# Patient Record
Sex: Female | Born: 1935 | Race: White | Hispanic: No | Smoking: Former smoker
Health system: Southern US, Community
[De-identification: ages and names within clinical notes are randomized; demographics above are authoritative.]

## PROBLEM LIST (undated history)

## (undated) DIAGNOSIS — R5381 Other malaise: Secondary | ICD-10-CM

## (undated) DIAGNOSIS — I5032 Chronic diastolic (congestive) heart failure: Secondary | ICD-10-CM

## (undated) DIAGNOSIS — M8000XA Age-related osteoporosis with current pathological fracture, unspecified site, initial encounter for fracture: Secondary | ICD-10-CM

## (undated) DIAGNOSIS — M545 Low back pain, unspecified: Secondary | ICD-10-CM

## (undated) DIAGNOSIS — I4892 Unspecified atrial flutter: Secondary | ICD-10-CM

## (undated) DIAGNOSIS — M4714 Other spondylosis with myelopathy, thoracic region: Secondary | ICD-10-CM

## (undated) DIAGNOSIS — S32810A Multiple fractures of pelvis with stable disruption of pelvic ring, initial encounter for closed fracture: Secondary | ICD-10-CM

## (undated) DIAGNOSIS — G959 Disease of spinal cord, unspecified: Secondary | ICD-10-CM

## (undated) DIAGNOSIS — I4891 Unspecified atrial fibrillation: Secondary | ICD-10-CM

## (undated) DIAGNOSIS — S2249XA Multiple fractures of ribs, unspecified side, initial encounter for closed fracture: Secondary | ICD-10-CM

## (undated) DIAGNOSIS — R29898 Other symptoms and signs involving the musculoskeletal system: Secondary | ICD-10-CM

## (undated) DIAGNOSIS — R109 Unspecified abdominal pain: Secondary | ICD-10-CM

## (undated) DIAGNOSIS — S329XXD Fracture of unspecified parts of lumbosacral spine and pelvis, subsequent encounter for fracture with routine healing: Secondary | ICD-10-CM

## (undated) DIAGNOSIS — R5383 Other fatigue: Secondary | ICD-10-CM

## (undated) DIAGNOSIS — M791 Myalgia, unspecified site: Secondary | ICD-10-CM

## (undated) DIAGNOSIS — S81812A Laceration without foreign body, left lower leg, initial encounter: Secondary | ICD-10-CM

## (undated) DIAGNOSIS — M21372 Foot drop, left foot: Secondary | ICD-10-CM

## (undated) DIAGNOSIS — I493 Ventricular premature depolarization: Secondary | ICD-10-CM

## (undated) DIAGNOSIS — S32029A Unspecified fracture of second lumbar vertebra, initial encounter for closed fracture: Secondary | ICD-10-CM

## (undated) DIAGNOSIS — N319 Neuromuscular dysfunction of bladder, unspecified: Secondary | ICD-10-CM

## (undated) DIAGNOSIS — I4819 Other persistent atrial fibrillation: Secondary | ICD-10-CM

## (undated) DIAGNOSIS — R7989 Other specified abnormal findings of blood chemistry: Secondary | ICD-10-CM

## (undated) DIAGNOSIS — S81811A Laceration without foreign body, right lower leg, initial encounter: Secondary | ICD-10-CM

## (undated) DIAGNOSIS — IMO0002 Reserved for concepts with insufficient information to code with codable children: Secondary | ICD-10-CM

## (undated) DIAGNOSIS — I10 Essential (primary) hypertension: Secondary | ICD-10-CM

## (undated) DIAGNOSIS — D72829 Elevated white blood cell count, unspecified: Secondary | ICD-10-CM

## (undated) DIAGNOSIS — E46 Unspecified protein-calorie malnutrition: Secondary | ICD-10-CM

## (undated) DIAGNOSIS — D696 Thrombocytopenia, unspecified: Secondary | ICD-10-CM

## (undated) DIAGNOSIS — I2699 Other pulmonary embolism without acute cor pulmonale: Secondary | ICD-10-CM

## (undated) DIAGNOSIS — I8290 Acute embolism and thrombosis of unspecified vein: Secondary | ICD-10-CM

## (undated) DIAGNOSIS — R55 Syncope and collapse: Secondary | ICD-10-CM

## (undated) DIAGNOSIS — S32000A Wedge compression fracture of unspecified lumbar vertebra, initial encounter for closed fracture: Secondary | ICD-10-CM

## (undated) DIAGNOSIS — I272 Pulmonary hypertension, unspecified: Secondary | ICD-10-CM

## (undated) DIAGNOSIS — S2239XA Fracture of one rib, unspecified side, initial encounter for closed fracture: Secondary | ICD-10-CM

## (undated) DIAGNOSIS — M546 Pain in thoracic spine: Secondary | ICD-10-CM

## (undated) DIAGNOSIS — J90 Pleural effusion, not elsewhere classified: Secondary | ICD-10-CM

## (undated) DIAGNOSIS — M8080XA Other osteoporosis with current pathological fracture, unspecified site, initial encounter for fracture: Secondary | ICD-10-CM

## (undated) DIAGNOSIS — K5903 Drug induced constipation: Secondary | ICD-10-CM

## (undated) DIAGNOSIS — G8929 Other chronic pain: Secondary | ICD-10-CM

## (undated) DIAGNOSIS — E785 Hyperlipidemia, unspecified: Secondary | ICD-10-CM

## (undated) DIAGNOSIS — M21371 Foot drop, right foot: Secondary | ICD-10-CM

## (undated) DIAGNOSIS — Z5181 Encounter for therapeutic drug level monitoring: Secondary | ICD-10-CM

## (undated) DIAGNOSIS — S32001A Stable burst fracture of unspecified lumbar vertebra, initial encounter for closed fracture: Secondary | ICD-10-CM

## (undated) DIAGNOSIS — N3090 Cystitis, unspecified without hematuria: Secondary | ICD-10-CM

## (undated) DIAGNOSIS — K592 Neurogenic bowel, not elsewhere classified: Secondary | ICD-10-CM

## (undated) DIAGNOSIS — K5901 Slow transit constipation: Secondary | ICD-10-CM

## (undated) DIAGNOSIS — I951 Orthostatic hypotension: Secondary | ICD-10-CM

## (undated) DIAGNOSIS — F411 Generalized anxiety disorder: Secondary | ICD-10-CM

## (undated) DIAGNOSIS — E538 Deficiency of other specified B group vitamins: Secondary | ICD-10-CM

## (undated) DIAGNOSIS — E8809 Other disorders of plasma-protein metabolism, not elsewhere classified: Secondary | ICD-10-CM

## (undated) DIAGNOSIS — I491 Atrial premature depolarization: Secondary | ICD-10-CM

## (undated) DIAGNOSIS — F329 Major depressive disorder, single episode, unspecified: Secondary | ICD-10-CM

## (undated) DIAGNOSIS — I119 Hypertensive heart disease without heart failure: Secondary | ICD-10-CM

## (undated) DIAGNOSIS — S32020A Wedge compression fracture of second lumbar vertebra, initial encounter for closed fracture: Secondary | ICD-10-CM

## (undated) DIAGNOSIS — R002 Palpitations: Secondary | ICD-10-CM

## (undated) DIAGNOSIS — R52 Pain, unspecified: Secondary | ICD-10-CM

## (undated) DIAGNOSIS — E440 Moderate protein-calorie malnutrition: Secondary | ICD-10-CM

## (undated) DIAGNOSIS — K629 Disease of anus and rectum, unspecified: Secondary | ICD-10-CM

## (undated) DIAGNOSIS — K5909 Other constipation: Secondary | ICD-10-CM

## (undated) DIAGNOSIS — F32A Depression, unspecified: Secondary | ICD-10-CM

## (undated) DIAGNOSIS — A499 Bacterial infection, unspecified: Secondary | ICD-10-CM

## (undated) DIAGNOSIS — S329XXA Fracture of unspecified parts of lumbosacral spine and pelvis, initial encounter for closed fracture: Secondary | ICD-10-CM

## (undated) DIAGNOSIS — G4762 Sleep related leg cramps: Secondary | ICD-10-CM

## (undated) DIAGNOSIS — R413 Other amnesia: Secondary | ICD-10-CM

## (undated) HISTORY — DX: Other symptoms and signs involving the musculoskeletal system: R29.898

## (undated) HISTORY — DX: Essential (primary) hypertension: I10

## (undated) HISTORY — DX: Other osteoporosis with current pathological fracture, unspecified site, initial encounter for fracture: M80.80XA

## (undated) HISTORY — DX: Other malaise: R53.81

## (undated) HISTORY — DX: Acute embolism and thrombosis of unspecified vein: I82.90

## (undated) HISTORY — DX: Depression, unspecified: F32.A

## (undated) HISTORY — DX: Pain in thoracic spine: M54.6

## (undated) HISTORY — DX: Other pulmonary embolism without acute cor pulmonale: I26.99

## (undated) HISTORY — DX: Other constipation: K59.09

## (undated) HISTORY — DX: Drug induced constipation: K59.03

## (undated) HISTORY — DX: Low back pain, unspecified: M54.50

## (undated) HISTORY — DX: Other fatigue: R53.83

## (undated) HISTORY — DX: Atrial premature depolarization: I49.1

## (undated) HISTORY — DX: Foot drop, right foot: M21.372

## (undated) HISTORY — DX: Palpitations: R00.2

## (undated) HISTORY — DX: Other chronic pain: G89.29

## (undated) HISTORY — DX: Fracture of unspecified parts of lumbosacral spine and pelvis, subsequent encounter for fracture with routine healing: S32.9XXD

## (undated) HISTORY — DX: Pain, unspecified: R52

## (undated) HISTORY — DX: Foot drop, right foot: M21.371

## (undated) HISTORY — DX: Other amnesia: R41.3

## (undated) HISTORY — DX: Reserved for concepts with insufficient information to code with codable children: IMO0002

## (undated) HISTORY — DX: Generalized anxiety disorder: F41.1

## (undated) HISTORY — DX: Thrombocytopenia, unspecified: D69.6

## (undated) HISTORY — DX: Multiple fractures of pelvis with stable disruption of pelvic ring, initial encounter for closed fracture: S32.810A

## (undated) HISTORY — DX: Ventricular premature depolarization: I49.3

## (undated) HISTORY — DX: Unspecified atrial fibrillation: I48.91

## (undated) HISTORY — DX: Other disorders of plasma-protein metabolism, not elsewhere classified: E88.09

## (undated) HISTORY — DX: Unspecified protein-calorie malnutrition: E46

## (undated) HISTORY — DX: Myalgia, unspecified site: M79.10

## (undated) HISTORY — DX: Unspecified atrial flutter: I48.92

## (undated) HISTORY — DX: Laceration without foreign body, right lower leg, initial encounter: S81.811A

## (undated) HISTORY — DX: Age-related osteoporosis with current pathological fracture, unspecified site, initial encounter for fracture: M80.00XA

## (undated) HISTORY — DX: Pulmonary hypertension, unspecified: I27.20

## (undated) HISTORY — DX: Unspecified abdominal pain: R10.9

## (undated) HISTORY — DX: Laceration without foreign body, left lower leg, initial encounter: S81.812A

## (undated) HISTORY — DX: Slow transit constipation: K59.01

---
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
## 1999-09-19 ENCOUNTER — Other Ambulatory Visit: Admission: RE | Admit: 1999-09-19 | Discharge: 1999-09-19 | Payer: Self-pay | Admitting: Obstetrics and Gynecology

## 2000-09-08 ENCOUNTER — Encounter: Payer: Self-pay | Admitting: Obstetrics and Gynecology

## 2000-09-08 ENCOUNTER — Encounter: Admission: RE | Admit: 2000-09-08 | Discharge: 2000-09-08 | Payer: Self-pay | Admitting: Obstetrics and Gynecology

## 2000-09-16 ENCOUNTER — Encounter: Payer: Self-pay | Admitting: Obstetrics and Gynecology

## 2000-09-16 ENCOUNTER — Encounter: Admission: RE | Admit: 2000-09-16 | Discharge: 2000-09-16 | Payer: Self-pay | Admitting: Obstetrics and Gynecology

## 2000-10-28 ENCOUNTER — Encounter: Admission: RE | Admit: 2000-10-28 | Discharge: 2000-10-28 | Payer: Self-pay | Admitting: Obstetrics and Gynecology

## 2000-10-28 ENCOUNTER — Encounter: Payer: Self-pay | Admitting: Obstetrics and Gynecology

## 2001-07-20 ENCOUNTER — Ambulatory Visit (HOSPITAL_COMMUNITY): Admission: RE | Admit: 2001-07-20 | Discharge: 2001-07-20 | Payer: Self-pay | Admitting: Gastroenterology

## 2001-09-22 ENCOUNTER — Encounter: Admission: RE | Admit: 2001-09-22 | Discharge: 2001-09-22 | Payer: Self-pay | Admitting: Obstetrics and Gynecology

## 2001-09-22 ENCOUNTER — Encounter: Payer: Self-pay | Admitting: Obstetrics and Gynecology

## 2002-10-30 ENCOUNTER — Encounter: Admission: RE | Admit: 2002-10-30 | Discharge: 2002-10-30 | Payer: Self-pay | Admitting: Obstetrics and Gynecology

## 2002-10-30 ENCOUNTER — Encounter: Payer: Self-pay | Admitting: Obstetrics and Gynecology

## 2003-11-07 ENCOUNTER — Encounter: Admission: RE | Admit: 2003-11-07 | Discharge: 2003-11-07 | Payer: Self-pay | Admitting: Obstetrics and Gynecology

## 2004-10-21 ENCOUNTER — Emergency Department (HOSPITAL_COMMUNITY): Admission: EM | Admit: 2004-10-21 | Discharge: 2004-10-21 | Payer: Self-pay | Admitting: Family Medicine

## 2004-11-26 ENCOUNTER — Encounter: Admission: RE | Admit: 2004-11-26 | Discharge: 2004-11-26 | Payer: Self-pay | Admitting: Obstetrics and Gynecology

## 2005-11-24 ENCOUNTER — Emergency Department (HOSPITAL_COMMUNITY): Admission: EM | Admit: 2005-11-24 | Discharge: 2005-11-24 | Payer: Self-pay | Admitting: Family Medicine

## 2005-12-14 ENCOUNTER — Emergency Department (HOSPITAL_COMMUNITY): Admission: EM | Admit: 2005-12-14 | Discharge: 2005-12-14 | Payer: Self-pay | Admitting: Family Medicine

## 2006-01-15 ENCOUNTER — Encounter: Admission: RE | Admit: 2006-01-15 | Discharge: 2006-01-15 | Payer: Self-pay | Admitting: Obstetrics and Gynecology

## 2006-06-15 ENCOUNTER — Ambulatory Visit (HOSPITAL_COMMUNITY): Admission: RE | Admit: 2006-06-15 | Discharge: 2006-06-15 | Payer: Self-pay | Admitting: Ophthalmology

## 2007-01-18 ENCOUNTER — Encounter: Admission: RE | Admit: 2007-01-18 | Discharge: 2007-01-18 | Payer: Self-pay | Admitting: Obstetrics and Gynecology

## 2007-06-20 ENCOUNTER — Emergency Department (HOSPITAL_COMMUNITY): Admission: EM | Admit: 2007-06-20 | Discharge: 2007-06-20 | Payer: Self-pay | Admitting: Family Medicine

## 2008-01-20 ENCOUNTER — Encounter: Admission: RE | Admit: 2008-01-20 | Discharge: 2008-01-20 | Payer: Self-pay | Admitting: Obstetrics and Gynecology

## 2009-01-01 ENCOUNTER — Ambulatory Visit (HOSPITAL_COMMUNITY): Admission: RE | Admit: 2009-01-01 | Discharge: 2009-01-01 | Payer: Self-pay | Admitting: Obstetrics and Gynecology

## 2009-01-01 ENCOUNTER — Encounter (INDEPENDENT_AMBULATORY_CARE_PROVIDER_SITE_OTHER): Payer: Self-pay | Admitting: Obstetrics and Gynecology

## 2009-01-01 HISTORY — PX: VULVAR LESION REMOVAL: SHX5391

## 2009-01-21 ENCOUNTER — Encounter: Admission: RE | Admit: 2009-01-21 | Discharge: 2009-01-21 | Payer: Self-pay | Admitting: Obstetrics and Gynecology

## 2009-10-18 ENCOUNTER — Encounter: Admission: RE | Admit: 2009-10-18 | Discharge: 2009-10-18 | Payer: Self-pay | Admitting: Internal Medicine

## 2010-01-23 ENCOUNTER — Encounter: Admission: RE | Admit: 2010-01-23 | Discharge: 2010-01-23 | Payer: Self-pay | Admitting: Obstetrics and Gynecology

## 2010-05-23 ENCOUNTER — Encounter: Admission: RE | Admit: 2010-05-23 | Discharge: 2010-05-23 | Payer: Self-pay | Admitting: Family Medicine

## 2010-05-27 ENCOUNTER — Encounter: Admission: RE | Admit: 2010-05-27 | Discharge: 2010-05-27 | Payer: Self-pay | Admitting: Family Medicine

## 2010-07-09 ENCOUNTER — Ambulatory Visit: Payer: Self-pay | Admitting: Cardiology

## 2010-08-08 ENCOUNTER — Ambulatory Visit: Payer: Self-pay | Admitting: Cardiology

## 2010-09-02 ENCOUNTER — Ambulatory Visit: Payer: Self-pay | Admitting: Cardiology

## 2010-10-03 ENCOUNTER — Ambulatory Visit: Payer: Self-pay | Admitting: Cardiology

## 2010-11-04 ENCOUNTER — Ambulatory Visit: Payer: Self-pay | Admitting: Cardiovascular Disease

## 2010-12-03 ENCOUNTER — Ambulatory Visit: Payer: Self-pay | Admitting: Cardiology

## 2011-01-05 ENCOUNTER — Encounter (INDEPENDENT_AMBULATORY_CARE_PROVIDER_SITE_OTHER): Payer: Medicare Other

## 2011-01-05 DIAGNOSIS — I4891 Unspecified atrial fibrillation: Secondary | ICD-10-CM

## 2011-01-05 DIAGNOSIS — Z7901 Long term (current) use of anticoagulants: Secondary | ICD-10-CM

## 2011-01-28 ENCOUNTER — Other Ambulatory Visit: Payer: Self-pay | Admitting: Dermatology

## 2011-02-02 ENCOUNTER — Other Ambulatory Visit (INDEPENDENT_AMBULATORY_CARE_PROVIDER_SITE_OTHER): Payer: Medicare Other

## 2011-02-02 DIAGNOSIS — Z7901 Long term (current) use of anticoagulants: Secondary | ICD-10-CM

## 2011-02-02 DIAGNOSIS — I4891 Unspecified atrial fibrillation: Secondary | ICD-10-CM

## 2011-03-09 ENCOUNTER — Encounter: Payer: Self-pay | Admitting: Cardiology

## 2011-03-09 DIAGNOSIS — I4892 Unspecified atrial flutter: Secondary | ICD-10-CM | POA: Insufficient documentation

## 2011-03-09 DIAGNOSIS — I493 Ventricular premature depolarization: Secondary | ICD-10-CM | POA: Insufficient documentation

## 2011-03-09 DIAGNOSIS — R5383 Other fatigue: Secondary | ICD-10-CM | POA: Insufficient documentation

## 2011-03-09 DIAGNOSIS — I491 Atrial premature depolarization: Secondary | ICD-10-CM | POA: Insufficient documentation

## 2011-03-09 DIAGNOSIS — R002 Palpitations: Secondary | ICD-10-CM | POA: Insufficient documentation

## 2011-03-09 DIAGNOSIS — M791 Myalgia, unspecified site: Secondary | ICD-10-CM | POA: Insufficient documentation

## 2011-03-09 DIAGNOSIS — R5381 Other malaise: Secondary | ICD-10-CM | POA: Insufficient documentation

## 2011-03-10 ENCOUNTER — Encounter: Payer: Self-pay | Admitting: Cardiology

## 2011-03-10 ENCOUNTER — Ambulatory Visit (INDEPENDENT_AMBULATORY_CARE_PROVIDER_SITE_OTHER): Payer: Medicare Other | Admitting: Cardiology

## 2011-03-10 DIAGNOSIS — I119 Hypertensive heart disease without heart failure: Secondary | ICD-10-CM

## 2011-03-10 DIAGNOSIS — E7800 Pure hypercholesterolemia, unspecified: Secondary | ICD-10-CM

## 2011-03-10 DIAGNOSIS — Z7901 Long term (current) use of anticoagulants: Secondary | ICD-10-CM

## 2011-03-10 DIAGNOSIS — R002 Palpitations: Secondary | ICD-10-CM

## 2011-03-10 DIAGNOSIS — I4892 Unspecified atrial flutter: Secondary | ICD-10-CM

## 2011-03-10 DIAGNOSIS — E785 Hyperlipidemia, unspecified: Secondary | ICD-10-CM | POA: Insufficient documentation

## 2011-03-10 HISTORY — DX: Hypertensive heart disease without heart failure: I11.9

## 2011-03-10 HISTORY — DX: Unspecified atrial flutter: I48.92

## 2011-03-10 HISTORY — DX: Hyperlipidemia, unspecified: E78.5

## 2011-03-10 LAB — POCT INR: INR: 2.6

## 2011-03-10 NOTE — Progress Notes (Signed)
History of Present Illness: This pleasant 77 year old woman is seen for a scheduled 3 month followup office visit.  She has a history of paroxysmal atrial flutter.  She is no longer on amiodarone but she has not reverted back into atrial flutter either.  She walks daily for exercise and has good exercise tolerance and no chest pain or shortness of breath.  She is controlling her diet keep her cholesterol low  Current Outpatient Prescriptions  Medication Sig Dispense Refill  . calcitonin, salmon, (MIACALCIN/FORTICAL) 200 UNIT/ACT nasal spray 1 spray by Nasal route daily.        . Calcium Carbonate-Vitamin D (CALCIUM + D PO) Take by mouth 2 (two) times daily.        . Omega-3 Fatty Acids (FISH OIL PO) Take 2,000 mg by mouth 2 (two) times daily.        . ramipril (ALTACE) 5 MG tablet Take 5 mg by mouth daily.        Marland Kitchen warfarin (COUMADIN) 5 MG tablet Take 5 mg by mouth daily. AS DIRECTED       . DISCONTD: aspirin 81 MG tablet Take 81 mg by mouth daily.         Allergies  Allergen Reactions  . Crestor (Rosuvastatin Calcium)     Patient Active Problem List  Diagnoses  . Paroxysmal atrial flutter  . Palpitations  . PAC (premature atrial contraction)  . PVC's (premature ventricular contractions)  . Malaise  . Fatigue  . Myalgia  . Atrial flutter  . Encounter for long-term (current) use of anticoagulants  . Benign hypertensive heart disease without heart failure  . Hypercholesterolemia    History  Smoking status  . Former Smoker  . Quit date: 03/09/1991  Smokeless tobacco  . Not on file    History  Alcohol Use No    History reviewed. No pertinent family history.  Review of Systems: Constitutional: no fever chills diaphoresis or fatigue or change in weight.  Head and neck: no hearing loss, no epistaxis, no photophobia or visual disturbance. Respiratory: No cough, shortness of breath or wheezing. Cardiovascular: No chest pain peripheral edema, palpitations. Gastrointestinal:  No abdominal distention, no abdominal pain, no change in bowel habits hematochezia or melena. Genitourinary: No dysuria, no frequency, no urgency, no nocturia. Musculoskeletal:No arthralgias, no back pain, no gait disturbance or myalgias. Neurological: No dizziness, no headaches, no numbness, no seizures, no syncope, no weakness, no tremors. Hematologic: No lymphadenopathy, no easy bruising. Psychiatric: No confusion, no hallucinations, no sleep disturbance.    Physical Exam: Filed Vitals:   03/10/11 1022  BP: 110/70  Pulse: 64  Weight is 108 pounds, up 2 pounds.Pupils equal and reactive.   Extraocular Movements are full.  There is no scleral icterus.  The mouth and pharynx are normal.  The neck is supple.  The carotids reveal no bruits.  The jugular venous pressure is normal.  The thyroid is not enlarged.  There is no lymphadenopathy.The chest is clear to percussion and auscultation. There are no rales or rhonchi. Expansion of the chest is symmetrical.The precordium is quiet.  The first heart sound is normal.  The second heart sound is physiologically split.  There is no murmur gallop rub or click.  There is no abnormal lift or heave.The abdomen is soft and nontender. Bowel sounds are normal. The liver and spleen are not enlarged. There Are no abdominal masses. There are no bruits.The pedal pulses are good.  There is no phlebitis or edema.  There is no  cyanosis or clubbing.Strength is normal and symmetrical in all extremities.  There is no lateralizing weakness.  There are no sensory deficits.   Assessment / Plan:  INR is 2.6 and she'll continue same dose.  Recheck for monthly protimes and see for office visit and 3 months

## 2011-03-10 NOTE — Assessment & Plan Note (Signed)
Since last visit the patient has been doing well.  She's not been aware of any rapid heart action or palpitations.  She continues to walk daily for exercise.  She denies chest pain or shortness of breath dizziness or syncope.

## 2011-03-10 NOTE — Assessment & Plan Note (Signed)
Her blood pressure is remaining stable on low dose ramipril 5 mg daily.

## 2011-03-10 NOTE — Assessment & Plan Note (Signed)
The patient has a past history of elevated cholesterol.  She was unable to tolerate Crestor because of leg cramps.  She still has occasional leg cramps off Crestor.  She is trying to watch her diet in regard to her cholesterol intake

## 2011-04-07 ENCOUNTER — Ambulatory Visit (INDEPENDENT_AMBULATORY_CARE_PROVIDER_SITE_OTHER): Payer: Medicare Other | Admitting: *Deleted

## 2011-04-07 DIAGNOSIS — Z7901 Long term (current) use of anticoagulants: Secondary | ICD-10-CM

## 2011-04-07 DIAGNOSIS — I4892 Unspecified atrial flutter: Secondary | ICD-10-CM

## 2011-04-07 LAB — POCT INR: INR: 2.6

## 2011-04-14 NOTE — Op Note (Signed)
NAMEJAMYAH, [name] Weber             ACCOUNT NO.:  [id number]   MEDICAL RECORD NO.:  [id number]          PATIENT TYPE:  AMB   LOCATION:  [facility]                           FACILITY:  [facility]   PHYSICIAN:  Zenaida Niece, M.D.DATE OF BIRTH:  1931/01/20   DATE OF PROCEDURE:  DATE OF DISCHARGE:                               OPERATIVE REPORT   PREOPERATIVE DIAGNOSIS:  Vulvar lesion.   POSTOPERATIVE DIAGNOSIS:  Vulvar lesion.   PROCEDURE:  Removal of vulvar lesion.   SURGEON:  Zenaida Niece, MD   ANESTHESIA:  Local block.   FINDINGS:  She had a 1-cm area of erythema to the right of the urethral  meatus.   SPECIMENS:  Vulvar lesion sent for routine pathology.   ESTIMATED BLOOD LOSS:  Minimal.   COMPLICATIONS:  None.   PROCEDURE IN DETAIL:  The patient was taken to the operating room and  placed in the dorsal supine position.  Legs were then placed in mobile  stirrups.  Perineum and external vagina were then prepped and draped in  usual sterile fashion.  The area was inspected.  Marcaine 0.5% with  epinephrine was then instilled beneath the lesion that was irregular and  just to the right of the urethral meatus.  This achieved good  anesthesia.  The lesion was then removed sharply with a scalpel with  none of the lesion left.  It did appear to be shallow.  The defect was  then closed with interrupted figure-of-eight sutures of 3-0 Vicryl  careful to avoid closing the urethra.  Prior to closure, bleeding was  controlled with electrocautery.  At the end of procedure, the lesion was  hemostatic and well approximated.  The patient tolerated the procedure  well.  She was taken down from the stirrups and taken to the recovery  room in stable condition.  Counts were correct.      Zenaida Niece, M.D.  Electronically Signed     TDM/MEDQ  D:  01/01/2009  T:  01/02/2009  Job:  [id number]

## 2011-04-17 NOTE — Procedures (Signed)
[facility]  Patient:    [name] Weber, [name] Weber Visit Number: [id number] MRN: [id number]          Service Type: END Location: ENDO Attending Physician:  Charna Elizabeth Proc. Date: 07/20/01 Adm. Date:  07/20/2001   CC:         Lilly Cove, M.D.   Procedure Report  DATE OF BIRTH:  October 11, 1931.  PROCEDURE:  Colonoscopy.  ENDOSCOPIST:  Anselmo Rod, M.D.  INSTRUMENT USED:  Pediatric Olympus colonoscope.  INDICATION FOR PROCEDURE:  A 77 year old white female undergoing a screening colonoscopy to rule out colonic polyps, masses, hemorrhoids, etc.  PREPROCEDURE PREPARATION:  Informed consent was procured from the patient. The patient was fasted for eight hours prior to the procedure and prepped with a bottle of magnesium citrate and a gallon of NuLytely the night prior to the procedure.  PREPROCEDURE PHYSICAL:  VITAL SIGNS:  The patient had stable vital signs.  NECK:  Supple.  CHEST:  Clear to auscultation.  S1, S2 regular.  ABDOMEN:  Soft with normal bowel sounds.  DESCRIPTION OF PROCEDURE:  The patient was placed in the left lateral decubitus position and sedated with 60 mg of Demerol and 6 mg of Versed intravenously.  Once the patient was adequately sedate and maintained on low-flow oxygen and continuous cardiac monitoring, the Olympus video colonoscope was advanced from the rectum to the cecum with difficulty, as the patient had a very tortuous and atonic colon.  There were left-sided diverticula seen with small internal hemorrhoids.  No other abnormalities were appreciated.  The procedure was complete up to the cecum.  The ileocecal valve and the appendiceal orifice were clearly visualized and photographed.  IMPRESSION: 1. Left-sided diverticulosis. 2. Small internal hemorrhoids. 3. Very tortuous and atonic colon.  RECOMMENDATIONS: 1. A high-fiber diet has been recommended for the patient. 2. Repeat colorectal cancer  screening is recommended in the next 10 years    unless she were to develop any abnormal symptoms in the interim. 3. Outpatient follow-up is advised on a p.r.n. basis. Attending Physician:  Charna Elizabeth DD:  07/20/01 TD:  07/21/01 Job: [id number]

## 2011-05-05 ENCOUNTER — Ambulatory Visit (INDEPENDENT_AMBULATORY_CARE_PROVIDER_SITE_OTHER): Payer: Medicare Other | Admitting: *Deleted

## 2011-05-05 DIAGNOSIS — I4892 Unspecified atrial flutter: Secondary | ICD-10-CM

## 2011-05-05 DIAGNOSIS — Z7901 Long term (current) use of anticoagulants: Secondary | ICD-10-CM

## 2011-05-05 LAB — POCT INR: INR: 2.8

## 2011-06-01 ENCOUNTER — Ambulatory Visit (INDEPENDENT_AMBULATORY_CARE_PROVIDER_SITE_OTHER): Payer: Medicare Other | Admitting: Cardiology

## 2011-06-01 ENCOUNTER — Encounter: Payer: Self-pay | Admitting: Cardiology

## 2011-06-01 ENCOUNTER — Ambulatory Visit (INDEPENDENT_AMBULATORY_CARE_PROVIDER_SITE_OTHER): Payer: Medicare Other | Admitting: *Deleted

## 2011-06-01 ENCOUNTER — Ambulatory Visit
Admission: RE | Admit: 2011-06-01 | Discharge: 2011-06-01 | Disposition: A | Discharge status: Home / Self Care | Payer: Medicare Other | Source: Ambulatory Visit | Attending: Cardiology | Admitting: Cardiology

## 2011-06-01 DIAGNOSIS — R5383 Other fatigue: Secondary | ICD-10-CM | POA: Insufficient documentation

## 2011-06-01 DIAGNOSIS — R071 Chest pain on breathing: Secondary | ICD-10-CM

## 2011-06-01 DIAGNOSIS — R110 Nausea: Secondary | ICD-10-CM

## 2011-06-01 DIAGNOSIS — R0789 Other chest pain: Secondary | ICD-10-CM

## 2011-06-01 DIAGNOSIS — R350 Frequency of micturition: Secondary | ICD-10-CM

## 2011-06-01 DIAGNOSIS — Z7901 Long term (current) use of anticoagulants: Secondary | ICD-10-CM

## 2011-06-01 DIAGNOSIS — R5381 Other malaise: Secondary | ICD-10-CM

## 2011-06-01 DIAGNOSIS — I4892 Unspecified atrial flutter: Secondary | ICD-10-CM

## 2011-06-01 DIAGNOSIS — R531 Weakness: Secondary | ICD-10-CM

## 2011-06-01 LAB — T4, FREE: Free T4: 0.97 ng/dL (ref 0.60–1.60)

## 2011-06-01 LAB — HEPATIC FUNCTION PANEL
ALT: 20 U/L (ref 0–35)
AST: 25 U/L (ref 0–37)
Albumin: 4 g/dL (ref 3.5–5.2)
Alkaline Phosphatase: 61 U/L (ref 39–117)
Bilirubin, Direct: 0 mg/dL (ref 0.0–0.3)
Total Bilirubin: 0.5 mg/dL (ref 0.3–1.2)
Total Protein: 7.3 g/dL (ref 6.0–8.3)

## 2011-06-01 LAB — BASIC METABOLIC PANEL
BUN: 16 mg/dL (ref 6–23)
CO2: 33 mEq/L — ABNORMAL HIGH (ref 19–32)
Calcium: 9.5 mg/dL (ref 8.4–10.5)
Chloride: 100 mEq/L (ref 96–112)
Creatinine, Ser: 0.8 mg/dL (ref 0.4–1.2)
GFR: 74.89 mL/min (ref >60.00)
Glucose, Bld: 99 mg/dL (ref 70–99)
Potassium: 5.3 mEq/L — ABNORMAL HIGH (ref 3.5–5.1)
Sodium: 138 mEq/L (ref 135–145)

## 2011-06-01 LAB — CBC WITH DIFFERENTIAL/PLATELET
Basophils Absolute: 0 10*3/uL (ref 0.0–0.1)
Basophils Relative: 0.3 % (ref 0.0–3.0)
Eosinophils Absolute: 0 10*3/uL (ref 0.0–0.7)
Eosinophils Relative: 0.6 % (ref 0.0–5.0)
HCT: 43.9 % (ref 36.0–46.0)
Hemoglobin: 14.9 g/dL (ref 12.0–15.0)
Lymphocytes Relative: 23.3 % (ref 12.0–46.0)
Lymphs Abs: 1.7 10*3/uL (ref 0.7–4.0)
MCHC: 34.1 g/dL (ref 30.0–36.0)
MCV: 97.5 fl (ref 78.0–100.0)
Monocytes Absolute: 0.7 10*3/uL (ref 0.1–1.0)
Monocytes Relative: 9.4 % (ref 3.0–12.0)
Neutro Abs: 4.8 10*3/uL (ref 1.4–7.7)
Neutrophils Relative %: 66.4 % (ref 43.0–77.0)
Platelets: 183 10*3/uL (ref 150.0–400.0)
RBC: 4.5 Mil/uL (ref 3.87–5.11)
RDW: 12.5 % (ref 11.5–14.6)
WBC: 7.3 10*3/uL (ref 4.5–10.5)

## 2011-06-01 LAB — TSH: TSH: 1.69 u[IU]/mL (ref 0.35–5.50)

## 2011-06-01 LAB — POCT INR: INR: 3.5

## 2011-06-01 NOTE — Progress Notes (Signed)
[name] Weber Date of Birth:  [birth year] [facility] / [facility] [address]   [address] [place], [place]  [address] [phone]           Fax   [phone]  History of Present Illness: This pleasant 77 year old woman is seen for a work in office visit.  She has a past history of paroxysmal atrial flutter.  She is on Coumadin to prevent stroke.  She's had a history of hypercholesterolemia but was unable to tolerate statins because of myalgias.  She has not had any TIA symptoms.  She does not have any history of ischemic heart disease and she had a normal nuclear stress test on 12/15/02.  Her last echocardiogram was 11/25/06 and showed normal left ventricular systolic and diastolic function with mild aortic sclerosis and mitral valve prolapse with mild mitral regurgitation.  She had normal pulmonary artery pressure of 30.  She was doing well until 4 days ago when she began to have nonspecific symptoms of malaise fatigue arthralgias and poor appetite.  Current Outpatient Prescriptions  Medication Sig Dispense Refill  . aspirin 81 MG tablet Take 81 mg by mouth daily.        . calcitonin, salmon, (MIACALCIN/FORTICAL) 200 UNIT/ACT nasal spray 1 spray by Nasal route daily.        . Calcium Carbonate-Vitamin D (CALCIUM + D PO) Take by mouth 2 (two) times daily.        . Omega-3 Fatty Acids (FISH OIL PO) Take 2,000 mg by mouth 2 (two) times daily.        . ramipril (ALTACE) 5 MG tablet Take 5 mg by mouth daily.        Marland Kitchen warfarin (COUMADIN) 5 MG tablet Take 5 mg by mouth daily. AS DIRECTED         Allergies  Allergen Reactions  . Crestor (Rosuvastatin Calcium)     Patient Active Problem List  Diagnoses  . Paroxysmal atrial flutter  . Palpitations  . PAC (premature atrial contraction)  . PVC's (premature ventricular contractions)  . Malaise  . Fatigue  . Myalgia  . Atrial flutter  . Encounter for long-term (current) use of anticoagulants  . Benign hypertensive heart  disease without heart failure  . Hypercholesterolemia  . Malaise and fatigue    History  Smoking status  . Former Smoker  . Quit date: 03/09/1991  Smokeless tobacco  . Not on file    History  Alcohol Use No    No family history on file.  Review of Systems: Constitutional: Patient has had further weight loss since last visit.  No fever but has felt chilly Head and neck: no hearing loss, no epistaxis, no photophobia or visual disturbance. Respiratory: No cough, shortness of breath or wheezing. Cardiovascular: No chest pain peripheral edema, palpitations. Gastrointestinal: No abdominal distention, no abdominal pain, no change in bowel habits hematochezia or melena.Mild nausea but no vomiting Genitourinary: The patient has had urinary frequency during the day but no increase in nocturia at night Musculoskeletal:No arthralgias, no back pain, no gait disturbance or myalgias. Neurological: No dizziness, no headaches, no numbness, no seizures, no syncope, no weakness, no tremors. Hematologic: No lymphadenopathy, no easy bruising. Psychiatric: No confusion, no hallucinations, no sleep disturbance.    Physical Exam: Filed Vitals:   06/01/11 1030  BP: 110/80  Pulse: 80  The general appearance reveals a well-developed well-nourished thin woman in no distress.The head and neck exam reveals pupils equal and reactive.  Extraocular movements are  full.  There is no scleral icterus.  The mouth and pharynx are normal.  The neck is supple.  The carotids reveal no bruits.  The jugular venous pressure is normal.  The  thyroid is not enlarged.  There is no lymphadenopathy.  The chest is clear to percussion and auscultation.  There are no rales or rhonchi.  Expansion of the chest is symmetrical.  The precordium is quiet.  The first heart sound is normal.  The second heart sound is physiologically split.  There is no murmur gallop rub or click.  There is no abnormal lift or heave.  The abdomen is soft  and nontender.  The bowel sounds are normal.  The liver and spleen are not enlarged.  There are no abdominal masses.  There are no abdominal bruits.  Extremities reveal good pedal pulses.  There is no phlebitis or edema.  There is no cyanosis or clubbing.  Strength is normal and symmetrical in all extremities.  There is no lateralizing weakness.  There are no sensory deficits.  The skin is warm and dry.  There is no rash.   Assessment / Plan: We're checking a chest x-ray today as well as lab work including CBC urinalysis hepatic function panel basal metabolic panel and thyroid function studies since she does complain of feeling nervous and has lost more weight.  We're also checking her protime today.

## 2011-06-01 NOTE — Assessment & Plan Note (Signed)
The patient comes in ahead of schedule because she has not been feeling well for the past 4 days.  She has felt cold.  She has had no definite fever.  She's been having joint aches.  She's had a slight headache.  There is no history of tick bite.  She's been having cramps in her feet.  She's had a poor appetite and has some loose stools but no vomiting.  She has had some slight nausea.  She's had some right-sided chest pain but no left-sided chest pain.  She has felt extremely nervous and anxious.  His not coughing up any sputum.  Her illness started as she was returning home from a family get together in [place].  She is not aware of any other family members who became ill.

## 2011-06-02 ENCOUNTER — Telehealth: Payer: Self-pay | Admitting: Cardiology

## 2011-06-02 LAB — URINALYSIS W MICROSCOPIC + REFLEX CULTURE
Bilirubin Urine: NEGATIVE
Casts: NONE SEEN
Crystals: NONE SEEN
Glucose, UA: NEGATIVE mg/dL
Ketones, ur: NEGATIVE mg/dL
Nitrite: POSITIVE — AB
Protein, ur: NEGATIVE mg/dL
Specific Gravity, Urine: 1.009 (ref 1.005–1.030)
Squamous Epithelial / HPF: NONE SEEN
Urobilinogen, UA: 0.2 mg/dL (ref 0.0–1.0)
pH: 8 (ref 5.0–8.0)

## 2011-06-02 NOTE — Telephone Encounter (Signed)
TEST RESULTS that were done yesterday (06/01/11)

## 2011-06-02 NOTE — Telephone Encounter (Signed)
Message copied by Burnell Blanks on Tue Jun 02, 2011 10:03 AM ------      Message from: Cassell Clement      Created: Mon Jun 01, 2011 10:04 PM       Please report.  The thyroid function studies are normal.The liver function studies are normal.The kidney function studies are normal.White count and hemoglobin are normal and there is no anemia.

## 2011-06-02 NOTE — Telephone Encounter (Signed)
Culture back on urine, positive for nitrates but will wait for sensitivity back before rx anything.  Per patient request

## 2011-06-02 NOTE — Telephone Encounter (Signed)
Message copied by Burnell Blanks on Tue Jun 02, 2011 10:02 AM ------      Message from: Cassell Clement      Created: Mon Jun 01, 2011 10:05 PM       Please report.  The chest x-ray shows mild cardiomegaly and clear lungs and no cause for her symptoms

## 2011-06-02 NOTE — Telephone Encounter (Signed)
Advised of chest xray and labs

## 2011-06-03 LAB — URINE CULTURE: Colony Count: >=100000

## 2011-06-09 ENCOUNTER — Telehealth: Payer: Self-pay | Admitting: *Deleted

## 2011-06-09 DIAGNOSIS — N390 Urinary tract infection, site not specified: Secondary | ICD-10-CM

## 2011-06-09 MED ORDER — AMOXICILLIN 500 MG PO CAPS: 500.0000 mg | ORAL_CAPSULE | Freq: Three times a day (TID) | ORAL | Status: AC

## 2011-06-09 NOTE — Telephone Encounter (Signed)
Message copied by Burnell Blanks on Tue Jun 09, 2011  9:27 AM ------      Message from: Cassell Clement      Created: Mon Jun 08, 2011  5:20 PM       Please report.  The urinalysis and culture show an infection.  We will Rx amoxicillin 500 mg 3 times a day for 5 days #15.  She should force fluids

## 2011-06-09 NOTE — Progress Notes (Signed)
Advised of labs 

## 2011-06-09 NOTE — Telephone Encounter (Signed)
Advised of u/a results, call back if no better

## 2011-06-16 ENCOUNTER — Ambulatory Visit (INDEPENDENT_AMBULATORY_CARE_PROVIDER_SITE_OTHER): Payer: Medicare Other | Admitting: *Deleted

## 2011-06-16 DIAGNOSIS — I4892 Unspecified atrial flutter: Secondary | ICD-10-CM

## 2011-06-16 DIAGNOSIS — Z7901 Long term (current) use of anticoagulants: Secondary | ICD-10-CM

## 2011-06-16 LAB — POCT INR: INR: 2.5

## 2011-07-14 ENCOUNTER — Encounter: Payer: Medicare Other | Admitting: *Deleted

## 2011-07-14 ENCOUNTER — Ambulatory Visit (INDEPENDENT_AMBULATORY_CARE_PROVIDER_SITE_OTHER): Payer: Medicare Other | Admitting: *Deleted

## 2011-07-14 DIAGNOSIS — Z7901 Long term (current) use of anticoagulants: Secondary | ICD-10-CM

## 2011-07-14 DIAGNOSIS — I4892 Unspecified atrial flutter: Secondary | ICD-10-CM

## 2011-07-14 LAB — POCT INR: INR: 2

## 2011-07-15 ENCOUNTER — Telehealth: Payer: Self-pay | Admitting: Cardiology

## 2011-07-15 NOTE — Telephone Encounter (Signed)
Needs latest labs faxed

## 2011-07-15 NOTE — Telephone Encounter (Signed)
Faxed latest labs today

## 2011-07-24 ENCOUNTER — Encounter: Payer: Self-pay | Admitting: Cardiology

## 2011-08-11 ENCOUNTER — Ambulatory Visit (INDEPENDENT_AMBULATORY_CARE_PROVIDER_SITE_OTHER): Payer: Medicare Other | Admitting: *Deleted

## 2011-08-11 DIAGNOSIS — Z7901 Long term (current) use of anticoagulants: Secondary | ICD-10-CM

## 2011-08-11 DIAGNOSIS — I4892 Unspecified atrial flutter: Secondary | ICD-10-CM

## 2011-08-11 LAB — POCT INR: INR: 1.1

## 2011-08-21 ENCOUNTER — Ambulatory Visit (INDEPENDENT_AMBULATORY_CARE_PROVIDER_SITE_OTHER): Payer: Medicare Other | Admitting: *Deleted

## 2011-08-21 DIAGNOSIS — I4892 Unspecified atrial flutter: Secondary | ICD-10-CM

## 2011-08-21 DIAGNOSIS — Z7901 Long term (current) use of anticoagulants: Secondary | ICD-10-CM

## 2011-08-21 LAB — POCT INR: INR: 1.1

## 2011-08-28 ENCOUNTER — Ambulatory Visit (INDEPENDENT_AMBULATORY_CARE_PROVIDER_SITE_OTHER): Payer: Medicare Other | Admitting: *Deleted

## 2011-08-28 DIAGNOSIS — I4892 Unspecified atrial flutter: Secondary | ICD-10-CM

## 2011-08-28 DIAGNOSIS — Z7901 Long term (current) use of anticoagulants: Secondary | ICD-10-CM

## 2011-08-28 LAB — POCT INR: INR: 2

## 2011-09-21 ENCOUNTER — Ambulatory Visit (INDEPENDENT_AMBULATORY_CARE_PROVIDER_SITE_OTHER): Payer: Medicare Other | Admitting: *Deleted

## 2011-09-21 DIAGNOSIS — Z7901 Long term (current) use of anticoagulants: Secondary | ICD-10-CM

## 2011-09-21 DIAGNOSIS — I4892 Unspecified atrial flutter: Secondary | ICD-10-CM

## 2011-09-21 LAB — POCT INR: INR: 2.3

## 2011-10-14 ENCOUNTER — Ambulatory Visit (INDEPENDENT_AMBULATORY_CARE_PROVIDER_SITE_OTHER): Payer: Medicare Other | Admitting: *Deleted

## 2011-10-14 ENCOUNTER — Encounter: Payer: Self-pay | Admitting: Cardiology

## 2011-10-14 ENCOUNTER — Ambulatory Visit (INDEPENDENT_AMBULATORY_CARE_PROVIDER_SITE_OTHER): Payer: Medicare Other | Admitting: Cardiology

## 2011-10-14 VITALS — BP 128/80 | HR 80 | Ht 68.0 in | Wt 109.0 lb

## 2011-10-14 DIAGNOSIS — R002 Palpitations: Secondary | ICD-10-CM

## 2011-10-14 DIAGNOSIS — I4891 Unspecified atrial fibrillation: Secondary | ICD-10-CM

## 2011-10-14 DIAGNOSIS — M810 Age-related osteoporosis without current pathological fracture: Secondary | ICD-10-CM

## 2011-10-14 DIAGNOSIS — I119 Hypertensive heart disease without heart failure: Secondary | ICD-10-CM

## 2011-10-14 DIAGNOSIS — Z7901 Long term (current) use of anticoagulants: Secondary | ICD-10-CM

## 2011-10-14 DIAGNOSIS — I341 Nonrheumatic mitral (valve) prolapse: Secondary | ICD-10-CM

## 2011-10-14 DIAGNOSIS — E7800 Pure hypercholesterolemia, unspecified: Secondary | ICD-10-CM

## 2011-10-14 DIAGNOSIS — I059 Rheumatic mitral valve disease, unspecified: Secondary | ICD-10-CM

## 2011-10-14 DIAGNOSIS — I4892 Unspecified atrial flutter: Secondary | ICD-10-CM

## 2011-10-14 LAB — POCT INR: INR: 2

## 2011-10-14 MED ORDER — CALCITONIN (SALMON) 200 UNIT/ACT NA SOLN: 1.0000 | Freq: Every day | NASAL | Status: DC

## 2011-10-14 NOTE — Assessment & Plan Note (Signed)
She has tried to stay on a prudent low cholesterol diet.  Her weight is up 1 pound since last visit.  He had been concerned about her weight loss.  Since we last saw her she had a colonoscopy by Dr. Loreta Ave which was normal.

## 2011-10-14 NOTE — Patient Instructions (Signed)
Your physician recommends that you continue on your current medications as directed. Please refer to the Current Medication list given to you today. Your physician recommends that you schedule a follow-up appointment in: 4 months with fasting labs (LP/BMET/HFP/EKG)

## 2011-10-14 NOTE — Assessment & Plan Note (Signed)
No recent palpitations or tachycardia.

## 2011-10-14 NOTE — Progress Notes (Signed)
[name] Weber Date of Birth:  1930/12/11 [facility] / [facility] [address]   [address] [place], [place]  [address] [phone]           Fax   [phone]  HPI: This pleasant 77 year old woman is seen for a scheduled followup office visit.  She has a past history of paroxysmal atrial flutter.  She is on long-term Coumadin to prevent stroke.  She's had a history of hypercholesterolemia but cannot take statins because of myalgias.  She has not had any history of TIA symptoms.  She does not have any ischemic heart disease and had a normal nuclear stress test in 2004.  Her last echocardiogram in December 2007 showed normal left ventricular systolic and diastolic function with mild aortic sclerosis and mitral valve prolapse with mild mitral regurgitation.  Current Outpatient Prescriptions  Medication Sig Dispense Refill  . aspirin 81 MG tablet Take 81 mg by mouth daily.        . calcitonin, salmon, (MIACALCIN/FORTICAL) 200 UNIT/ACT nasal spray Place 1 spray into the nose daily.  3.7 mL  3  . Calcium Carbonate-Vitamin D (CALCIUM + D PO) Take by mouth 2 (two) times daily.        . Omega-3 Fatty Acids (FISH OIL PO) Take 2,000 mg by mouth 2 (two) times daily.        . ramipril (ALTACE) 5 MG tablet Take 5 mg by mouth daily.        Marland Kitchen warfarin (COUMADIN) 5 MG tablet Take 5 mg by mouth daily. AS DIRECTED         Allergies  Allergen Reactions  . Crestor (Rosuvastatin Calcium)     Patient Active Problem List  Diagnoses  . Paroxysmal atrial flutter  . Palpitations  . PAC (premature atrial contraction)  . PVC's (premature ventricular contractions)  . Malaise  . Fatigue  . Myalgia  . Atrial flutter  . Encounter for long-term (current) use of anticoagulants  . Benign hypertensive heart disease without heart failure  . Hypercholesterolemia  . Malaise and fatigue    History  Smoking status  . Former Smoker  . Quit date: 03/09/1991  Smokeless tobacco  . Not on file      History  Alcohol Use No    No family history on file.  Review of Systems: The patient denies any heat or cold intolerance.  No weight gain or weight loss.  The patient denies headaches or blurry vision.  There is no cough or sputum production.  The patient denies dizziness.  There is no hematuria or hematochezia.  The patient denies any muscle aches or arthritis.  The patient denies any rash.  The patient denies frequent falling or instability.  There is no history of depression or anxiety.  All other systems were reviewed and are negative.   Physical Exam: Filed Vitals:   10/14/11 1508  BP: 128/80  Pulse: 80   The patient appears to be in no distress.  Head and neck exam reveals that the pupils are equal and reactive.  The extraocular movements are full.  There is no scleral icterus.  Mouth and pharynx are benign.  No lymphadenopathy.  No carotid bruits.  The jugular venous pressure is normal.  Thyroid is not enlarged or tender.  Chest is clear to percussion and auscultation.  No rales or rhonchi.  Expansion of the chest is symmetrical.  Heart reveals no abnormal lift or heave.  First and second heart sounds are normal.  There is no murmur gallop rub and there is a sharp midsystolic apical click.  The abdomen is soft and nontender.  Bowel sounds are normoactive.  There is no hepatosplenomegaly or mass.  There are no abdominal bruits.  Extremities reveal no phlebitis or edema.  Pedal pulses are good.  There is no cyanosis or clubbing.  Neurologic exam is normal strength and no lateralizing weakness.  No sensory deficits.  Integument reveals no rash    Assessment / Plan: Continue same medication.  Recheck in 4 months for office visit EKG and fasting lab work

## 2011-10-14 NOTE — Assessment & Plan Note (Signed)
Her previous malaise and fatigue have improved since last visit.  She was walking outside regularly for exercise now

## 2011-11-25 ENCOUNTER — Ambulatory Visit (INDEPENDENT_AMBULATORY_CARE_PROVIDER_SITE_OTHER): Payer: Medicare Other | Admitting: *Deleted

## 2011-11-25 DIAGNOSIS — Z7901 Long term (current) use of anticoagulants: Secondary | ICD-10-CM

## 2011-11-25 DIAGNOSIS — I4892 Unspecified atrial flutter: Secondary | ICD-10-CM

## 2011-11-25 LAB — POCT INR: INR: 1.7

## 2011-12-10 ENCOUNTER — Ambulatory Visit (INDEPENDENT_AMBULATORY_CARE_PROVIDER_SITE_OTHER): Payer: Medicare Other | Admitting: *Deleted

## 2011-12-10 DIAGNOSIS — Z7901 Long term (current) use of anticoagulants: Secondary | ICD-10-CM

## 2011-12-10 DIAGNOSIS — I4892 Unspecified atrial flutter: Secondary | ICD-10-CM

## 2011-12-10 LAB — POCT INR: INR: 2.3

## 2011-12-31 ENCOUNTER — Ambulatory Visit (INDEPENDENT_AMBULATORY_CARE_PROVIDER_SITE_OTHER): Payer: Medicare Other

## 2011-12-31 DIAGNOSIS — Z7901 Long term (current) use of anticoagulants: Secondary | ICD-10-CM

## 2011-12-31 DIAGNOSIS — I4892 Unspecified atrial flutter: Secondary | ICD-10-CM

## 2011-12-31 LAB — POCT INR: INR: 2.5

## 2012-01-12 ENCOUNTER — Other Ambulatory Visit: Payer: Self-pay | Admitting: Cardiology

## 2012-01-28 ENCOUNTER — Ambulatory Visit (INDEPENDENT_AMBULATORY_CARE_PROVIDER_SITE_OTHER): Payer: Medicare Other | Admitting: Pharmacist

## 2012-01-28 DIAGNOSIS — Z7901 Long term (current) use of anticoagulants: Secondary | ICD-10-CM

## 2012-01-28 DIAGNOSIS — I4892 Unspecified atrial flutter: Secondary | ICD-10-CM

## 2012-01-28 LAB — POCT INR: INR: 2.1

## 2012-02-18 ENCOUNTER — Other Ambulatory Visit (INDEPENDENT_AMBULATORY_CARE_PROVIDER_SITE_OTHER): Payer: Medicare Other

## 2012-02-18 ENCOUNTER — Encounter: Payer: Self-pay | Admitting: Cardiology

## 2012-02-18 ENCOUNTER — Ambulatory Visit (INDEPENDENT_AMBULATORY_CARE_PROVIDER_SITE_OTHER): Payer: Medicare Other | Admitting: Cardiology

## 2012-02-18 VITALS — BP 100/80 | HR 66 | Ht 68.0 in | Wt 106.0 lb

## 2012-02-18 DIAGNOSIS — I493 Ventricular premature depolarization: Secondary | ICD-10-CM

## 2012-02-18 DIAGNOSIS — E7800 Pure hypercholesterolemia, unspecified: Secondary | ICD-10-CM

## 2012-02-18 DIAGNOSIS — I4892 Unspecified atrial flutter: Secondary | ICD-10-CM

## 2012-02-18 DIAGNOSIS — I4949 Other premature depolarization: Secondary | ICD-10-CM

## 2012-02-18 DIAGNOSIS — I119 Hypertensive heart disease without heart failure: Secondary | ICD-10-CM

## 2012-02-18 DIAGNOSIS — I4891 Unspecified atrial fibrillation: Secondary | ICD-10-CM

## 2012-02-18 DIAGNOSIS — I480 Paroxysmal atrial fibrillation: Secondary | ICD-10-CM

## 2012-02-18 LAB — BASIC METABOLIC PANEL
BUN: 20 mg/dL (ref 6–23)
CO2: 31 mEq/L (ref 19–32)
Calcium: 9.2 mg/dL (ref 8.4–10.5)
Chloride: 97 mEq/L (ref 96–112)
Creatinine, Ser: 0.7 mg/dL (ref 0.4–1.2)
GFR: 91.99 mL/min (ref >60.00)
Glucose, Bld: 92 mg/dL (ref 70–99)
Potassium: 4.1 mEq/L (ref 3.5–5.1)
Sodium: 136 mEq/L (ref 135–145)

## 2012-02-18 LAB — HEPATIC FUNCTION PANEL
ALT: 15 U/L (ref 0–35)
AST: 23 U/L (ref 0–37)
Albumin: 3.9 g/dL (ref 3.5–5.2)
Alkaline Phosphatase: 53 U/L (ref 39–117)
Bilirubin, Direct: 0 mg/dL (ref 0.0–0.3)
Total Bilirubin: 0.8 mg/dL (ref 0.3–1.2)
Total Protein: 7.2 g/dL (ref 6.0–8.3)

## 2012-02-18 LAB — LIPID PANEL
Cholesterol: 230 mg/dL — ABNORMAL HIGH (ref 0–200)
HDL: 87.9 mg/dL (ref >39.00)
Total CHOL/HDL Ratio: 3
Triglycerides: 50 mg/dL (ref 0.0–149.0)
VLDL: 10 mg/dL (ref 0.0–40.0)

## 2012-02-18 LAB — LDL CHOLESTEROL, DIRECT: Direct LDL: 126 mg/dL

## 2012-02-18 NOTE — Progress Notes (Signed)
[name] Weber Date of Birth:  04-02-1931 [facility] [address] [place], [place]  [address] [phone]  Fax   [phone]  HPI: This pleasant 78 year old woman is seen for a scheduled four-month followup office visit.  She is a past history of paroxysmal atrial flutter fibrillation.  She is on long-term Coumadin.  Since last visit she's not been aware of any palpitations.  She continues to exercise 4-5 times a week walking between 4 and 5 miles each time.  She is watching her diet and her weight is down 3 pounds.  Current Outpatient Prescriptions  Medication Sig Dispense Refill  . aspirin 81 MG tablet Take 81 mg by mouth daily.        . calcitonin, salmon, (MIACALCIN/FORTICAL) 200 UNIT/ACT nasal spray Place 1 spray into the nose daily.  3.7 mL  3  . Calcium Carbonate-Vitamin D (CALCIUM + D PO) Take by mouth 2 (two) times daily.        . Omega-3 Fatty Acids (FISH OIL PO) Take 2,000 mg by mouth 2 (two) times daily.        . ramipril (ALTACE) 5 MG tablet Take 5 mg by mouth daily.        Marland Kitchen warfarin (COUMADIN) 5 MG tablet Take 5 mg by mouth daily. AS DIRECTED         Allergies  Allergen Reactions  . Crestor (Rosuvastatin Calcium)     Patient Active Problem List  Diagnoses  . Paroxysmal atrial flutter  . Palpitations  . PAC (premature atrial contraction)  . PVC's (premature ventricular contractions)  . Malaise  . Fatigue  . Myalgia  . Atrial flutter  . Encounter for long-term (current) use of anticoagulants  . Benign hypertensive heart disease without heart failure  . Hypercholesterolemia  . Malaise and fatigue    History  Smoking status  . Former Smoker  . Quit date: 03/09/1991  Smokeless tobacco  . Not on file    History  Alcohol Use No    No family history on file.  Review of Systems: The patient denies any heat or cold intolerance.  No weight gain or weight loss.  The patient denies headaches or blurry vision.  There is no cough  or sputum production.  The patient denies dizziness.  There is no hematuria or hematochezia.  The patient denies any muscle aches or arthritis.  The patient denies any rash.  The patient denies frequent falling or instability.  There is no history of depression or anxiety.  All other systems were reviewed and are negative.   Physical Exam: Filed Vitals:   02/18/12 0835  BP: 100/80  Pulse: 66   General appearance reveals a thin elderly woman in no acute distress.Pupils equal and reactive.   Extraocular Movements are full.  There is no scleral icterus.  The mouth and pharynx are normal.  The neck is supple.  The carotids reveal no bruits.  The jugular venous pressure is normal.  The thyroid is not enlarged.  There is no lymphadenopathy.  The chest is clear to percussion and auscultation. There are no rales or rhonchi. Expansion of the chest is symmetrical.  The precordium is quiet.  The first heart sound is normal.  The second heart sound is physiologically split.  There is no murmur gallop rub or click.  There is no abnormal lift or heave.  The abdomen is soft and nontender. Bowel sounds are normal. The liver and spleen are not enlarged. There Are no  abdominal masses. There are no bruits.  Extremities no phlebitis or edema.  EKG today shows sinus bradycardia with occasional PVCs   Assessment / Plan: The patient will continue same medication and recheck in 4 months for followup office visit.  Blood work today pending.

## 2012-02-18 NOTE — Assessment & Plan Note (Signed)
The patient has not been experiencing any awareness of tachypalpitations

## 2012-02-18 NOTE — Assessment & Plan Note (Signed)
The patient has a past history of hypercholesterolemia.  We are checking fasting lab work today.  The patient is not on any statin therapy.  She is attempting to keep her cholesterol down with diet and exercise.

## 2012-02-18 NOTE — Patient Instructions (Signed)
Will obtain labs today and call you with the results (lp/bmet/hfp) Your physician recommends that you continue on your current medications as directed. Please refer to the Current Medication list given to you today. Your physician wants you to follow-up in: 4 months You will receive a reminder letter in the mail two months in advance. If you don't receive a letter, please call our office to schedule the follow-up appointment.   

## 2012-02-19 NOTE — Progress Notes (Signed)
Quick Note:  Please report to patient. The recent labs are stable. Continue same medication and careful diet. LDL 126. HDL remains excellent.Continue careful diet. (Statin intolerant) ______

## 2012-02-22 ENCOUNTER — Telehealth: Payer: Self-pay | Admitting: *Deleted

## 2012-02-22 DIAGNOSIS — I4891 Unspecified atrial fibrillation: Secondary | ICD-10-CM

## 2012-02-22 MED ORDER — WARFARIN SODIUM 5 MG PO TABS: 5.0000 mg | ORAL_TABLET | Freq: Every day | ORAL | Status: DC

## 2012-02-22 NOTE — Telephone Encounter (Signed)
Message copied by Burnell Blanks on Mon Feb 22, 2012 12:12 PM ------      Message from: Cassell Clement      Created: Fri Feb 19, 2012  5:40 AM       Please report to patient.  The recent labs are stable. Continue same medication and careful diet. LDL 126.  HDL remains excellent.Continue careful diet. (Statin intolerant)

## 2012-02-22 NOTE — Telephone Encounter (Signed)
Advised of labs 

## 2012-03-10 ENCOUNTER — Ambulatory Visit (INDEPENDENT_AMBULATORY_CARE_PROVIDER_SITE_OTHER): Payer: Medicare Other | Admitting: *Deleted

## 2012-03-10 DIAGNOSIS — I4892 Unspecified atrial flutter: Secondary | ICD-10-CM

## 2012-03-10 DIAGNOSIS — Z7901 Long term (current) use of anticoagulants: Secondary | ICD-10-CM

## 2012-03-10 LAB — POCT INR: INR: 2

## 2012-04-20 ENCOUNTER — Other Ambulatory Visit: Payer: Self-pay | Admitting: Cardiology

## 2012-04-21 ENCOUNTER — Ambulatory Visit (INDEPENDENT_AMBULATORY_CARE_PROVIDER_SITE_OTHER): Payer: Medicare Other | Admitting: *Deleted

## 2012-04-21 DIAGNOSIS — Z7901 Long term (current) use of anticoagulants: Secondary | ICD-10-CM

## 2012-04-21 DIAGNOSIS — I4892 Unspecified atrial flutter: Secondary | ICD-10-CM

## 2012-04-21 LAB — POCT INR: INR: 2.2

## 2012-06-07 ENCOUNTER — Ambulatory Visit (INDEPENDENT_AMBULATORY_CARE_PROVIDER_SITE_OTHER): Payer: Medicare Other | Admitting: *Deleted

## 2012-06-07 DIAGNOSIS — Z7901 Long term (current) use of anticoagulants: Secondary | ICD-10-CM

## 2012-06-07 DIAGNOSIS — I4892 Unspecified atrial flutter: Secondary | ICD-10-CM

## 2012-06-07 LAB — POCT INR: INR: 2.2

## 2012-07-11 ENCOUNTER — Encounter: Payer: Self-pay | Admitting: *Deleted

## 2012-07-11 ENCOUNTER — Other Ambulatory Visit: Payer: Self-pay | Admitting: Family Medicine

## 2012-07-11 DIAGNOSIS — Z780 Asymptomatic menopausal state: Secondary | ICD-10-CM

## 2012-07-11 DIAGNOSIS — Z1231 Encounter for screening mammogram for malignant neoplasm of breast: Secondary | ICD-10-CM

## 2012-07-12 ENCOUNTER — Encounter: Payer: Self-pay | Admitting: Cardiology

## 2012-07-12 ENCOUNTER — Ambulatory Visit (INDEPENDENT_AMBULATORY_CARE_PROVIDER_SITE_OTHER): Payer: Medicare Other | Admitting: Cardiology

## 2012-07-12 VITALS — BP 108/68 | HR 68 | Ht 68.0 in | Wt 108.8 lb

## 2012-07-12 DIAGNOSIS — R002 Palpitations: Secondary | ICD-10-CM

## 2012-07-12 DIAGNOSIS — E7800 Pure hypercholesterolemia, unspecified: Secondary | ICD-10-CM

## 2012-07-12 NOTE — Progress Notes (Signed)
[name] Weber Date of Birth:  February 06, 1931 [facility] [address] [place], [place]  [address] [phone]  Fax   [phone]  HPI: This pleasant 78 year old woman is seen for a four-month followup office visit.  She has a past history of paroxysmal atrial fibrillation.  She is on long-term Coumadin.  She also has a history of hypercholesterolemia.  Dr. Cyndia Bent is her primary care provider and will be checking her cholesterol next week. Current Outpatient Prescriptions  Medication Sig Dispense Refill  . aspirin 81 MG tablet Take 81 mg by mouth daily.        . calcitonin, salmon, (MIACALCIN/FORTICAL) 200 UNIT/ACT nasal spray Place 1 spray into the nose daily.  3.7 mL  3  . Calcium Carbonate-Vitamin D (CALCIUM + D PO) Take by mouth 2 (two) times daily.        . Omega-3 Fatty Acids (FISH OIL PO) Take 2,000 mg by mouth 2 (two) times daily.        . ramipril (ALTACE) 5 MG capsule TAKE 1 CAPSULE DAILY  90 capsule  3  . warfarin (COUMADIN) 5 MG tablet Take 1 tablet (5 mg total) by mouth daily. AS DIRECTED  90 tablet  3  . DISCONTD: ramipril (ALTACE) 5 MG tablet Take 5 mg by mouth daily.          Allergies  Allergen Reactions  . Crestor (Rosuvastatin Calcium)     Patient Active Problem List  Diagnosis  . Paroxysmal atrial flutter  . Palpitations  . PAC (premature atrial contraction)  . PVC's (premature ventricular contractions)  . Malaise  . Fatigue  . Myalgia  . Atrial flutter  . Encounter for long-term (current) use of anticoagulants  . Benign hypertensive heart disease without heart failure  . Hypercholesterolemia  . Malaise and fatigue    History  Smoking status  . Former Smoker  . Quit date: 03/09/1991  Smokeless tobacco  . Not on file    History  Alcohol Use No    History reviewed. No pertinent family history.  Review of Systems: The patient denies any heat or cold intolerance.  No weight gain or weight loss.  The patient denies  headaches or blurry vision.  There is no cough or sputum production.  The patient denies dizziness.  There is no hematuria or hematochezia.  The patient denies any muscle aches or arthritis.  The patient denies any rash.  The patient denies frequent falling or instability.  There is no history of depression or anxiety.  All other systems were reviewed and are negative.   Physical Exam: Filed Vitals:   07/12/12 1007  BP: 108/68  Pulse: 68   the general appearance reveals a well-developed well-nourished woman in no distress.  She has gained 2 pounds since last visit.The head and neck exam reveals pupils equal and reactive.  Extraocular movements are full.  There is no scleral icterus.  The mouth and pharynx are normal.  The neck is supple.  The carotids reveal no bruits.  The jugular venous pressure is normal.  The  thyroid is not enlarged.  There is no lymphadenopathy.  The chest is clear to percussion and auscultation.  There are no rales or rhonchi.  Expansion of the chest is symmetrical.  The precordium is quiet.  The first heart sound is normal.  The second heart sound is physiologically split.  There is no murmur gallop rub or click.  There is no abnormal lift or heave.  The abdomen  is soft and nontender.  The bowel sounds are normal.  The liver and spleen are not enlarged.  There are no abdominal masses.  There are no abdominal bruits.  Extremities reveal good pedal pulses.  There is no phlebitis or edema.  There is no cyanosis or clubbing.  Strength is normal and symmetrical in all extremities.  There is no lateralizing weakness.  There are no sensory deficits.  The skin is warm and dry.  There is no rash.      Assessment / Plan: Continue same medication.  Recheck in 4 months for followup office visit.  Continue long-term Coumadin.  We talked about no work alternatives to Coumadin such as Xarelto she would prefer to stay with Coumadin and have periodic blood tests to know where she  stands.

## 2012-07-12 NOTE — Assessment & Plan Note (Signed)
She is controlling her cholesterol with diet and exercise at this point

## 2012-07-12 NOTE — Patient Instructions (Addendum)
Your physician recommends that you continue on your current medications as directed. Please refer to the Current Medication list given to you today.  Your physician recommends that you schedule a follow-up appointment in: 4 months  

## 2012-07-12 NOTE — Assessment & Plan Note (Signed)
The patient has been aware of occasional isolated premature beats when she checks her radial pulse.  She has not had any sustained tachycardia or palpitations.  She still walks several miles a day for exercise.  She is not having any chest pain or shortness of breath

## 2012-07-19 ENCOUNTER — Ambulatory Visit (INDEPENDENT_AMBULATORY_CARE_PROVIDER_SITE_OTHER): Payer: Medicare Other | Admitting: *Deleted

## 2012-07-19 DIAGNOSIS — Z7901 Long term (current) use of anticoagulants: Secondary | ICD-10-CM

## 2012-07-19 DIAGNOSIS — I4892 Unspecified atrial flutter: Secondary | ICD-10-CM

## 2012-07-19 LAB — POCT INR: INR: 2

## 2012-07-20 ENCOUNTER — Ambulatory Visit
Admission: RE | Admit: 2012-07-20 | Discharge: 2012-07-20 | Disposition: A | Discharge status: Home / Self Care | Payer: Medicare Other | Source: Ambulatory Visit | Attending: Family Medicine | Admitting: Family Medicine

## 2012-07-20 DIAGNOSIS — Z1231 Encounter for screening mammogram for malignant neoplasm of breast: Secondary | ICD-10-CM

## 2012-07-20 DIAGNOSIS — Z780 Asymptomatic menopausal state: Secondary | ICD-10-CM

## 2012-08-30 ENCOUNTER — Ambulatory Visit (INDEPENDENT_AMBULATORY_CARE_PROVIDER_SITE_OTHER): Payer: Medicare Other | Admitting: *Deleted

## 2012-08-30 DIAGNOSIS — Z7901 Long term (current) use of anticoagulants: Secondary | ICD-10-CM

## 2012-08-30 DIAGNOSIS — I4892 Unspecified atrial flutter: Secondary | ICD-10-CM

## 2012-08-30 LAB — POCT INR: INR: 1.8

## 2012-09-27 ENCOUNTER — Ambulatory Visit (INDEPENDENT_AMBULATORY_CARE_PROVIDER_SITE_OTHER): Payer: Medicare Other

## 2012-09-27 DIAGNOSIS — I4892 Unspecified atrial flutter: Secondary | ICD-10-CM

## 2012-09-27 DIAGNOSIS — Z7901 Long term (current) use of anticoagulants: Secondary | ICD-10-CM

## 2012-09-27 LAB — POCT INR: INR: 3.1

## 2012-10-25 ENCOUNTER — Ambulatory Visit (INDEPENDENT_AMBULATORY_CARE_PROVIDER_SITE_OTHER): Payer: Medicare Other | Admitting: *Deleted

## 2012-10-25 DIAGNOSIS — I4892 Unspecified atrial flutter: Secondary | ICD-10-CM

## 2012-10-25 DIAGNOSIS — Z7901 Long term (current) use of anticoagulants: Secondary | ICD-10-CM

## 2012-10-25 LAB — POCT INR: INR: 2.7

## 2012-11-16 ENCOUNTER — Ambulatory Visit (INDEPENDENT_AMBULATORY_CARE_PROVIDER_SITE_OTHER): Payer: Medicare Other | Admitting: Cardiology

## 2012-11-16 ENCOUNTER — Encounter: Payer: Self-pay | Admitting: Cardiology

## 2012-11-16 ENCOUNTER — Ambulatory Visit (INDEPENDENT_AMBULATORY_CARE_PROVIDER_SITE_OTHER): Payer: Medicare Other | Admitting: *Deleted

## 2012-11-16 VITALS — BP 100/70 | HR 59 | Resp 18 | Ht 68.0 in | Wt 104.6 lb

## 2012-11-16 DIAGNOSIS — Z7901 Long term (current) use of anticoagulants: Secondary | ICD-10-CM

## 2012-11-16 DIAGNOSIS — E7800 Pure hypercholesterolemia, unspecified: Secondary | ICD-10-CM

## 2012-11-16 DIAGNOSIS — I4892 Unspecified atrial flutter: Secondary | ICD-10-CM

## 2012-11-16 DIAGNOSIS — I4891 Unspecified atrial fibrillation: Secondary | ICD-10-CM

## 2012-11-16 DIAGNOSIS — I480 Paroxysmal atrial fibrillation: Secondary | ICD-10-CM

## 2012-11-16 LAB — POCT INR: INR: 3.6

## 2012-11-16 NOTE — Progress Notes (Signed)
[name] Weber Date of Birth:  12/29/1930 [facility] [address] [place], [place]  [address] [phone]  Fax   [phone]  HPI: This pleasant 78 year old woman is seen for a four-month followup office visit. She has a past history of paroxysmal atrial fibrillation. She is on long-term Coumadin. She also has a history of hypercholesterolemia. Dr. Cyndia Bent is her primary care provider and follows her cholesterols.  Since last visit the patient has been experiencing increasing back pain.  She is suspicious that it may be secondary to her Lipitor therapy.   Current Outpatient Prescriptions  Medication Sig Dispense Refill  . aspirin 81 MG tablet Take 81 mg by mouth daily.        . calcitonin, salmon, (MIACALCIN/FORTICAL) 200 UNIT/ACT nasal spray Place 1 spray into the nose daily.  3.7 mL  3  . Calcium Carbonate-Vitamin D (CALCIUM + D PO) Take by mouth 2 (two) times daily.        . Omega-3 Fatty Acids (FISH OIL PO) Take 2,000 mg by mouth 2 (two) times daily.        . ramipril (ALTACE) 5 MG capsule TAKE 1 CAPSULE DAILY  90 capsule  3  . warfarin (COUMADIN) 5 MG tablet Take 1 tablet (5 mg total) by mouth daily. AS DIRECTED  90 tablet  3    Allergies  Allergen Reactions  . Crestor (Rosuvastatin Calcium)     Patient Active Problem List  Diagnosis  . Paroxysmal atrial flutter  . Palpitations  . PAC (premature atrial contraction)  . PVC's (premature ventricular contractions)  . Malaise  . Fatigue  . Myalgia  . Atrial flutter  . Encounter for long-term (current) use of anticoagulants  . Benign hypertensive heart disease without heart failure  . Hypercholesterolemia  . Malaise and fatigue    History  Smoking status  . Former Smoker  . Quit date: 03/09/1991  Smokeless tobacco  . Not on file    History  Alcohol Use No    No family history on file.  Review of Systems: The patient denies any heat or cold intolerance.  No weight gain or weight  loss.  The patient denies headaches or blurry vision.  There is no cough or sputum production.  The patient denies dizziness.  There is no hematuria or hematochezia.  The patient denies any muscle aches or arthritis.  The patient denies any rash.  The patient denies frequent falling or instability.  There is no history of depression or anxiety.  All other systems were reviewed and are negative.   Physical Exam: Filed Vitals:   11/16/12 0841  BP: 100/70  Pulse: 59  Resp: 18   the general appearance reveals a well-developed thin woman in no distress.The head and neck exam reveals pupils equal and reactive.  Extraocular movements are full.  There is no scleral icterus.  The mouth and pharynx are normal.  The neck is supple.  The carotids reveal no bruits.  The jugular venous pressure is normal.  The  thyroid is not enlarged.  There is no lymphadenopathy.  The chest is clear to percussion and auscultation.  There are no rales or rhonchi.  Expansion of the chest is symmetrical.  The precordium is quiet.  The first heart sound is normal.  The second heart sound is physiologically split.  There is no murmur gallop rub or click.  There is no abnormal lift or heave.  The abdomen is soft and nontender.  The bowel sounds  are normal.  The liver and spleen are not enlarged.  There are no abdominal masses.  There are no abdominal bruits.  Extremities reveal good pedal pulses.  There is no phlebitis or edema.  There is no cyanosis or clubbing.  Strength is normal and symmetrical in all extremities.  There is no lateralizing weakness.  There are no sensory deficits.  The skin is warm and dry.  There is no rash.      Assessment / Plan: Continue present medication except stop Lipitor temporarily and see her back pain resolves. Recheck here in 4 months for followup office visit and EKG

## 2012-11-16 NOTE — Patient Instructions (Addendum)
STOP LIPITOR  Your physician wants you to follow-up in: 4 month ov/ekg You will receive a reminder letter in the mail two months in advance. If you don't receive a letter, please call our office to schedule the follow-up appointment.

## 2012-11-16 NOTE — Assessment & Plan Note (Signed)
Because of her low back pain we will have her stop her Lipitor and see if the back pain resolves.  She is due to have repeat labs in January by her PCP

## 2012-11-16 NOTE — Assessment & Plan Note (Signed)
Patient has had no recurrence of her atrial flutter fibrillation.  Exercise tolerance remains good she walks 4 or 5 miles each day for exercise

## 2012-11-29 ENCOUNTER — Other Ambulatory Visit: Payer: Self-pay

## 2012-11-29 MED ORDER — CALCITONIN (SALMON) 200 UNIT/ACT NA SOLN: 1.0000 | Freq: Every day | NASAL | Status: DC

## 2012-12-02 ENCOUNTER — Other Ambulatory Visit: Payer: Self-pay

## 2012-12-07 ENCOUNTER — Ambulatory Visit (INDEPENDENT_AMBULATORY_CARE_PROVIDER_SITE_OTHER): Payer: Medicare Other | Admitting: Pharmacist

## 2012-12-07 ENCOUNTER — Ambulatory Visit (INDEPENDENT_AMBULATORY_CARE_PROVIDER_SITE_OTHER): Payer: Medicare Other | Admitting: Cardiology

## 2012-12-07 ENCOUNTER — Encounter: Payer: Self-pay | Admitting: Cardiology

## 2012-12-07 VITALS — BP 136/77 | HR 77 | Resp 18 | Ht 67.0 in | Wt 105.2 lb

## 2012-12-07 DIAGNOSIS — R42 Dizziness and giddiness: Secondary | ICD-10-CM

## 2012-12-07 DIAGNOSIS — I491 Atrial premature depolarization: Secondary | ICD-10-CM

## 2012-12-07 DIAGNOSIS — Z7901 Long term (current) use of anticoagulants: Secondary | ICD-10-CM

## 2012-12-07 DIAGNOSIS — I4892 Unspecified atrial flutter: Secondary | ICD-10-CM

## 2012-12-07 DIAGNOSIS — R002 Palpitations: Secondary | ICD-10-CM

## 2012-12-07 LAB — POCT INR: INR: 2.8

## 2012-12-07 MED ORDER — RAMIPRIL 2.5 MG PO CAPS: 2.5000 mg | ORAL_CAPSULE | Freq: Every day | ORAL | Status: DC

## 2012-12-07 MED ORDER — METOPROLOL SUCCINATE ER 25 MG PO TB24: 12.5000 mg | ORAL_TABLET | Freq: Every day | ORAL | Status: DC

## 2012-12-07 NOTE — Progress Notes (Signed)
Stephani Police Date of Birth:  07-28-1931 [facility] [address] [place], [place]  [address] [phone]         Fax   [phone]  History of Present Illness: This pleasant 79 year old woman is seen for a work in office visit. She has a past history of paroxysmal atrial fibrillation. She is on long-term Coumadin. She also has a history of hypercholesterolemia. Dr. Cyndia Bent is her primary care provider and follows her cholesterols.  She saw Dr. Fortunato Curling assistant yesterday complaining of heart palpitations and dizziness.  Her dizziness tends to occur about once a week.  It does not appear to be orthostatic.  Is not associated with any hearing loss or nausea or tinnitus.  She notes it particularly if she is out walking and stops to meet a friend and they stand still for a while then she starts to get dizzy.  When she resumes walking and dizziness resolves.  The patient has also been noted to have more palpitations when she checks her pulse.  Frequently she will notice trigeminy or quadrigeminal pattern.  She does not actually feel the palpitations were within her chest but only if she checks her radial pulse.  She has not been expressing any chest pain and she continues to walk several miles daily without any difficulty.   Current Outpatient Prescriptions  Medication Sig Dispense Refill  . aspirin 81 MG tablet Take 81 mg by mouth daily.        . calcitonin, salmon, (MIACALCIN/FORTICAL) 200 UNIT/ACT nasal spray Place 1 spray into the nose daily.  3.7 mL  3  . Calcium Carbonate-Vitamin D (CALCIUM + D PO) Take by mouth 2 (two) times daily.        . Omega-3 Fatty Acids (FISH OIL PO) Take 2,000 mg by mouth 2 (two) times daily.        . ramipril (ALTACE) 2.5 MG capsule Take 1 capsule (2.5 mg total) by mouth daily.  30 capsule  6  . warfarin (COUMADIN) 5 MG tablet Take 1 tablet (5 mg total) by mouth daily. AS DIRECTED  90 tablet  3  . metoprolol succinate (TOPROL XL) 25 MG 24  hr tablet Take 0.5 tablets (12.5 mg total) by mouth daily.  30 tablet  6    Allergies  Allergen Reactions  . Crestor (Rosuvastatin Calcium)     Patient Active Problem List  Diagnosis  . Paroxysmal atrial flutter  . Palpitations  . PAC (premature atrial contraction)  . PVC's (premature ventricular contractions)  . Malaise  . Fatigue  . Myalgia  . Atrial flutter  . Encounter for long-term (current) use of anticoagulants  . Benign hypertensive heart disease without heart failure  . Hypercholesterolemia  . Malaise and fatigue    History  Smoking status  . Former Smoker  . Quit date: 03/09/1991  Smokeless tobacco  . Not on file    History  Alcohol Use No    No family history on file.  Review of Systems: Constitutional: no fever chills diaphoresis or fatigue or change in weight.  Head and neck: no hearing loss, no epistaxis, no photophobia or visual disturbance. Respiratory: No cough, shortness of breath or wheezing. Cardiovascular: No chest pain peripheral edema, palpitations. Gastrointestinal: No abdominal distention, no abdominal pain, no change in bowel habits hematochezia or melena. Genitourinary: No dysuria, no frequency, no urgency, no nocturia. Musculoskeletal:No arthralgias, no back pain, no gait disturbance or myalgias. Neurological: No dizziness, no headaches, no numbness, no seizures, no  syncope, no weakness, no tremors. Hematologic: No lymphadenopathy, no easy bruising. Psychiatric: No confusion, no hallucinations, no sleep disturbance.    Physical Exam: Filed Vitals:   12/07/12 1101  BP: 136/77  Pulse: 77  Resp: 18   the general appearance reveals a tall thin woman in no distress.The head and neck exam reveals pupils equal and reactive.  Extraocular movements are full.  There is no scleral icterus.  The mouth and pharynx are normal.  The neck is supple.  The carotids reveal no bruits.  The jugular venous pressure is normal.  The  thyroid is not  enlarged.  There is no lymphadenopathy.  The chest is clear to percussion and auscultation.  There are no rales or rhonchi.  Expansion of the chest is symmetrical.  The precordium is quiet.  The pulse is regular with frequent premature beats The first heart sound is normal.  The second heart sound is physiologically split.  There is no murmur gallop rub or click.  There is no abnormal lift or heave.  The abdomen is soft and nontender.  The bowel sounds are normal.  The liver and spleen are not enlarged.  There are no abdominal masses.  There are no abdominal bruits.  Extremities reveal good pedal pulses.  There is no phlebitis or edema.  There is no cyanosis or clubbing.  Strength is normal and symmetrical in all extremities.  There is no lateralizing weakness.  There are no sensory deficits.  The skin is warm and dry.  There is no rash.   EKG shows normal sinus rhythm with PACs Assessment / Plan:  Continue same medication except decreased ramipril and and generic Toprol 25 mg one half tablet daily.  Recheck at regular visit or sooner when necessary.

## 2012-12-07 NOTE — Patient Instructions (Addendum)
Your physician has recommended you make the following change in your medication: DECREASE your Ramipril to 2.5 mg daily.  START Toprol XL 25 mg one-half tab (12.5 mg) daily  Your physician wants you to follow-up in: April.   You will receive a reminder letter in the mail two months in advance. If you don't receive a letter, please call our office to schedule the follow-up appointment.

## 2012-12-07 NOTE — Assessment & Plan Note (Signed)
She has a past history of atrial flutter.  EKG today confirms that she is in sinus rhythm with first-degree block and premature atrial complexes.

## 2012-12-07 NOTE — Assessment & Plan Note (Signed)
Has a history of an irregular pulse.  She is not presently on any beta blocker.  We will and generic Toprol 25 mg tablet one half tablet daily to try to diminish her premature beats.  At the same time because her blood pressure tends to be on the low normal side we will cut back her ramipril to just 2.5 mg daily

## 2012-12-13 ENCOUNTER — Telehealth: Payer: Self-pay | Admitting: *Deleted

## 2012-12-13 NOTE — Telephone Encounter (Signed)
Pt wants a refill on Fortical, told pt that Dr. Patty Sermons will have to autorize this medication. [employer].

## 2012-12-13 NOTE — Telephone Encounter (Signed)
Advised patient further refills need to come from PCP Dr Cyndia Bent per  Dr. Patty Sermons

## 2013-01-04 ENCOUNTER — Ambulatory Visit (INDEPENDENT_AMBULATORY_CARE_PROVIDER_SITE_OTHER): Payer: Medicare Other | Admitting: *Deleted

## 2013-01-04 DIAGNOSIS — I4892 Unspecified atrial flutter: Secondary | ICD-10-CM

## 2013-01-04 DIAGNOSIS — Z7901 Long term (current) use of anticoagulants: Secondary | ICD-10-CM

## 2013-01-04 LAB — POCT INR: INR: 3

## 2013-02-01 ENCOUNTER — Ambulatory Visit (INDEPENDENT_AMBULATORY_CARE_PROVIDER_SITE_OTHER): Payer: Medicare Other | Admitting: *Deleted

## 2013-02-01 DIAGNOSIS — Z7901 Long term (current) use of anticoagulants: Secondary | ICD-10-CM

## 2013-02-01 DIAGNOSIS — I4892 Unspecified atrial flutter: Secondary | ICD-10-CM

## 2013-02-01 LAB — POCT INR: INR: 2.7

## 2013-03-20 ENCOUNTER — Encounter: Payer: Self-pay | Admitting: Cardiology

## 2013-03-20 ENCOUNTER — Ambulatory Visit (INDEPENDENT_AMBULATORY_CARE_PROVIDER_SITE_OTHER): Payer: Medicare Other | Admitting: Cardiology

## 2013-03-20 ENCOUNTER — Ambulatory Visit (INDEPENDENT_AMBULATORY_CARE_PROVIDER_SITE_OTHER): Payer: Medicare Other | Admitting: *Deleted

## 2013-03-20 VITALS — BP 130/64 | HR 63 | Ht 65.0 in | Wt 111.4 lb

## 2013-03-20 DIAGNOSIS — I4892 Unspecified atrial flutter: Secondary | ICD-10-CM

## 2013-03-20 DIAGNOSIS — I119 Hypertensive heart disease without heart failure: Secondary | ICD-10-CM

## 2013-03-20 DIAGNOSIS — Z7901 Long term (current) use of anticoagulants: Secondary | ICD-10-CM

## 2013-03-20 DIAGNOSIS — I4891 Unspecified atrial fibrillation: Secondary | ICD-10-CM

## 2013-03-20 DIAGNOSIS — I480 Paroxysmal atrial fibrillation: Secondary | ICD-10-CM

## 2013-03-20 DIAGNOSIS — E7800 Pure hypercholesterolemia, unspecified: Secondary | ICD-10-CM

## 2013-03-20 LAB — POCT INR: INR: 3.5

## 2013-03-20 MED ORDER — WARFARIN SODIUM 5 MG PO TABS: ORAL_TABLET | ORAL | Status: DC

## 2013-03-20 NOTE — Assessment & Plan Note (Signed)
The patient has had no recurrence of her atrial flutter or fibrillation.  No TIA symptoms.  She remains on long-term Coumadin and she has not been having any problems from the anticoagulation

## 2013-03-20 NOTE — Assessment & Plan Note (Signed)
Her blood pressure was remaining stable on lower dose of ACE inhibitor ramipril 2.5 mg daily

## 2013-03-20 NOTE — Patient Instructions (Signed)
Your physician recommends that you continue on your current medications as directed. Please refer to the Current Medication list given to you today.  Your physician recommends that you schedule a follow-up appointment in: 4 month ov  

## 2013-03-20 NOTE — Assessment & Plan Note (Signed)
The patient is on low-dose Lipitor.  Her lipids are monitored by her primary care physician.  She is not having any myalgias from Lipitor

## 2013-03-20 NOTE — Progress Notes (Signed)
[name] Weber Date of Birth:  01/08/1931 [facility] [address] [place], [place]  [address] [phone]  Fax   [phone]  HPI: This pleasant 79 year old woman is seen for a four-month followup office visit. She has a past history of paroxysmal atrial fibrillation. She is on long-term Coumadin. She also has a history of hypercholesterolemia. Dr. Cyndia Bent is her primary care provider and follows her cholesterols.  At her last visit she was complaining some of occasional dizziness.  This has resolved with reduction of the dose of her beta blocker and her ACE inhibitor.  Current Outpatient Prescriptions  Medication Sig Dispense Refill  . alendronate (FOSAMAX) 35 MG tablet Take 35 mg by mouth every 7 (seven) days. Take with a full glass of water on an empty stomach.      Marland Kitchen aspirin 81 MG tablet Take 81 mg by mouth daily.        Marland Kitchen atorvastatin (LIPITOR) 10 MG tablet Take 10 mg by mouth daily.      . Calcium Carbonate-Vitamin D (CALCIUM + D PO) Take by mouth 2 (two) times daily.        . metoprolol succinate (TOPROL XL) 25 MG 24 hr tablet Take 0.5 tablets (12.5 mg total) by mouth daily.  30 tablet  6  . Omega-3 Fatty Acids (FISH OIL PO) Take 2,000 mg by mouth 2 (two) times daily.        . ramipril (ALTACE) 2.5 MG capsule Take 1 capsule (2.5 mg total) by mouth daily.  30 capsule  6  . warfarin (COUMADIN) 5 MG tablet Take 1 tablet (5 mg total) by mouth daily. AS DIRECTED  90 tablet  3   No current facility-administered medications for this visit.    Allergies  Allergen Reactions  . Crestor (Rosuvastatin Calcium)     Patient Active Problem List  Diagnosis  . Paroxysmal atrial flutter  . Palpitations  . PAC (premature atrial contraction)  . PVC's (premature ventricular contractions)  . Malaise  . Fatigue  . Myalgia  . Atrial flutter  . Encounter for long-term (current) use of anticoagulants  . Benign hypertensive heart disease without heart failure  .  Hypercholesterolemia  . Malaise and fatigue    History  Smoking status  . Former Smoker  . Quit date: 03/09/1991  Smokeless tobacco  . Not on file    History  Alcohol Use No    No family history on file.  Review of Systems: The patient denies any heat or cold intolerance.  No weight gain or weight loss.  The patient denies headaches or blurry vision.  There is no cough or sputum production.  The patient denies dizziness.  There is no hematuria or hematochezia.  The patient denies any muscle aches or arthritis.  The patient denies any rash.  The patient denies frequent falling or instability.  There is no history of depression or anxiety.  All other systems were reviewed and are negative.   Physical Exam: Filed Vitals:   03/20/13 0856  BP: 130/64  Pulse: 63   general appearance reveals a well-developed well-nourished woman in no distress.The head and neck exam reveals pupils equal and reactive.  Extraocular movements are full.  There is no scleral icterus.  The mouth and pharynx are normal.  The neck is supple.  The carotids reveal no bruits.  The jugular venous pressure is normal.  The  thyroid is not enlarged.  There is no lymphadenopathy.  The chest is clear to  percussion and auscultation.  There are no rales or rhonchi.  Expansion of the chest is symmetrical.  The precordium is quiet.  The first heart sound is normal.  The second heart sound is physiologically split.  There is no murmur gallop rub or click.  There is no abnormal lift or heave.  The abdomen is soft and nontender.  The bowel sounds are normal.  The liver and spleen are not enlarged.  There are no abdominal masses.  There are no abdominal bruits.  Extremities reveal good pedal pulses.  There is no phlebitis or edema.  There is no cyanosis or clubbing.  Strength is normal and symmetrical in all extremities.  There is no lateralizing weakness.  There are no sensory deficits.  The skin is warm and dry.  There is no  rash.  EKG shows normal sinus rhythm with left anterior hemiblock.  Since 02/18/12, PVCs are absent.    Assessment / Plan: Continue same medication.  She walks about 25 miles a week.  Continue excellent exercise program.  Recheck in 4 months.

## 2013-04-10 ENCOUNTER — Ambulatory Visit (INDEPENDENT_AMBULATORY_CARE_PROVIDER_SITE_OTHER): Payer: Medicare Other

## 2013-04-10 DIAGNOSIS — I4892 Unspecified atrial flutter: Secondary | ICD-10-CM

## 2013-04-10 DIAGNOSIS — Z7901 Long term (current) use of anticoagulants: Secondary | ICD-10-CM

## 2013-04-10 LAB — POCT INR: INR: 3.5

## 2013-04-25 ENCOUNTER — Ambulatory Visit (INDEPENDENT_AMBULATORY_CARE_PROVIDER_SITE_OTHER): Payer: Medicare Other | Admitting: *Deleted

## 2013-04-25 DIAGNOSIS — Z7901 Long term (current) use of anticoagulants: Secondary | ICD-10-CM

## 2013-04-25 DIAGNOSIS — I4892 Unspecified atrial flutter: Secondary | ICD-10-CM

## 2013-04-25 LAB — POCT INR: INR: 2.3

## 2013-05-16 ENCOUNTER — Ambulatory Visit (INDEPENDENT_AMBULATORY_CARE_PROVIDER_SITE_OTHER): Payer: Medicare Other | Admitting: *Deleted

## 2013-05-16 DIAGNOSIS — Z7901 Long term (current) use of anticoagulants: Secondary | ICD-10-CM

## 2013-05-16 DIAGNOSIS — I4892 Unspecified atrial flutter: Secondary | ICD-10-CM

## 2013-05-16 LAB — POCT INR: INR: 1.9

## 2013-06-06 ENCOUNTER — Ambulatory Visit (INDEPENDENT_AMBULATORY_CARE_PROVIDER_SITE_OTHER): Payer: Medicare Other | Admitting: *Deleted

## 2013-06-06 DIAGNOSIS — Z7901 Long term (current) use of anticoagulants: Secondary | ICD-10-CM

## 2013-06-06 DIAGNOSIS — I4892 Unspecified atrial flutter: Secondary | ICD-10-CM

## 2013-06-06 LAB — POCT INR: INR: 2.5

## 2013-07-03 ENCOUNTER — Other Ambulatory Visit: Payer: Self-pay | Admitting: *Deleted

## 2013-07-03 MED ORDER — RAMIPRIL 2.5 MG PO CAPS: 2.5000 mg | ORAL_CAPSULE | Freq: Every day | ORAL | Status: DC

## 2013-07-04 ENCOUNTER — Ambulatory Visit (INDEPENDENT_AMBULATORY_CARE_PROVIDER_SITE_OTHER): Payer: Medicare Other | Admitting: *Deleted

## 2013-07-04 DIAGNOSIS — Z7901 Long term (current) use of anticoagulants: Secondary | ICD-10-CM

## 2013-07-04 DIAGNOSIS — I4892 Unspecified atrial flutter: Secondary | ICD-10-CM

## 2013-07-04 LAB — POCT INR: INR: 2.3

## 2013-07-21 ENCOUNTER — Ambulatory Visit (INDEPENDENT_AMBULATORY_CARE_PROVIDER_SITE_OTHER): Payer: Medicare Other | Admitting: Cardiology

## 2013-07-21 ENCOUNTER — Encounter: Payer: Self-pay | Admitting: Cardiology

## 2013-07-21 VITALS — BP 122/80 | HR 68 | Ht 65.0 in | Wt 108.4 lb

## 2013-07-21 DIAGNOSIS — I119 Hypertensive heart disease without heart failure: Secondary | ICD-10-CM

## 2013-07-21 DIAGNOSIS — I4892 Unspecified atrial flutter: Secondary | ICD-10-CM

## 2013-07-21 DIAGNOSIS — E7800 Pure hypercholesterolemia, unspecified: Secondary | ICD-10-CM

## 2013-07-21 NOTE — Assessment & Plan Note (Signed)
She is not having any side effects from low dose statin therapy

## 2013-07-21 NOTE — Patient Instructions (Addendum)
Your physician recommends that you continue on your current medications as directed. Please refer to the Current Medication list given to you today.  Your physician wants you to follow-up in: 4 months with Dr. Patty Sermons. You will receive a reminder letter in the mail two months in advance. If you don't receive a letter, please call our office to schedule the follow-up appointment.

## 2013-07-21 NOTE — Assessment & Plan Note (Signed)
No awareness of any palpitations or fluttering of her heart.  No dizziness.  No syncope.  Exercise tolerance is excellent.

## 2013-07-21 NOTE — Progress Notes (Signed)
Stephani Police Date of Birth:  08/30/1931 [facility] [address] [place], [place]  [address] [phone]  Fax   [phone]  HPI: This pleasant 79 year old woman is seen for a four-month followup office visit. She has a past history of paroxysmal atrial fibrillation. She is on long-term Coumadin. She also has a history of hypercholesterolemia. Dr. Cyndia Bent is her primary care provider and follows her cholesterols. At her last visit she was complaining some of occasional dizziness. This has resolved with reduction of the dose of her beta blocker and her ACE inhibitor.  She continues to walk every day.  Last week she walked a total of 27 miles.  She has had no recurrence of atrial fibrillation.   Current Outpatient Prescriptions  Medication Sig Dispense Refill  . alendronate (FOSAMAX) 35 MG tablet Take 35 mg by mouth every 7 (seven) days. Take with a full glass of water on an empty stomach.      Marland Kitchen amoxicillin (AMOXIL) 500 MG capsule as needed.       Marland Kitchen aspirin 81 MG tablet Take 81 mg by mouth daily.        Marland Kitchen atorvastatin (LIPITOR) 10 MG tablet Take 10 mg by mouth daily.      . Calcium Carbonate-Vitamin D (CALCIUM + D PO) Take by mouth 2 (two) times daily.        . metoprolol succinate (TOPROL XL) 25 MG 24 hr tablet Take 0.5 tablets (12.5 mg total) by mouth daily.  30 tablet  6  . Omega-3 Fatty Acids (FISH OIL PO) Take 2,000 mg by mouth 2 (two) times daily.        . ramipril (ALTACE) 2.5 MG capsule Take 1 capsule (2.5 mg total) by mouth daily.  30 capsule  6  . warfarin (COUMADIN) 5 MG tablet Take as directed by coumadin clinic  90 tablet  1   No current facility-administered medications for this visit.    Allergies  Allergen Reactions  . Crestor [Rosuvastatin Calcium]     Patient Active Problem List   Diagnosis Date Noted  . Atrial flutter 03/10/2011    Priority: Medium  . Hypercholesterolemia 03/10/2011    Priority: Medium  . Palpitations     Priority:  Medium  . Malaise and fatigue 06/01/2011  . Encounter for long-term (current) use of anticoagulants 03/10/2011  . Benign hypertensive heart disease without heart failure 03/10/2011  . Paroxysmal atrial flutter   . PAC (premature atrial contraction)   . PVC's (premature ventricular contractions)   . Malaise   . Fatigue   . Myalgia     History  Smoking status  . Former Smoker  . Quit date: 03/09/1991  Smokeless tobacco  . Not on file    History  Alcohol Use No    No family history on file.  Review of Systems: The patient denies any heat or cold intolerance.  No weight gain or weight loss.  The patient denies headaches or blurry vision.  There is no cough or sputum production.  The patient denies dizziness.  There is no hematuria or hematochezia.  The patient denies any muscle aches or arthritis.  The patient denies any rash.  The patient denies frequent falling or instability.  There is no history of depression or anxiety.  All other systems were reviewed and are negative.   Physical Exam: Filed Vitals:   07/21/13 0857  BP: 122/80  Pulse: 68   the general appearance reveals a thin woman who has  lost 3 pounds since last visit.The head and neck exam reveals pupils equal and reactive.  Extraocular movements are full.  There is no scleral icterus.  The mouth and pharynx are normal.  The neck is supple.  The carotids reveal no bruits.  The jugular venous pressure is normal.  The  thyroid is not enlarged.  There is no lymphadenopathy.  The chest is clear to percussion and auscultation.  There are no rales or rhonchi.  Expansion of the chest is symmetrical.  The precordium is quiet.  The first heart sound is normal.  The second heart sound is physiologically split.  There is no murmur gallop rub or click.  There is no abnormal lift or heave.  The abdomen is soft and nontender.  The bowel sounds are normal.  The liver and spleen are not enlarged.  There are no abdominal masses.  There are no  abdominal bruits.  Extremities reveal good pedal pulses.  There is no phlebitis or edema.  There is no cyanosis or clubbing.  Strength is normal and symmetrical in all extremities.  There is no lateralizing weakness.  There are no sensory deficits.  The skin is warm and dry.  There is no rash.      Assessment / Plan: Patient continues to do very well.  Continue same medication.  Recheck in 4 months for office visit and EKG

## 2013-08-01 ENCOUNTER — Ambulatory Visit (INDEPENDENT_AMBULATORY_CARE_PROVIDER_SITE_OTHER): Payer: Medicare Other

## 2013-08-01 DIAGNOSIS — I4892 Unspecified atrial flutter: Secondary | ICD-10-CM

## 2013-08-01 DIAGNOSIS — Z7901 Long term (current) use of anticoagulants: Secondary | ICD-10-CM

## 2013-08-01 LAB — POCT INR: INR: 2.4

## 2013-09-06 ENCOUNTER — Other Ambulatory Visit: Payer: Self-pay

## 2013-09-06 DIAGNOSIS — Z1231 Encounter for screening mammogram for malignant neoplasm of breast: Secondary | ICD-10-CM

## 2013-09-11 ENCOUNTER — Ambulatory Visit (INDEPENDENT_AMBULATORY_CARE_PROVIDER_SITE_OTHER): Payer: Medicare Other | Admitting: *Deleted

## 2013-09-11 DIAGNOSIS — Z7901 Long term (current) use of anticoagulants: Secondary | ICD-10-CM

## 2013-09-11 DIAGNOSIS — I4892 Unspecified atrial flutter: Secondary | ICD-10-CM

## 2013-09-11 LAB — POCT INR: INR: 2.3

## 2013-09-29 ENCOUNTER — Ambulatory Visit
Admission: RE | Admit: 2013-09-29 | Discharge: 2013-09-29 | Disposition: A | Discharge status: Home / Self Care | Payer: Medicare Other | Source: Ambulatory Visit

## 2013-09-29 DIAGNOSIS — Z1231 Encounter for screening mammogram for malignant neoplasm of breast: Secondary | ICD-10-CM

## 2013-10-23 ENCOUNTER — Ambulatory Visit (INDEPENDENT_AMBULATORY_CARE_PROVIDER_SITE_OTHER): Payer: Medicare Other | Admitting: *Deleted

## 2013-10-23 DIAGNOSIS — Z7901 Long term (current) use of anticoagulants: Secondary | ICD-10-CM

## 2013-10-23 DIAGNOSIS — I4892 Unspecified atrial flutter: Secondary | ICD-10-CM

## 2013-10-23 LAB — POCT INR: INR: 3.5

## 2013-11-13 ENCOUNTER — Encounter: Payer: Self-pay | Admitting: Cardiology

## 2013-11-13 ENCOUNTER — Ambulatory Visit (INDEPENDENT_AMBULATORY_CARE_PROVIDER_SITE_OTHER): Payer: Medicare Other | Admitting: Pharmacist

## 2013-11-13 ENCOUNTER — Ambulatory Visit (INDEPENDENT_AMBULATORY_CARE_PROVIDER_SITE_OTHER): Payer: Medicare Other | Admitting: Cardiology

## 2013-11-13 VITALS — BP 149/70 | HR 64 | Ht 67.0 in | Wt 107.0 lb

## 2013-11-13 DIAGNOSIS — Z7901 Long term (current) use of anticoagulants: Secondary | ICD-10-CM

## 2013-11-13 DIAGNOSIS — I480 Paroxysmal atrial fibrillation: Secondary | ICD-10-CM

## 2013-11-13 DIAGNOSIS — I4891 Unspecified atrial fibrillation: Secondary | ICD-10-CM

## 2013-11-13 DIAGNOSIS — E7800 Pure hypercholesterolemia, unspecified: Secondary | ICD-10-CM

## 2013-11-13 DIAGNOSIS — I4892 Unspecified atrial flutter: Secondary | ICD-10-CM

## 2013-11-13 DIAGNOSIS — I119 Hypertensive heart disease without heart failure: Secondary | ICD-10-CM

## 2013-11-13 LAB — POCT INR: INR: 3

## 2013-11-13 NOTE — Assessment & Plan Note (Signed)
Pressure was remaining stable on current therapy.

## 2013-11-13 NOTE — Progress Notes (Signed)
[name] Weber Date of Birth:  [birth year] [address] [place], [place]  [address] [phone]  Fax   [phone]  HPI: This pleasant 79 year old woman is seen for a four-month followup office visit. She has a past history of paroxysmal atrial fibrillation. She is on long-term Coumadin. She also has a history of hypercholesterolemia. Dr. Cyndia Bent is her primary care provider and follows her cholesterols.  Since last visit she has been feeling well.  She still walks about 4-6 miles most days of the week.  She walks at a brisk pace of a 15 minute mile.  She has not been aware of any recent palpitations.  Current Outpatient Prescriptions  Medication Sig Dispense Refill  . alendronate (FOSAMAX) 35 MG tablet Take 35 mg by mouth every 7 (seven) days. Take with a full glass of water on an empty stomach.      Marland Kitchen amoxicillin (AMOXIL) 500 MG capsule as needed.       Marland Kitchen aspirin 81 MG tablet Take 81 mg by mouth daily.        Marland Kitchen atorvastatin (LIPITOR) 10 MG tablet Take 10 mg by mouth daily.      . Calcium Carbonate-Vitamin D (CALCIUM + D PO) Take by mouth 2 (two) times daily.        . metoprolol succinate (TOPROL XL) 25 MG 24 hr tablet Take 0.5 tablets (12.5 mg total) by mouth daily.  30 tablet  6  . Omega-3 Fatty Acids (FISH OIL PO) Take 2,000 mg by mouth 2 (two) times daily.        . ramipril (ALTACE) 2.5 MG capsule Take 1 capsule (2.5 mg total) by mouth daily.  30 capsule  6  . warfarin (COUMADIN) 5 MG tablet Take as directed by coumadin clinic  90 tablet  1   No current facility-administered medications for this visit.    Allergies  Allergen Reactions  . Crestor [Rosuvastatin Calcium]     Patient Active Problem List   Diagnosis Date Noted  . Atrial flutter 03/10/2011    Priority: Medium  . Hypercholesterolemia 03/10/2011    Priority: Medium  . Palpitations     Priority: Medium  . Malaise and fatigue 06/01/2011  . Encounter for long-term (current) use of anticoagulants  03/10/2011  . Benign hypertensive heart disease without heart failure 03/10/2011  . Paroxysmal atrial flutter   . PAC (premature atrial contraction)   . PVC's (premature ventricular contractions)   . Malaise   . Fatigue   . Myalgia     History  Smoking status  . Former Smoker  . Quit date: 03/09/1991  Smokeless tobacco  . Not on file    History  Alcohol Use No    History reviewed. No pertinent family history.  Review of Systems: The patient denies any heat or cold intolerance.  No weight gain or weight loss.  The patient denies headaches or blurry vision.  There is no cough or sputum production.  The patient denies dizziness.  There is no hematuria or hematochezia.  The patient denies any muscle aches or arthritis.  The patient denies any rash.  The patient denies frequent falling or instability.  There is no history of depression or anxiety.  All other systems were reviewed and are negative.   Physical Exam: Filed Vitals:   11/13/13 0922  BP: 149/70  Pulse: 64   the general appearance reveals a thin woman who has lost 3 pounds since last visit.The head and neck exam  reveals pupils equal and reactive.  Extraocular movements are full.  There is no scleral icterus.  The mouth and pharynx are normal.  The neck is supple.  The carotids reveal no bruits.  The jugular venous pressure is normal.  The  thyroid is not enlarged.  There is no lymphadenopathy.  The chest is clear to percussion and auscultation.  There are no rales or rhonchi.  Expansion of the chest is symmetrical.  The precordium is quiet.  The first heart sound is normal.  The second heart sound is physiologically split.  There is no murmur gallop rub or click.  There is no abnormal lift or heave.  The abdomen is soft and nontender.  The bowel sounds are normal.  The liver and spleen are not enlarged.  There are no abdominal masses.  There are no abdominal bruits.  Extremities reveal good pedal pulses.  There is no phlebitis  or edema.  There is no cyanosis or clubbing.  Strength is normal and symmetrical in all extremities.  There is no lateralizing weakness.  There are no sensory deficits.  The skin is warm and dry.  There is no rash.  EKG today shows atrial flutter with controlled variable ventricular response.  Occasional PVCs are noted.  No ischemic changes.   Assessment / Plan: Patient continues to do well.  She is in paroxysmal atrial flutter today. Continue same medication.  Recheck in 4 months for office visit and EKG.

## 2013-11-13 NOTE — Assessment & Plan Note (Signed)
Patient has hypercholesterolemia.  She is on low-dose atorvastatin.  Her lipids are followed by her PCP

## 2013-11-13 NOTE — Assessment & Plan Note (Signed)
EKG today shows that she is back in atrial flutter with a controlled ventricular response.  She is not having any associated symptoms.  No chest pain dizziness or syncope.  No shortness of breath.

## 2013-11-13 NOTE — Patient Instructions (Signed)
Coumadin Clinic today    Continue same medication     Your physician recommends that you schedule a follow-up appointment in: 4 months with a ekg

## 2013-11-19 ENCOUNTER — Other Ambulatory Visit: Payer: Self-pay | Admitting: Cardiology

## 2013-12-04 ENCOUNTER — Other Ambulatory Visit: Payer: Self-pay

## 2013-12-04 MED ORDER — METOPROLOL SUCCINATE ER 25 MG PO TB24: 12.5000 mg | ORAL_TABLET | Freq: Every day | ORAL | Status: DC

## 2013-12-04 MED ORDER — RAMIPRIL 2.5 MG PO CAPS: 2.5000 mg | ORAL_CAPSULE | Freq: Every day | ORAL | Status: DC

## 2013-12-11 ENCOUNTER — Ambulatory Visit (INDEPENDENT_AMBULATORY_CARE_PROVIDER_SITE_OTHER): Payer: Medicare Other | Admitting: *Deleted

## 2013-12-11 DIAGNOSIS — Z7901 Long term (current) use of anticoagulants: Secondary | ICD-10-CM

## 2013-12-11 DIAGNOSIS — I4892 Unspecified atrial flutter: Secondary | ICD-10-CM

## 2013-12-11 LAB — POCT INR: INR: 3.7

## 2013-12-25 ENCOUNTER — Ambulatory Visit (INDEPENDENT_AMBULATORY_CARE_PROVIDER_SITE_OTHER): Payer: Medicare Other

## 2013-12-25 DIAGNOSIS — Z5181 Encounter for therapeutic drug level monitoring: Secondary | ICD-10-CM

## 2013-12-25 DIAGNOSIS — Z7901 Long term (current) use of anticoagulants: Secondary | ICD-10-CM

## 2013-12-25 DIAGNOSIS — I4892 Unspecified atrial flutter: Secondary | ICD-10-CM

## 2013-12-25 HISTORY — DX: Encounter for therapeutic drug level monitoring: Z51.81

## 2013-12-25 LAB — POCT INR: INR: 2.2

## 2014-01-15 ENCOUNTER — Ambulatory Visit (INDEPENDENT_AMBULATORY_CARE_PROVIDER_SITE_OTHER): Payer: Medicare Other | Admitting: *Deleted

## 2014-01-15 DIAGNOSIS — Z5181 Encounter for therapeutic drug level monitoring: Secondary | ICD-10-CM

## 2014-01-15 DIAGNOSIS — Z7901 Long term (current) use of anticoagulants: Secondary | ICD-10-CM

## 2014-01-15 DIAGNOSIS — I4892 Unspecified atrial flutter: Secondary | ICD-10-CM

## 2014-01-15 DIAGNOSIS — I4891 Unspecified atrial fibrillation: Secondary | ICD-10-CM

## 2014-01-15 LAB — POCT INR: INR: 2.6

## 2014-01-15 MED ORDER — WARFARIN SODIUM 5 MG PO TABS: ORAL_TABLET | ORAL | Status: DC

## 2014-02-12 ENCOUNTER — Ambulatory Visit (INDEPENDENT_AMBULATORY_CARE_PROVIDER_SITE_OTHER): Payer: Medicare Other | Admitting: *Deleted

## 2014-02-12 DIAGNOSIS — Z7901 Long term (current) use of anticoagulants: Secondary | ICD-10-CM

## 2014-02-12 DIAGNOSIS — Z5181 Encounter for therapeutic drug level monitoring: Secondary | ICD-10-CM

## 2014-02-12 DIAGNOSIS — I4892 Unspecified atrial flutter: Secondary | ICD-10-CM

## 2014-02-12 LAB — POCT INR: INR: 2.1

## 2014-02-19 ENCOUNTER — Encounter: Payer: Self-pay | Admitting: Cardiology

## 2014-03-13 ENCOUNTER — Ambulatory Visit (INDEPENDENT_AMBULATORY_CARE_PROVIDER_SITE_OTHER): Payer: Medicare Other | Admitting: Cardiology

## 2014-03-13 ENCOUNTER — Ambulatory Visit (INDEPENDENT_AMBULATORY_CARE_PROVIDER_SITE_OTHER): Payer: Medicare Other

## 2014-03-13 VITALS — BP 118/78 | HR 49 | Ht 67.0 in | Wt 109.0 lb

## 2014-03-13 DIAGNOSIS — Z7901 Long term (current) use of anticoagulants: Secondary | ICD-10-CM

## 2014-03-13 DIAGNOSIS — I119 Hypertensive heart disease without heart failure: Secondary | ICD-10-CM

## 2014-03-13 DIAGNOSIS — R5383 Other fatigue: Secondary | ICD-10-CM

## 2014-03-13 DIAGNOSIS — I4892 Unspecified atrial flutter: Secondary | ICD-10-CM

## 2014-03-13 DIAGNOSIS — R5381 Other malaise: Secondary | ICD-10-CM

## 2014-03-13 DIAGNOSIS — Z5181 Encounter for therapeutic drug level monitoring: Secondary | ICD-10-CM

## 2014-03-13 DIAGNOSIS — I4891 Unspecified atrial fibrillation: Secondary | ICD-10-CM

## 2014-03-13 LAB — POCT INR: INR: 2.8

## 2014-03-13 NOTE — Patient Instructions (Signed)
DECREASE YOUR ASPIRIN TO Monday, Wednesday, AND Friday ONLY  START COENZYME Q10 ONE TABLET DAILY  Your physician wants you to follow-up in: 4 MONTH OV/EKG You will receive a reminder letter in the mail two months in advance. If you don't receive a letter, please call our office to schedule the follow-up appointment.

## 2014-03-13 NOTE — Assessment & Plan Note (Signed)
The patient has not been having any dizziness or syncope.  No symptoms of CHF.

## 2014-03-13 NOTE — Assessment & Plan Note (Signed)
Energy level has been stable.  Her appetite is good and her weight is up 2 pounds since last visit.

## 2014-03-13 NOTE — Progress Notes (Signed)
[name] Weber Date of Birth:  01/13/1931 [address] [place], [place]  [id number] [phone]  Fax   [phone]  HPI: This pleasant 80 year old woman is seen for a four-month followup office visit. She has a past history of paroxysmal atrial fibrillation. She is on long-term Coumadin. She also has a history of hypercholesterolemia. Dr. Cyndia BentBadger is her primary care provider and follows her cholesterols.  Since last visit she has been feeling well.  She still walks about 4-6 miles most days of the week.  She walks at a brisk pace of a 15 minute mile.  She has not been aware of any recent palpitations.  At her last visit she was found to be in atrial flutter with controlled ventricular response which was asymptomatic.  Current Outpatient Prescriptions  Medication Sig Dispense Refill  . alendronate (FOSAMAX) 35 MG tablet Take 35 mg by mouth every 7 (seven) days. Take with a full glass of water on an empty stomach.      Marland Kitchen. amoxicillin (AMOXIL) 500 MG capsule as needed.       Marland Kitchen. aspirin 81 MG tablet Take 81 mg by mouth as directed. 1 TABLET Monday, Wednesday, AND Friday ONLY      . atorvastatin (LIPITOR) 10 MG tablet Take 10 mg by mouth daily.      . Calcium Carbonate-Vitamin D (CALCIUM + D PO) Take by mouth 2 (two) times daily.        . Coenzyme Q10 (COQ10 PO) Take by mouth daily.      . metoprolol succinate (TOPROL XL) 25 MG 24 hr tablet Take 0.5 tablets (12.5 mg total) by mouth daily.  90 tablet  3  . Omega-3 Fatty Acids (FISH OIL PO) Take 2,000 mg by mouth 2 (two) times daily.        . ramipril (ALTACE) 2.5 MG capsule Take 1 capsule (2.5 mg total) by mouth daily.  90 capsule  3  . warfarin (COUMADIN) 5 MG tablet Take as directed by coumadin clinic  90 tablet  2   No current facility-administered medications for this visit.    Allergies  Allergen Reactions  . Crestor [Rosuvastatin Calcium]     Patient Active Problem List   Diagnosis Date Noted  . Atrial flutter  03/10/2011    Priority: Medium  . Hypercholesterolemia 03/10/2011    Priority: Medium  . Palpitations     Priority: Medium  . Encounter for therapeutic drug monitoring 12/25/2013  . Malaise and fatigue 06/01/2011  . Encounter for long-term (current) use of anticoagulants 03/10/2011  . Benign hypertensive heart disease without heart failure 03/10/2011  . Paroxysmal atrial flutter   . PAC (premature atrial contraction)   . PVC's (premature ventricular contractions)   . Malaise   . Fatigue   . Myalgia     History  Smoking status  . Former Smoker  . Quit date: 03/09/1991  Smokeless tobacco  . Not on file    History  Alcohol Use No    No family history on file.  Review of Systems: The patient denies any heat or cold intolerance.  No weight gain or weight loss.  The patient denies headaches or blurry vision.  There is no cough or sputum production.  The patient denies dizziness.  There is no hematuria or hematochezia.  The patient denies any muscle aches or arthritis.  The patient denies any rash.  The patient denies frequent falling or instability.  There is no history of depression  or anxiety.  All other systems were reviewed and are negative.   Physical Exam: Filed Vitals:   03/13/14 0733  BP: 118/78  Pulse: 49   the general appearance reveals a thin woman who has lost 3 pounds since last visit.The head and neck exam reveals pupils equal and reactive.  Extraocular movements are full.  There is no scleral icterus.  The mouth and pharynx are normal.  The neck is supple.  The carotids reveal no bruits.  The jugular venous pressure is normal.  The  thyroid is not enlarged.  There is no lymphadenopathy.  The chest is clear to percussion and auscultation.  There are no rales or rhonchi.  Expansion of the chest is symmetrical.  The precordium is quiet.  The first heart sound is normal.  The second heart sound is physiologically split.  There is no murmur gallop rub or click.  There is  no abnormal lift or heave.  The abdomen is soft and nontender.  The bowel sounds are normal.  The liver and spleen are not enlarged.  There are no abdominal masses.  There are no abdominal bruits.  Extremities reveal good pedal pulses.  There is no phlebitis or edema.  There is no cyanosis or clubbing.  Strength is normal and symmetrical in all extremities.  There is no lateralizing weakness.  There are no sensory deficits.  The skin is warm and dry.  There is no rash.  EKG today shows atrial flutter with controlled variable ventricular response.  Since 11/13/13, no significant change   Assessment / Plan: Patient continues to do well.  She is in paroxysmal atrial flutter today. Continue same medication.  Recheck in 4 months for office visit and EKG.

## 2014-03-13 NOTE — Assessment & Plan Note (Signed)
The patient has a history of paroxysmal atrial flutter.  She is unaware of her heart rhythm herself.  She continues to walk up to 6 miles a day and a very brisk pace.  She walks 6 miles in 90 minutes.  No chest pain or shortness of breath.  No TIA or stroke symptoms

## 2014-04-24 ENCOUNTER — Ambulatory Visit (INDEPENDENT_AMBULATORY_CARE_PROVIDER_SITE_OTHER): Payer: Medicare Other | Admitting: *Deleted

## 2014-04-24 DIAGNOSIS — Z5181 Encounter for therapeutic drug level monitoring: Secondary | ICD-10-CM

## 2014-04-24 DIAGNOSIS — Z7901 Long term (current) use of anticoagulants: Secondary | ICD-10-CM

## 2014-04-24 DIAGNOSIS — I4892 Unspecified atrial flutter: Secondary | ICD-10-CM

## 2014-04-24 LAB — POCT INR: INR: 2.6

## 2014-06-05 ENCOUNTER — Ambulatory Visit (INDEPENDENT_AMBULATORY_CARE_PROVIDER_SITE_OTHER): Payer: Medicare Other | Admitting: *Deleted

## 2014-06-05 DIAGNOSIS — Z7901 Long term (current) use of anticoagulants: Secondary | ICD-10-CM

## 2014-06-05 DIAGNOSIS — Z5181 Encounter for therapeutic drug level monitoring: Secondary | ICD-10-CM

## 2014-06-05 DIAGNOSIS — I4892 Unspecified atrial flutter: Secondary | ICD-10-CM

## 2014-06-05 LAB — POCT INR: INR: 4.5

## 2014-06-19 ENCOUNTER — Ambulatory Visit (INDEPENDENT_AMBULATORY_CARE_PROVIDER_SITE_OTHER): Payer: Medicare Other

## 2014-06-19 DIAGNOSIS — I4892 Unspecified atrial flutter: Secondary | ICD-10-CM

## 2014-06-19 DIAGNOSIS — Z7901 Long term (current) use of anticoagulants: Secondary | ICD-10-CM

## 2014-06-19 DIAGNOSIS — Z5181 Encounter for therapeutic drug level monitoring: Secondary | ICD-10-CM

## 2014-06-19 LAB — POCT INR: INR: 1.9

## 2014-06-27 ENCOUNTER — Encounter: Payer: Self-pay | Admitting: Cardiology

## 2014-07-10 ENCOUNTER — Ambulatory Visit (INDEPENDENT_AMBULATORY_CARE_PROVIDER_SITE_OTHER): Payer: Medicare Other | Admitting: Pharmacist Clinician (PhC)/ Clinical Pharmacy Specialist

## 2014-07-10 DIAGNOSIS — Z7901 Long term (current) use of anticoagulants: Secondary | ICD-10-CM

## 2014-07-10 DIAGNOSIS — I4892 Unspecified atrial flutter: Secondary | ICD-10-CM

## 2014-07-10 DIAGNOSIS — Z5181 Encounter for therapeutic drug level monitoring: Secondary | ICD-10-CM

## 2014-07-10 LAB — POCT INR: INR: 3.4

## 2014-07-16 ENCOUNTER — Ambulatory Visit (INDEPENDENT_AMBULATORY_CARE_PROVIDER_SITE_OTHER): Payer: Medicare Other | Admitting: Cardiology

## 2014-07-16 ENCOUNTER — Encounter: Payer: Self-pay | Admitting: Cardiology

## 2014-07-16 VITALS — BP 124/64 | HR 60 | Ht 67.5 in | Wt 108.0 lb

## 2014-07-16 DIAGNOSIS — G4762 Sleep related leg cramps: Secondary | ICD-10-CM

## 2014-07-16 DIAGNOSIS — I119 Hypertensive heart disease without heart failure: Secondary | ICD-10-CM

## 2014-07-16 DIAGNOSIS — I4892 Unspecified atrial flutter: Secondary | ICD-10-CM

## 2014-07-16 DIAGNOSIS — R002 Palpitations: Secondary | ICD-10-CM

## 2014-07-16 HISTORY — DX: Sleep related leg cramps: G47.62

## 2014-07-16 NOTE — Assessment & Plan Note (Signed)
The patient has not been experiencing any racing of her heart .  She has not had any dizziness or syncope .  She has not had any TIA symptoms .

## 2014-07-16 NOTE — Assessment & Plan Note (Signed)
She has been having some nocturnal leg cramps.  She may not be drinking enough water.  She will also try eating yellow mustard at bedtime.

## 2014-07-16 NOTE — Assessment & Plan Note (Signed)
Her blood pressure was remaining stable on current therapy.  No headaches or dizziness

## 2014-07-16 NOTE — Patient Instructions (Signed)
**Note De-Identified  Obfuscation** Your physician has recommended you make the following change in your medication: stop taking Aspirin  Your physician recommends that you return for lab work in: at next follow up in 4 months  Your physician wants you to follow-up in: 4 months.You will receive a reminder letter in the mail two months in advance. If you don't receive a letter, please call our office to schedule the follow-up appointment.

## 2014-07-16 NOTE — Progress Notes (Signed)
[name] Weber Date of Birth:  July 16, 1931 [facility] [address] [place], [place]  [address] [phone]        Fax   [phone]   History of Present Illness: This pleasant 80 year old woman is seen for a four-month followup office visit. She has a past history of paroxysmal atrial fibrillation. She is on long-term Coumadin. She also has a history of hypercholesterolemia. Dr. Cyndia Bent is her primary care provider and follows her cholesterols. Since last visit she has been feeling well. She still walks about 4-6 miles most days of the week. She walks at a brisk pace of a 15 minute mile. She has not been aware of any recent palpitations. At her last visit she was found to be in atrial flutter with controlled ventricular response which was asymptomatic.  Since last visit she has been feeling well.  She has had some low back pain in the morning which works itself out and she is active during the day.  She has occasional nocturnal leg cramps.   Current Outpatient Prescriptions  Medication Sig Dispense Refill  . alendronate (FOSAMAX) 35 MG tablet Take 35 mg by mouth every 7 (seven) days. Take with a full glass of water on an empty stomach.      Marland Kitchen amoxicillin (AMOXIL) 500 MG capsule as needed.       Marland Kitchen atorvastatin (LIPITOR) 10 MG tablet Take 10 mg by mouth daily.      . Calcium Carbonate-Vitamin D (CALCIUM + D PO) Take by mouth 2 (two) times daily.        . Coenzyme Q10 (COQ10 PO) Take by mouth daily.      . metoprolol succinate (TOPROL XL) 25 MG 24 hr tablet Take 0.5 tablets (12.5 mg total) by mouth daily.  90 tablet  3  . Omega-3 Fatty Acids (FISH OIL PO) Take 2,000 mg by mouth 2 (two) times daily.        . ramipril (ALTACE) 2.5 MG capsule Take 1 capsule (2.5 mg total) by mouth daily.  90 capsule  3  . warfarin (COUMADIN) 5 MG tablet Take as directed by coumadin clinic  90 tablet  2   No current facility-administered medications for this visit.    Allergies    Allergen Reactions  . Crestor [Rosuvastatin Calcium]     Patient Active Problem List   Diagnosis Date Noted  . Atrial flutter 03/10/2011    Priority: Medium  . Hypercholesterolemia 03/10/2011    Priority: Medium  . Palpitations     Priority: Medium  . Nocturnal leg cramps 07/16/2014  . Encounter for therapeutic drug monitoring 12/25/2013  . Malaise and fatigue 06/01/2011  . Encounter for long-term (current) use of anticoagulants 03/10/2011  . Benign hypertensive heart disease without heart failure 03/10/2011  . Paroxysmal atrial flutter   . PAC (premature atrial contraction)   . PVC's (premature ventricular contractions)   . Malaise   . Fatigue   . Myalgia     History  Smoking status  . Former Smoker  . Quit date: 03/09/1991  Smokeless tobacco  . Not on file    History  Alcohol Use No    No family history on file.  Review of Systems: Constitutional: no fever chills diaphoresis or fatigue or change in weight.  Head and neck: no hearing loss, no epistaxis, no photophobia or visual disturbance. Respiratory: No cough, shortness of breath or wheezing. Cardiovascular: No chest pain peripheral edema, palpitations. Gastrointestinal: No abdominal distention,  no abdominal pain, no change in bowel habits hematochezia or melena. Genitourinary: No dysuria, no frequency, no urgency, no nocturia. Musculoskeletal:No arthralgias, no back pain, no gait disturbance or myalgias. Neurological: No dizziness, no headaches, no numbness, no seizures, no syncope, no weakness, no tremors. Hematologic: No lymphadenopathy, no easy bruising. Psychiatric: No confusion, no hallucinations, no sleep disturbance.    Physical Exam: Filed Vitals:   07/16/14 0758  BP: 124/64  Pulse: 60   the general appearance reveals a thin well-developed well-nourished woman in no distress.The head and neck exam reveals pupils equal and reactive.  Extraocular movements are full.  There is no scleral icterus.   The mouth and pharynx are normal.  The neck is supple.  The carotids reveal no bruits.  The jugular venous pressure is normal.  The  thyroid is not enlarged.  There is no lymphadenopathy.  The chest is clear to percussion and auscultation.  There are no rales or rhonchi.  Expansion of the chest is symmetrical.  The precordium is quiet.  The first heart sound is normal.  The second heart sound is physiologically split.  There is no murmur gallop rub or click.  There is no abnormal lift or heave.  The abdomen is soft and nontender.  The bowel sounds are normal.  The liver and spleen are not enlarged.  There are no abdominal masses.  There are no abdominal bruits.  Extremities reveal good pedal pulses.  There is no phlebitis or edema.  There is no cyanosis or clubbing.  Strength is normal and symmetrical in all extremities.  There is no lateralizing weakness.  There are no sensory deficits.  The skin is warm and dry.  There is no rash.     Assessment / Plan: 1. paroxysmal atrial flutter fibrillation 2. essential hypertension without heart failure 3. leg cramps, nocturnal 4. hypercholesterolemia followed by her PCP Dr. Cyndia BentBadger   Plan: Continue same medication.  Try to drink more water.  She should eat a banana daily for potassium. Recheck in 4 months for office visit, EKG, and basal metabolic panel.

## 2014-07-16 NOTE — Addendum Note (Signed)
**Note De-Identified Tavio Biegel Obfuscation** Addended by: Demetrios LollVIA, Andree Heeg M on: 07/16/2014 04:26 PM   Modules accepted: Orders

## 2014-07-24 ENCOUNTER — Ambulatory Visit (INDEPENDENT_AMBULATORY_CARE_PROVIDER_SITE_OTHER): Payer: Medicare Other | Admitting: *Deleted

## 2014-07-24 DIAGNOSIS — Z5181 Encounter for therapeutic drug level monitoring: Secondary | ICD-10-CM

## 2014-07-24 DIAGNOSIS — Z7901 Long term (current) use of anticoagulants: Secondary | ICD-10-CM

## 2014-07-24 DIAGNOSIS — I4892 Unspecified atrial flutter: Secondary | ICD-10-CM

## 2014-07-24 LAB — POCT INR: INR: 3.8

## 2014-08-13 ENCOUNTER — Ambulatory Visit (INDEPENDENT_AMBULATORY_CARE_PROVIDER_SITE_OTHER): Payer: Medicare Other

## 2014-08-13 DIAGNOSIS — I4892 Unspecified atrial flutter: Secondary | ICD-10-CM

## 2014-08-13 DIAGNOSIS — Z7901 Long term (current) use of anticoagulants: Secondary | ICD-10-CM

## 2014-08-13 DIAGNOSIS — Z5181 Encounter for therapeutic drug level monitoring: Secondary | ICD-10-CM

## 2014-08-13 LAB — POCT INR: INR: 2.9

## 2014-08-24 ENCOUNTER — Other Ambulatory Visit: Payer: Self-pay

## 2014-08-24 DIAGNOSIS — Z1231 Encounter for screening mammogram for malignant neoplasm of breast: Secondary | ICD-10-CM

## 2014-08-27 ENCOUNTER — Other Ambulatory Visit: Payer: Self-pay | Admitting: Physician Assistant

## 2014-08-27 DIAGNOSIS — M810 Age-related osteoporosis without current pathological fracture: Secondary | ICD-10-CM

## 2014-09-03 ENCOUNTER — Ambulatory Visit (INDEPENDENT_AMBULATORY_CARE_PROVIDER_SITE_OTHER): Payer: Medicare Other | Admitting: *Deleted

## 2014-09-03 DIAGNOSIS — Z7901 Long term (current) use of anticoagulants: Secondary | ICD-10-CM

## 2014-09-03 DIAGNOSIS — I4892 Unspecified atrial flutter: Secondary | ICD-10-CM

## 2014-09-03 DIAGNOSIS — Z5181 Encounter for therapeutic drug level monitoring: Secondary | ICD-10-CM

## 2014-09-03 LAB — POCT INR: INR: 2.3

## 2014-10-01 ENCOUNTER — Ambulatory Visit (INDEPENDENT_AMBULATORY_CARE_PROVIDER_SITE_OTHER): Payer: Medicare Other | Admitting: *Deleted

## 2014-10-01 ENCOUNTER — Ambulatory Visit
Admission: RE | Admit: 2014-10-01 | Discharge: 2014-10-01 | Disposition: A | Discharge status: Home / Self Care | Payer: Medicare Other | Source: Ambulatory Visit

## 2014-10-01 ENCOUNTER — Ambulatory Visit
Admission: RE | Admit: 2014-10-01 | Discharge: 2014-10-01 | Disposition: A | Discharge status: Home / Self Care | Payer: Medicare Other | Source: Ambulatory Visit | Attending: Physician Assistant | Admitting: Physician Assistant

## 2014-10-01 DIAGNOSIS — M810 Age-related osteoporosis without current pathological fracture: Secondary | ICD-10-CM

## 2014-10-01 DIAGNOSIS — I4892 Unspecified atrial flutter: Secondary | ICD-10-CM

## 2014-10-01 DIAGNOSIS — Z5181 Encounter for therapeutic drug level monitoring: Secondary | ICD-10-CM

## 2014-10-01 DIAGNOSIS — Z1231 Encounter for screening mammogram for malignant neoplasm of breast: Secondary | ICD-10-CM

## 2014-10-01 DIAGNOSIS — Z7901 Long term (current) use of anticoagulants: Secondary | ICD-10-CM

## 2014-10-01 LAB — POCT INR: INR: 2.1

## 2014-10-29 ENCOUNTER — Ambulatory Visit (INDEPENDENT_AMBULATORY_CARE_PROVIDER_SITE_OTHER): Payer: Medicare Other | Admitting: *Deleted

## 2014-10-29 DIAGNOSIS — I4892 Unspecified atrial flutter: Secondary | ICD-10-CM

## 2014-10-29 DIAGNOSIS — Z7901 Long term (current) use of anticoagulants: Secondary | ICD-10-CM

## 2014-10-29 DIAGNOSIS — Z5181 Encounter for therapeutic drug level monitoring: Secondary | ICD-10-CM

## 2014-10-29 LAB — POCT INR: INR: 2.6

## 2014-11-13 ENCOUNTER — Ambulatory Visit (INDEPENDENT_AMBULATORY_CARE_PROVIDER_SITE_OTHER): Payer: Medicare Other

## 2014-11-13 ENCOUNTER — Ambulatory Visit (INDEPENDENT_AMBULATORY_CARE_PROVIDER_SITE_OTHER): Payer: Medicare Other | Admitting: Family Medicine

## 2014-11-13 VITALS — BP 110/70 | HR 65 | Temp 97.5°F | Resp 16 | Ht 66.0 in | Wt 107.0 lb

## 2014-11-13 DIAGNOSIS — R0789 Other chest pain: Secondary | ICD-10-CM

## 2014-11-13 MED ORDER — PREDNISONE 20 MG PO TABS: 40.0000 mg | ORAL_TABLET | Freq: Every day | ORAL | Status: DC

## 2014-11-13 NOTE — Progress Notes (Signed)
This is an 80 year old woman who was working out at [employer] last Friday (4 days ago) and on one of the machines pushed her chest onto a unmovable object. She had a loud pop in her right costosternal junction, lower one half, with immediate pain.  This seemed to resolve for 24 hours but has gradually gotten worse now over the last several days such that she can't take a deep breath or cough without having excruciating pain in her right lower sternum. The pain is also present radiating around the left lower ribs.  Patient is taking Coumadin and a variety of other medicines that she has not taken anything for pain control.  Objective: Patient is alert and in no acute distress HEENT: Unremarkable Neck: Supple no adenopathy Chest: Clear to auscultation very tender in the right lower costosternal junction Heart: Regular no murmur Skin: No rash  [facility] reading (PRIMARY) by  Dr. Lurlean LeydenLauenstein-chest x-ray shows hyperinflation, mild calcification of the major bronchial tubes.  This chart was scribed in my presence and reviewed by me personally.    ICD-9-CM ICD-10-CM   1. Chest wall pain 786.52 R07.89 DG Chest 2 View     predniSONE (DELTASONE) 20 MG tablet   Chest wall pain - Plan: DG Chest 2 View, predniSONE (DELTASONE) 20 MG tablet    Signed, Elvina SidleKurt Nkenge Sonntag, MD

## 2014-11-13 NOTE — Patient Instructions (Signed)
Please call me if by Friday the pain has not subsided.

## 2014-11-16 ENCOUNTER — Emergency Department (HOSPITAL_COMMUNITY): Payer: Medicare Other | Admitting: Anesthesiology

## 2014-11-16 ENCOUNTER — Encounter (HOSPITAL_COMMUNITY): Payer: Self-pay | Admitting: Emergency Medicine

## 2014-11-16 ENCOUNTER — Emergency Department (HOSPITAL_COMMUNITY): Payer: Medicare Other

## 2014-11-16 ENCOUNTER — Inpatient Hospital Stay (HOSPITAL_COMMUNITY)
Admission: EM | Admit: 2014-11-16 | Discharge: 2014-11-29 | DRG: 025 | Disposition: A | Discharge status: Other Acute Inpatient Hospital | Payer: Medicare Other | Attending: Neurosurgery | Admitting: Neurosurgery

## 2014-11-16 ENCOUNTER — Encounter (HOSPITAL_COMMUNITY)
Admission: EM | Disposition: A | Discharge status: Other Acute Inpatient Hospital | Payer: Self-pay | Source: Home / Self Care | Attending: Neurosurgery

## 2014-11-16 DIAGNOSIS — S065XAA Traumatic subdural hemorrhage with loss of consciousness status unknown, initial encounter: Secondary | ICD-10-CM

## 2014-11-16 DIAGNOSIS — Z7983 Long term (current) use of bisphosphonates: Secondary | ICD-10-CM

## 2014-11-16 DIAGNOSIS — G839 Paralytic syndrome, unspecified: Secondary | ICD-10-CM

## 2014-11-16 DIAGNOSIS — J90 Pleural effusion, not elsewhere classified: Secondary | ICD-10-CM | POA: Diagnosis not present

## 2014-11-16 DIAGNOSIS — I4892 Unspecified atrial flutter: Secondary | ICD-10-CM | POA: Diagnosis present

## 2014-11-16 DIAGNOSIS — R112 Nausea with vomiting, unspecified: Secondary | ICD-10-CM

## 2014-11-16 DIAGNOSIS — I951 Orthostatic hypotension: Secondary | ICD-10-CM | POA: Diagnosis present

## 2014-11-16 DIAGNOSIS — I481 Persistent atrial fibrillation: Secondary | ICD-10-CM | POA: Diagnosis present

## 2014-11-16 DIAGNOSIS — Z888 Allergy status to other drugs, medicaments and biological substances status: Secondary | ICD-10-CM

## 2014-11-16 DIAGNOSIS — I4819 Other persistent atrial fibrillation: Secondary | ICD-10-CM

## 2014-11-16 DIAGNOSIS — I471 Supraventricular tachycardia: Secondary | ICD-10-CM | POA: Diagnosis present

## 2014-11-16 DIAGNOSIS — R52 Pain, unspecified: Secondary | ICD-10-CM

## 2014-11-16 DIAGNOSIS — R109 Unspecified abdominal pain: Secondary | ICD-10-CM

## 2014-11-16 DIAGNOSIS — S065X9A Traumatic subdural hemorrhage with loss of consciousness of unspecified duration, initial encounter: Secondary | ICD-10-CM

## 2014-11-16 DIAGNOSIS — R55 Syncope and collapse: Secondary | ICD-10-CM | POA: Diagnosis present

## 2014-11-16 DIAGNOSIS — M549 Dorsalgia, unspecified: Secondary | ICD-10-CM | POA: Diagnosis not present

## 2014-11-16 DIAGNOSIS — Z7901 Long term (current) use of anticoagulants: Secondary | ICD-10-CM

## 2014-11-16 DIAGNOSIS — Z79899 Other long term (current) drug therapy: Secondary | ICD-10-CM

## 2014-11-16 DIAGNOSIS — E780 Pure hypercholesterolemia: Secondary | ICD-10-CM | POA: Diagnosis present

## 2014-11-16 DIAGNOSIS — I2699 Other pulmonary embolism without acute cor pulmonale: Secondary | ICD-10-CM | POA: Diagnosis not present

## 2014-11-16 DIAGNOSIS — R32 Unspecified urinary incontinence: Secondary | ICD-10-CM | POA: Diagnosis present

## 2014-11-16 DIAGNOSIS — R29898 Other symptoms and signs involving the musculoskeletal system: Secondary | ICD-10-CM

## 2014-11-16 DIAGNOSIS — S065X0A Traumatic subdural hemorrhage without loss of consciousness, initial encounter: Principal | ICD-10-CM | POA: Diagnosis present

## 2014-11-16 DIAGNOSIS — E785 Hyperlipidemia, unspecified: Secondary | ICD-10-CM | POA: Diagnosis present

## 2014-11-16 DIAGNOSIS — R1031 Right lower quadrant pain: Secondary | ICD-10-CM

## 2014-11-16 DIAGNOSIS — Z7952 Long term (current) use of systemic steroids: Secondary | ICD-10-CM

## 2014-11-16 DIAGNOSIS — G8220 Paraplegia, unspecified: Secondary | ICD-10-CM | POA: Diagnosis present

## 2014-11-16 DIAGNOSIS — S065X0D Traumatic subdural hemorrhage without loss of consciousness, subsequent encounter: Secondary | ICD-10-CM

## 2014-11-16 DIAGNOSIS — IMO0002 Reserved for concepts with insufficient information to code with codable children: Secondary | ICD-10-CM | POA: Insufficient documentation

## 2014-11-16 DIAGNOSIS — I82419 Acute embolism and thrombosis of unspecified femoral vein: Secondary | ICD-10-CM | POA: Diagnosis not present

## 2014-11-16 DIAGNOSIS — Z87891 Personal history of nicotine dependence: Secondary | ICD-10-CM

## 2014-11-16 HISTORY — PX: THORACIC LAMINECTOMY FOR EPIDURAL ABSCESS: SHX6115

## 2014-11-16 LAB — URINALYSIS, ROUTINE W REFLEX MICROSCOPIC
Bilirubin Urine: NEGATIVE
Glucose, UA: 100 mg/dL — AB
Ketones, ur: NEGATIVE mg/dL
Leukocytes, UA: NEGATIVE
Nitrite: NEGATIVE
Protein, ur: NEGATIVE mg/dL
Specific Gravity, Urine: 1.02 (ref 1.005–1.030)
Urobilinogen, UA: 0.2 mg/dL (ref 0.0–1.0)
pH: 6.5 (ref 5.0–8.0)

## 2014-11-16 LAB — CBC
HCT: 42.7 % (ref 36.0–46.0)
Hemoglobin: 13.7 g/dL (ref 12.0–15.0)
MCH: 31.9 pg (ref 26.0–34.0)
MCHC: 32.1 g/dL (ref 30.0–36.0)
MCV: 99.5 fL (ref 78.0–100.0)
Platelets: 156 10*3/uL (ref 150–400)
RBC: 4.29 MIL/uL (ref 3.87–5.11)
RDW: 12.6 % (ref 11.5–15.5)
WBC: 10.6 10*3/uL — ABNORMAL HIGH (ref 4.0–10.5)

## 2014-11-16 LAB — PROTIME-INR
INR: 3.16 — ABNORMAL HIGH (ref 0.00–1.49)
Prothrombin Time: 32.6 seconds — ABNORMAL HIGH (ref 11.6–15.2)

## 2014-11-16 LAB — URINE MICROSCOPIC-ADD ON

## 2014-11-16 LAB — COMPREHENSIVE METABOLIC PANEL
ALT: 33 U/L (ref 0–35)
AST: 41 U/L — ABNORMAL HIGH (ref 0–37)
Albumin: 3.6 g/dL (ref 3.5–5.2)
Alkaline Phosphatase: 56 U/L (ref 39–117)
Anion gap: 13 (ref 5–15)
BUN: 17 mg/dL (ref 6–23)
CO2: 28 mEq/L (ref 19–32)
Calcium: 9.4 mg/dL (ref 8.4–10.5)
Chloride: 101 mEq/L (ref 96–112)
Creatinine, Ser: 0.6 mg/dL (ref 0.50–1.10)
GFR calc Af Amer: >90 mL/min (ref >90)
GFR calc non Af Amer: 83 mL/min — ABNORMAL LOW (ref >90)
Glucose, Bld: 162 mg/dL — ABNORMAL HIGH (ref 70–99)
Potassium: 4.4 mEq/L (ref 3.7–5.3)
Sodium: 142 mEq/L (ref 137–147)
Total Bilirubin: 0.6 mg/dL (ref 0.3–1.2)
Total Protein: 7 g/dL (ref 6.0–8.3)

## 2014-11-16 LAB — PREPARE RBC (CROSSMATCH)

## 2014-11-16 LAB — ABO/RH: ABO/RH(D): A POS

## 2014-11-16 SURGERY — THORACIC LAMINECTOMY FOR EPIDURAL ABSCESS
Anesthesia: General

## 2014-11-16 MED ORDER — BUPIVACAINE HCL 0.5 % IJ SOLN
INTRAMUSCULAR | Status: DC | PRN
  Administered 2014-11-16: 10 mL

## 2014-11-16 MED ORDER — SODIUM CHLORIDE 0.9 % IV SOLN
INTRAVENOUS | Status: DC | PRN
  Administered 2014-11-16 (×2): via INTRAVENOUS

## 2014-11-16 MED ORDER — SODIUM CHLORIDE 0.9 % IV SOLN
10.0000 mg | INTRAVENOUS | Status: DC | PRN
  Administered 2014-11-16: 15 ug/min via INTRAVENOUS

## 2014-11-16 MED ORDER — ONDANSETRON HCL 4 MG/2ML IJ SOLN
4.0000 mg | Freq: Once | INTRAMUSCULAR | Status: AC
  Administered 2014-11-16: 4 mg via INTRAVENOUS
  Filled 2014-11-16: qty 2

## 2014-11-16 MED ORDER — METOCLOPRAMIDE HCL 5 MG/ML IJ SOLN
10.0000 mg | Freq: Once | INTRAMUSCULAR | Status: AC
  Administered 2014-11-16: 10 mg via INTRAVENOUS
  Filled 2014-11-16: qty 2

## 2014-11-16 MED ORDER — MENTHOL 3 MG MT LOZG: 1.0000 | LOZENGE | OROMUCOSAL | Status: DC | PRN

## 2014-11-16 MED ORDER — ONDANSETRON HCL 4 MG/2ML IJ SOLN: 4.0000 mg | Freq: Once | INTRAMUSCULAR | Status: AC | PRN

## 2014-11-16 MED ORDER — NEOSTIGMINE METHYLSULFATE 10 MG/10ML IV SOLN
INTRAVENOUS | Status: DC | PRN
  Administered 2014-11-16: 2.5 mg via INTRAVENOUS

## 2014-11-16 MED ORDER — GLYCOPYRROLATE 0.2 MG/ML IJ SOLN
INTRAMUSCULAR | Status: DC | PRN
  Administered 2014-11-16: 0.3 mg via INTRAVENOUS

## 2014-11-16 MED ORDER — SODIUM CHLORIDE 0.9 % IV SOLN: 10.0000 mL/h | Freq: Once | INTRAVENOUS | Status: DC

## 2014-11-16 MED ORDER — VECURONIUM BROMIDE 10 MG IV SOLR
INTRAVENOUS | Status: DC | PRN
  Administered 2014-11-16: 4 mg via INTRAVENOUS

## 2014-11-16 MED ORDER — DEXAMETHASONE SODIUM PHOSPHATE 4 MG/ML IJ SOLN
INTRAMUSCULAR | Status: DC | PRN
  Administered 2014-11-16: 8 mg via INTRAVENOUS

## 2014-11-16 MED ORDER — ONDANSETRON HCL 4 MG/2ML IJ SOLN
INTRAMUSCULAR | Status: DC | PRN
Start: 2014-11-16 — End: 2014-11-16
  Administered 2014-11-16: 4 mg via INTRAVENOUS

## 2014-11-16 MED ORDER — MORPHINE SULFATE 4 MG/ML IJ SOLN
6.0000 mg | Freq: Once | INTRAMUSCULAR | Status: AC
  Administered 2014-11-16: 6 mg via INTRAVENOUS
  Filled 2014-11-16: qty 2

## 2014-11-16 MED ORDER — THROMBIN 20000 UNITS EX SOLR
CUTANEOUS | Status: DC | PRN
  Administered 2014-11-16: 21:00:00 via TOPICAL

## 2014-11-16 MED ORDER — ATORVASTATIN CALCIUM 10 MG PO TABS
10.0000 mg | ORAL_TABLET | Freq: Every day | ORAL | Status: DC
  Administered 2014-11-17 – 2014-11-29 (×11): 10 mg via ORAL
  Filled 2014-11-16 (×13): qty 1

## 2014-11-16 MED ORDER — LIDOCAINE HCL (CARDIAC) 20 MG/ML IV SOLN
INTRAVENOUS | Status: DC | PRN
  Administered 2014-11-16: 100 mg via INTRAVENOUS

## 2014-11-16 MED ORDER — GADOBENATE DIMEGLUMINE 529 MG/ML IV SOLN
10.0000 mL | Freq: Once | INTRAVENOUS | Status: AC | PRN
  Administered 2014-11-16: 10 mL via INTRAVENOUS

## 2014-11-16 MED ORDER — DEXAMETHASONE SODIUM PHOSPHATE 4 MG/ML IJ SOLN
INTRAMUSCULAR | Status: AC
  Filled 2014-11-16: qty 2

## 2014-11-16 MED ORDER — FENTANYL CITRATE 0.05 MG/ML IJ SOLN
INTRAMUSCULAR | Status: AC
  Filled 2014-11-16: qty 5

## 2014-11-16 MED ORDER — PROTHROMBIN COMPLEX CONC HUMAN 500 UNITS IV KIT
25.0000 [IU]/kg | PACK | Status: DC
  Filled 2014-11-16: qty 49

## 2014-11-16 MED ORDER — HYDROMORPHONE HCL 1 MG/ML IJ SOLN: 0.2500 mg | INTRAMUSCULAR | Status: DC | PRN

## 2014-11-16 MED ORDER — PROPOFOL 10 MG/ML IV BOLUS
INTRAVENOUS | Status: DC | PRN
  Administered 2014-11-16: 120 mg via INTRAVENOUS

## 2014-11-16 MED ORDER — CEFAZOLIN SODIUM-DEXTROSE 2-3 GM-% IV SOLR
INTRAVENOUS | Status: DC | PRN
  Administered 2014-11-16: 2 g via INTRAVENOUS

## 2014-11-16 MED ORDER — ONDANSETRON 8 MG PO TBDP: 8.0000 mg | ORAL_TABLET | Freq: Three times a day (TID) | ORAL | Status: DC | PRN

## 2014-11-16 MED ORDER — METOPROLOL SUCCINATE ER 25 MG PO TB24
12.5000 mg | ORAL_TABLET | Freq: Every day | ORAL | Status: DC
  Administered 2014-11-17 – 2014-11-19 (×3): 12.5 mg via ORAL
  Filled 2014-11-16 (×5): qty 1

## 2014-11-16 MED ORDER — LACTATED RINGERS IV SOLN
INTRAVENOUS | Status: DC | PRN
  Administered 2014-11-16: 20:00:00 via INTRAVENOUS

## 2014-11-16 MED ORDER — SODIUM BICARBONATE 8.4 % IV SOLN
INTRAVENOUS | Status: DC | PRN
  Administered 2014-11-16: 25 meq via INTRAVENOUS

## 2014-11-16 MED ORDER — ARTIFICIAL TEARS OP OINT
TOPICAL_OINTMENT | OPHTHALMIC | Status: DC | PRN
  Administered 2014-11-16: 1 via OPHTHALMIC

## 2014-11-16 MED ORDER — DIAZEPAM 5 MG PO TABS
5.0000 mg | ORAL_TABLET | Freq: Once | ORAL | Status: AC
  Administered 2014-11-16: 5 mg via ORAL
  Filled 2014-11-16: qty 1

## 2014-11-16 MED ORDER — PROTHROMBIN COMPLEX CONC HUMAN 500 UNITS IV KIT
1569.0000 [IU] | PACK | INTRAVENOUS | Status: AC
  Administered 2014-11-16: 1569 [IU] via INTRAVENOUS
  Filled 2014-11-16: qty 63

## 2014-11-16 MED ORDER — GELATIN ABSORBABLE MT POWD
OROMUCOSAL | Status: DC | PRN
  Administered 2014-11-16 (×3): via TOPICAL

## 2014-11-16 MED ORDER — PHENOL 1.4 % MT LIQD: 1.0000 | OROMUCOSAL | Status: DC | PRN

## 2014-11-16 MED ORDER — VITAMIN K1 10 MG/ML IJ SOLN
10.0000 mg | INTRAVENOUS | Status: DC
  Filled 2014-11-16: qty 1

## 2014-11-16 MED ORDER — ALBUMIN HUMAN 5 % IV SOLN
INTRAVENOUS | Status: DC | PRN
  Administered 2014-11-16 (×2): via INTRAVENOUS

## 2014-11-16 MED ORDER — VITAMIN K1 10 MG/ML IJ SOLN
10.0000 mg | INTRAVENOUS | Status: AC
  Administered 2014-11-16: 10 mg via INTRAVENOUS
  Filled 2014-11-16: qty 1

## 2014-11-16 MED ORDER — RAMIPRIL 2.5 MG PO CAPS
2.5000 mg | ORAL_CAPSULE | Freq: Every day | ORAL | Status: DC
  Administered 2014-11-17 – 2014-11-19 (×3): 2.5 mg via ORAL
  Filled 2014-11-16 (×7): qty 1

## 2014-11-16 MED ORDER — KETOROLAC TROMETHAMINE 30 MG/ML IJ SOLN
30.0000 mg | Freq: Once | INTRAMUSCULAR | Status: AC
  Administered 2014-11-16: 30 mg via INTRAVENOUS
  Filled 2014-11-16: qty 1

## 2014-11-16 MED ORDER — VITAMIN K1 10 MG/ML IJ SOLN
INTRAMUSCULAR | Status: DC | PRN
  Administered 2014-11-16: 10 mg via SUBCUTANEOUS

## 2014-11-16 MED ORDER — 0.9 % SODIUM CHLORIDE (POUR BTL) OPTIME
TOPICAL | Status: DC | PRN
  Administered 2014-11-16 (×2): 1000 mL

## 2014-11-16 MED ORDER — FENTANYL CITRATE 0.05 MG/ML IJ SOLN
INTRAMUSCULAR | Status: DC | PRN
  Administered 2014-11-16: 150 ug via INTRAVENOUS

## 2014-11-16 MED ORDER — HYDROCODONE-ACETAMINOPHEN 5-325 MG PO TABS: 1.0000 | ORAL_TABLET | ORAL | Status: DC | PRN

## 2014-11-16 SURGICAL SUPPLY — 40 items
BUR ACORN 6.0 ACORN (BURR) ×2 IMPLANT
BUR ACORN 6.0MM ACORN (BURR) ×1
BUR MATCHSTICK NEURO 3.0 LAGG (BURR) ×3 IMPLANT
CANISTER SUCT 3000ML (MISCELLANEOUS) ×3 IMPLANT
CONT SPEC 4OZ CLIKSEAL STRL BL (MISCELLANEOUS) ×3 IMPLANT
CONT SPEC STER OR (MISCELLANEOUS) ×3 IMPLANT
DRAPE LAPAROTOMY T 102X78X121 (DRAPES) ×3 IMPLANT
DRAPE MICROSCOPE LEICA (MISCELLANEOUS) ×3 IMPLANT
DRAPE PROXIMA HALF (DRAPES) ×3 IMPLANT
DRSG OPSITE POSTOP 4X6 (GAUZE/BANDAGES/DRESSINGS) ×6 IMPLANT
DURAMATRIX ONLAY 2X2 (Neuro Prosthesis/Implant) ×3 IMPLANT
GAUZE SPONGE 4X4 12PLY STRL (GAUZE/BANDAGES/DRESSINGS) ×3 IMPLANT
GAUZE SPONGE 4X4 16PLY XRAY LF (GAUZE/BANDAGES/DRESSINGS) ×3 IMPLANT
GLOVE BIO SURGEON STRL SZ 6.5 (GLOVE) ×4 IMPLANT
GLOVE BIO SURGEONS STRL SZ 6.5 (GLOVE) ×2
GLOVE BIOGEL M 8.0 STRL (GLOVE) ×6 IMPLANT
GLOVE BIOGEL PI IND STRL 7.5 (GLOVE) ×1 IMPLANT
GLOVE BIOGEL PI INDICATOR 7.5 (GLOVE) ×2
GLOVE ECLIPSE 8.5 STRL (GLOVE) ×6 IMPLANT
GLOVE INDICATOR 8.5 STRL (GLOVE) ×6 IMPLANT
GOWN L4 LG 24 PK N/S (GOWN DISPOSABLE) ×6 IMPLANT
GOWN STRL REUS W/TWL 2XL LVL3 (GOWN DISPOSABLE) ×3 IMPLANT
HEMOSTAT POWDER KIT SURGIFOAM (HEMOSTASIS) ×9 IMPLANT
KIT BASIN OR (CUSTOM PROCEDURE TRAY) ×3 IMPLANT
KIT ROOM TURNOVER OR (KITS) ×3 IMPLANT
NEEDLE HYPO 22GX1.5 SAFETY (NEEDLE) ×3 IMPLANT
PACK LAMINECTOMY NEURO (CUSTOM PROCEDURE TRAY) ×3 IMPLANT
PATTIES SURGICAL .5 X3 (DISPOSABLE) ×3 IMPLANT
PATTIES SURGICAL 1X1 (DISPOSABLE) ×3 IMPLANT
RUBBERBAND STERILE (MISCELLANEOUS) ×3 IMPLANT
SPONGE SURGIFOAM ABS GEL SZ50 (HEMOSTASIS) ×3 IMPLANT
SUT NURALON 4 0 TR CR/8 (SUTURE) ×6 IMPLANT
SUT PROLENE 6 0 BV (SUTURE) ×9 IMPLANT
SUT VIC AB 2-0 CP2 18 (SUTURE) ×6 IMPLANT
SUT VIC AB 3-0 SH 8-18 (SUTURE) ×3 IMPLANT
SYR 20ML ECCENTRIC (SYRINGE) ×3 IMPLANT
TOWEL OR 17X24 6PK STRL BLUE (TOWEL DISPOSABLE) ×3 IMPLANT
TOWEL OR 17X26 10 PK STRL BLUE (TOWEL DISPOSABLE) ×3 IMPLANT
TRAY FOLEY CATH 14FRSI W/METER (CATHETERS) ×3 IMPLANT
WATER STERILE IRR 1000ML POUR (IV SOLUTION) ×3 IMPLANT

## 2014-11-16 NOTE — Addendum Note (Signed)
Addendum  created 11/16/14 2335 by Laverle HobbyGregory Liticia Gasior, MD   Modules edited: Anesthesia Attestations, Anesthesia Events, Anesthesia Review and Sign Navigator Section, Clinical Notes, Narrator, Problem List   Clinical Notes:  File: [id number]; File: [id number]   Narrator:  Narrator: Event Log Edited   Problem List:  Marked As Reviewed

## 2014-11-16 NOTE — H&P (Signed)
[name] Weber is an 80 y.o. female.   Chief Complaint: unable to walk HPI: patient on coumadin who had some flank pain and seen at the urgent center. Brought to the er because being unable to walk. Mri done and we were called as emergency  Past Medical History  Diagnosis Date  . Paroxysmal atrial flutter   . Palpitations     OCCASSIONAL  . PAC (premature atrial contraction)     ISOLATED  . PVC's (premature ventricular contractions)     ISOLATED  . Malaise   . Fatigue   . Myalgia     Past Surgical History  Procedure Laterality Date  . Vulvar lesion removal  01/01/2009    She had a 1-cm area of erythema to the right of the urethral meatus    Family History  Problem Relation Age of Onset  . Hyperlipidemia Mother    Social History:  reports that she quit smoking about 23 years ago. She does not have any smokeless tobacco history on file. She reports that she does not drink alcohol or use illicit drugs.  Allergies:  Allergies  Allergen Reactions  . Crestor [Rosuvastatin Calcium]      (Not in a hospital admission)  Results for orders placed or performed during the hospital encounter of 11/16/14 (from the past 48 hour(s))  CBC     Status: Abnormal   Collection Time: 11/16/14 10:00 AM  Result Value Ref Range   WBC 10.6 (H) 4.0 - 10.5 K/uL   RBC 4.29 3.87 - 5.11 MIL/uL   Hemoglobin 13.7 12.0 - 15.0 g/dL   HCT 42.7 36.0 - 46.0 %   MCV 99.5 78.0 - 100.0 fL   MCH 31.9 26.0 - 34.0 pg   MCHC 32.1 30.0 - 36.0 g/dL   RDW 12.6 11.5 - 15.5 %   Platelets 156 150 - 400 K/uL  Comprehensive metabolic panel     Status: Abnormal   Collection Time: 11/16/14 10:00 AM  Result Value Ref Range   Sodium 142 137 - 147 mEq/L   Potassium 4.4 3.7 - 5.3 mEq/L   Chloride 101 96 - 112 mEq/L   CO2 28 19 - 32 mEq/L   Glucose, Bld 162 (H) 70 - 99 mg/dL   BUN 17 6 - 23 mg/dL   Creatinine, Ser 0.60 0.50 - 1.10 mg/dL   Calcium 9.4 8.4 - 10.5 mg/dL   Total Protein 7.0 6.0 - 8.3 g/dL   Albumin  3.6 3.5 - 5.2 g/dL   AST 41 (H) 0 - 37 U/L   ALT 33 0 - 35 U/L   Alkaline Phosphatase 56 39 - 117 U/L   Total Bilirubin 0.6 0.3 - 1.2 mg/dL   GFR calc non Af Amer 83 (L) >90 mL/min   GFR calc Af Amer >90 >90 mL/min    Comment: (NOTE) The eGFR has been calculated using the CKD EPI equation. This calculation has not been validated in all clinical situations. eGFR's persistently <90 mL/min signify possible Chronic Kidney Disease.    Anion gap 13 5 - 15  Protime-INR     Status: Abnormal   Collection Time: 11/16/14 10:00 AM  Result Value Ref Range   Prothrombin Time 32.6 (H) 11.6 - 15.2 seconds   INR 3.16 (H) 0.00 - 1.49  Urinalysis, Routine w reflex microscopic     Status: Abnormal   Collection Time: 11/16/14 11:41 AM  Result Value Ref Range   Color, Urine YELLOW YELLOW   APPearance CLEAR CLEAR  Specific Gravity, Urine 1.020 1.005 - 1.030   pH 6.5 5.0 - 8.0   Glucose, UA 100 (A) NEGATIVE mg/dL   Hgb urine dipstick SMALL (A) NEGATIVE   Bilirubin Urine NEGATIVE NEGATIVE   Ketones, ur NEGATIVE NEGATIVE mg/dL   Protein, ur NEGATIVE NEGATIVE mg/dL   Urobilinogen, UA 0.2 0.0 - 1.0 mg/dL   Nitrite NEGATIVE NEGATIVE   Leukocytes, UA NEGATIVE NEGATIVE  Urine microscopic-add on     Status: None   Collection Time: 11/16/14 11:41 AM  Result Value Ref Range   WBC, UA 0-2 <3 WBC/hpf   RBC / HPF 0-2 <3 RBC/hpf   Bacteria, UA RARE RARE   Ct Abdomen Pelvis Wo Contrast  11/16/2014   CLINICAL DATA:  Right flank pain for 1 day with nausea and vomiting. Initial encounter.  EXAM: CT ABDOMEN AND PELVIS WITHOUT CONTRAST  TECHNIQUE: Multidetector CT imaging of the abdomen and pelvis was performed following the standard protocol without IV contrast.  COMPARISON:  05/27/2010 abdominal pelvic CT.  FINDINGS: Lower chest: Clear lung bases. No significant pleural or pericardial effusion.  Hepatobiliary: As evaluated in the noncontrast state, the liver appears normal without focal abnormality. No evidence of  gallstones, gallbladder wall thickening or biliary dilatation.  Pancreas: Unremarkable. No pancreatic ductal dilatation or surrounding inflammatory changes.  Spleen: Normal in size without focal abnormality.  Adrenals/Urinary Tract: Both adrenal glands appear normal.There is no evidence of urinary tract calculus or hydronephrosis. This is small parenchymal calcification in the upper pole of the left kidney. A simple cyst posteriorly in the left kidney appears stable. The distal ureters are not well visualized, but no ureteral or bladder calculi are seen.  Stomach/Bowel: No evidence of bowel wall thickening, distention or surrounding inflammatory change.Mild distal colonic diverticular changes are present.  Vascular/Lymphatic: There are no enlarged abdominal or pelvic lymph nodes. There is diffuse aortoiliac atherosclerosis.  Reproductive: Status post hysterectomy. No evidence of adnexal mass.  Other: No evidence of abdominal wall mass or hernia.  Musculoskeletal: No acute or significant osseous findings. There are degenerative changes throughout the lumbar spine associated with a convex left scoliosis.  IMPRESSION: IMPRESSION 1. No evidence of urinary tract calculus or hydronephrosis. 2. No acute findings identified. 3. Diffuse atherosclerosis and mild distal colonic diverticulosis noted.   Electronically Signed   By: Camie Patience M.D.   On: 11/16/2014 10:43   Mr Lumbar Spine Wo Contrast  11/16/2014   CLINICAL DATA:  80 year old female with acute onset severe back pain after pole Oddi is class. Pain radiating anteriorly in the thorax, and progressing. Subsequent progressive weakness in her lower extremities, unable to walk. Current history of atrial fibrillation on Coumadin. Initial encounter.  EXAM: MRI THORACIC AND LUMBAR SPINE WITHOUT AND WITH CONTRAST  TECHNIQUE: Multiplanar and multiecho pulse sequences of the thoracic and lumbar spine were obtained without and with intravenous contrast.  CONTRAST:  51m  MULTIHANCE GADOBENATE DIMEGLUMINE 529 MG/ML IV SOLN  COMPARISON:  CT Abdomen and Pelvis without contrast 1024 hr today, and contrast-enhanced CT Abdomen and Pelvis 05/27/2010.  FINDINGS: MR THORACIC SPINE FINDINGS  Limited sagittal imaging the cervical spine remarkable for widespread chronic disc and endplate degeneration.  Thoracic vertebral height and alignment within normal limits, aside from mild anterolisthesis at T11-T12 which is stable since 2011. Benign upper thoracic vertebral body hemangiomas are suspected including at T3 where there is mild enhancement of the lesion (series 18, image 5). No marrow edema or evidence of acute osseous abnormality.  Negative visualized posterior paraspinal soft  tissues.  There is a small layering left pleural effusion. Dependent pulmonary atelectasis. Negative visualized upper abdominal viscera.  No upper thoracic spinal stenosis, however, there is abnormal upper thoracic spinal cord signal at the T4 level (series 5, image 7 and series 8, image 9). This signal appears to be located centrally and may also involve the dorsal columns. No cord expansion. No associated cord enhancement. The adjacent spinal cord segments appear within normal limits. No significant degenerative changes in this region.  Severe abnormality in the lower thoracic spine extending from the T10 to the T12 level, with a lobulated somewhat multiloculated appearing process occupying the ventral spinal canal at these levels, encompassing 9 x 14 x 55 mm (AP by transverse by CC). The abnormality is heterogeneously hyperintense on T2 imaging, hyperintense on STIR imaging, and nearly isointense to CSF on T1 precontrast imaging. However, the lesion significantly loses signal on axial gradient echo imaging. Following contrast are several curvilinear areas of enhancement, mostly along the periphery of the lesion (series 18, image 6). Precontrast images do not suggest favor this process is extradural, but intradural  location is difficult to exclude.  There is associated cord compression. The cord is posteriorly displaced and compressed, maximally at T11 (series 8, image 32). There does appear to be abnormal associated cord signal.  The most distal spinal cord and conus medullaris are unaffected and appear within normal limits. No intra-axial enhancement.  No abnormality in this region was evident on the 2011 contrast-enhanced CT Abdomen and Pelvis.  MR LUMBAR SPINE FINDINGS  Lumbar segmentation is normal. Chronic L5-S1 anterolisthesis has mildly progressed since 2011. Mild retrolisthesis at L3-L4 has progressed. There are associated degenerative changes at these levels, including advanced disc and endplate degeneration at L4-L5 were there is mild marrow edema. No acute osseous abnormality identified.  There is patchy increased STIR signal in some of the posterior paraspinal soft tissues, nonspecific.  The conus medullaris is normal at L1-L2. The space-occupying mass in the lower thoracic spine appears to terminate at the T12-L1 disc space level. Cauda equina nerve roots are within normal limits, aside from the degenerative spinal stenosis findings described below.  However, there is trace layering hemorrhage at the term in a shin of the thecal sac at the S2-S3 level (series 13, image 6 and series 16, image 33).  Visualized abdominal viscera are stable since 2011.  The following degenerative changes are noted in the lumbar spine.  L2-L3: Lobulated disc bulge and severe facet hypertrophy. Mild spinal and L2 biforaminal stenosis.  L3-L4: Severe facet and ligament flavum hypertrophy with facet joint fluid. Broad-based disc bulge. Moderate spinal stenosis and right L3 foraminal stenosis.  L4-L5: Severe multifactorial spinal stenosis with right eccentric disc bulge and severe facet hypertrophy. Moderate right L4 foraminal stenosis.  L5-S1: Severe facet hypertrophy and degeneration with facet joint fluid. Grade 1 anterolisthesis at  this level. Moderate ligament flavum hypertrophy. Broad-based disc/ pseudo disc. Mild spinal stenosis. Moderate left lateral recess stenosis. Moderate left L5 foraminal stenosis.  Visible sacrum intact.  Diverticulosis of the sigmoid colon.  IMPRESSION: 1. Lower thoracic spinal cord compression with abnormal cord signal related to a lobulated roughly 1 x 1.5 x 5.5 cm space-occupying mass in the ventral spinal canal from the T10 to the T12 level. This most resembles hemorrhage/hematoma. This could be occupying the ventral epidural space (slightly favored) or intradural, and there is trace layering hemorrhage in the caudal thecal sac at S2-S3. Burtis Junes this is posttraumatic in nature rather than related to  a spinal/dural AVM, however, small curvilinear areas of enhancement along the periphery of the hematoma are noted (perhaps due to active bleeding). 2.  No acute osseous abnormality identified. 3. Superimposed T4 level abnormal central and dorsal spinal cord signal, nonspecific. No degenerative changes in the region. Perhaps this is sequelae of prior transverse myelitis or less likely small cord infarct. 4. No acute findings in the lumbar spine. Moderate to severe degenerative spinal stenosis at L3-L4 and L4-L5. Critical Value/emergent results were called by telephone at the time of interpretation on 11/16/2014 at 1836 hrs. to Dr. Jerilynn Mages. Pheiffer in the ED who verbally acknowledged these results.   Electronically Signed   By: Lars Pinks M.D.   On: 11/16/2014 19:13   Mr Thoracic Spine W Wo Contrast  11/16/2014   CLINICAL DATA:  80 year old female with acute onset severe back pain after pole Oddi is class. Pain radiating anteriorly in the thorax, and progressing. Subsequent progressive weakness in her lower extremities, unable to walk. Current history of atrial fibrillation on Coumadin. Initial encounter.  EXAM: MRI THORACIC AND LUMBAR SPINE WITHOUT AND WITH CONTRAST  TECHNIQUE: Multiplanar and multiecho pulse sequences of  the thoracic and lumbar spine were obtained without and with intravenous contrast.  CONTRAST:  60m MULTIHANCE GADOBENATE DIMEGLUMINE 529 MG/ML IV SOLN  COMPARISON:  CT Abdomen and Pelvis without contrast 1024 hr today, and contrast-enhanced CT Abdomen and Pelvis 05/27/2010.  FINDINGS: MR THORACIC SPINE FINDINGS  Limited sagittal imaging the cervical spine remarkable for widespread chronic disc and endplate degeneration.  Thoracic vertebral height and alignment within normal limits, aside from mild anterolisthesis at T11-T12 which is stable since 2011. Benign upper thoracic vertebral body hemangiomas are suspected including at T3 where there is mild enhancement of the lesion (series 18, image 5). No marrow edema or evidence of acute osseous abnormality.  Negative visualized posterior paraspinal soft tissues.  There is a small layering left pleural effusion. Dependent pulmonary atelectasis. Negative visualized upper abdominal viscera.  No upper thoracic spinal stenosis, however, there is abnormal upper thoracic spinal cord signal at the T4 level (series 5, image 7 and series 8, image 9). This signal appears to be located centrally and may also involve the dorsal columns. No cord expansion. No associated cord enhancement. The adjacent spinal cord segments appear within normal limits. No significant degenerative changes in this region.  Severe abnormality in the lower thoracic spine extending from the T10 to the T12 level, with a lobulated somewhat multiloculated appearing process occupying the ventral spinal canal at these levels, encompassing 9 x 14 x 55 mm (AP by transverse by CC). The abnormality is heterogeneously hyperintense on T2 imaging, hyperintense on STIR imaging, and nearly isointense to CSF on T1 precontrast imaging. However, the lesion significantly loses signal on axial gradient echo imaging. Following contrast are several curvilinear areas of enhancement, mostly along the periphery of the lesion  (series 18, image 6). Precontrast images do not suggest favor this process is extradural, but intradural location is difficult to exclude.  There is associated cord compression. The cord is posteriorly displaced and compressed, maximally at T11 (series 8, image 32). There does appear to be abnormal associated cord signal.  The most distal spinal cord and conus medullaris are unaffected and appear within normal limits. No intra-axial enhancement.  No abnormality in this region was evident on the 2011 contrast-enhanced CT Abdomen and Pelvis.  MR LUMBAR SPINE FINDINGS  Lumbar segmentation is normal. Chronic L5-S1 anterolisthesis has mildly progressed since 2011. Mild retrolisthesis at  L3-L4 has progressed. There are associated degenerative changes at these levels, including advanced disc and endplate degeneration at L4-L5 were there is mild marrow edema. No acute osseous abnormality identified.  There is patchy increased STIR signal in some of the posterior paraspinal soft tissues, nonspecific.  The conus medullaris is normal at L1-L2. The space-occupying mass in the lower thoracic spine appears to terminate at the T12-L1 disc space level. Cauda equina nerve roots are within normal limits, aside from the degenerative spinal stenosis findings described below.  However, there is trace layering hemorrhage at the term in a shin of the thecal sac at the S2-S3 level (series 13, image 6 and series 16, image 33).  Visualized abdominal viscera are stable since 2011.  The following degenerative changes are noted in the lumbar spine.  L2-L3: Lobulated disc bulge and severe facet hypertrophy. Mild spinal and L2 biforaminal stenosis.  L3-L4: Severe facet and ligament flavum hypertrophy with facet joint fluid. Broad-based disc bulge. Moderate spinal stenosis and right L3 foraminal stenosis.  L4-L5: Severe multifactorial spinal stenosis with right eccentric disc bulge and severe facet hypertrophy. Moderate right L4 foraminal  stenosis.  L5-S1: Severe facet hypertrophy and degeneration with facet joint fluid. Grade 1 anterolisthesis at this level. Moderate ligament flavum hypertrophy. Broad-based disc/ pseudo disc. Mild spinal stenosis. Moderate left lateral recess stenosis. Moderate left L5 foraminal stenosis.  Visible sacrum intact.  Diverticulosis of the sigmoid colon.  IMPRESSION: 1. Lower thoracic spinal cord compression with abnormal cord signal related to a lobulated roughly 1 x 1.5 x 5.5 cm space-occupying mass in the ventral spinal canal from the T10 to the T12 level. This most resembles hemorrhage/hematoma. This could be occupying the ventral epidural space (slightly favored) or intradural, and there is trace layering hemorrhage in the caudal thecal sac at S2-S3. Burtis Junes this is posttraumatic in nature rather than related to a spinal/dural AVM, however, small curvilinear areas of enhancement along the periphery of the hematoma are noted (perhaps due to active bleeding). 2.  No acute osseous abnormality identified. 3. Superimposed T4 level abnormal central and dorsal spinal cord signal, nonspecific. No degenerative changes in the region. Perhaps this is sequelae of prior transverse myelitis or less likely small cord infarct. 4. No acute findings in the lumbar spine. Moderate to severe degenerative spinal stenosis at L3-L4 and L4-L5. Critical Value/emergent results were called by telephone at the time of interpretation on 11/16/2014 at 1836 hrs. to Dr. Jerilynn Mages. Pheiffer in the ED who verbally acknowledged these results.   Electronically Signed   By: Lars Pinks M.D.   On: 11/16/2014 19:13    Review of Systems  Constitutional: Negative.   HENT: Negative.   Eyes: Negative.   Respiratory: Negative.   Cardiovascular: Positive for palpitations.  Gastrointestinal: Negative.   Genitourinary: Positive for flank pain.  Musculoskeletal: Positive for back pain.  Skin: Negative.   Neurological: Positive for sensory change and focal  weakness.  Endo/Heme/Allergies: Negative.   Psychiatric/Behavioral: Negative.     Blood pressure 152/92, pulse 124, temperature 97.9 F (36.6 C), temperature source Oral, resp. rate 16, height '5\' 6"'  (1.676 m), weight 48.988 kg (108 lb), SpO2 100 %. Physical Exam hent, nl, neck, nl. Cv, nl. Lungs, clear. Abdomen soft. Extremities  Normal pu;ses. NEURO  PARAPLEGIC, SENSORY LEVEL AT T12-L1. NO DTR. NO RECTAL TONE/MRI shows a large epidural hematoma from t10 to l1.  Assessment/Plan Emergency thoracic decompression. Patient and relatives aware of risks , outcome, benefits   TO OR asap  Aronda Burford M  11/16/2014, 8:08 PM

## 2014-11-16 NOTE — ED Notes (Signed)
Per EMS, pt coming from home today for lower back pain that started this morning. Pt denies any injury this morning. Pt was given 150 of fentanyl in route and 4mg  of zofran. Pt reports minimal relief of pain.

## 2014-11-16 NOTE — ED Notes (Signed)
Pt attempted to ambulate, but felt weak when standing and had to sit back down. Pt reports pain is better. Dr. Patria Maneampos notified. He instructed this RN to give the pt gingerale.

## 2014-11-16 NOTE — ED Notes (Addendum)
Neuro surgeon at the bedside. Consent signed.

## 2014-11-16 NOTE — Transfer of Care (Signed)
Immediate Anesthesia Transfer of Care Note  Patient: [name] Weber  Procedure(s) Performed: Procedure(s): THORACIC LAMINECTOMY FOR EPIDURAL ABSCESS (N/A)  Patient Location: PACU  Anesthesia Type:General  Level of Consciousness: awake  Airway & Oxygen Therapy: Patient Spontanous Breathing and Patient connected to nasal cannula oxygen  Post-op Assessment: Report given to PACU RN and Post -op Vital signs reviewed and stable  Post vital signs: Reviewed and stable  Complications: No apparent anesthesia complications

## 2014-11-16 NOTE — ED Notes (Signed)
MD at bedside. 

## 2014-11-16 NOTE — Anesthesia Preprocedure Evaluation (Addendum)
Anesthesia Evaluation  Patient identified by MRN, date of birth, ID band Patient awake    Reviewed: Allergy & Precautions, H&P , NPO status , Patient's Chart, lab work & pertinent test results  Airway        Dental   Pulmonary former smoker,          Cardiovascular hypertension, + dysrhythmias Atrial Fibrillation     Neuro/Psych  Neuromuscular disease    GI/Hepatic   Endo/Other    Renal/GU      Musculoskeletal   Abdominal   Peds  Hematology   Anesthesia Other Findings   Reproductive/Obstetrics                           Anesthesia Physical Anesthesia Plan  ASA: III and emergent  Anesthesia Plan: General   Post-op Pain Management:    Induction: Intravenous  Airway Management Planned: Oral ETT  Additional Equipment:   Intra-op Plan:   Post-operative Plan: Extubation in OR  Informed Consent: I have reviewed the patients History and Physical, chart, labs and discussed the procedure including the risks, benefits and alternatives for the proposed anesthesia with the patient or authorized representative who has indicated his/her understanding and acceptance.   Dental advisory given  Plan Discussed with: CRNA, Anesthesiologist and Surgeon  Anesthesia Plan Comments:         Anesthesia Quick Evaluation

## 2014-11-16 NOTE — ED Provider Notes (Signed)
CSN: [id number]     Arrival date & time 11/16/14  [id number] History   First MD Initiated Contact with Patient 11/16/14 [phone]     Chief Complaint  Patient presents with  . Back Pain      HPI Patient reports she developed rather abrupt onset low back pain with new pain and weakness radiating down into her thighs and legs.  This began this morning.  She's never had significant back problems before.  She reports severe pain in her right flank.  There is some radiation of this pain towards her abdomen.  No prior history kidney stones.  She felt nauseated on arrival to emergency department.  His had no vomiting.  Denies fevers or chills.  No history of cancer.  No history of IV drug abuse.  Pain seems to be focused to her right lower back.   Past Medical History  Diagnosis Date  . Paroxysmal atrial flutter   . Palpitations     OCCASSIONAL  . PAC (premature atrial contraction)     ISOLATED  . PVC's (premature ventricular contractions)     ISOLATED  . Malaise   . Fatigue   . Myalgia    Past Surgical History  Procedure Laterality Date  . Vulvar lesion removal  01/01/2009    She had a 1-cm area of erythema to the right of the urethral meatus   Family History  Problem Relation Age of Onset  . Hyperlipidemia Mother    History  Substance Use Topics  . Smoking status: Former Smoker    Quit date: 03/09/1991  . Smokeless tobacco: Not on file  . Alcohol Use: No   OB History    No data available     Review of Systems  All other systems reviewed and are negative.     Allergies  Crestor  Home Medications   Prior to Admission medications   Medication Sig Start Date End Date Taking? Authorizing Provider  alendronate (FOSAMAX) 35 MG tablet Take 35 mg by mouth every 7 (seven) days. Take with a full glass of water on an empty stomach.    Historical Provider, MD  amoxicillin (AMOXIL) 500 MG capsule as needed.  07/03/13   Historical Provider, MD  atorvastatin (LIPITOR) 10 MG tablet Take 10  mg by mouth daily.    Historical Provider, MD  Calcium Carbonate-Vitamin D (CALCIUM + D PO) Take by mouth 2 (two) times daily.      Historical Provider, MD  Coenzyme Q10 (COQ10 PO) Take by mouth daily.    Historical Provider, MD  metoprolol succinate (TOPROL XL) 25 MG 24 hr tablet Take 0.5 tablets (12.5 mg total) by mouth daily. 12/04/13 12/04/14  Cassell Clementhomas Brackbill, MD  Omega-3 Fatty Acids (FISH OIL PO) Take 2,000 mg by mouth 2 (two) times daily.      Historical Provider, MD  predniSONE (DELTASONE) 20 MG tablet Take 2 tablets (40 mg total) by mouth daily. 11/13/14   Elvina SidleKurt Lauenstein, MD  ramipril (ALTACE) 2.5 MG capsule Take 1 capsule (2.5 mg total) by mouth daily. 12/04/13   Cassell Clementhomas Brackbill, MD  warfarin (COUMADIN) 5 MG tablet Take as directed by coumadin clinic 01/15/14   Cassell Clementhomas Brackbill, MD   BP 163/100 mmHg  Pulse 79  Resp 11  Ht 5\' 6"  (1.676 m)  Wt 108 lb (48.988 kg)  BMI 17.44 kg/m2  SpO2 100% Physical Exam  Constitutional: She is oriented to person, place, and time. She appears well-developed and well-nourished. No distress.  HENT:  Head:  Normocephalic and atraumatic.  Eyes: EOM are normal.  Neck: Normal range of motion.  Cardiovascular: Normal rate, regular rhythm and normal heart sounds.   Pulmonary/Chest: Effort normal and breath sounds normal.  Abdominal: Soft. She exhibits no distension. There is no tenderness.  Musculoskeletal: Normal range of motion.  No thoracic or lumbar tenderness but does have paralumbar tenderness with possible spasm on the right.  No rash noted.  No tenderness across her coccyx or sacrum.  Full range of motion bilateral ankles knees and hips.  Normal isolated strength of her major muscle groups of her lower extremities however when the patient attempts to stand she develops weakness in both of his legs and cannot bear weight.  Neurological: She is alert and oriented to person, place, and time.  Skin: Skin is warm and dry.  Psychiatric: She has a normal mood  and affect. Judgment normal.  Nursing note and vitals reviewed.   ED Course  Procedures (including critical care time) Labs Review Labs Reviewed  CBC - Abnormal; Notable for the following:    WBC 10.6 (*)    All other components within normal limits  COMPREHENSIVE METABOLIC PANEL - Abnormal; Notable for the following:    Glucose, Bld 162 (*)    AST 41 (*)    GFR calc non Af Amer 83 (*)    All other components within normal limits  URINALYSIS, ROUTINE W REFLEX MICROSCOPIC - Abnormal; Notable for the following:    Glucose, UA 100 (*)    Hgb urine dipstick SMALL (*)    All other components within normal limits  PROTIME-INR - Abnormal; Notable for the following:    Prothrombin Time 32.6 (*)    INR 3.16 (*)    All other components within normal limits  URINE MICROSCOPIC-ADD ON    Imaging Review Ct Abdomen Pelvis Wo Contrast  11/16/2014   CLINICAL DATA:  Right flank pain for 1 day with nausea and vomiting. Initial encounter.  EXAM: CT ABDOMEN AND PELVIS WITHOUT CONTRAST  TECHNIQUE: Multidetector CT imaging of the abdomen and pelvis was performed following the standard protocol without IV contrast.  COMPARISON:  05/27/2010 abdominal pelvic CT.  FINDINGS: Lower chest: Clear lung bases. No significant pleural or pericardial effusion.  Hepatobiliary: As evaluated in the noncontrast state, the liver appears normal without focal abnormality. No evidence of gallstones, gallbladder wall thickening or biliary dilatation.  Pancreas: Unremarkable. No pancreatic ductal dilatation or surrounding inflammatory changes.  Spleen: Normal in size without focal abnormality.  Adrenals/Urinary Tract: Both adrenal glands appear normal.There is no evidence of urinary tract calculus or hydronephrosis. This is small parenchymal calcification in the upper pole of the left kidney. A simple cyst posteriorly in the left kidney appears stable. The distal ureters are not well visualized, but no ureteral or bladder calculi  are seen.  Stomach/Bowel: No evidence of bowel wall thickening, distention or surrounding inflammatory change.Mild distal colonic diverticular changes are present.  Vascular/Lymphatic: There are no enlarged abdominal or pelvic lymph nodes. There is diffuse aortoiliac atherosclerosis.  Reproductive: Status post hysterectomy. No evidence of adnexal mass.  Other: No evidence of abdominal wall mass or hernia.  Musculoskeletal: No acute or significant osseous findings. There are degenerative changes throughout the lumbar spine associated with a convex left scoliosis.  IMPRESSION: IMPRESSION 1. No evidence of urinary tract calculus or hydronephrosis. 2. No acute findings identified. 3. Diffuse atherosclerosis and mild distal colonic diverticulosis noted.   Electronically Signed   By: Roxy HorsemanBill  Veazey M.D.   On:  11/16/2014 10:43  I personally reviewed the imaging tests through PACS system I reviewed available ER/hospitalization records through the EMR    EKG Interpretation None      MDM   Final diagnoses:  Right flank pain  Nausea & vomiting    Initially a CT scan was performed given the acuity in onset of her right flank pain.  No ureteral stones are noted.  No obvious osseous abnormalities.  No obvious abnormalities around her hips.  Her pain was treated and seemed to be improving in the emergency department.  However since that time I tried to ambulate her on several occasions and she cannot bear weight and cannot walk.  She still can move her legs in isolation at this time.  She has normal pulses in her feet.  Abdominal exam remains benign.  His a very functional 80 year old female who does Pilates.  She cannot go home safely if she cannot ambulate.  She will undergo thoracic and lumbar MRI imaging at this time.  If this is normal she is able to ambulate she can be discharged home safely with primary care follow-up.  If she is unable to ambulate she'll need to be admitted the hospital for pain control and  physical therapy.  She lives at home by herself.  She has friends with her in the room at this time. Care to Dr Donnald Garre to follow up on MRI    Lyanne Co, MD 11/16/14 [phone]

## 2014-11-16 NOTE — ED Notes (Signed)
Requested KCENTRA and Vitamin K from Pharmacy.

## 2014-11-16 NOTE — ED Notes (Signed)
Pt in MRI.

## 2014-11-16 NOTE — Consult Note (Signed)
CARDIOLOGY CONSULT NOTE   Patient ID: [name] MRN: [id number] DOB/AGE: 78/09/1931 78 y.o.  Admit date: 11/16/2014  Primary Physician   [name] Inch, MD Primary Cardiologist   Dr. Patty Weber  Reason for Consultation   Coumadin reversal for spinal hematoma compressing spinal chord   HPI: [name] Weber is a 80 y.o. female with a history of PAF on coumadin, HLD and no other real significant medical history who presented to [facility] today with abrupt onset of new low back pain and weakness and found to have a spinal hemorrhage/hematoma in the ventral spinal canal causing spinal chord compression. Her coumadin is being emergently reversed for surgical decompression and cardiology was consulted.   The patient presented with abrupt onset low back pain with new pain and weakness radiating down into her thighs and legs, which began this morning.She also had severe pain in her right flank and abdomen with some associated nausea.  Initially a CT scan was performed in the ED with no obvious abnormality. However, the patient could not walk and was not improving so a thoracic and lumbar MRI was ordered which revealed  a spinal hemorrhage/ hematoma in the ventral spinal canal causing spinal chord compression. The patient's case has been reviewed with neurosurgery, Dr. [name]. The patient needs emergent Warfarin reversal for surgical decompression. Rapid reversal protocol initiated. She is currently in the OR so I could not see the patient.   Past Medical History  Diagnosis Date  . Paroxysmal atrial flutter   . Palpitations     OCCASSIONAL  . PAC (premature atrial contraction)     ISOLATED  . PVC's (premature ventricular contractions)     ISOLATED  . Malaise   . Fatigue   . Myalgia      Past Surgical History  Procedure Laterality Date  . Vulvar lesion removal  01/01/2009    She had a 1-cm area of erythema to the right of the urethral meatus    Allergies  Allergen Reactions   . Crestor [Rosuvastatin Calcium]     I have reviewed the patient's current medications . prothrombin complex conc human (Kcentra) IVPB  1,569 Units Intravenous STAT   . phytonadione (VITAMIN K) IV 10 mg (11/16/14 1933)     Prior to Admission medications   Medication Sig Start Date End Date Taking? Authorizing Provider  alendronate (FOSAMAX) 35 MG tablet Take 35 mg by mouth every 7 (seven) days. Take with a full glass of water on an empty stomach. Sunday   Yes Historical Provider, MD  atorvastatin (LIPITOR) 10 MG tablet Take 10 mg by mouth daily.   Yes Historical Provider, MD  Calcium Carbonate-Vitamin D (CALCIUM + D PO) Take by mouth 2 (two) times daily.     Yes Historical Provider, MD  Coenzyme Q10 (COQ10 PO) Take by mouth daily.   Yes Historical Provider, MD  metoprolol succinate (TOPROL XL) 25 MG 24 hr tablet Take 0.5 tablets (12.5 mg total) by mouth daily. 12/04/13 12/04/14 Yes Cassell Clement, MD  Omega-3 Fatty Acids (FISH OIL PO) Take 2,000 mg by mouth 2 (two) times daily.     Yes Historical Provider, MD  predniSONE (DELTASONE) 20 MG tablet Take 2 tablets (40 mg total) by mouth daily. 11/13/14  Yes Elvina Sidle, MD  ramipril (ALTACE) 2.5 MG capsule Take 1 capsule (2.5 mg total) by mouth daily. 12/04/13  Yes Cassell Clement, MD  warfarin (COUMADIN) 5 MG tablet Take as directed by coumadin clinic 01/15/14  Yes Maisie Fus  Brackbill, MD  amoxicillin (AMOXIL) 500 MG capsule as needed. Prior to dental visit 07/03/13   Historical Provider, MD  HYDROcodone-acetaminophen (NORCO/VICODIN) 5-325 MG per tablet Take 1 tablet by mouth every 4 (four) hours as needed for moderate pain. 11/16/14   Lyanne Co, MD  ondansetron (ZOFRAN ODT) 8 MG disintegrating tablet Take 1 tablet (8 mg total) by mouth every 8 (eight) hours as needed for nausea or vomiting. 11/16/14   Lyanne Co, MD     History   Social History  . Marital Status: Divorced    Spouse Name: N/A    Number of Children: N/A  . Years of  Education: N/A   Occupational History  . Not on file.   Social History Main Topics  . Smoking status: Former Smoker    Quit date: 03/09/1991  . Smokeless tobacco: Not on file  . Alcohol Use: No  . Drug Use: No  . Sexual Activity: Not on file   Other Topics Concern  . Not on file   Social History Narrative    Family Status  Relation Status Death Age  . Mother Deceased     93-pancreatic cancer  . Father Deceased 30's    burned in house fire  . Brother Deceased     pancreatic cancer  . Brother Alive     healthy   Family History  Problem Relation Age of Onset  . Hyperlipidemia Mother      ROS:  Full 14 point review of systems complete and found to be negative unless listed above.  Physical Exam: Blood pressure 152/92, pulse 124, temperature 97.9 F (36.6 C), temperature source Oral, resp. rate 16, height 5\' 6"  (1.676 m), weight 108 lb (48.988 kg), SpO2 100 %.  General: Well developed, well nourished, female in no acute distress Head: Eyes PERRLA, No xanthomas.   Normocephalic and atraumatic, oropharynx without edema or exudate. Dentition:  Lungs:  Heart: HRRR S1 S2, no rub/gallop, Heart irregular rate and rhythm with S1, S2  murmur. pulses are 2+ extrem.   Neck: No carotid bruits. No lymphadenopathy.  JVD. Abdomen: Bowel sounds present, abdomen soft and non-tender without masses or hernias noted. Msk:  No spine or cva tenderness. No weakness, no joint deformities or effusions. Extremities: No clubbing or cyanosis.  edema.  Neuro: Alert and oriented X 3. No focal deficits noted. Psych:  Good affect, responds appropriately Skin: No rashes or lesions noted.  Labs:   Lab Results  Component Value Date   WBC 10.6* 11/16/2014   HGB 13.7 11/16/2014   HCT 42.7 11/16/2014   MCV 99.5 11/16/2014   PLT 156 11/16/2014    Recent Labs  11/16/14 1000  INR 3.16*     Recent Labs Lab 11/16/14 1000  NA 142  K 4.4  CL 101  CO2 28  BUN 17  CREATININE 0.60  CALCIUM  9.4  PROT 7.0  BILITOT 0.6  ALKPHOS 56  ALT 33  AST 41*  GLUCOSE 162*  ALBUMIN 3.6   No results found for: MG No results for input(s): CKTOTAL, CKMB, TROPONINI in the last 72 hours. No results for input(s): TROPIPOC in the last 72 hours. No results found for: PROBNP Lab Results  Component Value Date   CHOL 230* 02/18/2012   HDL 87.90 02/18/2012   TRIG 50.0 02/18/2012   No results found for: DDIMER No results found for: LIPASE, AMYLASE TSH  Date/Time Value Ref Range Status  06/01/2011 11:20 AM 1.69 0.35 - 5.50 uIU/mL Final  No results found for: VITAMINB12, FOLATE, FERRITIN, TIBC, IRON, RETICCTPCT  Echo:   ECG:  Aflutter with 2:1 block  Radiology:  Ct Abdomen Pelvis Wo Contrast  11/16/2014   CLINICAL DATA:  Right flank pain for 1 day with nausea and vomiting. Initial encounter.  EXAM: CT ABDOMEN AND PELVIS WITHOUT CONTRAST  TECHNIQUE: Multidetector CT imaging of the abdomen and pelvis was performed following the standard protocol without IV contrast.  COMPARISON:  05/27/2010 abdominal pelvic CT.  FINDINGS: Lower chest: Clear lung bases. No significant pleural or pericardial effusion.  Hepatobiliary: As evaluated in the noncontrast state, the liver appears normal without focal abnormality. No evidence of gallstones, gallbladder wall thickening or biliary dilatation.  Pancreas: Unremarkable. No pancreatic ductal dilatation or surrounding inflammatory changes.  Spleen: Normal in size without focal abnormality.  Adrenals/Urinary Tract: Both adrenal glands appear normal.There is no evidence of urinary tract calculus or hydronephrosis. This is small parenchymal calcification in the upper pole of the left kidney. A simple cyst posteriorly in the left kidney appears stable. The distal ureters are not well visualized, but no ureteral or bladder calculi are seen.  Stomach/Bowel: No evidence of bowel wall thickening, distention or surrounding inflammatory change.Mild distal colonic diverticular  changes are present.  Vascular/Lymphatic: There are no enlarged abdominal or pelvic lymph nodes. There is diffuse aortoiliac atherosclerosis.  Reproductive: Status post hysterectomy. No evidence of adnexal mass.  Other: No evidence of abdominal wall mass or hernia.  Musculoskeletal: No acute or significant osseous findings. There are degenerative changes throughout the lumbar spine associated with a convex left scoliosis.  IMPRESSION: IMPRESSION 1. No evidence of urinary tract calculus or hydronephrosis. 2. No acute findings identified. 3. Diffuse atherosclerosis and mild distal colonic diverticulosis noted.   Electronically Signed   By: Roxy Horseman M.D.   On: 11/16/2014 10:43   Mr Lumbar Spine Wo Contrast  11/16/2014   CLINICAL DATA:  80 year old female with acute onset severe back pain after pole Oddi is class. Pain radiating anteriorly in the thorax, and progressing. Subsequent progressive weakness in her lower extremities, unable to walk. Current history of atrial fibrillation on Coumadin. Initial encounter.  EXAM: MRI THORACIC AND LUMBAR SPINE WITHOUT AND WITH CONTRAST  TECHNIQUE: Multiplanar and multiecho pulse sequences of the thoracic and lumbar spine were obtained without and with intravenous contrast.  CONTRAST:  10mL MULTIHANCE GADOBENATE DIMEGLUMINE 529 MG/ML IV SOLN  COMPARISON:  CT Abdomen and Pelvis without contrast 1024 hr today, and contrast-enhanced CT Abdomen and Pelvis 05/27/2010.  FINDINGS: MR THORACIC SPINE FINDINGS  Limited sagittal imaging the cervical spine remarkable for widespread chronic disc and endplate degeneration.  Thoracic vertebral height and alignment within normal limits, aside from mild anterolisthesis at T11-T12 which is stable since 2011. Benign upper thoracic vertebral body hemangiomas are suspected including at T3 where there is mild enhancement of the lesion (series 18, image 5). No marrow edema or evidence of acute osseous abnormality.  Negative visualized posterior  paraspinal soft tissues.  There is a small layering left pleural effusion. Dependent pulmonary atelectasis. Negative visualized upper abdominal viscera.  No upper thoracic spinal stenosis, however, there is abnormal upper thoracic spinal cord signal at the T4 level (series 5, image 7 and series 8, image 9). This signal appears to be located centrally and may also involve the dorsal columns. No cord expansion. No associated cord enhancement. The adjacent spinal cord segments appear within normal limits. No significant degenerative changes in this region.  Severe abnormality in the lower thoracic spine extending  from the T10 to the T12 level, with a lobulated somewhat multiloculated appearing process occupying the ventral spinal canal at these levels, encompassing 9 x 14 x 55 mm (AP by transverse by CC). The abnormality is heterogeneously hyperintense on T2 imaging, hyperintense on STIR imaging, and nearly isointense to CSF on T1 precontrast imaging. However, the lesion significantly loses signal on axial gradient echo imaging. Following contrast are several curvilinear areas of enhancement, mostly along the periphery of the lesion (series 18, image 6). Precontrast images do not suggest favor this process is extradural, but intradural location is difficult to exclude.  There is associated cord compression. The cord is posteriorly displaced and compressed, maximally at T11 (series 8, image 32). There does appear to be abnormal associated cord signal.  The most distal spinal cord and conus medullaris are unaffected and appear within normal limits. No intra-axial enhancement.  No abnormality in this region was evident on the 2011 contrast-enhanced CT Abdomen and Pelvis.  MR LUMBAR SPINE FINDINGS  Lumbar segmentation is normal. Chronic L5-S1 anterolisthesis has mildly progressed since 2011. Mild retrolisthesis at L3-L4 has progressed. There are associated degenerative changes at these levels, including advanced disc and  endplate degeneration at L4-L5 were there is mild marrow edema. No acute osseous abnormality identified.  There is patchy increased STIR signal in some of the posterior paraspinal soft tissues, nonspecific.  The conus medullaris is normal at L1-L2. The space-occupying mass in the lower thoracic spine appears to terminate at the T12-L1 disc space level. Cauda equina nerve roots are within normal limits, aside from the degenerative spinal stenosis findings described below.  However, there is trace layering hemorrhage at the term in a shin of the thecal sac at the S2-S3 level (series 13, image 6 and series 16, image 33).  Visualized abdominal viscera are stable since 2011.  The following degenerative changes are noted in the lumbar spine.  L2-L3: Lobulated disc bulge and severe facet hypertrophy. Mild spinal and L2 biforaminal stenosis.  L3-L4: Severe facet and ligament flavum hypertrophy with facet joint fluid. Broad-based disc bulge. Moderate spinal stenosis and right L3 foraminal stenosis.  L4-L5: Severe multifactorial spinal stenosis with right eccentric disc bulge and severe facet hypertrophy. Moderate right L4 foraminal stenosis.  L5-S1: Severe facet hypertrophy and degeneration with facet joint fluid. Grade 1 anterolisthesis at this level. Moderate ligament flavum hypertrophy. Broad-based disc/ pseudo disc. Mild spinal stenosis. Moderate left lateral recess stenosis. Moderate left L5 foraminal stenosis.  Visible sacrum intact.  Diverticulosis of the sigmoid colon.  IMPRESSION: 1. Lower thoracic spinal cord compression with abnormal cord signal related to a lobulated roughly 1 x 1.5 x 5.5 cm space-occupying mass in the ventral spinal canal from the T10 to the T12 level. This most resembles hemorrhage/hematoma. This could be occupying the ventral epidural space (slightly favored) or intradural, and there is trace layering hemorrhage in the caudal thecal sac at S2-S3. Legrand RamsFavor this is posttraumatic in nature rather  than related to a spinal/dural AVM, however, small curvilinear areas of enhancement along the periphery of the hematoma are noted (perhaps due to active bleeding). 2.  No acute osseous abnormality identified. 3. Superimposed T4 level abnormal central and dorsal spinal cord signal, nonspecific. No degenerative changes in the region. Perhaps this is sequelae of prior transverse myelitis or less likely small cord infarct. 4. No acute findings in the lumbar spine. Moderate to severe degenerative spinal stenosis at L3-L4 and L4-L5. Critical Value/emergent results were called by telephone at the time of interpretation on  11/16/2014 at 1836 hrs. to Dr. Judie PetitM. Pheiffer in the ED who verbally acknowledged these results.   Electronically Signed   By: Augusto GambleLee  Hall M.D.   On: 11/16/2014 19:13   Mr Thoracic Spine W Wo Contrast  11/16/2014   CLINICAL DATA:  80 year old female with acute onset severe back pain after pole Oddi is class. Pain radiating anteriorly in the thorax, and progressing. Subsequent progressive weakness in her lower extremities, unable to walk. Current history of atrial fibrillation on Coumadin. Initial encounter.  EXAM: MRI THORACIC AND LUMBAR SPINE WITHOUT AND WITH CONTRAST  TECHNIQUE: Multiplanar and multiecho pulse sequences of the thoracic and lumbar spine were obtained without and with intravenous contrast.  CONTRAST:  10mL MULTIHANCE GADOBENATE DIMEGLUMINE 529 MG/ML IV SOLN  COMPARISON:  CT Abdomen and Pelvis without contrast 1024 hr today, and contrast-enhanced CT Abdomen and Pelvis 05/27/2010.  FINDINGS: MR THORACIC SPINE FINDINGS  Limited sagittal imaging the cervical spine remarkable for widespread chronic disc and endplate degeneration.  Thoracic vertebral height and alignment within normal limits, aside from mild anterolisthesis at T11-T12 which is stable since 2011. Benign upper thoracic vertebral body hemangiomas are suspected including at T3 where there is mild enhancement of the lesion (series  18, image 5). No marrow edema or evidence of acute osseous abnormality.  Negative visualized posterior paraspinal soft tissues.  There is a small layering left pleural effusion. Dependent pulmonary atelectasis. Negative visualized upper abdominal viscera.  No upper thoracic spinal stenosis, however, there is abnormal upper thoracic spinal cord signal at the T4 level (series 5, image 7 and series 8, image 9). This signal appears to be located centrally and may also involve the dorsal columns. No cord expansion. No associated cord enhancement. The adjacent spinal cord segments appear within normal limits. No significant degenerative changes in this region.  Severe abnormality in the lower thoracic spine extending from the T10 to the T12 level, with a lobulated somewhat multiloculated appearing process occupying the ventral spinal canal at these levels, encompassing 9 x 14 x 55 mm (AP by transverse by CC). The abnormality is heterogeneously hyperintense on T2 imaging, hyperintense on STIR imaging, and nearly isointense to CSF on T1 precontrast imaging. However, the lesion significantly loses signal on axial gradient echo imaging. Following contrast are several curvilinear areas of enhancement, mostly along the periphery of the lesion (series 18, image 6). Precontrast images do not suggest favor this process is extradural, but intradural location is difficult to exclude.  There is associated cord compression. The cord is posteriorly displaced and compressed, maximally at T11 (series 8, image 32). There does appear to be abnormal associated cord signal.  The most distal spinal cord and conus medullaris are unaffected and appear within normal limits. No intra-axial enhancement.  No abnormality in this region was evident on the 2011 contrast-enhanced CT Abdomen and Pelvis.  MR LUMBAR SPINE FINDINGS  Lumbar segmentation is normal. Chronic L5-S1 anterolisthesis has mildly progressed since 2011. Mild retrolisthesis at L3-L4  has progressed. There are associated degenerative changes at these levels, including advanced disc and endplate degeneration at L4-L5 were there is mild marrow edema. No acute osseous abnormality identified.  There is patchy increased STIR signal in some of the posterior paraspinal soft tissues, nonspecific.  The conus medullaris is normal at L1-L2. The space-occupying mass in the lower thoracic spine appears to terminate at the T12-L1 disc space level. Cauda equina nerve roots are within normal limits, aside from the degenerative spinal stenosis findings described below.  However, there is  trace layering hemorrhage at the term in a shin of the thecal sac at the S2-S3 level (series 13, image 6 and series 16, image 33).  Visualized abdominal viscera are stable since 2011.  The following degenerative changes are noted in the lumbar spine.  L2-L3: Lobulated disc bulge and severe facet hypertrophy. Mild spinal and L2 biforaminal stenosis.  L3-L4: Severe facet and ligament flavum hypertrophy with facet joint fluid. Broad-based disc bulge. Moderate spinal stenosis and right L3 foraminal stenosis.  L4-L5: Severe multifactorial spinal stenosis with right eccentric disc bulge and severe facet hypertrophy. Moderate right L4 foraminal stenosis.  L5-S1: Severe facet hypertrophy and degeneration with facet joint fluid. Grade 1 anterolisthesis at this level. Moderate ligament flavum hypertrophy. Broad-based disc/ pseudo disc. Mild spinal stenosis. Moderate left lateral recess stenosis. Moderate left L5 foraminal stenosis.  Visible sacrum intact.  Diverticulosis of the sigmoid colon.  IMPRESSION: 1. Lower thoracic spinal cord compression with abnormal cord signal related to a lobulated roughly 1 x 1.5 x 5.5 cm space-occupying mass in the ventral spinal canal from the T10 to the T12 level. This most resembles hemorrhage/hematoma. This could be occupying the ventral epidural space (slightly favored) or intradural, and there is trace  layering hemorrhage in the caudal thecal sac at S2-S3. Legrand Rams this is posttraumatic in nature rather than related to a spinal/dural AVM, however, small curvilinear areas of enhancement along the periphery of the hematoma are noted (perhaps due to active bleeding). 2.  No acute osseous abnormality identified. 3. Superimposed T4 level abnormal central and dorsal spinal cord signal, nonspecific. No degenerative changes in the region. Perhaps this is sequelae of prior transverse myelitis or less likely small cord infarct. 4. No acute findings in the lumbar spine. Moderate to severe degenerative spinal stenosis at L3-L4 and L4-L5. Critical Value/emergent results were called by telephone at the time of interpretation on 11/16/2014 at 1836 hrs. to Dr. Judie Petit. Pheiffer in the ED who verbally acknowledged these results.   Electronically Signed   By: Augusto Gamble M.D.   On: 11/16/2014 19:13    ASSESSMENT AND PLAN:    Active Problems:   Paroxysmal atrial flutter   Hypercholesterolemia   Signed: Allena Katz 11/16/2014 7:39 PM  Pager [phone]  I have examined the patient and reviewed assessment and plan and discussed with patient.  Agree with above as stated.  Patient with spinal cord hematoma in the setting of anticoagulation for stroke prevention for AFib.  Will discuss with neurosurgery.  Given this complication occuring with an INR of only 3.1, she may not be a candidate for long term anticoagulation in the future.  Borderline rate control-improved.  No pain.  Will follow.  VARANASI,JAYADEEP S.

## 2014-11-16 NOTE — Progress Notes (Signed)
@  2303 from Neuro OR

## 2014-11-16 NOTE — ED Provider Notes (Signed)
Patient was turned over by Dr. Patria Maneampos for follow-up on MRI results. Radiologist, Dr. Margo AyeHall, has reported that the patient has cord compression due to spinal hematoma. The patient's case has been reviewed with neurosurgery, Dr. Jeral FruitBotero. The patient needs emergent Warfarin reversal for surgical decompression. Rapid reversal protocol initiated. Consult has been places for cardiology regarding atrial fibrillation and Warfarin reversal. Patient and family are aware of stroke risk as well as diagnosis of cord compression.  [name] BarretteMarcy Layman Gully, MD 11/16/14 1901

## 2014-11-16 NOTE — ED Notes (Signed)
Consent signed and placed with paper chart. Pt is alert.

## 2014-11-16 NOTE — Anesthesia Postprocedure Evaluation (Signed)
  Anesthesia Post-op Note  Patient: [name] Weber  Procedure(s) Performed: Procedure(s): THORACIC LAMINECTOMY FOR EPIDURAL ABSCESS (N/A)  Patient Location: PACU  Anesthesia Type:General  Level of Consciousness: awake, alert , oriented and patient cooperative  Airway and Oxygen Therapy: Patient Spontanous Breathing  Post-op Pain: mild  Post-op Assessment: Post-op Vital signs reviewed, Patient's Cardiovascular Status Stable, Respiratory Function Stable, Patent Airway, No signs of Nausea or vomiting and Pain level controlled  Post-op Vital Signs: stable  Last Vitals:  Filed Vitals:   11/16/14 2303  BP: 137/86  Pulse: 111  Temp: 36.8 C  Resp: 14    Complications: No apparent anesthesia complications

## 2014-11-16 NOTE — ED Notes (Signed)
Dr Patria Maneampos at bedside, This RN and Dr Patria Maneampos attempted to ambulate pt. Pt unable to bear weight on her own. Pt helped back to bed. Dr. Patria Maneampos to order MRI of lumbar and thoracic spine. NAD at this time

## 2014-11-16 NOTE — ED Notes (Signed)
Pt placed on 2L [place] after SpO2 dropped to 84%, SpO2 moved up to 96%.

## 2014-11-16 NOTE — ED Notes (Signed)
Called pharmacy for vitamin K and kcentra to be delivered.

## 2014-11-16 NOTE — Discharge Instructions (Signed)

## 2014-11-16 NOTE — ED Notes (Signed)
Dr. Suella BroadBoterra is at the bedside.

## 2014-11-16 NOTE — Anesthesia Procedure Notes (Addendum)
Procedure Name: Intubation Date/Time: 11/16/2014 8:21 PM Performed by: Wray KearnsFOLEY, Lela Gell A Pre-anesthesia Checklist: Patient identified, Timeout performed, Emergency Drugs available, Suction available and Patient being monitored Patient Re-evaluated:Patient Re-evaluated prior to inductionOxygen Delivery Method: Circle system utilized Preoxygenation: Pre-oxygenation with 100% oxygen Intubation Type: IV induction and Cricoid Pressure applied Ventilation: Mask ventilation without difficulty Laryngoscope Size: Mac and 3 Grade View: Grade I Tube type: Oral Tube size: 7.0 mm Number of attempts: 1 Airway Equipment and Method: Stylet Placement Confirmation: ETT inserted through vocal cords under direct vision,  breath sounds checked- equal and bilateral and positive ETCO2 Secured at: 21 cm Tube secured with: Tape Dental Injury: Teeth and Oropharynx as per pre-operative assessment

## 2014-11-17 DIAGNOSIS — M4714 Other spondylosis with myelopathy, thoracic region: Secondary | ICD-10-CM | POA: Diagnosis not present

## 2014-11-17 DIAGNOSIS — Z7901 Long term (current) use of anticoagulants: Secondary | ICD-10-CM | POA: Diagnosis not present

## 2014-11-17 DIAGNOSIS — N319 Neuromuscular dysfunction of bladder, unspecified: Secondary | ICD-10-CM | POA: Diagnosis not present

## 2014-11-17 DIAGNOSIS — I481 Persistent atrial fibrillation: Secondary | ICD-10-CM | POA: Diagnosis present

## 2014-11-17 DIAGNOSIS — E785 Hyperlipidemia, unspecified: Secondary | ICD-10-CM | POA: Diagnosis present

## 2014-11-17 DIAGNOSIS — Z7952 Long term (current) use of systemic steroids: Secondary | ICD-10-CM | POA: Diagnosis not present

## 2014-11-17 DIAGNOSIS — I2699 Other pulmonary embolism without acute cor pulmonale: Secondary | ICD-10-CM | POA: Diagnosis not present

## 2014-11-17 DIAGNOSIS — Z79899 Other long term (current) drug therapy: Secondary | ICD-10-CM | POA: Diagnosis not present

## 2014-11-17 DIAGNOSIS — I951 Orthostatic hypotension: Secondary | ICD-10-CM | POA: Diagnosis present

## 2014-11-17 DIAGNOSIS — M549 Dorsalgia, unspecified: Secondary | ICD-10-CM | POA: Diagnosis present

## 2014-11-17 DIAGNOSIS — S065X0A Traumatic subdural hemorrhage without loss of consciousness, initial encounter: Secondary | ICD-10-CM | POA: Diagnosis present

## 2014-11-17 DIAGNOSIS — Z888 Allergy status to other drugs, medicaments and biological substances status: Secondary | ICD-10-CM | POA: Diagnosis not present

## 2014-11-17 DIAGNOSIS — K592 Neurogenic bowel, not elsewhere classified: Secondary | ICD-10-CM | POA: Diagnosis not present

## 2014-11-17 DIAGNOSIS — G8220 Paraplegia, unspecified: Secondary | ICD-10-CM | POA: Diagnosis present

## 2014-11-17 DIAGNOSIS — J90 Pleural effusion, not elsewhere classified: Secondary | ICD-10-CM | POA: Diagnosis not present

## 2014-11-17 DIAGNOSIS — R32 Unspecified urinary incontinence: Secondary | ICD-10-CM | POA: Diagnosis present

## 2014-11-17 DIAGNOSIS — Z87891 Personal history of nicotine dependence: Secondary | ICD-10-CM | POA: Diagnosis not present

## 2014-11-17 DIAGNOSIS — I4892 Unspecified atrial flutter: Secondary | ICD-10-CM | POA: Diagnosis present

## 2014-11-17 DIAGNOSIS — R29898 Other symptoms and signs involving the musculoskeletal system: Secondary | ICD-10-CM | POA: Diagnosis present

## 2014-11-17 DIAGNOSIS — E780 Pure hypercholesterolemia: Secondary | ICD-10-CM | POA: Diagnosis present

## 2014-11-17 DIAGNOSIS — I471 Supraventricular tachycardia: Secondary | ICD-10-CM | POA: Diagnosis present

## 2014-11-17 DIAGNOSIS — Z7983 Long term (current) use of bisphosphonates: Secondary | ICD-10-CM | POA: Diagnosis not present

## 2014-11-17 DIAGNOSIS — I82419 Acute embolism and thrombosis of unspecified femoral vein: Secondary | ICD-10-CM | POA: Diagnosis not present

## 2014-11-17 LAB — PROTIME-INR
INR: 1.01 (ref 0.00–1.49)
INR: 1.07 (ref 0.00–1.49)
INR: 1.17 (ref 0.00–1.49)
INR: 1.24 (ref 0.00–1.49)
Prothrombin Time: 13.4 seconds (ref 11.6–15.2)
Prothrombin Time: 14 seconds (ref 11.6–15.2)
Prothrombin Time: 15.1 seconds (ref 11.6–15.2)
Prothrombin Time: 15.7 seconds — ABNORMAL HIGH (ref 11.6–15.2)

## 2014-11-17 LAB — PREPARE FRESH FROZEN PLASMA
Unit division: 0
Unit division: 0
Unit division: 0
Unit division: 0

## 2014-11-17 LAB — CBC WITH DIFFERENTIAL/PLATELET
Basophils Absolute: 0 10*3/uL (ref 0.0–0.1)
Basophils Relative: 0 % (ref 0–1)
Eosinophils Absolute: 0 10*3/uL (ref 0.0–0.7)
Eosinophils Relative: 0 % (ref 0–5)
HCT: 33.5 % — ABNORMAL LOW (ref 36.0–46.0)
Hemoglobin: 10.9 g/dL — ABNORMAL LOW (ref 12.0–15.0)
Lymphocytes Relative: 6 % — ABNORMAL LOW (ref 12–46)
Lymphs Abs: 0.5 10*3/uL — ABNORMAL LOW (ref 0.7–4.0)
MCH: 33 pg (ref 26.0–34.0)
MCHC: 32.5 g/dL (ref 30.0–36.0)
MCV: 101.5 fL — ABNORMAL HIGH (ref 78.0–100.0)
Monocytes Absolute: 0.3 10*3/uL (ref 0.1–1.0)
Monocytes Relative: 4 % (ref 3–12)
Neutro Abs: 7.8 10*3/uL — ABNORMAL HIGH (ref 1.7–7.7)
Neutrophils Relative %: 90 % — ABNORMAL HIGH (ref 43–77)
Platelets: 124 10*3/uL — ABNORMAL LOW (ref 150–400)
RBC: 3.3 MIL/uL — ABNORMAL LOW (ref 3.87–5.11)
RDW: 12.5 % (ref 11.5–15.5)
WBC: 8.6 10*3/uL (ref 4.0–10.5)

## 2014-11-17 LAB — MRSA PCR SCREENING: MRSA by PCR: NEGATIVE

## 2014-11-17 MED ORDER — ALUM & MAG HYDROXIDE-SIMETH 200-200-20 MG/5ML PO SUSP: 30.0000 mL | Freq: Four times a day (QID) | ORAL | Status: DC | PRN

## 2014-11-17 MED ORDER — SODIUM CHLORIDE 0.9 % IV SOLN: 250.0000 mL | INTRAVENOUS | Status: DC

## 2014-11-17 MED ORDER — SODIUM CHLORIDE 0.9 % IV SOLN
INTRAVENOUS | Status: DC
  Administered 2014-11-17: 01:00:00 via INTRAVENOUS

## 2014-11-17 MED ORDER — DEXAMETHASONE SODIUM PHOSPHATE 4 MG/ML IJ SOLN
4.0000 mg | Freq: Four times a day (QID) | INTRAMUSCULAR | Status: DC
  Administered 2014-11-17 – 2014-11-18 (×5): 4 mg via INTRAVENOUS
  Filled 2014-11-17 (×7): qty 1

## 2014-11-17 MED ORDER — SODIUM CHLORIDE 0.9 % IJ SOLN
3.0000 mL | INTRAMUSCULAR | Status: DC | PRN
  Administered 2014-11-19: 3 mL via INTRAVENOUS
  Filled 2014-11-17: qty 3

## 2014-11-17 MED ORDER — ONDANSETRON HCL 4 MG/2ML IJ SOLN
4.0000 mg | INTRAMUSCULAR | Status: DC | PRN
  Administered 2014-11-17 – 2014-11-26 (×8): 4 mg via INTRAVENOUS
  Filled 2014-11-17 (×8): qty 2

## 2014-11-17 MED ORDER — BACITRACIN ZINC 500 UNIT/GM EX OINT
TOPICAL_OINTMENT | CUTANEOUS | Status: DC | PRN
  Administered 2014-11-16: 1 via TOPICAL

## 2014-11-17 MED ORDER — MORPHINE SULFATE 2 MG/ML IJ SOLN
1.0000 mg | INTRAMUSCULAR | Status: DC | PRN
  Administered 2014-11-17: 1 mg via INTRAVENOUS
  Administered 2014-11-18 – 2014-11-25 (×6): 2 mg via INTRAVENOUS
  Administered 2014-11-26 (×3): 4 mg via INTRAVENOUS
  Administered 2014-11-26 – 2014-11-28 (×3): 2 mg via INTRAVENOUS
  Administered 2014-11-28 – 2014-11-29 (×2): 1 mg via INTRAVENOUS
  Filled 2014-11-17 (×9): qty 1
  Filled 2014-11-17 (×2): qty 2
  Filled 2014-11-17 (×2): qty 1
  Filled 2014-11-17: qty 2
  Filled 2014-11-17: qty 1

## 2014-11-17 MED ORDER — WHITE PETROLATUM GEL
Status: AC
  Filled 2014-11-17: qty 5

## 2014-11-17 MED ORDER — ACETAMINOPHEN 650 MG RE SUPP: 650.0000 mg | RECTAL | Status: DC | PRN

## 2014-11-17 MED ORDER — DIAZEPAM 5 MG PO TABS
5.0000 mg | ORAL_TABLET | Freq: Four times a day (QID) | ORAL | Status: DC | PRN
  Administered 2014-11-18 – 2014-11-27 (×10): 5 mg via ORAL
  Filled 2014-11-17 (×10): qty 1

## 2014-11-17 MED ORDER — ACETAMINOPHEN 325 MG PO TABS
650.0000 mg | ORAL_TABLET | ORAL | Status: DC | PRN
  Administered 2014-11-20: 650 mg via ORAL
  Filled 2014-11-17: qty 2

## 2014-11-17 MED ORDER — SODIUM CHLORIDE 0.9 % IJ SOLN
3.0000 mL | Freq: Two times a day (BID) | INTRAMUSCULAR | Status: DC
  Administered 2014-11-17 – 2014-11-29 (×14): 3 mL via INTRAVENOUS

## 2014-11-17 MED ORDER — CEFAZOLIN SODIUM 1-5 GM-% IV SOLN
1.0000 g | Freq: Three times a day (TID) | INTRAVENOUS | Status: AC
  Administered 2014-11-17 (×2): 1 g via INTRAVENOUS
  Filled 2014-11-17 (×2): qty 50

## 2014-11-17 MED ORDER — DIPHENHYDRAMINE HCL 25 MG PO CAPS
25.0000 mg | ORAL_CAPSULE | ORAL | Status: DC | PRN
Start: 2014-11-17 — End: 2014-11-29
  Administered 2014-11-18: 25 mg via ORAL
  Filled 2014-11-17: qty 1

## 2014-11-17 MED ORDER — PROMETHAZINE HCL 25 MG/ML IJ SOLN: 12.5000 mg | INTRAMUSCULAR | Status: DC | PRN

## 2014-11-17 MED ORDER — DEXAMETHASONE 4 MG PO TABS
4.0000 mg | ORAL_TABLET | Freq: Four times a day (QID) | ORAL | Status: DC
  Administered 2014-11-17 – 2014-11-19 (×5): 4 mg via ORAL
  Filled 2014-11-17 (×8): qty 1

## 2014-11-17 MED ORDER — DIPHENHYDRAMINE HCL 50 MG/ML IJ SOLN
12.5000 mg | Freq: Four times a day (QID) | INTRAMUSCULAR | Status: DC | PRN
  Administered 2014-11-18: 12.5 mg via INTRAVENOUS
  Filled 2014-11-17 (×2): qty 1

## 2014-11-17 NOTE — Progress Notes (Signed)
Patient ID: [name] Weber, female   DOB: 05/31/1931, 80 y.o.   MRN: [id number] Stable. sensoty present to pinprick. Able to df, pf both feet. Some quadriceps movement

## 2014-11-17 NOTE — Op Note (Signed)
[name], [name]             ACCOUNT NO.:  [id number]  MEDICAL RECORD NO.:  [id number]  LOCATION:  [id number]                        FACILITY:  [facility]  PHYSICIAN:  Hilda LiasErnesto Deira Shimer, M.D.   DATE OF BIRTH:  [birth year]  DATE OF PROCEDURE:  11/16/2014 DATE OF DISCHARGE:                              OPERATIVE REPORT   PREOPERATIVE DIAGNOSIS:  Lower thoracic hematoma intra or extramedullary with paraplegia from T10-T12.  POSTOPERATIVE DIAGNOSIS:  Lower thoracic hematoma intra or extramedullary with paraplegia from T11-T12-l1  PROCEDURE:  Bilaterally thoracic , 11,  12 l1 laminectomy. Decompression of the epidural space.  Removal of the subdural hematoma. Decompression of the cord.  Dural graft.  Microscope.  SURGEON:  Hilda LiasErnesto Stephaie Dardis, MD  ASSISTANT:  Stefani DamaHenry J. Elsner, MD  CLINICAL HISTORY:  The patient this morning was complaining of heaviness in both legs, and suddenly she was unable to move her legs.  She was brought to the emergency room about 9:30 a.m.  She was having quite a bit of intense pain.  The patient finally had thoracolumbar MRI, and we were called 3 hours ago because of the findings in the MRI.  By the time I saw [name] Weber, she was unable to move her legs.  She had some only proprioception.  She had no rectal tone.  The MRI showed a large hematoma with displacement of the spinal cord between thoracic 10-12. It was difficult but it looked like the hematoma was mostly epidural.  I talked to her, I talked to her friends and we agreed with surgery as possible.  She had been taking Coumadin for atrial fibrillation.  We started immediately vitamin K and Kcentra.  I talked to everybody about the possibility of no improvement, whatsoever.  DESCRIPTION OF PROCEDURE:  The patient was taken to the OR, and after intubation, she was positioned in a prone manner.  The lumbar thoracic area was cleaned with DuraPrep and drapes were applied.  She was quite skinny, I was able to feel  the 12th rib.  Then, a midline incision starting in L1 and T12 went up for at least 3-4 level was made through the skin, subcutaneous tissue, with dissection of the muscles.  I started in the #11 spinous process removal of that process as well as the   12   And l1  Laminectomy was done immediately.  We found some epidural blood, but immediately the dura mater was quite tense and dark. We brought the microscope into the area.  We did our midline incision and indeed, the patient had subdural hematoma with displacement of the spinal cord from the left to the right but also with some compromise in the right side.  Microdissection was done.  The most difficult part was to stop the bleeding which was done using more fresh frozen plasma as well as vitamin K.  Using microdissection technique, we were able to decompress the spinal cord.  From then on, we used Dura-Guard to fashion a pouch to allow the spinal cord to swell.  The graft was pulled back in place using 6-0 Prolene with continuous stitches.  The graft was probably about 5 cm x 3 of width.  From then on, the  area was irrigated. A large Hemovac was left in the epidural area.  The wound was closed with different layers of Vicryl and staples.  The patient is going to go to PACU, and from then on, she is going to go to the intensive care unit. We are going to get consulted by Rehabilitation Service as soon as possible.          ______________________________ Hilda LiasErnesto Pepe Mineau, M.D.     EB/MEDQ  D:  11/16/2014  T:  11/17/2014  Job:  [id number]

## 2014-11-17 NOTE — Evaluation (Signed)
Physical Therapy Evaluation Patient Details Name: [name] Weber MRN: [id number] DOB: 08/26/1931 Today's Date: 11/17/2014   History of Present Illness  pt present post Evacuation of Thoracic Epidural Hematoma with cord compression.    Clinical Impression  Pt weak in bil LEs and has difficulty coordinating LEs during movement, R>L.  During transfer pt's HR elevated to 178 and returned to 110's to 130 once sitting in recliner.  RN made aware of HR.  Pt indicates she only has her neighbors and friends to A her at D/C and would benefit from SNF stay to give pt time to return to [place] prior to returning to home alone.  Will continue to follow.      Follow Up Recommendations SNF    Equipment Recommendations  None recommended by PT    Recommendations for Other Services       Precautions / Restrictions Precautions Precautions: Fall;Back Precaution Booklet Issued: No Restrictions Weight Bearing Restrictions: No      Mobility  Bed Mobility Overal bed mobility: Needs Assistance Bed Mobility: Rolling;Sidelying to Sit Rolling: Min assist Sidelying to sit: Mod assist       General bed mobility comments: cues for log roll technique.  A with movement of Bil LEs and with bringing trunk up to sitting.    Transfers Overall transfer level: Needs assistance Equipment used: 2 person hand held assist Transfers: Sit to/from UGI CorporationStand;Stand Pivot Transfers Sit to Stand: Mod assist;+2 physical assistance Stand pivot transfers: Max assist;+2 physical assistance       General transfer comment: cues for UE use and positioning of LEs when coming to stand.  pt seems to have increased difficulty coordinating movement of R LE.  pt tends to try to grab at staff and needs firm cueing about placement of UEs.    Ambulation/Gait                Stairs            Wheelchair Mobility    Modified Rankin (Stroke Patients Only)       Balance Overall balance assessment: Needs  assistance Sitting-balance support: Bilateral upper extremity supported;Feet supported Sitting balance-Leahy Scale: Poor Sitting balance - Comments: pt needs Bil UE support and has difficulty maintaining midline.  pt with mild sway and at times needed MinA to correct balance.     Standing balance support: During functional activity Standing balance-Leahy Scale: Zero                               Pertinent Vitals/Pain Pain Assessment: No/denies pain    Home Living Family/patient expects to be discharged to:: Skilled nursing facility Living Arrangements: Alone                    Prior Function Level of [place]: [place]               Hand Dominance        Extremity/Trunk Assessment   Upper Extremity Assessment: Defer to OT evaluation           Lower Extremity Assessment: RLE deficits/detail;LLE deficits/detail RLE Deficits / Details: Hip strength grossly 4-/5, knee and ankle strength grossly 3/5.  pt with decreased coordination.   LLE Deficits / Details: hip strength grossly 4-/5, knee/ankle strength grossly 3/5.  Decreased coordination.    Cervical / Trunk Assessment: Normal  Communication   Communication: HOH  Cognition Arousal/Alertness: Awake/alert Behavior During Therapy: WFL for tasks  assessed/performed Overall Cognitive Status: Within Functional Limits for tasks assessed                      General Comments      Exercises        Assessment/Plan    PT Assessment Patient needs continued PT services  PT Diagnosis Difficulty walking;Generalized weakness   PT Problem List Decreased strength;Decreased activity tolerance;Decreased balance;Decreased mobility;Decreased coordination;Decreased knowledge of use of DME;Cardiopulmonary status limiting activity  PT Treatment Interventions DME instruction;Gait training;Functional mobility training;Therapeutic activities;Therapeutic exercise;Balance training;Neuromuscular  re-education;Patient/family education   PT Goals (Current goals can be found in the Care Plan section) Acute Rehab PT Goals Patient Stated Goal: Back independent.   PT Goal Formulation: With patient Time For Goal Achievement: 12/01/14 Potential to Achieve Goals: Good    Frequency Min 3X/week   Barriers to discharge        Co-evaluation               End of Session Equipment Utilized During Treatment: Gait belt Activity Tolerance: Patient limited by fatigue Patient left: in chair;with call bell/phone within reach Nurse Communication: Mobility status (HR)         Time: [id number] PT Time Calculation (min) (ACUTE ONLY): 17 min   Charges:   PT Evaluation $Initial PT Evaluation Tier I: 1 Procedure PT Treatments $Therapeutic Activity: 8-22 mins   PT G CodesSunny Schlein:          Delmer Kowalski F, [place]PT [phone] 11/17/2014, 9:35 AM

## 2014-11-17 NOTE — Progress Notes (Signed)
Pt attempted to void first time post catheter removal- unable to void on bedside commode. Bladder scanner- 608. I&O cath done- 450.

## 2014-11-17 NOTE — Progress Notes (Signed)
Patient ID: [name] Weber, female   DOB: 02/05/1931, 80 y.o.   MRN: [id number] Awake, stronger in both legs. Sensory normal. No headache. Some drainage in the hemovac. Rehabilitation to see her

## 2014-11-17 NOTE — Progress Notes (Deleted)
Patient ID: [name] Weber, female   DOB: 08/24/1931, 80 y.o.   MRN: [id number] Awake, follows commands. No weakness. Pupils ERR. Ct head pending. Spoke with husband

## 2014-11-17 NOTE — Progress Notes (Signed)
Patient ID: [name] Weber, female   DOB: 08/25/1931, 80 y.o.   MRN: [id number] Vital signs are stable Demonstrates 3 out of 5 motor function in iliopsoas quad tibialis anterior and gastrocs bilaterally Notes sensation is better but still somewhat diminished Overall patient is having marked improvement Okay to transfer to floor but will remain on telemetry

## 2014-11-17 NOTE — Progress Notes (Signed)
Called Maebelle MunroeKay Sasser, niece, at 0630 per pt request. Updated niece on pt's location, diagnosis, progression/plan of care, passcode, and unit number. Niece verbalized understanding. Encourage Joyce GrossKay to contact son and pass along information.

## 2014-11-17 NOTE — Progress Notes (Signed)
eLink Physician-Brief Progress Note Patient Name: [name] Weber DOB: 05/07/1931 MRN: [id number]   Date of Service  11/17/2014  HPI/Events of Note  [age] F on coumadin for chronic AF admitted following a fall with inability to walk.  To OR for emergent evacuation of thoracic hematoma.  Patient is alert, HD stable in NAD.  eICU Interventions  Plan: Continue to monitor No Elink intervention required at present     Intervention Category Evaluation Type: New Patient Evaluation  Rayme Bui 11/17/2014, 1:39 AM

## 2014-11-18 ENCOUNTER — Inpatient Hospital Stay (HOSPITAL_COMMUNITY): Payer: Medicare Other

## 2014-11-18 DIAGNOSIS — R29898 Other symptoms and signs involving the musculoskeletal system: Secondary | ICD-10-CM

## 2014-11-18 LAB — PROTIME-INR
INR: 1.12 (ref 0.00–1.49)
Prothrombin Time: 14.5 seconds (ref 11.6–15.2)

## 2014-11-18 NOTE — Progress Notes (Signed)
Patient ID: [name] Weber, female   DOB: 07/20/1931, 80 y.o.   MRN: [id number] BP 140/81 mmHg  Pulse 84  Temp(Src) 98 F (36.7 C) (Oral)  Resp 22  Ht 5\' 6"  (1.676 m)  Wt 59.8 kg (131 lb 13.4 oz)  BMI 21.29 kg/m2  SpO2 95% All films reviewed. However this is now moot since the patient is moving both lower extremities vigorously. She has execellent plantarflexion, dorsiflexion, knee flexion and extension. Clear improvement since this am. No indication for surgery at this time. Have discussed with [name], her son and niece along with family friends. They understand and agree with no surgery.

## 2014-11-18 NOTE — Progress Notes (Signed)
SUBJECTIVE:  Borderline rate control.  Better while she is asleep.  Nursing note reviewed regarding worsening weakness in the legs.    OBJECTIVE:   Vitals:   Filed Vitals:   11/17/14 1816 11/17/14 2040 11/18/14 0128 11/18/14 0623  BP: 155/96 164/86 165/92 158/95  Pulse: 76 110 97   Temp: 98.6 F (37 C) 99.5 F (37.5 C) 99.1 F (37.3 C) 98.6 F (37 C)  TempSrc: Oral Oral Oral Oral  Resp: 20  24 24   Height:      Weight:      SpO2: 94% 92% 94% 93%   I&O's:   Intake/Output Summary (Last 24 hours) at 11/18/14 0856 Last data filed at 11/18/14 0010  Gross per 24 hour  Intake   1235 ml  Output   3010 ml  Net  -1775 ml   TELEMETRY: Reviewed telemetry pt in atrial flutter:     PHYSICAL EXAM General: Well developed, well nourished, in no acute distress Head:   Normal cephalic and atramatic  Lungs: No wheezing Heart: Irregular S1 S2  No JVD.   Abdomen: abdomen soft and non-tender     LABS: Basic Metabolic Panel:  Recent Labs  16/10/96 1000  NA 142  K 4.4  CL 101  CO2 28  GLUCOSE 162*  BUN 17  CREATININE 0.60  CALCIUM 9.4   Liver Function Tests:  Recent Labs  11/16/14 1000  AST 41*  ALT 33  ALKPHOS 56  BILITOT 0.6  PROT 7.0  ALBUMIN 3.6   No results for input(s): LIPASE, AMYLASE in the last 72 hours. CBC:  Recent Labs  11/16/14 1000 11/16/14 2330  WBC 10.6* 8.6  NEUTROABS  --  7.8*  HGB 13.7 10.9*  HCT 42.7 33.5*  MCV 99.5 101.5*  PLT 156 124*   Cardiac Enzymes: No results for input(s): CKTOTAL, CKMB, CKMBINDEX, TROPONINI in the last 72 hours. BNP: Invalid input(s): POCBNP D-Dimer: No results for input(s): DDIMER in the last 72 hours. Hemoglobin A1C: No results for input(s): HGBA1C in the last 72 hours. Fasting Lipid Panel: No results for input(s): CHOL, HDL, LDLCALC, TRIG, CHOLHDL, LDLDIRECT in the last 72 hours. Thyroid Function Tests: No results for input(s): TSH, T4TOTAL, T3FREE, THYROIDAB in the last 72 hours.  Invalid  input(s): FREET3 Anemia Panel: No results for input(s): VITAMINB12, FOLATE, FERRITIN, TIBC, IRON, RETICCTPCT in the last 72 hours. Coag Panel:   Lab Results  Component Value Date   INR 1.12 11/18/2014   INR 1.07 11/17/2014   INR 1.01 11/17/2014    RADIOLOGY: Ct Abdomen Pelvis Wo Contrast  11/16/2014   CLINICAL DATA:  Right flank pain for 1 day with nausea and vomiting. Initial encounter.  EXAM: CT ABDOMEN AND PELVIS WITHOUT CONTRAST  TECHNIQUE: Multidetector CT imaging of the abdomen and pelvis was performed following the standard protocol without IV contrast.  COMPARISON:  05/27/2010 abdominal pelvic CT.  FINDINGS: Lower chest: Clear lung bases. No significant pleural or pericardial effusion.  Hepatobiliary: As evaluated in the noncontrast state, the liver appears normal without focal abnormality. No evidence of gallstones, gallbladder wall thickening or biliary dilatation.  Pancreas: Unremarkable. No pancreatic ductal dilatation or surrounding inflammatory changes.  Spleen: Normal in size without focal abnormality.  Adrenals/Urinary Tract: Both adrenal glands appear normal.There is no evidence of urinary tract calculus or hydronephrosis. This is small parenchymal calcification in the upper pole of the left kidney. A simple cyst posteriorly in the left kidney appears stable. The distal ureters are not well visualized, but  no ureteral or bladder calculi are seen.  Stomach/Bowel: No evidence of bowel wall thickening, distention or surrounding inflammatory change.Mild distal colonic diverticular changes are present.  Vascular/Lymphatic: There are no enlarged abdominal or pelvic lymph nodes. There is diffuse aortoiliac atherosclerosis.  Reproductive: Status post hysterectomy. No evidence of adnexal mass.  Other: No evidence of abdominal wall mass or hernia.  Musculoskeletal: No acute or significant osseous findings. There are degenerative changes throughout the lumbar spine associated with a convex left  scoliosis.  IMPRESSION: IMPRESSION 1. No evidence of urinary tract calculus or hydronephrosis. 2. No acute findings identified. 3. Diffuse atherosclerosis and mild distal colonic diverticulosis noted.   Electronically Signed   By: Roxy Horseman M.D.   On: 11/16/2014 10:43   Dg Chest 2 View  11/13/2014   CLINICAL DATA:  Recent injury while working out with difficulty breathing and right-sided chest pain  EXAM: CHEST  2 VIEW  COMPARISON:  None.  FINDINGS: Cardiac shadow is within normal limits. The lungs are hyperinflated consistent with COPD. No pneumothorax or sizable effusion is seen. No focal infiltrate is noted.  IMPRESSION: COPD without acute abnormality.   Electronically Signed   By: Alcide Clever M.D.   On: 11/13/2014 12:09   Mr Lumbar Spine Wo Contrast  11/16/2014   CLINICAL DATA:  80 year old female with acute onset severe back pain after pole Oddi is class. Pain radiating anteriorly in the thorax, and progressing. Subsequent progressive weakness in her lower extremities, unable to walk. Current history of atrial fibrillation on Coumadin. Initial encounter.  EXAM: MRI THORACIC AND LUMBAR SPINE WITHOUT AND WITH CONTRAST  TECHNIQUE: Multiplanar and multiecho pulse sequences of the thoracic and lumbar spine were obtained without and with intravenous contrast.  CONTRAST:  10mL MULTIHANCE GADOBENATE DIMEGLUMINE 529 MG/ML IV SOLN  COMPARISON:  CT Abdomen and Pelvis without contrast 1024 hr today, and contrast-enhanced CT Abdomen and Pelvis 05/27/2010.  FINDINGS: MR THORACIC SPINE FINDINGS  Limited sagittal imaging the cervical spine remarkable for widespread chronic disc and endplate degeneration.  Thoracic vertebral height and alignment within normal limits, aside from mild anterolisthesis at T11-T12 which is stable since 2011. Benign upper thoracic vertebral body hemangiomas are suspected including at T3 where there is mild enhancement of the lesion (series 18, image 5). No marrow edema or evidence of  acute osseous abnormality.  Negative visualized posterior paraspinal soft tissues.  There is a small layering left pleural effusion. Dependent pulmonary atelectasis. Negative visualized upper abdominal viscera.  No upper thoracic spinal stenosis, however, there is abnormal upper thoracic spinal cord signal at the T4 level (series 5, image 7 and series 8, image 9). This signal appears to be located centrally and may also involve the dorsal columns. No cord expansion. No associated cord enhancement. The adjacent spinal cord segments appear within normal limits. No significant degenerative changes in this region.  Severe abnormality in the lower thoracic spine extending from the T10 to the T12 level, with a lobulated somewhat multiloculated appearing process occupying the ventral spinal canal at these levels, encompassing 9 x 14 x 55 mm (AP by transverse by CC). The abnormality is heterogeneously hyperintense on T2 imaging, hyperintense on STIR imaging, and nearly isointense to CSF on T1 precontrast imaging. However, the lesion significantly loses signal on axial gradient echo imaging. Following contrast are several curvilinear areas of enhancement, mostly along the periphery of the lesion (series 18, image 6). Precontrast images do not suggest favor this process is extradural, but intradural location is difficult to exclude.  There is associated cord compression. The cord is posteriorly displaced and compressed, maximally at T11 (series 8, image 32). There does appear to be abnormal associated cord signal.  The most distal spinal cord and conus medullaris are unaffected and appear within normal limits. No intra-axial enhancement.  No abnormality in this region was evident on the 2011 contrast-enhanced CT Abdomen and Pelvis.  MR LUMBAR SPINE FINDINGS  Lumbar segmentation is normal. Chronic L5-S1 anterolisthesis has mildly progressed since 2011. Mild retrolisthesis at L3-L4 has progressed. There are associated  degenerative changes at these levels, including advanced disc and endplate degeneration at L4-L5 were there is mild marrow edema. No acute osseous abnormality identified.  There is patchy increased STIR signal in some of the posterior paraspinal soft tissues, nonspecific.  The conus medullaris is normal at L1-L2. The space-occupying mass in the lower thoracic spine appears to terminate at the T12-L1 disc space level. Cauda equina nerve roots are within normal limits, aside from the degenerative spinal stenosis findings described below.  However, there is trace layering hemorrhage at the term in a shin of the thecal sac at the S2-S3 level (series 13, image 6 and series 16, image 33).  Visualized abdominal viscera are stable since 2011.  The following degenerative changes are noted in the lumbar spine.  L2-L3: Lobulated disc bulge and severe facet hypertrophy. Mild spinal and L2 biforaminal stenosis.  L3-L4: Severe facet and ligament flavum hypertrophy with facet joint fluid. Broad-based disc bulge. Moderate spinal stenosis and right L3 foraminal stenosis.  L4-L5: Severe multifactorial spinal stenosis with right eccentric disc bulge and severe facet hypertrophy. Moderate right L4 foraminal stenosis.  L5-S1: Severe facet hypertrophy and degeneration with facet joint fluid. Grade 1 anterolisthesis at this level. Moderate ligament flavum hypertrophy. Broad-based disc/ pseudo disc. Mild spinal stenosis. Moderate left lateral recess stenosis. Moderate left L5 foraminal stenosis.  Visible sacrum intact.  Diverticulosis of the sigmoid colon.  IMPRESSION: 1. Lower thoracic spinal cord compression with abnormal cord signal related to a lobulated roughly 1 x 1.5 x 5.5 cm space-occupying mass in the ventral spinal canal from the T10 to the T12 level. This most resembles hemorrhage/hematoma. This could be occupying the ventral epidural space (slightly favored) or intradural, and there is trace layering hemorrhage in the caudal  thecal sac at S2-S3. Legrand Rams this is posttraumatic in nature rather than related to a spinal/dural AVM, however, small curvilinear areas of enhancement along the periphery of the hematoma are noted (perhaps due to active bleeding). 2.  No acute osseous abnormality identified. 3. Superimposed T4 level abnormal central and dorsal spinal cord signal, nonspecific. No degenerative changes in the region. Perhaps this is sequelae of prior transverse myelitis or less likely small cord infarct. 4. No acute findings in the lumbar spine. Moderate to severe degenerative spinal stenosis at L3-L4 and L4-L5. Critical Value/emergent results were called by telephone at the time of interpretation on 11/16/2014 at 1836 hrs. to Dr. Judie Petit. Pheiffer in the ED who verbally acknowledged these results.   Electronically Signed   By: Augusto Gamble M.D.   On: 11/16/2014 19:13   Mr Thoracic Spine W Wo Contrast  11/16/2014   CLINICAL DATA:  80 year old female with acute onset severe back pain after pole Oddi is class. Pain radiating anteriorly in the thorax, and progressing. Subsequent progressive weakness in her lower extremities, unable to walk. Current history of atrial fibrillation on Coumadin. Initial encounter.  EXAM: MRI THORACIC AND LUMBAR SPINE WITHOUT AND WITH CONTRAST  TECHNIQUE: Multiplanar and multiecho  pulse sequences of the thoracic and lumbar spine were obtained without and with intravenous contrast.  CONTRAST:  10mL MULTIHANCE GADOBENATE DIMEGLUMINE 529 MG/ML IV SOLN  COMPARISON:  CT Abdomen and Pelvis without contrast 1024 hr today, and contrast-enhanced CT Abdomen and Pelvis 05/27/2010.  FINDINGS: MR THORACIC SPINE FINDINGS  Limited sagittal imaging the cervical spine remarkable for widespread chronic disc and endplate degeneration.  Thoracic vertebral height and alignment within normal limits, aside from mild anterolisthesis at T11-T12 which is stable since 2011. Benign upper thoracic vertebral body hemangiomas are suspected  including at T3 where there is mild enhancement of the lesion (series 18, image 5). No marrow edema or evidence of acute osseous abnormality.  Negative visualized posterior paraspinal soft tissues.  There is a small layering left pleural effusion. Dependent pulmonary atelectasis. Negative visualized upper abdominal viscera.  No upper thoracic spinal stenosis, however, there is abnormal upper thoracic spinal cord signal at the T4 level (series 5, image 7 and series 8, image 9). This signal appears to be located centrally and may also involve the dorsal columns. No cord expansion. No associated cord enhancement. The adjacent spinal cord segments appear within normal limits. No significant degenerative changes in this region.  Severe abnormality in the lower thoracic spine extending from the T10 to the T12 level, with a lobulated somewhat multiloculated appearing process occupying the ventral spinal canal at these levels, encompassing 9 x 14 x 55 mm (AP by transverse by CC). The abnormality is heterogeneously hyperintense on T2 imaging, hyperintense on STIR imaging, and nearly isointense to CSF on T1 precontrast imaging. However, the lesion significantly loses signal on axial gradient echo imaging. Following contrast are several curvilinear areas of enhancement, mostly along the periphery of the lesion (series 18, image 6). Precontrast images do not suggest favor this process is extradural, but intradural location is difficult to exclude.  There is associated cord compression. The cord is posteriorly displaced and compressed, maximally at T11 (series 8, image 32). There does appear to be abnormal associated cord signal.  The most distal spinal cord and conus medullaris are unaffected and appear within normal limits. No intra-axial enhancement.  No abnormality in this region was evident on the 2011 contrast-enhanced CT Abdomen and Pelvis.  MR LUMBAR SPINE FINDINGS  Lumbar segmentation is normal. Chronic L5-S1  anterolisthesis has mildly progressed since 2011. Mild retrolisthesis at L3-L4 has progressed. There are associated degenerative changes at these levels, including advanced disc and endplate degeneration at L4-L5 were there is mild marrow edema. No acute osseous abnormality identified.  There is patchy increased STIR signal in some of the posterior paraspinal soft tissues, nonspecific.  The conus medullaris is normal at L1-L2. The space-occupying mass in the lower thoracic spine appears to terminate at the T12-L1 disc space level. Cauda equina nerve roots are within normal limits, aside from the degenerative spinal stenosis findings described below.  However, there is trace layering hemorrhage at the term in a shin of the thecal sac at the S2-S3 level (series 13, image 6 and series 16, image 33).  Visualized abdominal viscera are stable since 2011.  The following degenerative changes are noted in the lumbar spine.  L2-L3: Lobulated disc bulge and severe facet hypertrophy. Mild spinal and L2 biforaminal stenosis.  L3-L4: Severe facet and ligament flavum hypertrophy with facet joint fluid. Broad-based disc bulge. Moderate spinal stenosis and right L3 foraminal stenosis.  L4-L5: Severe multifactorial spinal stenosis with right eccentric disc bulge and severe facet hypertrophy. Moderate right L4 foraminal stenosis.  L5-S1: Severe facet hypertrophy and degeneration with facet joint fluid. Grade 1 anterolisthesis at this level. Moderate ligament flavum hypertrophy. Broad-based disc/ pseudo disc. Mild spinal stenosis. Moderate left lateral recess stenosis. Moderate left L5 foraminal stenosis.  Visible sacrum intact.  Diverticulosis of the sigmoid colon.  IMPRESSION: 1. Lower thoracic spinal cord compression with abnormal cord signal related to a lobulated roughly 1 x 1.5 x 5.5 cm space-occupying mass in the ventral spinal canal from the T10 to the T12 level. This most resembles hemorrhage/hematoma. This could be occupying  the ventral epidural space (slightly favored) or intradural, and there is trace layering hemorrhage in the caudal thecal sac at S2-S3. Legrand RamsFavor this is posttraumatic in nature rather than related to a spinal/dural AVM, however, small curvilinear areas of enhancement along the periphery of the hematoma are noted (perhaps due to active bleeding). 2.  No acute osseous abnormality identified. 3. Superimposed T4 level abnormal central and dorsal spinal cord signal, nonspecific. No degenerative changes in the region. Perhaps this is sequelae of prior transverse myelitis or less likely small cord infarct. 4. No acute findings in the lumbar spine. Moderate to severe degenerative spinal stenosis at L3-L4 and L4-L5. Critical Value/emergent results were called by telephone at the time of interpretation on 11/16/2014 at 1836 hrs. to Dr. Judie PetitM. Pheiffer in the ED who verbally acknowledged these results.   Electronically Signed   By: Augusto GambleLee  Hall M.D.   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  Given this complication occuring with an INR of only 3.1, she may not be a candidate for long term anticoagulation in the future.Reasonable rate control at this time.  Corky CraftsJayadeep S Kamyra Schroeck, MD  11/18/2014  8:56 AM

## 2014-11-18 NOTE — Social Work (Signed)
Clinical Social Work Department BRIEF PSYCHOSOCIAL ASSESSMENT 11/18/2014  Patient:  [name] Weber,[name] Weber     Account Number:  [id number]     Admit date:  11/16/2014  Clinical Social Worker:  Eddie CandleLINDSEY,Sawyer Kahan, LCSW  Date/Time:  11/18/2014 04:56 PM  Referred by:  Physician  Date Referred:  11/18/2014 Referred for  SNF Placement   Other Referral:   Interview type:  Patient Other interview type:   Patient's neice [name] Weber, [phone](Weber), was also present and patient stated that she wanted to contact her if there is a need to contact family.    PSYCHOSOCIAL DATA Living Status:  ALONE Admitted from facility:   Level of care:   Primary support name:  [name] Weber Primary support relationship to patient:  FAMILY Degree of support available:   Good support, but very independent, has very few needs from others.    CURRENT CONCERNS Current Concerns  Post-Acute Placement   Other Concerns:    SOCIAL WORK ASSESSMENT / PLAN Patient states that she had a very busy and active life before this hospitlization.  Patient states that she hopes to return back to that level of funcitoning.  Patient states that she is willing to go to a skilled nursing facility for care but has visitied several places and does not want to go to a few key facilities including [facility], and [facility] Patient is interested in [facility], [facility], [facility], [facility] if they have silled nursing facilities.   Assessment/plan status:  [facility] to [facility] Other assessment/ plan:   Information/referral to community resources:    PATIENT'S/FAMILY'S RESPONSE TO PLAN OF CARE: CSW will fax patient out to [place] except for the 3 facilities that are listed above that patient is refusing. CSW will continue to follow and patient is in agreement with this plan.

## 2014-11-18 NOTE — Progress Notes (Signed)
Patient ID: [name] Weber, female   DOB: 12/11/1930, 80 y.o.   MRN: [id number] MRI not clear with regards to recurrent blood clot v. Gas in the subdural space. Regardless there is less compression now than preop. Still cannot fully explain weakness today v improvement yesterday. Ordering a CT of the thoracic, and lumbar spine .

## 2014-11-18 NOTE — Progress Notes (Signed)
Patient ID: [name] Weber, female   DOB: 07/23/1931, 80 y.o.   MRN: [id number] BP 154/107 mmHg  Pulse 74  Temp(Src) 97.7 F (36.5 C) (Oral)  Resp 20  Ht 5\' 6"  (1.676 m)  Wt 59.8 kg (131 lb 13.4 oz)  BMI 21.29 kg/m2  SpO2 95% Alert, no longer with movement proximal musculature lower extremities Weak in dorsiflexors bilaterally, plantar flexion intact though quite weak Proprioception is intact Wound dressing is blood stained Will order MRI lower extremities now.

## 2014-11-18 NOTE — Progress Notes (Signed)
Patient's legs has gotten weaker, she was able to move her legs side to side with slight numbness in her thighs. Now she can only move her feet and dorsal flexion is weaker. Notified Dr. Danielle DessElsner. No new orders at this time. Will continue to monitor.

## 2014-11-18 NOTE — Progress Notes (Signed)
MD paged, MRI & CT results back. Family asks about results frequently.

## 2014-11-19 ENCOUNTER — Encounter (HOSPITAL_COMMUNITY): Payer: Self-pay | Admitting: Neurosurgery

## 2014-11-19 ENCOUNTER — Ambulatory Visit: Payer: Medicare Other | Admitting: Cardiology

## 2014-11-19 ENCOUNTER — Other Ambulatory Visit: Payer: Medicare Other

## 2014-11-19 LAB — CBC
HCT: 39.8 % (ref 36.0–46.0)
Hemoglobin: 13.1 g/dL (ref 12.0–15.0)
MCH: 32 pg (ref 26.0–34.0)
MCHC: 32.9 g/dL (ref 30.0–36.0)
MCV: 97.1 fL (ref 78.0–100.0)
Platelets: 146 10*3/uL — ABNORMAL LOW (ref 150–400)
RBC: 4.1 MIL/uL (ref 3.87–5.11)
RDW: 12.1 % (ref 11.5–15.5)
WBC: 11.8 10*3/uL — ABNORMAL HIGH (ref 4.0–10.5)

## 2014-11-19 LAB — TYPE AND SCREEN
ABO/RH(D): A POS
Antibody Screen: NEGATIVE
Unit division: 0
Unit division: 0

## 2014-11-19 LAB — BASIC METABOLIC PANEL
Anion gap: 11 (ref 5–15)
BUN: 18 mg/dL (ref 6–23)
CO2: 28 mEq/L (ref 19–32)
Calcium: 9 mg/dL (ref 8.4–10.5)
Chloride: 100 mEq/L (ref 96–112)
Creatinine, Ser: 0.52 mg/dL (ref 0.50–1.10)
GFR calc Af Amer: >90 mL/min (ref >90)
GFR calc non Af Amer: 87 mL/min — ABNORMAL LOW (ref >90)
Glucose, Bld: 131 mg/dL — ABNORMAL HIGH (ref 70–99)
Potassium: 4.4 mEq/L (ref 3.7–5.3)
Sodium: 139 mEq/L (ref 137–147)

## 2014-11-19 LAB — POCT I-STAT 4, (NA,K, GLUC, HGB,HCT)
Glucose, Bld: 196 mg/dL — ABNORMAL HIGH (ref 70–99)
HCT: 40 % (ref 36.0–46.0)
Hemoglobin: 13.6 g/dL (ref 12.0–15.0)
Potassium: 5.7 mEq/L — ABNORMAL HIGH (ref 3.7–5.3)
Sodium: 134 mEq/L — ABNORMAL LOW (ref 137–147)

## 2014-11-19 LAB — PROTIME-INR
INR: 1.1 (ref 0.00–1.49)
Prothrombin Time: 14.4 seconds (ref 11.6–15.2)

## 2014-11-19 MED ORDER — DILTIAZEM HCL 30 MG PO TABS
30.0000 mg | ORAL_TABLET | Freq: Four times a day (QID) | ORAL | Status: DC
  Administered 2014-11-19 – 2014-11-20 (×3): 30 mg via ORAL
  Filled 2014-11-19 (×4): qty 1

## 2014-11-19 MED ORDER — BISACODYL 10 MG RE SUPP
10.0000 mg | Freq: Once | RECTAL | Status: AC
  Administered 2014-11-19: 10 mg via RECTAL
  Filled 2014-11-19: qty 1

## 2014-11-19 MED ORDER — TAMSULOSIN HCL 0.4 MG PO CAPS
0.8000 mg | ORAL_CAPSULE | Freq: Every day | ORAL | Status: DC
  Administered 2014-11-19 – 2014-11-29 (×11): 0.8 mg via ORAL
  Filled 2014-11-19 (×12): qty 2

## 2014-11-19 NOTE — Progress Notes (Signed)
Patient ID: [name] Weber, female   DOB: 03/21/1931, [age] y.o.   MRN: [id number] Spoke with her and friend. Ok to go to SNF. BOWEL AND BLADDER REGIMEN STARTED

## 2014-11-19 NOTE — Progress Notes (Signed)
Thank you for consult on [name] Weber. Therapy SNF recommended for follow up therapy due to weakness as well as poor social supports. Will defer CIR consult for now.

## 2014-11-19 NOTE — Progress Notes (Signed)
Patient ID: [name] Weber, female   DOB: [birth year], 80 y.o.   MRN: [id number]    SUBJECTIVE:  The patient has a long-standing history of atrial flutter. When she is been seen in the office by Dr. Patty Sermons, her heart rate measurement is usually under control. Here in the hospital she has had some increased heart rate. She does not feel the increased heart rate. Her blood pressure is quite stable and even on the high side.   Filed Vitals:   11/18/14 2130 11/19/14 0131 11/19/14 0556 11/19/14 1002  BP: 137/97 143/95 166/100 143/90  Pulse: 99 88 77 99  Temp: 98.4 F (36.9 C) 98.6 F (37 C) 98.1 F (36.7 C) 98 F (36.7 C)  TempSrc: Oral Oral Oral Oral  Resp: 18 18 18 20   Height:      Weight:      SpO2: 94% 95% 96% 95%     Intake/Output Summary (Last 24 hours) at 11/19/14 1104 Last data filed at 11/19/14 0700  Gross per 24 hour  Intake      0 ml  Output   3320 ml  Net  -3320 ml    LABS: Basic Metabolic Panel:  Recent Labs  14/78/29 2233 11/19/14 0540  NA 134* 139  K 5.7* 4.4  CL  --  100  CO2  --  28  GLUCOSE 196* 131*  BUN  --  18  CREATININE  --  0.52  CALCIUM  --  9.0   Liver Function Tests: No results for input(s): AST, ALT, ALKPHOS, BILITOT, PROT, ALBUMIN in the last 72 hours. No results for input(s): LIPASE, AMYLASE in the last 72 hours. CBC:  Recent Labs  11/16/14 2330 11/19/14 0540  WBC 8.6 11.8*  NEUTROABS 7.8*  --   HGB 10.9* 13.1  HCT 33.5* 39.8  MCV 101.5* 97.1  PLT 124* 146*   Cardiac Enzymes: No results for input(s): CKTOTAL, CKMB, CKMBINDEX, TROPONINI in the last 72 hours. BNP: Invalid input(s): POCBNP D-Dimer: No results for input(s): DDIMER in the last 72 hours. Hemoglobin A1C: No results for input(s): HGBA1C in the last 72 hours. Fasting Lipid Panel: No results for input(s): CHOL, HDL, LDLCALC, TRIG, CHOLHDL, LDLDIRECT in the last 72 hours. Thyroid Function Tests: No results for input(s): TSH, T4TOTAL, T3FREE, THYROIDAB in the  last 72 hours.  Invalid input(s): FREET3  RADIOLOGY: Ct Abdomen Pelvis Wo Contrast  11/16/2014   CLINICAL DATA:  Right flank pain for 1 day with nausea and vomiting. Initial encounter.  EXAM: CT ABDOMEN AND PELVIS WITHOUT CONTRAST  TECHNIQUE: Multidetector CT imaging of the abdomen and pelvis was performed following the standard protocol without IV contrast.  COMPARISON:  05/27/2010 abdominal pelvic CT.  FINDINGS: Lower chest: Clear lung bases. No significant pleural or pericardial effusion.  Hepatobiliary: As evaluated in the noncontrast state, the liver appears normal without focal abnormality. No evidence of gallstones, gallbladder wall thickening or biliary dilatation.  Pancreas: Unremarkable. No pancreatic ductal dilatation or surrounding inflammatory changes.  Spleen: Normal in size without focal abnormality.  Adrenals/Urinary Tract: Both adrenal glands appear normal.There is no evidence of urinary tract calculus or hydronephrosis. This is small parenchymal calcification in the upper pole of the left kidney. A simple cyst posteriorly in the left kidney appears stable. The distal ureters are not well visualized, but no ureteral or bladder calculi are seen.  Stomach/Bowel: No evidence of bowel wall thickening, distention or surrounding inflammatory change.Mild distal colonic diverticular changes are present.  Vascular/Lymphatic: There are no enlarged  abdominal or pelvic lymph nodes. There is diffuse aortoiliac atherosclerosis.  Reproductive: Status post hysterectomy. No evidence of adnexal mass.  Other: No evidence of abdominal wall mass or hernia.  Musculoskeletal: No acute or significant osseous findings. There are degenerative changes throughout the lumbar spine associated with a convex left scoliosis.  IMPRESSION: IMPRESSION 1. No evidence of urinary tract calculus or hydronephrosis. 2. No acute findings identified. 3. Diffuse atherosclerosis and mild distal colonic diverticulosis noted.    Electronically Signed   By: Roxy Horseman M.D.   On: 11/16/2014 10:43   Dg Chest 2 View  11/13/2014   CLINICAL DATA:  Recent injury while working out with difficulty breathing and right-sided chest pain  EXAM: CHEST  2 VIEW  COMPARISON:  None.  FINDINGS: Cardiac shadow is within normal limits. The lungs are hyperinflated consistent with COPD. No pneumothorax or sizable effusion is seen. No focal infiltrate is noted.  IMPRESSION: COPD without acute abnormality.   Electronically Signed   By: Alcide Clever M.D.   On: 11/13/2014 12:09   Ct Thoracic Spine Wo Contrast  11/18/2014   CLINICAL DATA:  80 year old female who presented with back pain and progressive lower extremity weakness, found to have T10 level spinal hematoma subsequently determined to be mostly subdural on neurosurgical decompression and evacuation. Initially significantclinical improvement in symptoms, but now with lower extremity recurrent weakness. Postoperative lumbar MRI with increased ventral spinal, but possibly superimposed on a significant amount of intraspinal gas. Further evaluation of that finding with CT. Initial encounter.  EXAM: CT THORACIC AND LUMBAR SPINE WITHOUT CONTRAST  TECHNIQUE: Multidetector CT imaging of the thoracic and lumbar spine was performed without contrast. Multiplanar CT image reconstructions were also generated.  COMPARISON:  Postoperative lumbar MRI today at 1138 hrs. Preoperative thoracic and lumbar MRI 11/16/2014.  FINDINGS: CT THORACIC SPINE FINDINGS  Posterior element postoperative changes from the T11 spinous process continuing into the upper lumbar spine. There is a percutaneous postoperative drain in place, extending deep into the laminectomy space and terminating along the left L2 spinous process. There is a small volume of superficial and laminectomy space postoperative gas.  With regard to the dark T2/stir signal seen along the ventral spinal canal in the lower thoracic and lumbar spine, there is only a  small volume of gas present in the ventral canal posterior to the L1 vertebral body, and at the T12-L1 disc space. There is vacuum disc phenomena at T12-L1. No other gas within the thoracic spinal canal. With narrow CT windows, the ventral spinal canal density is seen to be hyperdense to CSF compatible with blood products (see series 207, image 19 and axial images 122 and 153 of series 202). As noted on the recent MRI this tapers cephalad to the T7 level.  Moderate size layering bilateral pleural effusions. Compressive atelectasis in both lungs, but more confluent left upper lobe opacity (series 201, image 43).  Calcified atherosclerosis of the thoracic aorta.  CT LUMBAR SPINE FINDINGS  Stable lumbar vertebral height and alignment.  Postoperative changes to the posterior elements from the lower thoracic spine continuing to the L1 lamina. Postoperative drain in place as detailed above.  As stated above there is only a small volume of gas in the ventral spinal canal at the L1 level, and also along the right lateral aspect of the right L1-L2 facet. Therefore, the bulk of the space-occupying process seen on the postoperative MRI is blood products. With narrow CT windows, hyperdense blood products are visible in the ventral canal,  and more circumferentially in the lower lumbar spine (e.g. L3-L4 series 202, image 65).  Small volume superficial postoperative gas in the subcutaneous fat. Subcutaneous edema. Skin staples in place.  Aortoiliac calcified atherosclerosis noted. Visible sacrum and SI joints intact.  IMPRESSION: 1. Only trace gas within the spinal canal, limited to the L1 level. Therefore, the (mostly ventral) space-occupying process seen by lumbar MRI earlier today tracking from the lumbar spine cephalad and tapering at the T7 level is blood products. 2. Postoperative changes to the posterior elements T11 through L1. Laminectomy space drain in place. 3. Moderate bilateral layering pleural effusions. Compressive  atelectasis in both lungs, but more confluent left upper lobe opacity suspicious for developing pneumonia.   Electronically Signed   By: Augusto Gamble M.D.   On: 11/18/2014 14:10   Ct Lumbar Spine Wo Contrast  11/18/2014   CLINICAL DATA:  80 year old female who presented with back pain and progressive lower extremity weakness, found to have T10 level spinal hematoma subsequently determined to be mostly subdural on neurosurgical decompression and evacuation. Initially significantclinical improvement in symptoms, but now with lower extremity recurrent weakness. Postoperative lumbar MRI with increased ventral spinal, but possibly superimposed on a significant amount of intraspinal gas. Further evaluation of that finding with CT. Initial encounter.  EXAM: CT THORACIC AND LUMBAR SPINE WITHOUT CONTRAST  TECHNIQUE: Multidetector CT imaging of the thoracic and lumbar spine was performed without contrast. Multiplanar CT image reconstructions were also generated.  COMPARISON:  Postoperative lumbar MRI today at 1138 hrs. Preoperative thoracic and lumbar MRI 11/16/2014.  FINDINGS: CT THORACIC SPINE FINDINGS  Posterior element postoperative changes from the T11 spinous process continuing into the upper lumbar spine. There is a percutaneous postoperative drain in place, extending deep into the laminectomy space and terminating along the left L2 spinous process. There is a small volume of superficial and laminectomy space postoperative gas.  With regard to the dark T2/stir signal seen along the ventral spinal canal in the lower thoracic and lumbar spine, there is only a small volume of gas present in the ventral canal posterior to the L1 vertebral body, and at the T12-L1 disc space. There is vacuum disc phenomena at T12-L1. No other gas within the thoracic spinal canal. With narrow CT windows, the ventral spinal canal density is seen to be hyperdense to CSF compatible with blood products (see series 207, image 19 and axial images  122 and 153 of series 202). As noted on the recent MRI this tapers cephalad to the T7 level.  Moderate size layering bilateral pleural effusions. Compressive atelectasis in both lungs, but more confluent left upper lobe opacity (series 201, image 43).  Calcified atherosclerosis of the thoracic aorta.  CT LUMBAR SPINE FINDINGS  Stable lumbar vertebral height and alignment.  Postoperative changes to the posterior elements from the lower thoracic spine continuing to the L1 lamina. Postoperative drain in place as detailed above.  As stated above there is only a small volume of gas in the ventral spinal canal at the L1 level, and also along the right lateral aspect of the right L1-L2 facet. Therefore, the bulk of the space-occupying process seen on the postoperative MRI is blood products. With narrow CT windows, hyperdense blood products are visible in the ventral canal, and more circumferentially in the lower lumbar spine (e.g. L3-L4 series 202, image 65).  Small volume superficial postoperative gas in the subcutaneous fat. Subcutaneous edema. Skin staples in place.  Aortoiliac calcified atherosclerosis noted. Visible sacrum and SI joints intact.  IMPRESSION: 1. Only trace gas within the spinal canal, limited to the L1 level. Therefore, the (mostly ventral) space-occupying process seen by lumbar MRI earlier today tracking from the lumbar spine cephalad and tapering at the T7 level is blood products. 2. Postoperative changes to the posterior elements T11 through L1. Laminectomy space drain in place. 3. Moderate bilateral layering pleural effusions. Compressive atelectasis in both lungs, but more confluent left upper lobe opacity suspicious for developing pneumonia.   Electronically Signed   By: Augusto Gamble M.D.   On: 11/18/2014 14:10   Mr Lumbar Spine Wo Contrast  11/18/2014   ADDENDUM REPORT: 11/18/2014 12:58  ADDENDUM: Correction com of the first sentence of the Clinical Data section should read: "80 year old female  with acute onset severe back pain after PILATES CLASS. "   Electronically Signed   By: Augusto Gamble M.D.   On: 11/18/2014 12:58   11/18/2014   CLINICAL DATA:  80 year old female with acute onset severe back pain after pole Oddi is class. Pain radiating anteriorly in the thorax, and progressing. Subsequent progressive weakness in her lower extremities, unable to walk. Current history of atrial fibrillation on Coumadin. Initial encounter.  EXAM: MRI THORACIC AND LUMBAR SPINE WITHOUT AND WITH CONTRAST  TECHNIQUE: Multiplanar and multiecho pulse sequences of the thoracic and lumbar spine were obtained without and with intravenous contrast.  CONTRAST:  10mL MULTIHANCE GADOBENATE DIMEGLUMINE 529 MG/ML IV SOLN  COMPARISON:  CT Abdomen and Pelvis without contrast 1024 hr today, and contrast-enhanced CT Abdomen and Pelvis 05/27/2010.  FINDINGS: MR THORACIC SPINE FINDINGS  Limited sagittal imaging the cervical spine remarkable for widespread chronic disc and endplate degeneration.  Thoracic vertebral height and alignment within normal limits, aside from mild anterolisthesis at T11-T12 which is stable since 2011. Benign upper thoracic vertebral body hemangiomas are suspected including at T3 where there is mild enhancement of the lesion (series 18, image 5). No marrow edema or evidence of acute osseous abnormality.  Negative visualized posterior paraspinal soft tissues.  There is a small layering left pleural effusion. Dependent pulmonary atelectasis. Negative visualized upper abdominal viscera.  No upper thoracic spinal stenosis, however, there is abnormal upper thoracic spinal cord signal at the T4 level (series 5, image 7 and series 8, image 9). This signal appears to be located centrally and may also involve the dorsal columns. No cord expansion. No associated cord enhancement. The adjacent spinal cord segments appear within normal limits. No significant degenerative changes in this region.  Severe abnormality in the lower  thoracic spine extending from the T10 to the T12 level, with a lobulated somewhat multiloculated appearing process occupying the ventral spinal canal at these levels, encompassing 9 x 14 x 55 mm (AP by transverse by CC). The abnormality is heterogeneously hyperintense on T2 imaging, hyperintense on STIR imaging, and nearly isointense to CSF on T1 precontrast imaging. However, the lesion significantly loses signal on axial gradient echo imaging. Following contrast are several curvilinear areas of enhancement, mostly along the periphery of the lesion (series 18, image 6). Precontrast images do not suggest favor this process is extradural, but intradural location is difficult to exclude.  There is associated cord compression. The cord is posteriorly displaced and compressed, maximally at T11 (series 8, image 32). There does appear to be abnormal associated cord signal.  The most distal spinal cord and conus medullaris are unaffected and appear within normal limits. No intra-axial enhancement.  No abnormality in this region was evident on the 2011 contrast-enhanced CT Abdomen and Pelvis.  MR LUMBAR SPINE FINDINGS  Lumbar segmentation is normal. Chronic L5-S1 anterolisthesis has mildly progressed since 2011. Mild retrolisthesis at L3-L4 has progressed. There are associated degenerative changes at these levels, including advanced disc and endplate degeneration at L4-L5 were there is mild marrow edema. No acute osseous abnormality identified.  There is patchy increased STIR signal in some of the posterior paraspinal soft tissues, nonspecific.  The conus medullaris is normal at L1-L2. The space-occupying mass in the lower thoracic spine appears to terminate at the T12-L1 disc space level. Cauda equina nerve roots are within normal limits, aside from the degenerative spinal stenosis findings described below.  However, there is trace layering hemorrhage at the term in a shin of the thecal sac at the S2-S3 level (series 13,  image 6 and series 16, image 33).  Visualized abdominal viscera are stable since 2011.  The following degenerative changes are noted in the lumbar spine.  L2-L3: Lobulated disc bulge and severe facet hypertrophy. Mild spinal and L2 biforaminal stenosis.  L3-L4: Severe facet and ligament flavum hypertrophy with facet joint fluid. Broad-based disc bulge. Moderate spinal stenosis and right L3 foraminal stenosis.  L4-L5: Severe multifactorial spinal stenosis with right eccentric disc bulge and severe facet hypertrophy. Moderate right L4 foraminal stenosis.  L5-S1: Severe facet hypertrophy and degeneration with facet joint fluid. Grade 1 anterolisthesis at this level. Moderate ligament flavum hypertrophy. Broad-based disc/ pseudo disc. Mild spinal stenosis. Moderate left lateral recess stenosis. Moderate left L5 foraminal stenosis.  Visible sacrum intact.  Diverticulosis of the sigmoid colon.  IMPRESSION: 1. Lower thoracic spinal cord compression with abnormal cord signal related to a lobulated roughly 1 x 1.5 x 5.5 cm space-occupying mass in the ventral spinal canal from the T10 to the T12 level. This most resembles hemorrhage/hematoma. This could be occupying the ventral epidural space (slightly favored) or intradural, and there is trace layering hemorrhage in the caudal thecal sac at S2-S3. Legrand RamsFavor this is posttraumatic in nature rather than related to a spinal/dural AVM, however, small curvilinear areas of enhancement along the periphery of the hematoma are noted (perhaps due to active bleeding). 2.  No acute osseous abnormality identified. 3. Superimposed T4 level abnormal central and dorsal spinal cord signal, nonspecific. No degenerative changes in the region. Perhaps this is sequelae of prior transverse myelitis or less likely small cord infarct. 4. No acute findings in the lumbar spine. Moderate to severe degenerative spinal stenosis at L3-L4 and L4-L5. Critical Value/emergent results were called by telephone at  the time of interpretation on 11/16/2014 at 1836 hrs. to Dr. Judie PetitM. Pheiffer in the ED who verbally acknowledged these results.  Electronically Signed: By: Augusto GambleLee  Hall M.D. On: 11/16/2014 19:13   Mr Lumbar Spine Wo Contrast  11/18/2014   CLINICAL DATA:  80 year old female who presented with back pain and progressive lower extremity weakness, found to have T10 level spinal hematoma subsequently determine to be mostly subdural on neuro surgical decompression and evacuation. Initially significant clinical improvement in symptoms, but now with lower extremity recurrent weakness. Initial encounter.  EXAM: MRI LUMBAR SPINE WITHOUT CONTRAST  TECHNIQUE: Multiplanar, multisequence MR imaging of the lumbar spine was performed. No intravenous contrast was administered.  COMPARISON:  Thoracic and lumbar spine MRI 11/16/2014.  FINDINGS: Normal lumbar segmentation noted on the comparison. Stable lumbar vertebral height and alignment.  Posterior decompression from the T11 spinous process to the L2 spinous process. There is a small percutaneous postoperative drain in place at the laminectomy site. There is patchy T2 and STIR  hyperintensity in the decompression space, along with a small volume of fluid.  Within the spinal canal there is new ventral space-occupying material measuring up to 6 mm in thickness which is widespread from the T11 level tracking caudally in the ventral lumbar spinal canal as far as L5. Much of this is extremely dark on T2 and STIR imaging and isointense on T1. There is also a bright T2 and STIR component which is dark on T1 weighted imaging. Limited sagittal imaging of the more superior spine was performed in order to visualize the upper most extent of this finding, it tracks cephalad tapering to the T7 level. Subsequently there is posterior displacement of the spinal cord/thecal sac throughout the extent of the finding.  Despite the continued mass effect, compression on the spinal cord has regressed at  T10-T11, but there is new/increased lumbar spinal stenosis, maximal at L3-L4 and L4-L5 (severe).  In the sacral spinal canal there is increased but small volume blood products both layering in the thecal sac and adherent to the sacral nerve roots.  There may be mild residual increased T2 spinal cord signal at T10 and T11.  Stable visualized abdominal viscera.  IMPRESSION: 1. Postoperative changes from T11-L1 with postoperative drain in place and evacuation of the lobulated T10/T11 subdural hematoma. Regressed mass effect on the spinal cord at that level, perhaps mild residual T2 cord signal abnormality. 2. However, there is now increased mostly ventral spinal canal blood products tracking throughout the lumbar spine, and tapering cephalad to the T7 level. It is unclear whether some of this space-occupying material might be gas (there is a postoperative drain in place). Study discussed by telephone with Dr. Ronaldo Miyamoto CABBELL on 11/18/2014 at 12:49 . We will follow-up with CT of the thoracic and lumbar spine to evaluate postoperative gas versus blood products.   Electronically Signed   By: Augusto Gamble M.D.   On: 11/18/2014 12:56   Mr Thoracic Spine W Wo Contrast  11/18/2014   ADDENDUM REPORT: 11/18/2014 12:58  ADDENDUM: Correction com of the first sentence of the Clinical Data section should read: "80 year old female with acute onset severe back pain after PILATES CLASS. "   Electronically Signed   By: Augusto Gamble M.D.   On: 11/18/2014 12:58   11/18/2014   CLINICAL DATA:  80 year old female with acute onset severe back pain after pole Oddi is class. Pain radiating anteriorly in the thorax, and progressing. Subsequent progressive weakness in her lower extremities, unable to walk. Current history of atrial fibrillation on Coumadin. Initial encounter.  EXAM: MRI THORACIC AND LUMBAR SPINE WITHOUT AND WITH CONTRAST  TECHNIQUE: Multiplanar and multiecho pulse sequences of the thoracic and lumbar spine were obtained without and  with intravenous contrast.  CONTRAST:  10mL MULTIHANCE GADOBENATE DIMEGLUMINE 529 MG/ML IV SOLN  COMPARISON:  CT Abdomen and Pelvis without contrast 1024 hr today, and contrast-enhanced CT Abdomen and Pelvis 05/27/2010.  FINDINGS: MR THORACIC SPINE FINDINGS  Limited sagittal imaging the cervical spine remarkable for widespread chronic disc and endplate degeneration.  Thoracic vertebral height and alignment within normal limits, aside from mild anterolisthesis at T11-T12 which is stable since 2011. Benign upper thoracic vertebral body hemangiomas are suspected including at T3 where there is mild enhancement of the lesion (series 18, image 5). No marrow edema or evidence of acute osseous abnormality.  Negative visualized posterior paraspinal soft tissues.  There is a small layering left pleural effusion. Dependent pulmonary atelectasis. Negative visualized upper abdominal viscera.  No upper thoracic spinal stenosis,  however, there is abnormal upper thoracic spinal cord signal at the T4 level (series 5, image 7 and series 8, image 9). This signal appears to be located centrally and may also involve the dorsal columns. No cord expansion. No associated cord enhancement. The adjacent spinal cord segments appear within normal limits. No significant degenerative changes in this region.  Severe abnormality in the lower thoracic spine extending from the T10 to the T12 level, with a lobulated somewhat multiloculated appearing process occupying the ventral spinal canal at these levels, encompassing 9 x 14 x 55 mm (AP by transverse by CC). The abnormality is heterogeneously hyperintense on T2 imaging, hyperintense on STIR imaging, and nearly isointense to CSF on T1 precontrast imaging. However, the lesion significantly loses signal on axial gradient echo imaging. Following contrast are several curvilinear areas of enhancement, mostly along the periphery of the lesion (series 18, image 6). Precontrast images do not suggest favor  this process is extradural, but intradural location is difficult to exclude.  There is associated cord compression. The cord is posteriorly displaced and compressed, maximally at T11 (series 8, image 32). There does appear to be abnormal associated cord signal.  The most distal spinal cord and conus medullaris are unaffected and appear within normal limits. No intra-axial enhancement.  No abnormality in this region was evident on the 2011 contrast-enhanced CT Abdomen and Pelvis.  MR LUMBAR SPINE FINDINGS  Lumbar segmentation is normal. Chronic L5-S1 anterolisthesis has mildly progressed since 2011. Mild retrolisthesis at L3-L4 has progressed. There are associated degenerative changes at these levels, including advanced disc and endplate degeneration at L4-L5 were there is mild marrow edema. No acute osseous abnormality identified.  There is patchy increased STIR signal in some of the posterior paraspinal soft tissues, nonspecific.  The conus medullaris is normal at L1-L2. The space-occupying mass in the lower thoracic spine appears to terminate at the T12-L1 disc space level. Cauda equina nerve roots are within normal limits, aside from the degenerative spinal stenosis findings described below.  However, there is trace layering hemorrhage at the term in a shin of the thecal sac at the S2-S3 level (series 13, image 6 and series 16, image 33).  Visualized abdominal viscera are stable since 2011.  The following degenerative changes are noted in the lumbar spine.  L2-L3: Lobulated disc bulge and severe facet hypertrophy. Mild spinal and L2 biforaminal stenosis.  L3-L4: Severe facet and ligament flavum hypertrophy with facet joint fluid. Broad-based disc bulge. Moderate spinal stenosis and right L3 foraminal stenosis.  L4-L5: Severe multifactorial spinal stenosis with right eccentric disc bulge and severe facet hypertrophy. Moderate right L4 foraminal stenosis.  L5-S1: Severe facet hypertrophy and degeneration with facet  joint fluid. Grade 1 anterolisthesis at this level. Moderate ligament flavum hypertrophy. Broad-based disc/ pseudo disc. Mild spinal stenosis. Moderate left lateral recess stenosis. Moderate left L5 foraminal stenosis.  Visible sacrum intact.  Diverticulosis of the sigmoid colon.  IMPRESSION: 1. Lower thoracic spinal cord compression with abnormal cord signal related to a lobulated roughly 1 x 1.5 x 5.5 cm space-occupying mass in the ventral spinal canal from the T10 to the T12 level. This most resembles hemorrhage/hematoma. This could be occupying the ventral epidural space (slightly favored) or intradural, and there is trace layering hemorrhage in the caudal thecal sac at S2-S3. Legrand RamsFavor this is posttraumatic in nature rather than related to a spinal/dural AVM, however, small curvilinear areas of enhancement along the periphery of the hematoma are noted (perhaps due to active bleeding). 2.  No acute osseous abnormality identified. 3. Superimposed T4 level abnormal central and dorsal spinal cord signal, nonspecific. No degenerative changes in the region. Perhaps this is sequelae of prior transverse myelitis or less likely small cord infarct. 4. No acute findings in the lumbar spine. Moderate to severe degenerative spinal stenosis at L3-L4 and L4-L5. Critical Value/emergent results were called by telephone at the time of interpretation on 11/16/2014 at 1836 hrs. to Dr. Judie Petit. Pheiffer in the ED who verbally acknowledged these results.  Electronically Signed: By: Augusto Gamble M.D. On: 11/16/2014 19:13    PHYSICAL EXAM  patient is oriented to person time and place. Affect is normal. There are multiple family members in the room. Head is atraumatic. Sclera and conjunctiva are normal. Lungs reveal few scattered rhonchi. Cardiac exam reveals S1 and S2. She has multiple areas of ecchymoses. I have not done a neurologic exam.   TELEMETRY: I have reviewed telemetry today November 19, 2014. There is atrial flutter. The rate  ranges between 90 and 140. Currently her rate in bed at rest is 120.   ASSESSMENT AND PLAN:    Paroxysmal atrial flutter       I think she may have chronic atrial flutter as opposed to paroxysmal. EKGs in the office recently on show atrial flutter. Currently she has some increased heart rate. She does not feel this. Better rate control will be helpful. I'm starting by adding diltiazem 30 mg every 6 hours. If she tolerates this, I will decide on further dosing and change her to a once a day medication.    Hypercholesterolemia   Paraplegia at T9 level   Weakness of both legs   Willa Rough 11/19/2014 11:04 AM

## 2014-11-19 NOTE — Evaluation (Signed)
Occupational Therapy Evaluation Patient Details Name: [name] Weber MRN: [id number] DOB: 07/24/1931 Today's Date: 11/19/2014    History of Present Illness 80 yo female s/p epidural hematoma evacuation T11-12 cord compression by Dr Jeral FruitBotero. PMH: palpitations, PAC, PVS, Myalgia,    Clinical Impression   Patient is s/p epidural hematoma evacuation on 11/16/14 surgery resulting in functional limitations due to the deficits listed below (see OT problem list). PTA completely independent driving living alone with dog Hershey. Patient will benefit from skilled OT acutely to increase independence and safety with ADLS to allow discharge CIR.Family and patient requesting CIR consult and verbalized making arrangements to help her go to CIR. OT to follow acutely for dynamic standing balance with adls and education on back precautions during these tasks.     Follow Up Recommendations  CIR;Supervision/Assistance - 24 hour    Equipment Recommendations  Other (comment) (defer to venue)    Recommendations for Other Services Rehab consult     Precautions / Restrictions Precautions Precautions: Fall;Back Precaution Comments: hemovac      Mobility Bed Mobility Overal bed mobility: +2 for physical assistance;Needs Assistance Bed Mobility: Supine to Sit;Sidelying to Sit Rolling: Min assist Sidelying to sit: Min assist Supine to sit: +2 for physical assistance;Min assist     General bed mobility comments: cues for safety and sequence. Pt following commands and attempting to push with R UE  Transfers Overall transfer level: Needs assistance Equipment used: Rolling walker (2 wheeled) Transfers: Sit to/from Stand Sit to Stand: +2 physical assistance;Mod assist         General transfer comment: cues for hand placement and safety    Balance Overall balance assessment: Needs assistance Sitting-balance support: Bilateral upper extremity supported;Feet supported Sitting balance-Leahy Scale:  Fair       Standing balance-Leahy Scale: Poor Standing balance comment: pt with LOB in all directions initially                            ADL Overall ADL's : Needs assistance/impaired                         Toilet Transfer: +2 for physical assistance;Moderate assistance;Ambulation;RW Toilet Transfer Details (indicate cue type and reason): pt with scissor ataxic gait, pt with inability to control descend due to LE weakness. pt with very quick speed and unable to control         Functional mobility during ADLs: Moderate assistance;+2 for physical assistance;Rolling walker General ADL Comments: Pt very pleasant and agreeable to all therapy. Family present and with a lot of questions regarding progress, d/c recommendations, and current MD in charge of care. Rn called to room to reinforce dressing ( see below). Pt tolerated session well. Pt educated on back precautions and length of time for sitting in chair.      Vision                     Perception     Praxis      Pertinent Vitals/Pain Pain Assessment: No/denies pain     Hand Dominance Right   Extremity/Trunk Assessment Upper Extremity Assessment Upper Extremity Assessment: Overall WFL for tasks assessed   Lower Extremity Assessment Lower Extremity Assessment: Defer to PT evaluation;Generalized weakness   Cervical / Trunk Assessment Cervical / Trunk Assessment: Kyphotic   Communication Communication Communication: HOH   Cognition Arousal/Alertness: Awake/alert Behavior During Therapy: WFL for tasks assessed/performed Overall Cognitive  Status: Impaired/Different from baseline Area of Impairment: Memory;Awareness;Problem solving     Memory: Decreased short-term memory     Awareness: Emergent Problem Solving: Difficulty sequencing General Comments: Pt reports "honey I have been confused. i thought we already missed Christmas" Pt oriented to floor, date, situation and plans of care.  Pt able to follow commands and very eager to complete therapy session. Family reports "she will do exactly what you tell her so you have to tell her how much too"   General Comments       Exercises       Shoulder Instructions      Home Living Family/patient expects to be discharged to:: Skilled nursing facility Living Arrangements: Alone                                      Prior Functioning/Environment Level of Independence: Independent             OT Diagnosis: Generalized weakness;Acute pain   OT Problem List: Decreased strength;Decreased activity tolerance;Impaired balance (sitting and/or standing);Decreased safety awareness;Decreased knowledge of use of DME or AE;Decreased knowledge of precautions;Pain   OT Treatment/Interventions: Self-care/ADL training;Therapeutic exercise;DME and/or AE instruction;Therapeutic activities;Cognitive remediation/compensation;Visual/perceptual remediation/compensation;Patient/family education;Balance training    OT Goals(Current goals can be found in the care plan section) Acute Rehab OT Goals Patient Stated Goal: to get back to doing for myself - return home to dog "hersey" OT Goal Formulation: With patient/family Time For Goal Achievement: 12/03/14 Potential to Achieve Goals: Good  OT Frequency: Min 2X/week   Barriers to D/C:    family to arrange assistance to allow CIR admission if approved       Co-evaluation              End of Session Equipment Utilized During Treatment: Gait belt;Rolling walker Nurse Communication: Mobility status;Precautions  Activity Tolerance: Patient tolerated treatment well Patient left: in chair;with call bell/phone within reach;with chair alarm set;with family/visitor present   Time: [id number] OT Time Calculation (min): 35 min Charges:  OT General Charges $OT Visit: 1 Procedure OT Evaluation $Initial OT Evaluation Tier I: 1 Procedure OT Treatments $Self Care/Home  Management : 23-37 mins G-Codes:    Harolyn RutherfordJones, Kaisei Gilbo B 11/19/2014, 3:10 PM  Pager: [phone]

## 2014-11-19 NOTE — Clinical Social Work Placement (Signed)
Clinical Social Work Department CLINICAL SOCIAL WORK PLACEMENT NOTE 11/19/2014  Patient:  [name],[name]  Account Number:  [id number] Admit date:  11/16/2014  Clinical Social Worker:  Gwynne EdingerYSHEKA Eily Louvier, LCSWA  Date/time:  11/19/2014 09:27 AM  Clinical Social Work is seeking post-discharge placement for this patient at the following level of care:   SKILLED NURSING   (*CSW will update this form in Epic as items are completed)   11/18/2014  [facility]/family provided with [facility] Work's list of facilities offering this level of care within the geographic area requested by the patient (or if unable, by the patient's family).  11/18/2014  Patient/family informed of their freedom to choose among providers that offer the needed level of care, that participate in Medicare, Medicaid or managed care program needed by the patient, have an available bed and are willing to accept the patient.  11/18/2014  Patient/family informed of [facility]' ownership interest in [facility], as well as of the fact that they are under no obligation to receive care at this facility.  PASARR submitted to EDS on 11/19/2014 PASARR number received on 11/19/2014  FL2 transmitted to all facilities in geographic area requested by pt/family on  11/19/2014 FL2 transmitted to all facilities within larger geographic area on 11/19/2014  Patient informed that his/her managed care company has contracts with or will negotiate with  certain facilities, including the following:     Patient/family informed of bed offers received:   Patient chooses bed at  Physician recommends and patient chooses bed at    Patient to be transferred to  on   Patient to be transferred to facility by  Patient and family notified of transfer on  Name of family member notified:    The following physician request were entered in Epic:   Additional Comments:  Airlie Blumenberg, MSW, LCSWA [phone]

## 2014-11-19 NOTE — Progress Notes (Signed)
I met with pt, her niece, her grand daughter, and friends at bedside. I clarified caregiver support at home. She does not have 24/7 supervision at home. Niece will say she can help and then say she cannot live with pt and care for the three dogs they have between them.Pt now agreeable to [facility] SNF her first choice, and and then [facility] her second choice. Please call me with any questions. [phone]

## 2014-11-19 NOTE — Progress Notes (Signed)
Patient ID: [name] Weber, female   DOB: 12/26/1930, 80 y.o.   MRN: [id number] Much better. Moves both legs well. Spoke at length with family.

## 2014-11-20 ENCOUNTER — Telehealth: Payer: Self-pay | Admitting: Cardiology

## 2014-11-20 MED ORDER — DILTIAZEM HCL ER COATED BEADS 120 MG PO CP24
120.0000 mg | ORAL_CAPSULE | Freq: Every day | ORAL | Status: DC
  Filled 2014-11-20 (×2): qty 1

## 2014-11-20 MED ORDER — BISACODYL 10 MG RE SUPP
10.0000 mg | Freq: Every morning | RECTAL | Status: DC
  Administered 2014-11-22 – 2014-11-28 (×7): 10 mg via RECTAL
  Filled 2014-11-20 (×7): qty 1

## 2014-11-20 NOTE — Progress Notes (Signed)
Patient ID: [name] Weber, female   DOB: 10/30/1931, 80 y.o.   MRN: [id number] Fainted while walking with pt. Had a bm last night. Plan dc foley, drains. To SNF once she is stable

## 2014-11-20 NOTE — Progress Notes (Signed)
OT NOTE  OT treatment note to follow  Family very much wants CIR for d/c planning. Sister Aurea GraffJoan [facility] present and [facility]. Please continue to follow recommendation for CIR.  Orthostatic BPs     Sitting in chair on arrival 96/68  Sitting after 5 min ace wraps   Standing ace wraps s/p bowel movement 78/44  In chair with feet elevated 100/56    Mateo FlowJones, Brynn   OTR/L Pager: [phone] Office: [phone] .

## 2014-11-20 NOTE — Progress Notes (Signed)
Patient ID: [name] Weber, female   DOB: [birth year], 80 y.o.   MRN: [id number]    SUBJECTIVE:  Yesterday I started low dose diltiazem for her atrial flutter. I had stated that I thought she might have chronic atrial flutter. However it appears that she is converted to sinus rhythm with a rate of 70. She is tolerating the diltiazem.   Filed Vitals:   11/19/14 2147 11/20/14 0136 11/20/14 0625 11/20/14 1006  BP: 121/70 91/49 106/59 105/54  Pulse: 84 72 75 107  Temp: 97.8 F (36.6 C) 97.8 F (36.6 C) 97.5 F (36.4 C) 98 F (36.7 C)  TempSrc: Oral Oral Oral Oral  Resp: 18 16 16 16   Height:      Weight:      SpO2: 98% 98% 98% 97%     Intake/Output Summary (Last 24 hours) at 11/20/14 1017 Last data filed at 11/20/14 0423  Gross per 24 hour  Intake      0 ml  Output   2375 ml  Net  -2375 ml    LABS: Basic Metabolic Panel:  Recent Labs  40/98/11 0540  NA 139  K 4.4  CL 100  CO2 28  GLUCOSE 131*  BUN 18  CREATININE 0.52  CALCIUM 9.0   Liver Function Tests: No results for input(s): AST, ALT, ALKPHOS, BILITOT, PROT, ALBUMIN in the last 72 hours. No results for input(s): LIPASE, AMYLASE in the last 72 hours. CBC:  Recent Labs  11/19/14 0540  WBC 11.8*  HGB 13.1  HCT 39.8  MCV 97.1  PLT 146*   Cardiac Enzymes: No results for input(s): CKTOTAL, CKMB, CKMBINDEX, TROPONINI in the last 72 hours. BNP: Invalid input(s): POCBNP D-Dimer: No results for input(s): DDIMER in the last 72 hours. Hemoglobin A1C: No results for input(s): HGBA1C in the last 72 hours. Fasting Lipid Panel: No results for input(s): CHOL, HDL, LDLCALC, TRIG, CHOLHDL, LDLDIRECT in the last 72 hours. Thyroid Function Tests: No results for input(s): TSH, T4TOTAL, T3FREE, THYROIDAB in the last 72 hours.  Invalid input(s): FREET3  RADIOLOGY: Ct Abdomen Pelvis Wo Contrast  11/16/2014   CLINICAL DATA:  Right flank pain for 1 day with nausea and vomiting. Initial encounter.  EXAM: CT ABDOMEN AND  PELVIS WITHOUT CONTRAST  TECHNIQUE: Multidetector CT imaging of the abdomen and pelvis was performed following the standard protocol without IV contrast.  COMPARISON:  05/27/2010 abdominal pelvic CT.  FINDINGS: Lower chest: Clear lung bases. No significant pleural or pericardial effusion.  Hepatobiliary: As evaluated in the noncontrast state, the liver appears normal without focal abnormality. No evidence of gallstones, gallbladder wall thickening or biliary dilatation.  Pancreas: Unremarkable. No pancreatic ductal dilatation or surrounding inflammatory changes.  Spleen: Normal in size without focal abnormality.  Adrenals/Urinary Tract: Both adrenal glands appear normal.There is no evidence of urinary tract calculus or hydronephrosis. This is small parenchymal calcification in the upper pole of the left kidney. A simple cyst posteriorly in the left kidney appears stable. The distal ureters are not well visualized, but no ureteral or bladder calculi are seen.  Stomach/Bowel: No evidence of bowel wall thickening, distention or surrounding inflammatory change.Mild distal colonic diverticular changes are present.  Vascular/Lymphatic: There are no enlarged abdominal or pelvic lymph nodes. There is diffuse aortoiliac atherosclerosis.  Reproductive: Status post hysterectomy. No evidence of adnexal mass.  Other: No evidence of abdominal wall mass or hernia.  Musculoskeletal: No acute or significant osseous findings. There are degenerative changes throughout the lumbar spine associated with a convex  left scoliosis.  IMPRESSION: IMPRESSION 1. No evidence of urinary tract calculus or hydronephrosis. 2. No acute findings identified. 3. Diffuse atherosclerosis and mild distal colonic diverticulosis noted.   Electronically Signed   By: Roxy HorsemanBill  Veazey M.D.   On: 11/16/2014 10:43   Dg Chest 2 View  11/13/2014   CLINICAL DATA:  Recent injury while working out with difficulty breathing and right-sided chest pain  EXAM: CHEST  2  VIEW  COMPARISON:  None.  FINDINGS: Cardiac shadow is within normal limits. The lungs are hyperinflated consistent with COPD. No pneumothorax or sizable effusion is seen. No focal infiltrate is noted.  IMPRESSION: COPD without acute abnormality.   Electronically Signed   By: Alcide CleverMark  Lukens M.D.   On: 11/13/2014 12:09   Ct Thoracic Spine Wo Contrast  11/18/2014   CLINICAL DATA:  80 year old female who presented with back pain and progressive lower extremity weakness, found to have T10 level spinal hematoma subsequently determined to be mostly subdural on neurosurgical decompression and evacuation. Initially significantclinical improvement in symptoms, but now with lower extremity recurrent weakness. Postoperative lumbar MRI with increased ventral spinal, but possibly superimposed on a significant amount of intraspinal gas. Further evaluation of that finding with CT. Initial encounter.  EXAM: CT THORACIC AND LUMBAR SPINE WITHOUT CONTRAST  TECHNIQUE: Multidetector CT imaging of the thoracic and lumbar spine was performed without contrast. Multiplanar CT image reconstructions were also generated.  COMPARISON:  Postoperative lumbar MRI today at 1138 hrs. Preoperative thoracic and lumbar MRI 11/16/2014.  FINDINGS: CT THORACIC SPINE FINDINGS  Posterior element postoperative changes from the T11 spinous process continuing into the upper lumbar spine. There is a percutaneous postoperative drain in place, extending deep into the laminectomy space and terminating along the left L2 spinous process. There is a small volume of superficial and laminectomy space postoperative gas.  With regard to the dark T2/stir signal seen along the ventral spinal canal in the lower thoracic and lumbar spine, there is only a small volume of gas present in the ventral canal posterior to the L1 vertebral body, and at the T12-L1 disc space. There is vacuum disc phenomena at T12-L1. No other gas within the thoracic spinal canal. With narrow CT  windows, the ventral spinal canal density is seen to be hyperdense to CSF compatible with blood products (see series 207, image 19 and axial images 122 and 153 of series 202). As noted on the recent MRI this tapers cephalad to the T7 level.  Moderate size layering bilateral pleural effusions. Compressive atelectasis in both lungs, but more confluent left upper lobe opacity (series 201, image 43).  Calcified atherosclerosis of the thoracic aorta.  CT LUMBAR SPINE FINDINGS  Stable lumbar vertebral height and alignment.  Postoperative changes to the posterior elements from the lower thoracic spine continuing to the L1 lamina. Postoperative drain in place as detailed above.  As stated above there is only a small volume of gas in the ventral spinal canal at the L1 level, and also along the right lateral aspect of the right L1-L2 facet. Therefore, the bulk of the space-occupying process seen on the postoperative MRI is blood products. With narrow CT windows, hyperdense blood products are visible in the ventral canal, and more circumferentially in the lower lumbar spine (e.g. L3-L4 series 202, image 65).  Small volume superficial postoperative gas in the subcutaneous fat. Subcutaneous edema. Skin staples in place.  Aortoiliac calcified atherosclerosis noted. Visible sacrum and SI joints intact.  IMPRESSION: 1. Only trace gas within the spinal  canal, limited to the L1 level. Therefore, the (mostly ventral) space-occupying process seen by lumbar MRI earlier today tracking from the lumbar spine cephalad and tapering at the T7 level is blood products. 2. Postoperative changes to the posterior elements T11 through L1. Laminectomy space drain in place. 3. Moderate bilateral layering pleural effusions. Compressive atelectasis in both lungs, but more confluent left upper lobe opacity suspicious for developing pneumonia.   Electronically Signed   By: Augusto Gamble M.D.   On: 11/18/2014 14:10   Ct Lumbar Spine Wo  Contrast  11/18/2014   CLINICAL DATA:  80 year old female who presented with back pain and progressive lower extremity weakness, found to have T10 level spinal hematoma subsequently determined to be mostly subdural on neurosurgical decompression and evacuation. Initially significantclinical improvement in symptoms, but now with lower extremity recurrent weakness. Postoperative lumbar MRI with increased ventral spinal, but possibly superimposed on a significant amount of intraspinal gas. Further evaluation of that finding with CT. Initial encounter.  EXAM: CT THORACIC AND LUMBAR SPINE WITHOUT CONTRAST  TECHNIQUE: Multidetector CT imaging of the thoracic and lumbar spine was performed without contrast. Multiplanar CT image reconstructions were also generated.  COMPARISON:  Postoperative lumbar MRI today at 1138 hrs. Preoperative thoracic and lumbar MRI 11/16/2014.  FINDINGS: CT THORACIC SPINE FINDINGS  Posterior element postoperative changes from the T11 spinous process continuing into the upper lumbar spine. There is a percutaneous postoperative drain in place, extending deep into the laminectomy space and terminating along the left L2 spinous process. There is a small volume of superficial and laminectomy space postoperative gas.  With regard to the dark T2/stir signal seen along the ventral spinal canal in the lower thoracic and lumbar spine, there is only a small volume of gas present in the ventral canal posterior to the L1 vertebral body, and at the T12-L1 disc space. There is vacuum disc phenomena at T12-L1. No other gas within the thoracic spinal canal. With narrow CT windows, the ventral spinal canal density is seen to be hyperdense to CSF compatible with blood products (see series 207, image 19 and axial images 122 and 153 of series 202). As noted on the recent MRI this tapers cephalad to the T7 level.  Moderate size layering bilateral pleural effusions. Compressive atelectasis in both lungs, but more  confluent left upper lobe opacity (series 201, image 43).  Calcified atherosclerosis of the thoracic aorta.  CT LUMBAR SPINE FINDINGS  Stable lumbar vertebral height and alignment.  Postoperative changes to the posterior elements from the lower thoracic spine continuing to the L1 lamina. Postoperative drain in place as detailed above.  As stated above there is only a small volume of gas in the ventral spinal canal at the L1 level, and also along the right lateral aspect of the right L1-L2 facet. Therefore, the bulk of the space-occupying process seen on the postoperative MRI is blood products. With narrow CT windows, hyperdense blood products are visible in the ventral canal, and more circumferentially in the lower lumbar spine (e.g. L3-L4 series 202, image 65).  Small volume superficial postoperative gas in the subcutaneous fat. Subcutaneous edema. Skin staples in place.  Aortoiliac calcified atherosclerosis noted. Visible sacrum and SI joints intact.  IMPRESSION: 1. Only trace gas within the spinal canal, limited to the L1 level. Therefore, the (mostly ventral) space-occupying process seen by lumbar MRI earlier today tracking from the lumbar spine cephalad and tapering at the T7 level is blood products. 2. Postoperative changes to the posterior elements T11 through  L1. Laminectomy space drain in place. 3. Moderate bilateral layering pleural effusions. Compressive atelectasis in both lungs, but more confluent left upper lobe opacity suspicious for developing pneumonia.   Electronically Signed   By: Augusto Gamble M.D.   On: 11/18/2014 14:10   Mr Lumbar Spine Wo Contrast  11/18/2014   ADDENDUM REPORT: 11/18/2014 12:58  ADDENDUM: Correction com of the first sentence of the Clinical Data section should read: "80 year old female with acute onset severe back pain after PILATES CLASS. "   Electronically Signed   By: Augusto Gamble M.D.   On: 11/18/2014 12:58   11/18/2014   CLINICAL DATA:  80 year old female with acute onset  severe back pain after pole Oddi is class. Pain radiating anteriorly in the thorax, and progressing. Subsequent progressive weakness in her lower extremities, unable to walk. Current history of atrial fibrillation on Coumadin. Initial encounter.  EXAM: MRI THORACIC AND LUMBAR SPINE WITHOUT AND WITH CONTRAST  TECHNIQUE: Multiplanar and multiecho pulse sequences of the thoracic and lumbar spine were obtained without and with intravenous contrast.  CONTRAST:  10mL MULTIHANCE GADOBENATE DIMEGLUMINE 529 MG/ML IV SOLN  COMPARISON:  CT Abdomen and Pelvis without contrast 1024 hr today, and contrast-enhanced CT Abdomen and Pelvis 05/27/2010.  FINDINGS: MR THORACIC SPINE FINDINGS  Limited sagittal imaging the cervical spine remarkable for widespread chronic disc and endplate degeneration.  Thoracic vertebral height and alignment within normal limits, aside from mild anterolisthesis at T11-T12 which is stable since 2011. Benign upper thoracic vertebral body hemangiomas are suspected including at T3 where there is mild enhancement of the lesion (series 18, image 5). No marrow edema or evidence of acute osseous abnormality.  Negative visualized posterior paraspinal soft tissues.  There is a small layering left pleural effusion. Dependent pulmonary atelectasis. Negative visualized upper abdominal viscera.  No upper thoracic spinal stenosis, however, there is abnormal upper thoracic spinal cord signal at the T4 level (series 5, image 7 and series 8, image 9). This signal appears to be located centrally and may also involve the dorsal columns. No cord expansion. No associated cord enhancement. The adjacent spinal cord segments appear within normal limits. No significant degenerative changes in this region.  Severe abnormality in the lower thoracic spine extending from the T10 to the T12 level, with a lobulated somewhat multiloculated appearing process occupying the ventral spinal canal at these levels, encompassing 9 x 14 x 55 mm  (AP by transverse by CC). The abnormality is heterogeneously hyperintense on T2 imaging, hyperintense on STIR imaging, and nearly isointense to CSF on T1 precontrast imaging. However, the lesion significantly loses signal on axial gradient echo imaging. Following contrast are several curvilinear areas of enhancement, mostly along the periphery of the lesion (series 18, image 6). Precontrast images do not suggest favor this process is extradural, but intradural location is difficult to exclude.  There is associated cord compression. The cord is posteriorly displaced and compressed, maximally at T11 (series 8, image 32). There does appear to be abnormal associated cord signal.  The most distal spinal cord and conus medullaris are unaffected and appear within normal limits. No intra-axial enhancement.  No abnormality in this region was evident on the 2011 contrast-enhanced CT Abdomen and Pelvis.  MR LUMBAR SPINE FINDINGS  Lumbar segmentation is normal. Chronic L5-S1 anterolisthesis has mildly progressed since 2011. Mild retrolisthesis at L3-L4 has progressed. There are associated degenerative changes at these levels, including advanced disc and endplate degeneration at L4-L5 were there is mild marrow edema. No acute osseous abnormality  identified.  There is patchy increased STIR signal in some of the posterior paraspinal soft tissues, nonspecific.  The conus medullaris is normal at L1-L2. The space-occupying mass in the lower thoracic spine appears to terminate at the T12-L1 disc space level. Cauda equina nerve roots are within normal limits, aside from the degenerative spinal stenosis findings described below.  However, there is trace layering hemorrhage at the term in a shin of the thecal sac at the S2-S3 level (series 13, image 6 and series 16, image 33).  Visualized abdominal viscera are stable since 2011.  The following degenerative changes are noted in the lumbar spine.  L2-L3: Lobulated disc bulge and severe  facet hypertrophy. Mild spinal and L2 biforaminal stenosis.  L3-L4: Severe facet and ligament flavum hypertrophy with facet joint fluid. Broad-based disc bulge. Moderate spinal stenosis and right L3 foraminal stenosis.  L4-L5: Severe multifactorial spinal stenosis with right eccentric disc bulge and severe facet hypertrophy. Moderate right L4 foraminal stenosis.  L5-S1: Severe facet hypertrophy and degeneration with facet joint fluid. Grade 1 anterolisthesis at this level. Moderate ligament flavum hypertrophy. Broad-based disc/ pseudo disc. Mild spinal stenosis. Moderate left lateral recess stenosis. Moderate left L5 foraminal stenosis.  Visible sacrum intact.  Diverticulosis of the sigmoid colon.  IMPRESSION: 1. Lower thoracic spinal cord compression with abnormal cord signal related to a lobulated roughly 1 x 1.5 x 5.5 cm space-occupying mass in the ventral spinal canal from the T10 to the T12 level. This most resembles hemorrhage/hematoma. This could be occupying the ventral epidural space (slightly favored) or intradural, and there is trace layering hemorrhage in the caudal thecal sac at S2-S3. Legrand Rams this is posttraumatic in nature rather than related to a spinal/dural AVM, however, small curvilinear areas of enhancement along the periphery of the hematoma are noted (perhaps due to active bleeding). 2.  No acute osseous abnormality identified. 3. Superimposed T4 level abnormal central and dorsal spinal cord signal, nonspecific. No degenerative changes in the region. Perhaps this is sequelae of prior transverse myelitis or less likely small cord infarct. 4. No acute findings in the lumbar spine. Moderate to severe degenerative spinal stenosis at L3-L4 and L4-L5. Critical Value/emergent results were called by telephone at the time of interpretation on 11/16/2014 at 1836 hrs. to Dr. Judie Petit. Pheiffer in the ED who verbally acknowledged these results.  Electronically Signed: By: Augusto Gamble M.D. On: 11/16/2014 19:13   Mr  Lumbar Spine Wo Contrast  11/18/2014   CLINICAL DATA:  80 year old female who presented with back pain and progressive lower extremity weakness, found to have T10 level spinal hematoma subsequently determine to be mostly subdural on neuro surgical decompression and evacuation. Initially significant clinical improvement in symptoms, but now with lower extremity recurrent weakness. Initial encounter.  EXAM: MRI LUMBAR SPINE WITHOUT CONTRAST  TECHNIQUE: Multiplanar, multisequence MR imaging of the lumbar spine was performed. No intravenous contrast was administered.  COMPARISON:  Thoracic and lumbar spine MRI 11/16/2014.  FINDINGS: Normal lumbar segmentation noted on the comparison. Stable lumbar vertebral height and alignment.  Posterior decompression from the T11 spinous process to the L2 spinous process. There is a small percutaneous postoperative drain in place at the laminectomy site. There is patchy T2 and STIR hyperintensity in the decompression space, along with a small volume of fluid.  Within the spinal canal there is new ventral space-occupying material measuring up to 6 mm in thickness which is widespread from the T11 level tracking caudally in the ventral lumbar spinal canal as far as L5.  Much of this is extremely dark on T2 and STIR imaging and isointense on T1. There is also a bright T2 and STIR component which is dark on T1 weighted imaging. Limited sagittal imaging of the more superior spine was performed in order to visualize the upper most extent of this finding, it tracks cephalad tapering to the T7 level. Subsequently there is posterior displacement of the spinal cord/thecal sac throughout the extent of the finding.  Despite the continued mass effect, compression on the spinal cord has regressed at T10-T11, but there is new/increased lumbar spinal stenosis, maximal at L3-L4 and L4-L5 (severe).  In the sacral spinal canal there is increased but small volume blood products both layering in the  thecal sac and adherent to the sacral nerve roots.  There may be mild residual increased T2 spinal cord signal at T10 and T11.  Stable visualized abdominal viscera.  IMPRESSION: 1. Postoperative changes from T11-L1 with postoperative drain in place and evacuation of the lobulated T10/T11 subdural hematoma. Regressed mass effect on the spinal cord at that level, perhaps mild residual T2 cord signal abnormality. 2. However, there is now increased mostly ventral spinal canal blood products tracking throughout the lumbar spine, and tapering cephalad to the T7 level. It is unclear whether some of this space-occupying material might be gas (there is a postoperative drain in place). Study discussed by telephone with Dr. Ronaldo Miyamoto CABBELL on 11/18/2014 at 12:49 . We will follow-up with CT of the thoracic and lumbar spine to evaluate postoperative gas versus blood products.   Electronically Signed   By: Augusto Gamble M.D.   On: 11/18/2014 12:56   Mr Thoracic Spine W Wo Contrast  11/18/2014   ADDENDUM REPORT: 11/18/2014 12:58  ADDENDUM: Correction com of the first sentence of the Clinical Data section should read: "80 year old female with acute onset severe back pain after PILATES CLASS. "   Electronically Signed   By: Augusto Gamble M.D.   On: 11/18/2014 12:58   11/18/2014   CLINICAL DATA:  80 year old female with acute onset severe back pain after pole Oddi is class. Pain radiating anteriorly in the thorax, and progressing. Subsequent progressive weakness in her lower extremities, unable to walk. Current history of atrial fibrillation on Coumadin. Initial encounter.  EXAM: MRI THORACIC AND LUMBAR SPINE WITHOUT AND WITH CONTRAST  TECHNIQUE: Multiplanar and multiecho pulse sequences of the thoracic and lumbar spine were obtained without and with intravenous contrast.  CONTRAST:  10mL MULTIHANCE GADOBENATE DIMEGLUMINE 529 MG/ML IV SOLN  COMPARISON:  CT Abdomen and Pelvis without contrast 1024 hr today, and contrast-enhanced CT Abdomen  and Pelvis 05/27/2010.  FINDINGS: MR THORACIC SPINE FINDINGS  Limited sagittal imaging the cervical spine remarkable for widespread chronic disc and endplate degeneration.  Thoracic vertebral height and alignment within normal limits, aside from mild anterolisthesis at T11-T12 which is stable since 2011. Benign upper thoracic vertebral body hemangiomas are suspected including at T3 where there is mild enhancement of the lesion (series 18, image 5). No marrow edema or evidence of acute osseous abnormality.  Negative visualized posterior paraspinal soft tissues.  There is a small layering left pleural effusion. Dependent pulmonary atelectasis. Negative visualized upper abdominal viscera.  No upper thoracic spinal stenosis, however, there is abnormal upper thoracic spinal cord signal at the T4 level (series 5, image 7 and series 8, image 9). This signal appears to be located centrally and may also involve the dorsal columns. No cord expansion. No associated cord enhancement. The adjacent spinal cord segments appear  within normal limits. No significant degenerative changes in this region.  Severe abnormality in the lower thoracic spine extending from the T10 to the T12 level, with a lobulated somewhat multiloculated appearing process occupying the ventral spinal canal at these levels, encompassing 9 x 14 x 55 mm (AP by transverse by CC). The abnormality is heterogeneously hyperintense on T2 imaging, hyperintense on STIR imaging, and nearly isointense to CSF on T1 precontrast imaging. However, the lesion significantly loses signal on axial gradient echo imaging. Following contrast are several curvilinear areas of enhancement, mostly along the periphery of the lesion (series 18, image 6). Precontrast images do not suggest favor this process is extradural, but intradural location is difficult to exclude.  There is associated cord compression. The cord is posteriorly displaced and compressed, maximally at T11 (series 8,  image 32). There does appear to be abnormal associated cord signal.  The most distal spinal cord and conus medullaris are unaffected and appear within normal limits. No intra-axial enhancement.  No abnormality in this region was evident on the 2011 contrast-enhanced CT Abdomen and Pelvis.  MR LUMBAR SPINE FINDINGS  Lumbar segmentation is normal. Chronic L5-S1 anterolisthesis has mildly progressed since 2011. Mild retrolisthesis at L3-L4 has progressed. There are associated degenerative changes at these levels, including advanced disc and endplate degeneration at L4-L5 were there is mild marrow edema. No acute osseous abnormality identified.  There is patchy increased STIR signal in some of the posterior paraspinal soft tissues, nonspecific.  The conus medullaris is normal at L1-L2. The space-occupying mass in the lower thoracic spine appears to terminate at the T12-L1 disc space level. Cauda equina nerve roots are within normal limits, aside from the degenerative spinal stenosis findings described below.  However, there is trace layering hemorrhage at the term in a shin of the thecal sac at the S2-S3 level (series 13, image 6 and series 16, image 33).  Visualized abdominal viscera are stable since 2011.  The following degenerative changes are noted in the lumbar spine.  L2-L3: Lobulated disc bulge and severe facet hypertrophy. Mild spinal and L2 biforaminal stenosis.  L3-L4: Severe facet and ligament flavum hypertrophy with facet joint fluid. Broad-based disc bulge. Moderate spinal stenosis and right L3 foraminal stenosis.  L4-L5: Severe multifactorial spinal stenosis with right eccentric disc bulge and severe facet hypertrophy. Moderate right L4 foraminal stenosis.  L5-S1: Severe facet hypertrophy and degeneration with facet joint fluid. Grade 1 anterolisthesis at this level. Moderate ligament flavum hypertrophy. Broad-based disc/ pseudo disc. Mild spinal stenosis. Moderate left lateral recess stenosis. Moderate  left L5 foraminal stenosis.  Visible sacrum intact.  Diverticulosis of the sigmoid colon.  IMPRESSION: 1. Lower thoracic spinal cord compression with abnormal cord signal related to a lobulated roughly 1 x 1.5 x 5.5 cm space-occupying mass in the ventral spinal canal from the T10 to the T12 level. This most resembles hemorrhage/hematoma. This could be occupying the ventral epidural space (slightly favored) or intradural, and there is trace layering hemorrhage in the caudal thecal sac at S2-S3. Legrand Rams this is posttraumatic in nature rather than related to a spinal/dural AVM, however, small curvilinear areas of enhancement along the periphery of the hematoma are noted (perhaps due to active bleeding). 2.  No acute osseous abnormality identified. 3. Superimposed T4 level abnormal central and dorsal spinal cord signal, nonspecific. No degenerative changes in the region. Perhaps this is sequelae of prior transverse myelitis or less likely small cord infarct. 4. No acute findings in the lumbar spine. Moderate to severe degenerative  spinal stenosis at L3-L4 and L4-L5. Critical Value/emergent results were called by telephone at the time of interpretation on 11/16/2014 at 1836 hrs. to Dr. Judie PetitM. Pheiffer in the ED who verbally acknowledged these results.  Electronically Signed: By: Augusto GambleLee  Hall M.D. On: 11/16/2014 19:13    PHYSICAL EXAM  patient is stable this morning. She is oriented to person time and place. Affect is normal. She is thin. Head is atraumatic. Sclera and conjunctiva are normal. Lungs are clear. Respiratory effort is nonlabored. Cardiac exam reveals an S1 and S2. Abdomen is soft. There is no peripheral edema.   TELEMETRY: I have reviewed telemetry today November 20, 2014. Her rhythm now appears to be sinus.   ASSESSMENT AND PLAN:     Paroxysmal atrial flutter     She has converted to sinus rhythm. I will change her diltiazem to a once a day dosing.    Hypercholesterolemia   Paraplegia at T9 level    Weakness of both legs   [name] Weber 11/20/2014 10:17 AM

## 2014-11-20 NOTE — Progress Notes (Addendum)
Occupational Therapy Treatment  Patient Details Name: Stephani Policevelyn C Brunelle MRN: [id number] DOB: 11/14/1931 Today's Date: 11/20/2014  Delayed entry of treatment ( however progress note placed with family request and orthostatic BP immediately following session)    History of present illness 80 yo female s/p epidural hematoma evacuation T11-12 cord compression by Dr Jeral FruitBotero. PMH: palpitations, PAC, PVS, Myalgia,    OT comments  Pt currently with orthostatic BP ( see progress note) during session limiting progress. Pts family very concerned and wanting to speak with MD. OT recommending CIR and family directly asking for this recommendation. Family does not want SNF if CIR available and approved. Family can arrange 24/7 (A). OT recommending ted hose and mesh underwear during session. Pt with incontinence of bowel and lack of awareness to incontinence.  INCONTINENCE IS NEW   Follow Up Recommendations  CIR    Equipment Recommendations  Other (comment) (defer)    Recommendations for Other Services Rehab consult    Precautions / Restrictions Precautions Precautions: Fall;Back Precaution Comments: hemovac, orthostatic BP       Mobility Bed Mobility               General bed mobility comments: in chair on arrival and end of session for BIL LE elevation  Transfers Overall transfer level: Needs assistance Equipment used: 2 person hand held assist Transfers: Sit to/from Stand;Stand Pivot Transfers Sit to Stand: +2 physical assistance;Mod assist Stand pivot transfers: +2 physical assistance;Mod assist       General transfer comment: ataxic bil LE movement and incontinence of bowel    Balance Overall balance assessment: Needs assistance         Standing balance support: Bilateral upper extremity supported;During functional activity Standing balance-Leahy Scale: Poor                     ADL Overall ADL's : Needs assistance/impaired                          Toilet Transfer: +2 for physical assistance;Moderate assistance Toilet Transfer Details (indicate cue type and reason): required ace wraps on bil LE and SCD applied to due orthostatic BP Toileting- Clothing Manipulation and Hygiene: Total assistance (total (A) for peri care and mod (A) for static standing ) Toileting - Clothing Manipulation Details (indicate cue type and reason): Pt static standing with OT and tech completing peri care. pt orthostatic and required return to sitting with ankle pumps, SCD and ace wraps prior to return to chair        General ADL Comments: Pt sitting in chair on arrival and requesting void of bowel. pt with 6 family members present at the time. Famly asked to exit the room so treatment could be provided. Pt incontinent of bowel. Pt sitting on 3n1 to void bowel. Pt reports inability to know when she is voiding bowel. Son reports "this is the second time today"  Pt with hemovac recharged during session. Pt return to chair after orthostatic event. RN called to room and notified need for ted hose. Tech educated on stand pivot transfers only and to keep SCD on patient at this time. Family with lots of questions regarding recommendations. Family informed that CIR has been consulted and if beds are full then referral to other locations may need to be required. SNf would also be pursued as a back up plan. OT made it clear that CIR has not accepted patient and pt will be discharged only  when Dr Jeral FruitBotero determines. POA  Aurea GraffJoan (sister) states "good so she will be here for several more days" OT redirected and clarified "Discharge would happen when Dr Jeral FruitBotero decides she is medically ready to leave. Currently she is unable to control her BP and she still has a hemovac. So discharge will not occur today but OT is not a will to determine this and that it could happen sooner than several days. That is a discussion to have with the doctor. OT can only take recommendations are for CIR and our  session was limited by BP orthostatic" Family expressed understanding and asking to speak to doctor at this time. OT obtained POA Aurea GraffJoan number and provided to RN Joni present for discussion. Family informed MD was not currently available and phone call would take time. RN Joni to address this concern.      Vision                     Perception     Praxis      Cognition   Behavior During Therapy: [facility] for tasks assessed/performed Overall Cognitive Status: Within Functional Limits for tasks assessed                  General Comments: no cognitive deficits noted during this session. pt reporting accurately    Extremity/Trunk Assessment               Exercises     Shoulder Instructions       General Comments      Pertinent Vitals/ Pain       Pain Assessment: 0-10  Home Living                                          Prior Functioning/Environment              Frequency Min 2X/week     Progress Toward Goals  OT Goals(current goals can now be found in the care plan section)  Progress towards OT goals: Not progressing toward goals - comment (orthostatic BP)  Acute Rehab OT Goals Patient Stated Goal: to get back to doing for myself - return home to dog "hersey" OT Goal Formulation: With patient/family Time For Goal Achievement: 12/03/14 Potential to Achieve Goals: Good ADL Goals Pt Will Perform Grooming: sitting;with supervision Pt Will Perform Upper Body Bathing: with min guard assist;sitting;with adaptive equipment Pt Will Perform Lower Body Bathing: with min assist;sit to/from stand;with adaptive equipment Pt Will Transfer to Toilet: with min assist;bedside commode Additional ADL Goal #1: Pt will complete bed mobility min (A) level with HOB flat no rails  Plan Discharge plan remains appropriate    Co-evaluation                 End of Session Equipment Utilized During Treatment: Gait belt   Activity Tolerance  Treatment limited secondary to medical complications (Comment) (BP orthostatic)   Patient Left in chair;with call bell/phone within reach;with chair alarm set;with nursing/sitter in room   Nurse Communication Mobility status;Precautions        Time: 1325-1407 OT Time Calculation (min): 42 min  Charges: OT General Charges $OT Visit: 1 Procedure OT Treatments $Self Care/Home Management : 38-52 mins  Harolyn RutherfordJones, Wyett Narine B 11/20/2014, 5:22 PM Pager: [phone]

## 2014-11-20 NOTE — Clinical Social Work Note (Signed)
CSW met with the pt, pt's son Judithann Graves wife Wilburt Finlay and the pt's sister Remo Lipps. All parties agreed that the pt will have 24/7 care at home. Barnabas Lister reported that he will take FMLA to aid his mother once she come from for CIR, if CIR take the pt. CSW left Pamala Hurry a voice message regarding the updated information.   The pt does has a bed offer at [facility].   [place], MSW, [place]

## 2014-11-20 NOTE — Telephone Encounter (Signed)
error 

## 2014-11-20 NOTE — Progress Notes (Signed)
Notified by PT. That patient was in need of nurse, as during ambulation in hallway, patient's knees gave way and momentarily was not responding to therapy assistant. Patient was placed in recliner and immediately return to room where she came to. Patients BP taken and was noted to be 76/54. Dr. Jeral FruitBotero notified (in OR ) and stated that he would be up to see patient soon.

## 2014-11-20 NOTE — Progress Notes (Signed)
Contacted by SW to reassess pt for a possible inpt rehab admission now that 24/7 supports available at home. We can do consult if pt not medically ready to d/c, But inpt rehab beds limited. I would recommend clarification with Dr. Jeral FruitBotero and referral to other [facility] [phone]

## 2014-11-20 NOTE — Progress Notes (Signed)
Dr. Jeral FruitBotero, patient's sister/POA Anselmo PicklerJoan Gough would like to speak with you. [phone] / [phone]

## 2014-11-20 NOTE — Progress Notes (Signed)
Physical Therapy Treatment Patient Details Name: [name] Weber MRN: [id number] DOB: 12/14/1930 Today's Date: 11/20/2014    History of Present Illness 80 yo female s/p epidural hematoma evacuation T11-12 cord compression by Dr Jeral FruitBotero. PMH: palpitations, PAC, PVS, Myalgia,     PT Comments    Patient highly motivated and eager to ambulate with therapy this morning. BP prior to ambulation was 105/58 and patient stated that she had not been dizzy or lightheaded. Patient on her second bout of ambulation this session when her knees gave way and she was not responding to therapy assistant. Patient was placed in recliner and immediately return to room where she came to. Patients BP taken and was noted to be 76/54. RN was in room at end of session along with multiple family members. Family is now adamant that they can provide 24/7 assistance for patient at discharge and wish to continue therapy at CIR. CSW worker also happened to come in the room at the end of the session and was able to speak with the family as well. Will update accordingly and recommend CIR at this time. I do believe patient is a great CIR patient as she is highly motivated. Explained the process with the family and will await further word.   Follow Up Recommendations  CIR     Equipment Recommendations  None recommended by PT    Recommendations for Other Services       Precautions / Restrictions Precautions Precautions: Fall;Back Precaution Comments: hemovac Restrictions Weight Bearing Restrictions: No    Mobility  Bed Mobility         Supine to sit: Min assist     General bed mobility comments: cues for safety and sequence. A for trunk support into sitting  Transfers Overall transfer level: Needs assistance Equipment used: Rolling walker (2 wheeled)   Sit to Stand: Mod assist         General transfer comment: cues for hand placement and safety. Mod A to power up into standing and ensure balance. Patient  with instability of B LE/ knee buckling  Ambulation/Gait Ambulation/Gait assistance: Mod assist;+2 safety/equipment Ambulation Distance (Feet): 50 Feet (10) Assistive device: Rolling walker (2 wheeled) Gait Pattern/deviations: Step-to pattern;Ataxic;Decreased weight shift to right;Decreased weight shift to left Gait velocity: decreased   General Gait Details: Patient very ataxic and varying from narrow to wide BOS. Cues for postioning and use of RW. On second trial of ambulation, patient began to buckle and at that point passed out on therapist but was able to position back into chair. BP 76/54. see above comments   Stairs            Wheelchair Mobility    Modified Rankin (Stroke Patients Only)       Balance                                    Cognition Arousal/Alertness: Awake/alert Behavior During Therapy: WFL for tasks assessed/performed Overall Cognitive Status: Impaired/Different from baseline Area of Impairment: Memory;Awareness;Problem solving     Memory: Decreased short-term memory     Awareness: Emergent        Exercises      General Comments        Pertinent Vitals/Pain Pain Assessment: No/denies pain    Home Living                      Prior Function  PT Goals (current goals can now be found in the care plan section) Progress towards PT goals: Progressing toward goals    Frequency  Min 3X/week    PT Plan Discharge plan needs to be updated    Co-evaluation             End of Session Equipment Utilized During Treatment: Gait belt Activity Tolerance: Treatment limited secondary to medical complications (Comment) (decreased BP) Patient left: in chair;with call bell/phone within reach;with family/visitor present     Time: 4098-11910955-1029 PT Time Calculation (min) (ACUTE ONLY): 34 min  Charges:  $Gait Training: 8-22 mins $Therapeutic Activity: 8-22 mins                    G Codes:      Fredrich BirksRobinette,  Kennley Schwandt Elizabeth 11/20/2014, 12:01 PM 11/20/2014 Fredrich Birksobinette, Sania Noy Elizabeth PTA [phone] pager [phone] office

## 2014-11-21 DIAGNOSIS — R55 Syncope and collapse: Secondary | ICD-10-CM

## 2014-11-21 HISTORY — DX: Syncope and collapse: R55

## 2014-11-21 MED ORDER — DILTIAZEM HCL 30 MG PO TABS
30.0000 mg | ORAL_TABLET | Freq: Four times a day (QID) | ORAL | Status: DC
  Administered 2014-11-21 – 2014-11-22 (×2): 30 mg via ORAL
  Filled 2014-11-21 (×3): qty 1

## 2014-11-21 NOTE — Progress Notes (Signed)
Inpt rehab bed not available to this pt this week nor has rehab consult been completed at this time. I have discussed with SW [facility]. When I discussed with pt other inpt rehab facility, [place] [phone]

## 2014-11-21 NOTE — Clinical Social Work Note (Signed)
CSW spoke with attending MD, per attending MD the Cardiologist has to clear the pt first. Once the pt is medically stable [facility], if CIR can not take the pt. CSW will contine to follow the pt.   Callee Rohrig, MSW, LCSWA [phone]

## 2014-11-21 NOTE — Progress Notes (Signed)
Physical Therapy Treatment Patient Details Name: [name] Weber MRN: [id number] DOB: 08/11/1931 Today's Date: 11/21/2014    History of Present Illness 80 yo female s/p epidural hematoma evacuation T11-12 cord compression by Dr Jeral FruitBotero. PMH: palpitations, PAC, PVS, Myalgia,     PT Comments    Patient continues to be limited by orthostatics this session and was unable to walk. Patient was taught LE therex in bed and was educated on chair positioning in bed.   Orthostatic BPs  Supine 107/76  Sitting 93/55  Sitting after 2 min 88/48  Standing unable  Standing after min unable    Follow Up Recommendations  CIR     Equipment Recommendations  None recommended by PT    Recommendations for Other Services       Precautions / Restrictions Precautions Precautions: Fall;Back Precaution Comments: hemovac, orthostatic BP    Mobility  Bed Mobility     Rolling: Min assist Sidelying to sit: Mod assist Supine to sit: Min assist     General bed mobility comments: A for trunk support into sitting and for LEs back into bed. Patient positioned in chair position in bed at end of session  Transfers                 General transfer comment: unable due to BP  Ambulation/Gait             General Gait Details: unable due to BP   Stairs            Wheelchair Mobility    Modified Rankin (Stroke Patients Only)       Balance     Sitting balance-Leahy Scale: Fair                              Cognition Arousal/Alertness: Awake/alert Behavior During Therapy: WFL for tasks assessed/performed Overall Cognitive Status: Within Functional Limits for tasks assessed                      Exercises General Exercises - Lower Extremity Quad Sets: AROM;Both;10 reps Short Arc Quad: AROM;Both;15 reps Heel Slides: AROM;Both;10 reps    General Comments        Pertinent Vitals/Pain Pain Assessment: No/denies pain    Home Living                       Prior Function            PT Goals (current goals can now be found in the care plan section) Progress towards PT goals: Progressing toward goals    Frequency  Min 3X/week    PT Plan Current plan remains appropriate    Co-evaluation             End of Session Equipment Utilized During Treatment: Gait belt Activity Tolerance: Treatment limited secondary to medical complications (Comment) (orthostatic BP)       Time: 4098-11911326-1352 PT Time Calculation (min) (ACUTE ONLY): 26 min  Charges:  $Therapeutic Activity: 23-37 mins                    G Codes:      Fredrich BirksRobinette, Julia Elizabeth 11/21/2014, 2:21 PM  11/21/2014 Fredrich Birksobinette, Julia Elizabeth PTA [phone] pager [phone] office

## 2014-11-21 NOTE — Progress Notes (Signed)
Patient ID: [name] Weber, female   DOB: [birth year], 80 y.o.   MRN: [id number].    SUBJECTIVE: Yesterday while ambulating with help, the patient felt poorly with presyncope. She was hypotensive at that time. She is feeling better now. Her meds have been held. She had been on a low-dose of beta blocker and a low dose of ramipril. I had changed her from short acting diltiazem to once a day diltiazem yesterday at a low dose. The patient has significant paroxysmal atrial flutter.    Filed Vitals:   11/20/14 2221 11/20/14 2249 11/21/14 0221 11/21/14 0613  BP: 80/46 109/59 114/67 128/73  Pulse: 92  83 74  Temp: 97.5 F (36.4 C)  98 F (36.7 C) 98.3 F (36.8 C)  TempSrc: Oral  Oral Oral  Resp: 20  20 20   Height:      Weight:      SpO2: 97%  99% 98%     Intake/Output Summary (Last 24 hours) at 11/21/14 0948 Last data filed at 11/20/14 1629  Gross per 24 hour  Intake      0 ml  Output   1200 ml  Net  -1200 ml    LABS: Basic Metabolic Panel:  Recent Labs  78/29/56 0540  NA 139  K 4.4  CL 100  CO2 28  GLUCOSE 131*  BUN 18  CREATININE 0.52  CALCIUM 9.0   Liver Function Tests: No results for input(s): AST, ALT, ALKPHOS, BILITOT, PROT, ALBUMIN in the last 72 hours. No results for input(s): LIPASE, AMYLASE in the last 72 hours. CBC:  Recent Labs  11/19/14 0540  WBC 11.8*  HGB 13.1  HCT 39.8  MCV 97.1  PLT 146*   Cardiac Enzymes: No results for input(s): CKTOTAL, CKMB, CKMBINDEX, TROPONINI in the last 72 hours. BNP: Invalid input(s): POCBNP D-Dimer: No results for input(s): DDIMER in the last 72 hours. Hemoglobin A1C: No results for input(s): HGBA1C in the last 72 hours. Fasting Lipid Panel: No results for input(s): CHOL, HDL, LDLCALC, TRIG, CHOLHDL, LDLDIRECT in the last 72 hours. Thyroid Function Tests: No results for input(s): TSH, T4TOTAL, T3FREE, THYROIDAB in the last 72 hours.  Invalid input(s): FREET3  RADIOLOGY: Ct Abdomen Pelvis Wo  Contrast  11/16/2014   CLINICAL DATA:  Right flank pain for 1 day with nausea and vomiting. Initial encounter.  EXAM: CT ABDOMEN AND PELVIS WITHOUT CONTRAST  TECHNIQUE: Multidetector CT imaging of the abdomen and pelvis was performed following the standard protocol without IV contrast.  COMPARISON:  05/27/2010 abdominal pelvic CT.  FINDINGS: Lower chest: Clear lung bases. No significant pleural or pericardial effusion.  Hepatobiliary: As evaluated in the noncontrast state, the liver appears normal without focal abnormality. No evidence of gallstones, gallbladder wall thickening or biliary dilatation.  Pancreas: Unremarkable. No pancreatic ductal dilatation or surrounding inflammatory changes.  Spleen: Normal in size without focal abnormality.  Adrenals/Urinary Tract: Both adrenal glands appear normal.There is no evidence of urinary tract calculus or hydronephrosis. This is small parenchymal calcification in the upper pole of the left kidney. A simple cyst posteriorly in the left kidney appears stable. The distal ureters are not well visualized, but no ureteral or bladder calculi are seen.  Stomach/Bowel: No evidence of bowel wall thickening, distention or surrounding inflammatory change.Mild distal colonic diverticular changes are present.  Vascular/Lymphatic: There are no enlarged abdominal or pelvic lymph nodes. There is diffuse aortoiliac atherosclerosis.  Reproductive: Status post hysterectomy. No evidence of adnexal mass.  Other: No evidence of abdominal wall mass  or hernia.  Musculoskeletal: No acute or significant osseous findings. There are degenerative changes throughout the lumbar spine associated with a convex left scoliosis.  IMPRESSION: IMPRESSION 1. No evidence of urinary tract calculus or hydronephrosis. 2. No acute findings identified. 3. Diffuse atherosclerosis and mild distal colonic diverticulosis noted.   Electronically Signed   By: Roxy Horseman M.D.   On: 11/16/2014 10:43   Dg Chest 2  View  11/13/2014   CLINICAL DATA:  Recent injury while working out with difficulty breathing and right-sided chest pain  EXAM: CHEST  2 VIEW  COMPARISON:  None.  FINDINGS: Cardiac shadow is within normal limits. The lungs are hyperinflated consistent with COPD. No pneumothorax or sizable effusion is seen. No focal infiltrate is noted.  IMPRESSION: COPD without acute abnormality.   Electronically Signed   By: Alcide Clever M.D.   On: 11/13/2014 12:09   Ct Thoracic Spine Wo Contrast  11/18/2014   CLINICAL DATA:  80 year old female who presented with back pain and progressive lower extremity weakness, found to have T10 level spinal hematoma subsequently determined to be mostly subdural on neurosurgical decompression and evacuation. Initially significantclinical improvement in symptoms, but now with lower extremity recurrent weakness. Postoperative lumbar MRI with increased ventral spinal, but possibly superimposed on a significant amount of intraspinal gas. Further evaluation of that finding with CT. Initial encounter.  EXAM: CT THORACIC AND LUMBAR SPINE WITHOUT CONTRAST  TECHNIQUE: Multidetector CT imaging of the thoracic and lumbar spine was performed without contrast. Multiplanar CT image reconstructions were also generated.  COMPARISON:  Postoperative lumbar MRI today at 1138 hrs. Preoperative thoracic and lumbar MRI 11/16/2014.  FINDINGS: CT THORACIC SPINE FINDINGS  Posterior element postoperative changes from the T11 spinous process continuing into the upper lumbar spine. There is a percutaneous postoperative drain in place, extending deep into the laminectomy space and terminating along the left L2 spinous process. There is a small volume of superficial and laminectomy space postoperative gas.  With regard to the dark T2/stir signal seen along the ventral spinal canal in the lower thoracic and lumbar spine, there is only a small volume of gas present in the ventral canal posterior to the L1 vertebral body,  and at the T12-L1 disc space. There is vacuum disc phenomena at T12-L1. No other gas within the thoracic spinal canal. With narrow CT windows, the ventral spinal canal density is seen to be hyperdense to CSF compatible with blood products (see series 207, image 19 and axial images 122 and 153 of series 202). As noted on the recent MRI this tapers cephalad to the T7 level.  Moderate size layering bilateral pleural effusions. Compressive atelectasis in both lungs, but more confluent left upper lobe opacity (series 201, image 43).  Calcified atherosclerosis of the thoracic aorta.  CT LUMBAR SPINE FINDINGS  Stable lumbar vertebral height and alignment.  Postoperative changes to the posterior elements from the lower thoracic spine continuing to the L1 lamina. Postoperative drain in place as detailed above.  As stated above there is only a small volume of gas in the ventral spinal canal at the L1 level, and also along the right lateral aspect of the right L1-L2 facet. Therefore, the bulk of the space-occupying process seen on the postoperative MRI is blood products. With narrow CT windows, hyperdense blood products are visible in the ventral canal, and more circumferentially in the lower lumbar spine (e.g. L3-L4 series 202, image 65).  Small volume superficial postoperative gas in the subcutaneous fat. Subcutaneous edema. Skin staples  in place.  Aortoiliac calcified atherosclerosis noted. Visible sacrum and SI joints intact.  IMPRESSION: 1. Only trace gas within the spinal canal, limited to the L1 level. Therefore, the (mostly ventral) space-occupying process seen by lumbar MRI earlier today tracking from the lumbar spine cephalad and tapering at the T7 level is blood products. 2. Postoperative changes to the posterior elements T11 through L1. Laminectomy space drain in place. 3. Moderate bilateral layering pleural effusions. Compressive atelectasis in both lungs, but more confluent left upper lobe opacity suspicious for  developing pneumonia.   Electronically Signed   By: Augusto Gamble M.D.   On: 11/18/2014 14:10   Ct Lumbar Spine Wo Contrast  11/18/2014   CLINICAL DATA:  80 year old female who presented with back pain and progressive lower extremity weakness, found to have T10 level spinal hematoma subsequently determined to be mostly subdural on neurosurgical decompression and evacuation. Initially significantclinical improvement in symptoms, but now with lower extremity recurrent weakness. Postoperative lumbar MRI with increased ventral spinal, but possibly superimposed on a significant amount of intraspinal gas. Further evaluation of that finding with CT. Initial encounter.  EXAM: CT THORACIC AND LUMBAR SPINE WITHOUT CONTRAST  TECHNIQUE: Multidetector CT imaging of the thoracic and lumbar spine was performed without contrast. Multiplanar CT image reconstructions were also generated.  COMPARISON:  Postoperative lumbar MRI today at 1138 hrs. Preoperative thoracic and lumbar MRI 11/16/2014.  FINDINGS: CT THORACIC SPINE FINDINGS  Posterior element postoperative changes from the T11 spinous process continuing into the upper lumbar spine. There is a percutaneous postoperative drain in place, extending deep into the laminectomy space and terminating along the left L2 spinous process. There is a small volume of superficial and laminectomy space postoperative gas.  With regard to the dark T2/stir signal seen along the ventral spinal canal in the lower thoracic and lumbar spine, there is only a small volume of gas present in the ventral canal posterior to the L1 vertebral body, and at the T12-L1 disc space. There is vacuum disc phenomena at T12-L1. No other gas within the thoracic spinal canal. With narrow CT windows, the ventral spinal canal density is seen to be hyperdense to CSF compatible with blood products (see series 207, image 19 and axial images 122 and 153 of series 202). As noted on the recent MRI this tapers cephalad to the T7  level.  Moderate size layering bilateral pleural effusions. Compressive atelectasis in both lungs, but more confluent left upper lobe opacity (series 201, image 43).  Calcified atherosclerosis of the thoracic aorta.  CT LUMBAR SPINE FINDINGS  Stable lumbar vertebral height and alignment.  Postoperative changes to the posterior elements from the lower thoracic spine continuing to the L1 lamina. Postoperative drain in place as detailed above.  As stated above there is only a small volume of gas in the ventral spinal canal at the L1 level, and also along the right lateral aspect of the right L1-L2 facet. Therefore, the bulk of the space-occupying process seen on the postoperative MRI is blood products. With narrow CT windows, hyperdense blood products are visible in the ventral canal, and more circumferentially in the lower lumbar spine (e.g. L3-L4 series 202, image 65).  Small volume superficial postoperative gas in the subcutaneous fat. Subcutaneous edema. Skin staples in place.  Aortoiliac calcified atherosclerosis noted. Visible sacrum and SI joints intact.  IMPRESSION: 1. Only trace gas within the spinal canal, limited to the L1 level. Therefore, the (mostly ventral) space-occupying process seen by lumbar MRI earlier today tracking from  the lumbar spine cephalad and tapering at the T7 level is blood products. 2. Postoperative changes to the posterior elements T11 through L1. Laminectomy space drain in place. 3. Moderate bilateral layering pleural effusions. Compressive atelectasis in both lungs, but more confluent left upper lobe opacity suspicious for developing pneumonia.   Electronically Signed   By: Augusto GambleLee  Hall M.D.   On: 11/18/2014 14:10   Mr Lumbar Spine Wo Contrast  11/18/2014   ADDENDUM REPORT: 11/18/2014 12:58  ADDENDUM: Correction com of the first sentence of the Clinical Data section should read: "80 year old female with acute onset severe back pain after PILATES CLASS. "   Electronically Signed   By:  Augusto GambleLee  Hall M.D.   On: 11/18/2014 12:58   11/18/2014   CLINICAL DATA:  80 year old female with acute onset severe back pain after pole Oddi is class. Pain radiating anteriorly in the thorax, and progressing. Subsequent progressive weakness in her lower extremities, unable to walk. Current history of atrial fibrillation on Coumadin. Initial encounter.  EXAM: MRI THORACIC AND LUMBAR SPINE WITHOUT AND WITH CONTRAST  TECHNIQUE: Multiplanar and multiecho pulse sequences of the thoracic and lumbar spine were obtained without and with intravenous contrast.  CONTRAST:  10mL MULTIHANCE GADOBENATE DIMEGLUMINE 529 MG/ML IV SOLN  COMPARISON:  CT Abdomen and Pelvis without contrast 1024 hr today, and contrast-enhanced CT Abdomen and Pelvis 05/27/2010.  FINDINGS: MR THORACIC SPINE FINDINGS  Limited sagittal imaging the cervical spine remarkable for widespread chronic disc and endplate degeneration.  Thoracic vertebral height and alignment within normal limits, aside from mild anterolisthesis at T11-T12 which is stable since 2011. Benign upper thoracic vertebral body hemangiomas are suspected including at T3 where there is mild enhancement of the lesion (series 18, image 5). No marrow edema or evidence of acute osseous abnormality.  Negative visualized posterior paraspinal soft tissues.  There is a small layering left pleural effusion. Dependent pulmonary atelectasis. Negative visualized upper abdominal viscera.  No upper thoracic spinal stenosis, however, there is abnormal upper thoracic spinal cord signal at the T4 level (series 5, image 7 and series 8, image 9). This signal appears to be located centrally and may also involve the dorsal columns. No cord expansion. No associated cord enhancement. The adjacent spinal cord segments appear within normal limits. No significant degenerative changes in this region.  Severe abnormality in the lower thoracic spine extending from the T10 to the T12 level, with a lobulated somewhat  multiloculated appearing process occupying the ventral spinal canal at these levels, encompassing 9 x 14 x 55 mm (AP by transverse by CC). The abnormality is heterogeneously hyperintense on T2 imaging, hyperintense on STIR imaging, and nearly isointense to CSF on T1 precontrast imaging. However, the lesion significantly loses signal on axial gradient echo imaging. Following contrast are several curvilinear areas of enhancement, mostly along the periphery of the lesion (series 18, image 6). Precontrast images do not suggest favor this process is extradural, but intradural location is difficult to exclude.  There is associated cord compression. The cord is posteriorly displaced and compressed, maximally at T11 (series 8, image 32). There does appear to be abnormal associated cord signal.  The most distal spinal cord and conus medullaris are unaffected and appear within normal limits. No intra-axial enhancement.  No abnormality in this region was evident on the 2011 contrast-enhanced CT Abdomen and Pelvis.  MR LUMBAR SPINE FINDINGS  Lumbar segmentation is normal. Chronic L5-S1 anterolisthesis has mildly progressed since 2011. Mild retrolisthesis at L3-L4 has progressed. There are associated degenerative  changes at these levels, including advanced disc and endplate degeneration at L4-L5 were there is mild marrow edema. No acute osseous abnormality identified.  There is patchy increased STIR signal in some of the posterior paraspinal soft tissues, nonspecific.  The conus medullaris is normal at L1-L2. The space-occupying mass in the lower thoracic spine appears to terminate at the T12-L1 disc space level. Cauda equina nerve roots are within normal limits, aside from the degenerative spinal stenosis findings described below.  However, there is trace layering hemorrhage at the term in a shin of the thecal sac at the S2-S3 level (series 13, image 6 and series 16, image 33).  Visualized abdominal viscera are stable since  2011.  The following degenerative changes are noted in the lumbar spine.  L2-L3: Lobulated disc bulge and severe facet hypertrophy. Mild spinal and L2 biforaminal stenosis.  L3-L4: Severe facet and ligament flavum hypertrophy with facet joint fluid. Broad-based disc bulge. Moderate spinal stenosis and right L3 foraminal stenosis.  L4-L5: Severe multifactorial spinal stenosis with right eccentric disc bulge and severe facet hypertrophy. Moderate right L4 foraminal stenosis.  L5-S1: Severe facet hypertrophy and degeneration with facet joint fluid. Grade 1 anterolisthesis at this level. Moderate ligament flavum hypertrophy. Broad-based disc/ pseudo disc. Mild spinal stenosis. Moderate left lateral recess stenosis. Moderate left L5 foraminal stenosis.  Visible sacrum intact.  Diverticulosis of the sigmoid colon.  IMPRESSION: 1. Lower thoracic spinal cord compression with abnormal cord signal related to a lobulated roughly 1 x 1.5 x 5.5 cm space-occupying mass in the ventral spinal canal from the T10 to the T12 level. This most resembles hemorrhage/hematoma. This could be occupying the ventral epidural space (slightly favored) or intradural, and there is trace layering hemorrhage in the caudal thecal sac at S2-S3. Legrand RamsFavor this is posttraumatic in nature rather than related to a spinal/dural AVM, however, small curvilinear areas of enhancement along the periphery of the hematoma are noted (perhaps due to active bleeding). 2.  No acute osseous abnormality identified. 3. Superimposed T4 level abnormal central and dorsal spinal cord signal, nonspecific. No degenerative changes in the region. Perhaps this is sequelae of prior transverse myelitis or less likely small cord infarct. 4. No acute findings in the lumbar spine. Moderate to severe degenerative spinal stenosis at L3-L4 and L4-L5. Critical Value/emergent results were called by telephone at the time of interpretation on 11/16/2014 at 1836 hrs. to Dr. Judie PetitM. Pheiffer in the ED  who verbally acknowledged these results.  Electronically Signed: By: Augusto GambleLee  Hall M.D. On: 11/16/2014 19:13   Mr Lumbar Spine Wo Contrast  11/18/2014   CLINICAL DATA:  80 year old female who presented with back pain and progressive lower extremity weakness, found to have T10 level spinal hematoma subsequently determine to be mostly subdural on neuro surgical decompression and evacuation. Initially significant clinical improvement in symptoms, but now with lower extremity recurrent weakness. Initial encounter.  EXAM: MRI LUMBAR SPINE WITHOUT CONTRAST  TECHNIQUE: Multiplanar, multisequence MR imaging of the lumbar spine was performed. No intravenous contrast was administered.  COMPARISON:  Thoracic and lumbar spine MRI 11/16/2014.  FINDINGS: Normal lumbar segmentation noted on the comparison. Stable lumbar vertebral height and alignment.  Posterior decompression from the T11 spinous process to the L2 spinous process. There is a small percutaneous postoperative drain in place at the laminectomy site. There is patchy T2 and STIR hyperintensity in the decompression space, along with a small volume of fluid.  Within the spinal canal there is new ventral space-occupying material measuring up to 6  mm in thickness which is widespread from the T11 level tracking caudally in the ventral lumbar spinal canal as far as L5. Much of this is extremely dark on T2 and STIR imaging and isointense on T1. There is also a bright T2 and STIR component which is dark on T1 weighted imaging. Limited sagittal imaging of the more superior spine was performed in order to visualize the upper most extent of this finding, it tracks cephalad tapering to the T7 level. Subsequently there is posterior displacement of the spinal cord/thecal sac throughout the extent of the finding.  Despite the continued mass effect, compression on the spinal cord has regressed at T10-T11, but there is new/increased lumbar spinal stenosis, maximal at L3-L4 and L4-L5  (severe).  In the sacral spinal canal there is increased but small volume blood products both layering in the thecal sac and adherent to the sacral nerve roots.  There may be mild residual increased T2 spinal cord signal at T10 and T11.  Stable visualized abdominal viscera.  IMPRESSION: 1. Postoperative changes from T11-L1 with postoperative drain in place and evacuation of the lobulated T10/T11 subdural hematoma. Regressed mass effect on the spinal cord at that level, perhaps mild residual T2 cord signal abnormality. 2. However, there is now increased mostly ventral spinal canal blood products tracking throughout the lumbar spine, and tapering cephalad to the T7 level. It is unclear whether some of this space-occupying material might be gas (there is a postoperative drain in place). Study discussed by telephone with Dr. Ronaldo Miyamoto CABBELL on 11/18/2014 at 12:49 . We will follow-up with CT of the thoracic and lumbar spine to evaluate postoperative gas versus blood products.   Electronically Signed   By: Augusto Gamble M.D.   On: 11/18/2014 12:56   Mr Thoracic Spine W Wo Contrast  11/18/2014   ADDENDUM REPORT: 11/18/2014 12:58  ADDENDUM: Correction com of the first sentence of the Clinical Data section should read: "80 year old female with acute onset severe back pain after PILATES CLASS. "   Electronically Signed   By: Augusto Gamble M.D.   On: 11/18/2014 12:58   11/18/2014   CLINICAL DATA:  80 year old female with acute onset severe back pain after pole Oddi is class. Pain radiating anteriorly in the thorax, and progressing. Subsequent progressive weakness in her lower extremities, unable to walk. Current history of atrial fibrillation on Coumadin. Initial encounter.  EXAM: MRI THORACIC AND LUMBAR SPINE WITHOUT AND WITH CONTRAST  TECHNIQUE: Multiplanar and multiecho pulse sequences of the thoracic and lumbar spine were obtained without and with intravenous contrast.  CONTRAST:  10mL MULTIHANCE GADOBENATE DIMEGLUMINE 529  MG/ML IV SOLN  COMPARISON:  CT Abdomen and Pelvis without contrast 1024 hr today, and contrast-enhanced CT Abdomen and Pelvis 05/27/2010.  FINDINGS: MR THORACIC SPINE FINDINGS  Limited sagittal imaging the cervical spine remarkable for widespread chronic disc and endplate degeneration.  Thoracic vertebral height and alignment within normal limits, aside from mild anterolisthesis at T11-T12 which is stable since 2011. Benign upper thoracic vertebral body hemangiomas are suspected including at T3 where there is mild enhancement of the lesion (series 18, image 5). No marrow edema or evidence of acute osseous abnormality.  Negative visualized posterior paraspinal soft tissues.  There is a small layering left pleural effusion. Dependent pulmonary atelectasis. Negative visualized upper abdominal viscera.  No upper thoracic spinal stenosis, however, there is abnormal upper thoracic spinal cord signal at the T4 level (series 5, image 7 and series 8, image 9). This signal appears to be  located centrally and may also involve the dorsal columns. No cord expansion. No associated cord enhancement. The adjacent spinal cord segments appear within normal limits. No significant degenerative changes in this region.  Severe abnormality in the lower thoracic spine extending from the T10 to the T12 level, with a lobulated somewhat multiloculated appearing process occupying the ventral spinal canal at these levels, encompassing 9 x 14 x 55 mm (AP by transverse by CC). The abnormality is heterogeneously hyperintense on T2 imaging, hyperintense on STIR imaging, and nearly isointense to CSF on T1 precontrast imaging. However, the lesion significantly loses signal on axial gradient echo imaging. Following contrast are several curvilinear areas of enhancement, mostly along the periphery of the lesion (series 18, image 6). Precontrast images do not suggest favor this process is extradural, but intradural location is difficult to exclude.   There is associated cord compression. The cord is posteriorly displaced and compressed, maximally at T11 (series 8, image 32). There does appear to be abnormal associated cord signal.  The most distal spinal cord and conus medullaris are unaffected and appear within normal limits. No intra-axial enhancement.  No abnormality in this region was evident on the 2011 contrast-enhanced CT Abdomen and Pelvis.  MR LUMBAR SPINE FINDINGS  Lumbar segmentation is normal. Chronic L5-S1 anterolisthesis has mildly progressed since 2011. Mild retrolisthesis at L3-L4 has progressed. There are associated degenerative changes at these levels, including advanced disc and endplate degeneration at L4-L5 were there is mild marrow edema. No acute osseous abnormality identified.  There is patchy increased STIR signal in some of the posterior paraspinal soft tissues, nonspecific.  The conus medullaris is normal at L1-L2. The space-occupying mass in the lower thoracic spine appears to terminate at the T12-L1 disc space level. Cauda equina nerve roots are within normal limits, aside from the degenerative spinal stenosis findings described below.  However, there is trace layering hemorrhage at the term in a shin of the thecal sac at the S2-S3 level (series 13, image 6 and series 16, image 33).  Visualized abdominal viscera are stable since 2011.  The following degenerative changes are noted in the lumbar spine.  L2-L3: Lobulated disc bulge and severe facet hypertrophy. Mild spinal and L2 biforaminal stenosis.  L3-L4: Severe facet and ligament flavum hypertrophy with facet joint fluid. Broad-based disc bulge. Moderate spinal stenosis and right L3 foraminal stenosis.  L4-L5: Severe multifactorial spinal stenosis with right eccentric disc bulge and severe facet hypertrophy. Moderate right L4 foraminal stenosis.  L5-S1: Severe facet hypertrophy and degeneration with facet joint fluid. Grade 1 anterolisthesis at this level. Moderate ligament flavum  hypertrophy. Broad-based disc/ pseudo disc. Mild spinal stenosis. Moderate left lateral recess stenosis. Moderate left L5 foraminal stenosis.  Visible sacrum intact.  Diverticulosis of the sigmoid colon.  IMPRESSION: 1. Lower thoracic spinal cord compression with abnormal cord signal related to a lobulated roughly 1 x 1.5 x 5.5 cm space-occupying mass in the ventral spinal canal from the T10 to the T12 level. This most resembles hemorrhage/hematoma. This could be occupying the ventral epidural space (slightly favored) or intradural, and there is trace layering hemorrhage in the caudal thecal sac at S2-S3. Legrand Rams this is posttraumatic in nature rather than related to a spinal/dural AVM, however, small curvilinear areas of enhancement along the periphery of the hematoma are noted (perhaps due to active bleeding). 2.  No acute osseous abnormality identified. 3. Superimposed T4 level abnormal central and dorsal spinal cord signal, nonspecific. No degenerative changes in the region. Perhaps this is sequelae  of prior transverse myelitis or less likely small cord infarct. 4. No acute findings in the lumbar spine. Moderate to severe degenerative spinal stenosis at L3-L4 and L4-L5. Critical Value/emergent results were called by telephone at the time of interpretation on 11/16/2014 at 1836 hrs. to Dr. Judie Petit. Pheiffer in the ED who verbally acknowledged these results.  Electronically Signed: By: Augusto Gamble M.D. On: 11/16/2014 19:13    PHYSICAL EXAM  patient is oriented to person time and place. Affect is normal. Head is atraumatic. Sclera and conjunctiva are normal. There is no jugular venous distention. Lungs are clear. Respiratory effort is nonlabored. Cardiac exam reveals an S1 and S2. The abdomen is soft. There is no peripheral edema.   TELEMETRY:  I have reviewed telemetry today November 21, 2014. She is still in sinus rhythm. However she has PACs.   ASSESSMENT AND PLAN:    Paroxysmal atrial flutter     When the  patient has atrial flutter she has an increased heart rate. However she does not have symptoms. It is important to try to have her on a medication that will help prevent rapid heart rate when she has her atrial flutter. However her blood pressure became low yesterday while ambulating. I stopped her ramipril and I have stopped the low-dose beta blocker. I changes the long-acting diltiazem of 120 mg to 30 mg every 6. We will watch her blood pressure response with this today.    Hypercholesterolemia   Paraplegia at T9 level   Weakness of both legs    Near syncope     The patient's blood pressure was low during this event. I'm cutting back her medications. It will be important for her to be adequately hydrated.  Willa Rough 11/21/2014 9:48 AM

## 2014-11-22 ENCOUNTER — Telehealth: Payer: Self-pay | Admitting: Nurse Practitioner

## 2014-11-22 DIAGNOSIS — I951 Orthostatic hypotension: Secondary | ICD-10-CM | POA: Clinically undetermined

## 2014-11-22 DIAGNOSIS — I369 Nonrheumatic tricuspid valve disorder, unspecified: Secondary | ICD-10-CM

## 2014-11-22 MED ORDER — FLUDROCORTISONE ACETATE 0.1 MG PO TABS
0.1000 mg | ORAL_TABLET | Freq: Every day | ORAL | Status: DC
  Administered 2014-11-22 – 2014-11-29 (×8): 0.1 mg via ORAL
  Filled 2014-11-22 (×8): qty 1

## 2014-11-22 MED ORDER — DIGOXIN 0.25 MG/ML IJ SOLN
0.2500 mg | Freq: Four times a day (QID) | INTRAMUSCULAR | Status: AC
  Administered 2014-11-22 – 2014-11-23 (×4): 0.25 mg via INTRAVENOUS
  Filled 2014-11-22 (×4): qty 1

## 2014-11-22 MED ORDER — DIGOXIN 125 MCG PO TABS
0.1250 mg | ORAL_TABLET | Freq: Every day | ORAL | Status: DC
  Administered 2014-11-23 – 2014-11-29 (×7): 0.125 mg via ORAL
  Filled 2014-11-22 (×7): qty 1

## 2014-11-22 NOTE — Telephone Encounter (Signed)
Encounter entered in error.

## 2014-11-22 NOTE — Progress Notes (Signed)
Patient complained of light headedness, dizziness, and nausea while sitting up in chair. BP 110/60, HR 74. Head was put down in chair, patient refused to move back to bed and refused any PRN medications. Once head was back, patient stated she no longer had symptoms. Will continue to monitor closely.

## 2014-11-22 NOTE — Progress Notes (Signed)
Patient ID: [name] Weber, female   DOB: Apr 18, 1931, 80 y.o.   MRN: [id number]    SUBJECTIVE: I had stopped all of the patient's cardiac meds that could lower blood pressure yesterday other than a small dose of Cardizem. Despite this she had significant orthostatic hypotension. It appears she will not be able to tolerate the Cardizem. We will need to use digoxin for rate control of her supraventricular tachycardia. I have put in the orders for digoxin. Also, I started Florinef this morning. I ordered a 2-D echo. Historically she has good left ventricular function. We need to be sure that this has not changed.   Filed Vitals:   11/21/14 1810 11/21/14 2000 11/21/14 2156 11/22/14 0537  BP: 134/62  114/64 121/66  Pulse: 93  74 84  Temp: 98.7 F (37.1 C)  98.7 F (37.1 C) 98.8 F (37.1 C)  TempSrc: Oral  Oral Oral  Resp: 20 18 18    Height:      Weight:      SpO2: 99%  98% 96%    No intake or output data in the 24 hours ending 11/22/14 0808  LABS: Basic Metabolic Panel: No results for input(s): NA, K, CL, CO2, GLUCOSE, BUN, CREATININE, CALCIUM, MG, PHOS in the last 72 hours. Liver Function Tests: No results for input(s): AST, ALT, ALKPHOS, BILITOT, PROT, ALBUMIN in the last 72 hours. No results for input(s): LIPASE, AMYLASE in the last 72 hours. CBC: No results for input(s): WBC, NEUTROABS, HGB, HCT, MCV, PLT in the last 72 hours. Cardiac Enzymes: No results for input(s): CKTOTAL, CKMB, CKMBINDEX, TROPONINI in the last 72 hours. BNP: Invalid input(s): POCBNP D-Dimer: No results for input(s): DDIMER in the last 72 hours. Hemoglobin A1C: No results for input(s): HGBA1C in the last 72 hours. Fasting Lipid Panel: No results for input(s): CHOL, HDL, LDLCALC, TRIG, CHOLHDL, LDLDIRECT in the last 72 hours. Thyroid Function Tests: No results for input(s): TSH, T4TOTAL, T3FREE, THYROIDAB in the last 72 hours.  Invalid input(s): FREET3  RADIOLOGY: Ct Abdomen Pelvis Wo  Contrast  11/16/2014   CLINICAL DATA:  Right flank pain for 1 day with nausea and vomiting. Initial encounter.  EXAM: CT ABDOMEN AND PELVIS WITHOUT CONTRAST  TECHNIQUE: Multidetector CT imaging of the abdomen and pelvis was performed following the standard protocol without IV contrast.  COMPARISON:  05/27/2010 abdominal pelvic CT.  FINDINGS: Lower chest: Clear lung bases. No significant pleural or pericardial effusion.  Hepatobiliary: As evaluated in the noncontrast state, the liver appears normal without focal abnormality. No evidence of gallstones, gallbladder wall thickening or biliary dilatation.  Pancreas: Unremarkable. No pancreatic ductal dilatation or surrounding inflammatory changes.  Spleen: Normal in size without focal abnormality.  Adrenals/Urinary Tract: Both adrenal glands appear normal.There is no evidence of urinary tract calculus or hydronephrosis. This is small parenchymal calcification in the upper pole of the left kidney. A simple cyst posteriorly in the left kidney appears stable. The distal ureters are not well visualized, but no ureteral or bladder calculi are seen.  Stomach/Bowel: No evidence of bowel wall thickening, distention or surrounding inflammatory change.Mild distal colonic diverticular changes are present.  Vascular/Lymphatic: There are no enlarged abdominal or pelvic lymph nodes. There is diffuse aortoiliac atherosclerosis.  Reproductive: Status post hysterectomy. No evidence of adnexal mass.  Other: No evidence of abdominal wall mass or hernia.  Musculoskeletal: No acute or significant osseous findings. There are degenerative changes throughout the lumbar spine associated with a convex left scoliosis.  IMPRESSION: IMPRESSION 1. No evidence  of urinary tract calculus or hydronephrosis. 2. No acute findings identified. 3. Diffuse atherosclerosis and mild distal colonic diverticulosis noted.   Electronically Signed   By: Roxy Horseman M.D.   On: 11/16/2014 10:43   Dg Chest 2  View  11/13/2014   CLINICAL DATA:  Recent injury while working out with difficulty breathing and right-sided chest pain  EXAM: CHEST  2 VIEW  COMPARISON:  None.  FINDINGS: Cardiac shadow is within normal limits. The lungs are hyperinflated consistent with COPD. No pneumothorax or sizable effusion is seen. No focal infiltrate is noted.  IMPRESSION: COPD without acute abnormality.   Electronically Signed   By: Alcide Clever M.D.   On: 11/13/2014 12:09   Ct Thoracic Spine Wo Contrast  11/18/2014   CLINICAL DATA:  80 year old female who presented with back pain and progressive lower extremity weakness, found to have T10 level spinal hematoma subsequently determined to be mostly subdural on neurosurgical decompression and evacuation. Initially significantclinical improvement in symptoms, but now with lower extremity recurrent weakness. Postoperative lumbar MRI with increased ventral spinal, but possibly superimposed on a significant amount of intraspinal gas. Further evaluation of that finding with CT. Initial encounter.  EXAM: CT THORACIC AND LUMBAR SPINE WITHOUT CONTRAST  TECHNIQUE: Multidetector CT imaging of the thoracic and lumbar spine was performed without contrast. Multiplanar CT image reconstructions were also generated.  COMPARISON:  Postoperative lumbar MRI today at 1138 hrs. Preoperative thoracic and lumbar MRI 11/16/2014.  FINDINGS: CT THORACIC SPINE FINDINGS  Posterior element postoperative changes from the T11 spinous process continuing into the upper lumbar spine. There is a percutaneous postoperative drain in place, extending deep into the laminectomy space and terminating along the left L2 spinous process. There is a small volume of superficial and laminectomy space postoperative gas.  With regard to the dark T2/stir signal seen along the ventral spinal canal in the lower thoracic and lumbar spine, there is only a small volume of gas present in the ventral canal posterior to the L1 vertebral body,  and at the T12-L1 disc space. There is vacuum disc phenomena at T12-L1. No other gas within the thoracic spinal canal. With narrow CT windows, the ventral spinal canal density is seen to be hyperdense to CSF compatible with blood products (see series 207, image 19 and axial images 122 and 153 of series 202). As noted on the recent MRI this tapers cephalad to the T7 level.  Moderate size layering bilateral pleural effusions. Compressive atelectasis in both lungs, but more confluent left upper lobe opacity (series 201, image 43).  Calcified atherosclerosis of the thoracic aorta.  CT LUMBAR SPINE FINDINGS  Stable lumbar vertebral height and alignment.  Postoperative changes to the posterior elements from the lower thoracic spine continuing to the L1 lamina. Postoperative drain in place as detailed above.  As stated above there is only a small volume of gas in the ventral spinal canal at the L1 level, and also along the right lateral aspect of the right L1-L2 facet. Therefore, the bulk of the space-occupying process seen on the postoperative MRI is blood products. With narrow CT windows, hyperdense blood products are visible in the ventral canal, and more circumferentially in the lower lumbar spine (e.g. L3-L4 series 202, image 65).  Small volume superficial postoperative gas in the subcutaneous fat. Subcutaneous edema. Skin staples in place.  Aortoiliac calcified atherosclerosis noted. Visible sacrum and SI joints intact.  IMPRESSION: 1. Only trace gas within the spinal canal, limited to the L1 level. Therefore, the (  mostly ventral) space-occupying process seen by lumbar MRI earlier today tracking from the lumbar spine cephalad and tapering at the T7 level is blood products. 2. Postoperative changes to the posterior elements T11 through L1. Laminectomy space drain in place. 3. Moderate bilateral layering pleural effusions. Compressive atelectasis in both lungs, but more confluent left upper lobe opacity suspicious for  developing pneumonia.   Electronically Signed   By: Augusto Gamble M.D.   On: 11/18/2014 14:10   Ct Lumbar Spine Wo Contrast  11/18/2014   CLINICAL DATA:  80 year old female who presented with back pain and progressive lower extremity weakness, found to have T10 level spinal hematoma subsequently determined to be mostly subdural on neurosurgical decompression and evacuation. Initially significantclinical improvement in symptoms, but now with lower extremity recurrent weakness. Postoperative lumbar MRI with increased ventral spinal, but possibly superimposed on a significant amount of intraspinal gas. Further evaluation of that finding with CT. Initial encounter.  EXAM: CT THORACIC AND LUMBAR SPINE WITHOUT CONTRAST  TECHNIQUE: Multidetector CT imaging of the thoracic and lumbar spine was performed without contrast. Multiplanar CT image reconstructions were also generated.  COMPARISON:  Postoperative lumbar MRI today at 1138 hrs. Preoperative thoracic and lumbar MRI 11/16/2014.  FINDINGS: CT THORACIC SPINE FINDINGS  Posterior element postoperative changes from the T11 spinous process continuing into the upper lumbar spine. There is a percutaneous postoperative drain in place, extending deep into the laminectomy space and terminating along the left L2 spinous process. There is a small volume of superficial and laminectomy space postoperative gas.  With regard to the dark T2/stir signal seen along the ventral spinal canal in the lower thoracic and lumbar spine, there is only a small volume of gas present in the ventral canal posterior to the L1 vertebral body, and at the T12-L1 disc space. There is vacuum disc phenomena at T12-L1. No other gas within the thoracic spinal canal. With narrow CT windows, the ventral spinal canal density is seen to be hyperdense to CSF compatible with blood products (see series 207, image 19 and axial images 122 and 153 of series 202). As noted on the recent MRI this tapers cephalad to the T7  level.  Moderate size layering bilateral pleural effusions. Compressive atelectasis in both lungs, but more confluent left upper lobe opacity (series 201, image 43).  Calcified atherosclerosis of the thoracic aorta.  CT LUMBAR SPINE FINDINGS  Stable lumbar vertebral height and alignment.  Postoperative changes to the posterior elements from the lower thoracic spine continuing to the L1 lamina. Postoperative drain in place as detailed above.  As stated above there is only a small volume of gas in the ventral spinal canal at the L1 level, and also along the right lateral aspect of the right L1-L2 facet. Therefore, the bulk of the space-occupying process seen on the postoperative MRI is blood products. With narrow CT windows, hyperdense blood products are visible in the ventral canal, and more circumferentially in the lower lumbar spine (e.g. L3-L4 series 202, image 65).  Small volume superficial postoperative gas in the subcutaneous fat. Subcutaneous edema. Skin staples in place.  Aortoiliac calcified atherosclerosis noted. Visible sacrum and SI joints intact.  IMPRESSION: 1. Only trace gas within the spinal canal, limited to the L1 level. Therefore, the (mostly ventral) space-occupying process seen by lumbar MRI earlier today tracking from the lumbar spine cephalad and tapering at the T7 level is blood products. 2. Postoperative changes to the posterior elements T11 through L1. Laminectomy space drain in place. 3. Moderate  bilateral layering pleural effusions. Compressive atelectasis in both lungs, but more confluent left upper lobe opacity suspicious for developing pneumonia.   Electronically Signed   By: Augusto GambleLee  Hall M.D.   On: 11/18/2014 14:10   Mr Lumbar Spine Wo Contrast  11/18/2014   ADDENDUM REPORT: 11/18/2014 12:58  ADDENDUM: Correction com of the first sentence of the Clinical Data section should read: "80 year old female with acute onset severe back pain after PILATES CLASS. "   Electronically Signed   By:  Augusto GambleLee  Hall M.D.   On: 11/18/2014 12:58   11/18/2014   CLINICAL DATA:  80 year old female with acute onset severe back pain after pole Oddi is class. Pain radiating anteriorly in the thorax, and progressing. Subsequent progressive weakness in her lower extremities, unable to walk. Current history of atrial fibrillation on Coumadin. Initial encounter.  EXAM: MRI THORACIC AND LUMBAR SPINE WITHOUT AND WITH CONTRAST  TECHNIQUE: Multiplanar and multiecho pulse sequences of the thoracic and lumbar spine were obtained without and with intravenous contrast.  CONTRAST:  10mL MULTIHANCE GADOBENATE DIMEGLUMINE 529 MG/ML IV SOLN  COMPARISON:  CT Abdomen and Pelvis without contrast 1024 hr today, and contrast-enhanced CT Abdomen and Pelvis 05/27/2010.  FINDINGS: MR THORACIC SPINE FINDINGS  Limited sagittal imaging the cervical spine remarkable for widespread chronic disc and endplate degeneration.  Thoracic vertebral height and alignment within normal limits, aside from mild anterolisthesis at T11-T12 which is stable since 2011. Benign upper thoracic vertebral body hemangiomas are suspected including at T3 where there is mild enhancement of the lesion (series 18, image 5). No marrow edema or evidence of acute osseous abnormality.  Negative visualized posterior paraspinal soft tissues.  There is a small layering left pleural effusion. Dependent pulmonary atelectasis. Negative visualized upper abdominal viscera.  No upper thoracic spinal stenosis, however, there is abnormal upper thoracic spinal cord signal at the T4 level (series 5, image 7 and series 8, image 9). This signal appears to be located centrally and may also involve the dorsal columns. No cord expansion. No associated cord enhancement. The adjacent spinal cord segments appear within normal limits. No significant degenerative changes in this region.  Severe abnormality in the lower thoracic spine extending from the T10 to the T12 level, with a lobulated somewhat  multiloculated appearing process occupying the ventral spinal canal at these levels, encompassing 9 x 14 x 55 mm (AP by transverse by CC). The abnormality is heterogeneously hyperintense on T2 imaging, hyperintense on STIR imaging, and nearly isointense to CSF on T1 precontrast imaging. However, the lesion significantly loses signal on axial gradient echo imaging. Following contrast are several curvilinear areas of enhancement, mostly along the periphery of the lesion (series 18, image 6). Precontrast images do not suggest favor this process is extradural, but intradural location is difficult to exclude.  There is associated cord compression. The cord is posteriorly displaced and compressed, maximally at T11 (series 8, image 32). There does appear to be abnormal associated cord signal.  The most distal spinal cord and conus medullaris are unaffected and appear within normal limits. No intra-axial enhancement.  No abnormality in this region was evident on the 2011 contrast-enhanced CT Abdomen and Pelvis.  MR LUMBAR SPINE FINDINGS  Lumbar segmentation is normal. Chronic L5-S1 anterolisthesis has mildly progressed since 2011. Mild retrolisthesis at L3-L4 has progressed. There are associated degenerative changes at these levels, including advanced disc and endplate degeneration at L4-L5 were there is mild marrow edema. No acute osseous abnormality identified.  There is patchy increased STIR signal  in some of the posterior paraspinal soft tissues, nonspecific.  The conus medullaris is normal at L1-L2. The space-occupying mass in the lower thoracic spine appears to terminate at the T12-L1 disc space level. Cauda equina nerve roots are within normal limits, aside from the degenerative spinal stenosis findings described below.  However, there is trace layering hemorrhage at the term in a shin of the thecal sac at the S2-S3 level (series 13, image 6 and series 16, image 33).  Visualized abdominal viscera are stable since  2011.  The following degenerative changes are noted in the lumbar spine.  L2-L3: Lobulated disc bulge and severe facet hypertrophy. Mild spinal and L2 biforaminal stenosis.  L3-L4: Severe facet and ligament flavum hypertrophy with facet joint fluid. Broad-based disc bulge. Moderate spinal stenosis and right L3 foraminal stenosis.  L4-L5: Severe multifactorial spinal stenosis with right eccentric disc bulge and severe facet hypertrophy. Moderate right L4 foraminal stenosis.  L5-S1: Severe facet hypertrophy and degeneration with facet joint fluid. Grade 1 anterolisthesis at this level. Moderate ligament flavum hypertrophy. Broad-based disc/ pseudo disc. Mild spinal stenosis. Moderate left lateral recess stenosis. Moderate left L5 foraminal stenosis.  Visible sacrum intact.  Diverticulosis of the sigmoid colon.  IMPRESSION: 1. Lower thoracic spinal cord compression with abnormal cord signal related to a lobulated roughly 1 x 1.5 x 5.5 cm space-occupying mass in the ventral spinal canal from the T10 to the T12 level. This most resembles hemorrhage/hematoma. This could be occupying the ventral epidural space (slightly favored) or intradural, and there is trace layering hemorrhage in the caudal thecal sac at S2-S3. Legrand RamsFavor this is posttraumatic in nature rather than related to a spinal/dural AVM, however, small curvilinear areas of enhancement along the periphery of the hematoma are noted (perhaps due to active bleeding). 2.  No acute osseous abnormality identified. 3. Superimposed T4 level abnormal central and dorsal spinal cord signal, nonspecific. No degenerative changes in the region. Perhaps this is sequelae of prior transverse myelitis or less likely small cord infarct. 4. No acute findings in the lumbar spine. Moderate to severe degenerative spinal stenosis at L3-L4 and L4-L5. Critical Value/emergent results were called by telephone at the time of interpretation on 11/16/2014 at 1836 hrs. to Dr. Judie PetitM. Pheiffer in the ED  who verbally acknowledged these results.  Electronically Signed: By: Augusto GambleLee  Hall M.D. On: 11/16/2014 19:13   Mr Lumbar Spine Wo Contrast  11/18/2014   CLINICAL DATA:  80 year old female who presented with back pain and progressive lower extremity weakness, found to have T10 level spinal hematoma subsequently determine to be mostly subdural on neuro surgical decompression and evacuation. Initially significant clinical improvement in symptoms, but now with lower extremity recurrent weakness. Initial encounter.  EXAM: MRI LUMBAR SPINE WITHOUT CONTRAST  TECHNIQUE: Multiplanar, multisequence MR imaging of the lumbar spine was performed. No intravenous contrast was administered.  COMPARISON:  Thoracic and lumbar spine MRI 11/16/2014.  FINDINGS: Normal lumbar segmentation noted on the comparison. Stable lumbar vertebral height and alignment.  Posterior decompression from the T11 spinous process to the L2 spinous process. There is a small percutaneous postoperative drain in place at the laminectomy site. There is patchy T2 and STIR hyperintensity in the decompression space, along with a small volume of fluid.  Within the spinal canal there is new ventral space-occupying material measuring up to 6 mm in thickness which is widespread from the T11 level tracking caudally in the ventral lumbar spinal canal as far as L5. Much of this is extremely dark on T2  and STIR imaging and isointense on T1. There is also a bright T2 and STIR component which is dark on T1 weighted imaging. Limited sagittal imaging of the more superior spine was performed in order to visualize the upper most extent of this finding, it tracks cephalad tapering to the T7 level. Subsequently there is posterior displacement of the spinal cord/thecal sac throughout the extent of the finding.  Despite the continued mass effect, compression on the spinal cord has regressed at T10-T11, but there is new/increased lumbar spinal stenosis, maximal at L3-L4 and L4-L5  (severe).  In the sacral spinal canal there is increased but small volume blood products both layering in the thecal sac and adherent to the sacral nerve roots.  There may be mild residual increased T2 spinal cord signal at T10 and T11.  Stable visualized abdominal viscera.  IMPRESSION: 1. Postoperative changes from T11-L1 with postoperative drain in place and evacuation of the lobulated T10/T11 subdural hematoma. Regressed mass effect on the spinal cord at that level, perhaps mild residual T2 cord signal abnormality. 2. However, there is now increased mostly ventral spinal canal blood products tracking throughout the lumbar spine, and tapering cephalad to the T7 level. It is unclear whether some of this space-occupying material might be gas (there is a postoperative drain in place). Study discussed by telephone with Dr. Ronaldo Miyamoto CABBELL on 11/18/2014 at 12:49 . We will follow-up with CT of the thoracic and lumbar spine to evaluate postoperative gas versus blood products.   Electronically Signed   By: Augusto Gamble M.D.   On: 11/18/2014 12:56   Mr Thoracic Spine W Wo Contrast  11/18/2014   ADDENDUM REPORT: 11/18/2014 12:58  ADDENDUM: Correction com of the first sentence of the Clinical Data section should read: "80 year old female with acute onset severe back pain after PILATES CLASS. "   Electronically Signed   By: Augusto Gamble M.D.   On: 11/18/2014 12:58   11/18/2014   CLINICAL DATA:  80 year old female with acute onset severe back pain after pole Oddi is class. Pain radiating anteriorly in the thorax, and progressing. Subsequent progressive weakness in her lower extremities, unable to walk. Current history of atrial fibrillation on Coumadin. Initial encounter.  EXAM: MRI THORACIC AND LUMBAR SPINE WITHOUT AND WITH CONTRAST  TECHNIQUE: Multiplanar and multiecho pulse sequences of the thoracic and lumbar spine were obtained without and with intravenous contrast.  CONTRAST:  10mL MULTIHANCE GADOBENATE DIMEGLUMINE 529  MG/ML IV SOLN  COMPARISON:  CT Abdomen and Pelvis without contrast 1024 hr today, and contrast-enhanced CT Abdomen and Pelvis 05/27/2010.  FINDINGS: MR THORACIC SPINE FINDINGS  Limited sagittal imaging the cervical spine remarkable for widespread chronic disc and endplate degeneration.  Thoracic vertebral height and alignment within normal limits, aside from mild anterolisthesis at T11-T12 which is stable since 2011. Benign upper thoracic vertebral body hemangiomas are suspected including at T3 where there is mild enhancement of the lesion (series 18, image 5). No marrow edema or evidence of acute osseous abnormality.  Negative visualized posterior paraspinal soft tissues.  There is a small layering left pleural effusion. Dependent pulmonary atelectasis. Negative visualized upper abdominal viscera.  No upper thoracic spinal stenosis, however, there is abnormal upper thoracic spinal cord signal at the T4 level (series 5, image 7 and series 8, image 9). This signal appears to be located centrally and may also involve the dorsal columns. No cord expansion. No associated cord enhancement. The adjacent spinal cord segments appear within normal limits. No significant degenerative changes in  this region.  Severe abnormality in the lower thoracic spine extending from the T10 to the T12 level, with a lobulated somewhat multiloculated appearing process occupying the ventral spinal canal at these levels, encompassing 9 x 14 x 55 mm (AP by transverse by CC). The abnormality is heterogeneously hyperintense on T2 imaging, hyperintense on STIR imaging, and nearly isointense to CSF on T1 precontrast imaging. However, the lesion significantly loses signal on axial gradient echo imaging. Following contrast are several curvilinear areas of enhancement, mostly along the periphery of the lesion (series 18, image 6). Precontrast images do not suggest favor this process is extradural, but intradural location is difficult to exclude.   There is associated cord compression. The cord is posteriorly displaced and compressed, maximally at T11 (series 8, image 32). There does appear to be abnormal associated cord signal.  The most distal spinal cord and conus medullaris are unaffected and appear within normal limits. No intra-axial enhancement.  No abnormality in this region was evident on the 2011 contrast-enhanced CT Abdomen and Pelvis.  MR LUMBAR SPINE FINDINGS  Lumbar segmentation is normal. Chronic L5-S1 anterolisthesis has mildly progressed since 2011. Mild retrolisthesis at L3-L4 has progressed. There are associated degenerative changes at these levels, including advanced disc and endplate degeneration at L4-L5 were there is mild marrow edema. No acute osseous abnormality identified.  There is patchy increased STIR signal in some of the posterior paraspinal soft tissues, nonspecific.  The conus medullaris is normal at L1-L2. The space-occupying mass in the lower thoracic spine appears to terminate at the T12-L1 disc space level. Cauda equina nerve roots are within normal limits, aside from the degenerative spinal stenosis findings described below.  However, there is trace layering hemorrhage at the term in a shin of the thecal sac at the S2-S3 level (series 13, image 6 and series 16, image 33).  Visualized abdominal viscera are stable since 2011.  The following degenerative changes are noted in the lumbar spine.  L2-L3: Lobulated disc bulge and severe facet hypertrophy. Mild spinal and L2 biforaminal stenosis.  L3-L4: Severe facet and ligament flavum hypertrophy with facet joint fluid. Broad-based disc bulge. Moderate spinal stenosis and right L3 foraminal stenosis.  L4-L5: Severe multifactorial spinal stenosis with right eccentric disc bulge and severe facet hypertrophy. Moderate right L4 foraminal stenosis.  L5-S1: Severe facet hypertrophy and degeneration with facet joint fluid. Grade 1 anterolisthesis at this level. Moderate ligament flavum  hypertrophy. Broad-based disc/ pseudo disc. Mild spinal stenosis. Moderate left lateral recess stenosis. Moderate left L5 foraminal stenosis.  Visible sacrum intact.  Diverticulosis of the sigmoid colon.  IMPRESSION: 1. Lower thoracic spinal cord compression with abnormal cord signal related to a lobulated roughly 1 x 1.5 x 5.5 cm space-occupying mass in the ventral spinal canal from the T10 to the T12 level. This most resembles hemorrhage/hematoma. This could be occupying the ventral epidural space (slightly favored) or intradural, and there is trace layering hemorrhage in the caudal thecal sac at S2-S3. Legrand Rams this is posttraumatic in nature rather than related to a spinal/dural AVM, however, small curvilinear areas of enhancement along the periphery of the hematoma are noted (perhaps due to active bleeding). 2.  No acute osseous abnormality identified. 3. Superimposed T4 level abnormal central and dorsal spinal cord signal, nonspecific. No degenerative changes in the region. Perhaps this is sequelae of prior transverse myelitis or less likely small cord infarct. 4. No acute findings in the lumbar spine. Moderate to severe degenerative spinal stenosis at L3-L4 and L4-L5. Critical Value/emergent  results were called by telephone at the time of interpretation on 11/16/2014 at 1836 hrs. to Dr. Judie Petit. Pheiffer in the ED who verbally acknowledged these results.  Electronically Signed: By: Augusto Gamble M.D. On: 11/16/2014 19:13    PHYSICAL EXAM  patient is stable this morning. She is oriented to person time and place. Affect is normal. Lungs are clear. Respiratory effort is not labored. Cardiac exam reveals an S1 and S2. There is no edema. She has significant orthostasis when moving from the bed to chair TELEMETRY:  I have reviewed telemetry today November 22, 2014. Her rhythm now appears to be atrial fibrillation. Rates ranged up to 110.   ASSESSMENT AND PLAN:    Paroxysmal atrial flutter (and paroxysmal atrial  fibrillation. )     Unfortunately her blood pressure will not tolerate any of the usual medications for her paroxysmal atrial flutter. I'm not inclined to start amiodarone at this point. I have chosen to add digoxin to see if this will help with her rate when she has increased rate from her flutter. Currently she cannot be anticoagulated because of the recent spontaneous hematoma. I have written orders to load her with digoxin.     Hypercholesterolemia   Paraplegia at T9 level   Weakness of both legs    Near syncope     this occurred while the patient was in the hospital related to orthostatic hypotension    Orthostatic hypotension     The patient had significant orthostatic hypotension yesterday. I have stopped all cardiac meds that could lower her blood pressure. I have started a small dose of Florinef this morning. I also spoke with the patient to make sure she understands that for her eating salt is encouraged and drinking fluids is encouraged.    Willa Rough 11/22/2014 8:08 AM

## 2014-11-22 NOTE — Clinical Social Work Note (Signed)
CSW reviewed chart. Once pt is medically stable she can transition to [facility]([place] [phone]). CSW will continue to follow and assist.   Heaven Wandell, MSW, LCSWA [phone]

## 2014-11-22 NOTE — Progress Notes (Signed)
Patient ID: [name] Weber, female   DOB: 01/16/1931, 80 y.o.   MRN: [id number] Long talk with her sister, son, daughter in law. Spoke about prognosis, etc. Continue care as per cardiology

## 2014-11-22 NOTE — Progress Notes (Signed)
Physical Therapy Treatment Patient Details Name: [name] Weber MRN: [id number] DOB: 10/27/1931 Today's Date: 11/22/2014    History of Present Illness 80 yo female s/p epidural hematoma evacuation T11-12 cord compression by Dr Jeral FruitBotero. PMH: palpitations, PAC, PVS, Myalgia,     PT Comments    Patient continues to be highly motivated but limited by orthostatics. She was incontinent of urine x3 this session and required A for standing pericare. [facility]  Follow Up Recommendations  SNF     Equipment Recommendations  None recommended by PT    Recommendations for Other Services       Precautions / Restrictions Precautions Precautions: Fall;Back Precaution Comments: hemovac, orthostatic BP Restrictions Weight Bearing Restrictions: No    Mobility  Bed Mobility Overal bed mobility: +2 for physical assistance;Needs Assistance   Rolling: Min guard         General bed mobility comments: increased effort but able to complete with rails  Transfers Overall transfer level: Needs assistance Equipment used: 2 person hand held assist   Sit to Stand: +2 physical assistance;Min assist Stand pivot transfers: Min assist;+2 physical assistance       General transfer comment: Min A to power up and steady. Patient very weak and it is evident with her standing posture and unsteadiness  Ambulation/Gait             General Gait Details: unable due to BP   Stairs            Wheelchair Mobility    Modified Rankin (Stroke Patients Only)       Balance     Sitting balance-Leahy Scale: Fair                              Cognition Arousal/Alertness: Awake/alert Behavior During Therapy: WFL for tasks assessed/performed Overall Cognitive Status: Within Functional Limits for tasks assessed                      Exercises      General Comments        Pertinent Vitals/Pain Pain  Assessment: No/denies pain    Home Living                      Prior Function            PT Goals (current goals can now be found in the care plan section) Progress towards PT goals: Progressing toward goals    Frequency  Min 3X/week    PT Plan Discharge plan needs to be updated    Co-evaluation             End of Session Equipment Utilized During Treatment: Gait belt Activity Tolerance: Other (comment);Treatment limited secondary to medical complications (Comment) (decreased BP) Patient left: in chair;with call bell/phone within reach;with family/visitor present     Time: [id number] PT Time Calculation (min) (ACUTE ONLY): 30 min  Charges:  $Therapeutic Activity: 23-37 mins                    G Codes:      Fredrich BirksRobinette, Shadia Larose Elizabeth 11/22/2014, 12:07 PM  11/22/2014 Fredrich Birksobinette, Almer Bushey Elizabeth PTA [phone] pager [phone] office

## 2014-11-22 NOTE — Progress Notes (Signed)
Patient complained of a sharp stabbing pain in hip with movement and when squeezing hands. Refused medication. MD paged. No new orders.

## 2014-11-22 NOTE — Progress Notes (Signed)
  Echocardiogram 2D Echocardiogram has been performed.  [name] Weber, [name] Weber 11/22/2014, 2:21 PM

## 2014-11-23 DIAGNOSIS — I951 Orthostatic hypotension: Secondary | ICD-10-CM

## 2014-11-23 NOTE — Progress Notes (Signed)
Patient ID: [name] Weber, female   DOB: 10/24/1931, 80 y.o.   MRN: [id number] Afeb, vss No new neuro issues Slowly regaining strength. To SNF vs CIR this coming week.

## 2014-11-23 NOTE — Progress Notes (Signed)
Patient ID: [name] Weber, female   DOB: May 05, 1931, 80 y.o.   MRN: [id number]    SUBJECTIVE:  2-D echo yesterday revealed normal left ventricular function. Ejection fraction 60%. No focal wall motion abnormalities. Yesterday I stopped diltiazem. Digoxin was started along with Florinef. 2-D echo reveals normal systolic function. Nurses report to me that blood pressure has been more stable through the night last night. Orthostatics have not yet been done today.   Filed Vitals:   11/22/14 1336 11/22/14 1812 11/22/14 2154 11/23/14 0225  BP: 106/54 122/61 123/45 115/60  Pulse: 84 94 87 91  Temp: 98 F (36.7 C) 98.1 F (36.7 C) 98.6 F (37 C) 98.6 F (37 C)  TempSrc: Oral Oral Oral Oral  Resp: 20 20 20 18   Height:      Weight:      SpO2: 99% 98% 97% 99%     Intake/Output Summary (Last 24 hours) at 11/23/14 7829 Last data filed at 11/22/14 1813  Gross per 24 hour  Intake    720 ml  Output      0 ml  Net    720 ml    LABS: Basic Metabolic Panel: No results for input(s): NA, K, CL, CO2, GLUCOSE, BUN, CREATININE, CALCIUM, MG, PHOS in the last 72 hours. Liver Function Tests: No results for input(s): AST, ALT, ALKPHOS, BILITOT, PROT, ALBUMIN in the last 72 hours. No results for input(s): LIPASE, AMYLASE in the last 72 hours. CBC: No results for input(s): WBC, NEUTROABS, HGB, HCT, MCV, PLT in the last 72 hours. Cardiac Enzymes: No results for input(s): CKTOTAL, CKMB, CKMBINDEX, TROPONINI in the last 72 hours. BNP: Invalid input(s): POCBNP D-Dimer: No results for input(s): DDIMER in the last 72 hours. Hemoglobin A1C: No results for input(s): HGBA1C in the last 72 hours. Fasting Lipid Panel: No results for input(s): CHOL, HDL, LDLCALC, TRIG, CHOLHDL, LDLDIRECT in the last 72 hours. Thyroid Function Tests: No results for input(s): TSH, T4TOTAL, T3FREE, THYROIDAB in the last 72 hours.  Invalid input(s): FREET3  RADIOLOGY: Ct Abdomen Pelvis Wo Contrast  11/16/2014   CLINICAL  DATA:  Right flank pain for 1 day with nausea and vomiting. Initial encounter.  EXAM: CT ABDOMEN AND PELVIS WITHOUT CONTRAST  TECHNIQUE: Multidetector CT imaging of the abdomen and pelvis was performed following the standard protocol without IV contrast.  COMPARISON:  05/27/2010 abdominal pelvic CT.  FINDINGS: Lower chest: Clear lung bases. No significant pleural or pericardial effusion.  Hepatobiliary: As evaluated in the noncontrast state, the liver appears normal without focal abnormality. No evidence of gallstones, gallbladder wall thickening or biliary dilatation.  Pancreas: Unremarkable. No pancreatic ductal dilatation or surrounding inflammatory changes.  Spleen: Normal in size without focal abnormality.  Adrenals/Urinary Tract: Both adrenal glands appear normal.There is no evidence of urinary tract calculus or hydronephrosis. This is small parenchymal calcification in the upper pole of the left kidney. A simple cyst posteriorly in the left kidney appears stable. The distal ureters are not well visualized, but no ureteral or bladder calculi are seen.  Stomach/Bowel: No evidence of bowel wall thickening, distention or surrounding inflammatory change.Mild distal colonic diverticular changes are present.  Vascular/Lymphatic: There are no enlarged abdominal or pelvic lymph nodes. There is diffuse aortoiliac atherosclerosis.  Reproductive: Status post hysterectomy. No evidence of adnexal mass.  Other: No evidence of abdominal wall mass or hernia.  Musculoskeletal: No acute or significant osseous findings. There are degenerative changes throughout the lumbar spine associated with a convex left scoliosis.  IMPRESSION: IMPRESSION  1. No evidence of urinary tract calculus or hydronephrosis. 2. No acute findings identified. 3. Diffuse atherosclerosis and mild distal colonic diverticulosis noted.   Electronically Signed   By: Roxy HorsemanBill  Veazey M.D.   On: 11/16/2014 10:43   Dg Chest 2 View  11/13/2014   CLINICAL DATA:   Recent injury while working out with difficulty breathing and right-sided chest pain  EXAM: CHEST  2 VIEW  COMPARISON:  None.  FINDINGS: Cardiac shadow is within normal limits. The lungs are hyperinflated consistent with COPD. No pneumothorax or sizable effusion is seen. No focal infiltrate is noted.  IMPRESSION: COPD without acute abnormality.   Electronically Signed   By: Alcide CleverMark  Lukens M.D.   On: 11/13/2014 12:09   Ct Thoracic Spine Wo Contrast  11/18/2014   CLINICAL DATA:  80 year old female who presented with back pain and progressive lower extremity weakness, found to have T10 level spinal hematoma subsequently determined to be mostly subdural on neurosurgical decompression and evacuation. Initially significantclinical improvement in symptoms, but now with lower extremity recurrent weakness. Postoperative lumbar MRI with increased ventral spinal, but possibly superimposed on a significant amount of intraspinal gas. Further evaluation of that finding with CT. Initial encounter.  EXAM: CT THORACIC AND LUMBAR SPINE WITHOUT CONTRAST  TECHNIQUE: Multidetector CT imaging of the thoracic and lumbar spine was performed without contrast. Multiplanar CT image reconstructions were also generated.  COMPARISON:  Postoperative lumbar MRI today at 1138 hrs. Preoperative thoracic and lumbar MRI 11/16/2014.  FINDINGS: CT THORACIC SPINE FINDINGS  Posterior element postoperative changes from the T11 spinous process continuing into the upper lumbar spine. There is a percutaneous postoperative drain in place, extending deep into the laminectomy space and terminating along the left L2 spinous process. There is a small volume of superficial and laminectomy space postoperative gas.  With regard to the dark T2/stir signal seen along the ventral spinal canal in the lower thoracic and lumbar spine, there is only a small volume of gas present in the ventral canal posterior to the L1 vertebral body, and at the T12-L1 disc space. There  is vacuum disc phenomena at T12-L1. No other gas within the thoracic spinal canal. With narrow CT windows, the ventral spinal canal density is seen to be hyperdense to CSF compatible with blood products (see series 207, image 19 and axial images 122 and 153 of series 202). As noted on the recent MRI this tapers cephalad to the T7 level.  Moderate size layering bilateral pleural effusions. Compressive atelectasis in both lungs, but more confluent left upper lobe opacity (series 201, image 43).  Calcified atherosclerosis of the thoracic aorta.  CT LUMBAR SPINE FINDINGS  Stable lumbar vertebral height and alignment.  Postoperative changes to the posterior elements from the lower thoracic spine continuing to the L1 lamina. Postoperative drain in place as detailed above.  As stated above there is only a small volume of gas in the ventral spinal canal at the L1 level, and also along the right lateral aspect of the right L1-L2 facet. Therefore, the bulk of the space-occupying process seen on the postoperative MRI is blood products. With narrow CT windows, hyperdense blood products are visible in the ventral canal, and more circumferentially in the lower lumbar spine (e.g. L3-L4 series 202, image 65).  Small volume superficial postoperative gas in the subcutaneous fat. Subcutaneous edema. Skin staples in place.  Aortoiliac calcified atherosclerosis noted. Visible sacrum and SI joints intact.  IMPRESSION: 1. Only trace gas within the spinal canal, limited to the L1  level. Therefore, the (mostly ventral) space-occupying process seen by lumbar MRI earlier today tracking from the lumbar spine cephalad and tapering at the T7 level is blood products. 2. Postoperative changes to the posterior elements T11 through L1. Laminectomy space drain in place. 3. Moderate bilateral layering pleural effusions. Compressive atelectasis in both lungs, but more confluent left upper lobe opacity suspicious for developing pneumonia.    Electronically Signed   By: Augusto Gamble M.D.   On: 11/18/2014 14:10   Ct Lumbar Spine Wo Contrast  11/18/2014   CLINICAL DATA:  80 year old female who presented with back pain and progressive lower extremity weakness, found to have T10 level spinal hematoma subsequently determined to be mostly subdural on neurosurgical decompression and evacuation. Initially significantclinical improvement in symptoms, but now with lower extremity recurrent weakness. Postoperative lumbar MRI with increased ventral spinal, but possibly superimposed on a significant amount of intraspinal gas. Further evaluation of that finding with CT. Initial encounter.  EXAM: CT THORACIC AND LUMBAR SPINE WITHOUT CONTRAST  TECHNIQUE: Multidetector CT imaging of the thoracic and lumbar spine was performed without contrast. Multiplanar CT image reconstructions were also generated.  COMPARISON:  Postoperative lumbar MRI today at 1138 hrs. Preoperative thoracic and lumbar MRI 11/16/2014.  FINDINGS: CT THORACIC SPINE FINDINGS  Posterior element postoperative changes from the T11 spinous process continuing into the upper lumbar spine. There is a percutaneous postoperative drain in place, extending deep into the laminectomy space and terminating along the left L2 spinous process. There is a small volume of superficial and laminectomy space postoperative gas.  With regard to the dark T2/stir signal seen along the ventral spinal canal in the lower thoracic and lumbar spine, there is only a small volume of gas present in the ventral canal posterior to the L1 vertebral body, and at the T12-L1 disc space. There is vacuum disc phenomena at T12-L1. No other gas within the thoracic spinal canal. With narrow CT windows, the ventral spinal canal density is seen to be hyperdense to CSF compatible with blood products (see series 207, image 19 and axial images 122 and 153 of series 202). As noted on the recent MRI this tapers cephalad to the T7 level.  Moderate size  layering bilateral pleural effusions. Compressive atelectasis in both lungs, but more confluent left upper lobe opacity (series 201, image 43).  Calcified atherosclerosis of the thoracic aorta.  CT LUMBAR SPINE FINDINGS  Stable lumbar vertebral height and alignment.  Postoperative changes to the posterior elements from the lower thoracic spine continuing to the L1 lamina. Postoperative drain in place as detailed above.  As stated above there is only a small volume of gas in the ventral spinal canal at the L1 level, and also along the right lateral aspect of the right L1-L2 facet. Therefore, the bulk of the space-occupying process seen on the postoperative MRI is blood products. With narrow CT windows, hyperdense blood products are visible in the ventral canal, and more circumferentially in the lower lumbar spine (e.g. L3-L4 series 202, image 65).  Small volume superficial postoperative gas in the subcutaneous fat. Subcutaneous edema. Skin staples in place.  Aortoiliac calcified atherosclerosis noted. Visible sacrum and SI joints intact.  IMPRESSION: 1. Only trace gas within the spinal canal, limited to the L1 level. Therefore, the (mostly ventral) space-occupying process seen by lumbar MRI earlier today tracking from the lumbar spine cephalad and tapering at the T7 level is blood products. 2. Postoperative changes to the posterior elements T11 through L1. Laminectomy space drain in  place. 3. Moderate bilateral layering pleural effusions. Compressive atelectasis in both lungs, but more confluent left upper lobe opacity suspicious for developing pneumonia.   Electronically Signed   By: Augusto GambleLee  Hall M.D.   On: 11/18/2014 14:10   Mr Lumbar Spine Wo Contrast  11/18/2014   ADDENDUM REPORT: 11/18/2014 12:58  ADDENDUM: Correction com of the first sentence of the Clinical Data section should read: "80 year old female with acute onset severe back pain after PILATES CLASS. "   Electronically Signed   By: Augusto GambleLee  Hall M.D.   On:  11/18/2014 12:58   11/18/2014   CLINICAL DATA:  80 year old female with acute onset severe back pain after pole Oddi is class. Pain radiating anteriorly in the thorax, and progressing. Subsequent progressive weakness in her lower extremities, unable to walk. Current history of atrial fibrillation on Coumadin. Initial encounter.  EXAM: MRI THORACIC AND LUMBAR SPINE WITHOUT AND WITH CONTRAST  TECHNIQUE: Multiplanar and multiecho pulse sequences of the thoracic and lumbar spine were obtained without and with intravenous contrast.  CONTRAST:  10mL MULTIHANCE GADOBENATE DIMEGLUMINE 529 MG/ML IV SOLN  COMPARISON:  CT Abdomen and Pelvis without contrast 1024 hr today, and contrast-enhanced CT Abdomen and Pelvis 05/27/2010.  FINDINGS: MR THORACIC SPINE FINDINGS  Limited sagittal imaging the cervical spine remarkable for widespread chronic disc and endplate degeneration.  Thoracic vertebral height and alignment within normal limits, aside from mild anterolisthesis at T11-T12 which is stable since 2011. Benign upper thoracic vertebral body hemangiomas are suspected including at T3 where there is mild enhancement of the lesion (series 18, image 5). No marrow edema or evidence of acute osseous abnormality.  Negative visualized posterior paraspinal soft tissues.  There is a small layering left pleural effusion. Dependent pulmonary atelectasis. Negative visualized upper abdominal viscera.  No upper thoracic spinal stenosis, however, there is abnormal upper thoracic spinal cord signal at the T4 level (series 5, image 7 and series 8, image 9). This signal appears to be located centrally and may also involve the dorsal columns. No cord expansion. No associated cord enhancement. The adjacent spinal cord segments appear within normal limits. No significant degenerative changes in this region.  Severe abnormality in the lower thoracic spine extending from the T10 to the T12 level, with a lobulated somewhat multiloculated appearing  process occupying the ventral spinal canal at these levels, encompassing 9 x 14 x 55 mm (AP by transverse by CC). The abnormality is heterogeneously hyperintense on T2 imaging, hyperintense on STIR imaging, and nearly isointense to CSF on T1 precontrast imaging. However, the lesion significantly loses signal on axial gradient echo imaging. Following contrast are several curvilinear areas of enhancement, mostly along the periphery of the lesion (series 18, image 6). Precontrast images do not suggest favor this process is extradural, but intradural location is difficult to exclude.  There is associated cord compression. The cord is posteriorly displaced and compressed, maximally at T11 (series 8, image 32). There does appear to be abnormal associated cord signal.  The most distal spinal cord and conus medullaris are unaffected and appear within normal limits. No intra-axial enhancement.  No abnormality in this region was evident on the 2011 contrast-enhanced CT Abdomen and Pelvis.  MR LUMBAR SPINE FINDINGS  Lumbar segmentation is normal. Chronic L5-S1 anterolisthesis has mildly progressed since 2011. Mild retrolisthesis at L3-L4 has progressed. There are associated degenerative changes at these levels, including advanced disc and endplate degeneration at L4-L5 were there is mild marrow edema. No acute osseous abnormality identified.  There is patchy  increased STIR signal in some of the posterior paraspinal soft tissues, nonspecific.  The conus medullaris is normal at L1-L2. The space-occupying mass in the lower thoracic spine appears to terminate at the T12-L1 disc space level. Cauda equina nerve roots are within normal limits, aside from the degenerative spinal stenosis findings described below.  However, there is trace layering hemorrhage at the term in a shin of the thecal sac at the S2-S3 level (series 13, image 6 and series 16, image 33).  Visualized abdominal viscera are stable since 2011.  The following  degenerative changes are noted in the lumbar spine.  L2-L3: Lobulated disc bulge and severe facet hypertrophy. Mild spinal and L2 biforaminal stenosis.  L3-L4: Severe facet and ligament flavum hypertrophy with facet joint fluid. Broad-based disc bulge. Moderate spinal stenosis and right L3 foraminal stenosis.  L4-L5: Severe multifactorial spinal stenosis with right eccentric disc bulge and severe facet hypertrophy. Moderate right L4 foraminal stenosis.  L5-S1: Severe facet hypertrophy and degeneration with facet joint fluid. Grade 1 anterolisthesis at this level. Moderate ligament flavum hypertrophy. Broad-based disc/ pseudo disc. Mild spinal stenosis. Moderate left lateral recess stenosis. Moderate left L5 foraminal stenosis.  Visible sacrum intact.  Diverticulosis of the sigmoid colon.  IMPRESSION: 1. Lower thoracic spinal cord compression with abnormal cord signal related to a lobulated roughly 1 x 1.5 x 5.5 cm space-occupying mass in the ventral spinal canal from the T10 to the T12 level. This most resembles hemorrhage/hematoma. This could be occupying the ventral epidural space (slightly favored) or intradural, and there is trace layering hemorrhage in the caudal thecal sac at S2-S3. Legrand Rams this is posttraumatic in nature rather than related to a spinal/dural AVM, however, small curvilinear areas of enhancement along the periphery of the hematoma are noted (perhaps due to active bleeding). 2.  No acute osseous abnormality identified. 3. Superimposed T4 level abnormal central and dorsal spinal cord signal, nonspecific. No degenerative changes in the region. Perhaps this is sequelae of prior transverse myelitis or less likely small cord infarct. 4. No acute findings in the lumbar spine. Moderate to severe degenerative spinal stenosis at L3-L4 and L4-L5. Critical Value/emergent results were called by telephone at the time of interpretation on 11/16/2014 at 1836 hrs. to Dr. Judie Petit. Pheiffer in the ED who verbally  acknowledged these results.  Electronically Signed: By: Augusto Gamble M.D. On: 11/16/2014 19:13   Mr Lumbar Spine Wo Contrast  11/18/2014   CLINICAL DATA:  80 year old female who presented with back pain and progressive lower extremity weakness, found to have T10 level spinal hematoma subsequently determine to be mostly subdural on neuro surgical decompression and evacuation. Initially significant clinical improvement in symptoms, but now with lower extremity recurrent weakness. Initial encounter.  EXAM: MRI LUMBAR SPINE WITHOUT CONTRAST  TECHNIQUE: Multiplanar, multisequence MR imaging of the lumbar spine was performed. No intravenous contrast was administered.  COMPARISON:  Thoracic and lumbar spine MRI 11/16/2014.  FINDINGS: Normal lumbar segmentation noted on the comparison. Stable lumbar vertebral height and alignment.  Posterior decompression from the T11 spinous process to the L2 spinous process. There is a small percutaneous postoperative drain in place at the laminectomy site. There is patchy T2 and STIR hyperintensity in the decompression space, along with a small volume of fluid.  Within the spinal canal there is new ventral space-occupying material measuring up to 6 mm in thickness which is widespread from the T11 level tracking caudally in the ventral lumbar spinal canal as far as L5. Much of this is extremely  dark on T2 and STIR imaging and isointense on T1. There is also a bright T2 and STIR component which is dark on T1 weighted imaging. Limited sagittal imaging of the more superior spine was performed in order to visualize the upper most extent of this finding, it tracks cephalad tapering to the T7 level. Subsequently there is posterior displacement of the spinal cord/thecal sac throughout the extent of the finding.  Despite the continued mass effect, compression on the spinal cord has regressed at T10-T11, but there is new/increased lumbar spinal stenosis, maximal at L3-L4 and L4-L5 (severe).  In  the sacral spinal canal there is increased but small volume blood products both layering in the thecal sac and adherent to the sacral nerve roots.  There may be mild residual increased T2 spinal cord signal at T10 and T11.  Stable visualized abdominal viscera.  IMPRESSION: 1. Postoperative changes from T11-L1 with postoperative drain in place and evacuation of the lobulated T10/T11 subdural hematoma. Regressed mass effect on the spinal cord at that level, perhaps mild residual T2 cord signal abnormality. 2. However, there is now increased mostly ventral spinal canal blood products tracking throughout the lumbar spine, and tapering cephalad to the T7 level. It is unclear whether some of this space-occupying material might be gas (there is a postoperative drain in place). Study discussed by telephone with Dr. Ronaldo Miyamoto CABBELL on 11/18/2014 at 12:49 . We will follow-up with CT of the thoracic and lumbar spine to evaluate postoperative gas versus blood products.   Electronically Signed   By: Augusto Gamble M.D.   On: 11/18/2014 12:56   Mr Thoracic Spine W Wo Contrast  11/18/2014   ADDENDUM REPORT: 11/18/2014 12:58  ADDENDUM: Correction com of the first sentence of the Clinical Data section should read: "80 year old female with acute onset severe back pain after PILATES CLASS. "   Electronically Signed   By: Augusto Gamble M.D.   On: 11/18/2014 12:58   11/18/2014   CLINICAL DATA:  80 year old female with acute onset severe back pain after pole Oddi is class. Pain radiating anteriorly in the thorax, and progressing. Subsequent progressive weakness in her lower extremities, unable to walk. Current history of atrial fibrillation on Coumadin. Initial encounter.  EXAM: MRI THORACIC AND LUMBAR SPINE WITHOUT AND WITH CONTRAST  TECHNIQUE: Multiplanar and multiecho pulse sequences of the thoracic and lumbar spine were obtained without and with intravenous contrast.  CONTRAST:  10mL MULTIHANCE GADOBENATE DIMEGLUMINE 529 MG/ML IV SOLN   COMPARISON:  CT Abdomen and Pelvis without contrast 1024 hr today, and contrast-enhanced CT Abdomen and Pelvis 05/27/2010.  FINDINGS: MR THORACIC SPINE FINDINGS  Limited sagittal imaging the cervical spine remarkable for widespread chronic disc and endplate degeneration.  Thoracic vertebral height and alignment within normal limits, aside from mild anterolisthesis at T11-T12 which is stable since 2011. Benign upper thoracic vertebral body hemangiomas are suspected including at T3 where there is mild enhancement of the lesion (series 18, image 5). No marrow edema or evidence of acute osseous abnormality.  Negative visualized posterior paraspinal soft tissues.  There is a small layering left pleural effusion. Dependent pulmonary atelectasis. Negative visualized upper abdominal viscera.  No upper thoracic spinal stenosis, however, there is abnormal upper thoracic spinal cord signal at the T4 level (series 5, image 7 and series 8, image 9). This signal appears to be located centrally and may also involve the dorsal columns. No cord expansion. No associated cord enhancement. The adjacent spinal cord segments appear within normal limits. No significant  degenerative changes in this region.  Severe abnormality in the lower thoracic spine extending from the T10 to the T12 level, with a lobulated somewhat multiloculated appearing process occupying the ventral spinal canal at these levels, encompassing 9 x 14 x 55 mm (AP by transverse by CC). The abnormality is heterogeneously hyperintense on T2 imaging, hyperintense on STIR imaging, and nearly isointense to CSF on T1 precontrast imaging. However, the lesion significantly loses signal on axial gradient echo imaging. Following contrast are several curvilinear areas of enhancement, mostly along the periphery of the lesion (series 18, image 6). Precontrast images do not suggest favor this process is extradural, but intradural location is difficult to exclude.  There is associated  cord compression. The cord is posteriorly displaced and compressed, maximally at T11 (series 8, image 32). There does appear to be abnormal associated cord signal.  The most distal spinal cord and conus medullaris are unaffected and appear within normal limits. No intra-axial enhancement.  No abnormality in this region was evident on the 2011 contrast-enhanced CT Abdomen and Pelvis.  MR LUMBAR SPINE FINDINGS  Lumbar segmentation is normal. Chronic L5-S1 anterolisthesis has mildly progressed since 2011. Mild retrolisthesis at L3-L4 has progressed. There are associated degenerative changes at these levels, including advanced disc and endplate degeneration at L4-L5 were there is mild marrow edema. No acute osseous abnormality identified.  There is patchy increased STIR signal in some of the posterior paraspinal soft tissues, nonspecific.  The conus medullaris is normal at L1-L2. The space-occupying mass in the lower thoracic spine appears to terminate at the T12-L1 disc space level. Cauda equina nerve roots are within normal limits, aside from the degenerative spinal stenosis findings described below.  However, there is trace layering hemorrhage at the term in a shin of the thecal sac at the S2-S3 level (series 13, image 6 and series 16, image 33).  Visualized abdominal viscera are stable since 2011.  The following degenerative changes are noted in the lumbar spine.  L2-L3: Lobulated disc bulge and severe facet hypertrophy. Mild spinal and L2 biforaminal stenosis.  L3-L4: Severe facet and ligament flavum hypertrophy with facet joint fluid. Broad-based disc bulge. Moderate spinal stenosis and right L3 foraminal stenosis.  L4-L5: Severe multifactorial spinal stenosis with right eccentric disc bulge and severe facet hypertrophy. Moderate right L4 foraminal stenosis.  L5-S1: Severe facet hypertrophy and degeneration with facet joint fluid. Grade 1 anterolisthesis at this level. Moderate ligament flavum hypertrophy.  Broad-based disc/ pseudo disc. Mild spinal stenosis. Moderate left lateral recess stenosis. Moderate left L5 foraminal stenosis.  Visible sacrum intact.  Diverticulosis of the sigmoid colon.  IMPRESSION: 1. Lower thoracic spinal cord compression with abnormal cord signal related to a lobulated roughly 1 x 1.5 x 5.5 cm space-occupying mass in the ventral spinal canal from the T10 to the T12 level. This most resembles hemorrhage/hematoma. This could be occupying the ventral epidural space (slightly favored) or intradural, and there is trace layering hemorrhage in the caudal thecal sac at S2-S3. Legrand Rams this is posttraumatic in nature rather than related to a spinal/dural AVM, however, small curvilinear areas of enhancement along the periphery of the hematoma are noted (perhaps due to active bleeding). 2.  No acute osseous abnormality identified. 3. Superimposed T4 level abnormal central and dorsal spinal cord signal, nonspecific. No degenerative changes in the region. Perhaps this is sequelae of prior transverse myelitis or less likely small cord infarct. 4. No acute findings in the lumbar spine. Moderate to severe degenerative spinal stenosis at L3-L4 and  L4-L5. Critical Value/emergent results were called by telephone at the time of interpretation on 11/16/2014 at 1836 hrs. to Dr. Judie Petit. Pheiffer in the ED who verbally acknowledged these results.  Electronically Signed: By: Augusto Gamble M.D. On: 11/16/2014 19:13      TELEMETRY:  I reviewed telemetry today November 23, 2014. The rhythm now is atrial fibrillation. The rate is controlled.   ASSESSMENT AND PLAN:    Paroxysmal atrial flutter / atrial fibrillation     The only medicine that the patient is on now for rate control as digoxin. Her blood pressure will not tolerate any other medicines. I've chosen not to start amiodarone at this time. So far we are getting good control with the digoxin load.    Hypercholesterolemia   Paraplegia at T9 level   Weakness of  both legs    Near syncope   Orthostatic hypotension     For significant orthostatic hypotension here in the hospital, I have stopped all cardiac meds that could lower the blood pressure. I am loading the patient with digoxin for rate control. I started Florinef. The patient has good left ventricular systolic function. Hopefully she will not develop problems with volume overload. I have spoken to her about encouraging salt and fluid intake. Support hose on the lower extremities is usually recommended with this type of problem. I do not know if that will help her at this time.   Willa Rough 11/23/2014 6:08 AM

## 2014-11-24 NOTE — Progress Notes (Signed)
Subjective: Patient resting in bed, has some intermittent right lower thoracic radicular pain. Reports limited mobility, due to blood pressure decreases when up.  Objective: Vital signs in last 24 hours: Filed Vitals:   11/23/14 2145 11/24/14 0130 11/24/14 0608 11/24/14 1022  BP: 106/53 114/66 139/64 121/68  Pulse: 92 45 93 84  Temp: 98.4 F (36.9 C) 98.2 F (36.8 C) 98.1 F (36.7 C) 97.7 F (36.5 C)  TempSrc: Oral Oral Oral Oral  Resp: 18 18 18 16   Height:      Weight:      SpO2: 99% 97% 99% 98%    Intake/Output from previous day: 12/25 0701 - 12/26 0700 In: 3 [I.V.:3] Out: -  Intake/Output this shift:    Physical Exam:  Awake alert, oriented. Lower extremities paretic.   Assessment/Plan: Continued gradual recovery. Orthostasis has limited progress. Continues to be followed by cardiology.   Hewitt ShortsNUDELMAN,ROBERT W, MD 11/24/2014, 10:24 AM

## 2014-11-24 NOTE — Progress Notes (Signed)
Patient ID: Stephani Policevelyn C Burdo, female   DOB: 05/20/1931, 80 y.o.   MRN: [id number]    SUBJECTIVE:  Denies CP or dyspnea.   Filed Vitals:   11/23/14 1749 11/23/14 2145 11/24/14 0130 11/24/14 0608  BP: 109/70 106/53 114/66 139/64  Pulse: 90 92 45 93  Temp: 98.6 F (37 C) 98.4 F (36.9 C) 98.2 F (36.8 C) 98.1 F (36.7 C)  TempSrc: Oral Oral Oral Oral  Resp: 18 18 18 18   Height:      Weight:      SpO2: 96% 99% 97% 99%     Intake/Output Summary (Last 24 hours) at 11/24/14 [id number] Last data filed at 11/23/14 2200  Gross per 24 hour  Intake      3 ml  Output      0 ml  Net      3 ml      TELEMETRY: Patient in atrial fibrillation; rate controlled  Stephani Policevelyn C Croghan is a 80 y.o. female with a history of atrial fibrillation on coumadin, HLD and no other real significant medical history who [facility] with abrupt onset of new low back pain and weakness and found to have a spinal hemorrhage/hematoma in the ventral spinal canal causing spinal chord compression. Patient had laminectomy and decompression of the epidural space with removal of subdural hematoma and decompression of the cord on December 19. Dr. Myrtis SerKatz has been following her. She has had difficulties with orthostatic hypotension and all of her AV nodal blocking agents have been discontinued, digoxin added and Florinef added as well.  ASSESSMENT AND PLAN:    Paroxysmal atrial flutter / atrial fibrillation     The patient remains in atrial fibrillation on telemetry. Her rate appears to be controlled on digoxin alone. We will continue. Her hematoma occurred with an INR of 3.1. We will need to avoid anticoagulation at this point.    Orthostatic hypotension    Significant orthostatic hypotension here in the hospital; Dr Myrtis SerKatz stopped all cardiac meds that could lower the blood pressure and  loaded with digoxin for rate control; started Florinef. BP improved; follow.    S/P back surgery as outlined above-management per  neurosurgery.   Olga MillersBrian Redell Nazir 11/24/2014 9:53 AM

## 2014-11-24 NOTE — Progress Notes (Addendum)
Family of the patient wanting update regarding last few days of care. MD paged and returned call to writer stating writer to update. " Patient progress is slow because of orthostasis, and cardiology is seeing her to assist with that".

## 2014-11-25 LAB — BASIC METABOLIC PANEL
Anion gap: 5 (ref 5–15)
BUN: 17 mg/dL (ref 6–23)
CO2: 30 mmol/L (ref 19–32)
Calcium: 8.1 mg/dL — ABNORMAL LOW (ref 8.4–10.5)
Chloride: 91 mEq/L — ABNORMAL LOW (ref 96–112)
Creatinine, Ser: 0.63 mg/dL (ref 0.50–1.10)
GFR calc Af Amer: >90 mL/min (ref >90)
GFR calc non Af Amer: 82 mL/min — ABNORMAL LOW (ref >90)
Glucose, Bld: 123 mg/dL — ABNORMAL HIGH (ref 70–99)
Potassium: 4 mmol/L (ref 3.5–5.1)
Sodium: 126 mmol/L — ABNORMAL LOW (ref 135–145)

## 2014-11-25 LAB — CBC
HCT: 35.8 % — ABNORMAL LOW (ref 36.0–46.0)
Hemoglobin: 12.2 g/dL (ref 12.0–15.0)
MCH: 32.4 pg (ref 26.0–34.0)
MCHC: 34.1 g/dL (ref 30.0–36.0)
MCV: 95 fL (ref 78.0–100.0)
Platelets: 187 10*3/uL (ref 150–400)
RBC: 3.77 MIL/uL — ABNORMAL LOW (ref 3.87–5.11)
RDW: 11.5 % (ref 11.5–15.5)
WBC: 8 10*3/uL (ref 4.0–10.5)

## 2014-11-25 MED ORDER — SODIUM CHLORIDE 1 G PO TABS
1.0000 g | ORAL_TABLET | Freq: Three times a day (TID) | ORAL | Status: DC
  Administered 2014-11-25 – 2014-11-29 (×11): 1 g via ORAL
  Filled 2014-11-25 (×16): qty 1

## 2014-11-25 MED ORDER — SODIUM CHLORIDE 0.9 % IV SOLN
INTRAVENOUS | Status: DC
  Administered 2014-11-25 – 2014-11-28 (×5): via INTRAVENOUS

## 2014-11-25 NOTE — Progress Notes (Signed)
No issues overnight. Pt reports right-sided thoracic pain and nausea to RN, but did not mention this to me.  EXAM:  BP 116/57 mmHg  Pulse 70  Temp(Src) 97.6 F (36.4 C) (Oral)  Resp 20  Ht 5\' 6"  (1.676 m)  Wt 59.8 kg (131 lb 13.4 oz)  BMI 21.29 kg/m2  SpO2 100%  Awake, alert, oriented  Speech fluent, appropriate  CN grossly intact  5/5 BUE 4-/5 bilateral hip flexor 2/5 bilateral Quad 4/5 bilateral PF/DF  IMPRESSION:  80 y.o. female s/p evacuation of thoracic hematoma likely related to coumadin, making remarkable neurologic recovery - Orthostatic hypotension  PLAN: - Cont to mobilize, plan on d/c to SNF when orthostatics are better - Pt being followed by cardiology for orthostatic hypotension. Off  Anti-hypertensives, on fluorinef, getting IVF bolus. Can check CBC/lytes

## 2014-11-25 NOTE — Progress Notes (Addendum)
Attempted to transfer patient in the form of stand/pivot to the chair.  Pt was unable to stand beside the bed due to severe lower back/right hip pain and weakness of the lower extremeties.

## 2014-11-25 NOTE — Progress Notes (Signed)
Pts sister Vi called expressing concern for pts physical status as well as what she called a lack of communication between physicians and pts family members.  I reassured family member that I would work with patient tonight on progressive ambulation and would address communication concerns with the morning nurse as well as make a note for the physician(s).

## 2014-11-25 NOTE — Progress Notes (Addendum)
Patient ID: [name] Weber, female   DOB: 10/10/1931, 80 y.o.   MRN: [id number]    SUBJECTIVE:  Denies CP or dyspnea. Complains of back pain and nausea.   Filed Vitals:   11/24/14 2223 11/25/14 0200 11/25/14 0628 11/25/14 0922  BP: 136/80 147/80 129/72 121/79  Pulse: 85 89 90 91  Temp: 97.5 F (36.4 C) 98.2 F (36.8 C) 97.8 F (36.6 C) 97.6 F (36.4 C)  TempSrc: Oral Oral Oral Oral  Resp: 24 20 20 20   Height:      Weight:      SpO2: 98% 99% 100% 100%     Intake/Output Summary (Last 24 hours) at 11/25/14 1002 Last data filed at 11/24/14 2230  Gross per 24 hour  Intake      3 ml  Output      0 ml  Net      3 ml    PE 121/79, 97.6, 91 WD/frail, NAD HEENT normal Neck supple  Chest CTA CV irregular Abd soft Ext no edema  TELEMETRY: Patient in atrial fibrillation; rate controlled  [name] Weber is a 80 y.o. female with a history of atrial fibrillation on coumadin, HLD and no other real significant medical history who [facility] abrupt onset of new low back pain and weakness and found to have a spinal hemorrhage/hematoma in the ventral spinal canal causing spinal chord compression. Patient had laminectomy and decompression of the epidural space with removal of subdural hematoma and decompression of the cord on December 19. Dr. Myrtis SerKatz has been following her. She has had difficulties with orthostatic hypotension and all of her AV nodal blocking agents have been discontinued, digoxin added and Florinef added as well.  ASSESSMENT AND PLAN:    Paroxysmal atrial flutter / atrial fibrillation     The patient remains in atrial fibrillation on telemetry. Her rate appears to be controlled on digoxin alone. We will continue. Her hematoma occurred with an INR of 3.1. We will need to avoid anticoagulation at this point. Would like to begin ASA when ok with neurosurgery.    Orthostatic hypotension    Significant orthostatic hypotension here in the hospital by report; Dr  Myrtis SerKatz stopped all cardiac meds that could lower the blood pressure and  loaded with digoxin for rate control; started Florinef. BP improved; there is still concern for orthostasis limiting recovery and physical therapy. Need to document orthostatic vitals. Continue increased PO fluid intake.    S/P back surgery as outlined above-Patient apparently with significant pain with standing. Management per neurosurgery.   Long discussion with patient's family; they have multiple concerns concerning physical therapy, slow recovery, nausea, ? Infection. I have asked them to discuss with neurosurgery. 30 min spent today.  Olga MillersBrian Crenshaw 11/25/2014 10:02 AM  Patient continues to be orthostatic this AM 135/60 lying to 94/62 standing. Will continue florinef; begin IVFs; recheck Hgb and renal function in AM. Olga MillersBrian Crenshaw

## 2014-11-26 ENCOUNTER — Inpatient Hospital Stay (HOSPITAL_COMMUNITY): Payer: Medicare Other

## 2014-11-26 DIAGNOSIS — I481 Persistent atrial fibrillation: Secondary | ICD-10-CM

## 2014-11-26 LAB — BASIC METABOLIC PANEL
Anion gap: 5 (ref 5–15)
BUN: 12 mg/dL (ref 6–23)
CO2: 27 mmol/L (ref 19–32)
Calcium: 8 mg/dL — ABNORMAL LOW (ref 8.4–10.5)
Chloride: 99 mEq/L (ref 96–112)
Creatinine, Ser: 0.52 mg/dL (ref 0.50–1.10)
GFR calc Af Amer: >90 mL/min (ref >90)
GFR calc non Af Amer: 87 mL/min — ABNORMAL LOW (ref >90)
Glucose, Bld: 133 mg/dL — ABNORMAL HIGH (ref 70–99)
Potassium: 3.8 mmol/L (ref 3.5–5.1)
Sodium: 131 mmol/L — ABNORMAL LOW (ref 135–145)

## 2014-11-26 LAB — CBC
HCT: 34.4 % — ABNORMAL LOW (ref 36.0–46.0)
Hemoglobin: 11.7 g/dL — ABNORMAL LOW (ref 12.0–15.0)
MCH: 32.9 pg (ref 26.0–34.0)
MCHC: 34 g/dL (ref 30.0–36.0)
MCV: 96.6 fL (ref 78.0–100.0)
Platelets: 193 10*3/uL (ref 150–400)
RBC: 3.56 MIL/uL — ABNORMAL LOW (ref 3.87–5.11)
RDW: 11.7 % (ref 11.5–15.5)
WBC: 5.9 10*3/uL (ref 4.0–10.5)

## 2014-11-26 LAB — DIFFERENTIAL
Basophils Absolute: 0 10*3/uL (ref 0.0–0.1)
Basophils Relative: 0 % (ref 0–1)
Eosinophils Absolute: 0.1 10*3/uL (ref 0.0–0.7)
Eosinophils Relative: 1 % (ref 0–5)
Lymphocytes Relative: 11 % — ABNORMAL LOW (ref 12–46)
Lymphs Abs: 0.6 10*3/uL — ABNORMAL LOW (ref 0.7–4.0)
Monocytes Absolute: 0.9 10*3/uL (ref 0.1–1.0)
Monocytes Relative: 16 % — ABNORMAL HIGH (ref 3–12)
Neutro Abs: 4.3 10*3/uL (ref 1.7–7.7)
Neutrophils Relative %: 72 % (ref 43–77)

## 2014-11-26 MED ORDER — IOHEXOL 300 MG/ML  SOLN
25.0000 mL | INTRAMUSCULAR | Status: AC
  Administered 2014-11-26 (×2): 25 mL via ORAL

## 2014-11-26 MED ORDER — ENSURE COMPLETE PO LIQD
237.0000 mL | ORAL | Status: DC
Start: 2014-11-26 — End: 2014-11-29
  Administered 2014-11-26 – 2014-11-28 (×3): 237 mL via ORAL

## 2014-11-26 MED ORDER — IOHEXOL 300 MG/ML  SOLN
100.0000 mL | Freq: Once | INTRAMUSCULAR | Status: AC | PRN
  Administered 2014-11-26: 100 mL via INTRAVENOUS

## 2014-11-26 MED ORDER — ADULT MULTIVITAMIN W/MINERALS CH
1.0000 | ORAL_TABLET | Freq: Every day | ORAL | Status: DC
  Administered 2014-11-27 – 2014-11-29 (×3): 1 via ORAL
  Filled 2014-11-26 (×3): qty 1

## 2014-11-26 NOTE — Consult Note (Signed)
Physical Medicine and Rehabilitation Consult  Reason for Consult: Thoracic hematoma with paraparesis.  Referring Physician: Dr. Jeral FruitBotero.    HPI: [name] Weber is a 80 y.o. female with a history of PAF on coumadin, HLD and no other real significant medical history who presented to 11/16/14 with  new low back pain with weakness and inability to walk. MRI of spine with abnormal cord signal due to hemorrhage/hematoma in the ventral spinal canal T 10- T 12 causing spinal chord compression. Her coumadin was emergently reversed and patient underwent thoracic laminectomy T 11 -T 12 decompression of epidural space and evacuation of SDH by Dr. Jeral FruitBotero the same day.  Post op with A flutter and cardiology assisting with rate control and BP management. he had transient improvement in BLE weakness and follow up CT lumbar/thoracis spine reveals post op changes with with hyperdense blood products in lower lumbar spine.  She developed hypotension requiring florinef and cardizem changed to digoxin to help with symptoms. She continues to have orthostatic changes affecting mobility and IVF added for support. Follow up X rays of abdomen and spine ordered due to ongoing pain issues. CCS consulted for input on abdominal pain and CT abdomen/pelvis ordered for workup and revealed RLL PE as well as prominent pancreatic duct as well as possible ileus. Patient was transferred to ICU last pm for closer monitoring and currently on bedrest. BLE dopplers pending.  Patient with resultant paraparesis as well as bowel/bladder incontinence. MD, family and rehab team requesting CIR for progressive therapy. Family now indicating ability to provide 24/7 care past discharge.    Review of Systems  HENT: Positive for hearing loss.   Eyes: Negative for blurred vision and double vision.  Respiratory: Positive for shortness of breath. Negative for cough.   Cardiovascular: Negative for chest pain and palpitations.  Gastrointestinal:  Positive for nausea and abdominal pain (RLQ pain). Negative for heartburn and vomiting.       Right lower abdominal pain  Musculoskeletal: Positive for back pain (with activity).  Neurological: Positive for dizziness (with activity). Negative for headaches.      Past Medical History  Diagnosis Date  . Paroxysmal atrial flutter   . Palpitations     OCCASSIONAL  . PAC (premature atrial contraction)     ISOLATED  . PVC's (premature ventricular contractions)     ISOLATED  . Malaise   . Fatigue   . Myalgia     Past Surgical History  Procedure Laterality Date  . Vulvar lesion removal  01/01/2009    She had a 1-cm area of erythema to the right of the urethral meatus  . Thoracic laminectomy for epidural abscess N/A 11/16/2014    Procedure: THORACIC LAMINECTOMY FOR EPIDURAL ABSCESS;  Surgeon: Karn CassisErnesto M Botero, MD;  Location: [facility];  Service: Neurosurgery;  Laterality: N/A;    Family History  Problem Relation Age of Onset  . Hyperlipidemia Mother     Social History:   Lives alone.  Independent and active PTA. Reports she walks 5-6 miles 3-4 times/daily. she reports that she quit smoking about 23 years ago. She does not have any smokeless tobacco history on file. She reports that she does not drink alcohol or use illicit drugs.    Allergies  Allergen Reactions  . Crestor [Rosuvastatin Calcium]    Medications Prior to Admission  Medication Sig Dispense Refill  . alendronate (FOSAMAX) 35 MG tablet Take 35 mg by mouth every 7 (seven) days. Take with a full  glass of water on an empty stomach. Sunday    . atorvastatin (LIPITOR) 10 MG tablet Take 10 mg by mouth daily.    . Calcium Carbonate-Vitamin D (CALCIUM + D PO) Take by mouth 2 (two) times daily.      . Coenzyme Q10 (COQ10 PO) Take by mouth daily.    . metoprolol succinate (TOPROL XL) 25 MG 24 hr tablet Take 0.5 tablets (12.5 mg total) by mouth daily. 90 tablet 3  . Omega-3 Fatty Acids (FISH OIL PO) Take 2,000 mg by mouth 2  (two) times daily.      . predniSONE (DELTASONE) 20 MG tablet Take 2 tablets (40 mg total) by mouth daily. 10 tablet 0  . ramipril (ALTACE) 2.5 MG capsule Take 1 capsule (2.5 mg total) by mouth daily. 90 capsule 3  . warfarin (COUMADIN) 5 MG tablet Take as directed by coumadin clinic 90 tablet 2  . amoxicillin (AMOXIL) 500 MG capsule as needed. Prior to dental visit      Home: Home Living Family/patient expects to be discharged to:: Skilled nursing facility Living Arrangements: Alone  Functional History: Prior Function Level of Independence: Independent Functional Status:  Mobility: Bed Mobility Overal bed mobility: Needs Assistance, +2 for physical assistance Bed Mobility: Supine to Sit, Sit to Supine Rolling: Mod assist Sidelying to sit: Max assist Supine to sit: Max assist Sit to supine: +2 for physical assistance, Max assist General bed mobility comments: Pt asked to [facility] your knee. Pt attempting single leg raise. pt provided tactile cues and unable to follow command initially. Pt with cognitive deficits with task. Pt with no recall of sequence to exit bed safely with back precautions Transfers Overall transfer level: Needs assistance Equipment used: 2 person hand held assist Transfers: Sit to/from Stand, Stand Pivot Transfers Sit to Stand: +2 physical assistance, Min assist Stand pivot transfers: Min assist, +2 physical assistance General transfer comment: unable to complete more than a sit<>Stand squat due to incontinence and weakness. pt crying out with pain Ambulation/Gait Ambulation/Gait assistance: Mod assist, +2 safety/equipment Ambulation Distance (Feet): 50 Feet (10) Assistive device: Rolling walker (2 wheeled) Gait Pattern/deviations: Step-to pattern, Ataxic, Decreased weight shift to right, Decreased weight shift to left Gait velocity: decreased General Gait Details: unable due to BP    ADL: ADL Overall ADL's : Needs assistance/impaired Toilet Transfer: +2  for physical assistance, Moderate assistance Toilet Transfer Details (indicate cue type and reason): required ace wraps on bil LE and SCD applied to due orthostatic BP Toileting- Clothing Manipulation and Hygiene: Total assistance (total (A) for peri care and mod (A) for static standing ) Toileting - Clothing Manipulation Details (indicate cue type and reason): Pt static standing with OT and tech completing peri care. pt orthostatic and required return to sitting with ankle pumps, SCD and ace wraps prior to return to chair  Functional mobility during ADLs: Moderate assistance, +2 for physical assistance, Rolling walker General ADL Comments: Pt total (A) for peri care due to incontinence of bowel / bladder. Pt with new onset of bowel/ bladder starting previous week and has incr. OT unable to obtain orthostatic BP due to incontinence. Pt reports pain limiting all mobility. pt unabel to tolerate Eob sitting due to pain without support. Sister present and very concerned with pending d/c options due to phone call from [facility] stating pending arrival to their facilty today. Sister does not want patient to d/c to [facility] and prefers inpatient rehab. CM Steward Drone arriving and discussing with sister POA Aurea Graff  during end of session.  Cognition: Cognition Overall Cognitive Status: Impaired/Different from baseline Orientation Level: Oriented X4 Cognition Arousal/Alertness: Awake/alert Behavior During Therapy: WFL for tasks assessed/performed Overall Cognitive Status: Impaired/Different from baseline Area of Impairment: Attention, Memory, Following commands, Awareness Current Attention Level: Focused Memory: Decreased short-term memory Following Commands: Follows one step commands inconsistently Awareness: Emergent Problem Solving: Slow processing General Comments: Pt reports OT helped patient to chair on sunday 11/25/14. Ot reports to patient no we havent worked together since last week. Pt states "oh  it was this other girl then " and points to rehab tech present. Pt unable to report accurately to sister food that was eaten or events that occurred. Pt with incr confusion compared to previous session with OT. Pt also with lack of awareness to bowel and bladder uncontrolled  Blood pressure 121/67, pulse 79, temperature 98.4 F (36.9 C), temperature source Oral, resp. rate 15, height 5\' 6"  (1.676 m), weight 59.8 kg (131 lb 13.4 oz), SpO2 98 %. Physical Exam  Nursing note and vitals reviewed. Constitutional: She is oriented to person, place, and time. She appears well-developed. She appears cachectic.  HENT:  Head: Normocephalic and atraumatic.  Eyes: Conjunctivae are normal. Pupils are equal, round, and reactive to light.  Neck: Normal range of motion. Neck supple.  Cardiovascular: Normal rate.   Respiratory: Effort normal and breath sounds normal. No respiratory distress. She has no wheezes.  GI: Soft. Bowel sounds are normal. She exhibits no distension.  Mild fullness noted RLQ with some tenderness.   Musculoskeletal: She exhibits no edema or tenderness.  Neurological: She is alert and oriented to person, place, and time.  Speech soft but clear. Follows commands without difficulty. BLE with proximal weakness--R > LLE as well as sensory deficits, particularly in anterior thighs. ?mild loss of proprioception/LT in both legs. Strength: 3/5HF,KE and 4- ADF/APF. UE's 5/5.   Skin: Skin is warm and dry.    Results for orders placed or performed during the hospital encounter of 11/16/14 (from the past 24 hour(s))  Basic metabolic panel     Status: Abnormal   Collection Time: 11/26/14 10:15 AM  Result Value Ref Range   Sodium 131 (L) 135 - 145 mmol/L   Potassium 3.8 3.5 - 5.1 mmol/L   Chloride 99 96 - 112 mEq/L   CO2 27 19 - 32 mmol/L   Glucose, Bld 133 (H) 70 - 99 mg/dL   BUN 12 6 - 23 mg/dL   Creatinine, Ser 1.610.52 0.50 - 1.10 mg/dL   Calcium 8.0 (L) 8.4 - 10.5 mg/dL   GFR calc non Af Amer  87 (L) >90 mL/min   GFR calc Af Amer >90 >90 mL/min   Anion gap 5 5 - 15  CBC     Status: Abnormal   Collection Time: 11/26/14 10:15 AM  Result Value Ref Range   WBC 5.9 4.0 - 10.5 K/uL   RBC 3.56 (L) 3.87 - 5.11 MIL/uL   Hemoglobin 11.7 (L) 12.0 - 15.0 g/dL   HCT 09.634.4 (L) 04.536.0 - 40.946.0 %   MCV 96.6 78.0 - 100.0 fL   MCH 32.9 26.0 - 34.0 pg   MCHC 34.0 30.0 - 36.0 g/dL   RDW 81.111.7 91.411.5 - 78.215.5 %   Platelets 193 150 - 400 K/uL  Differential     Status: Abnormal   Collection Time: 11/26/14 10:15 AM  Result Value Ref Range   Neutrophils Relative % 72 43 - 77 %   Neutro Abs 4.3 1.7 -  7.7 K/uL   Lymphocytes Relative 11 (L) 12 - 46 %   Lymphs Abs 0.6 (L) 0.7 - 4.0 K/uL   Monocytes Relative 16 (H) 3 - 12 %   Monocytes Absolute 0.9 0.1 - 1.0 K/uL   Eosinophils Relative 1 0 - 5 %   Eosinophils Absolute 0.1 0.0 - 0.7 K/uL   Basophils Relative 0 0 - 1 %   Basophils Absolute 0.0 0.0 - 0.1 K/uL  MRSA PCR Screening     Status: None   Collection Time: 11/26/14 10:43 PM  Result Value Ref Range   MRSA by PCR NEGATIVE NEGATIVE   Dg Thoracic Spine 2 View  11/26/2014   CLINICAL DATA:  Mid back pain aggravated by movement.  EXAM: THORACIC SPINE - 2 VIEW  COMPARISON:  November 13, 2014.  FINDINGS: There is no evidence of thoracic spine fracture. Alignment is normal. Mild degenerative changes are noted in the lower thoracic spine.  IMPRESSION: No acute abnormality seen in the thoracic spine.   Electronically Signed   By: Roque Lias M.D.   On: 11/26/2014 14:22   Dg Lumbar Spine 2-3 Views  11/26/2014   CLINICAL DATA:  Mid and lower back pain.  EXAM: LUMBAR SPINE - 2-3 VIEW  COMPARISON:  11/18/2014  FINDINGS: Midline dorsal surgical staple line. Multilevel degenerative change of the visualized lumbar spine. There is grade 1 anterolisthesis of L5 on S1. Multilevel facet degenerative change most pronounced at L3-4, L4-5 and L5-S1. Aortic vascular calcifications. SI joints are unremarkable.  IMPRESSION:  Multilevel degenerative change of the lumbar spine.  Grade 1 anterolisthesis L5-S1.   Electronically Signed   By: Annia Belt M.D.   On: 11/26/2014 14:24   Ct Abdomen Pelvis W Contrast  11/26/2014   CLINICAL DATA:  Patient with history of recent back surgery. Right lower quadrant pain.  EXAM: CT ABDOMEN AND PELVIS WITH CONTRAST  TECHNIQUE: Multidetector CT imaging of the abdomen and pelvis was performed using the standard protocol following bolus administration of intravenous contrast.  CONTRAST:  OMNIPAQUE IOHEXOL 300 MG/ML  SOLN  COMPARISON:  11/16/2014  FINDINGS: Lower chest: Small bilateral pleural effusions. Normal heart size. Consolidative opacities within the bilateral lower lobes, suggestive of atelectasis. Additionally there is a central filling defect within a right lower lobe pulmonary artery (image 4; series 201), compatible with pulmonary embolism.  Hepatobiliary: The liver is normal in size and contour without focal hepatic lesion identified. Mild central intrahepatic biliary ductal dilatation. The common bile duct is appropriate for patient's age measuring up to 8 mm. Gallbladder is unremarkable.  Pancreas: The pancreatic duct measures up to 3 mm. The pancreatic parenchyma is grossly unremarkable.  Spleen: Unremarkable  Adrenals/Urinary Tract: Normal adrenal glands. Kidneys enhance symmetrically with contrast. Exophytic simple cyst off the inferior pole of the left kidney. No hydronephrosis. Urinary bladder is markedly distended.  Stomach/Bowel: Stool present throughout the colon as can be seen with constipation. Sigmoid colonic diverticulosis without evidence for acute diverticulitis. Normal appendix. No evidence for small bowel obstruction. Mild gaseous distended loops of small bowel, potentially representing postoperative ileus.  Vascular/Lymphatic: Scattered calcified atherosclerotic plaque involving the abdominal aorta. No retroperitoneal lymphadenopathy.  Other: Perihepatic ascites.   No free intraperitoneal air.  Anasarca.  Musculoskeletal: Postoperative changes involving the posterior elements of the T11, T12 and L1 vertebral bodies. No significant residual gas demonstrated within the spinal canal at these levels. There is fluid demonstrated within the overlying soft tissues. There is an overlying surgical staple line. Suggestion of  interval decrease in previously described high attenuation acute blood products within the spinal canal.  IMPRESSION: 1. Findings compatible with acute pulmonary embolism within the right lower lobe pulmonary artery. 2. Postoperative changes from the T11 through L1 vertebral bodies. There is associated fluid within the overlying soft tissues, likely postoperative in etiology. Superimposed infection not excluded. 3. Anasarca and small amount of ascites. 4. Marked urinary bladder dilatation. Consider decompression with Foley catheter. 5. Stool throughout the colon as can be seen with constipation. Possible postoperative ileus. 6. Prominent pancreatic duct measuring up to 3 mm. Mild central intrahepatic biliary ductal dilatation. Recommend correlation with laboratory analysis. Critical Value/emergent results were called by telephone at the time of interpretation on 11/26/2014 at 7:59 pm to Dr. Dutch Quint, who verbally acknowledged these results.   Electronically Signed   By: Annia Belt M.D.   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 Does the need for close, 24 hr/day medical supervision in concert with the patient's rehab needs make it unreasonable for this patient to be served in a less intensive setting? Yes 2. Co-Morbidities requiring supervision/potential complications: PE, afib/flutter 3. Due to bladder management, bowel management, safety, skin/wound care, disease management, medication administration, pain management and patient education, does the patient require 24 hr/day rehab nursing?  Yes 4. Does the patient require coordinated care of a physician, rehab nurse, PT (1-2 hrs/day, 5 days/week) and OT (1-2 hrs/day, 5 days/week) to address physical and functional deficits in the context of the above medical diagnosis(es)? Yes Addressing deficits in the following areas: balance, endurance, locomotion, strength, transferring, bowel/bladder control, bathing, dressing, feeding, grooming, toileting and psychosocial support 5. Can the patient actively participate in an intensive therapy program of at least 3 hrs of therapy per day at least 5 days per week? Yes 6. The potential for patient to make measurable gains while on inpatient rehab is excellent 7. Anticipated functional outcomes upon discharge from inpatient rehab are modified independent  with PT, modified independent and supervision with OT, n/a with SLP. 8. Estimated rehab length of stay to reach the above functional goals is: 15-20 days 9. Does the patient have adequate social supports and living environment to accommodate these discharge functional goals? Yes 10. Anticipated D/C setting: Home 11. Anticipated post D/C treatments: HH therapy and Outpatient therapy 12. Overall Rehab/Functional Prognosis: excellent  RECOMMENDATIONS: This patient's condition is appropriate for continued rehabilitative care in the following setting: CIR Patient has agreed to participate in recommended program. Yes Note that insurance prior authorization may be required for reimbursement for recommended care.  Comment: Pt is EXTREMELY motivated and was EXTREMELY active prior to admission. Will excel in our inpatient program. Appears to have supportive network who can assist once at home as well. Rehab Admissions Coordinator to follow up.  Thanks,  Ranelle Oyster, MD, [place] [facility]     11/27/2014

## 2014-11-26 NOTE — Progress Notes (Signed)
Pt leaving room for an exam. Will follow up in the am for consult.  Ranelle OysterZachary T. Swartz, MD, [facility] 11/26/2014

## 2014-11-26 NOTE — Progress Notes (Signed)
Pt transferred to 3M04. Pt stable and in no acute distress.

## 2014-11-26 NOTE — Progress Notes (Signed)
INITIAL NUTRITION ASSESSMENT  DOCUMENTATION CODES Per approved criteria  -Underweight   INTERVENTION: Encourage PO intake Provide Ensure Complete once daily, provides 350 kcal and 13 grams of protein Provide Multivitamin with minerals daily   NUTRITION DIAGNOSIS: Increased nutrient needs related to underweight status and recent surgery as evidenced by estimated energy and protein needs.   Goal: Pt to meet >/= 90% of their estimated nutrition needs  Monitor:  PO intake, weight trend, labs, I/O's  Reason for Assessment: Consult, Poor PO intake  80 y.o. female  Admitting Dx: <principal problem not specified>  ASSESSMENT: 80 y.o. Female who was in her usual state of health when she developed severe back/flank pain after a pilates class. She was seen at urgent care then transferred to the ED when she became unable to walk. She underwent a T11-L1 laminectomy for a spontaneous EDH. Ever since surgery she describes a RLQ/flank back that is quite severe.  Patient complaining of pain at time of RD visit- RN made aware and provided appropriate intervention. Pt reports that her appetite is good and she is eating normally. She reports that she usually weighs 108 lbs- she doesn't eat a lot but she maintains her weight. She reports eating a lot for lunch (50%) and requested a small dinner- soup and lemonade. Pt drinking Omnipaque at time of visit in preparation for CT scan.  RD encouraged PO intake and weight maintenance.   Labs: low sodium, low calcium  Height: Ht Readings from Last 1 Encounters:  11/17/14 5\' 6"  (1.676 m)    Weight: Wt Readings from Last 1 Encounters:  11/17/14 131 lb 13.4 oz (59.8 kg)    Ideal Body Weight: 130 lbs  % Ideal Body Weight: 83%  Wt Readings from Last 10 Encounters:  11/17/14 131 lb 13.4 oz (59.8 kg)  11/13/14 107 lb (48.535 kg)  07/16/14 108 lb (48.988 kg)  03/13/14 109 lb (49.442 kg)  11/13/13 107 lb (48.535 kg)  07/21/13 108 lb 6.4 oz (49.17 kg)   03/20/13 111 lb 6.4 oz (50.531 kg)  12/07/12 105 lb 3.2 oz (47.718 kg)  11/16/12 104 lb 9.6 oz (47.446 kg)  07/12/12 108 lb 12.8 oz (49.351 kg)    Usual Body Weight: 108 lbs  % Usual Body Weight: 121%  BMI:  Body mass index is 17.43 kg/(m^2) (Based on weight of 107 lbs)  Estimated Nutritional Needs: Kcal: 1500-1700 Protein: 60-75 grams Fluid: 1.5 L/day  Skin: closed incision on back  Diet Order: Diet Heart  EDUCATION NEEDS: -No education needs identified at this time  No intake or output data in the 24 hours ending 11/26/14 1500  Last BM: 12/28  Labs:   Recent Labs Lab 11/25/14 1335 11/26/14 1015  NA 126* 131*  K 4.0 3.8  CL 91* 99  CO2 30 27  BUN 17 12  CREATININE 0.63 0.52  CALCIUM 8.1* 8.0*  GLUCOSE 123* 133*    CBG (last 3)  No results for input(s): GLUCAP in the last 72 hours.  Scheduled Meds: . atorvastatin  10 mg Oral Daily  . bisacodyl  10 mg Rectal q morning - 10a  . digoxin  0.125 mg Oral Daily  . fludrocortisone  0.1 mg Oral Daily  . iohexol  25 mL Oral Q1 Hr x 2  . sodium chloride  3 mL Intravenous Q12H  . sodium chloride  1 g Oral TID WC  . tamsulosin  0.8 mg Oral Daily    Continuous Infusions: . sodium chloride 100 mL/hr at  11/26/14 1124    Past Medical History  Diagnosis Date  . Paroxysmal atrial flutter   . Palpitations     OCCASSIONAL  . PAC (premature atrial contraction)     ISOLATED  . PVC's (premature ventricular contractions)     ISOLATED  . Malaise   . Fatigue   . Myalgia     Past Surgical History  Procedure Laterality Date  . Vulvar lesion removal  01/01/2009    She had a 1-cm area of erythema to the right of the urethral meatus  . Thoracic laminectomy for epidural abscess N/A 11/16/2014    Procedure: THORACIC LAMINECTOMY FOR EPIDURAL ABSCESS;  Surgeon: Karn CassisErnesto M Botero, MD;  Location: [facility];  Service: Neurosurgery;  Laterality: N/A;    Ian Malkineanne Barnett RD, LDN Inpatient Clinical Dietitian Pager:  [phone] After Hours Pager: [phone]

## 2014-11-26 NOTE — Progress Notes (Signed)
Occupational Therapy Treatment Patient Details Name: [name] Weber MRN: [id number] DOB: 10/25/1931 Today's Date: 11/26/2014    History of present illness 80 yo female s/p epidural hematoma evacuation T11-12 cord compression by Dr Jeral FruitBotero. PMH: palpitations, PAC, PVS, Myalgia,    OT comments  OT session limited by pain, orthostatic BP unable to be obtain properly and incontinence of bowel/ bladder. Pt without any sensation that event is about to occur or has occurred. Pt asking "is that smell because I went to the bathroom?" Pt sitting on wet bed surface. Pt demonstrates incr LB weakness compared to previous OT session  MD - please order TED HOSE and ABDOMINAL BINDER. Please address incontinence issues with family  RN [name] Weber made aware of therapy request. OT requesting ted hose again due to orthostatic BP. Ace wraps left in room for therapy session pending Ted hose orders.   Follow Up Recommendations  CIR    Equipment Recommendations  Other (comment) (defer)    Recommendations for Other Services Rehab consult    Precautions / Restrictions Precautions Precautions: Fall;Back Precaution Comments: watch orthostatic BP       Mobility Bed Mobility Overal bed mobility: Needs Assistance;+2 for physical assistance Bed Mobility: Supine to Sit;Sit to Supine Rolling: Mod assist Sidelying to sit: Max assist Supine to sit: Max assist Sit to supine: +2 for physical assistance;Max assist   General bed mobility comments: [facility] to [facility]. Pt attempting single leg raise. pt provided tactile cues and unable to follow command initially. Pt with cognitive deficits with task. Pt with no recall of sequence to exit bed safely with back precautions  Transfers                 General transfer comment: unable to complete more than a sit<>Stand squat due to incontinence and weakness. pt crying out with pain    Balance Overall balance assessment: Needs assistance Sitting-balance  support: Bilateral upper extremity supported;Feet supported Sitting balance-Leahy Scale: Poor Sitting balance - Comments: pt requires support to stay EOB                           ADL Overall ADL's : Needs assistance/impaired                                       General ADL Comments: Pt total (A) for peri care due to incontinence of bowel / bladder. Pt with new onset of bowel/ bladder starting previous week and has incr. OT unable to obtain orthostatic BP due to incontinence. Pt reports pain limiting all mobility. pt unabel to tolerate Eob sitting due to pain without support. Sister present and very concerned with pending d/c options due to [facility] call [facility] stating pending arrival to their facilty today. Sister does not [facility] and prefers inpatient rehab. CM Steward DroneBrenda arriving and discussing with sister POA [name] Weber during end of session.      Vision                     Perception     Praxis      Cognition   Behavior During Therapy: [facility] for tasks assessed/performed Overall Cognitive Status: Impaired/Different from baseline Area of Impairment: Attention;Memory;Following commands;Awareness   Current Attention Level: Focused Memory: Decreased short-term memory  Following Commands: Follows one step commands inconsistently  Awareness: Emergent Problem Solving: Slow processing General Comments: Pt reports OT helped patient to chair on sunday 11/25/14. Ot reports to patient no we havent worked together since last week. Pt states "oh it was this other girl then " and points to rehab tech present. Pt unable to report accurately to sister food that was eaten or events that occurred. Pt with incr confusion compared to previous session with OT. Pt also with lack of awareness to bowel and bladder uncontrolled    Extremity/Trunk Assessment               Exercises     Shoulder Instructions       General Comments       Pertinent Vitals/ Pain       Pain Assessment: 0-10 Pain Score: 10-Worst pain ever Pain Location: below surgical location and R lower quadrant of abdomen Pain Descriptors / Indicators: Discomfort Pain Intervention(s): Repositioned;RN gave pain meds during session  Home Living                                          Prior Functioning/Environment              Frequency Min 2X/week     Progress Toward Goals  OT Goals(current goals can now be found in the care plan section)  Progress towards OT goals: Not progressing toward goals - comment (limited by BP and incr pain/ incontinence)  Acute Rehab OT Goals Patient Stated Goal: to get back to doing for myself - return home to dog "hersey" OT Goal Formulation: With patient/family Time For Goal Achievement: 12/03/14 Potential to Achieve Goals: Good ADL Goals Pt Will Perform Grooming: sitting;with supervision Pt Will Perform Upper Body Bathing: with min guard assist;sitting;with adaptive equipment Pt Will Perform Lower Body Bathing: with min assist;sit to/from stand;with adaptive equipment Pt Will Transfer to Toilet: with min assist;bedside commode Additional ADL Goal #1: Pt will complete bed mobility min (A) level with HOB flat no rails  Plan Discharge plan remains appropriate    Co-evaluation                 End of Session Equipment Utilized During Treatment: Gait belt   Activity Tolerance Patient limited by pain   Patient Left in bed;with call bell/phone within reach;with nursing/sitter in room   Nurse Communication Mobility status;Precautions        Time: 1000-1054 OT Time Calculation (min): 54 min  Charges: OT General Charges $OT Visit: 1 Procedure OT Treatments $Self Care/Home Management : 53-67 mins  Boone MasterJones, Derren Suydam B 11/26/2014, 11:09 AM Pager: [phone]

## 2014-11-26 NOTE — Progress Notes (Signed)
Attempted with 2 assist to complete order for orthostatic vital signs but was unable to assess BP and HR from the standing position due to severe weakness in both legs.

## 2014-11-26 NOTE — Progress Notes (Addendum)
OT NOTE  MD: please order ted hose and abdominal binder for blood pressure control to help with progression with therapy.   Orthostatic BPs  Supine 108/63  Sitting 124/70   OT unable to obtain BP in static standing due to uncontrolled incontinence of bowel / bladder x3 attempts. Pt return to supine. Pt with barrier cream and sacral foam dressing placed to decr risk of skin break down.   Pt with cognitive deficits noted this session. Pt unable to recall events from previous day events and reporting therapist present completed transferred with patient on Sunday and this information is inaccurate.   POA JOAN ( sister) requesting CIR [facility]. [facility] not [facility].   OT to treatment note to follow   [name] Weber, [name] Weber   OTR/L Pager: [phone] Office: [phone] .

## 2014-11-26 NOTE — Consult Note (Signed)
Reason for Consult:Abdominal pain Referring Physician: [name] is an 80 y.o. female.  HPI: [name] Weber was in her usual state of health when she developed severe back/flank pain after a pilates class. She was seen at urgent care then transferred to the ED when she became unable to walk. She underwent a T11-L1 laminectomy for a spontaneous EDH. She was on coumadin for afib. Ever since surgery she describes a RLQ/flank back that is quite severe. This only bothers her during movement. She describes some nausea without emesis but says it is not related to the pain. She denies any prior history of similar symptoms. When it's very bad it will radiate to the middle of her back. She has become incontinent since surgery but is having BM's and passing flatus.  Past Medical History  Diagnosis Date  . Paroxysmal atrial flutter   . Palpitations     OCCASSIONAL  . PAC (premature atrial contraction)     ISOLATED  . PVC's (premature ventricular contractions)     ISOLATED  . Malaise   . Fatigue   . Myalgia     Past Surgical History  Procedure Laterality Date  . Vulvar lesion removal  01/01/2009    She had a 1-cm area of erythema to the right of the urethral meatus  . Thoracic laminectomy for epidural abscess N/A 11/16/2014    Procedure: THORACIC LAMINECTOMY FOR EPIDURAL ABSCESS;  Surgeon: Floyce Stakes, MD;  Location: [facility];  Service: Neurosurgery;  Laterality: N/A;    Family History  Problem Relation Age of Onset  . Hyperlipidemia Mother     Social History:  reports that she quit smoking about 23 years ago. She does not have any smokeless tobacco history on file. She reports that she does not drink alcohol or use illicit drugs.  Allergies:  Allergies  Allergen Reactions  . Crestor [Rosuvastatin Calcium]     Medications: I have reviewed the patient's current medications.  Results for orders placed or performed during the hospital encounter of 11/16/14 (from  the past 48 hour(s))  CBC     Status: Abnormal   Collection Time: 11/25/14  1:35 PM  Result Value Ref Range   WBC 8.0 4.0 - 10.5 K/uL   RBC 3.77 (L) 3.87 - 5.11 MIL/uL   Hemoglobin 12.2 12.0 - 15.0 g/dL   HCT 35.8 (L) 36.0 - 46.0 %   MCV 95.0 78.0 - 100.0 fL   MCH 32.4 26.0 - 34.0 pg   MCHC 34.1 30.0 - 36.0 g/dL   RDW 11.5 11.5 - 15.5 %   Platelets 187 150 - 400 K/uL  Basic metabolic panel     Status: Abnormal   Collection Time: 11/25/14  1:35 PM  Result Value Ref Range   Sodium 126 (L) 135 - 145 mmol/L    Comment: Please note change in reference range. DELTA CHECK NOTED    Potassium 4.0 3.5 - 5.1 mmol/L    Comment: Please note change in reference range.   Chloride 91 (L) 96 - 112 mEq/L   CO2 30 19 - 32 mmol/L   Glucose, Bld 123 (H) 70 - 99 mg/dL   BUN 17 6 - 23 mg/dL   Creatinine, Ser 0.63 0.50 - 1.10 mg/dL   Calcium 8.1 (L) 8.4 - 10.5 mg/dL   GFR calc non Af Amer 82 (L) >90 mL/min   GFR calc Af Amer >90 >90 mL/min    Comment: (NOTE) The eGFR has been calculated using the  CKD EPI equation. This calculation has not been validated in all clinical situations. eGFR's persistently <90 mL/min signify possible Chronic Kidney Disease.    Anion gap 5 5 - 15  Basic metabolic panel     Status: Abnormal   Collection Time: 11/26/14 10:15 AM  Result Value Ref Range   Sodium 131 (L) 135 - 145 mmol/L    Comment: Please note change in reference range.   Potassium 3.8 3.5 - 5.1 mmol/L    Comment: Please note change in reference range.   Chloride 99 96 - 112 mEq/L   CO2 27 19 - 32 mmol/L   Glucose, Bld 133 (H) 70 - 99 mg/dL   BUN 12 6 - 23 mg/dL   Creatinine, Ser 0.52 0.50 - 1.10 mg/dL   Calcium 8.0 (L) 8.4 - 10.5 mg/dL   GFR calc non Af Amer 87 (L) >90 mL/min   GFR calc Af Amer >90 >90 mL/min    Comment: (NOTE) The eGFR has been calculated using the CKD EPI equation. This calculation has not been validated in all clinical situations. eGFR's persistently <90 mL/min signify  possible Chronic Kidney Disease.    Anion gap 5 5 - 15  CBC     Status: Abnormal   Collection Time: 11/26/14 10:15 AM  Result Value Ref Range   WBC 5.9 4.0 - 10.5 K/uL   RBC 3.56 (L) 3.87 - 5.11 MIL/uL   Hemoglobin 11.7 (L) 12.0 - 15.0 g/dL   HCT 34.4 (L) 36.0 - 46.0 %   MCV 96.6 78.0 - 100.0 fL   MCH 32.9 26.0 - 34.0 pg   MCHC 34.0 30.0 - 36.0 g/dL   RDW 11.7 11.5 - 15.5 %   Platelets 193 150 - 400 K/uL  Differential     Status: Abnormal   Collection Time: 11/26/14 10:15 AM  Result Value Ref Range   Neutrophils Relative % 72 43 - 77 %   Neutro Abs 4.3 1.7 - 7.7 K/uL   Lymphocytes Relative 11 (L) 12 - 46 %   Lymphs Abs 0.6 (L) 0.7 - 4.0 K/uL   Monocytes Relative 16 (H) 3 - 12 %   Monocytes Absolute 0.9 0.1 - 1.0 K/uL   Eosinophils Relative 1 0 - 5 %   Eosinophils Absolute 0.1 0.0 - 0.7 K/uL   Basophils Relative 0 0 - 1 %   Basophils Absolute 0.0 0.0 - 0.1 K/uL    No results found.  Review of Systems  Constitutional: Negative for fever.  Gastrointestinal: Positive for nausea and abdominal pain (RLQ). Negative for vomiting, diarrhea and constipation.  All other systems reviewed and are negative.  Blood pressure 113/53, pulse 79, temperature 97.7 F (36.5 C), temperature source Oral, resp. rate 19, height _0  (1.676 m), weight 131 lb 13.4 oz (59.8 kg), SpO2 100 %. Physical Exam  Constitutional: She appears well-developed. No distress.  HENT:  Head: Normocephalic and atraumatic.  Eyes: Conjunctivae are normal.  Neck: Normal range of motion.  Cardiovascular: Normal rate and normal heart sounds.  Exam reveals no gallop and no friction rub.   No murmur heard. Respiratory: Effort normal and breath sounds normal. No respiratory distress. She has no wheezes. She has no rales. She exhibits no tenderness.  GI: Soft. Normal appearance and bowel sounds are normal. There is tenderness (Very mild) in the right upper quadrant and right lower quadrant. There is no rigidity, no  rebound, no guarding, no CVA tenderness, no tenderness at McBurney's point and negative Murphy's  sign.  Genitourinary: Vagina normal.  Musculoskeletal: Normal range of motion.  Neurological: She is alert.  Skin: Skin is warm and dry.  Psychiatric: She has a normal mood and affect. Her behavior is normal.    Assessment/Plan: Abdominal pain -- I suspect this pain is neuropathic stemming from her spinal cord injury. Less likely possibilities include subclinical Zoster, intraabdominal pathology such as ischemic bowel or appendicitis, or rectus/oblique hematoma. Will obtain a CT scan to rule out the last two possibilities. If negative, would recommend muscle relaxer and/or Lyrica or similar.    Lisette Abu, PA-C Pager: [phone] 11/26/2014, 12:00 PM

## 2014-11-26 NOTE — Progress Notes (Signed)
eLink Physician-Brief Progress Note Patient Name: Stephani Policevelyn C Antu DOB: 06/15/1931 MRN: [id number]   Date of Service  11/26/2014  HPI/Events of Note  [age] yo woman, on NSGY service s/p emergent relief of epidural bleed 12/18. Evaluated for RLQ / back / flank pain and spuriously found RLL PA PE on CT scan abd. Moved to ICU hemodynamically stable on RA.   eICU Interventions  None required.      Intervention Category Evaluation Type: New Patient Evaluation  Armenta Erskin S. 11/26/2014, 11:53 PM

## 2014-11-26 NOTE — Clinical Social Work Note (Addendum)
CSW reviewed chart. Once pt is medically stable [facility] can [facility] to [facility]. CSW will continue to follow and assist.   Addendum: Pt and family requested CIR at a different hospital. CSW updated case manager with pt and family requested.   [name] Weber, MSW, LCSWA [phone]

## 2014-11-26 NOTE — Progress Notes (Signed)
Patient ID: [name] Weber, female   DOB: 05/30/1931, 80 y.o.   MRN: [id number] Pain in right lower quadrant of the abdomen. No rebound pain. flatus  Positive. No weakness

## 2014-11-26 NOTE — Progress Notes (Signed)
Patient Name: [name] Weber Date of Encounter: 11/26/2014     Active Problems:   Paroxysmal atrial flutter   Hypercholesterolemia   Paraplegia at T9 level   Weakness of both legs   Near syncope   Orthostatic hypotension    SUBJECTIVE  No CP or SOB. Continue to have significant R flank pain radiating to the back. No CP or SOB. Has dizziness with walking.   CURRENT MEDS . atorvastatin  10 mg Oral Daily  . bisacodyl  10 mg Rectal q morning - 10a  . digoxin  0.125 mg Oral Daily  . fludrocortisone  0.1 mg Oral Daily  . sodium chloride  3 mL Intravenous Q12H  . sodium chloride  1 g Oral TID WC  . tamsulosin  0.8 mg Oral Daily    OBJECTIVE  Filed Vitals:   11/25/14 2116 11/26/14 0129 11/26/14 0700 11/26/14 0922  BP: 117/62 120/60  113/53  Pulse: 81 80  79  Temp: 98 F (36.7 C) 98.1 F (36.7 C) 98.3 F (36.8 C) 97.7 F (36.5 C)  TempSrc: Oral Oral Oral Oral  Resp: 16 18  19   Height:      Weight:      SpO2: 98% 99% 99% 100%   No intake or output data in the 24 hours ending 11/26/14 1209 Filed Weights   11/16/14 0928 11/17/14 0107  Weight: 108 lb (48.988 kg) 131 lb 13.4 oz (59.8 kg)    PHYSICAL EXAM  General: Pleasant, NAD. Neuro: Alert and oriented X 3. Moves all extremities spontaneously. Psych: Normal affect. HEENT:  Normal  Neck: Supple without bruits or JVD. Lungs:  Resp regular and unlabored, anterior exam CTA. Heart: irregular no s3, s4, or murmurs. Abdomen: Soft, non-distended, BS + x 4. R flank tenderness, not distended.  Extremities: No clubbing, cyanosis or edema. DP/PT/Radials 2+ and equal bilaterally.  Accessory Clinical Findings  CBC  Recent Labs  11/25/14 1335 11/26/14 1015  WBC 8.0 5.9  NEUTROABS  --  4.3  HGB 12.2 11.7*  HCT 35.8* 34.4*  MCV 95.0 96.6  PLT 187 193   Basic Metabolic Panel  Recent Labs  11/25/14 1335 11/26/14 1015  NA 126* 131*  K 4.0 3.8  CL 91* 99  CO2 30 27  GLUCOSE 123* 133*  BUN 17 12    CREATININE 0.63 0.52  CALCIUM 8.1* 8.0*    TELE A-fib with HR 60-80s    ECG  No new EKG  Echocardiogram 11/22/2014  LV EF: 60% -  65%  ------------------------------------------------------------------- Indications:   Hypotension - chronic 458.1.  ------------------------------------------------------------------- History:  PMH:  Atrial fibrillation. Atrial flutter. Risk factors: Dyslipidemia.  ------------------------------------------------------------------- Study Conclusions  - Left ventricle: The cavity size was normal. Systolic function was normal. The estimated ejection fraction was in the range of 60% to 65%. Wall motion was normal; there were no regional wall motion abnormalities. - Right atrium: The atrium was mildly dilated. - Atrial septum: No defect or patent foramen ovale was identified. - Tricuspid valve: There was mild-moderate regurgitation. - Pulmonary arteries: PA peak pressure: 35 mm Hg (S). - Pericardium, extracardiac: A trivial pericardial effusion was identified posterior to the heart.     Radiology/Studies  Ct Abdomen Pelvis Wo Contrast  11/16/2014   CLINICAL DATA:  Right flank pain for 1 day with nausea and vomiting. Initial encounter.  EXAM: CT ABDOMEN AND PELVIS WITHOUT CONTRAST  TECHNIQUE: Multidetector CT imaging of the abdomen and pelvis was performed following the standard protocol without IV  contrast.  COMPARISON:  05/27/2010 abdominal pelvic CT.  FINDINGS: Lower chest: Clear lung bases. No significant pleural or pericardial effusion.  Hepatobiliary: As evaluated in the noncontrast state, the liver appears normal without focal abnormality. No evidence of gallstones, gallbladder wall thickening or biliary dilatation.  Pancreas: Unremarkable. No pancreatic ductal dilatation or surrounding inflammatory changes.  Spleen: Normal in size without focal abnormality.  Adrenals/Urinary Tract: Both adrenal glands appear normal.There  is no evidence of urinary tract calculus or hydronephrosis. This is small parenchymal calcification in the upper pole of the left kidney. A simple cyst posteriorly in the left kidney appears stable. The distal ureters are not well visualized, but no ureteral or bladder calculi are seen.  Stomach/Bowel: No evidence of bowel wall thickening, distention or surrounding inflammatory change.Mild distal colonic diverticular changes are present.  Vascular/Lymphatic: There are no enlarged abdominal or pelvic lymph nodes. There is diffuse aortoiliac atherosclerosis.  Reproductive: Status post hysterectomy. No evidence of adnexal mass.  Other: No evidence of abdominal wall mass or hernia.  Musculoskeletal: No acute or significant osseous findings. There are degenerative changes throughout the lumbar spine associated with a convex left scoliosis.  IMPRESSION: IMPRESSION 1. No evidence of urinary tract calculus or hydronephrosis. 2. No acute findings identified. 3. Diffuse atherosclerosis and mild distal colonic diverticulosis noted.   Electronically Signed   By: Roxy HorsemanBill  Veazey M.D.   On: 11/16/2014 10:43   Dg Chest 2 View  11/13/2014   CLINICAL DATA:  Recent injury while working out with difficulty breathing and right-sided chest pain  EXAM: CHEST  2 VIEW  COMPARISON:  None.  FINDINGS: Cardiac shadow is within normal limits. The lungs are hyperinflated consistent with COPD. No pneumothorax or sizable effusion is seen. No focal infiltrate is noted.  IMPRESSION: COPD without acute abnormality.   Electronically Signed   By: Alcide CleverMark  Lukens M.D.   On: 11/13/2014 12:09   Ct Thoracic Spine Wo Contrast  11/18/2014   CLINICAL DATA:  80 year old female who presented with back pain and progressive lower extremity weakness, found to have T10 level spinal hematoma subsequently determined to be mostly subdural on neurosurgical decompression and evacuation. Initially significantclinical improvement in symptoms, but now with lower  extremity recurrent weakness. Postoperative lumbar MRI with increased ventral spinal, but possibly superimposed on a significant amount of intraspinal gas. Further evaluation of that finding with CT. Initial encounter.  EXAM: CT THORACIC AND LUMBAR SPINE WITHOUT CONTRAST  TECHNIQUE: Multidetector CT imaging of the thoracic and lumbar spine was performed without contrast. Multiplanar CT image reconstructions were also generated.  COMPARISON:  Postoperative lumbar MRI today at 1138 hrs. Preoperative thoracic and lumbar MRI 11/16/2014.  FINDINGS: CT THORACIC SPINE FINDINGS  Posterior element postoperative changes from the T11 spinous process continuing into the upper lumbar spine. There is a percutaneous postoperative drain in place, extending deep into the laminectomy space and terminating along the left L2 spinous process. There is a small volume of superficial and laminectomy space postoperative gas.  With regard to the dark T2/stir signal seen along the ventral spinal canal in the lower thoracic and lumbar spine, there is only a small volume of gas present in the ventral canal posterior to the L1 vertebral body, and at the T12-L1 disc space. There is vacuum disc phenomena at T12-L1. No other gas within the thoracic spinal canal. With narrow CT windows, the ventral spinal canal density is seen to be hyperdense to CSF compatible with blood products (see series 207, image 19 and axial images 122  and 153 of series 202). As noted on the recent MRI this tapers cephalad to the T7 level.  Moderate size layering bilateral pleural effusions. Compressive atelectasis in both lungs, but more confluent left upper lobe opacity (series 201, image 43).  Calcified atherosclerosis of the thoracic aorta.  CT LUMBAR SPINE FINDINGS  Stable lumbar vertebral height and alignment.  Postoperative changes to the posterior elements from the lower thoracic spine continuing to the L1 lamina. Postoperative drain in place as detailed above.  As  stated above there is only a small volume of gas in the ventral spinal canal at the L1 level, and also along the right lateral aspect of the right L1-L2 facet. Therefore, the bulk of the space-occupying process seen on the postoperative MRI is blood products. With narrow CT windows, hyperdense blood products are visible in the ventral canal, and more circumferentially in the lower lumbar spine (e.g. L3-L4 series 202, image 65).  Small volume superficial postoperative gas in the subcutaneous fat. Subcutaneous edema. Skin staples in place.  Aortoiliac calcified atherosclerosis noted. Visible sacrum and SI joints intact.  IMPRESSION: 1. Only trace gas within the spinal canal, limited to the L1 level. Therefore, the (mostly ventral) space-occupying process seen by lumbar MRI earlier today tracking from the lumbar spine cephalad and tapering at the T7 level is blood products. 2. Postoperative changes to the posterior elements T11 through L1. Laminectomy space drain in place. 3. Moderate bilateral layering pleural effusions. Compressive atelectasis in both lungs, but more confluent left upper lobe opacity suspicious for developing pneumonia.   Electronically Signed   By: Augusto Gamble M.D.   On: 11/18/2014 14:10   Ct Lumbar Spine Wo Contrast  11/18/2014   CLINICAL DATA:  80 year old female who presented with back pain and progressive lower extremity weakness, found to have T10 level spinal hematoma subsequently determined to be mostly subdural on neurosurgical decompression and evacuation. Initially significantclinical improvement in symptoms, but now with lower extremity recurrent weakness. Postoperative lumbar MRI with increased ventral spinal, but possibly superimposed on a significant amount of intraspinal gas. Further evaluation of that finding with CT. Initial encounter.  EXAM: CT THORACIC AND LUMBAR SPINE WITHOUT CONTRAST  TECHNIQUE: Multidetector CT imaging of the thoracic and lumbar spine was performed without  contrast. Multiplanar CT image reconstructions were also generated.  COMPARISON:  Postoperative lumbar MRI today at 1138 hrs. Preoperative thoracic and lumbar MRI 11/16/2014.  FINDINGS: CT THORACIC SPINE FINDINGS  Posterior element postoperative changes from the T11 spinous process continuing into the upper lumbar spine. There is a percutaneous postoperative drain in place, extending deep into the laminectomy space and terminating along the left L2 spinous process. There is a small volume of superficial and laminectomy space postoperative gas.  With regard to the dark T2/stir signal seen along the ventral spinal canal in the lower thoracic and lumbar spine, there is only a small volume of gas present in the ventral canal posterior to the L1 vertebral body, and at the T12-L1 disc space. There is vacuum disc phenomena at T12-L1. No other gas within the thoracic spinal canal. With narrow CT windows, the ventral spinal canal density is seen to be hyperdense to CSF compatible with blood products (see series 207, image 19 and axial images 122 and 153 of series 202). As noted on the recent MRI this tapers cephalad to the T7 level.  Moderate size layering bilateral pleural effusions. Compressive atelectasis in both lungs, but more confluent left upper lobe opacity (series 201, image 43).  Calcified atherosclerosis of the thoracic aorta.  CT LUMBAR SPINE FINDINGS  Stable lumbar vertebral height and alignment.  Postoperative changes to the posterior elements from the lower thoracic spine continuing to the L1 lamina. Postoperative drain in place as detailed above.  As stated above there is only a small volume of gas in the ventral spinal canal at the L1 level, and also along the right lateral aspect of the right L1-L2 facet. Therefore, the bulk of the space-occupying process seen on the postoperative MRI is blood products. With narrow CT windows, hyperdense blood products are visible in the ventral canal, and more  circumferentially in the lower lumbar spine (e.g. L3-L4 series 202, image 65).  Small volume superficial postoperative gas in the subcutaneous fat. Subcutaneous edema. Skin staples in place.  Aortoiliac calcified atherosclerosis noted. Visible sacrum and SI joints intact.  IMPRESSION: 1. Only trace gas within the spinal canal, limited to the L1 level. Therefore, the (mostly ventral) space-occupying process seen by lumbar MRI earlier today tracking from the lumbar spine cephalad and tapering at the T7 level is blood products. 2. Postoperative changes to the posterior elements T11 through L1. Laminectomy space drain in place. 3. Moderate bilateral layering pleural effusions. Compressive atelectasis in both lungs, but more confluent left upper lobe opacity suspicious for developing pneumonia.   Electronically Signed   By: Augusto Gamble M.D.   On: 11/18/2014 14:10   Mr Lumbar Spine Wo Contrast  11/18/2014   ADDENDUM REPORT: 11/18/2014 12:58  ADDENDUM: Correction com of the first sentence of the Clinical Data section should read: "80 year old female with acute onset severe back pain after PILATES CLASS. "   Electronically Signed   By: Augusto Gamble M.D.   On: 11/18/2014 12:58   11/18/2014   CLINICAL DATA:  80 year old female with acute onset severe back pain after pole Oddi is class. Pain radiating anteriorly in the thorax, and progressing. Subsequent progressive weakness in her lower extremities, unable to walk. Current history of atrial fibrillation on Coumadin. Initial encounter.  EXAM: MRI THORACIC AND LUMBAR SPINE WITHOUT AND WITH CONTRAST  TECHNIQUE: Multiplanar and multiecho pulse sequences of the thoracic and lumbar spine were obtained without and with intravenous contrast.  CONTRAST:  10mL MULTIHANCE GADOBENATE DIMEGLUMINE 529 MG/ML IV SOLN  COMPARISON:  CT Abdomen and Pelvis without contrast 1024 hr today, and contrast-enhanced CT Abdomen and Pelvis 05/27/2010.  FINDINGS: MR THORACIC SPINE FINDINGS  Limited  sagittal imaging the cervical spine remarkable for widespread chronic disc and endplate degeneration.  Thoracic vertebral height and alignment within normal limits, aside from mild anterolisthesis at T11-T12 which is stable since 2011. Benign upper thoracic vertebral body hemangiomas are suspected including at T3 where there is mild enhancement of the lesion (series 18, image 5). No marrow edema or evidence of acute osseous abnormality.  Negative visualized posterior paraspinal soft tissues.  There is a small layering left pleural effusion. Dependent pulmonary atelectasis. Negative visualized upper abdominal viscera.  No upper thoracic spinal stenosis, however, there is abnormal upper thoracic spinal cord signal at the T4 level (series 5, image 7 and series 8, image 9). This signal appears to be located centrally and may also involve the dorsal columns. No cord expansion. No associated cord enhancement. The adjacent spinal cord segments appear within normal limits. No significant degenerative changes in this region.  Severe abnormality in the lower thoracic spine extending from the T10 to the T12 level, with a lobulated somewhat multiloculated appearing process occupying the ventral spinal canal at these  levels, encompassing 9 x 14 x 55 mm (AP by transverse by CC). The abnormality is heterogeneously hyperintense on T2 imaging, hyperintense on STIR imaging, and nearly isointense to CSF on T1 precontrast imaging. However, the lesion significantly loses signal on axial gradient echo imaging. Following contrast are several curvilinear areas of enhancement, mostly along the periphery of the lesion (series 18, image 6). Precontrast images do not suggest favor this process is extradural, but intradural location is difficult to exclude.  There is associated cord compression. The cord is posteriorly displaced and compressed, maximally at T11 (series 8, image 32). There does appear to be abnormal associated cord signal.  The  most distal spinal cord and conus medullaris are unaffected and appear within normal limits. No intra-axial enhancement.  No abnormality in this region was evident on the 2011 contrast-enhanced CT Abdomen and Pelvis.  MR LUMBAR SPINE FINDINGS  Lumbar segmentation is normal. Chronic L5-S1 anterolisthesis has mildly progressed since 2011. Mild retrolisthesis at L3-L4 has progressed. There are associated degenerative changes at these levels, including advanced disc and endplate degeneration at L4-L5 were there is mild marrow edema. No acute osseous abnormality identified.  There is patchy increased STIR signal in some of the posterior paraspinal soft tissues, nonspecific.  The conus medullaris is normal at L1-L2. The space-occupying mass in the lower thoracic spine appears to terminate at the T12-L1 disc space level. Cauda equina nerve roots are within normal limits, aside from the degenerative spinal stenosis findings described below.  However, there is trace layering hemorrhage at the term in a shin of the thecal sac at the S2-S3 level (series 13, image 6 and series 16, image 33).  Visualized abdominal viscera are stable since 2011.  The following degenerative changes are noted in the lumbar spine.  L2-L3: Lobulated disc bulge and severe facet hypertrophy. Mild spinal and L2 biforaminal stenosis.  L3-L4: Severe facet and ligament flavum hypertrophy with facet joint fluid. Broad-based disc bulge. Moderate spinal stenosis and right L3 foraminal stenosis.  L4-L5: Severe multifactorial spinal stenosis with right eccentric disc bulge and severe facet hypertrophy. Moderate right L4 foraminal stenosis.  L5-S1: Severe facet hypertrophy and degeneration with facet joint fluid. Grade 1 anterolisthesis at this level. Moderate ligament flavum hypertrophy. Broad-based disc/ pseudo disc. Mild spinal stenosis. Moderate left lateral recess stenosis. Moderate left L5 foraminal stenosis.  Visible sacrum intact.  Diverticulosis of the  sigmoid colon.  IMPRESSION: 1. Lower thoracic spinal cord compression with abnormal cord signal related to a lobulated roughly 1 x 1.5 x 5.5 cm space-occupying mass in the ventral spinal canal from the T10 to the T12 level. This most resembles hemorrhage/hematoma. This could be occupying the ventral epidural space (slightly favored) or intradural, and there is trace layering hemorrhage in the caudal thecal sac at S2-S3. Legrand Rams this is posttraumatic in nature rather than related to a spinal/dural AVM, however, small curvilinear areas of enhancement along the periphery of the hematoma are noted (perhaps due to active bleeding). 2.  No acute osseous abnormality identified. 3. Superimposed T4 level abnormal central and dorsal spinal cord signal, nonspecific. No degenerative changes in the region. Perhaps this is sequelae of prior transverse myelitis or less likely small cord infarct. 4. No acute findings in the lumbar spine. Moderate to severe degenerative spinal stenosis at L3-L4 and L4-L5. Critical Value/emergent results were called by telephone at the time of interpretation on 11/16/2014 at 1836 hrs. to Dr. Judie Petit. Pheiffer in the ED who verbally acknowledged these results.  Electronically Signed: By: Nedra Hai  Margo AyeHall M.D. On: 11/16/2014 19:13   Mr Lumbar Spine Wo Contrast  11/18/2014   CLINICAL DATA:  80 year old female who presented with back pain and progressive lower extremity weakness, found to have T10 level spinal hematoma subsequently determine to be mostly subdural on neuro surgical decompression and evacuation. Initially significant clinical improvement in symptoms, but now with lower extremity recurrent weakness. Initial encounter.  EXAM: MRI LUMBAR SPINE WITHOUT CONTRAST  TECHNIQUE: Multiplanar, multisequence MR imaging of the lumbar spine was performed. No intravenous contrast was administered.  COMPARISON:  Thoracic and lumbar spine MRI 11/16/2014.  FINDINGS: Normal lumbar segmentation noted on the comparison.  Stable lumbar vertebral height and alignment.  Posterior decompression from the T11 spinous process to the L2 spinous process. There is a small percutaneous postoperative drain in place at the laminectomy site. There is patchy T2 and STIR hyperintensity in the decompression space, along with a small volume of fluid.  Within the spinal canal there is new ventral space-occupying material measuring up to 6 mm in thickness which is widespread from the T11 level tracking caudally in the ventral lumbar spinal canal as far as L5. Much of this is extremely dark on T2 and STIR imaging and isointense on T1. There is also a bright T2 and STIR component which is dark on T1 weighted imaging. Limited sagittal imaging of the more superior spine was performed in order to visualize the upper most extent of this finding, it tracks cephalad tapering to the T7 level. Subsequently there is posterior displacement of the spinal cord/thecal sac throughout the extent of the finding.  Despite the continued mass effect, compression on the spinal cord has regressed at T10-T11, but there is new/increased lumbar spinal stenosis, maximal at L3-L4 and L4-L5 (severe).  In the sacral spinal canal there is increased but small volume blood products both layering in the thecal sac and adherent to the sacral nerve roots.  There may be mild residual increased T2 spinal cord signal at T10 and T11.  Stable visualized abdominal viscera.  IMPRESSION: 1. Postoperative changes from T11-L1 with postoperative drain in place and evacuation of the lobulated T10/T11 subdural hematoma. Regressed mass effect on the spinal cord at that level, perhaps mild residual T2 cord signal abnormality. 2. However, there is now increased mostly ventral spinal canal blood products tracking throughout the lumbar spine, and tapering cephalad to the T7 level. It is unclear whether some of this space-occupying material might be gas (there is a postoperative drain in place). Study  discussed by telephone with Dr. Ronaldo MiyamotoKYLE CABBELL on 11/18/2014 at 12:49 . We will follow-up with CT of the thoracic and lumbar spine to evaluate postoperative gas versus blood products.   Electronically Signed   By: Augusto GambleLee  Hall M.D.   On: 11/18/2014 12:56   Mr Thoracic Spine W Wo Contrast  11/18/2014   ADDENDUM REPORT: 11/18/2014 12:58  ADDENDUM: Correction com of the first sentence of the Clinical Data section should read: "80 year old female with acute onset severe back pain after PILATES CLASS. "   Electronically Signed   By: Augusto GambleLee  Hall M.D.   On: 11/18/2014 12:58   11/18/2014   CLINICAL DATA:  80 year old female with acute onset severe back pain after pole Oddi is class. Pain radiating anteriorly in the thorax, and progressing. Subsequent progressive weakness in her lower extremities, unable to walk. Current history of atrial fibrillation on Coumadin. Initial encounter.  EXAM: MRI THORACIC AND LUMBAR SPINE WITHOUT AND WITH CONTRAST  TECHNIQUE: Multiplanar and multiecho pulse sequences of  the thoracic and lumbar spine were obtained without and with intravenous contrast.  CONTRAST:  10mL MULTIHANCE GADOBENATE DIMEGLUMINE 529 MG/ML IV SOLN  COMPARISON:  CT Abdomen and Pelvis without contrast 1024 hr today, and contrast-enhanced CT Abdomen and Pelvis 05/27/2010.  FINDINGS: MR THORACIC SPINE FINDINGS  Limited sagittal imaging the cervical spine remarkable for widespread chronic disc and endplate degeneration.  Thoracic vertebral height and alignment within normal limits, aside from mild anterolisthesis at T11-T12 which is stable since 2011. Benign upper thoracic vertebral body hemangiomas are suspected including at T3 where there is mild enhancement of the lesion (series 18, image 5). No marrow edema or evidence of acute osseous abnormality.  Negative visualized posterior paraspinal soft tissues.  There is a small layering left pleural effusion. Dependent pulmonary atelectasis. Negative visualized upper abdominal  viscera.  No upper thoracic spinal stenosis, however, there is abnormal upper thoracic spinal cord signal at the T4 level (series 5, image 7 and series 8, image 9). This signal appears to be located centrally and may also involve the dorsal columns. No cord expansion. No associated cord enhancement. The adjacent spinal cord segments appear within normal limits. No significant degenerative changes in this region.  Severe abnormality in the lower thoracic spine extending from the T10 to the T12 level, with a lobulated somewhat multiloculated appearing process occupying the ventral spinal canal at these levels, encompassing 9 x 14 x 55 mm (AP by transverse by CC). The abnormality is heterogeneously hyperintense on T2 imaging, hyperintense on STIR imaging, and nearly isointense to CSF on T1 precontrast imaging. However, the lesion significantly loses signal on axial gradient echo imaging. Following contrast are several curvilinear areas of enhancement, mostly along the periphery of the lesion (series 18, image 6). Precontrast images do not suggest favor this process is extradural, but intradural location is difficult to exclude.  There is associated cord compression. The cord is posteriorly displaced and compressed, maximally at T11 (series 8, image 32). There does appear to be abnormal associated cord signal.  The most distal spinal cord and conus medullaris are unaffected and appear within normal limits. No intra-axial enhancement.  No abnormality in this region was evident on the 2011 contrast-enhanced CT Abdomen and Pelvis.  MR LUMBAR SPINE FINDINGS  Lumbar segmentation is normal. Chronic L5-S1 anterolisthesis has mildly progressed since 2011. Mild retrolisthesis at L3-L4 has progressed. There are associated degenerative changes at these levels, including advanced disc and endplate degeneration at L4-L5 were there is mild marrow edema. No acute osseous abnormality identified.  There is patchy increased STIR signal  in some of the posterior paraspinal soft tissues, nonspecific.  The conus medullaris is normal at L1-L2. The space-occupying mass in the lower thoracic spine appears to terminate at the T12-L1 disc space level. Cauda equina nerve roots are within normal limits, aside from the degenerative spinal stenosis findings described below.  However, there is trace layering hemorrhage at the term in a shin of the thecal sac at the S2-S3 level (series 13, image 6 and series 16, image 33).  Visualized abdominal viscera are stable since 2011.  The following degenerative changes are noted in the lumbar spine.  L2-L3: Lobulated disc bulge and severe facet hypertrophy. Mild spinal and L2 biforaminal stenosis.  L3-L4: Severe facet and ligament flavum hypertrophy with facet joint fluid. Broad-based disc bulge. Moderate spinal stenosis and right L3 foraminal stenosis.  L4-L5: Severe multifactorial spinal stenosis with right eccentric disc bulge and severe facet hypertrophy. Moderate right L4 foraminal stenosis.  L5-S1: Severe  facet hypertrophy and degeneration with facet joint fluid. Grade 1 anterolisthesis at this level. Moderate ligament flavum hypertrophy. Broad-based disc/ pseudo disc. Mild spinal stenosis. Moderate left lateral recess stenosis. Moderate left L5 foraminal stenosis.  Visible sacrum intact.  Diverticulosis of the sigmoid colon.  IMPRESSION: 1. Lower thoracic spinal cord compression with abnormal cord signal related to a lobulated roughly 1 x 1.5 x 5.5 cm space-occupying mass in the ventral spinal canal from the T10 to the T12 level. This most resembles hemorrhage/hematoma. This could be occupying the ventral epidural space (slightly favored) or intradural, and there is trace layering hemorrhage in the caudal thecal sac at S2-S3. Legrand Rams this is posttraumatic in nature rather than related to a spinal/dural AVM, however, small curvilinear areas of enhancement along the periphery of the hematoma are noted (perhaps due to  active bleeding). 2.  No acute osseous abnormality identified. 3. Superimposed T4 level abnormal central and dorsal spinal cord signal, nonspecific. No degenerative changes in the region. Perhaps this is sequelae of prior transverse myelitis or less likely small cord infarct. 4. No acute findings in the lumbar spine. Moderate to severe degenerative spinal stenosis at L3-L4 and L4-L5. Critical Value/emergent results were called by telephone at the time of interpretation on 11/16/2014 at 1836 hrs. to Dr. Judie Petit. Pheiffer in the ED who verbally acknowledged these results.  Electronically Signed: By: Augusto Gamble M.D. On: 11/16/2014 19:13    ASSESSMENT AND PLAN  1. PAF  - thoracic hematoma in the setting of INR 3.1, start ASA when felt stable by NSGY  - rate controlled on digoxin alone.   2. Thoracic hematoma s/p back surgery  - felt to be related to coumadin. Onset after pilates class. Follow by neurosurgery  - continue to have R flank, seen by surgery, felt to be neuropathic stemming from spinal cord injury  3. orthostatic hypotension  - Dr. Myrtis Ser stopped all cardiac meds which could lower BP, started on digoxin for rate control and florinef for hypotension.  4. ?PNA on CT of lumbar spine 5. Bilateral moderate pleural effusion  Signed, Azalee Course PA-C Pager: [phone]   I have seen and examined the patient along with Azalee Course PA-C.  I have reviewed the chart, notes and new data.  I agree with PA's note.  Key new complaints: improving, not dizzy, some improvement in pain Key examination changes: remains in irregular rhythm, BP normal range, AF well rate controlled   PLAN: Continue Digoxin monotherapy for rate control. Note that INR had been as high as 3.8 and 4.5 earlier in the year without complications. Therefore, cannot really blame warfarin as only cause for her spinal hemorrhage. Nevertheless, hard to justify restarting any potent anticoagulant.   Thurmon Fair, MD, [facility] [phone] 11/26/2014, 1:26 PM

## 2014-11-26 NOTE — Progress Notes (Signed)
Talked to Aurea GraffJoan, patient's sister; DCP - wants inpatient rehab; [facility] consult [facility]; Abelino DerrickB Mitzi Lilja RN,BSN,MHA [phone]

## 2014-11-26 NOTE — Progress Notes (Signed)
PT Cancellation Note  Patient Details Name: Stephani Policevelyn C Arico MRN: [id number] DOB: 05/22/1931   Cancelled Treatment:    Reason Eval/Treat Not Completed: Patient at procedure or test/unavailable.  Attempted to see patient x2 today.  On both occasions, patient being transported for testing.  Will return in am for PT session.   Vena AustriaDavis, Tramaine Sauls H 11/26/2014, 6:26 PM Durenda HurtSusan H. Renaldo Fiddleravis, PT, [facility] Pager [phone]

## 2014-11-27 ENCOUNTER — Other Ambulatory Visit: Payer: Self-pay | Admitting: Interventional Radiology

## 2014-11-27 ENCOUNTER — Inpatient Hospital Stay (HOSPITAL_COMMUNITY): Payer: Medicare Other

## 2014-11-27 DIAGNOSIS — I4892 Unspecified atrial flutter: Secondary | ICD-10-CM

## 2014-11-27 DIAGNOSIS — I4819 Other persistent atrial fibrillation: Secondary | ICD-10-CM

## 2014-11-27 DIAGNOSIS — I2699 Other pulmonary embolism without acute cor pulmonale: Secondary | ICD-10-CM | POA: Insufficient documentation

## 2014-11-27 DIAGNOSIS — IMO0002 Reserved for concepts with insufficient information to code with codable children: Secondary | ICD-10-CM | POA: Insufficient documentation

## 2014-11-27 DIAGNOSIS — S065X0D Traumatic subdural hemorrhage without loss of consciousness, subsequent encounter: Secondary | ICD-10-CM

## 2014-11-27 HISTORY — DX: Other persistent atrial fibrillation: I48.19

## 2014-11-27 LAB — BASIC METABOLIC PANEL
Anion gap: 5 (ref 5–15)
BUN: 7 mg/dL (ref 6–23)
CO2: 27 mmol/L (ref 19–32)
Calcium: 7.9 mg/dL — ABNORMAL LOW (ref 8.4–10.5)
Chloride: 100 mEq/L (ref 96–112)
Creatinine, Ser: 0.48 mg/dL — ABNORMAL LOW (ref 0.50–1.10)
GFR calc Af Amer: >90 mL/min (ref >90)
GFR calc non Af Amer: 90 mL/min — ABNORMAL LOW (ref >90)
Glucose, Bld: 114 mg/dL — ABNORMAL HIGH (ref 70–99)
Potassium: 3.5 mmol/L (ref 3.5–5.1)
Sodium: 132 mmol/L — ABNORMAL LOW (ref 135–145)

## 2014-11-27 LAB — CBC
HCT: 35.3 % — ABNORMAL LOW (ref 36.0–46.0)
Hemoglobin: 12 g/dL (ref 12.0–15.0)
MCH: 33 pg (ref 26.0–34.0)
MCHC: 34 g/dL (ref 30.0–36.0)
MCV: 97 fL (ref 78.0–100.0)
Platelets: 214 10*3/uL (ref 150–400)
RBC: 3.64 MIL/uL — ABNORMAL LOW (ref 3.87–5.11)
RDW: 11.7 % (ref 11.5–15.5)
WBC: 6.2 10*3/uL (ref 4.0–10.5)

## 2014-11-27 LAB — MRSA PCR SCREENING: MRSA by PCR: NEGATIVE

## 2014-11-27 MED ORDER — LIDOCAINE HCL 1 % IJ SOLN
INTRAMUSCULAR | Status: AC
  Filled 2014-11-27: qty 20

## 2014-11-27 MED ORDER — IOHEXOL 300 MG/ML  SOLN
100.0000 mL | Freq: Once | INTRAMUSCULAR | Status: AC | PRN
  Administered 2014-11-27: 50 mL via INTRAVENOUS

## 2014-11-27 MED ORDER — FENTANYL CITRATE 0.05 MG/ML IJ SOLN
INTRAMUSCULAR | Status: AC | PRN
  Administered 2014-11-27: 25 ug via INTRAVENOUS

## 2014-11-27 MED ORDER — MIDAZOLAM HCL 2 MG/2ML IJ SOLN
INTRAMUSCULAR | Status: AC
  Filled 2014-11-27: qty 2

## 2014-11-27 MED ORDER — FENTANYL CITRATE 0.05 MG/ML IJ SOLN
INTRAMUSCULAR | Status: AC
  Filled 2014-11-27: qty 2

## 2014-11-27 MED ORDER — ASPIRIN EC 81 MG PO TBEC
81.0000 mg | DELAYED_RELEASE_TABLET | Freq: Every day | ORAL | Status: DC
  Administered 2014-11-27 – 2014-11-29 (×3): 81 mg via ORAL
  Filled 2014-11-27 (×3): qty 1

## 2014-11-27 MED ORDER — MIDAZOLAM HCL 2 MG/2ML IJ SOLN
INTRAMUSCULAR | Status: AC | PRN
  Administered 2014-11-27: 1 mg via INTRAVENOUS

## 2014-11-27 NOTE — Sedation Documentation (Signed)
Patient denies pain and is resting comfortably.  

## 2014-11-27 NOTE — Progress Notes (Signed)
VASCULAR LAB PRELIMINARY  PRELIMINARY  PRELIMINARY  PRELIMINARY  Bilateral lower extremity venous duplex  completed.    Preliminary report:  Right:  Mobile DVT noted in the CFV.  DVT noted in the gastrocnemius vein.  No evidence of superficial thrombosis.  No Baker's cyst.  Left: DVT noted in a soleal vein in the calf.  No evidence of superficial thrombosis.  No Baker's cyst.  Wilbon Obenchain, RVT 11/27/2014, 5:37 PM

## 2014-11-27 NOTE — Progress Notes (Signed)
Rehab admissions - I spoke with patient's dtr-in-law.  Family would like inpatient rehab admission prior to home with family.  Noted patient to have an IVC filter placed today.  I will contact her insurance carrier and begin pre-authorization process for potential inpatient rehab admission.  I will follow up again tomorrow after I hear back from insurance case manager.  Call me for questions.  #[phone]

## 2014-11-27 NOTE — Progress Notes (Signed)
Patient ID: Stephani Policevelyn C Piano, female   DOB: 08/14/1931, 80 y.o.   MRN: [id number] Noted PE on CT last night. Also appreciate cardiology input. I D/W Dr. Jeral FruitBotero in person. Gen surgery to sign off.Please recall PRN. Violeta GelinasBurke Yakira Duquette, MD, MPH, FACS Trauma: [phone] General Surgery: [phone]

## 2014-11-27 NOTE — Progress Notes (Signed)
PT Cancellation Note  Patient Details Name: [name] MRN: [id number] DOB: 09/01/1931   Cancelled Treatment:    Reason Eval/Treat Not Completed: Patient not medically ready.  Pt with new PE.  Will hold PT and mobility at this time and await pt appropriateness for mobility.     Dai Apel, Alison MurrayMegan F 11/27/2014, 8:11 AM

## 2014-11-27 NOTE — Consult Note (Signed)
Reason for consult: IVC filter placement  Referring Physician(s): Dr. [name]  History of Present Illness: [name] Weber is a 80 y.o. female with a history of PAF on coumadin, HLD and no other real significant medical history who presented to [facility] on 11/16/14 with new low back pain with weakness and inability to walk. MRI of spine revealed abnormal cord signal due to hemorrhage/hematoma in the ventral spinal canal T 10- T 12 causing spinal cord compression. Her coumadin was emergently reversed and patient underwent thoracic laminectomy T 11 -T 12 decompression of epidural space and evacuation of SDH by Dr. [name] 12/18. Pt also recently noted to have acute RLL PE. LE venous dopplers are pending. Request now received for IVC filter placement.  Past Medical History  Diagnosis Date  . Paroxysmal atrial flutter   . Palpitations     OCCASSIONAL  . PAC (premature atrial contraction)     ISOLATED  . PVC's (premature ventricular contractions)     ISOLATED  . Malaise   . Fatigue   . Myalgia     Past Surgical History  Procedure Laterality Date  . Vulvar lesion removal  01/01/2009    She had a 1-cm area of erythema to the right of the urethral meatus  . Thoracic laminectomy for epidural abscess N/A 11/16/2014    Procedure: THORACIC LAMINECTOMY FOR EPIDURAL ABSCESS;  Surgeon: Karn Cassis, MD;  Location: [facility];  Service: Neurosurgery;  Laterality: N/A;    Allergies: Crestor  Medications: Prior to Admission medications   Medication Sig Start Date End Date Taking? Authorizing Provider  alendronate (FOSAMAX) 35 MG tablet Take 35 mg by mouth every 7 (seven) days. Take with a full glass of water on an empty stomach. Sunday   Yes Historical Provider, MD  atorvastatin (LIPITOR) 10 MG tablet Take 10 mg by mouth daily.   Yes Historical Provider, MD  Calcium Carbonate-Vitamin D (CALCIUM + D PO) Take by mouth 2 (two) times daily.     Yes Historical Provider, MD  Coenzyme Q10  (COQ10 PO) Take by mouth daily.   Yes Historical Provider, MD  metoprolol succinate (TOPROL XL) 25 MG 24 hr tablet Take 0.5 tablets (12.5 mg total) by mouth daily. 12/04/13 12/04/14 Yes Cassell Clement, MD  Omega-3 Fatty Acids (FISH OIL PO) Take 2,000 mg by mouth 2 (two) times daily.     Yes Historical Provider, MD  predniSONE (DELTASONE) 20 MG tablet Take 2 tablets (40 mg total) by mouth daily. 11/13/14  Yes Elvina Sidle, MD  ramipril (ALTACE) 2.5 MG capsule Take 1 capsule (2.5 mg total) by mouth daily. 12/04/13  Yes Cassell Clement, MD  warfarin (COUMADIN) 5 MG tablet Take as directed by coumadin clinic 01/15/14  Yes Cassell Clement, MD  amoxicillin (AMOXIL) 500 MG capsule as needed. Prior to dental visit 07/03/13   Historical Provider, MD  HYDROcodone-acetaminophen (NORCO/VICODIN) 5-325 MG per tablet Take 1 tablet by mouth every 4 (four) hours as needed for moderate pain. 11/16/14   Lyanne Co, MD  ondansetron (ZOFRAN ODT) 8 MG disintegrating tablet Take 1 tablet (8 mg total) by mouth every 8 (eight) hours as needed for nausea or vomiting. 11/16/14   Lyanne Co, MD    Family History  Problem Relation Age of Onset  . Hyperlipidemia Mother     History   Social History  . Marital Status: Divorced    Spouse Name: N/A    Number of Children: N/A  . Years of Education: N/A  Social History Main Topics  . Smoking status: Former Smoker    Quit date: 03/09/1991  . Smokeless tobacco: None  . Alcohol Use: No  . Drug Use: No  . Sexual Activity: None   Other Topics Concern  . None   Social History Narrative      Review of Systems see above; only sig c/o at present is mild rt flank/RLQ discomfort, occ orthostasis, minimal dyspnea  Vital Signs: BP 121/67 mmHg  Pulse 79  Temp(Src) 98.4 F (36.9 C) (Oral)  Resp 15  Ht 5\' 6"  (1.676 m)  Wt 131 lb 13.4 oz (59.8 kg)  BMI 21.29 kg/m2  SpO2 98%  Physical Exam pt awake/alert; chest- sl dim BS bases ; heart- irreg, nl rate; abd-  soft,+BS, mildly tender RLQ/flank regions; ext- no edema;strength/sens fxn intact  Imaging: Ct Abdomen Pelvis Wo Contrast  11/16/2014   CLINICAL DATA:  Right flank pain for 1 day with nausea and vomiting. Initial encounter.  EXAM: CT ABDOMEN AND PELVIS WITHOUT CONTRAST  TECHNIQUE: Multidetector CT imaging of the abdomen and pelvis was performed following the standard protocol without IV contrast.  COMPARISON:  05/27/2010 abdominal pelvic CT.  FINDINGS: Lower chest: Clear lung bases. No significant pleural or pericardial effusion.  Hepatobiliary: As evaluated in the noncontrast state, the liver appears normal without focal abnormality. No evidence of gallstones, gallbladder wall thickening or biliary dilatation.  Pancreas: Unremarkable. No pancreatic ductal dilatation or surrounding inflammatory changes.  Spleen: Normal in size without focal abnormality.  Adrenals/Urinary Tract: Both adrenal glands appear normal.There is no evidence of urinary tract calculus or hydronephrosis. This is small parenchymal calcification in the upper pole of the left kidney. A simple cyst posteriorly in the left kidney appears stable. The distal ureters are not well visualized, but no ureteral or bladder calculi are seen.  Stomach/Bowel: No evidence of bowel wall thickening, distention or surrounding inflammatory change.Mild distal colonic diverticular changes are present.  Vascular/Lymphatic: There are no enlarged abdominal or pelvic lymph nodes. There is diffuse aortoiliac atherosclerosis.  Reproductive: Status post hysterectomy. No evidence of adnexal mass.  Other: No evidence of abdominal wall mass or hernia.  Musculoskeletal: No acute or significant osseous findings. There are degenerative changes throughout the lumbar spine associated with a convex left scoliosis.  IMPRESSION: IMPRESSION 1. No evidence of urinary tract calculus or hydronephrosis. 2. No acute findings identified. 3. Diffuse atherosclerosis and mild distal  colonic diverticulosis noted.   Electronically Signed   By: Roxy Horseman M.D.   On: 11/16/2014 10:43   Dg Chest 2 View  11/13/2014   CLINICAL DATA:  Recent injury while working out with difficulty breathing and right-sided chest pain  EXAM: CHEST  2 VIEW  COMPARISON:  None.  FINDINGS: Cardiac shadow is within normal limits. The lungs are hyperinflated consistent with COPD. No pneumothorax or sizable effusion is seen. No focal infiltrate is noted.  IMPRESSION: COPD without acute abnormality.   Electronically Signed   By: Alcide Clever M.D.   On: 11/13/2014 12:09   Dg Thoracic Spine 2 View  11/26/2014   CLINICAL DATA:  Mid back pain aggravated by movement.  EXAM: THORACIC SPINE - 2 VIEW  COMPARISON:  November 13, 2014.  FINDINGS: There is no evidence of thoracic spine fracture. Alignment is normal. Mild degenerative changes are noted in the lower thoracic spine.  IMPRESSION: No acute abnormality seen in the thoracic spine.   Electronically Signed   By: Roque Lias M.D.   On: 11/26/2014 14:22  Dg Lumbar Spine 2-3 Views  11/26/2014   CLINICAL DATA:  Mid and lower back pain.  EXAM: LUMBAR SPINE - 2-3 VIEW  COMPARISON:  11/18/2014  FINDINGS: Midline dorsal surgical staple line. Multilevel degenerative change of the visualized lumbar spine. There is grade 1 anterolisthesis of L5 on S1. Multilevel facet degenerative change most pronounced at L3-4, L4-5 and L5-S1. Aortic vascular calcifications. SI joints are unremarkable.  IMPRESSION: Multilevel degenerative change of the lumbar spine.  Grade 1 anterolisthesis L5-S1.   Electronically Signed   By: Annia Belt M.D.   On: 11/26/2014 14:24   Ct Thoracic Spine Wo Contrast  11/18/2014   CLINICAL DATA:  80 year old female who presented with back pain and progressive lower extremity weakness, found to have T10 level spinal hematoma subsequently determined to be mostly subdural on neurosurgical decompression and evacuation. Initially significantclinical improvement  in symptoms, but now with lower extremity recurrent weakness. Postoperative lumbar MRI with increased ventral spinal, but possibly superimposed on a significant amount of intraspinal gas. Further evaluation of that finding with CT. Initial encounter.  EXAM: CT THORACIC AND LUMBAR SPINE WITHOUT CONTRAST  TECHNIQUE: Multidetector CT imaging of the thoracic and lumbar spine was performed without contrast. Multiplanar CT image reconstructions were also generated.  COMPARISON:  Postoperative lumbar MRI today at 1138 hrs. Preoperative thoracic and lumbar MRI 11/16/2014.  FINDINGS: CT THORACIC SPINE FINDINGS  Posterior element postoperative changes from the T11 spinous process continuing into the upper lumbar spine. There is a percutaneous postoperative drain in place, extending deep into the laminectomy space and terminating along the left L2 spinous process. There is a small volume of superficial and laminectomy space postoperative gas.  With regard to the dark T2/stir signal seen along the ventral spinal canal in the lower thoracic and lumbar spine, there is only a small volume of gas present in the ventral canal posterior to the L1 vertebral body, and at the T12-L1 disc space. There is vacuum disc phenomena at T12-L1. No other gas within the thoracic spinal canal. With narrow CT windows, the ventral spinal canal density is seen to be hyperdense to CSF compatible with blood products (see series 207, image 19 and axial images 122 and 153 of series 202). As noted on the recent MRI this tapers cephalad to the T7 level.  Moderate size layering bilateral pleural effusions. Compressive atelectasis in both lungs, but more confluent left upper lobe opacity (series 201, image 43).  Calcified atherosclerosis of the thoracic aorta.  CT LUMBAR SPINE FINDINGS  Stable lumbar vertebral height and alignment.  Postoperative changes to the posterior elements from the lower thoracic spine continuing to the L1 lamina. Postoperative drain  in place as detailed above.  As stated above there is only a small volume of gas in the ventral spinal canal at the L1 level, and also along the right lateral aspect of the right L1-L2 facet. Therefore, the bulk of the space-occupying process seen on the postoperative MRI is blood products. With narrow CT windows, hyperdense blood products are visible in the ventral canal, and more circumferentially in the lower lumbar spine (e.g. L3-L4 series 202, image 65).  Small volume superficial postoperative gas in the subcutaneous fat. Subcutaneous edema. Skin staples in place.  Aortoiliac calcified atherosclerosis noted. Visible sacrum and SI joints intact.  IMPRESSION: 1. Only trace gas within the spinal canal, limited to the L1 level. Therefore, the (mostly ventral) space-occupying process seen by lumbar MRI earlier today tracking from the lumbar spine cephalad and tapering at the  T7 level is blood products. 2. Postoperative changes to the posterior elements T11 through L1. Laminectomy space drain in place. 3. Moderate bilateral layering pleural effusions. Compressive atelectasis in both lungs, but more confluent left upper lobe opacity suspicious for developing pneumonia.   Electronically Signed   By: Augusto Gamble M.D.   On: 11/18/2014 14:10   Ct Lumbar Spine Wo Contrast  11/18/2014   CLINICAL DATA:  80 year old female who presented with back pain and progressive lower extremity weakness, found to have T10 level spinal hematoma subsequently determined to be mostly subdural on neurosurgical decompression and evacuation. Initially significantclinical improvement in symptoms, but now with lower extremity recurrent weakness. Postoperative lumbar MRI with increased ventral spinal, but possibly superimposed on a significant amount of intraspinal gas. Further evaluation of that finding with CT. Initial encounter.  EXAM: CT THORACIC AND LUMBAR SPINE WITHOUT CONTRAST  TECHNIQUE: Multidetector CT imaging of the thoracic and  lumbar spine was performed without contrast. Multiplanar CT image reconstructions were also generated.  COMPARISON:  Postoperative lumbar MRI today at 1138 hrs. Preoperative thoracic and lumbar MRI 11/16/2014.  FINDINGS: CT THORACIC SPINE FINDINGS  Posterior element postoperative changes from the T11 spinous process continuing into the upper lumbar spine. There is a percutaneous postoperative drain in place, extending deep into the laminectomy space and terminating along the left L2 spinous process. There is a small volume of superficial and laminectomy space postoperative gas.  With regard to the dark T2/stir signal seen along the ventral spinal canal in the lower thoracic and lumbar spine, there is only a small volume of gas present in the ventral canal posterior to the L1 vertebral body, and at the T12-L1 disc space. There is vacuum disc phenomena at T12-L1. No other gas within the thoracic spinal canal. With narrow CT windows, the ventral spinal canal density is seen to be hyperdense to CSF compatible with blood products (see series 207, image 19 and axial images 122 and 153 of series 202). As noted on the recent MRI this tapers cephalad to the T7 level.  Moderate size layering bilateral pleural effusions. Compressive atelectasis in both lungs, but more confluent left upper lobe opacity (series 201, image 43).  Calcified atherosclerosis of the thoracic aorta.  CT LUMBAR SPINE FINDINGS  Stable lumbar vertebral height and alignment.  Postoperative changes to the posterior elements from the lower thoracic spine continuing to the L1 lamina. Postoperative drain in place as detailed above.  As stated above there is only a small volume of gas in the ventral spinal canal at the L1 level, and also along the right lateral aspect of the right L1-L2 facet. Therefore, the bulk of the space-occupying process seen on the postoperative MRI is blood products. With narrow CT windows, hyperdense blood products are visible in the  ventral canal, and more circumferentially in the lower lumbar spine (e.g. L3-L4 series 202, image 65).  Small volume superficial postoperative gas in the subcutaneous fat. Subcutaneous edema. Skin staples in place.  Aortoiliac calcified atherosclerosis noted. Visible sacrum and SI joints intact.  IMPRESSION: 1. Only trace gas within the spinal canal, limited to the L1 level. Therefore, the (mostly ventral) space-occupying process seen by lumbar MRI earlier today tracking from the lumbar spine cephalad and tapering at the T7 level is blood products. 2. Postoperative changes to the posterior elements T11 through L1. Laminectomy space drain in place. 3. Moderate bilateral layering pleural effusions. Compressive atelectasis in both lungs, but more confluent left upper lobe opacity suspicious for developing pneumonia.  Electronically Signed   By: Augusto GambleLee  Hall M.D.   On: 11/18/2014 14:10   Mr Lumbar Spine Wo Contrast  11/18/2014   ADDENDUM REPORT: 11/18/2014 12:58  ADDENDUM: Correction com of the first sentence of the Clinical Data section should read: "80 year old female with acute onset severe back pain after PILATES CLASS. "   Electronically Signed   By: Augusto GambleLee  Hall M.D.   On: 11/18/2014 12:58   11/18/2014   CLINICAL DATA:  80 year old female with acute onset severe back pain after pole Oddi is class. Pain radiating anteriorly in the thorax, and progressing. Subsequent progressive weakness in her lower extremities, unable to walk. Current history of atrial fibrillation on Coumadin. Initial encounter.  EXAM: MRI THORACIC AND LUMBAR SPINE WITHOUT AND WITH CONTRAST  TECHNIQUE: Multiplanar and multiecho pulse sequences of the thoracic and lumbar spine were obtained without and with intravenous contrast.  CONTRAST:  10mL MULTIHANCE GADOBENATE DIMEGLUMINE 529 MG/ML IV SOLN  COMPARISON:  CT Abdomen and Pelvis without contrast 1024 hr today, and contrast-enhanced CT Abdomen and Pelvis 05/27/2010.  FINDINGS: MR THORACIC SPINE  FINDINGS  Limited sagittal imaging the cervical spine remarkable for widespread chronic disc and endplate degeneration.  Thoracic vertebral height and alignment within normal limits, aside from mild anterolisthesis at T11-T12 which is stable since 2011. Benign upper thoracic vertebral body hemangiomas are suspected including at T3 where there is mild enhancement of the lesion (series 18, image 5). No marrow edema or evidence of acute osseous abnormality.  Negative visualized posterior paraspinal soft tissues.  There is a small layering left pleural effusion. Dependent pulmonary atelectasis. Negative visualized upper abdominal viscera.  No upper thoracic spinal stenosis, however, there is abnormal upper thoracic spinal cord signal at the T4 level (series 5, image 7 and series 8, image 9). This signal appears to be located centrally and may also involve the dorsal columns. No cord expansion. No associated cord enhancement. The adjacent spinal cord segments appear within normal limits. No significant degenerative changes in this region.  Severe abnormality in the lower thoracic spine extending from the T10 to the T12 level, with a lobulated somewhat multiloculated appearing process occupying the ventral spinal canal at these levels, encompassing 9 x 14 x 55 mm (AP by transverse by CC). The abnormality is heterogeneously hyperintense on T2 imaging, hyperintense on STIR imaging, and nearly isointense to CSF on T1 precontrast imaging. However, the lesion significantly loses signal on axial gradient echo imaging. Following contrast are several curvilinear areas of enhancement, mostly along the periphery of the lesion (series 18, image 6). Precontrast images do not suggest favor this process is extradural, but intradural location is difficult to exclude.  There is associated cord compression. The cord is posteriorly displaced and compressed, maximally at T11 (series 8, image 32). There does appear to be abnormal associated  cord signal.  The most distal spinal cord and conus medullaris are unaffected and appear within normal limits. No intra-axial enhancement.  No abnormality in this region was evident on the 2011 contrast-enhanced CT Abdomen and Pelvis.  MR LUMBAR SPINE FINDINGS  Lumbar segmentation is normal. Chronic L5-S1 anterolisthesis has mildly progressed since 2011. Mild retrolisthesis at L3-L4 has progressed. There are associated degenerative changes at these levels, including advanced disc and endplate degeneration at L4-L5 were there is mild marrow edema. No acute osseous abnormality identified.  There is patchy increased STIR signal in some of the posterior paraspinal soft tissues, nonspecific.  The conus medullaris is normal at L1-L2. The space-occupying mass in the  lower thoracic spine appears to terminate at the T12-L1 disc space level. Cauda equina nerve roots are within normal limits, aside from the degenerative spinal stenosis findings described below.  However, there is trace layering hemorrhage at the term in a shin of the thecal sac at the S2-S3 level (series 13, image 6 and series 16, image 33).  Visualized abdominal viscera are stable since 2011.  The following degenerative changes are noted in the lumbar spine.  L2-L3: Lobulated disc bulge and severe facet hypertrophy. Mild spinal and L2 biforaminal stenosis.  L3-L4: Severe facet and ligament flavum hypertrophy with facet joint fluid. Broad-based disc bulge. Moderate spinal stenosis and right L3 foraminal stenosis.  L4-L5: Severe multifactorial spinal stenosis with right eccentric disc bulge and severe facet hypertrophy. Moderate right L4 foraminal stenosis.  L5-S1: Severe facet hypertrophy and degeneration with facet joint fluid. Grade 1 anterolisthesis at this level. Moderate ligament flavum hypertrophy. Broad-based disc/ pseudo disc. Mild spinal stenosis. Moderate left lateral recess stenosis. Moderate left L5 foraminal stenosis.  Visible sacrum intact.   Diverticulosis of the sigmoid colon.  IMPRESSION: 1. Lower thoracic spinal cord compression with abnormal cord signal related to a lobulated roughly 1 x 1.5 x 5.5 cm space-occupying mass in the ventral spinal canal from the T10 to the T12 level. This most resembles hemorrhage/hematoma. This could be occupying the ventral epidural space (slightly favored) or intradural, and there is trace layering hemorrhage in the caudal thecal sac at S2-S3. Legrand Rams this is posttraumatic in nature rather than related to a spinal/dural AVM, however, small curvilinear areas of enhancement along the periphery of the hematoma are noted (perhaps due to active bleeding). 2.  No acute osseous abnormality identified. 3. Superimposed T4 level abnormal central and dorsal spinal cord signal, nonspecific. No degenerative changes in the region. Perhaps this is sequelae of prior transverse myelitis or less likely small cord infarct. 4. No acute findings in the lumbar spine. Moderate to severe degenerative spinal stenosis at L3-L4 and L4-L5. Critical Value/emergent results were called by telephone at the time of interpretation on 11/16/2014 at 1836 hrs. to Dr. Judie Petit. Pheiffer in the ED who verbally acknowledged these results.  Electronically Signed: By: Augusto Gamble M.D. On: 11/16/2014 19:13   Mr Lumbar Spine Wo Contrast  11/18/2014   CLINICAL DATA:  80 year old female who presented with back pain and progressive lower extremity weakness, found to have T10 level spinal hematoma subsequently determine to be mostly subdural on neuro surgical decompression and evacuation. Initially significant clinical improvement in symptoms, but now with lower extremity recurrent weakness. Initial encounter.  EXAM: MRI LUMBAR SPINE WITHOUT CONTRAST  TECHNIQUE: Multiplanar, multisequence MR imaging of the lumbar spine was performed. No intravenous contrast was administered.  COMPARISON:  Thoracic and lumbar spine MRI 11/16/2014.  FINDINGS: Normal lumbar segmentation  noted on the comparison. Stable lumbar vertebral height and alignment.  Posterior decompression from the T11 spinous process to the L2 spinous process. There is a small percutaneous postoperative drain in place at the laminectomy site. There is patchy T2 and STIR hyperintensity in the decompression space, along with a small volume of fluid.  Within the spinal canal there is new ventral space-occupying material measuring up to 6 mm in thickness which is widespread from the T11 level tracking caudally in the ventral lumbar spinal canal as far as L5. Much of this is extremely dark on T2 and STIR imaging and isointense on T1. There is also a bright T2 and STIR component which is dark on T1 weighted  imaging. Limited sagittal imaging of the more superior spine was performed in order to visualize the upper most extent of this finding, it tracks cephalad tapering to the T7 level. Subsequently there is posterior displacement of the spinal cord/thecal sac throughout the extent of the finding.  Despite the continued mass effect, compression on the spinal cord has regressed at T10-T11, but there is new/increased lumbar spinal stenosis, maximal at L3-L4 and L4-L5 (severe).  In the sacral spinal canal there is increased but small volume blood products both layering in the thecal sac and adherent to the sacral nerve roots.  There may be mild residual increased T2 spinal cord signal at T10 and T11.  Stable visualized abdominal viscera.  IMPRESSION: 1. Postoperative changes from T11-L1 with postoperative drain in place and evacuation of the lobulated T10/T11 subdural hematoma. Regressed mass effect on the spinal cord at that level, perhaps mild residual T2 cord signal abnormality. 2. However, there is now increased mostly ventral spinal canal blood products tracking throughout the lumbar spine, and tapering cephalad to the T7 level. It is unclear whether some of this space-occupying material might be gas (there is a postoperative  drain in place). Study discussed by telephone with Dr. Ronaldo Miyamoto CABBELL on 11/18/2014 at 12:49 . We will follow-up with CT of the thoracic and lumbar spine to evaluate postoperative gas versus blood products.   Electronically Signed   By: Augusto Gamble M.D.   On: 11/18/2014 12:56   Mr Thoracic Spine W Wo Contrast  11/18/2014   ADDENDUM REPORT: 11/18/2014 12:58  ADDENDUM: Correction com of the first sentence of the Clinical Data section should read: "80 year old female with acute onset severe back pain after PILATES CLASS. "   Electronically Signed   By: Augusto Gamble M.D.   On: 11/18/2014 12:58   11/18/2014   CLINICAL DATA:  80 year old female with acute onset severe back pain after pole Oddi is class. Pain radiating anteriorly in the thorax, and progressing. Subsequent progressive weakness in her lower extremities, unable to walk. Current history of atrial fibrillation on Coumadin. Initial encounter.  EXAM: MRI THORACIC AND LUMBAR SPINE WITHOUT AND WITH CONTRAST  TECHNIQUE: Multiplanar and multiecho pulse sequences of the thoracic and lumbar spine were obtained without and with intravenous contrast.  CONTRAST:  10mL MULTIHANCE GADOBENATE DIMEGLUMINE 529 MG/ML IV SOLN  COMPARISON:  CT Abdomen and Pelvis without contrast 1024 hr today, and contrast-enhanced CT Abdomen and Pelvis 05/27/2010.  FINDINGS: MR THORACIC SPINE FINDINGS  Limited sagittal imaging the cervical spine remarkable for widespread chronic disc and endplate degeneration.  Thoracic vertebral height and alignment within normal limits, aside from mild anterolisthesis at T11-T12 which is stable since 2011. Benign upper thoracic vertebral body hemangiomas are suspected including at T3 where there is mild enhancement of the lesion (series 18, image 5). No marrow edema or evidence of acute osseous abnormality.  Negative visualized posterior paraspinal soft tissues.  There is a small layering left pleural effusion. Dependent pulmonary atelectasis. Negative  visualized upper abdominal viscera.  No upper thoracic spinal stenosis, however, there is abnormal upper thoracic spinal cord signal at the T4 level (series 5, image 7 and series 8, image 9). This signal appears to be located centrally and may also involve the dorsal columns. No cord expansion. No associated cord enhancement. The adjacent spinal cord segments appear within normal limits. No significant degenerative changes in this region.  Severe abnormality in the lower thoracic spine extending from the T10 to the T12 level, with a lobulated somewhat  multiloculated appearing process occupying the ventral spinal canal at these levels, encompassing 9 x 14 x 55 mm (AP by transverse by CC). The abnormality is heterogeneously hyperintense on T2 imaging, hyperintense on STIR imaging, and nearly isointense to CSF on T1 precontrast imaging. However, the lesion significantly loses signal on axial gradient echo imaging. Following contrast are several curvilinear areas of enhancement, mostly along the periphery of the lesion (series 18, image 6). Precontrast images do not suggest favor this process is extradural, but intradural location is difficult to exclude.  There is associated cord compression. The cord is posteriorly displaced and compressed, maximally at T11 (series 8, image 32). There does appear to be abnormal associated cord signal.  The most distal spinal cord and conus medullaris are unaffected and appear within normal limits. No intra-axial enhancement.  No abnormality in this region was evident on the 2011 contrast-enhanced CT Abdomen and Pelvis.  MR LUMBAR SPINE FINDINGS  Lumbar segmentation is normal. Chronic L5-S1 anterolisthesis has mildly progressed since 2011. Mild retrolisthesis at L3-L4 has progressed. There are associated degenerative changes at these levels, including advanced disc and endplate degeneration at L4-L5 were there is mild marrow edema. No acute osseous abnormality identified.  There is  patchy increased STIR signal in some of the posterior paraspinal soft tissues, nonspecific.  The conus medullaris is normal at L1-L2. The space-occupying mass in the lower thoracic spine appears to terminate at the T12-L1 disc space level. Cauda equina nerve roots are within normal limits, aside from the degenerative spinal stenosis findings described below.  However, there is trace layering hemorrhage at the term in a shin of the thecal sac at the S2-S3 level (series 13, image 6 and series 16, image 33).  Visualized abdominal viscera are stable since 2011.  The following degenerative changes are noted in the lumbar spine.  L2-L3: Lobulated disc bulge and severe facet hypertrophy. Mild spinal and L2 biforaminal stenosis.  L3-L4: Severe facet and ligament flavum hypertrophy with facet joint fluid. Broad-based disc bulge. Moderate spinal stenosis and right L3 foraminal stenosis.  L4-L5: Severe multifactorial spinal stenosis with right eccentric disc bulge and severe facet hypertrophy. Moderate right L4 foraminal stenosis.  L5-S1: Severe facet hypertrophy and degeneration with facet joint fluid. Grade 1 anterolisthesis at this level. Moderate ligament flavum hypertrophy. Broad-based disc/ pseudo disc. Mild spinal stenosis. Moderate left lateral recess stenosis. Moderate left L5 foraminal stenosis.  Visible sacrum intact.  Diverticulosis of the sigmoid colon.  IMPRESSION: 1. Lower thoracic spinal cord compression with abnormal cord signal related to a lobulated roughly 1 x 1.5 x 5.5 cm space-occupying mass in the ventral spinal canal from the T10 to the T12 level. This most resembles hemorrhage/hematoma. This could be occupying the ventral epidural space (slightly favored) or intradural, and there is trace layering hemorrhage in the caudal thecal sac at S2-S3. Legrand Rams this is posttraumatic in nature rather than related to a spinal/dural AVM, however, small curvilinear areas of enhancement along the periphery of the  hematoma are noted (perhaps due to active bleeding). 2.  No acute osseous abnormality identified. 3. Superimposed T4 level abnormal central and dorsal spinal cord signal, nonspecific. No degenerative changes in the region. Perhaps this is sequelae of prior transverse myelitis or less likely small cord infarct. 4. No acute findings in the lumbar spine. Moderate to severe degenerative spinal stenosis at L3-L4 and L4-L5. Critical Value/emergent results were called by telephone at the time of interpretation on 11/16/2014 at 1836 hrs. to Dr. Judie Petit. Pheiffer in the ED  who verbally acknowledged these results.  Electronically Signed: By: Augusto Gamble M.D. On: 11/16/2014 19:13   Ct Abdomen Pelvis W Contrast  11/26/2014   CLINICAL DATA:  Patient with history of recent back surgery. Right lower quadrant pain.  EXAM: CT ABDOMEN AND PELVIS WITH CONTRAST  TECHNIQUE: Multidetector CT imaging of the abdomen and pelvis was performed using the standard protocol following bolus administration of intravenous contrast.  CONTRAST:  OMNIPAQUE IOHEXOL 300 MG/ML  SOLN  COMPARISON:  11/16/2014  FINDINGS: Lower chest: Small bilateral pleural effusions. Normal heart size. Consolidative opacities within the bilateral lower lobes, suggestive of atelectasis. Additionally there is a central filling defect within a right lower lobe pulmonary artery (image 4; series 201), compatible with pulmonary embolism.  Hepatobiliary: The liver is normal in size and contour without focal hepatic lesion identified. Mild central intrahepatic biliary ductal dilatation. The common bile duct is appropriate for patient's age measuring up to 8 mm. Gallbladder is unremarkable.  Pancreas: The pancreatic duct measures up to 3 mm. The pancreatic parenchyma is grossly unremarkable.  Spleen: Unremarkable  Adrenals/Urinary Tract: Normal adrenal glands. Kidneys enhance symmetrically with contrast. Exophytic simple cyst off the inferior pole of the left kidney. No  hydronephrosis. Urinary bladder is markedly distended.  Stomach/Bowel: Stool present throughout the colon as can be seen with constipation. Sigmoid colonic diverticulosis without evidence for acute diverticulitis. Normal appendix. No evidence for small bowel obstruction. Mild gaseous distended loops of small bowel, potentially representing postoperative ileus.  Vascular/Lymphatic: Scattered calcified atherosclerotic plaque involving the abdominal aorta. No retroperitoneal lymphadenopathy.  Other: Perihepatic ascites.  No free intraperitoneal air.  Anasarca.  Musculoskeletal: Postoperative changes involving the posterior elements of the T11, T12 and L1 vertebral bodies. No significant residual gas demonstrated within the spinal canal at these levels. There is fluid demonstrated within the overlying soft tissues. There is an overlying surgical staple line. Suggestion of interval decrease in previously described high attenuation acute blood products within the spinal canal.  IMPRESSION: 1. Findings compatible with acute pulmonary embolism within the right lower lobe pulmonary artery. 2. Postoperative changes from the T11 through L1 vertebral bodies. There is associated fluid within the overlying soft tissues, likely postoperative in etiology. Superimposed infection not excluded. 3. Anasarca and small amount of ascites. 4. Marked urinary bladder dilatation. Consider decompression with Foley catheter. 5. Stool throughout the colon as can be seen with constipation. Possible postoperative ileus. 6. Prominent pancreatic duct measuring up to 3 mm. Mild central intrahepatic biliary ductal dilatation. Recommend correlation with laboratory analysis. Critical Value/emergent results were called by telephone at the time of interpretation on 11/26/2014 at 7:59 pm to Dr. Dutch Quint, who verbally acknowledged these results.   Electronically Signed   By: Annia Belt M.D.   On: 11/26/2014 20:01    Labs:  CBC:  Recent Labs   11/16/14 2330 11/19/14 0540 11/25/14 1335 11/26/14 1015  WBC 8.6 11.8* 8.0 5.9  HGB 10.9* 13.1 12.2 11.7*  HCT 33.5* 39.8 35.8* 34.4*  PLT 124* 146* 187 193    COAGS:  Recent Labs  11/17/14 1133 11/17/14 1637 11/18/14 0325 11/19/14 0540  INR 1.01 1.07 1.12 1.10    BMP:  Recent Labs  11/16/14 1000 11/16/14 2233 11/19/14 0540 11/25/14 1335 11/26/14 1015  NA 142 134* 139 126* 131*  K 4.4 5.7* 4.4 4.0 3.8  CL 101  --  100 91* 99  CO2 28  --  28 30 27   GLUCOSE 162* 196* 131* 123* 133*  BUN 17  --  18 17 12   CALCIUM 9.4  --  9.0 8.1* 8.0*  CREATININE 0.60  --  0.52 0.63 0.52  GFRNONAA 83*  --  87* 82* 87*  GFRAA >90  --  >90 >90 >90    LIVER FUNCTION TESTS:  Recent Labs  11/16/14 1000  BILITOT 0.6  AST 41*  ALT 33  ALKPHOS 56  PROT 7.0  ALBUMIN 3.6    TUMOR MARKERS: No results for input(s): AFPTM, CEA, CA199, CHROMGRNA in the last 8760 hours.  Assessment and Plan: Pt with hx PAF on coumadin/recent thoracic SDH evacuation; now with acute PE. Plan is for IVC filter placement later today(pt ate breakfast this am). Details/risks of procedure d/w pt with her understanding and consent.    I spent a total of 20 minutes face to face in clinical consultation, greater than 50% of which was counseling/coordinating care for IVC filter placement  Signed: Emmagene Ortner,D [facility] 11/27/2014, 9:52 AM

## 2014-11-27 NOTE — Procedures (Signed)
Interventional Radiology Procedure Note  Procedure: Placement of potentially retrievable IVC filter, infrarenal.  Complications: None immediate Recommendations: - Bedrest x 2 hours - May resume diet  Signed,  Sterling BigHeath K. McCullough, MD

## 2014-11-27 NOTE — Progress Notes (Signed)
Patient Name: [name] Weber Date of Encounter: 11/27/2014  Active Problems:   Paroxysmal atrial flutter   Hypercholesterolemia   Paraplegia at T9 level   Weakness of both legs   Near syncope   Orthostatic hypotension   Traumatic spinal subdural hematoma   Acute pulmonary embolism   Length of Stay: 11  SUBJECTIVE  No cardiac complaints. Remains in atrial fibrillation, with good rate control. BP is OK. New right lower lobe pulmonary embolism. For IVC filter today.  CURRENT MEDS . atorvastatin  10 mg Oral Daily  . bisacodyl  10 mg Rectal q morning - 10a  . digoxin  0.125 mg Oral Daily  . feeding supplement (ENSURE COMPLETE)  237 mL Oral Q24H  . fludrocortisone  0.1 mg Oral Daily  . multivitamin with minerals  1 tablet Oral Daily  . sodium chloride  3 mL Intravenous Q12H  . sodium chloride  1 g Oral TID WC  . tamsulosin  0.8 mg Oral Daily    OBJECTIVE   Intake/Output Summary (Last 24 hours) at 11/27/14 1102 Last data filed at 11/27/14 0800  Gross per 24 hour  Intake   1200 ml  Output   1850 ml  Net   -650 ml   Filed Weights   11/16/14 0928 11/17/14 0107  Weight: 108 lb (48.988 kg) 131 lb 13.4 oz (59.8 kg)    PHYSICAL EXAM Filed Vitals:   11/27/14 0600 11/27/14 0700 11/27/14 0800 11/27/14 0801  BP: 131/53 101/69 121/67   Pulse: 75 68 79   Temp:    98.4 F (36.9 C)  TempSrc:    Oral  Resp: 20 15 15    Height:      Weight:      SpO2: 99% 97% 98%    General: Alert, oriented x3, no distress Head: no evidence of trauma, PERRL, EOMI, no exophtalmos or lid lag, no myxedema, no xanthelasma; normal ears, nose and oropharynx Neck: normal jugular venous pulsations and no hepatojugular reflux; brisk carotid pulses without delay and no carotid bruits Chest: clear to auscultation, no signs of consolidation by percussion or palpation, normal fremitus, symmetrical and full respiratory excursions Cardiovascular: normal position and quality of the apical impulse,  irregular rhythm, normal first and second heart sounds, no rubs or gallops, no murmur Abdomen: no tenderness or distention, no masses by palpation, no abnormal pulsatility or arterial bruits, normal bowel sounds, no hepatosplenomegaly Extremities: no clubbing, cyanosis or edema; 2+ radial, ulnar and brachial pulses bilaterally; 2+ right femoral, posterior tibial and dorsalis pedis pulses; 2+ left femoral, posterior tibial and dorsalis pedis pulses; no subclavian or femoral bruits  LABS  CBC  Recent Labs  11/25/14 1335 11/26/14 1015  WBC 8.0 5.9  NEUTROABS  --  4.3  HGB 12.2 11.7*  HCT 35.8* 34.4*  MCV 95.0 96.6  PLT 187 193   Basic Metabolic Panel  Recent Labs  11/25/14 1335 11/26/14 1015  NA 126* 131*  K 4.0 3.8  CL 91* 99  CO2 30 27  GLUCOSE 123* 133*  BUN 17 12  CREATININE 0.63 0.52  CALCIUM 8.1* 8.0*   Radiology Studies Imaging results have been reviewed and Dg Thoracic Spine 2 View  11/26/2014   CLINICAL DATA:  Mid back pain aggravated by movement.  EXAM: THORACIC SPINE - 2 VIEW  COMPARISON:  November 13, 2014.  FINDINGS: There is no evidence of thoracic spine fracture. Alignment is normal. Mild degenerative changes are noted in the lower thoracic spine.  IMPRESSION: No acute abnormality  seen in the thoracic spine.   Electronically Signed   By: Roque Lias M.D.   On: 11/26/2014 14:22   Dg Lumbar Spine 2-3 Views  11/26/2014   CLINICAL DATA:  Mid and lower back pain.  EXAM: LUMBAR SPINE - 2-3 VIEW  COMPARISON:  11/18/2014  FINDINGS: Midline dorsal surgical staple line. Multilevel degenerative change of the visualized lumbar spine. There is grade 1 anterolisthesis of L5 on S1. Multilevel facet degenerative change most pronounced at L3-4, L4-5 and L5-S1. Aortic vascular calcifications. SI joints are unremarkable.  IMPRESSION: Multilevel degenerative change of the lumbar spine.  Grade 1 anterolisthesis L5-S1.   Electronically Signed   By: Annia Belt M.D.   On: 11/26/2014  14:24   Ct Abdomen Pelvis W Contrast  11/26/2014   CLINICAL DATA:  Patient with history of recent back surgery. Right lower quadrant pain.  EXAM: CT ABDOMEN AND PELVIS WITH CONTRAST  TECHNIQUE: Multidetector CT imaging of the abdomen and pelvis was performed using the standard protocol following bolus administration of intravenous contrast.  CONTRAST:  OMNIPAQUE IOHEXOL 300 MG/ML  SOLN  COMPARISON:  11/16/2014  FINDINGS: Lower chest: Small bilateral pleural effusions. Normal heart size. Consolidative opacities within the bilateral lower lobes, suggestive of atelectasis. Additionally there is a central filling defect within a right lower lobe pulmonary artery (image 4; series 201), compatible with pulmonary embolism.  Hepatobiliary: The liver is normal in size and contour without focal hepatic lesion identified. Mild central intrahepatic biliary ductal dilatation. The common bile duct is appropriate for patient's age measuring up to 8 mm. Gallbladder is unremarkable.  Pancreas: The pancreatic duct measures up to 3 mm. The pancreatic parenchyma is grossly unremarkable.  Spleen: Unremarkable  Adrenals/Urinary Tract: Normal adrenal glands. Kidneys enhance symmetrically with contrast. Exophytic simple cyst off the inferior pole of the left kidney. No hydronephrosis. Urinary bladder is markedly distended.  Stomach/Bowel: Stool present throughout the colon as can be seen with constipation. Sigmoid colonic diverticulosis without evidence for acute diverticulitis. Normal appendix. No evidence for small bowel obstruction. Mild gaseous distended loops of small bowel, potentially representing postoperative ileus.  Vascular/Lymphatic: Scattered calcified atherosclerotic plaque involving the abdominal aorta. No retroperitoneal lymphadenopathy.  Other: Perihepatic ascites.  No free intraperitoneal air.  Anasarca.  Musculoskeletal: Postoperative changes involving the posterior elements of the T11, T12 and L1 vertebral  bodies. No significant residual gas demonstrated within the spinal canal at these levels. There is fluid demonstrated within the overlying soft tissues. There is an overlying surgical staple line. Suggestion of interval decrease in previously described high attenuation acute blood products within the spinal canal.  IMPRESSION: 1. Findings compatible with acute pulmonary embolism within the right lower lobe pulmonary artery. 2. Postoperative changes from the T11 through L1 vertebral bodies. There is associated fluid within the overlying soft tissues, likely postoperative in etiology. Superimposed infection not excluded. 3. Anasarca and small amount of ascites. 4. Marked urinary bladder dilatation. Consider decompression with Foley catheter. 5. Stool throughout the colon as can be seen with constipation. Possible postoperative ileus. 6. Prominent pancreatic duct measuring up to 3 mm. Mild central intrahepatic biliary ductal dilatation. Recommend correlation with laboratory analysis. Critical Value/emergent results were called by telephone at the time of interpretation on 11/26/2014 at 7:59 pm to Dr. Dutch Quint, who verbally acknowledged these results.   Electronically Signed   By: Annia Belt M.D.   On: 11/26/2014 20:01    TELE AF with controlled rate  ASSESSMENT AND PLAN Continue current medical regimen. IVC filter  today. Venous Duplex pending. ASA for stroke prevention once OK with Neurosurgery.   Thurmon FairMihai Nehal Shives, MD, [facility] [phone] office [phone] pager 11/27/2014 11:02 AM

## 2014-11-27 NOTE — Progress Notes (Signed)
Patient ID: [name] Weber, female   DOB: 11/14/1931, 80 y.o.   MRN: [id number] Neuro stable. Ct chest and abdomen seen. Spoke wih dr Thompson([id number]. IVC filter pending for this afternoon. Ok to start asa

## 2014-11-28 DIAGNOSIS — I2699 Other pulmonary embolism without acute cor pulmonale: Secondary | ICD-10-CM

## 2014-11-28 MED ORDER — OXYCODONE-ACETAMINOPHEN 5-325 MG PO TABS
1.0000 | ORAL_TABLET | ORAL | Status: DC | PRN
Start: 2014-11-28 — End: 2014-11-29
  Administered 2014-11-28: 2 via ORAL
  Administered 2014-11-28 – 2014-11-29 (×2): 1 via ORAL
  Filled 2014-11-28: qty 1
  Filled 2014-11-28: qty 2
  Filled 2014-11-28 (×2): qty 1

## 2014-11-28 NOTE — Progress Notes (Signed)
  Patient Name: [name] Weber Date of Encounter: 11/28/2014  Active Problems:   Paroxysmal atrial flutter   Hypercholesterolemia   Paraplegia at T9 level   Weakness of both legs   Near syncope   Orthostatic hypotension   Traumatic spinal subdural hematoma   Acute pulmonary embolism   Persistent atrial fibrillation   Length of Stay: 12  SUBJECTIVE  Had uncomplicated IVC filter. Ultrasound shows mobile right common femoral vein DVT. AF with generally well controlled rate.  CURRENT MEDS . aspirin EC  81 mg Oral Daily  . atorvastatin  10 mg Oral Daily  . bisacodyl  10 mg Rectal q morning - 10a  . digoxin  0.125 mg Oral Daily  . feeding supplement (ENSURE COMPLETE)  237 mL Oral Q24H  . fludrocortisone  0.1 mg Oral Daily  . multivitamin with minerals  1 tablet Oral Daily  . sodium chloride  3 mL Intravenous Q12H  . sodium chloride  1 g Oral TID WC  . tamsulosin  0.8 mg Oral Daily    OBJECTIVE   Intake/Output Summary (Last 24 hours) at 11/28/14 1312 Last data filed at 11/28/14 0900  Gross per 24 hour  Intake   2300 ml  Output   1476 ml  Net    824 ml   Filed Weights   11/16/14 0928 11/17/14 0107  Weight: 108 lb (48.988 kg) 131 lb 13.4 oz (59.8 kg)    PHYSICAL EXAM Filed Vitals:   11/28/14 0900 11/28/14 1000 11/28/14 1040 11/28/14 1215  BP: 117/62 128/78    Pulse: 92 160 81   Temp:    98 F (36.7 C)  TempSrc:    Oral  Resp: 23 22    Height:      Weight:      SpO2: 100% 100%     General: Alert, oriented x3, no distress Head: no evidence of trauma, PERRL, EOMI, no exophtalmos or lid lag, no myxedema, no xanthelasma; normal ears, nose and oropharynx Neck: normal jugular venous pulsations and no hepatojugular reflux; brisk carotid pulses without delay and no carotid bruits Chest: clear to auscultation, no signs of consolidation by percussion or palpation, normal fremitus, symmetrical and full respiratory excursions Cardiovascular: normal position and quality  of the apical impulse, irregular rhythm, normal first and second heart sounds, no rubs or gallops, no murmur Abdomen: no tenderness or distention, no masses by palpation, no abnormal pulsatility or arterial bruits, normal bowel sounds, no hepatosplenomegaly Extremities: no clubbing, cyanosis or edema; 2+ radial, ulnar and brachial pulses bilaterally; 2+ right femoral, posterior tibial and dorsalis pedis pulses; 2+ left femoral, posterior tibial and dorsalis pedis pulses; no subclavian or femoral bruits Neurological: grossly nonfocal  LABS  CBC  Recent Labs  11/26/14 1015 11/27/14 1054  WBC 5.9 6.2  NEUTROABS 4.3  --   HGB 11.7* 12.0  HCT 34.4* 35.3*  MCV 96.6 97.0  PLT 193 214   Basic Metabolic Panel  Recent Labs  11/26/14 1015 11/27/14 1054  NA 131* 132*  K 3.8 3.5  CL 99 100  CO2 27 27  GLUCOSE 133* 114*  BUN 12 7  CREATININE 0.52 0.48*  CALCIUM 8.0* 7.9*    ASSESSMENT AND PLAN Plan is for inpatient rehab. Will monitor from a distance. Please call us back for new cardiac issues.  Start ASA 81 mg daily when neurosurgery believes it is safe.   Thurmon FairMihai [place] Doster, MD, [facility] [phone] office [phone] pager 11/28/2014 1:12 PM

## 2014-11-28 NOTE — Progress Notes (Signed)
Occupational Therapy Treatment Patient Details Name: [name] Weber MRN: [id number] DOB: 07/30/1931 Today's Date: 11/28/2014    History of present illness 80 yo female s/p epidural hematoma evacuation T11-12 cord compression by Dr Jeral FruitBotero. PMH: palpitations, PAC, PVS, Myalgia.  pt found to have PE and Bil LE DVTs, now post IVC Filter placed.     OT comments  Pt grateful for standing activities, transfer and seated grooming with OT/PT. Reports pain with mobility in upper thighs, but works through pain, highly motivated.  Pt continues to require +2 assist for mobility and requires support of her UEs with posterior lean in sitting. She can perform grooming with set up in supported sitting.  Continues to be an excellent rehab candidate.  Will continue to follow.  Follow Up Recommendations  CIR    Equipment Recommendations       Recommendations for Other Services      Precautions / Restrictions Precautions Precautions: Fall;Back Precaution Booklet Issued: No Precaution Comments: watch orthostatic BP Restrictions Weight Bearing Restrictions: No       Mobility Bed Mobility Overal bed mobility: Needs Assistance;+2 for physical assistance Bed Mobility: Rolling;Sidelying to Sit Rolling: Min assist Sidelying to sit: Max assist;+2 for physical assistance       General bed mobility comments: cues for log roll technique and sequencing bed mobility.    Transfers Overall transfer level: Needs assistance Equipment used: 2 person hand held assist Transfers: Sit to/from UGI CorporationStand;Stand Pivot Transfers Sit to Stand: Max assist;+2 physical assistance Stand pivot transfers: Max assist;+2 physical assistance       General transfer comment: cues for UE use and positioning of LEs.  pt unable to achieve full standing and leans her head on therapy staff.  pt incontinent of bowels in standing and required multiple stands for hygiene.      Balance Overall balance assessment: Needs  assistance Sitting-balance support: Bilateral upper extremity supported;Feet supported Sitting balance-Leahy Scale: Poor   Postural control: Posterior lean Standing balance support: During functional activity Standing balance-Leahy Scale: Poor                     ADL Overall ADL's : Needs assistance/impaired     Grooming: Oral care;Wash/dry face;Brushing hair;Sitting;Set up Grooming Details (indicate cue type and reason): supported sitting                     Toileting- Clothing Manipulation and Hygiene: Total assistance;Maximal assistance Toileting - Clothing Manipulation Details (indicate cue type and reason): pt without awareness of BM              Vision                     Perception     Praxis      Cognition   Behavior During Therapy: [facility] Classification CenterWFL for tasks assessed/performed Overall Cognitive Status: Impaired/Different from baseline Area of Impairment: Memory;Awareness;Problem solving     Memory: Decreased short-term memory      Awareness: Emergent Problem Solving: Slow processing;Difficulty sequencing;Requires verbal cues;Requires tactile cues      Extremity/Trunk Assessment               Exercises     Shoulder Instructions       General Comments      Pertinent Vitals/ Pain       Pain Assessment: Faces Faces Pain Scale: Hurts whole lot Pain Location: B thighs Pain Descriptors / Indicators: Discomfort Pain Intervention(s): Limited activity within patient's tolerance;Monitored  during session;Premedicated before session;Repositioned  Home Living                                          Prior Functioning/Environment              Frequency Min 2X/week     Progress Toward Goals  OT Goals(current goals can now be found in the care plan section)  Progress towards OT goals: Progressing toward goals  Acute Rehab OT Goals Patient Stated Goal: to get back to doing for myself - return home to dog  "hersey" Time For Goal Achievement: 12/03/14  Plan Discharge plan remains appropriate    Co-evaluation    PT/OT/SLP Co-Evaluation/Treatment: Yes Reason for Co-Treatment: Complexity of the patient's impairments (multi-system involvement);For patient/therapist safety PT goals addressed during session: Mobility/safety with mobility;Balance;Strengthening/ROM OT goals addressed during session: ADL's and self-care      End of Session Equipment Utilized During Treatment: Gait belt   Activity Tolerance Patient limited by pain   Patient Left in chair;with call bell/phone within reach   Nurse Communication Mobility status (pt with BM)        Time: [id number] OT Time Calculation (min): 42 min  Charges: OT General Charges $OT Visit: 1 Procedure OT Treatments $Self Care/Home Management : 8-22 mins $Therapeutic Activity: 8-22 mins  Evern BioMayberry, [name] Bambach Lynn 11/28/2014, 2:36 PM [phone]

## 2014-11-28 NOTE — Plan of Care (Signed)
Problem: Phase II Progression Outcomes Goal: Progressing with IS, TCDB Outcome: Completed/Met Date Met:  11/28/14 IS 1500

## 2014-11-28 NOTE — Clinical Social Work Note (Signed)
Clinical Social Worker continuing to follow patient and family for support and discharge planning needs.  Patient family hopeful for inpatient rehab placement, however [facility] in the event that patient is declined by insurance.  Inpatient rehab admissions coordinator awaiting follow up PT/OT notes to submit for insurance authorization.  FL2 to be updated prior to discharge.  CSW remains available for support and to facilitate patient discharge needs if deemed appropriate once medically stable.  Macario GoldsJesse Sharona Rovner, Kentucky[place]CS[place] [phone]

## 2014-11-28 NOTE — Progress Notes (Signed)
UR completed. Awaiting insurance auth for CIR transfer.   Carlyle LipaMichelle Eliani Leclere, RN BSN MHA CCM Trauma/Neuro ICU Case Manager [phone]

## 2014-11-28 NOTE — Progress Notes (Signed)
Rehab admissions - I spoke with the insurance case manager.  We will need updated PT and OT notes either today or early tomorrow morning in order for me to get patient considered for inpatient rehab admission.  I have called the therapy dept to request these updates.  Call me for questions.  [phone]

## 2014-11-28 NOTE — PMR Pre-admission (Signed)
PMR Admission Coordinator Pre-Admission Assessment  Patient: [name] Weber is an 80 y.o., female MRN: [id number] DOB: 12-23-1930 Height: _0  (167.6 cm) Weight: 59.8 kg (131 lb 13.4 oz)              Insurance Information HMO:      PPO: Yes     PCP:       IPA:       80/20:       OTHER: Group # [id number] PRIMARY: AARP Medicare complete      Policy#: [id number]      Subscriber: [name] CM Name: Geryl Rankins      Phone#: [phone]     Fax#:   Pre-Cert#: [id number]      Employer:  Not employed Benefits:  Phone #: [phone]     Name:  Automated Eff. Date: 08/30/14     Deduct: $0      Out of Pocket Max: $4500 (met $559.68)      Life Max: unlimited CIR: $345 days 1-5      SNF: $0 days 1-20; $155 days 21-50; $0 days 51-100 Outpatient: medical necessity     Co-Pay: $40/visit per therapy Home Health: 100%      Co-Pay: none DME: 80%     Co-Pay: 20% Providers: in network  Emergency Contact Information Contact Information    Name Relation Home Work Mobile   [place] Son   [phone]   Yaeger,Fay Other   [phone]   Johnnette Litter Sister [phone]     [place] Section Niece [phone]     Myrlene Broker   [phone]   [place],Via Friend   [phone]     Current Medical History  Patient Admitting Diagnosis: T10-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 female with a history of PAF on coumadin, HLD and no other real significant medical history who presented to 11/16/14 with new low back pain with weakness and inability to walk. MRI of spine with abnormal cord signal due to hemorrhage/hematoma in the ventral spinal canal T 10- T 12 causing spinal chord compression. Her coumadin was emergently reversed and patient underwent thoracic laminectomy T 11 -T 12 decompression of epidural space and evacuation of SDH by Dr. Joya Salm the same day. Post op with A flutter and cardiology assisting with rate control and BP management. he had  transient improvement in BLE weakness and follow up CT lumbar/thoracis spine reveals post op changes with with hyperdense blood products in lower lumbar spine. She developed hypotension requiring florinef and cardizem changed to digoxin to help with symptoms. She continues to have orthostatic changes affecting mobility and IVF added for support. Follow up X rays of abdomen and spine ordered due to ongoing pain issues. CCS consulted for input on abdominal pain and CT abdomen/pelvis ordered for workup and revealed RLL PE as well as prominent pancreatic duct as well as possible ileus. Patient was transferred to ICU for closer monitoring and BLE dopplers with mobile DVT in CFV and in gastrocnemius as well as L-DVT in soleus vein. IVC filter placed by radiology on 12/29 and patient cleared to start ASA for stroke prevention per Dr. Joya Salm.  Patient with resultant paraparesis as well as bowel/bladder incontinence. MD, family and rehab team requesting CIR for progressive therapy. Family now indicating ability to provide 24/7 care past discharge.     Past Medical History  Past Medical History  Diagnosis Date  . Paroxysmal atrial flutter   . Palpitations     OCCASSIONAL  .  PAC (premature atrial contraction)     ISOLATED  . PVC's (premature ventricular contractions)     ISOLATED  . Malaise   . Fatigue   . Myalgia     Family History  family history includes Hyperlipidemia in her mother.  Prior Rehab/Hospitalizations:  No previous rehab.   Current Medications  Current facility-administered medications: 0.9 %  sodium chloride infusion, , Intravenous, Continuous, Lelon Perla, MD, Last Rate: 100 mL/hr at 11/29/14 1100;  acetaminophen (TYLENOL) tablet 650 mg, 650 mg, Oral, Q4H PRN, 650 mg at 11/20/14 1621 **OR** acetaminophen (TYLENOL) suppository 650 mg, 650 mg, Rectal, Q4H PRN, Floyce Stakes, MD alum & mag hydroxide-simeth (MAALOX/MYLANTA) 200-200-20 MG/5ML suspension 30 mL, 30 mL, Oral, Q6H PRN,  Floyce Stakes, MD;  aspirin EC tablet 81 mg, 81 mg, Oral, Daily, Floyce Stakes, MD, 81 mg at 11/29/14 1045;  atorvastatin (LIPITOR) tablet 10 mg, 10 mg, Oral, Daily, Floyce Stakes, MD, 10 mg at 11/29/14 1045;  bisacodyl (DULCOLAX) suppository 10 mg, 10 mg, Rectal, q morning - 10a, Floyce Stakes, MD, Stopped at 11/29/14 1045 diazepam (VALIUM) tablet 5 mg, 5 mg, Oral, Q6H PRN, Floyce Stakes, MD, 5 mg at 11/27/14 2013;  digoxin (LANOXIN) tablet 0.125 mg, 0.125 mg, Oral, Daily, Carlena Bjornstad, MD, 0.125 mg at 11/29/14 1045;  diphenhydrAMINE (BENADRYL) capsule 25 mg, 25 mg, Oral, Q4H PRN, Kristeen Miss, MD, 25 mg at 11/18/14 0008;  diphenhydrAMINE (BENADRYL) injection 12.5 mg, 12.5 mg, Intravenous, Q6H PRN, Kristeen Miss, MD, 12.5 mg at 11/18/14 0750 feeding supplement (ENSURE COMPLETE) (ENSURE COMPLETE) liquid 237 mL, 237 mL, Oral, Q24H, Baird Lyons, RD, 237 mL at 11/28/14 2046;  fludrocortisone (FLORINEF) tablet 0.1 mg, 0.1 mg, Oral, Daily, Carlena Bjornstad, MD, 0.1 mg at 11/29/14 1045;  menthol-cetylpyridinium (CEPACOL) lozenge 3 mg, 1 lozenge, Oral, PRN **OR** phenol (CHLORASEPTIC) mouth spray 1 spray, 1 spray, Mouth/Throat, PRN, Floyce Stakes, MD morphine 2 MG/ML injection 1-4 mg, 1-4 mg, Intravenous, Q3H PRN, Floyce Stakes, MD, 1 mg at 11/29/14 4401;  multivitamin with minerals tablet 1 tablet, 1 tablet, Oral, Daily, Baird Lyons, RD, 1 tablet at 11/29/14 1045;  ondansetron (ZOFRAN) injection 4 mg, 4 mg, Intravenous, Q4H PRN, Floyce Stakes, MD, 4 mg at 11/26/14 0430 oxyCODONE-acetaminophen (PERCOCET/ROXICET) 5-325 MG per tablet 1-2 tablet, 1-2 tablet, Oral, Q4H PRN, Floyce Stakes, MD, 1 tablet at 11/29/14 0602;  promethazine (PHENERGAN) injection 12.5 mg, 12.5 mg, Intravenous, Q4H PRN, Kristeen Miss, MD;  sodium chloride 0.9 % injection 3 mL, 3 mL, Intravenous, Q12H, Floyce Stakes, MD, 3 mL at 11/29/14 0334 sodium chloride 0.9 % injection 3 mL, 3 mL, Intravenous, PRN, Floyce Stakes, MD, 3 mL at 11/19/14 1042;  sodium chloride tablet 1 g, 1 g, Oral, TID WC, Consuella Lose, MD, 1 g at 11/29/14 0845;  sodium phosphate (FLEET) 7-19 GM/118ML enema 1 enema, 1 enema, Rectal, Daily PRN, Floyce Stakes, MD, 1 enema at 11/29/14 0272;  tamsulosin [employer]) capsule 0.8 mg, 0.8 mg, Oral, Daily, Floyce Stakes, MD, 0.8 mg at 11/29/14 1045  Patients Current Diet: Diet regular  Precautions / Restrictions Precautions Precautions: Fall, Back Precaution Booklet Issued: No Precaution Comments: watch orthostatic BP Restrictions Weight Bearing Restrictions: No   Prior Activity Level Community (5-7x/wk): Went out daily.  Was a Insurance underwriter.  Walked 5 miles per day.  Wears a FitBit.   Home Assistive Devices / Equipment Home Assistive Devices/Equipment: Eyeglasses, Communication device (specify type) (hearing  aids)  Prior Functional Level Prior Function Level of Independence: Independent  Current Functional Level Cognition  Overall Cognitive Status: Impaired/Different from baseline Current Attention Level: Focused Orientation Level: Oriented X4 Following Commands: Follows one step commands inconsistently General Comments: Pt reports OT helped patient to chair on sunday 11/25/14. Ot reports to patient no we havent worked together since last week. Pt states "oh it was this other girl then " and points to rehab tech present. Pt unable to report accurately to sister food that was eaten or events that occurred. Pt with incr confusion compared to previous session with OT. Pt also with lack of awareness to bowel and bladder uncontrolled    Extremity Assessment (includes Sensation/Coordination)  Upper Extremity Assessment: Defer to OT evaluation Lower Extremity Assessment: RLE deficits/detail;LLE deficits/detail RLE Deficits / Details: Hip strength grossly 4-/5, knee and ankle strength grossly 3/5. pt with decreased coordination.  LLE Deficits / Details: hip strength  grossly 4-/5, knee/ankle strength grossly 3/5. Decreased coordination.  Cervical / Trunk Assessment: Normal     ADLs  Overall ADL's : Needs assistance/impaired Grooming: Oral care, Wash/dry face, Brushing hair, Sitting, Set up Grooming Details (indicate cue type and reason): supported sitting Toilet Transfer: +2 for physical assistance, Moderate assistance Toilet Transfer Details (indicate cue type and reason): required ace wraps on bil LE and SCD applied to due orthostatic BP Toileting- Clothing Manipulation and Hygiene: Total assistance, Maximal assistance Toileting - Clothing Manipulation Details (indicate cue type and reason): pt without awareness of BM Functional mobility during ADLs: Moderate assistance, +2 for physical assistance, Rolling walker General ADL Comments: Pt total (A) for peri care due to incontinence of bowel / bladder. Pt with new onset of bowel/ bladder starting previous week and has incr. OT unable to obtain orthostatic BP due to incontinence. Pt reports pain limiting all mobility. pt unabel to tolerate Eob sitting due to pain without support. Sister present and very concerned with pending d/c options due to phone call from [facility] stating pending arrival to their facilty today. Sister does not want patient to d/c to [facility] and prefers inpatient rehab. CM Hassan Rowan arriving and discussing with sister POA Remo Lipps during end of session.    Mobility  Overal bed mobility: Needs Assistance, +2 for physical assistance Bed Mobility: Rolling, Sidelying to Sit Rolling: Min assist Sidelying to sit: Max assist, +2 for physical assistance Supine to sit: Max assist Sit to supine: +2 for physical assistance, Max assist General bed mobility comments: cues for log roll technique and sequencing bed mobility.      Transfers  Overall transfer level: Needs assistance Equipment used: 2 person hand held assist Transfers: Sit to/from Stand, Stand Pivot Transfers Sit to Stand: Max  assist, +2 physical assistance Stand pivot transfers: Max assist, +2 physical assistance General transfer comment: cues for UE use and positioning of LEs.  pt unable to achieve full standing and leans her head on therapy staff.  pt incontinent of bowels in standing and required multiple stands for hygiene.      Ambulation / Gait / Stairs / Wheelchair Mobility  Ambulation/Gait Ambulation/Gait assistance: Mod assist, +2 safety/equipment Ambulation Distance (Feet): 50 Feet (10) Assistive device: Rolling walker (2 wheeled) Gait Pattern/deviations: Step-to pattern, Ataxic, Decreased weight shift to right, Decreased weight shift to left Gait velocity: decreased General Gait Details: unable due to BP    Posture / Balance Dynamic Sitting Balance Sitting balance - Comments: pt requires support to stay EOB    Special needs/care consideration BiPAP/CPAP No  CPM No Continuous Drip IV 0.9% NS 100 ml/hr Dialysis No        Life Vest No Oxygen No Special Bed No Trach Size No Wound Vac (area) No      Skin Dressing to coccyx for protection                             Bowel mgmt: Last BM 11/28/14 Bladder mgmt: Urinary catheter in place Diabetic mgmt No    Previous Home Environment Living Arrangements: Alone Home Care Services: No  Discharge Living Setting Plans for Discharge Living Setting: Patient's home, Other (Comment) (Lives in a condominium on the second level.) Type of Home at Discharge: Apartment Discharge Home Layout: One level Discharge Home Access: Stairs to enter Entrance Stairs-Number of Steps: 14 steps to entrance of condo, then all on one level. Does the patient have any problems obtaining your medications?: No  Social/Family/Support Systems Patient Roles: Parent (Has a son, dtr-in-law and a niece.) Contact Information: Kismet Facemire - [phone] Anticipated Caregiver: Dolly Rias - dtr-in-law Anticipated Caregiver's Contact Information: Massie Maroon  [phone] Ability/Limitations of Caregiver: Dtr-in-law plans to go stay with patient and care for her after rehab stay Caregiver Availability: 24/7 Discharge Plan Discussed with Primary Caregiver: Yes Is Caregiver In Agreement with Plan?: Yes Does Caregiver/Family have Issues with Lodging/Transportation while Pt is in Rehab?: No  Goals/Additional Needs Patient/Family Goal for Rehab: PT mod I, OT mod I/Supervision goals Expected length of stay: 15-20 days Cultural Considerations: None Dietary Needs: Regular diet, thin liquids Equipment Needs: TBD Pt/Family Agrees to Admission and willing to participate: Yes Program Orientation Provided & Reviewed with Pt/Caregiver Including Roles  & Responsibilities: Yes  Decrease burden of Care through IP rehab admission: N/A  Possible need for SNF placement upon discharge: Not planned  Patient Condition: This patient's medical and functional status has changed since the consult dated: 11/27/14 in which the Rehabilitation Physician determined and documented that the patient's condition is appropriate for intensive rehabilitative care in an inpatient rehabilitation facility. See "History of Present Illness" (above) for medical update. Functional changes are: Currently requiring max assist +2 for stand pivot transfers. Patient's medical and functional status update has been discussed with the Rehabilitation physician and patient remains appropriate for inpatient rehabilitation. Will admit to inpatient rehab today.  Preadmission Screen Completed By:  [name], 11/29/2014 12:03 PM ______________________________________________________________________   Discussed status with Dr. Naaman Plummer on 11/29/14 at 1049 and received telephone approval for admission today.  Admission Coordinator:  [name], time1204/Date12/31/15

## 2014-11-28 NOTE — Progress Notes (Signed)
Physical Therapy Treatment Patient Details Name: Stephani Policevelyn C Sterling MRN: [id number] DOB: 09/18/1931 Today's Date: 11/28/2014    History of Present Illness 80 yo female s/p epidural hematoma evacuation T11-12 cord compression by Dr Jeral FruitBotero. PMH: palpitations, PAC, PVS, Myalgia.  pt found to have PE and Bil LE DVTs, now post IVC Filter placed.      PT Comments    Pt very motivated to work on mobility, but limited by pain in bil thighs, R side and back.  Pt incontinent of bowels during mobility and needed total A for hygiene.  Pt Bil LEs buckling and trembles during mobility.  Feel pt would benefit from CIR at D/C to maximize independence prior to returning to home.  Will continue to follow.    Follow Up Recommendations  CIR     Equipment Recommendations  None recommended by PT    Recommendations for Other Services       Precautions / Restrictions Precautions Precautions: Fall;Back Precaution Booklet Issued: No Restrictions Weight Bearing Restrictions: No    Mobility  Bed Mobility Overal bed mobility: Needs Assistance;+2 for physical assistance Bed Mobility: Rolling;Sidelying to Sit Rolling: Min assist Sidelying to sit: Max assist;+2 for physical assistance       General bed mobility comments: cues for log roll technique and sequencing bed mobility.    Transfers Overall transfer level: Needs assistance Equipment used: 2 person hand held assist Transfers: Sit to/from UGI CorporationStand;Stand Pivot Transfers Sit to Stand: Max assist;+2 physical assistance Stand pivot transfers: Max assist;+2 physical assistance       General transfer comment: cues for UE use and positioning of LEs.  pt unable to achieve full standing and leans her head on therapy staff.  pt incontinent of bowels in standing and required multiple stands for hygiene.    Ambulation/Gait                 Stairs            Wheelchair Mobility    Modified Rankin (Stroke Patients Only)       Balance  Overall balance assessment: Needs assistance Sitting-balance support: Bilateral upper extremity supported;Feet supported Sitting balance-Leahy Scale: Poor     Standing balance support: During functional activity Standing balance-Leahy Scale: Poor                      Cognition Arousal/Alertness: Awake/alert Behavior During Therapy: WFL for tasks assessed/performed Overall Cognitive Status: Impaired/Different from baseline Area of Impairment: Memory;Awareness;Problem solving     Memory: Decreased short-term memory     Awareness: Emergent Problem Solving: Slow processing;Difficulty sequencing;Requires verbal cues;Requires tactile cues      Exercises      General Comments        Pertinent Vitals/Pain Pain Assessment: Faces Faces Pain Scale: Hurts whole lot Pain Location: Bil thighs anteriorly and posteriorly, R flank, and back.   Pain Descriptors / Indicators: Discomfort Pain Intervention(s): Monitored during session;Premedicated before session;Repositioned    Home Living                      Prior Function            PT Goals (current goals can now be found in the care plan section) Acute Rehab PT Goals Patient Stated Goal: to get back to doing for myself - return home to dog "hersey" PT Goal Formulation: With patient Time For Goal Achievement: 12/01/14 Potential to Achieve Goals: Good Progress towards PT goals: Progressing toward goals  Frequency  Min 3X/week    PT Plan Discharge plan needs to be updated    Co-evaluation PT/OT/SLP Co-Evaluation/Treatment: Yes Reason for Co-Treatment: Complexity of the patient's impairments (multi-system involvement) PT goals addressed during session: Mobility/safety with mobility;Balance;Strengthening/ROM       End of Session Equipment Utilized During Treatment: Gait belt Activity Tolerance: Patient limited by fatigue;Patient limited by pain Patient left: in chair;with call bell/phone within reach      Time: 1343-1409 PT Time Calculation (min) (ACUTE ONLY): 26 min  Charges:  $Therapeutic Activity: 8-22 mins                    G CodesSunny Schlein:      Ian Castagna F, [place] CarolinaPT [phone] 11/28/2014, 2:25 PM

## 2014-11-28 NOTE — Progress Notes (Signed)
Patient ID: [name] Weber, female   DOB: 09/04/1931, 80 y.o.   MRN: [id number] Neuro stable. Still some abdominal pain. Cleared by surgery. Enema  Today. Rehabilitation to see

## 2014-11-28 NOTE — Progress Notes (Signed)
Rehab admissions - I met with patient, son and patient's niece this am.  All are in agreement to inpatient rehab.  I left them rehab brochures.  I am waiting for call back from insurance case manager for possible acute inpatient rehab admission.  I will update all when I hear back from insurance carrier.  Call me for questions.  [phone]

## 2014-11-28 NOTE — Progress Notes (Signed)
Rehab admissions - I have faxed updates to insurance carrier.  I hope to have an answer tomorrow and be able to admit to acute inpatient rehab tomorrow.  Call me for questions.  #[phone]

## 2014-11-29 ENCOUNTER — Inpatient Hospital Stay (HOSPITAL_COMMUNITY)
Admission: RE | Admit: 2014-11-29 | Discharge: 2014-12-17 | DRG: 052 | Disposition: A | Discharge status: Skilled Nursing Facility | Payer: Medicare Other | Source: Intra-hospital | Attending: Physical Medicine & Rehabilitation | Admitting: Physical Medicine & Rehabilitation

## 2014-11-29 ENCOUNTER — Inpatient Hospital Stay (HOSPITAL_COMMUNITY): Payer: Medicare Other

## 2014-11-29 DIAGNOSIS — G8220 Paraplegia, unspecified: Secondary | ICD-10-CM | POA: Diagnosis present

## 2014-11-29 DIAGNOSIS — I4891 Unspecified atrial fibrillation: Secondary | ICD-10-CM | POA: Diagnosis present

## 2014-11-29 DIAGNOSIS — M4714 Other spondylosis with myelopathy, thoracic region: Secondary | ICD-10-CM

## 2014-11-29 DIAGNOSIS — K567 Ileus, unspecified: Secondary | ICD-10-CM | POA: Diagnosis present

## 2014-11-29 DIAGNOSIS — Z09 Encounter for follow-up examination after completed treatment for conditions other than malignant neoplasm: Secondary | ICD-10-CM

## 2014-11-29 DIAGNOSIS — A499 Bacterial infection, unspecified: Secondary | ICD-10-CM | POA: Insufficient documentation

## 2014-11-29 DIAGNOSIS — I959 Hypotension, unspecified: Secondary | ICD-10-CM | POA: Diagnosis present

## 2014-11-29 DIAGNOSIS — K592 Neurogenic bowel, not elsewhere classified: Secondary | ICD-10-CM | POA: Diagnosis present

## 2014-11-29 DIAGNOSIS — E876 Hypokalemia: Secondary | ICD-10-CM | POA: Diagnosis present

## 2014-11-29 DIAGNOSIS — B9620 Unspecified Escherichia coli [E. coli] as the cause of diseases classified elsewhere: Secondary | ICD-10-CM | POA: Diagnosis present

## 2014-11-29 DIAGNOSIS — F411 Generalized anxiety disorder: Secondary | ICD-10-CM

## 2014-11-29 DIAGNOSIS — E871 Hypo-osmolality and hyponatremia: Secondary | ICD-10-CM | POA: Diagnosis present

## 2014-11-29 DIAGNOSIS — N390 Urinary tract infection, site not specified: Secondary | ICD-10-CM | POA: Diagnosis present

## 2014-11-29 DIAGNOSIS — G839 Paralytic syndrome, unspecified: Secondary | ICD-10-CM | POA: Diagnosis not present

## 2014-11-29 DIAGNOSIS — N319 Neuromuscular dysfunction of bladder, unspecified: Secondary | ICD-10-CM | POA: Diagnosis present

## 2014-11-29 DIAGNOSIS — R32 Unspecified urinary incontinence: Secondary | ICD-10-CM | POA: Diagnosis present

## 2014-11-29 HISTORY — DX: Other spondylosis with myelopathy, thoracic region: M47.14

## 2014-11-29 LAB — URINE CULTURE: Colony Count: >=100000

## 2014-11-29 MED ORDER — ADULT MULTIVITAMIN W/MINERALS CH
1.0000 | ORAL_TABLET | Freq: Every day | ORAL | Status: DC
  Administered 2014-11-30 – 2014-12-17 (×18): 1 via ORAL
  Filled 2014-11-29 (×20): qty 1

## 2014-11-29 MED ORDER — BISACODYL 10 MG RE SUPP
10.0000 mg | Freq: Every morning | RECTAL | Status: DC
  Administered 2014-11-30 – 2014-12-16 (×17): 10 mg via RECTAL
  Filled 2014-11-29 (×19): qty 1

## 2014-11-29 MED ORDER — GUAIFENESIN-DM 100-10 MG/5ML PO SYRP: 5.0000 mL | ORAL_SOLUTION | Freq: Four times a day (QID) | ORAL | Status: DC | PRN

## 2014-11-29 MED ORDER — POLYETHYLENE GLYCOL 3350 17 G PO PACK
17.0000 g | PACK | Freq: Every day | ORAL | Status: DC
  Administered 2014-11-29 – 2014-12-03 (×5): 17 g via ORAL
  Filled 2014-11-29 (×7): qty 1

## 2014-11-29 MED ORDER — TAMSULOSIN HCL 0.4 MG PO CAPS
0.8000 mg | ORAL_CAPSULE | Freq: Every day | ORAL | Status: DC
  Administered 2014-11-30 – 2014-12-16 (×17): 0.8 mg via ORAL
  Filled 2014-11-29 (×18): qty 2

## 2014-11-29 MED ORDER — SODIUM CHLORIDE 1 G PO TABS
1.0000 g | ORAL_TABLET | Freq: Three times a day (TID) | ORAL | Status: DC
  Administered 2014-11-29 – 2014-12-01 (×7): 1 g via ORAL
  Filled 2014-11-29 (×11): qty 1

## 2014-11-29 MED ORDER — ALUM & MAG HYDROXIDE-SIMETH 200-200-20 MG/5ML PO SUSP
30.0000 mL | Freq: Four times a day (QID) | ORAL | Status: DC | PRN
  Administered 2014-11-29: 30 mL via ORAL
  Filled 2014-11-29: qty 30

## 2014-11-29 MED ORDER — METHOCARBAMOL 500 MG PO TABS
500.0000 mg | ORAL_TABLET | Freq: Four times a day (QID) | ORAL | Status: DC | PRN
  Administered 2014-12-01 – 2014-12-04 (×4): 500 mg via ORAL
  Filled 2014-11-29 (×4): qty 1

## 2014-11-29 MED ORDER — ASPIRIN EC 81 MG PO TBEC
81.0000 mg | DELAYED_RELEASE_TABLET | Freq: Every day | ORAL | Status: DC
  Administered 2014-11-30 – 2014-12-17 (×18): 81 mg via ORAL
  Filled 2014-11-29 (×19): qty 1

## 2014-11-29 MED ORDER — FLEET ENEMA 7-19 GM/118ML RE ENEM
1.0000 | ENEMA | Freq: Every day | RECTAL | Status: DC | PRN
  Administered 2014-11-29: 1 via RECTAL
  Filled 2014-11-29 (×3): qty 1

## 2014-11-29 MED ORDER — FLUDROCORTISONE ACETATE 0.1 MG PO TABS
0.1000 mg | ORAL_TABLET | Freq: Every day | ORAL | Status: DC
  Administered 2014-11-30 – 2014-12-14 (×15): 0.1 mg via ORAL
  Filled 2014-11-29 (×17): qty 1

## 2014-11-29 MED ORDER — PHENOL 1.4 % MT LIQD: 1.0000 | OROMUCOSAL | Status: DC | PRN

## 2014-11-29 MED ORDER — ACETAMINOPHEN 325 MG PO TABS
325.0000 mg | ORAL_TABLET | ORAL | Status: DC | PRN
  Administered 2014-12-07 – 2014-12-09 (×2): 650 mg via ORAL
  Administered 2014-12-10: 325 mg via ORAL
  Filled 2014-11-29 (×3): qty 2

## 2014-11-29 MED ORDER — TRAZODONE HCL 50 MG PO TABS: 25.0000 mg | ORAL_TABLET | Freq: Every evening | ORAL | Status: DC | PRN

## 2014-11-29 MED ORDER — ENSURE COMPLETE PO LIQD
237.0000 mL | ORAL | Status: DC
  Administered 2014-11-29 – 2014-12-16 (×12): 237 mL via ORAL

## 2014-11-29 MED ORDER — POLYETHYLENE GLYCOL 3350 17 G PO PACK: 17.0000 g | PACK | Freq: Every day | ORAL | Status: DC

## 2014-11-29 MED ORDER — DIGOXIN 125 MCG PO TABS
0.1250 mg | ORAL_TABLET | Freq: Every day | ORAL | Status: DC
  Administered 2014-11-30 – 2014-12-17 (×18): 0.125 mg via ORAL
  Filled 2014-11-29 (×19): qty 1

## 2014-11-29 MED ORDER — DIPHENHYDRAMINE HCL 12.5 MG/5ML PO ELIX: 12.5000 mg | ORAL_SOLUTION | Freq: Four times a day (QID) | ORAL | Status: DC | PRN

## 2014-11-29 MED ORDER — PROCHLORPERAZINE 25 MG RE SUPP: 12.5000 mg | Freq: Four times a day (QID) | RECTAL | Status: DC | PRN

## 2014-11-29 MED ORDER — MENTHOL 3 MG MT LOZG: 1.0000 | LOZENGE | OROMUCOSAL | Status: DC | PRN

## 2014-11-29 MED ORDER — OXYCODONE-ACETAMINOPHEN 5-325 MG PO TABS
1.0000 | ORAL_TABLET | Freq: Four times a day (QID) | ORAL | Status: DC | PRN
  Administered 2014-11-29 – 2014-12-01 (×4): 2 via ORAL
  Administered 2014-12-02: 1 via ORAL
  Administered 2014-12-02 (×2): 2 via ORAL
  Administered 2014-12-03 – 2014-12-04 (×2): 1 via ORAL
  Administered 2014-12-05 – 2014-12-08 (×7): 2 via ORAL
  Filled 2014-11-29 (×5): qty 2
  Filled 2014-11-29: qty 1
  Filled 2014-11-29 (×7): qty 2
  Filled 2014-11-29: qty 1
  Filled 2014-11-29 (×3): qty 2

## 2014-11-29 MED ORDER — ATORVASTATIN CALCIUM 10 MG PO TABS
10.0000 mg | ORAL_TABLET | Freq: Every day | ORAL | Status: DC
  Administered 2014-11-30 – 2014-12-17 (×18): 10 mg via ORAL
  Filled 2014-11-29 (×19): qty 1

## 2014-11-29 MED ORDER — TRAMADOL HCL 50 MG PO TABS
50.0000 mg | ORAL_TABLET | Freq: Four times a day (QID) | ORAL | Status: DC | PRN
  Administered 2014-11-29 – 2014-12-04 (×2): 50 mg via ORAL
  Filled 2014-11-29 (×3): qty 1

## 2014-11-29 MED ORDER — PROCHLORPERAZINE MALEATE 5 MG PO TABS
5.0000 mg | ORAL_TABLET | Freq: Four times a day (QID) | ORAL | Status: DC | PRN
  Administered 2014-11-30: 10 mg via ORAL
  Filled 2014-11-29 (×2): qty 2

## 2014-11-29 MED ORDER — CEFIXIME 400 MG PO TABS
400.0000 mg | ORAL_TABLET | Freq: Every day | ORAL | Status: AC
  Administered 2014-11-29 – 2014-12-03 (×5): 400 mg via ORAL
  Filled 2014-11-29 (×5): qty 1

## 2014-11-29 MED ORDER — PROCHLORPERAZINE EDISYLATE 5 MG/ML IJ SOLN: 5.0000 mg | Freq: Four times a day (QID) | INTRAMUSCULAR | Status: DC | PRN

## 2014-11-29 MED ORDER — FLEET ENEMA 7-19 GM/118ML RE ENEM: 1.0000 | ENEMA | Freq: Every day | RECTAL | Status: DC | PRN

## 2014-11-29 NOTE — Progress Notes (Signed)
Physical Therapy Treatment Patient Details Name: [name] Weber MRN: [id number] DOB: 08/07/1931 Today's Date: 11/29/2014    History of Present Illness 80 yo female s/p epidural hematoma evacuation T11-12 cord compression by Dr Jeral FruitBotero. PMH: palpitations, PAC, PVS, Myalgia.  pt found to have PE and Bil LE DVTs, now post IVC Filter placed.      PT Comments    Pt continues with pain limiting mobility, but today mostly pain in her back.  Pt anticipates D/C to CIR today.  Will continue to follow if remains on acute.    Follow Up Recommendations  CIR     Equipment Recommendations  None recommended by PT    Recommendations for Other Services       Precautions / Restrictions Precautions Precautions: Fall;Back Precaution Booklet Issued: No Restrictions Weight Bearing Restrictions: No    Mobility  Bed Mobility Overal bed mobility: Needs Assistance;+2 for physical assistance Bed Mobility: Rolling;Sidelying to Sit Rolling: Min assist Sidelying to sit: Max assist;+2 for physical assistance       General bed mobility comments: cues for log roll technique and sequencing bed mobility.    Transfers Overall transfer level: Needs assistance Equipment used: 2 person hand held assist Transfers: Sit to/from UGI CorporationStand;Stand Pivot Transfers Sit to Stand: Max assist;+2 physical assistance Stand pivot transfers: Max assist;+2 physical assistance       General transfer comment: cues for UE use and positioning of LEs.  pt unable to achieve full standing and leans her head on therapy staff.    Ambulation/Gait                 Stairs            Wheelchair Mobility    Modified Rankin (Stroke Patients Only)       Balance Overall balance assessment: Needs assistance Sitting-balance support: Bilateral upper extremity supported;Feet supported Sitting balance-Leahy Scale: Poor     Standing balance support: During functional activity Standing balance-Leahy Scale: Poor                       Cognition Arousal/Alertness: Awake/alert Behavior During Therapy: WFL for tasks assessed/performed Overall Cognitive Status: Impaired/Different from baseline Area of Impairment: Memory;Awareness;Problem solving     Memory: Decreased short-term memory     Awareness: Emergent Problem Solving: Slow processing;Difficulty sequencing;Requires verbal cues;Requires tactile cues      Exercises      General Comments        Pertinent Vitals/Pain Pain Assessment: Faces Faces Pain Scale: Hurts whole lot Pain Location: Low back Pain Descriptors / Indicators: Grimacing;Guarding Pain Intervention(s): Monitored during session;Premedicated before session;Repositioned    Home Living                      Prior Function            PT Goals (current goals can now be found in the care plan section) Acute Rehab PT Goals Patient Stated Goal: to get back to doing for myself - return home to dog "hersey" PT Goal Formulation: With patient Time For Goal Achievement: 12/01/14 Potential to Achieve Goals: Good Progress towards PT goals: Progressing toward goals    Frequency  Min 3X/week    PT Plan Current plan remains appropriate    Co-evaluation             End of Session Equipment Utilized During Treatment: Gait belt Activity Tolerance: Patient limited by pain;Patient limited by fatigue Patient left: in chair;with call  bell/phone within reach     Time: 1353-1421 PT Time Calculation (min) (ACUTE ONLY): 28 min  Charges:  $Therapeutic Activity: 23-37 mins                    G CodesSunny Schlein:      Lylliana Kitamura F, [place]PT [phone] 11/29/2014, 2:45 PM

## 2014-11-29 NOTE — Progress Notes (Signed)
Rehab admissions - I have approval for acute inpatient rehab admission.  Bed available and will admit to rehab today.  Call me for questions.  #[phone]

## 2014-11-29 NOTE — Progress Notes (Signed)
Patient ID: [name] Weber, female   DOB: 03/27/1931, 80 y.o.   MRN: [id number] Better, no abdominal pain  To rehabilitation

## 2014-11-29 NOTE — Plan of Care (Signed)
Problem: Phase III Progression Outcomes Goal: Discharge plan remains appropriate-arrangements made Outcome: Completed/Met Date Met:  11/29/14 Transferring to inpatient rehab today

## 2014-11-29 NOTE — H&P (Signed)
Physical Medicine and Rehabilitation Admission H&P   Chief Complaint  Patient presents with  . Thoracic hematoma with myelopathy and paraplegia.     HPI: [name] Weber is a 80 y.o. female with a history of PAF on coumadin, HLD and no other real significant medical history who presented to 11/16/14 with new low back pain with weakness and inability to walk. MRI of spine with abnormal cord signal due to hemorrhage/hematoma in the ventral spinal canal T 10- T 12 causing spinal chord compression. Her coumadin was emergently reversed and patient underwent thoracic laminectomy T 11 -T 12 decompression of epidural space and evacuation of SDH by Dr. Joya Salm the same day. Post op with A flutter and cardiology assisting with rate control and BP management. he had transient improvement in BLE weakness and follow up CT lumbar/thoracis spine reveals post op changes with with hyperdense blood products in lower lumbar spine. She developed hypotension requiring florinef for BP support and cardizem changed to digoxin to help with A flutter. She continues to have orthostatic changes affecting mobility and IVF was added for support. CCS consulted for input on ongoing RLQ/abdominal pain and CT abdomen/pelvis ordered for workup and revealed RLL PE as well as prominent pancreatic duct, markedly distension bladder and stool throughout the colon with possible ileus. Patient was transferred to ICU for closer monitoring and BLE dopplers with mobile DVT in CFV and in gastrocnemius as well as L-DVT in soleus vein. IVC filter placed by radiology on 12/29 and patient cleared to start ASA for stroke prevention per Dr. Joya Salm.    Patient with resultant paraparesis as well as bowel/bladder incontinence. MD, family and rehab team requesting CIR for progressive therapy. Family now indicating ability to provide 24/7 care past discharge.    Review of Systems  HENT: Negative for hearing loss.  Eyes: Negative for  blurred vision and double vision.  Respiratory: Positive for cough. Negative for shortness of breath.  Cardiovascular: Negative for chest pain and palpitations.  Gastrointestinal: Positive for abdominal pain.  Musculoskeletal: Positive for myalgias and back pain.  Neurological: Positive for sensory change, speech change and focal weakness. Negative for headaches.      Past Medical History  Diagnosis Date  . Paroxysmal atrial flutter   . Palpitations     OCCASSIONAL  . PAC (premature atrial contraction)     ISOLATED  . PVC's (premature ventricular contractions)     ISOLATED  . Malaise   . Fatigue   . Myalgia     Past Surgical History  Procedure Laterality Date  . Vulvar lesion removal  01/01/2009    She had a 1-cm area of erythema to the right of the urethral meatus  . Thoracic laminectomy for epidural abscess N/A 11/16/2014    Procedure: THORACIC LAMINECTOMY FOR EPIDURAL ABSCESS; Surgeon: Floyce Stakes, MD; Location: [place] [facility]; Service: Neurosurgery; Laterality: N/A;    Family History  Problem Relation Age of Onset  . Hyperlipidemia Mother     Social History: Lives alone. Independent and active PTA. Reports she walks 3-4 times/daily. she reports that she quit smoking about 23 years ago. She does not have any smokeless tobacco history on file. She reports that she does not drink alcohol or use illicit drugs.    Allergies  Allergen Reactions  . Crestor [Rosuvastatin Calcium]    Medications Prior to Admission  Medication Sig Dispense Refill  . alendronate (FOSAMAX) 35 MG tablet Take 35 mg by mouth every 7 (seven) days. Take with  a full glass of water on an empty stomach. Sunday    . atorvastatin (LIPITOR) 10 MG tablet Take 10 mg by mouth daily.    . Calcium Carbonate-Vitamin D (CALCIUM + D PO) Take by mouth 2 (two) times daily.     . Coenzyme Q10 (COQ10 PO) Take  by mouth daily.    . metoprolol succinate (TOPROL XL) 25 MG 24 hr tablet Take 0.5 tablets (12.5 mg total) by mouth daily. 90 tablet 3  . Omega-3 Fatty Acids (FISH OIL PO) Take 2,000 mg by mouth 2 (two) times daily.     . predniSONE (DELTASONE) 20 MG tablet Take 2 tablets (40 mg total) by mouth daily. 10 tablet 0  . ramipril (ALTACE) 2.5 MG capsule Take 1 capsule (2.5 mg total) by mouth daily. 90 capsule 3  . warfarin (COUMADIN) 5 MG tablet Take as directed by coumadin clinic 90 tablet 2  . amoxicillin (AMOXIL) 500 MG capsule as needed. Prior to dental visit      Home: Home Living Family/patient expects to be discharged to:: Skilled nursing facility Living Arrangements: Alone  Functional History: Prior Function Level of Independence: Independent  Functional Status:  Mobility: Bed Mobility Overal bed mobility: Needs Assistance, +2 for physical assistance Bed Mobility: Rolling, Sidelying to Sit Rolling: Min assist Sidelying to sit: Max assist, +2 for physical assistance Supine to sit: Max assist Sit to supine: +2 for physical assistance, Max assist General bed mobility comments: cues for log roll technique and sequencing bed mobility.  Transfers Overall transfer level: Needs assistance Equipment used: 2 person hand held assist Transfers: Sit to/from Stand, Stand Pivot Transfers Sit to Stand: Max assist, +2 physical assistance Stand pivot transfers: Max assist, +2 physical assistance General transfer comment: cues for UE use and positioning of LEs. pt unable to achieve full standing and leans her head on therapy staff. pt incontinent of bowels in standing and required multiple stands for hygiene.  Ambulation/Gait Ambulation/Gait assistance: Mod assist, +2 safety/equipment Ambulation Distance (Feet): 50 Feet (10) Assistive device: Rolling walker (2 wheeled) Gait Pattern/deviations: Step-to pattern, Ataxic, Decreased weight shift to right,  Decreased weight shift to left Gait velocity: decreased General Gait Details: unable due to BP    ADL: ADL Overall ADL's : Needs assistance/impaired Grooming: Oral care, Wash/dry face, Brushing hair, Sitting, Set up Grooming Details (indicate cue type and reason): supported sitting Toilet Transfer: +2 for physical assistance, Moderate assistance Toilet Transfer Details (indicate cue type and reason): required ace wraps on bil LE and SCD applied to due orthostatic BP Toileting- Clothing Manipulation and Hygiene: Total assistance, Maximal assistance Toileting - Clothing Manipulation Details (indicate cue type and reason): pt without awareness of BM Functional mobility during ADLs: Moderate assistance, +2 for physical assistance, Rolling walker General ADL Comments: Pt total (A) for peri care due to incontinence of bowel / bladder. Pt with new onset of bowel/ bladder starting previous week and has incr. OT unable to obtain orthostatic BP due to incontinence. Pt reports pain limiting all mobility. pt unabel to tolerate Eob sitting due to pain without support. Sister present and very concerned with pending d/c options due to phone call from [facility] stating pending arrival to their facilty today. Sister does not want patient to d/c to [facility] and prefers inpatient rehab. CM Hassan Rowan arriving and discussing with sister POA Remo Lipps during end of session.  Cognition: Cognition Overall Cognitive Status: Impaired/Different from baseline Orientation Level: Oriented X4 Cognition Arousal/Alertness: Awake/alert Behavior During Therapy: WFL for tasks assessed/performed  Overall Cognitive Status: Impaired/Different from baseline Area of Impairment: Memory, Awareness, Problem solving Current Attention Level: Focused Memory: Decreased short-term memory Following Commands: Follows one step commands inconsistently Awareness: Emergent Problem Solving: Slow processing, Difficulty sequencing, Requires  verbal cues, Requires tactile cues General Comments: Pt reports OT helped patient to chair on sunday 11/25/14. Ot reports to patient no we havent worked together since last week. Pt states "oh it was this other girl then " and points to rehab tech present. Pt unable to report accurately to sister food that was eaten or events that occurred. Pt with incr confusion compared to previous session with OT. Pt also with lack of awareness to bowel and bladder uncontrolled  Physical Exam: Blood pressure 102/86, pulse 85, temperature 98.2 F (36.8 C), temperature source Oral, resp. rate 27, height $RemoveBe'5\' 6"'PYGwOUVml$  (1.676 m), weight 59.8 kg (131 lb 13.4 oz), SpO2 100 %.   Physical Exam Nursing note and vitals reviewed. Constitutional: She is oriented to person, place, and time. She appears well-developed. She appears cachectic.  HENT: oral mucosa moist, dentition good Head: Normocephalic and atraumatic.  Eyes: Conjunctivae are normal. Pupils are equal, round, and reactive to light.  Neck: Normal range of motion. Neck supple.  Cardiovascular: Normal rate.  Respiratory: Effort normal and breath sounds normal. No respiratory distress. She has no wheezes.  GI: Soft. Bowel sounds are normal. She exhibits no distension.  Mild fullness noted RLQ with some tenderness.  Musculoskeletal: She exhibits no edema or tenderness.  Neurological: She is alert and oriented to person, place, and time.  Speech clear. Follows commands without difficulty.  decreased proprioception/LT in both legs. Strength: 3/5HF,KE bilaterally and 4- ADF/APF. UE's 5/5.  Skin: Skin is warm and dry. Back incision is clean,dry and intact.    Lab Results Last 48 Hours    Results for orders placed or performed during the hospital encounter of 11/16/14 (from the past 48 hour(s))  MRSA PCR Screening Status: None   Collection Time: 11/26/14 10:43 PM  Result Value Ref Range   MRSA by PCR NEGATIVE NEGATIVE    Comment:    The GeneXpert MRSA Assay (FDA approved for NASAL specimens only), is one component of a comprehensive MRSA colonization surveillance program. It is not intended to diagnose MRSA infection nor to guide or monitor treatment for MRSA infections.   Culture, Urine Status: None (Preliminary result)   Collection Time: 11/26/14 10:51 PM  Result Value Ref Range   Specimen Description URINE, CATHETERIZED    Special Requests NONE    Colony Count      >=100,000 COLONIES/ML Performed at [facility] Performed at [facility]    Report Status PENDING   CBC Status: Abnormal   Collection Time: 11/27/14 10:54 AM  Result Value Ref Range   WBC 6.2 4.0 - 10.5 K/uL   RBC 3.64 (L) 3.87 - 5.11 MIL/uL   Hemoglobin 12.0 12.0 - 15.0 g/dL   HCT 35.3 (L) 36.0 - 46.0 %   MCV 97.0 78.0 - 100.0 fL   MCH 33.0 26.0 - 34.0 pg   MCHC 34.0 30.0 - 36.0 g/dL   RDW 11.7 11.5 - 15.5 %   Platelets 214 150 - 400 K/uL  Basic metabolic panel Status: Abnormal   Collection Time: 11/27/14 10:54 AM  Result Value Ref Range   Sodium 132 (L) 135 - 145 mmol/L    Comment: Please note change in reference range.  Potassium 3.5 3.5 - 5.1 mmol/L    Comment: Please note change in reference range.   Chloride 100 96 - 112 mEq/L   CO2 27 19 - 32 mmol/L   Glucose, Bld 114 (H) 70 - 99 mg/dL   BUN 7 6 - 23 mg/dL   Creatinine, Ser 0.48 (L) 0.50 - 1.10 mg/dL   Calcium 7.9 (L) 8.4 - 10.5 mg/dL   GFR calc non Af Amer 90 (L) >90 mL/min   GFR calc Af Amer >90 >90 mL/min    Comment: (NOTE) The eGFR has been calculated using the CKD EPI equation. This calculation has not been validated in all clinical situations. eGFR's persistently <90 mL/min signify possible Chronic Kidney Disease.    Anion gap 5 5 - 15      Imaging Results  (Last 48 hours)    Ct Abdomen Pelvis W Contrast  11/26/2014 CLINICAL DATA: Patient with history of recent back surgery. Right lower quadrant pain. EXAM: CT ABDOMEN AND PELVIS WITH CONTRAST TECHNIQUE: Multidetector CT imaging of the abdomen and pelvis was performed using the standard protocol following bolus administration of intravenous contrast. CONTRAST: 135m OMNIPAQUE IOHEXOL 300 MG/ML SOLN COMPARISON: 11/16/2014 FINDINGS: Lower chest: Small bilateral pleural effusions. Normal heart size. Consolidative opacities within the bilateral lower lobes, suggestive of atelectasis. Additionally there is a central filling defect within a right lower lobe pulmonary artery (image 4; series 201), compatible with pulmonary embolism. Hepatobiliary: The liver is normal in size and contour without focal hepatic lesion identified. Mild central intrahepatic biliary ductal dilatation. The common bile duct is appropriate for patient's age measuring up to 8 mm. Gallbladder is unremarkable. Pancreas: The pancreatic duct measures up to 3 mm. The pancreatic parenchyma is grossly unremarkable. Spleen: Unremarkable Adrenals/Urinary Tract: Normal adrenal glands. Kidneys enhance symmetrically with contrast. Exophytic simple cyst off the inferior pole of the left kidney. No hydronephrosis. Urinary bladder is markedly distended. Stomach/Bowel: Stool present throughout the colon as can be seen with constipation. Sigmoid colonic diverticulosis without evidence for acute diverticulitis. Normal appendix. No evidence for small bowel obstruction. Mild gaseous distended loops of small bowel, potentially representing postoperative ileus. Vascular/Lymphatic: Scattered calcified atherosclerotic plaque involving the abdominal aorta. No retroperitoneal lymphadenopathy. Other: Perihepatic ascites. No free intraperitoneal air. Anasarca. Musculoskeletal: Postoperative changes involving the posterior elements of the T11, T12 and L1  vertebral bodies. No significant residual gas demonstrated within the spinal canal at these levels. There is fluid demonstrated within the overlying soft tissues. There is an overlying surgical staple line. Suggestion of interval decrease in previously described high attenuation acute blood products within the spinal canal. IMPRESSION: 1. Findings compatible with acute pulmonary embolism within the right lower lobe pulmonary artery. 2. Postoperative changes from the T11 through L1 vertebral bodies. There is associated fluid within the overlying soft tissues, likely postoperative in etiology. Superimposed infection not excluded. 3. Anasarca and small amount of ascites. 4. Marked urinary bladder dilatation. Consider decompression with Foley catheter. 5. Stool throughout the colon as can be seen with constipation. Possible postoperative ileus. 6. Prominent pancreatic duct measuring up to 3 mm. Mild central intrahepatic biliary ductal dilatation. Recommend correlation with laboratory analysis. Critical Value/emergent results were called by telephone at the time of interpretation on 11/26/2014 at 7:59 pm to Dr. PTrenton Gammon who verbally acknowledged these results. Electronically Signed By: DLovey NewcomerM.D. On: 11/26/2014 20:01   Ir Ivc Filter Plmt / S&i /img Guid/mod Sed  11/27/2014 INDICATION: 80year old female previously supratherapeutic on anticoagulation (History of paroxysmal atrial fibrillation) complicated by  subdural spinal hematoma and lower extremity weakness requiring emergent surgical decompression. She has since been doing well until developing right upper quadrant pain yesterday. A CT scan of the abdomen and pelvis demonstrates new acute pulmonary embolus in the right lower lobe pulmonary arteries. The patient is currently not a candidate for anticoagulation given her recent spinal decompression surgery on 11/16/2014. Caval interruption is warranted for PE prophylaxis. EXAM: ULTRASOUND GUIDANCE FOR  VASCULARACCESS IVC CATHETERIZATION AND VENOGRAM IVC FILTER INSERTION COMPARISON: CT abdomen/pelvis 11/26/2014 MEDICATIONS: Fentanyl 25 mcg IV; Versed 1 mg IV ANESTHESIA/SEDATION: Sedation Time Thirteen minutes CONTRAST: 47m OMNIPAQUE IOHEXOL 300 MG/ML SOLN FLUOROSCOPY TIME: 1 minutes 36 seconds 1116.5mGy COMPLICATIONS: None immediate PROCEDURE: Informed consent was obtained from the patient following explanation of the procedure, risks, benefits and alternatives. The patient understands, agrees and consents for the procedure. All questions were addressed. A time out was performed prior to the initiation of the procedure. Maximal barrier sterile technique utilized including caps, mask, sterile gowns, sterile gloves, large sterile drape, hand hygiene, and Betadine prep. Under sterile condition and local anesthesia, right internal jugular venous access was performed with ultrasound. An ultrasound image was saved and sent to PACS. Over a guidewire, the IVC filter delivery sheath and inner dilator were advanced into the IVC just above the IVC bifurcation. Contrast injection was performed for an IVC venogram. Through the delivery sheath, a retrievable Denali IVC filter was deployed below the level of the renal veins and above the IVC bifurcation. Limited post deployment venacavagram was performed. The delivery sheath was removed and hemostasis was obtained with manual compression. A dressing was placed. The patient tolerated the procedure well without immediate post procedural complication. FINDINGS: The IVC is patent. No evidence of thrombus, stenosis, or occlusion. No variant venous anatomy. Successful placement of the IVC filter below the level of the renal veins. IMPRESSION: Successful ultrasound and fluoroscopically guided placement of an infrarenal retrievable IVC filter via right jugular approach. PLAN: This IVC filter is potentially retrievable. The patient will be assessed for filter  retrieval by Interventional Radiology in approximately 8-12 weeks. Further recommendations regarding filter retrieval, continued surveillance or declaration of device permanence, will be made at that time. Signed, HCriselda Peaches MD [facility] Electronically Signed By: HJacqulynn CadetM.D. On: 11/27/2014 17:33        Medical Problem List and Plan: 1. Functional deficits secondary to T 10-12 hematoma with myelopathy and paraplegia 2. PE/BLE DVT /Anticoagulation: Mechanical: Sequential compression devices, below knee Bilateral lower extremities No blood thinner due to thoracic hematoma--verified with Dr. PAnnette Stable 3. Pain Management: Continue oxycodone prn.  4. Mood: LCSW to follow for evaluation and support.  5. Neuropsych: This patient is not quite capable of making decisions on her own behalf. 6. Skin/Wound Care:  7. Fluids/Electrolytes/Nutrition: Monitor I/O. D/c IVF. Intake improving with family bring in food. Will add protein supplement to help with nutritional stores. Offer supplement additionally prn poor intake. Check follow up labs in am  8. E coli UTI: Will start suprax for treatment.  9. Mild ileus: Check follow up KUB due to ongoing diarrhea/incontinence. SSE for constipation(stool thorough out the colon). 10. Urinary retention: Continue foley for for now and start voiding trial once bowels normalized.  11. Hyponatremia: Slowly improving. Continue NaCl tabs Recheck in am.  12. Hypotension: Will continue florinef. Change flomax to bedtime to avoid drop during the day. Add abdominal binder and TEDs to help with BP support.  13. Afib: ON ASA  for stroke prevention. 14. Hypokalemia: Likely dilutional due to ongoing IVF. Will add supplement. Check labs in am.     Post Admission Physician Evaluation: 1. Functional deficits secondary to T 10-12 hematoma with myelopathy and incomplete paraplegia.   2. Patient is admitted to receive collaborative, interdisciplinary care between the physiatrist, rehab nursing staff, and therapy team. 3. Patient's level of medical complexity and substantial therapy needs in context of that medical necessity cannot be provided at a lesser intensity of care such as a SNF. 4. Patient has experienced substantial functional loss from his/her baseline which was documented above under the "Functional History" and "Functional Status" headings. Judging by the patient's diagnosis, physical exam, and functional history, the patient has potential for functional progress which will result in measurable gains while on inpatient rehab. These gains will be of substantial and practical use upon discharge in facilitating mobility and self-care at the household level. 5. Physiatrist will provide 24 hour management of medical needs as well as oversight of the therapy plan/treatment and provide guidance as appropriate regarding the interaction of the two. 6. 24 hour rehab nursing will assist with bladder management, bowel management, safety, skin/wound care, disease management, medication administration, pain management and patient education and help integrate therapy concepts, techniques,education, etc. 7. PT will assess and treat for/with: Lower extremity strength, range of motion, stamina, balance, functional mobility, safety, adaptive techniques and equipment, NMR, SCI education, ego support, pain control. Goals are: mod I to supervision. 8. OT will assess and treat for/with: ADL's, functional mobility, safety, upper extremity strength, adaptive techniques and equipment, NMR, SCI education, pain control, ego support, leisure awareness. Goals are: supervision to mod I. Therapy may not yet proceed with showering this patient. 9. SLP will assess and treat for/with: n/a. Goals are: n/a. 10. Case Management and Social Worker will assess and treat for psychological issues and  discharge planning. 11. Team conference will be held weekly to assess progress toward goals and to determine barriers to discharge. 12. Patient will receive at least 3 hours of therapy per day at least 5 days per week. 13. ELOS: 18-24 days  14. Prognosis: excellent     Meredith Staggers, MD, [place] [facility] 11/29/2014

## 2014-11-29 NOTE — Progress Notes (Signed)
Pt transferred to Rehab from [facility]M04. Alert and orientated x 4. Orientated to rehab's expectation, therapy schedule, etc. Pt looking forward to therapy in the morning.

## 2014-11-29 NOTE — Progress Notes (Signed)
Chaplain visited with pt and family per nurse referral. Chaplain introduced herself and her services to pt and pt son, [name]. Chaplain offered prayer and emotional support. Chaplain will continue to follow to CIR.   11/29/14 1100  Clinical Encounter Type  Visited With Patient and family together  Visit Type Initial;Spiritual support  Referral From Nurse  Recommendations Follow Up  Spiritual Encounters  Spiritual Needs Emotional;Prayer  Stress Factors  Patient Stress Factors Major life changes  Family Stress Factors Major life changes  Raymon Schlarb, Mayer MaskerCourtney F, Chaplain 11/29/2014 11:21 AM

## 2014-11-29 NOTE — Discharge Summary (Signed)
Physician Discharge Summary  Patient ID: [name] Weber MRN: [id number] DOB/AGE: 78/12/1930 78 y.o.  Admit date: 11/16/2014 Discharge date: 11/29/2014  Admission Diagnoses:thoracic subdural hematoma. paraplegia  Discharge Diagnoses:  Active Problems:   Paroxysmal atrial flutter   Hypercholesterolemia   Paraplegia at T9 level   Weakness of both legs   Near syncope   Orthostatic hypotension   Traumatic spinal subdural hematoma   Acute pulmonary embolism   Persistent atrial fibrillation   Discharged Condition:stable  Hospital Course: emergency surgery  Consults:cardiology, general surgery, rehabilitation medicine. ir  Significant Diagnostic Studies:mri thoracic-lumbar spine. Ct abdomen, chest  Treatments:removal thoracic spine hematoma. IVC for PE  Discharge Exam: Blood pressure 114/60, pulse 89, temperature 98.1 F (36.7 C), temperature source Oral, resp. rate 15, height 5\' 6"  (1.676 m), weight 59.8 kg (131 lb 13.4 oz), SpO2 96 %. Improving from his paraplegia. No sob  [facility]     Medication List    TAKE these medications        alendronate 35 MG tablet  Commonly known as:  FOSAMAX  Take 35 mg by mouth every 7 (seven) days. Take with a full glass of water on an empty stomach. Sunday     amoxicillin 500 MG capsule  Commonly known as:  AMOXIL  as needed. Prior to dental visit     atorvastatin 10 MG tablet  Commonly known as:  LIPITOR  Take 10 mg by mouth daily.     CALCIUM + D PO  Take by mouth 2 (two) times daily.     COQ10 PO  Take by mouth daily.     FISH OIL PO  Take 2,000 mg by mouth 2 (two) times daily.     HYDROcodone-acetaminophen 5-325 MG per tablet  Commonly known as:  NORCO/VICODIN  Take 1 tablet by mouth every 4 (four) hours as needed for moderate pain.     metoprolol succinate 25 MG 24 hr tablet  Commonly known as:  TOPROL XL  Take 0.5 tablets (12.5 mg total) by mouth daily.     ondansetron 8 MG  disintegrating tablet  Commonly known as:  ZOFRAN ODT  Take 1 tablet (8 mg total) by mouth every 8 (eight) hours as needed for nausea or vomiting.     predniSONE 20 MG tablet  Commonly known as:  DELTASONE  Take 2 tablets (40 mg total) by mouth daily.     ramipril 2.5 MG capsule  Commonly known as:  ALTACE  Take 1 capsule (2.5 mg total) by mouth daily.     warfarin 5 MG tablet  Commonly known as:  COUMADIN  Take as directed by coumadin clinic           Follow-up Information    Please follow up.   Why:  Please call your doctor for follow up in 2-5 days      [name]   Specialty:  Emergency Medicine   Why:  Return to ER for any new or worsening symptoms   Contact information:   [address] [id number] mc [place] [id number] [phone]      Signed: Karn CassisBOTERO,Kyrie Fludd M 11/29/2014, 1:34 PM

## 2014-11-30 ENCOUNTER — Inpatient Hospital Stay (HOSPITAL_COMMUNITY): Payer: Medicare Other | Admitting: *Deleted

## 2014-11-30 ENCOUNTER — Inpatient Hospital Stay (HOSPITAL_COMMUNITY): Payer: Medicare Other | Admitting: Occupational Therapy

## 2014-11-30 DIAGNOSIS — B9620 Unspecified Escherichia coli [E. coli] as the cause of diseases classified elsewhere: Secondary | ICD-10-CM

## 2014-11-30 DIAGNOSIS — K592 Neurogenic bowel, not elsewhere classified: Secondary | ICD-10-CM

## 2014-11-30 DIAGNOSIS — N319 Neuromuscular dysfunction of bladder, unspecified: Secondary | ICD-10-CM

## 2014-11-30 DIAGNOSIS — A499 Bacterial infection, unspecified: Secondary | ICD-10-CM | POA: Insufficient documentation

## 2014-11-30 DIAGNOSIS — N390 Urinary tract infection, site not specified: Secondary | ICD-10-CM

## 2014-11-30 DIAGNOSIS — G839 Paralytic syndrome, unspecified: Secondary | ICD-10-CM

## 2014-11-30 DIAGNOSIS — M4714 Other spondylosis with myelopathy, thoracic region: Secondary | ICD-10-CM

## 2014-11-30 HISTORY — DX: Neurogenic bowel, not elsewhere classified: K59.2

## 2014-11-30 HISTORY — DX: Neuromuscular dysfunction of bladder, unspecified: N31.9

## 2014-11-30 HISTORY — DX: Urinary tract infection, site not specified: A49.9

## 2014-11-30 HISTORY — DX: Bacterial infection, unspecified: N39.0

## 2014-11-30 LAB — CBC WITH DIFFERENTIAL/PLATELET
Basophils Absolute: 0 10*3/uL (ref 0.0–0.1)
Basophils Relative: 0 % (ref 0–1)
Eosinophils Absolute: 0.1 10*3/uL (ref 0.0–0.7)
Eosinophils Relative: 2 % (ref 0–5)
HCT: 34.4 % — ABNORMAL LOW (ref 36.0–46.0)
Hemoglobin: 11.6 g/dL — ABNORMAL LOW (ref 12.0–15.0)
Lymphocytes Relative: 18 % (ref 12–46)
Lymphs Abs: 1 10*3/uL (ref 0.7–4.0)
MCH: 33 pg (ref 26.0–34.0)
MCHC: 33.7 g/dL (ref 30.0–36.0)
MCV: 98 fL (ref 78.0–100.0)
Monocytes Absolute: 0.6 10*3/uL (ref 0.1–1.0)
Monocytes Relative: 10 % (ref 3–12)
Neutro Abs: 3.8 10*3/uL (ref 1.7–7.7)
Neutrophils Relative %: 70 % (ref 43–77)
Platelets: 220 10*3/uL (ref 150–400)
RBC: 3.51 MIL/uL — ABNORMAL LOW (ref 3.87–5.11)
RDW: 12.1 % (ref 11.5–15.5)
WBC: 5.5 10*3/uL (ref 4.0–10.5)

## 2014-11-30 LAB — COMPREHENSIVE METABOLIC PANEL
ALT: 17 U/L (ref 0–35)
AST: 25 U/L (ref 0–37)
Albumin: 2.2 g/dL — ABNORMAL LOW (ref 3.5–5.2)
Alkaline Phosphatase: 66 U/L (ref 39–117)
Anion gap: 4 — ABNORMAL LOW (ref 5–15)
BUN: <5 mg/dL — ABNORMAL LOW (ref 6–23)
CO2: 31 mmol/L (ref 19–32)
Calcium: 8.3 mg/dL — ABNORMAL LOW (ref 8.4–10.5)
Chloride: 99 mEq/L (ref 96–112)
Creatinine, Ser: 0.55 mg/dL (ref 0.50–1.10)
GFR calc Af Amer: >90 mL/min (ref >90)
GFR calc non Af Amer: 86 mL/min — ABNORMAL LOW (ref >90)
Glucose, Bld: 106 mg/dL — ABNORMAL HIGH (ref 70–99)
Potassium: 3.9 mmol/L (ref 3.5–5.1)
Sodium: 134 mmol/L — ABNORMAL LOW (ref 135–145)
Total Bilirubin: 0.6 mg/dL (ref 0.3–1.2)
Total Protein: 5 g/dL — ABNORMAL LOW (ref 6.0–8.3)

## 2014-11-30 MED ORDER — SORBITOL 70 % SOLN
960.0000 mL | TOPICAL_OIL | Freq: Once | ORAL | Status: AC
  Administered 2014-11-30: 960 mL via RECTAL
  Filled 2014-11-30: qty 240

## 2014-11-30 NOTE — Progress Notes (Signed)
PHYSICAL MEDICINE & REHABILITATION     PROGRESS NOTE    Subjective/Complaints: Feeling very ill due to ongoing constipation. A lot of anxiety over it as well. Had difficulty resting last night A  review of systems has been performed and if not noted above is otherwise negative.   Objective: Vital Signs: Blood pressure 128/70, pulse 69, temperature 98.4 F (36.9 C), temperature source Oral, resp. rate 16, SpO2 98 %. Dg Abd 1 View  11/29/2014   CLINICAL DATA:  Follow-up ileus or constipation.  EXAM: ABDOMEN - 1 VIEW  COMPARISON:  November 26, 2014.  FINDINGS: IVC filter is noted. Residual contrast is noted throughout the colon. There is no evidence of bowel dilatation. Moderate residual stool is noted.  IMPRESSION: Moderate stool burden is noted suggesting constipation.   Electronically Signed   By: Roque Lias M.D.   On: 11/29/2014 20:19    Recent Labs  11/27/14 1054  WBC 6.2  HGB 12.0  HCT 35.3*  PLT 214    Recent Labs  11/27/14 1054  NA 132*  K 3.5  CL 100  GLUCOSE 114*  BUN 7  CREATININE 0.48*  CALCIUM 7.9*   CBG (last 3)  No results for input(s): GLUCAP in the last 72 hours.  Wt Readings from Last 3 Encounters:  11/17/14 59.8 kg (131 lb 13.4 oz)  11/13/14 48.535 kg (107 lb)  07/16/14 48.988 kg (108 lb)    Physical Exam:  Constitutional: She is oriented to person, place, and time. She appears well-developed. She appears cachectic.  HENT: oral mucosa moist, dentition good Head: Normocephalic and atraumatic.  Eyes: Conjunctivae are normal. Pupils are equal, round, and reactive to light.  Neck: Normal range of motion. Neck supple.  Cardiovascular: Normal rate.  Respiratory: Effort normal and breath sounds normal. No respiratory distress. She has no wheezes.  GI: Soft. Bowel sounds are normal. She exhibits no distension.  Mild fullness noted RLQ with some tenderness.  Musculoskeletal: She exhibits no edema or tenderness.  Neurological:  She is alert and oriented to person, place, and time.  Speech clear. Follows commands without difficulty. decreased proprioception/LT in both legs. Strength: 3/5HF,KE bilaterally and 4- ADF/APF. UE's 5/5.  Skin: Skin is warm and dry. Back incision is clean,dry and intact.   Assessment/Plan: 1. Functional deficits secondary to T10-12 hematoma with subsequent myelopathy and paraplegia which require 3+ hours per day of interdisciplinary therapy in a comprehensive inpatient rehab setting. Physiatrist is providing close team supervision and 24 hour management of active medical problems listed below. Physiatrist and rehab team continue to assess barriers to discharge/monitor patient progress toward functional and medical goals. FIM:                   Comprehension Comprehension Mode: Auditory Comprehension: 5-Follows basic conversation/direction: With extra time/assistive device  Expression Expression Mode: Verbal Expression: 5-Expresses complex 90% of the time/cues < 10% of the time  Social Interaction Social Interaction: 5-Interacts appropriately 90% of the time - Needs monitoring or encouragement for participation or interaction.  Problem Solving Problem Solving: 5-Solves basic problems: With no assist  Memory Memory: 7-Complete Independence: No helper  Medical Problem List and Plan: 1. Functional deficits secondary to T 10-12 hematoma with myelopathy and paraplegia 2. PE/BLE DVT /Anticoagulation: Mechanical: Sequential compression devices, below knee Bilateral lower extremities No blood thinner due to thoracic hematoma--verified with Dr. Jordan Likes  3. Pain Management: Continue oxycodone prn.  4. Mood: LCSW to follow for evaluation and support.  5. Neuropsych:  This patient is not quite capable of making decisions on her own behalf. 6. Skin/Wound Care:  7. Fluids/Electrolytes/Nutrition: Monitor I/O. D/c IVF. Intake improving with family bring in food.     -supps  -check labs 8. E coli UTI: Will start suprax for treatment x 7 days  9. Neurogenic bowel: constipation on KUB. SMOG enema today  -work on regular regimen also 10. Neurogenic bladder: Continue foley for for now and start voiding trial once bowels normalized.  11. Hyponatremia: Slowly improving. Continue NaCl tabs Recheck in am.  12. Hypotension: Will continue florinef. Change flomax to bedtime to avoid drop during the day. Add abdominal binder and TEDs to help with BP support.  13. Afib: ON ASA for stroke prevention. 14. Hypokalemia:  Follow up labs, supplement   LOS (Days) 1 A FACE TO FACE EVALUATION WAS PERFORMED  SWARTZ,ZACHARY T 11/30/2014 7:54 AM

## 2014-11-30 NOTE — Plan of Care (Signed)
Problem: RH BOWEL ELIMINATION Goal: RH STG MANAGE BOWEL WITH ASSISTANCE STG Manage Bowel with Assistance. Mod I  Outcome: Not Progressing Unsuccessful soap suds enema     Problem: RH BLADDER ELIMINATION Goal: RH STG MANAGE BLADDER WITH EQUIPMENT WITH ASSISTANCE STG Manage Bladder With Equipment With Assistance. Mod I  Outcome: Not Progressing Foley catheter in place

## 2014-11-30 NOTE — Progress Notes (Signed)
Patient information reviewed and entered into eRehab system by Vertie Dibbern, RN, CRRN, PPS Coordinator.  Information including medical coding and functional independence measure will be reviewed and updated through discharge.     Per nursing patient was given "Data Collection Information Summary for Patients in Inpatient Rehabilitation Facilities with attached "Privacy Act Statement-Health Care Records" upon admission.  

## 2014-11-30 NOTE — Evaluation (Signed)
Physical Therapy Assessment and Plan  Patient Details  Name: [name] Weber MRN: [id number] Date of Birth: [birth year]  PT Diagnosis: Abnormal posture, Difficulty walking, Impaired sensation, Muscle weakness and Paraplegia Rehab Potential: Good ELOS: 21-230 days   Today's Date: 11/30/2014 PT Individual Time:9:50-10:50 and  1300-1400 PT Individual Time Calculation (min): 60 min  And 30mn  Problem List:  Patient Active Problem List   Diagnosis Date Noted  . Neurogenic bladder 11/30/2014  . Neurogenic bowel 11/30/2014  . E. coli UTI 11/30/2014  . Thoracic myelopathy 11/29/2014  . Persistent atrial fibrillation 11/27/2014  . Traumatic spinal subdural hematoma   . Acute pulmonary embolism   . Orthostatic hypotension 11/22/2014  . Near syncope 11/21/2014  . Paraplegia at T9 level 11/17/2014  . Weakness of both legs   . Nocturnal leg cramps 07/16/2014  . Encounter for therapeutic drug monitoring 12/25/2013  . Malaise and fatigue 06/01/2011  . Atrial flutter 03/10/2011  . Encounter for long-term (current) use of anticoagulants 03/10/2011  . Benign hypertensive heart disease without heart failure 03/10/2011  . Hypercholesterolemia 03/10/2011  . Paroxysmal atrial flutter   . Palpitations   . PAC (premature atrial contraction)   . PVC's (premature ventricular contractions)   . Malaise   . Fatigue   . Myalgia     Past Medical History:  Past Medical History  Diagnosis Date  . Paroxysmal atrial flutter   . Palpitations     OCCASSIONAL  . PAC (premature atrial contraction)     ISOLATED  . PVC's (premature ventricular contractions)     ISOLATED  . Malaise   . Fatigue   . Myalgia    Past Surgical History:  Past Surgical History  Procedure Laterality Date  . Vulvar lesion removal  01/01/2009    She had a 1-cm area of erythema to the right of the urethral meatus  . Thoracic laminectomy for epidural abscess N/A 11/16/2014    Procedure: THORACIC LAMINECTOMY FOR EPIDURAL  ABSCESS;  Surgeon: EFloyce Stakes MD;  [facility];  Service: Neurosurgery;  Laterality: N/A;    Assessment & Plan Clinical Impression: [name] MINKLERis a 81y.o. female with a history of PAF on coumadin, HLD and no other real significant medical history who presented to 11/16/14 with new low back pain with weakness and inability to walk. MRI of spine with abnormal cord signal due to hemorrhage/hematoma in the ventral spinal canal T 10- T 12 causing spinal chord compression. Her coumadin was emergently reversed and patient underwent thoracic laminectomy T 11 -T 12 decompression of epidural space and evacuation of SDH by Dr. BJoya Salmthe same day. Post op with A flutter and cardiology assisting with rate control and BP management. he had transient improvement in BLE weakness and follow up CT lumbar/thoracis spine reveals post op changes with with hyperdense blood products in lower lumbar spine. She developed hypotension requiring florinef for BP support and cardizem changed to digoxin to help with A flutter. She continues to have orthostatic changes affecting mobility and IVF was added for support. CCS consulted for input on ongoing RLQ/abdominal pain and CT abdomen/pelvis ordered for workup and revealed RLL PE as well as prominent pancreatic duct, markedly distension bladder and stool throughout the colon with possible ileus. Patient was transferred to ICU for closer monitoring and BLE dopplers with mobile DVT in CFV and in gastrocnemius as well as L-DVT in soleus vein. IVC filter placed by radiology on 12/29 and patient cleared to start ASA for stroke  prevention per Dr. Joya Salm.  Patient with resultant paraparesis as well as bowel/bladder incontinence. MD, family and rehab team requesting CIR for progressive therapy. Family now indicating ability to provide 24/7 care past discharge.  Patient transferred to CIR on 11/29/2014 .   Patient currently requires max with mobility secondary to  muscle weakness, unbalanced muscle activation and decreased sitting balance, decreased standing balance, decreased postural control, decreased balance strategies and difficulty maintaining precautions.  Prior to hospitalization, patient was independent  with mobility and lived with Alone in a [place] home.  Home access is 14Stairs to enter.  Patient will benefit from skilled PT intervention to maximize safe functional mobility, minimize fall risk and decrease caregiver burden for planned discharge home with 24 hour assist.  Anticipate patient will benefit from follow up [facility] at discharge.  PT - End of Session Activity Tolerance: Tolerates < 10 min activity, no significant change in vital signs Endurance Deficit: Yes Endurance Deficit Description: Pt needs frequent rest breaks following 1-2 min activity PT Assessment Rehab Potential (ACUTE/IP ONLY): Good Barriers to Discharge: Decreased caregiver support;Inaccessible home environment PT Patient demonstrates impairments in the following area(s): Balance;Endurance;Motor;Pain;Safety;Sensory;Skin Integrity PT Transfers Functional Problem(s): Bed Mobility;Bed to Chair;Car;Furniture PT Locomotion Functional Problem(s): Ambulation;Wheelchair Mobility;Stairs PT Plan PT Intensity: Minimum of 1-2 x/day ,45 to 90 minutes PT Frequency: 5 out of 7 days PT Duration Estimated Length of Stay: 21-230 days PT Treatment/Interventions: Training and development officer;Ambulation/gait training;Cognitive remediation/compensation;Community reintegration;Discharge planning;Disease management/prevention;DME/adaptive equipment instruction;Functional mobility training;Neuromuscular re-education;Pain management;Patient/family education;Psychosocial support;Skin care/wound management;Splinting/orthotics;Stair training;Therapeutic Activities;UE/LE Strength taining/ROM;Therapeutic Exercise;UE/LE Coordination activities;Wheelchair propulsion/positioning PT Transfers Anticipated  Outcome(s): Supervision PT Locomotion Anticipated Outcome(s): Min A  PT Recommendation Recommendations for Other Services: Neuropsych consult Follow Up Recommendations: Home health PT;24 hour supervision/assistance Patient destination: Home (Lofty goal due to flight of stairs) Equipment Recommended: Rolling walker with 5" wheels;Wheelchair (measurements);Wheelchair cushion (measurements);To be determined Equipment Details: WC and/or RW TBD based on progress  Skilled Therapeutic Intervention  First tx focused on orientation to PT POC, goals, and safety. Pt perforemd seated therex including unsupported sitting x24mn, marching, LAQ, and ankle pumps 2x10 bil with cues for technique. Static standing at sink x28m with MaxA to prevent bil nkee bucking. Pt's knees did buckle forward duing sit, but +2 able to safely assist to chair.   Second tx focused on functional mobility training, WC and therex for strengthening. Pt needing to be cleaned due to continued bowel incontinence. Performed supine<>sit with Mod A x2 and rolling R/L x2 with cues for technique and back precautions. Instructed pt in sliding board transfer after her report of difficulty transfer with staff this afternoon. Pt still needed Max A with this technique, but likely more safe due to tendency of knees buckling. Pt continues to demonstrate difficulty hinging forwards at hips over BOS, tending to lean backwards in static and dynamic sitting. Pt propelled WC x150' with S and assist for parts. Pt [facility] with cues for maintaining hips in neutral position as adductors tend to overpower abductors. +2 assist not available to assist with stairs this afternoon. PT instructed NT in sliding board technique WC<>bed and pt encouraged to stay OOB for the afternoon.    PT Evaluation Precautions/Restrictions Precautions Precautions: Fall;Back Precaution Booklet Issued: No Precaution Comments: watch  orthostatic BP Restrictions Weight Bearing Restrictions: No General   Vital Signs  Pain Pain Assessment Pain Assessment: 0-10 Pain Score: 4  Pain Type: Acute pain Pain Location: Back Pain Orientation: Lower Pain Descriptors / Indicators: Aching  Pain Onset: With Activity Home Living/Prior Functioning Home Living Available Help at Discharge: Family Type of Home: Apartment Home Access: Stairs to enter Entrance Stairs-Number of Steps: 14 Home Layout: One level  Lives With: Alone Prior Function Level of Independence: Independent with basic ADLs;Independent with homemaking with ambulation;Independent with transfers  Able to Take Stairs?: Yes Driving: Yes 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 Leisure: Hobbies-yes (Comment) Comments: exercises daily Vision/Perception  Vision - History Baseline Vision: Wears glasses only for reading Visual History: Cataracts Patient Visual Report: No change from baseline Vision - Assessment Eye Alignment: Within Functional Limits Perception Perception: Within Functional Limits  Cognition Overall Cognitive Status: Impaired/Different from baseline Arousal/Alertness: Awake/alert Orientation Level: Oriented X4 Awareness: Impaired Awareness Impairment: Anticipatory impairment Problem Solving: Impaired Problem Solving Impairment: Functional complex Safety/Judgment: Appears intact Sensation Sensation Light Touch: Impaired Detail Light Touch Impaired Details: Impaired RLE;Impaired LLE Stereognosis: Appears Intact Hot/Cold: Appears Intact Proprioception: Appears Intact Additional Comments: Pt has difficutly localizing light touch, but can identify present Coordination Gross Motor Movements are Fluid and Coordinated: No Fine Motor Movements are Fluid and Coordinated: No Coordination and Movement Description: LE coordination limited due to weakness - adductor overpowers abductors Heel Shin Test: decreased excursion  and accuracy due to weakness Motor  Motor Motor: Paraplegia Motor - Skilled Clinical Observations: decreased strength in lower torso and LEs  Mobility Bed Mobility Bed Mobility: Supine to Sit;Sit to Supine Supine to Sit: 3: Mod assist;With rails;HOB flat Sit to Supine: 3: Mod assist Sit to Supine - Details (indicate cue type and reason): Pt needs safety cues for technique and Mod A for LE management Transfers Transfers: Yes Stand Pivot Transfers: 2: Max assist Stand Pivot Transfer Details (indicate cue type and reason): Pt required Max A to prevent bil knee buckling in standing at RW during turn Locomotion  Ambulation Ambulation: Yes Ambulation/Gait Assistance: 1: +2 Total assist Ambulation Distance (Feet): 20 Feet Assistive device: Rolling walker Stairs / Additional Locomotion Stairs: No (Unsafe to assess at this time) Product manager Mobility: Yes Wheelchair Assistance: 5: Careers information officer: Both upper extremities Wheelchair Parts Management: Needs assistance Distance: 150  Trunk/Postural Assessment  Cervical Assessment Cervical Assessment: Within Functional Limits Thoracic Assessment Thoracic Assessment:  (Incisions and decreased mobility, especially flexion) Lumbar Assessment Lumbar Assessment: Exceptions to [facility] (Decreased pelvic mobiltiy) Postural Control Postural Control: Deficits on evaluation Trunk Control: Pt needs up to Max A during sitting balance EOB due to posterior lean Righting Reactions: Delayed and insufficient Protective Responses: Reaches out with UEs but unable to effectively catch self. Needs up to Max A for trunk support Postural Limitations: Decreased postural control due to pain, weakness, and precautions  Balance Balance Balance Assessed: Yes Static Sitting Balance Static Sitting - Balance Support: Bilateral upper extremity supported;Feet supported Static Sitting - Level of Assistance: 4: Min assist Dynamic  Sitting Balance Dynamic Sitting - Balance Support: Bilateral upper extremity supported;Feet supported Dynamic Sitting - Level of Assistance: 2: Max assist Static Standing Balance Static Standing - Balance Support: During functional activity;Bilateral upper extremity supported Static Standing - Level of Assistance: 1: +2 Total assist Static Standing - Comment/# of Minutes: 24mn standing at sink for cleansing following lowel incontinence Dynamic Standing Balance Dynamic Standing - Level of Assistance: 1: +2 Total assist Dynamic Standing - Balance Activities: Lateral lean/weight shifting;Forward lean/weight shifting Extremity Assessment  RUE Assessment RUE Assessment: Within Functional Limits LUE Assessment LUE Assessment: Within Functional Limits RLE Assessment RLE Assessment: Exceptions to [facility] Strength RLE Overall Strength Comments: 3+/5 throughout  except, 3/5 hip abductors, 4/5 hip adductors LLE Assessment LLE Assessment: Exceptions to [facility] LLE Strength LLE Overall Strength Comments: 3+/5 throughout except, 3/5 hip abductors, 4/5 hip adductors  FIM:  FIM - Bed/Chair Transfer Bed/Chair Transfer Assistive Devices: Sliding board Bed/Chair Transfer: 3: Supine > Sit: Mod A (lifting assist/Pt. 50-74%/lift 2 legs;3: Sit > Supine: Mod A (lifting assist/Pt. 50-74%/lift 2 legs);2: Bed > Chair or W/C: Max A (lift and lower assist);2: Chair or W/C > Bed: Max A (lift and lower assist) FIM - Locomotion: Wheelchair Distance: 150 Locomotion: Wheelchair: 5: Travels 150 ft or more: maneuvers on rugs and over door sills with supervision, cueing or coaxing FIM - Locomotion: Ambulation Locomotion: Ambulation Assistive Devices: Administrator Ambulation/Gait Assistance: 1: +2 Total assist Locomotion: Ambulation: 1: Two helpers FIM - Locomotion: Stairs Locomotion: Stairs: 0: Activity did not occur   Refer to Care Plan for Long Term Goals  Recommendations for other services:  Neuropsych  Discharge Criteria: Patient will be discharged from PT if patient refuses treatment 3 consecutive times without medical reason, if treatment goals not met, if there is a change in medical status, if patient makes no progress towards goals or if patient is discharged from hospital.  The above assessment, treatment plan, treatment alternatives and goals were discussed and mutually agreed upon: by patient  Kennieth Rad, PT, DPT   Dwaine Deter, [place] 11/30/2014, 4:08 PM

## 2014-11-30 NOTE — Progress Notes (Signed)
Chaplain initiated follow up with pt. Pt appreciative of chaplain presence. Pt hopes to remain at inpatient rehab as she trusts the therapists here. Pt was also visited by pastor. Chaplain expects follow up early next week. Page chaplain if needed before follow up visit.  Kodah Maret, Mayer Masker, Chaplain 11/30/2014 3:37 PM

## 2014-11-30 NOTE — Evaluation (Addendum)
Occupational Therapy Assessment and Plan  Patient Details  Name: [name] Weber MRN: [id number] Date of Birth: 08-21-1931  OT Diagnosis: abnormal posture, acute pain, cognitive deficits and paraparesis at level T 10 Rehab Potential: Rehab Potential (ACUTE ONLY): Good ELOS: 21-23 days   Today's Date: 11/30/2014 OT Individual Time: 7341-9379 OT Individual Time Calculation (min): 60 min     Problem List:  Patient Active Problem List   Diagnosis Date Noted  . Neurogenic bladder 11/30/2014  . Neurogenic bowel 11/30/2014  . E. coli UTI 11/30/2014  . Thoracic myelopathy 11/29/2014  . Persistent atrial fibrillation 11/27/2014  . Traumatic spinal subdural hematoma   . Acute pulmonary embolism   . Orthostatic hypotension 11/22/2014  . Near syncope 11/21/2014  . Paraplegia at T9 level 11/17/2014  . Weakness of both legs   . Nocturnal leg cramps 07/16/2014  . Encounter for therapeutic drug monitoring 12/25/2013  . Malaise and fatigue 06/01/2011  . Atrial flutter 03/10/2011  . Encounter for long-term (current) use of anticoagulants 03/10/2011  . Benign hypertensive heart disease without heart failure 03/10/2011  . Hypercholesterolemia 03/10/2011  . Paroxysmal atrial flutter   . Palpitations   . PAC (premature atrial contraction)   . PVC's (premature ventricular contractions)   . Malaise   . Fatigue   . Myalgia     Past Medical History:  Past Medical History  Diagnosis Date  . Paroxysmal atrial flutter   . Palpitations     OCCASSIONAL  . PAC (premature atrial contraction)     ISOLATED  . PVC's (premature ventricular contractions)     ISOLATED  . Malaise   . Fatigue   . Myalgia    Past Surgical History:  Past Surgical History  Procedure Laterality Date  . Vulvar lesion removal  01/01/2009    She had a 1-cm area of erythema to the right of the urethral meatus  . Thoracic laminectomy for epidural abscess N/A 11/16/2014    Procedure: THORACIC LAMINECTOMY FOR EPIDURAL  ABSCESS;  Surgeon: Floyce Stakes, MD;  Location: [facility];  Service: Neurosurgery;  Laterality: N/A;    Assessment & Plan Clinical Impression: [name] Weber is a 81 y.o. female with a history of PAF on coumadin, HLD and no other real significant medical history who presented to 11/16/14 with  new low back pain with weakness and inability to walk. MRI of spine with abnormal cord signal due to hemorrhage/hematoma in the ventral spinal canal T 10- T 12 causing spinal chord compression. Her coumadin was emergently reversed and patient underwent thoracic laminectomy T 11 -T 12 decompression of epidural space and evacuation of SDH by Dr. Joya Salm the same day.  Post op with A flutter and cardiology assisting with rate control and BP management. he had transient improvement in BLE weakness and follow up CT lumbar/thoracis spine reveals post op changes with with hyperdense blood products in lower lumbar spine.   She developed hypotension requiring florinef for BP support and cardizem changed to digoxin to help with A flutter. She continues to have orthostatic changes affecting mobility and IVF was added for support.  CCS consulted for input on ongoing RLQ/abdominal pain and CT abdomen/pelvis ordered for workup and revealed RLL PE as well as prominent pancreatic duct, markedly distension bladder and stool throughout the colon with possible ileus.  Patient was transferred to ICU for closer monitoring and BLE dopplers with mobile DVT in CFV and in gastrocnemius as well as L-DVT in soleus vein. IVC filter placed by radiology  on 12/29 and patient cleared to start ASA for stroke prevention per Dr. Joya Salm.  Patient with resultant paraparesis as well as bowel/bladder incontinence. MD, family and rehab team requesting CIR for progressive therapy. Family now indicating ability to provide 24/7 care past discharge.   Patient transferred to CIR on 11/29/2014 .    Patient currently requires total with basic self-care  skills secondary to muscle weakness, decreased cardiorespiratoy endurance, decreased coordination, decreased awareness and decreased problem solving and decreased sitting balance, decreased standing balance and decreased postural control.  Prior to hospitalization, patient was fully independent, driving, and active in the community with volunteer work.  Patient will benefit from skilled intervention to increase independence with basic self-care skills prior to discharge home with care partner.  Anticipate patient will require intermittent supervision and follow up home health.  OT - End of Session Activity Tolerance: Tolerates 10 - 20 min activity with multiple rests OT Assessment Rehab Potential (ACUTE ONLY): Good Barriers to Discharge: Inaccessible home environment Barriers to Discharge Comments: 14 steps to enter landing for condo OT Patient demonstrates impairments in the following area(s): Balance;Cognition;Endurance;Motor;Pain;Skin Integrity;Sensory OT Basic ADL's Functional Problem(s): Bathing;Dressing;Toileting OT Advanced ADL's Functional Problem(s): Simple Meal Preparation OT Transfers Functional Problem(s): Toilet;Tub/Shower OT Additional Impairment(s): None OT Plan OT Intensity: Minimum of 1-2 x/day, 45 to 90 minutes OT Frequency: 5 out of 7 days OT Duration/Estimated Length of Stay: 21-23 days OT Treatment/Interventions: Balance/vestibular training;Cognitive remediation/compensation;Discharge planning;Community reintegration;DME/adaptive equipment instruction;Neuromuscular re-education;Pain management;Patient/family education;Functional mobility training;Self Care/advanced ADL retraining;Therapeutic Exercise;Therapeutic Activities;UE/LE Strength taining/ROM;UE/LE Coordination activities;Wheelchair propulsion/positioning;Psychosocial support OT Self Feeding Anticipated Outcome(s): I OT Basic Self-Care Anticipated Outcome(s): supervision OT Toileting Anticipated Outcome(s):  supervision OT Bathroom Transfers Anticipated Outcome(s): supervision OT Recommendation Patient destination: Home Follow Up Recommendations: Home health OT Equipment Recommended: 3 in 1 bedside comode;Tub/shower bench   Skilled Therapeutic Intervention Pt seen for initial evaluation, explanation of OT, discussion of pt's goals, and ADL retraining with bathing and dressing. Pt received in bed on bedpan. Pan removed as pt rolled with use of rails and no assist. Pt sat to EOB with mod A and needed time to achieve static sitting as she tended to lean R. Once she was in a good position pt was able to sit statically to engage in all UB self care with only supervision. Attempted a stand to RW, but unable to get pt to put weight through her legs to lift. Pt then engaged in lateral leans to wash her bottom. Her feet and pants donned over feet for her as she has back precautions.  Pt felt that she accidentally had a BM in the bed. Her nurse tech arrived to assist with clean up as therapist stood pt with total A. She had difficulty keeping feet placed under her as they kept sliding forward. She then completed a squat pivot with mod A x2 to recliner. Occasional c/o hip pain during movement, but pt is very "tough" and pushed through. Pt resting in recliner with call light and phone in reach.    OT Evaluation Precautions/Restrictions  Precautions Precautions: Fall;Back Precaution Comments: watch orthostatic BP Restrictions Weight Bearing Restrictions: No   Pain Pain Assessment Pain Assessment: 0-10 Pain Score: 6  Pain Type: Acute pain Pain Location: Hip Pain Orientation: Right Pain Descriptors / Indicators: Aching Pain Onset: With Activity Home Living/Prior Functioning Home Living Available Help at Discharge: Family Type of Home: Apartment Home Access: Stairs to enter Entrance Stairs-Number of Steps: 14 Home Layout: One level  Lives With: alone Prior Function Level  of Independence: Independent  with basic ADLs, Independent with homemaking with ambulation, Independent with transfers  Able to Take Stairs?: Yes Driving: Yes Vocation: Psychologist, occupational work Biomedical scientist: Insurance underwriter Leisure: Hobbies-yes (Comment) Comments: exercises daily ADL ADL ADL Comments: Refer to FIM Vision/Perception  Vision- History Baseline Vision/History: No visual deficits (cataract surgery) Patient Visual Report: No change from baseline Vision- Assessment Vision Assessment?: No apparent visual deficits Perception Comments: WFL  Cognition Overall Cognitive Status: Impaired/Different from baseline Orientation Level: Oriented X4 Awareness: Impaired Awareness Impairment: Anticipatory impairment Problem Solving: Impaired Problem Solving Impairment: Functional complex Sensation Sensation Light Touch: Impaired by gross assessment (Pt states sensation is just slightly diminished in LE) Stereognosis: Appears Intact Hot/Cold: Appears Intact Proprioception: Appears Intact Coordination Gross Motor Movements are Fluid and Coordinated: No (WFL UE, limited in LEs) Fine Motor Movements are Fluid and Coordinated: No (WFL UE, limited in LEs) Coordination and Movement Description: LE coordination limited due to weakness Motor  Motor Motor: Paraplegia Motor - Skilled Clinical Observations: decreased strength in lower torso and LEs Mobility    mod A x 2 for squat pivot transfer Trunk/Postural Assessment  Cervical Assessment Cervical Assessment: Within Functional Limits Thoracic Assessment Thoracic Assessment: Within Functional Limits Lumbar Assessment Lumbar Assessment: Exceptions to [facility] (posterior tilt due to weakness) Postural Control Postural Control: Deficits on evaluation Trunk Control: Pt able to sit statically, but has limited control with dynamic reach  Balance Static Sitting Balance Static Sitting - Level of Assistance: 5: Stand by assistance Dynamic Sitting Balance Dynamic Sitting  - Level of Assistance: 3: Mod assist Static Standing Balance Static Standing - Level of Assistance: 1: +2 Total assist Dynamic Standing Balance Dynamic Standing - Level of Assistance: Not tested (comment) Extremity/Trunk Assessment RUE Assessment RUE Assessment: Within Functional Limits LUE Assessment LUE Assessment: Within Functional Limits  FIM:  FIM - Eating Eating Activity: 7: Complete independence:no helper FIM - Grooming Grooming Steps: Wash, rinse, dry face;Wash, rinse, dry hands Grooming: 5: Set-up assist to obtain items FIM - Bathing Bathing Steps Patient Completed: Chest;Right Arm;Left Arm;Abdomen;Front perineal area;Buttocks;Right upper leg;Left upper leg Bathing: 4: Min-Patient completes 8-9 22f10 parts or 75+ percent FIM - Upper Body Dressing/Undressing Upper body dressing/undressing steps patient completed: Thread/unthread right bra strap;Thread/unthread left bra strap;Hook/unhook bra;Thread/unthread right sleeve of pullover shirt/dresss;Thread/unthread left sleeve of pullover shirt/dress;Put head through opening of pull over shirt/dress;Pull shirt over trunk Upper body dressing/undressing: 5: Supervision: Safety issues/verbal cues FIM - Lower Body Dressing/Undressing Lower body dressing/undressing: 1: Two helpers FIM - BIT sales professionalTransfer: 1: Two helpers;3: Supine > Sit: Mod A (lifting assist/Pt. 50-74%/lift 2 legs FIM - TAir cabin crewTransfers: 0-Activity did not occur FIM - Tub/Shower Transfers Tub/shower Transfers: 0-Activity did not occur or was simulated   Refer to Care Plan for Long Term Goals  Recommendations for other services: None  Discharge Criteria: Patient will be discharged from OT if patient refuses treatment 3 consecutive times without medical reason, if treatment goals not met, if there is a change in medical status, if patient makes no progress towards goals or if patient is discharged from hospital.  The above  assessment, treatment plan, treatment alternatives and goals were discussed and mutually agreed upon: by patient  [facility]1/11/2014, 9:26 AM

## 2014-12-01 ENCOUNTER — Inpatient Hospital Stay (HOSPITAL_COMMUNITY): Payer: Medicare Other | Admitting: *Deleted

## 2014-12-01 MED ORDER — SORBITOL 70 % SOLN
960.0000 mL | TOPICAL_OIL | Freq: Once | ORAL | Status: DC
  Filled 2014-12-01: qty 240

## 2014-12-01 MED ORDER — SORBITOL 70 % SOLN
60.0000 mL | Status: AC
  Administered 2014-12-01: 60 mL via ORAL
  Filled 2014-12-01 (×2): qty 60

## 2014-12-01 NOTE — IPOC Note (Addendum)
Overall Plan of Care [facility]) Patient Details Name: [name] Weber MRN: [id number] DOB: 1931/07/31  Admitting Diagnosis: t10 12 hematoma with paraplegia   Hospital Problems: Principal Problem:   Paraplegia at T9 level Active Problems:   Thoracic myelopathy   Neurogenic bladder   Neurogenic bowel   E. coli UTI     Functional Problem List: Nursing Bladder, Bowel, Nutrition, Pain, Skin Integrity  PT Balance, Endurance, Motor, Pain, Safety, Sensory, Skin Integrity  OT Balance, Cognition, Endurance, Motor, Pain, Skin Integrity, Sensory  SLP    TR Activity tolerance, functional mobility, balance, cognition, safety, pain       Basic ADL's: OT Bathing, Dressing, Toileting     Advanced  ADL's: OT Simple Meal Preparation     Transfers: PT Bed Mobility, Bed to Chair, Car, Occupational psychologist, Research scientist (life sciences): PT Ambulation, Psychologist, prison and probation services, Stairs     Additional Impairments: OT None  SLP        TR      Anticipated Outcomes Item Anticipated Outcome  Self Feeding I  Swallowing      Basic self-care  supervision  Toileting  supervision   Bathroom Transfers supervision  Bowel/Bladder  Mod I  Transfers  Supervision  Locomotion  Min A   Communication     Cognition     Pain  <4  Safety/Judgment  Supervision   Therapy Plan: PT Intensity: Minimum of 1-2 x/day ,45 to 90 minutes PT Frequency: 5 out of 7 days PT Duration Estimated Length of Stay: 21-230 days OT Intensity: Minimum of 1-2 x/day, 45 to 90 minutes OT Frequency: 5 out of 7 days OT Duration/Estimated Length of Stay: 21-23 days  TR Duration/ELOS:  3 weeks TR Frequency:  Min 1 time per week >20 minutes        Team Interventions: Nursing Interventions Bladder Management, Bowel Management, Disease Management/Prevention, Pain Management, Skin Care/Wound Management  PT interventions Balance/vestibular training, Ambulation/gait training, Cognitive remediation/compensation, Community  reintegration, Discharge planning, Disease management/prevention, DME/adaptive equipment instruction, Functional mobility training, Neuromuscular re-education, Pain management, Patient/family education, Psychosocial support, Skin care/wound management, Splinting/orthotics, Stair training, Therapeutic Activities, UE/LE Strength taining/ROM, Therapeutic Exercise, UE/LE Coordination activities, Wheelchair propulsion/positioning  OT Interventions Warden/ranger, Cognitive remediation/compensation, Discharge planning, Community reintegration, DME/adaptive equipment instruction, Neuromuscular re-education, Pain management, Patient/family education, Functional mobility training, Self Care/advanced ADL retraining, Therapeutic Exercise, Therapeutic Activities, UE/LE Strength taining/ROM, UE/LE Coordination activities, Wheelchair propulsion/positioning, Psychosocial support  SLP Interventions    TR Interventions   Recreation/leisure participation, Balance/Vestibular training, functional mobility, therapeutic activities, UE/LE strength/coordination, w/c mobility, community reintegration, pt/family education, adaptive equipment instruction/use, discharge planning, psychosocial support  SW/CM Interventions      Team Discharge Planning: Destination: PT-Home (Lofty goal due to flight of stairs) ,OT- Home , SLP-  Projected Follow-up: PT-Home health PT, 24 hour supervision/assistance, OT-  Home health OT, SLP-  Projected Equipment Needs: PT-Rolling walker with 5" wheels, Wheelchair (measurements), Wheelchair cushion (measurements), To be determined, OT- 3 in 1 bedside comode, Tub/shower bench, SLP-  Equipment Details: PT-WC and/or RW TBD based on progress, OT-  Patient/family involved in discharge planning: PT- Patient,  OT-Patient, SLP-   MD ELOS: 20-24 days Medical Rehab Prognosis:  Excellent Assessment: The patient has been admitted for CIR therapies with the diagnosis of spinal cord hematoma with  paraplegia. The team will be addressing functional mobility, strength, stamina, balance, safety, adaptive techniques and equipment, self-care, bowel and bladder mgt, patient and caregiver education, pain control, NMR, ego support, activity tolerance, skin care,  orthotics. Goals have been set at supervision for basic self-care and ADL's and supervision for w/c mobility and transfers--min assist for gait.    Ranelle Oyster, MD, FAAPMR      See Team Conference Notes for weekly updates to the plan of care

## 2014-12-01 NOTE — Progress Notes (Signed)
[place] PHYSICAL MEDICINE & REHABILITATION     PROGRESS NOTE    Subjective/Complaints: Sleeping soundly upon my entry. Had one small, hard bm yesterday with enema A  review of systems has been performed and if not noted above is otherwise negative.   Objective: Vital Signs: Blood pressure 128/70, pulse 69, temperature 98.6 F (37 C), temperature source Oral, resp. rate 18, SpO2 98 %. Dg Abd 1 View  11/29/2014   CLINICAL DATA:  Follow-up ileus or constipation.  EXAM: ABDOMEN - 1 VIEW  COMPARISON:  November 26, 2014.  FINDINGS: IVC filter is noted. Residual contrast is noted throughout the colon. There is no evidence of bowel dilatation. Moderate residual stool is noted.  IMPRESSION: Moderate stool burden is noted suggesting constipation.   Electronically Signed   By: Roque Lias M.D.   On: 11/29/2014 20:19    Recent Labs  11/30/14 0713  WBC 5.5  HGB 11.6*  HCT 34.4*  PLT 220    Recent Labs  11/30/14 0713  NA 134*  K 3.9  CL 99  GLUCOSE 106*  BUN <5*  CREATININE 0.55  CALCIUM 8.3*   CBG (last 3)  No results for input(s): GLUCAP in the last 72 hours.  Wt Readings from Last 3 Encounters:  11/17/14 59.8 kg (131 lb 13.4 oz)  11/13/14 48.535 kg (107 lb)  07/16/14 48.988 kg (108 lb)    Physical Exam:  Constitutional: She is oriented to person, place, and time. She appears well-developed. She appears cachectic.  HENT: oral mucosa moist, dentition good Head: Normocephalic and atraumatic.  Eyes: Conjunctivae are normal. Pupils are equal, round, and reactive to light.  Neck: Normal range of motion. Neck supple.  Cardiovascular: Normal rate.  Respiratory: Effort normal and breath sounds normal. No respiratory distress. She has no wheezes.  GI: Soft. Bowel sounds are normal. She exhibits no distension.  Mild fullness noted RLQ with some tenderness.  Musculoskeletal: She exhibits no edema or tenderness.  Neurological: She is alert and oriented to person,  place, and time.  Speech clear. Follows commands without difficulty. decreased proprioception/LT in both legs. Strength: 3/5HF,KE bilaterally and 4- ADF/APF. UE's 5/5.  Skin: Skin is warm and dry. Back incision is clean,dry and intact.   Assessment/Plan: 1. Functional deficits secondary to T10-12 hematoma with subsequent myelopathy and paraplegia which require 3+ hours per day of interdisciplinary therapy in a comprehensive inpatient rehab setting. Physiatrist is providing close team supervision and 24 hour management of active medical problems listed below. Physiatrist and rehab team continue to assess barriers to discharge/monitor patient progress toward functional and medical goals. FIM: FIM - Bathing Bathing Steps Patient Completed: Chest, Right Arm, Left Arm, Abdomen, Front perineal area, Buttocks, Right upper leg, Left upper leg Bathing: 4: Min-Patient completes 8-9 55f 10 parts or 75+ percent  FIM - Upper Body Dressing/Undressing Upper body dressing/undressing steps patient completed: Thread/unthread right bra strap, Thread/unthread left bra strap, Hook/unhook bra, Thread/unthread right sleeve of pullover shirt/dresss, Thread/unthread left sleeve of pullover shirt/dress, Put head through opening of pull over shirt/dress, Pull shirt over trunk Upper body dressing/undressing: 5: Supervision: Safety issues/verbal cues FIM - Lower Body Dressing/Undressing Lower body dressing/undressing steps patient completed: Pull pants up/down Lower body dressing/undressing: 1: Total-Patient completed less than 25% of tasks  FIM - Toileting Toileting: 1: Total-Patient completed zero steps, helper did all 3  FIM - Archivist Transfers: 0-Activity did not occur  FIM - Banker Devices: Sliding board Bed/Chair Transfer: 3: Supine >  Sit: Mod A (lifting assist/Pt. 50-74%/lift 2 legs, 3: Sit > Supine: Mod A (lifting assist/Pt. 50-74%/lift 2 legs), 2:  Bed > Chair or W/C: Max A (lift and lower assist), 2: Chair or W/C > Bed: Max A (lift and lower assist)  FIM - Locomotion: Wheelchair Distance: 150 Locomotion: Wheelchair: 5: Travels 150 ft or more: maneuvers on rugs and over door sills with supervision, cueing or coaxing FIM - Locomotion: Ambulation Locomotion: Ambulation Assistive Devices: Designer, industrial/product Ambulation/Gait Assistance: 1: +2 Total assist Locomotion: Ambulation: 1: Two helpers  Comprehension Comprehension Mode: Auditory Comprehension: 5-Follows basic conversation/direction: With extra time/assistive device  Expression Expression Mode: Verbal Expression: 5-Expresses complex 90% of the time/cues < 10% of the time  Social Interaction Social Interaction: 6-Interacts appropriately with others with medication or extra time (anti-anxiety, antidepressant).  Problem Solving Problem Solving: 5-Solves basic 90% of the time/requires cueing < 10% of the time  Memory Memory: 5-Recognizes or recalls 90% of the time/requires cueing < 10% of the time  Medical Problem List and Plan: 1. Functional deficits secondary to T 10-12 hematoma with myelopathy and paraplegia 2. PE/BLE DVT /Anticoagulation: Mechanical: Sequential compression devices, below knee Bilateral lower extremities No blood thinner due to thoracic hematoma--verified with Dr. Jordan Likes  3. Pain Management: Continue oxycodone prn.  4. Mood: LCSW to follow for evaluation and support.  5. Neuropsych: This patient is not quite capable of making decisions on her own behalf. 6. Skin/Wound Care:  7. Fluids/Electrolytes/Nutrition: Monitor I/O.  Intake improving with family bring in food.    -supps  8. E coli UTI: Will start suprax for treatment x 7 days  9. Neurogenic bowel: constipation on KUB. Sorbitol 60cc today followed by another SMOG enema today  -work on regular regimen also 10. Neurogenic bladder: Continue foley for for now and start voiding trial once bowels  normalized.  11. Hyponatremia: Slowly improving. Continue NaCl tabs Recheck in am.  12. Hypotension: Will continue florinef. Change flomax to bedtime to avoid drop during the day. Add abdominal binder and TEDs to help with BP support.  13. Afib: ON ASA for stroke prevention. 14. Hypokalemia:  Follow up labs, supplement   LOS (Days) 2 A FACE TO FACE EVALUATION WAS PERFORMED  SWARTZ,ZACHARY T 12/01/2014 8:03 AM

## 2014-12-01 NOTE — Progress Notes (Signed)
Occupational Therapy Session Note  Patient Details  Name: [name] Weber MRN: [id number] Date of Birth: 1931/03/24  Today's Date: 12/01/2014 OT Individual Time:  - 1300-1400 (60 min)  1st session    Short Term Goals: Week 1:  OT Short Term Goal 1 (Week 1): Pt will be able to don pants over feet with use of reacher. OT Short Term Goal 2 (Week 1): Pt will be able to stand from w/c with mod A to be able to pull pants over hips. OT Short Term Goal 3 (Week 1): Pt will be able to transfer to a tub bench with mod A. OT Short Term Goal 4 (Week 1): Pt will be able to transfer to a BSC drop arm with mod A x 1. OT Short Term Goal 5 (Week 1): Pt will be able to bathe feet with long handled sponge. Week 2:     Skilled Therapeutic Interventions/Progress Updates:    1st session:  Pt.. Lying in bed upon OT arrival.  She reported having episodes of diarrahea.  Addressed UE AROM using 2# weights on each arm and theraband.   Addressed LB AROm with no resistance.Pt. Performed each exercise for 20 reps.  Performed rolling to address more independence with bed mobility.  Pt. Completed and left in bed with all needs in reach.    Therapy Documentation Precautions:  Precautions Precautions: Fall, Back Precaution Booklet Issued: No Precaution Comments: watch orthostatic BP Restrictions Weight Bearing Restrictions: No       Pain:  none 1st and 2nd session   ADL: ADL ADL Comments: Refer to FIM    Other Treatments:    2nd session:  Time:1445-1800  (75 min) Pain:  none Individual session:  Pt. Lying in bed.  Agreed to shower. Addressed bed>wc>shower bench>wc transfers with sliding mod assist.  Used sliding board for bed to wc but did not for others.  Educated pt on using arms to pull across and not pull on therapist.  Pt. Bathed self with SBA for sitting balance.  Unable to balance safely to reach feet.  Transferred back to wc with minimal assist.  Performed dressing at sink with pt pushing to half  standing for OT to don pants.  Pt. Completed grooming at SBA.  Left pt in wc with call bell,phone within reach.      See FIM for current functional status  Therapy/Group: Individual Therapy  Humberto Seals 12/01/2014, 1:42 PM

## 2014-12-02 ENCOUNTER — Inpatient Hospital Stay (HOSPITAL_COMMUNITY): Payer: Medicare Other | Admitting: Occupational Therapy

## 2014-12-02 ENCOUNTER — Inpatient Hospital Stay (HOSPITAL_COMMUNITY): Payer: Medicare Other

## 2014-12-02 ENCOUNTER — Encounter (HOSPITAL_COMMUNITY): Payer: Medicare Other | Admitting: Occupational Therapy

## 2014-12-02 NOTE — Progress Notes (Signed)
Occupational Therapy Session Note  Patient Details  Name: [name] Weber MRN: [id number] Date of Birth: Jun 25, 1931  Today's Date: 12/02/2014 OT Individual Time: 0900-1000 OT Individual Time Calculation (min): 60 min    Short Term Goals: Week 1:  OT Short Term Goal 1 (Week 1): Pt will be able to don pants over feet with use of reacher. OT Short Term Goal 2 (Week 1): Pt will be able to stand from w/c with mod A to be able to pull pants over hips. OT Short Term Goal 3 (Week 1): Pt will be able to transfer to a tub bench with mod A. OT Short Term Goal 4 (Week 1): Pt will be able to transfer to a BSC drop arm with mod A x 1. OT Short Term Goal 5 (Week 1): Pt will be able to bathe feet with long handled sponge.  Skilled Therapeutic Interventions/Progress Updates:    1:1 Pt was in bed on the bed pan when arrived. Pt with soft stool with mucus parts in stool- reported to RN.  Focus on reviewing back precautions- pt unable to verbalize them. Education and practiced "log rolling" to the right/ left with bed rail for removal of bed pan and for peri hygiene and clothing management. Pt required max verbal and tactile cues to avoid twisting and to maintain knee flexion with rolling. Once transitioned to EOB, pt able to maintain sitting balance and balance when obtaining items off bedside table (outside of BOS) with close supervision. Pt does report pain with any movement in her lower back/ hip on her right side. Pt transferred from bed to w/c with min A squat pivot with max instructional cues for sequence. Transitioned to the gym and focused on education and practice in sitting up w/c in prep for transfers (including removing leg rest and arm rest). Pt transferred w/c<mat with min A. Performed sit to stand 3x with more than reasonable amt of time and min to mod A. Focus on anteriorly tilting pelvis in standing and trunk and hip control. Able to stand for 10 -15 sec each with min to mod A. Therapist guarding  bilateral knees but did not need to block or maintain contact. Pt able to propel back to room- education on energy conservation techniques with propelling w/c. Continued discussion about w/c planning.    Therapy Documentation Precautions:  Precautions Precautions: Fall, Back Precaution Booklet Issued: No Precaution Comments: watch orthostatic BP Restrictions Weight Bearing Restrictions: No General:   Vital Signs: Therapy Vitals Pulse Rate: 80 (Irregular) Pain: Pain Assessment Pain Assessment: 0-10 Pain Score: 7  Pain Type: Acute pain Pain Location: Back Pain Orientation: Lower Pain Descriptors / Indicators: Aching Pain Intervention(s): Medication (See eMAR), ice applied after session to right lower back /hip  ADL: ADL ADL Comments: Refer to FIM  See FIM for current functional status  Therapy/Group: Individual Therapy  Roney Mans [facility] 12/02/2014, 10:18 AM

## 2014-12-02 NOTE — Progress Notes (Signed)
Occupational Therapy Session Note  Patient Details  Name: [name] Weber MRN: [id number] Date of Birth: 1931/04/05  Today's Date: 12/02/2014 OT Individual Time: 4098-1191 OT Individual Time Calculation (min): 30 min    Short Term Goals: Week 1:  OT Short Term Goal 1 (Week 1): Pt will be able to don pants over feet with use of reacher. OT Short Term Goal 2 (Week 1): Pt will be able to stand from w/c with mod A to be able to pull pants over hips. OT Short Term Goal 3 (Week 1): Pt will be able to transfer to a tub bench with mod A. OT Short Term Goal 4 (Week 1): Pt will be able to transfer to a BSC drop arm with mod A x 1. OT Short Term Goal 5 (Week 1): Pt will be able to bathe feet with long handled sponge.  Skilled Therapeutic Interventions/Progress Updates:    1:1 Continued to focus on sit to stand in standing frame with attention to maintaining trunk control and upright posture and controlling bilateral knee extension without hyperextending knees. Pt able to stand in standing frame with even weight through both LEs and at times lift one UE off surface maintain trunk control and good posture. Transitioned to sit to stand and standing with RW. Pt with increased fatigue in bilateral LEs only able to tolerate standing for a couple of seconds. Pt able to advance one leg forward at a time but increased incoordination and control with stand step pivots with RW- requiring ot sit back down in w/c. Discussed focus on session was on problem solving toileting and toilet transfer methods (transfers squat pivot v. Slide board and toileting with lateral leans v standing with A for clothing management. Pt sat up for lunch but then recommended to lay back down afterwards to rest before next therapy session.   Therapy Documentation Precautions:  Precautions Precautions: Fall, Back Precaution Booklet Issued: No Precaution Comments: watch orthostatic BP Restrictions Weight Bearing Restrictions:  No Pain: 8-9 /10 in lower back on right side; repositioned and applied ice  ADL: ADL ADL Comments: Refer to FIM \ See FIM for current functional status  Therapy/Group: Individual Therapy  Roney Mans [facility] 12/02/2014, 12:15 PM

## 2014-12-02 NOTE — Progress Notes (Signed)
[employer] PHYSICAL MEDICINE & REHABILITATION     PROGRESS NOTE    Subjective/Complaints: Had large bm. Feeling better. Excited about therapy today A  review of systems has been performed and if not noted above is otherwise negative.   Objective: Vital Signs: Blood pressure 123/54, pulse 81, temperature 98.7 F (37.1 C), temperature source Oral, resp. rate 17, SpO2 97 %. No results found.  Recent Labs  11/30/14 0713  WBC 5.5  HGB 11.6*  HCT 34.4*  PLT 220    Recent Labs  11/30/14 0713  NA 134*  K 3.9  CL 99  GLUCOSE 106*  BUN <5*  CREATININE 0.55  CALCIUM 8.3*   CBG (last 3)  No results for input(s): GLUCAP in the last 72 hours.  Wt Readings from Last 3 Encounters:  11/17/14 59.8 kg (131 lb 13.4 oz)  11/13/14 48.535 kg (107 lb)  07/16/14 48.988 kg (108 lb)    Physical Exam:  Constitutional: She is oriented to person, place, and time. She appears well-developed.  HENT: oral mucosa moist, dentition good Head: Normocephalic and atraumatic.  Eyes: Conjunctivae are normal. Pupils are equal, round, and reactive to light.  Neck: Normal range of motion. Neck supple.  Cardiovascular: Normal rate.  Respiratory: Effort normal and breath sounds normal. No respiratory distress. She has no wheezes.  GI: Soft. Bowel sounds are normal. She exhibits no distension.  Non-tender.  Musculoskeletal: She exhibits no edema or tenderness.  Neurological: She is alert and oriented to person, place, and time.  Speech clear. Follows commands without difficulty. decreased proprioception/LT in both legs. Strength: 3/5HF,KE bilaterally and 4- ADF/APF. UE's 5/5.  Skin: Skin is warm and dry. Back incision is clean,dry and intact.   Assessment/Plan: 1. Functional deficits secondary to T10-12 hematoma with subsequent myelopathy and paraplegia which require 3+ hours per day of interdisciplinary therapy in a comprehensive inpatient rehab setting. Physiatrist is providing close  team supervision and 24 hour management of active medical problems listed below. Physiatrist and rehab team continue to assess barriers to discharge/monitor patient progress toward functional and medical goals. FIM: FIM - Bathing Bathing Steps Patient Completed: Chest, Right Arm, Left Arm, Abdomen, Front perineal area, Buttocks, Right upper leg, Left upper leg Bathing: 4: Min-Patient completes 8-9 24f 10 parts or 75+ percent  FIM - Upper Body Dressing/Undressing Upper body dressing/undressing steps patient completed: Thread/unthread right bra strap, Thread/unthread left bra strap, Thread/unthread right sleeve of pullover shirt/dresss, Thread/unthread left sleeve of pullover shirt/dress, Put head through opening of pull over shirt/dress, Pull shirt over trunk Upper body dressing/undressing: 4: Min-Patient completed 75 plus % of tasks FIM - Lower Body Dressing/Undressing Lower body dressing/undressing steps patient completed: Don/Doff right sock, Don/Doff left sock Lower body dressing/undressing: 1: Total-Patient completed less than 25% of tasks  FIM - Toileting Toileting: 1: Total-Patient completed zero steps, helper did all 3  FIM - Archivist Transfers: 0-Activity did not occur  FIM - Banker Devices: Sliding board Bed/Chair Transfer: 3: Supine > Sit: Mod A (lifting assist/Pt. 50-74%/lift 2 legs, 3: Sit > Supine: Mod A (lifting assist/Pt. 50-74%/lift 2 legs), 3: Bed > Chair or W/C: Mod A (lift or lower assist)  FIM - Locomotion: Wheelchair Distance: 150 Locomotion: Wheelchair: 5: Travels 150 ft or more: maneuvers on rugs and over door sills with supervision, cueing or coaxing FIM - Locomotion: Ambulation Locomotion: Ambulation Assistive Devices: Designer, industrial/product Ambulation/Gait Assistance: 1: +2 Total assist Locomotion: Ambulation: 1: Two helpers  Comprehension Comprehension Mode:  Auditory Comprehension: 5-Follows basic  conversation/direction: With extra time/assistive device  Expression Expression Mode: Verbal Expression: 5-Expresses basic 90% of the time/requires cueing < 10% of the time.  Social Interaction Social Interaction: 6-Interacts appropriately with others with medication or extra time (anti-anxiety, antidepressant).  Problem Solving Problem Solving: 5-Solves basic 90% of the time/requires cueing < 10% of the time  Memory Memory: 5-Recognizes or recalls 90% of the time/requires cueing < 10% of the time  Medical Problem List and Plan: 1. Functional deficits secondary to T 10-12 hematoma with myelopathy and paraplegia 2. PE/BLE DVT /Anticoagulation: Mechanical: Sequential compression devices, below knee Bilateral lower extremities No blood thinner due to thoracic hematoma--verified with Dr. Jordan Likes  3. Pain Management: Continue oxycodone prn.  4. Mood: LCSW to follow for evaluation and support.  5. Neuropsych: This patient is not quite capable of making decisions on her own behalf. 6. Skin/Wound Care:  7. Fluids/Electrolytes/Nutrition: Monitor I/O.  Intake improving with family bring in food.    -supps  8. E coli UTI: Will start suprax for treatment x 7 days  9. Neurogenic bowel: large bm yesterday  -regular bowel regimen 10. Neurogenic bladder: Continue foley for for now and start voiding trial early this coming week 11. Hyponatremia: Slowly improving. Continue NaCl tabs Recheck in am.  12. Hypotension: Will continue florinef. Change flomax to bedtime to avoid drop during the day. Add abdominal binder and TEDs to help with BP support.  13. Afib: ON ASA for stroke prevention. 14. Hypokalemia:  Follow up labs, supplement   LOS (Days) 3 A FACE TO FACE EVALUATION WAS PERFORMED  SWARTZ,ZACHARY T 12/02/2014 8:06 AM

## 2014-12-02 NOTE — Progress Notes (Signed)
Physical Therapy Session Note  Patient Details  Name: [name] Weber MRN: [id number] Date of Birth: Oct 07, 1931  Today's Date: 12/02/2014  Short Term Goals: Week 1:  PT Short Term Goal 1 (Week 1): Pt will perform bed mobility with Min A using rolling technique. PT Short Term Goal 2 (Week 1): Pt will perform sliding board transfer with Mod A  PT Short Term Goal 3 (Week 1): Pt will perform dynamic sitting balance x5 min with close S PT Short Term Goal 4 (Week 1): Pt will ambualte 90' with assist of 1 and RW PT Short Term Goal 5 (Week 1): Pt will stand x5 min during functional task with min A  Session #1: PT Individual Time: 1045-1130 PT Individual Time Calculation (min): 45 min  Reports pain in R low back - was using ice and premedicated. Session focused on w/c propulsion down to therapy gym for general strength and endurance, squat pivot transfers with emphasis on w/c parts management and technique (anterior weightshift and hand placement) with min to mod A (light mod A needed on return transfer to clear bottom), sit to stands x5 with RW from EOM with focus on technique including hand placement anterior weightshift through pelvis with min to mod A and able to maintain up to close to 20-30 seconds before seated break needed, sitting balance EOM with S (1 rest break needed propped on therapist due to low back pain), stair negotiation in preparation for training for home entry with +2 mod A for safety and to provide manual facilitation at quad BLE (pt reports orthostatic symptoms after up/down 1 step so deferred further stairs), and seated LE strengthening with focus on motor control and maintaining knee extension. Therapist also applied basic back to w/c for improved lumbar support for posture. Pt reports it felt more comfortable. Left in gym for next OT session.   BP taken after stairs = 120/72 mmHg but symptoms had already resolved once back in seated position.  Session #2: PT Individual Time:  1445 -1535  PT Individual Time Calculation (min): 50 min  Reports pain in back is not present during rest but increases with mobility. Re-educated on back precautions with bed mobility. Pt able to perform supine to sit using logroll technique with min A and performed min A squat pivot transfer into w/c. Expressed concerns in regards to d/c plan and unsure of what assistance she will have at home. Therapist expressed major concern at this time is also home entry (flight of stairs) and pt offered that if she goes into the garage, it would be 7 stairs with 1 rail which would be much more manageable. LTG changed to reflect this. Still discussed recommendation to have someone stay with her or she stay with someone initially for safety and pt in agreement but not sure who this could be. She expresses that she does not want to go to a SNF but understands if she has to, she will do what is needed. Discussed that she has just started stay on rehab, goals and ELOS and pt verbalized understanding and felt better. Focused rest of session on neuro re-ed to BLE on Nustep as well as overall strength and endurance with focus on positioning of BLE on Nustep (not letting LE's adduct or abduct) to work on motor control also x 15 min on level 2 and last 10 min on level 3. Performed mod A transfer to Nustep and min A transfer off of Nustep. Returned to bed end of session due to  back pain with min A for transfer and positioning in the bed. All needs in place.   Therapy Documentation Precautions:  Precautions Precautions: Fall, Back Precaution Booklet Issued: No Precaution Comments: watch orthostatic BP Restrictions Weight Bearing Restrictions: No  See FIM for current functional status  Therapy/Group: Individual Therapy  Karolee Stamps [facility] 12/02/2014, 3:50 PM

## 2014-12-03 ENCOUNTER — Inpatient Hospital Stay (HOSPITAL_COMMUNITY): Payer: Medicare Other | Admitting: Occupational Therapy

## 2014-12-03 ENCOUNTER — Encounter (HOSPITAL_COMMUNITY): Payer: Medicare Other | Admitting: Occupational Therapy

## 2014-12-03 ENCOUNTER — Inpatient Hospital Stay (HOSPITAL_COMMUNITY): Payer: Medicare Other

## 2014-12-03 LAB — BASIC METABOLIC PANEL
Anion gap: 6 (ref 5–15)
BUN: 5 mg/dL — ABNORMAL LOW (ref 6–23)
CO2: 29 mmol/L (ref 19–32)
Calcium: 8.2 mg/dL — ABNORMAL LOW (ref 8.4–10.5)
Chloride: 100 mEq/L (ref 96–112)
Creatinine, Ser: 0.55 mg/dL (ref 0.50–1.10)
GFR calc Af Amer: >90 mL/min (ref >90)
GFR calc non Af Amer: 86 mL/min — ABNORMAL LOW (ref >90)
Glucose, Bld: 96 mg/dL (ref 70–99)
Potassium: 3.9 mmol/L (ref 3.5–5.1)
Sodium: 135 mmol/L (ref 135–145)

## 2014-12-03 MED ORDER — CEFIXIME 400 MG PO TABS
400.0000 mg | ORAL_TABLET | Freq: Every day | ORAL | Status: AC
  Administered 2014-12-04 – 2014-12-05 (×2): 400 mg via ORAL
  Filled 2014-12-03 (×2): qty 1

## 2014-12-03 NOTE — Plan of Care (Signed)
Problem: RH BOWEL ELIMINATION Goal: RH STG MANAGE BOWEL WITH ASSISTANCE STG Manage Bowel with Assistance. Mod I  Outcome: Progressing Pt wears briefs and is incontinent @ times. Has suppository scheduled qam Goal: RH STG MANAGE BOWEL W/MEDICATION W/ASSISTANCE STG Manage Bowel with Medication with Assistance.  Suppository scheduled q am  Problem: RH BLADDER ELIMINATION Goal: RH STG MANAGE BLADDER WITH EQUIPMENT WITH ASSISTANCE STG Manage Bladder With Equipment With Assistance. Mod I  Outcome: Not Progressing Foley cath

## 2014-12-03 NOTE — Progress Notes (Signed)
Occupational Therapy Session Notes  Patient Details  Name: NOYA SANTARELLI MRN: [id number] Date of Birth: Mar 30, 1931  Today's Date: 12/03/2014  Short Term Goals: Week 1:  OT Short Term Goal 1 (Week 1): Pt will be able to don pants over feet with use of reacher. OT Short Term Goal 2 (Week 1): Pt will be able to stand from w/c with mod A to be able to pull pants over hips. OT Short Term Goal 3 (Week 1): Pt will be able to transfer to a tub bench with mod A. OT Short Term Goal 4 (Week 1): Pt will be able to transfer to a BSC drop arm with mod A x 1. OT Short Term Goal 5 (Week 1): Pt will be able to bathe feet with long handled sponge.  Skilled Therapeutic Interventions/Progress Updates:   Session #1 [id number] - 60 Minutes Individual Therapy Patient with 7/10 complaints of pain in right hip during movement, no medication needed during session; RN aware of pain Patient received supine in bed. Patient would grimace in pain during some transitional movements. Focused skilled intervention on bed positioning for pain management, bed mobility for functional tasks (including donning of brief), LB ADL retraining at bed level with HOB raised and lowered prn, UB ADL at sink level with BLEs supported using foot rests,  adhering to back precautions throughout mobility (patient required up to moderate verbal cues to safely adhere to precautions), functional transfers, overall activity tolerance/endurance, functional w/c mobility/maneuvering throughout room.  Towards end of session, patient with request to use bathroom ("bedpan"). Therapist recommended using BSC over elevated toilet seat in BR. Patient propelled self into BR and transferred onto [facility] with moderate assistance and min verbal cues for safe and effective transfers. Patient with incontinent bowel movement and therapist assisted with clean-up. Left patient seated on BSC with PT entering room.   Session #2 1400-1500 - 60 Minutes Individual  Therapy Patient with complaints of muscle spasms, RN aware Patient received supine in bed willing to work with therapist. Patient engaged in bed mobility, sat EOB, and transferred > w/c with min assist. Patient then propelled self from room> therapy gym. Therapeutic activity focusing on sit<>stand using standing frame, dynamic standing balance/tolerance/endurance, and overall activity tolerance/endurance. Patient able to stand for a max of 3 minutes and was able to perform sit<>stand X4. Patient with slight dizziness:  BP=124/74, HR=81, 02=97%. Patient propelled self back to room. Therapist educated patient on pressure relief techniques and importance of performing pressure relief when up in the wheelchair every 30 minutes for at least 2-3 minutes and pressure relief every 2 hours in bed. At end of session, therapist left patient seated in w/c with all needs within reach.   Precautions:  Precautions Precautions: Fall, Back Precaution Booklet Issued: No Precaution Comments: watch orthostatic BP Restrictions Weight Bearing Restrictions: No   See FIM for current functional status  Juniper Snyders 12/03/2014, 7:20 AM

## 2014-12-03 NOTE — Plan of Care (Signed)
Problem: RH BOWEL ELIMINATION Goal: RH STG MANAGE BOWEL WITH ASSISTANCE STG Manage Bowel with Assistance. Mod A Outcome: Not Progressing Requiring daily enema

## 2014-12-03 NOTE — Plan of Care (Signed)
Problem: RH PAIN MANAGEMENT Goal: RH STG PAIN MANAGED AT OR BELOW PT'S PAIN GOAL <5  Outcome: Progressing Rates pain as 5

## 2014-12-03 NOTE — Progress Notes (Signed)
Physical Therapy Make up Session Note  Patient Details  Name: [name] Weber MRN: [id number] Date of Birth: May 22, 1931  Today's Date: 12/03/2014 PT Individual Time: 1023 (make up session)-1110 PT Individual Time Calculation (min): 47 min   Short Term Goals: Week 1:  PT Short Term Goal 1 (Week 1): Pt will perform bed mobility with Min A using rolling technique. PT Short Term Goal 2 (Week 1): Pt will perform sliding board transfer with Mod A  PT Short Term Goal 3 (Week 1): Pt will perform dynamic sitting balance x5 min with close S PT Short Term Goal 4 (Week 1): Pt will ambualte 49' with assist of 1 and RW PT Short Term Goal 5 (Week 1): Pt will stand x5 min during functional task with min A  Skilled Therapeutic Interventions/Progress Updates:   Pt received sitting in recliner in room, agreeable to make up session.  Skilled session focused on functional transfers, w/c mobility in controlled and home environment as well as gait with use of maxi sky and RW.   Performed recliner>w/c and w/c<>mat and w/c>bed squat pivot transfers at min A level.  Continue to note that she tends to avoid forward trunk lean with decreased WB through LEs but better with cues and facilitation.  Performed over 200' w/c mobility throughout session at S level with BUEs in controlled and home environment to better simulate home and work on overall strengthening and endurance.  Min cues for making tighter turns to avoid hurting hands, otherwise did very well.  Performed gait x 20' x 1, and 40' x 2 reps with use of maxi ski and RW at mod A level (+2 for chair follow).  Pt initially requiring mod cues for sequencing and technique with RW, however as she progressed, technique became better with improved stride length and RW placement.  Did not note any overt knee buckling during gait, but did note turns to be difficult and as she continued and fatigued, noted increased UE use through RW.  Pt propelled back to room at end of session  and transferred to bed.  Assisted into supine with mod A and mod cues for maintaining back precautions.  Left in bed with RT present for next session.   Therapy Documentation Precautions:  Precautions Precautions: Fall, Back Precaution Booklet Issued: No Precaution Comments: watch orthostatic BP Restrictions Weight Bearing Restrictions: No   Vital Signs: Therapy Vitals Pulse Rate: 62 Pain: 5/10 when sitting, better with standing and assisted back to bed at end of session.    See FIM for current functional status  Therapy/Group: Individual Therapy  Vista Deck 12/03/2014, 10:18 AM

## 2014-12-03 NOTE — Plan of Care (Signed)
Problem: RH BLADDER ELIMINATION Goal: RH STG MANAGE BLADDER WITH EQUIPMENT WITH ASSISTANCE STG Manage Bladder With Equipment With Assistance. Mod I  Outcome: Not Progressing Pt has foley at this time

## 2014-12-03 NOTE — Progress Notes (Signed)
Physical Therapy Session Note  Patient Details  Name: [name] Weber MRN: [id number] Date of Birth: [birth year]  Today's Date: 12/03/2014 PT Individual Time: 0830-0930 PT Individual Time Calculation (min): 60 min   Short Term Goals: Week 1:  PT Short Term Goal 1 (Week 1): Pt will perform bed mobility with Min A using rolling technique. PT Short Term Goal 2 (Week 1): Pt will perform sliding board transfer with Mod A  PT Short Term Goal 3 (Week 1): Pt will perform dynamic sitting balance x5 min with close S PT Short Term Goal 4 (Week 1): Pt will ambualte 46' with assist of 1 and RW PT Short Term Goal 5 (Week 1): Pt will stand x5 min during functional task with min A  Skilled Therapeutic Interventions/Progress Updates:   Pt presents on toilet from OT session. Assisted pt with donning brief, hygiene, and pulling up pants in preparation for transfer back to w/c from elevated toilet seat requiring total A for clothing management and hygiene and mod A for transfer back to w/c. Pt able to work on partial sit to squat to lift up bottom to perform clothing management and hygiene. Rest breaks needed due to fatigue. Self propelled w/c down to therapy gym for functional strengthening and endurance. Gait trial in parallel bars with pt requiring mod A for sit to stand from w/c with cues for technique and hand placement during gait; therapist blocked knees as fatigued slight buckling but able to recover min to mod A (8' forward and 8 feet backwards). Attempted with RW and +2 for close w/c follow but pt unable to maintain standing well enough to initiate a step due to posterior lean, decreased balance, and knees buckling. Discussed using Maxi sky in future session to address gait with RW for safety but due to time restraints unable to attempt this session. Returned back to room and transferred to recliner with min A squat pivot technique with pillows positioned and LEs elevated for comfort and edema control.    Therapy Documentation Precautions:  Precautions Precautions: Fall, Back Precaution Booklet Issued: No Precaution Comments: watch orthostatic BP Restrictions Weight Bearing Restrictions: No   Pain:  c/o back pain on L side today - RN administered pain medication beginning of session. Notified of increased pain by end of session.  See FIM for current functional status  Therapy/Group: Individual Therapy  Karolee Stamps [facility] 12/03/2014, 11:04 AM

## 2014-12-03 NOTE — Progress Notes (Signed)
PMR Admission Coordinator Pre-Admission Assessment  Patient: [name] Weber is an 81 y.o., female MRN: [id number] DOB: 1931-11-25 Height: '5\' 6"'  (167.6 cm) Weight: 59.8 kg (131 lb 13.4 oz)  Insurance Information HMO: PPO: Yes PCP: IPA: 80/20: OTHER: Group # [id number] PRIMARY: A[employer] Medicare complete Policy#: [id number] Subscriber: [name] Weber CM Name: Geryl Rankins Phone#: [phone] Fax#:  Pre-Cert#: [id number] Employer: Not employed Benefits: Phone #: [phone] Name: Automated Eff. Date: 08/30/14 Deduct: $0 Out of Pocket Max: $4500 (met $559.68) Life Max: unlimited CIR: $345 days 1-5 SNF: $0 days 1-20; $155 days 21-50; $0 days 51-100 Outpatient: medical necessity Co-Pay: $40/visit per therapy Home Health: 100% Co-Pay: none DME: 80% Co-Pay: 20% Providers: in network  Emergency Contact Information Contact Information    Name Relation Home Work Mobile   [place] Son   [phone]   [place],Fay Other   [phone]   Johnnette Litter Sister [phone]     [place] Niece [phone]     Myrlene Broker   [phone]   [place],Via Friend   [phone]     Current Medical History  Patient Admitting Diagnosis: 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 female with a history of PAF on coumadin, HLD and no other real significant medical history who presented to 11/16/14 with new low back pain with weakness and inability to walk. MRI of spine with abnormal cord signal due to hemorrhage/hematoma in the ventral spinal canal T 10- T 12 causing spinal chord compression. Her coumadin was emergently reversed and patient underwent thoracic laminectomy T 11 -T 12  decompression of epidural space and evacuation of SDH by Dr. Joya Salm the same day. Post op with A flutter and cardiology assisting with rate control and BP management. he had transient improvement in BLE weakness and follow up CT lumbar/thoracis spine reveals post op changes with with hyperdense blood products in lower lumbar spine. She developed hypotension requiring florinef and cardizem changed to digoxin to help with symptoms. She continues to have orthostatic changes affecting mobility and IVF added for support. Follow up X rays of abdomen and spine ordered due to ongoing pain issues. CCS consulted for input on abdominal pain and CT abdomen/pelvis ordered for workup and revealed RLL PE as well as prominent pancreatic duct as well as possible ileus. Patient was transferred to ICU for closer monitoring and BLE dopplers with mobile DVT in CFV and in gastrocnemius as well as L-DVT in soleus vein. IVC filter placed by radiology on 12/29 and patient cleared to start ASA for stroke prevention per Dr. Joya Salm. Patient with resultant paraparesis as well as bowel/bladder incontinence. MD, family and rehab team requesting CIR for progressive therapy. Family now indicating ability to provide 24/7 care past discharge.    Past Medical History  Past Medical History  Diagnosis Date  . Paroxysmal atrial flutter   . Palpitations     OCCASSIONAL  . PAC (premature atrial contraction)     ISOLATED  . PVC's (premature ventricular contractions)     ISOLATED  . Malaise   . Fatigue   . Myalgia     Family History  family history includes Hyperlipidemia in her mother.  Prior Rehab/Hospitalizations: No previous rehab.  Current Medications  Current facility-administered medications: 0.9 % sodium chloride infusion, , Intravenous, Continuous, Lelon Perla, MD, Last Rate: 100 mL/hr at 11/29/14 1100; acetaminophen (TYLENOL) tablet 650 mg, 650 mg, Oral, Q4H PRN, 650  mg at 11/20/14 1621 **OR** acetaminophen (TYLENOL) suppository 650 mg, 650 mg, Rectal, Q4H PRN,  Floyce Stakes, MD alum & mag hydroxide-simeth (MAALOX/MYLANTA) 200-200-20 MG/5ML suspension 30 mL, 30 mL, Oral, Q6H PRN, Floyce Stakes, MD; aspirin EC tablet 81 mg, 81 mg, Oral, Daily, Floyce Stakes, MD, 81 mg at 11/29/14 1045; atorvastatin (LIPITOR) tablet 10 mg, 10 mg, Oral, Daily, Floyce Stakes, MD, 10 mg at 11/29/14 1045; bisacodyl (DULCOLAX) suppository 10 mg, 10 mg, Rectal, q morning - 10a, Floyce Stakes, MD, Stopped at 11/29/14 1045 diazepam (VALIUM) tablet 5 mg, 5 mg, Oral, Q6H PRN, Floyce Stakes, MD, 5 mg at 11/27/14 2013; digoxin (LANOXIN) tablet 0.125 mg, 0.125 mg, Oral, Daily, Carlena Bjornstad, MD, 0.125 mg at 11/29/14 1045; diphenhydrAMINE (BENADRYL) capsule 25 mg, 25 mg, Oral, Q4H PRN, [name] Miss, MD, 25 mg at 11/18/14 0008; diphenhydrAMINE (BENADRYL) injection 12.5 mg, 12.5 mg, Intravenous, Q6H PRN, [name] Miss, MD, 12.5 mg at 11/18/14 0750 feeding supplement (ENSURE COMPLETE) (ENSURE COMPLETE) liquid 237 mL, 237 mL, Oral, Q24H, Baird Lyons, RD, 237 mL at 11/28/14 2046; fludrocortisone (FLORINEF) tablet 0.1 mg, 0.1 mg, Oral, Daily, Carlena Bjornstad, MD, 0.1 mg at 11/29/14 1045; menthol-cetylpyridinium (CEPACOL) lozenge 3 mg, 1 lozenge, Oral, PRN **OR** phenol (CHLORASEPTIC) mouth spray 1 spray, 1 spray, Mouth/Throat, PRN, Floyce Stakes, MD morphine 2 MG/ML injection 1-4 mg, 1-4 mg, Intravenous, Q3H PRN, Floyce Stakes, MD, 1 mg at 11/29/14 7062; multivitamin with minerals tablet 1 tablet, 1 tablet, Oral, Daily, Baird Lyons, RD, 1 tablet at 11/29/14 1045; ondansetron [facility]) injection 4 mg, 4 mg, Intravenous, Q4H PRN, Floyce Stakes, MD, 4 mg at 11/26/14 0430 oxyCODONE-acetaminophen (PERCOCET/ROXICET) 5-325 MG per tablet 1-2 tablet, 1-2 tablet, Oral, Q4H PRN, Floyce Stakes, MD, 1 tablet at 11/29/14 0602; promethazine (PHENERGAN) injection 12.5 mg, 12.5  mg, Intravenous, Q4H PRN, [name] Miss, MD; sodium chloride 0.9 % injection 3 mL, 3 mL, Intravenous, Q12H, Floyce Stakes, MD, 3 mL at 11/29/14 0334 sodium chloride 0.9 % injection 3 mL, 3 mL, Intravenous, PRN, Floyce Stakes, MD, 3 mL at 11/19/14 1042; sodium chloride tablet 1 g, 1 g, Oral, TID WC, Consuella Lose, MD, 1 g at 11/29/14 0845; sodium phosphate (FLEET) 7-19 GM/118ML enema 1 enema, 1 enema, Rectal, Daily PRN, Floyce Stakes, MD, 1 enema at 11/29/14 3762; tamsulosin [facility]) capsule 0.8 mg, 0.8 mg, Oral, Daily, Floyce Stakes, MD, 0.8 mg at 11/29/14 1045  Patients Current Diet: Diet regular  Precautions / Restrictions Precautions Precautions: Fall, Back Precaution Booklet Issued: No Precaution Comments: watch orthostatic BP Restrictions Weight Bearing Restrictions: No   Prior Activity Level Community (5-7x/wk): Went out daily. Was a Insurance underwriter. Walked 5 miles per day. Wears a FitBit.   Home Assistive Devices / Equipment Home Assistive Devices/Equipment: Eyeglasses, Communication device (specify type) (hearing aids)  Prior Functional Level Prior Function Level of Independence: Independent  Current Functional Level Cognition  Overall Cognitive Status: Impaired/Different from baseline Current Attention Level: Focused Orientation Level: Oriented X4 Following Commands: Follows one step commands inconsistently General Comments: Pt reports OT helped patient to chair on sunday 11/25/14. Ot reports to patient no we havent worked together since last week. Pt states "oh it was this other girl then " and points to rehab tech present. Pt unable to report accurately to sister food that was eaten or events that occurred. Pt with incr confusion compared to previous session with OT. Pt also with lack of awareness to bowel and bladder uncontrolled   Extremity Assessment (includes Sensation/Coordination)  Upper Extremity Assessment: Defer to  OT evaluation Lower  Extremity Assessment: RLE deficits/detail;LLE deficits/detail RLE Deficits / Details: Hip strength grossly 4-/5, knee and ankle strength grossly 3/5. pt with decreased coordination.  LLE Deficits / Details: hip strength grossly 4-/5, knee/ankle strength grossly 3/5. Decreased coordination.  Cervical / Trunk Assessment: Normal    ADLs  Overall ADL's : Needs assistance/impaired Grooming: Oral care, Wash/dry face, Brushing hair, Sitting, Set up Grooming Details (indicate cue type and reason): supported sitting Toilet Transfer: +2 for physical assistance, Moderate assistance Toilet Transfer Details (indicate cue type and reason): required ace wraps on bil LE and SCD applied to due orthostatic BP Toileting- Clothing Manipulation and Hygiene: Total assistance, Maximal assistance Toileting - Clothing Manipulation Details (indicate cue type and reason): pt without awareness of BM Functional mobility during ADLs: Moderate assistance, +2 for physical assistance, Rolling walker General ADL Comments: Pt total (A) for peri care due to incontinence of bowel / bladder. Pt with new onset of bowel/ bladder starting previous week and has incr. OT unable to obtain orthostatic BP due to incontinence. Pt reports pain limiting all mobility. pt unabel to tolerate Eob sitting due to pain without support. Sister present and very concerned with pending d/c options due to phone call from [place] [facility] stating pending arrival to their facilty today. Sister does not want patient to d/c to [facility] place and prefers inpatient rehab. CM [name] Weber arriving and discussing with sister POA [name] Weber during end of session.    Mobility  Overal bed mobility: Needs Assistance, +2 for physical assistance Bed Mobility: Rolling, Sidelying to Sit Rolling: Min assist Sidelying to sit: Max assist, +2 for physical assistance Supine to sit: Max assist Sit to supine: +2 for physical assistance, Max assist General bed mobility comments:  cues for log roll technique and sequencing bed mobility.     Transfers  Overall transfer level: Needs assistance Equipment used: 2 person hand held assist Transfers: Sit to/from Stand, Stand Pivot Transfers Sit to Stand: Max assist, +2 physical assistance Stand pivot transfers: Max assist, +2 physical assistance General transfer comment: cues for UE use and positioning of LEs. pt unable to achieve full standing and leans her head on therapy staff. pt incontinent of bowels in standing and required multiple stands for hygiene.     Ambulation / Gait / Stairs / Wheelchair Mobility  Ambulation/Gait Ambulation/Gait assistance: Mod assist, +2 safety/equipment Ambulation Distance (Feet): 50 Feet (10) Assistive device: Rolling walker (2 wheeled) Gait Pattern/deviations: Step-to pattern, Ataxic, Decreased weight shift to right, Decreased weight shift to left Gait velocity: decreased General Gait Details: unable due to BP    Posture / Balance Dynamic Sitting Balance Sitting balance - Comments: pt requires support to stay EOB    Special needs/care consideration BiPAP/CPAP No CPM No Continuous Drip IV 0.9% NS 100 ml/hr Dialysis No  Life Vest No Oxygen No Special Bed No Trach Size No Wound Vac (area) No  Skin Dressing to coccyx for protection  Bowel mgmt: Last BM 11/28/14 Bladder mgmt: Urinary catheter in place Diabetic mgmt No    Previous Home Environment Living Arrangements: Alone Home Care Services: No  Discharge Living Setting Plans for Discharge Living Setting: Patient's home, Other (Comment) (Lives in a condominium on the second level.) Type of Home at Discharge: Apartment Discharge Home Layout: One level Discharge Home Access: Stairs to enter Entrance Stairs-Number of Steps: 14 steps to entrance of condo, then all on one level. Does the patient have any problems obtaining your medications?: No  Social/Family/Support  Systems Patient Roles: Parent (  Has a son, dtr-in-law and a niece.) Contact Information: [name] Weber - [phone] Anticipated Caregiver: [name] Weber - dtr-in-law Anticipated Caregiver's Contact Information: [name] Weber [phone] Ability/Limitations of Caregiver: Dtr-in-law plans to go stay with patient and care for her after rehab stay Caregiver Availability: 24/7 Discharge Plan Discussed with Primary Caregiver: Yes Is Caregiver In Agreement with Plan?: Yes Does Caregiver/Family have Issues with Lodging/Transportation while Pt is in Rehab?: No  Goals/Additional Needs Patient/Family Goal for Rehab: PT mod I, OT mod I/Supervision goals Expected length of stay: 15-20 days Cultural Considerations: None Dietary Needs: Regular diet, thin liquids Equipment Needs: TBD Pt/Family Agrees to Admission and willing to participate: Yes Program Orientation Provided & Reviewed with Pt/Caregiver Including Roles & Responsibilities: Yes  Decrease burden of Care through IP rehab admission: N/A  Possible need for SNF placement upon discharge: Not planned  Patient Condition: This patient's medical and functional status has changed since the consult dated: 11/27/14 in which the Rehabilitation Physician determined and documented that the patient's condition is appropriate for intensive rehabilitative care in an inpatient rehabilitation facility. See "History of Present Illness" (above) for medical update. Functional changes are: Currently requiring max assist +2 for stand pivot transfers. Patient's medical and functional status update has been discussed with the Rehabilitation physician and patient remains appropriate for inpatient rehabilitation. Will admit to inpatient rehab today.  Preadmission Screen Completed By: [name], 11/29/2014 12:03 PM ______________________________________________________________________  Discussed status with Dr. Naaman Plummer on 11/29/14 at 1049 and received telephone approval  for admission today.  Admission Coordinator: [name], time1204/Date12/31/15          Cosigned by: Meredith Staggers, MD at 11/29/2014 12:15 PM

## 2014-12-03 NOTE — Progress Notes (Signed)
Expand All Collapse All        Physical Medicine and Rehabilitation Consult  Reason for Consult: Thoracic hematoma with paraparesis.  Referring Physician: Dr. [name].    HPI: [name] Weber is a 81 y.o. female with a history of PAF on coumadin, HLD and no other real significant medical history who presented to 11/16/14 with new low back pain with weakness and inability to walk. MRI of spine with abnormal cord signal due to hemorrhage/hematoma in the ventral spinal canal T 10- T 12 causing spinal chord compression. Her coumadin was emergently reversed and patient underwent thoracic laminectomy T 11 -T 12 decompression of epidural space and evacuation of SDH by Dr. [name] the same day. Post op with A flutter and cardiology assisting with rate control and BP management. he had transient improvement in BLE weakness and follow up CT lumbar/thoracis spine reveals post op changes with with hyperdense blood products in lower lumbar spine. She developed hypotension requiring florinef and cardizem changed to digoxin to help with symptoms. She continues to have orthostatic changes affecting mobility and IVF added for support. Follow up X rays of abdomen and spine ordered due to ongoing pain issues. CCS consulted for input on abdominal pain and CT abdomen/pelvis ordered for workup and revealed RLL PE as well as prominent pancreatic duct as well as possible ileus. Patient was transferred to ICU last pm for closer monitoring and currently on bedrest. BLE dopplers pending. Patient with resultant paraparesis as well as bowel/bladder incontinence. MD, family and rehab team requesting CIR for progressive therapy. Family now indicating ability to provide 24/7 care past discharge.    Review of Systems  HENT: Positive for hearing loss.  Eyes: Negative for blurred vision and double vision.  Respiratory: Positive for shortness of breath. Negative for cough.  Cardiovascular: Negative for chest pain and  palpitations.  Gastrointestinal: Positive for nausea and abdominal pain (RLQ pain). Negative for heartburn and vomiting.   Right lower abdominal pain  Musculoskeletal: Positive for back pain (with activity).  Neurological: Positive for dizziness (with activity). Negative for headaches.      Past Medical History  Diagnosis Date  . Paroxysmal atrial flutter   . Palpitations     OCCASSIONAL  . PAC (premature atrial contraction)     ISOLATED  . PVC's (premature ventricular contractions)     ISOLATED  . Malaise   . Fatigue   . Myalgia     Past Surgical History  Procedure Laterality Date  . Vulvar lesion removal  01/01/2009    She had a 1-cm area of erythema to the right of the urethral meatus  . Thoracic laminectomy for epidural abscess N/A 11/16/2014    Procedure: THORACIC LAMINECTOMY FOR EPIDURAL ABSCESS; Surgeon: Karn Cassis, MD; Location: [facility]; Service: Neurosurgery; Laterality: N/A;    Family History  Problem Relation Age of Onset  . Hyperlipidemia Mother     Social History: Lives alone. Independent and active PTA. Reports she walks 5-6 miles 3-4 times/daily. she reports that she quit smoking about 23 years ago. She does not have any smokeless tobacco history on file. She reports that she does not drink alcohol or use illicit drugs.    Allergies  Allergen Reactions  . Crestor [Rosuvastatin Calcium]    Medications Prior to Admission  Medication Sig Dispense Refill  . alendronate (FOSAMAX) 35 MG tablet Take 35 mg by mouth every 7 (seven) days. Take with a full glass of water on an empty stomach. Sunday    .  atorvastatin (LIPITOR) 10 MG tablet Take 10 mg by mouth daily.    . Calcium Carbonate-Vitamin D (CALCIUM + D PO) Take by mouth 2 (two) times daily.     . Coenzyme Q10 (COQ10 PO) Take by mouth daily.    . metoprolol succinate (TOPROL XL) 25 MG 24  hr tablet Take 0.5 tablets (12.5 mg total) by mouth daily. 90 tablet 3  . Omega-3 Fatty Acids (FISH OIL PO) Take 2,000 mg by mouth 2 (two) times daily.     . predniSONE (DELTASONE) 20 MG tablet Take 2 tablets (40 mg total) by mouth daily. 10 tablet 0  . ramipril (ALTACE) 2.5 MG capsule Take 1 capsule (2.5 mg total) by mouth daily. 90 capsule 3  . warfarin (COUMADIN) 5 MG tablet Take as directed by coumadin clinic 90 tablet 2  . amoxicillin (AMOXIL) 500 MG capsule as needed. Prior to dental visit      Home: Home Living Family/patient expects to be discharged to:: Skilled nursing facility Living Arrangements: Alone  Functional History: Prior Function Level of Independence: Independent Functional Status:  Mobility: Bed Mobility Overal bed mobility: Needs Assistance, +2 for physical assistance Bed Mobility: Supine to Sit, Sit to Supine Rolling: Mod assist Sidelying to sit: Max assist Supine to sit: Max assist Sit to supine: +2 for physical assistance, Max assist General bed mobility comments: Pt asked to [facility] your knee. Pt attempting single leg raise. pt provided tactile cues and unable to follow command initially. Pt with cognitive deficits with task. Pt with no recall of sequence to exit bed safely with back precautions Transfers Overall transfer level: Needs assistance Equipment used: 2 person hand held assist Transfers: Sit to/from Stand, Stand Pivot Transfers Sit to Stand: +2 physical assistance, Min assist Stand pivot transfers: Min assist, +2 physical assistance General transfer comment: unable to complete more than a sit<>Stand squat due to incontinence and weakness. pt crying out with pain Ambulation/Gait Ambulation/Gait assistance: Mod assist, +2 safety/equipment Ambulation Distance (Feet): 50 Feet (10) Assistive device: Rolling walker (2 wheeled) Gait Pattern/deviations: Step-to pattern, Ataxic, Decreased weight shift to right, Decreased  weight shift to left Gait velocity: decreased General Gait Details: unable due to BP    ADL: ADL Overall ADL's : Needs assistance/impaired Toilet Transfer: +2 for physical assistance, Moderate assistance Toilet Transfer Details (indicate cue type and reason): required ace wraps on bil LE and SCD applied to due orthostatic BP Toileting- Clothing Manipulation and Hygiene: Total assistance (total (A) for peri care and mod (A) for static standing ) Toileting - Clothing Manipulation Details (indicate cue type and reason): Pt static standing with OT and tech completing peri care. pt orthostatic and required return to sitting with ankle pumps, SCD and ace wraps prior to return to chair  Functional mobility during ADLs: Moderate assistance, +2 for physical assistance, Rolling walker General ADL Comments: Pt total (A) for peri care due to incontinence of bowel / bladder. Pt with new onset of bowel/ bladder starting previous week and has incr. OT unable to obtain orthostatic BP due to incontinence. Pt reports pain limiting all mobility. pt unabel to tolerate Eob sitting due to pain without support. Sister present and very concerned with pending d/c options due to phone call from [facility] stating pending arrival to their facilty today. Sister does not want patient to d/c to [facility] and prefers inpatient rehab. CM Steward Drone arriving and discussing with sister POA Aurea Graff during end of session.  Cognition: Cognition Overall Cognitive Status: Impaired/Different from baseline Orientation  Level: Oriented X4 Cognition Arousal/Alertness: Awake/alert Behavior During Therapy: WFL for tasks assessed/performed Overall Cognitive Status: Impaired/Different from baseline Area of Impairment: Attention, Memory, Following commands, Awareness Current Attention Level: Focused Memory: Decreased short-term memory Following Commands: Follows one step commands inconsistently Awareness: Emergent Problem Solving: Slow  processing General Comments: Pt reports OT helped patient to chair on sunday 11/25/14. Ot reports to patient no we havent worked together since last week. Pt states "oh it was this other girl then " and points to rehab tech present. Pt unable to report accurately to sister food that was eaten or events that occurred. Pt with incr confusion compared to previous session with OT. Pt also with lack of awareness to bowel and bladder uncontrolled  Blood pressure 121/67, pulse 79, temperature 98.4 F (36.9 C), temperature source Oral, resp. rate 15, height  (1.676 m), weight 59.8 kg (131 lb 13.4 oz), SpO2 98 %. Physical Exam  Nursing note and vitals reviewed. Constitutional: She is oriented to person, place, and time. She appears well-developed. She appears cachectic.  HENT:  Head: Normocephalic and atraumatic.  Eyes: Conjunctivae are normal. Pupils are equal, round, and reactive to light.  Neck: Normal range of motion. Neck supple.  Cardiovascular: Normal rate.  Respiratory: Effort normal and breath sounds normal. No respiratory distress. She has no wheezes.  GI: Soft. Bowel sounds are normal. She exhibits no distension.  Mild fullness noted RLQ with some tenderness.  Musculoskeletal: She exhibits no edema or tenderness.  Neurological: She is alert and oriented to person, place, and time.  Speech soft but clear. Follows commands without difficulty. BLE with proximal weakness--R > LLE as well as sensory deficits, particularly in anterior thighs. ?mild loss of proprioception/LT in both legs. Strength: 3/5HF,KE and 4- ADF/APF. UE's 5/5.  Skin: Skin is warm and dry.     Lab Results Last 24 Hours    Results for orders placed or performed during the hospital encounter of 11/16/14 (from the past 24 hour(s))  Basic metabolic panel Status: Abnormal   Collection Time: 11/26/14 10:15 AM  Result Value Ref Range   Sodium 131 (L) 135 - 145 mmol/L   Potassium 3.8 3.5 - 5.1 mmol/L    Chloride 99 96 - 112 mEq/L   CO2 27 19 - 32 mmol/L   Glucose, Bld 133 (H) 70 - 99 mg/dL   BUN 12 6 - 23 mg/dL   Creatinine, Ser 1.61 0.50 - 1.10 mg/dL   Calcium 8.0 (L) 8.4 - 10.5 mg/dL   GFR calc non Af Amer 87 (L) >90 mL/min   GFR calc Af Amer >90 >90 mL/min   Anion gap 5 5 - 15  CBC Status: Abnormal   Collection Time: 11/26/14 10:15 AM  Result Value Ref Range   WBC 5.9 4.0 - 10.5 K/uL   RBC 3.56 (L) 3.87 - 5.11 MIL/uL   Hemoglobin 11.7 (L) 12.0 - 15.0 g/dL   HCT 09.6 (L) 04.5 - 40.9 %   MCV 96.6 78.0 - 100.0 fL   MCH 32.9 26.0 - 34.0 pg   MCHC 34.0 30.0 - 36.0 g/dL   RDW 81.1 91.4 - 78.2 %   Platelets 193 150 - 400 K/uL  Differential Status: Abnormal   Collection Time: 11/26/14 10:15 AM  Result Value Ref Range   Neutrophils Relative % 72 43 - 77 %   Neutro Abs 4.3 1.7 - 7.7 K/uL   Lymphocytes Relative 11 (L) 12 - 46 %   Lymphs Abs 0.6 (L) 0.7 -  4.0 K/uL   Monocytes Relative 16 (H) 3 - 12 %   Monocytes Absolute 0.9 0.1 - 1.0 K/uL   Eosinophils Relative 1 0 - 5 %   Eosinophils Absolute 0.1 0.0 - 0.7 K/uL   Basophils Relative 0 0 - 1 %   Basophils Absolute 0.0 0.0 - 0.1 K/uL  MRSA PCR Screening Status: None   Collection Time: 11/26/14 10:43 PM  Result Value Ref Range   MRSA by PCR NEGATIVE NEGATIVE      Imaging Results (Last 48 hours)    Dg Thoracic Spine 2 View  11/26/2014 CLINICAL DATA: Mid back pain aggravated by movement. EXAM: THORACIC SPINE - 2 VIEW COMPARISON: November 13, 2014. FINDINGS: There is no evidence of thoracic spine fracture. Alignment is normal. Mild degenerative changes are noted in the lower thoracic spine. IMPRESSION: No acute abnormality seen in the thoracic spine. Electronically Signed By: Roque Lias M.D. On: 11/26/2014 14:22   Dg Lumbar Spine 2-3 Views  11/26/2014 CLINICAL DATA:  Mid and lower back pain. EXAM: LUMBAR SPINE - 2-3 VIEW COMPARISON: 11/18/2014 FINDINGS: Midline dorsal surgical staple line. Multilevel degenerative change of the visualized lumbar spine. There is grade 1 anterolisthesis of L5 on S1. Multilevel facet degenerative change most pronounced at L3-4, L4-5 and L5-S1. Aortic vascular calcifications. SI joints are unremarkable. IMPRESSION: Multilevel degenerative change of the lumbar spine. Grade 1 anterolisthesis L5-S1. Electronically Signed By: Annia Belt M.D. On: 11/26/2014 14:24   Ct Abdomen Pelvis W Contrast  11/26/2014 CLINICAL DATA: Patient with history of recent back surgery. Right lower quadrant pain. EXAM: CT ABDOMEN AND PELVIS WITH CONTRAST TECHNIQUE: Multidetector CT imaging of the abdomen and pelvis was performed using the standard protocol following bolus administration of intravenous contrast. CONTRAST: OMNIPAQUE IOHEXOL 300 MG/ML SOLN COMPARISON: 11/16/2014 FINDINGS: Lower chest: Small bilateral pleural effusions. Normal heart size. Consolidative opacities within the bilateral lower lobes, suggestive of atelectasis. Additionally there is a central filling defect within a right lower lobe pulmonary artery (image 4; series 201), compatible with pulmonary embolism. Hepatobiliary: The liver is normal in size and contour without focal hepatic lesion identified. Mild central intrahepatic biliary ductal dilatation. The common bile duct is appropriate for patient's age measuring up to 8 mm. Gallbladder is unremarkable. Pancreas: The pancreatic duct measures up to 3 mm. The pancreatic parenchyma is grossly unremarkable. Spleen: Unremarkable Adrenals/Urinary Tract: Normal adrenal glands. Kidneys enhance symmetrically with contrast. Exophytic simple cyst off the inferior pole of the left kidney. No hydronephrosis. Urinary bladder is markedly distended. Stomach/Bowel: Stool present throughout the colon as can be seen with  constipation. Sigmoid colonic diverticulosis without evidence for acute diverticulitis. Normal appendix. No evidence for small bowel obstruction. Mild gaseous distended loops of small bowel, potentially representing postoperative ileus. Vascular/Lymphatic: Scattered calcified atherosclerotic plaque involving the abdominal aorta. No retroperitoneal lymphadenopathy. Other: Perihepatic ascites. No free intraperitoneal air. Anasarca. Musculoskeletal: Postoperative changes involving the posterior elements of the T11, T12 and L1 vertebral bodies. No significant residual gas demonstrated within the spinal canal at these levels. There is fluid demonstrated within the overlying soft tissues. There is an overlying surgical staple line. Suggestion of interval decrease in previously described high attenuation acute blood products within the spinal canal. IMPRESSION: 1. Findings compatible with acute pulmonary embolism within the right lower lobe pulmonary artery. 2. Postoperative changes from the T11 through L1 vertebral bodies. There is associated fluid within the overlying soft tissues, likely postoperative in etiology. Superimposed infection not excluded. 3. Anasarca and small amount of ascites. 4. Marked  urinary bladder dilatation. Consider decompression with Foley catheter. 5. Stool throughout the colon as can be seen with constipation. Possible postoperative ileus. 6. Prominent pancreatic duct measuring up to 3 mm. Mild central intrahepatic biliary ductal dilatation. Recommend correlation with laboratory analysis. Critical Value/emergent results were called by telephone at the time of interpretation on 11/26/2014 at 7:59 pm to Dr. Dutch Quint, who verbally acknowledged these results. Electronically Signed By: Annia Belt M.D. 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 Does the need for close, 24 hr/day medical supervision in concert with the patient's  rehab needs make it unreasonable for this patient to be served in a less intensive setting? Yes 2. Co-Morbidities requiring supervision/potential complications: PE, afib/flutter 3. Due to bladder management, bowel management, safety, skin/wound care, disease management, medication administration, pain management and patient education, does the patient require 24 hr/day rehab nursing? Yes 4. Does the patient require coordinated care of a physician, rehab nurse, PT (1-2 hrs/day, 5 days/week) and OT (1-2 hrs/day, 5 days/week) to address physical and functional deficits in the context of the above medical diagnosis(es)? Yes Addressing deficits in the following areas: balance, endurance, locomotion, strength, transferring, bowel/bladder control, bathing, dressing, feeding, grooming, toileting and psychosocial support 5. Can the patient actively participate in an intensive therapy program of at least 3 hrs of therapy per day at least 5 days per week? Yes 6. The potential for patient to make measurable gains while on inpatient rehab is excellent 7. Anticipated functional outcomes upon discharge from inpatient rehab are modified independent with PT, modified independent and supervision with OT, n/a with SLP. 8. Estimated rehab length of stay to reach the above functional goals is: 15-20 days 9. Does the patient have adequate social supports and living environment to accommodate these discharge functional goals? Yes 10. Anticipated D/C setting: Home 11. Anticipated post D/C treatments: HH therapy and Outpatient therapy 12. Overall Rehab/Functional Prognosis: excellent  RECOMMENDATIONS: This patient's condition is appropriate for continued rehabilitative care in the following setting: CIR Patient has agreed to participate in recommended program. Yes Note that insurance prior authorization may be required for reimbursement for recommended care.  Comment: Pt is EXTREMELY motivated and was EXTREMELY active  prior to admission. Will excel in our inpatient program. Appears to have supportive network who can assist once at home as well. Rehab Admissions Coordinator to follow up.  Thanks,  Ranelle Oyster, MD, [place] [facility]     11/27/2014

## 2014-12-03 NOTE — Progress Notes (Signed)
[facility]ILIT[facility]     PROGRESS NOTE    Subjective/Complaints: Tolerated therapies yesterday. Has a "knot" over right chest--doesn't really hurt, pulls a bit. A  review of systems has been performed and if not noted above is otherwise negative.   Objective: Vital Signs: Blood pressure 125/64, pulse 73, temperature 98.5 F (36.9 C), temperature source Oral, resp. rate 18, SpO2 100 %. No results found. No results for input(s): WBC, HGB, HCT, PLT in the last 72 hours.  Recent Labs  12/03/14 0500  NA 135  K 3.9  CL 100  GLUCOSE 96  BUN 5*  CREATININE 0.55  CALCIUM 8.2*   CBG (last 3)  No results for input(s): GLUCAP in the last 72 hours.  Wt Readings from Last 3 Encounters:  11/17/14 59.8 kg (131 lb 13.4 oz)  11/13/14 48.535 kg (107 lb)  07/16/14 48.988 kg (108 lb)    Physical Exam:  Constitutional: She is oriented to person, place, and time. She appears well-developed.  HENT: oral mucosa moist, dentition good Head: Normocephalic and atraumatic.  Eyes: Conjunctivae are normal. Pupils are equal, round, and reactive to light.  Neck: Normal range of motion. Neck supple.  Cardiovascular: Normal rate.  Respiratory: Effort normal and breath sounds normal. No respiratory distress. She has no wheezes.  GI: Soft. Bowel sounds are normal. She exhibits no distension.  Non-tender.  Musculoskeletal: She exhibits no edema or tenderness. No spasms or "knot" on chest wall Neurological: She is alert and oriented to person, place, and time.  Speech clear. Follows commands without difficulty. decreased proprioception/LT in both legs. Strength: 3/5HF,KE bilaterally and 4- ADF/APF. UE's 5/5.  Skin: Skin is warm and dry. Back incision remains clean,dry and intact.  Psych: pleasant and appropriate  Assessment/Plan: 1. Functional deficits secondary to T10-12 hematoma with subsequent myelopathy and paraplegia which require 3+ hours per day of  interdisciplinary therapy in a comprehensive inpatient rehab setting. Physiatrist is providing close team supervision and 24 hour management of active medical problems listed below. Physiatrist and rehab team continue to assess barriers to discharge/monitor patient progress toward functional and medical goals. FIM: FIM - Bathing Bathing Steps Patient Completed: Chest, Right Arm, Left Arm, Abdomen, Front perineal area, Buttocks, Right upper leg, Left upper leg Bathing: 4: Min-Patient completes 8-9 84f 10 parts or 75+ percent  FIM - Upper Body Dressing/Undressing Upper body dressing/undressing steps patient completed: Thread/unthread right bra strap, Thread/unthread left bra strap, Thread/unthread right sleeve of pullover shirt/dresss, Thread/unthread left sleeve of pullover shirt/dress, Put head through opening of pull over shirt/dress, Pull shirt over trunk Upper body dressing/undressing: 4: Min-Patient completed 75 plus % of tasks FIM - Lower Body Dressing/Undressing Lower body dressing/undressing steps patient completed: Thread/unthread left pants leg, Thread/unthread right pants leg, Pull pants up/down, Don/Doff right sock, Don/Doff left sock Lower body dressing/undressing: 1: Total-Patient completed less than 25% of tasks  FIM - Toileting Toileting: 1: Total-Patient completed zero steps, helper did all 3 (supine rolling in bed)  FIM - Archivist Transfers: 0-Activity did not occur  FIM - Banker Devices: Arm rests Bed/Chair Transfer: 4: Supine > Sit: Min A (steadying Pt. > 75%/lift 1 leg), 4: Sit > Supine: Min A (steadying pt. > 75%/lift 1 leg), 4: Bed > Chair or W/C: Min A (steadying Pt. > 75%), 3: Bed > Chair or W/C: Mod A (lift or lower assist), 4: Chair or W/C > Bed: Min A (steadying Pt. > 75%)  FIM - Locomotion:  Wheelchair Distance: 150 Locomotion: Wheelchair: 5: Travels 150 ft or more: maneuvers on rugs and over door sills with  supervision, cueing or coaxing FIM - Locomotion: Ambulation Locomotion: Ambulation Assistive Devices: Designer, industrial/product Ambulation/Gait Assistance: 1: +2 Total assist Locomotion: Ambulation: 1: Two helpers  Comprehension Comprehension Mode: Auditory Comprehension: 5-Follows basic conversation/direction: With extra time/assistive device  Expression Expression Mode: Verbal Expression: 5-Expresses basic 90% of the time/requires cueing < 10% of the time.  Social Interaction Social Interaction: 6-Interacts appropriately with others with medication or extra time (anti-anxiety, antidepressant).  Problem Solving Problem Solving: 5-Solves basic 90% of the time/requires cueing < 10% of the time  Memory Memory: 5-Recognizes or recalls 90% of the time/requires cueing < 10% of the time  Medical Problem List and Plan: 1. Functional deficits secondary to T 10-12 hematoma with myelopathy and paraplegia 2. PE/BLE DVT /Anticoagulation: Mechanical: Sequential compression devices, below knee Bilateral lower extremities No blood thinner due to thoracic hematoma--verified with Dr. Jordan Likes  3. Pain Management: Continue oxycodone prn.  4. Mood: LCSW to follow for evaluation and support.  5. Neuropsych: This patient is not quite capable of making decisions on her own behalf. 6. Skin/Wound Care:  7. Fluids/Electrolytes/Nutrition: Monitor I/O.  Intake improving with family bring in food.    -supps  8. E coli UTI: suprax for 7 days 9. Neurogenic bowel: large bm yesterday  -regular bowel regimen 10. Neurogenic bladder: dc foley for  and start voiding trial   11. Hyponatremia: Slowly improving. Stopped NaCl tabs---normal 135  12. Hypotension: Will continue florinef. Changed flomax to bedtime to avoid drop during the day. continue abdominal binder and TEDs to help with BP support.  13. Afib: ON ASA for stroke prevention. 14. Hypokalemia:  Follow up labs, supplement   LOS (Days) 4 A FACE TO  FACE EVALUATION WAS PERFORMED  SWARTZ,ZACHARY T 12/03/2014 7:32 AM

## 2014-12-03 NOTE — Progress Notes (Signed)
Recreational Therapy Assessment and Plan  Patient Details  Name: [name] Weber MRN: [id number] Date of Birth: 1931/06/18 Today's Date: 12/03/2014  Rehab Potential: Good ELOS: 3 weeks  Assessment Clinical Impression: Problem List:  Patient Active Problem List   Diagnosis Date Noted  . Neurogenic bladder 11/30/2014  . Neurogenic bowel 11/30/2014  . E. coli UTI 11/30/2014  . Thoracic myelopathy 11/29/2014  . Persistent atrial fibrillation 11/27/2014  . Traumatic spinal subdural hematoma   . Acute pulmonary embolism   . Orthostatic hypotension 11/22/2014  . Near syncope 11/21/2014  . Paraplegia at T9 level 11/17/2014  . Weakness of both legs   . Nocturnal leg cramps 07/16/2014  . Encounter for therapeutic drug monitoring 12/25/2013  . Malaise and fatigue 06/01/2011  . Atrial flutter 03/10/2011  . Encounter for long-term (current) use of anticoagulants 03/10/2011  . Benign hypertensive heart disease without heart failure 03/10/2011  . Hypercholesterolemia 03/10/2011  . Paroxysmal atrial flutter   . Palpitations   . PAC (premature atrial contraction)   . PVC's (premature ventricular contractions)   . Malaise   . Fatigue   . Myalgia     Past Medical History:  Past Medical History  Diagnosis Date  . Paroxysmal atrial flutter   . Palpitations     OCCASSIONAL  . PAC (premature atrial contraction)     ISOLATED  . PVC's (premature ventricular contractions)     ISOLATED  . Malaise   . Fatigue   . Myalgia    Past Surgical History:  Past Surgical History  Procedure Laterality Date  . Vulvar lesion removal  01/01/2009    She had a 1-cm area of erythema to the right of the urethral meatus  . Thoracic laminectomy for epidural abscess N/A 11/16/2014    Procedure: THORACIC LAMINECTOMY FOR EPIDURAL ABSCESS; Surgeon: Floyce Stakes, MD;  Location: [facility]; Service: Neurosurgery; Laterality: N/A;    Assessment & Plan Clinical Impression: [name] Weber is a 81 y.o. female with a history of PAF on coumadin, HLD and no other real significant medical history who presented to 11/16/14 with new low back pain with weakness and inability to walk. MRI of spine with abnormal cord signal due to hemorrhage/hematoma in the ventral spinal canal T 10- T 12 causing spinal chord compression. Her coumadin was emergently reversed and patient underwent thoracic laminectomy T 11 -T 12 decompression of epidural space and evacuation of SDH by Dr. Joya Salm the same day. Post op with A flutter and cardiology assisting with rate control and BP management. he had transient improvement in BLE weakness and follow up CT lumbar/thoracis spine reveals post op changes with with hyperdense blood products in lower lumbar spine. She developed hypotension requiring florinef for BP support and cardizem changed to digoxin to help with A flutter. She continues to have orthostatic changes affecting mobility and IVF was added for support. CCS consulted for input on ongoing RLQ/abdominal pain and CT abdomen/pelvis ordered for workup and revealed RLL PE as well as prominent pancreatic duct, markedly distension bladder and stool throughout the colon with possible ileus. Patient was transferred to ICU for closer monitoring and BLE dopplers with mobile DVT in CFV and in gastrocnemius as well as L-DVT in soleus vein. IVC filter placed by radiology on 12/29 and patient cleared to start ASA for stroke prevention per Dr. Joya Salm.  Patient with resultant paraparesis as well as bowel/bladder incontinence. MD, family and rehab team requesting CIR for progressive therapy. Family now indicating ability to provide 24/7  care past discharge.  Patient transferred to CIR on 11/29/2014 .   Pt presents with decreased activity tolerance, decreased functional mobility, decreased balance,  difficulty maintainig precautions Limiting pt's independence with leisure/community pursuits.   Leisure History/Participation Premorbid leisure interest/current participation: Sports - Exercise (Comment);Community Administrator, sports (Comment);Community - Doctor, hospital - Building control surveyor - Travel (Comment) (walking 4-5 miles 4 days a week, pilates; hospice volunteer/[address]) Expression Interests: Music (Comment) Other Leisure Interests: Television;Reading;Cooking/Baking;Housework Leisure Participation Style: With Family/Friends Awareness of Community Resources: Excellent Psychosocial / Spiritual Spiritual Interests: Church Stress Management: Good Patient agreeable to Pet Therapy: Yes Does patient have pets?: Yes (dog) Social interaction - Mood/Behavior: Cooperative Academic librarian Appropriate for Education?: Yes Patient Agreeable to Outing?: Yes Recreational Therapy Orientation Orientation -Reviewed with patient: Available activity resources Strengths/Weaknesses Patient Strengths/Abilities: Willingness to participate;Active premorbidly Patient weaknesses: Physical limitations TR Patient demonstrates impairments in the following area(s): Endurance;Motor;Pain;Safety;Skin Integrity TR Additional Impairment(s): None  Plan Min 1 time per week >20 minutes Recommendations for other services: None  Discharge Criteria: Patient will be discharged from TR if patient refuses treatment 3 consecutive times without medical reason.  If treatment goals not met, if there is a change in medical status, if patient makes no progress towards goals or if patient is discharged from hospital.  The above assessment, treatment plan, treatment alternatives and goals were discussed and mutually agreed upon: by patient  [place] 12/03/2014, 11:55 AM

## 2014-12-04 ENCOUNTER — Encounter (HOSPITAL_COMMUNITY): Payer: Self-pay | Admitting: Occupational Therapy

## 2014-12-04 ENCOUNTER — Inpatient Hospital Stay (HOSPITAL_COMMUNITY): Payer: Self-pay | Admitting: Occupational Therapy

## 2014-12-04 ENCOUNTER — Inpatient Hospital Stay (HOSPITAL_COMMUNITY): Payer: Medicare Other | Admitting: *Deleted

## 2014-12-04 MED ORDER — BACLOFEN 10 MG PO TABS
10.0000 mg | ORAL_TABLET | Freq: Three times a day (TID) | ORAL | Status: DC | PRN
  Administered 2014-12-04 – 2014-12-08 (×5): 10 mg via ORAL
  Filled 2014-12-04 (×7): qty 1

## 2014-12-04 MED ORDER — HYOSCYAMINE SULFATE 0.125 MG PO TABS
0.1250 mg | ORAL_TABLET | ORAL | Status: DC | PRN
  Administered 2014-12-08: 0.125 mg via ORAL
  Filled 2014-12-04 (×2): qty 1

## 2014-12-04 NOTE — Progress Notes (Signed)
[facility]IC[facility] RE[facility]ABILIT[facility]     PROGRESS NOTE    Subjective/Complaints: Complaining of abdominal spasms. Success with bladder emptying so far A  review of systems has been performed and if not noted above is otherwise negative.   Objective: Vital Signs: Blood pressure 133/77, pulse 97, temperature 98.9 F (37.2 C), temperature source Oral, resp. rate 18, SpO2 97 %. No results found. No results for input(s): WBC, HGB, HCT, PLT in the last 72 hours.  Recent Labs  12/03/14 0500  NA 135  K 3.9  CL 100  GLUCOSE 96  BUN 5*  CREATININE 0.55  CALCIUM 8.2*   CBG (last 3)  No results for input(s): GLUCAP in the last 72 hours.  Wt Readings from Last 3 Encounters:  11/17/14 59.8 kg (131 lb 13.4 oz)  11/13/14 48.535 kg (107 lb)  07/16/14 48.988 kg (108 lb)    Physical Exam:  Constitutional: She is oriented to person, place, and time. She appears well-developed.  HENT: oral mucosa moist, dentition good Head: Normocephalic and atraumatic.  Eyes: Conjunctivae are normal. Pupils are equal, round, and reactive to light.  Neck: Normal range of motion. Neck supple.  Cardiovascular: Normal rate.  Respiratory: Effort normal and breath sounds normal. No respiratory distress. She has no wheezes.  GI: Soft. Bowel sounds are normal. She exhibits no distension.  Non-tender.  Musculoskeletal: She exhibits no edema or tenderness. No spasms or "knot" on chest wall Neurological: She is alert and oriented to person, place, and time.  Speech clear. Follows commands without difficulty. impaired proprioception/LT in both legs. Strength: 3/5HF,KE bilaterally and 4- ADF/APF. UE's 5/5.  Skin: Skin is warm and dry. Back incision remains clean,dry and intact.  Psych: pleasant and appropriate  Assessment/Plan: 1. Functional deficits secondary to T10-12 hematoma with subsequent myelopathy and paraplegia which require 3+ hours per day of interdisciplinary therapy in a  comprehensive inpatient rehab setting. Physiatrist is providing close team supervision and 24 hour management of active medical problems listed below. Physiatrist and rehab team continue to assess barriers to discharge/monitor patient progress toward functional and medical goals. FIM: FIM - Bathing Bathing Steps Patient Completed: Chest, Right Arm, Left Arm, Abdomen, Front perineal area, Buttocks, Right upper leg, Left upper leg, Right lower leg (including foot), Left lower leg (including foot) Bathing: 5: Supervision: Safety issues/verbal cues (supine in bed, pt required mod verbal cues to adhere to back precautions)  FIM - Upper Body Dressing/Undressing Upper body dressing/undressing steps patient completed: Thread/unthread right sleeve of pullover shirt/dresss, Thread/unthread left sleeve of pullover shirt/dress, Put head through opening of pull over shirt/dress, Pull shirt over trunk Upper body dressing/undressing: 5: Supervision: Safety issues/verbal cues FIM - Lower Body Dressing/Undressing Lower body dressing/undressing steps patient completed: Thread/unthread left pants leg, Thread/unthread right pants leg, Pull pants up/down, Don/Doff right sock, Don/Doff left sock Lower body dressing/undressing: 5: Supervision: Safety issues/verbal cues (supine in bed)  FIM - Toileting Toileting steps completed by patient: Performs perineal hygiene Toileting Assistive Devices: Grab bar or rail for support Toileting: 2: Max-Patient completed 1 of 3 steps  FIM - Diplomatic Services operational officer Devices: Elevated toilet seat Toilet Transfers: 3-From toilet/BSC: Mod A (lift or lower assist)  FIM - Banker Devices: Arm rests Bed/Chair Transfer: 3: Sit > Supine: Mod A (lifting assist/Pt. 50-74%/lift 2 legs), 4: Chair or W/C > Bed: Min A (steadying Pt. > 75%), 4: Bed > Chair or W/C: Min A (steadying Pt. > 75%)  FIM - Locomotion:  Wheelchair Distance:  150 Locomotion: Wheelchair: 5: Travels 150 ft or more: maneuvers on rugs and over door sills with supervision, cueing or coaxing FIM - Locomotion: Ambulation Locomotion: Ambulation Assistive Devices: MaxiSky, Environmental consultantWalker - Rolling Ambulation/Gait Assistance: 3: Mod assist, 1: +2 Total assist Locomotion: Ambulation: 1: Two helpers (chair follow)  Comprehension Comprehension Mode: Auditory Comprehension: 6-Follows complex conversation/direction: With extra time/assistive device  Expression Expression Mode: Verbal Expression: 6-Expresses complex ideas: With extra time/assistive device  Social Interaction Social Interaction: 6-Interacts appropriately with others with medication or extra time (anti-anxiety, antidepressant).  Problem Solving Problem Solving: 6-Solves complex problems: With extra time  Memory Memory: 6-More than reasonable amt of time  Medical Problem List and Plan: 1. Functional deficits secondary to T 10-12 hematoma with myelopathy and paraplegia 2. PE/BLE DVT /Anticoagulation: Mechanical: Sequential compression devices, below knee Bilateral lower extremities No blood thinner due to thoracic hematoma--verified with Dr. Jordan LikesPool  3. Pain Management: Continue oxycodone prn.   -will change robaxin to baclofen for spasms 4. Mood: LCSW to follow for evaluation and support.  5. Neuropsych: This patient is not quite capable of making decisions on her own behalf. 6. Skin/Wound Care:  7. Fluids/Electrolytes/Nutrition: Monitor I/O.  Intake improving with family bring in food.    -supps  8. E coli UTI: suprax for 7 days 9. Neurogenic bowel: large bm yesterday  -regular bowel regimen 10. Neurogenic bladder: voiding trial underway  -UTI still being treated  11. Hyponatremia: Slowly improving. Stopped NaCl tabs---normal 135  12. Hypotension: Will continue florinef. Changed flomax to bedtime to avoid drop during the day. continue abdominal binder and TEDs to help with BP  support.  13. Afib: ON ASA for stroke prevention. 14. Hypokalemia:  Follow up labs, supplement   LOS (Days) 5 A FACE TO FACE EVALUATION WAS PERFORMED  Margorie Renner T 12/04/2014 7:52 AM

## 2014-12-04 NOTE — Progress Notes (Signed)
Physical Therapy Session Note  Patient Details  Name: [name] Weber MRN: [id number] Date of Birth: 03/20/1931  Today's Date: 12/04/2014 PT Individual Time: 0900-1000 PT Individual Time Calculation (min): 60 min   Short Term Goals: Week 1:  PT Short Term Goal 1 (Week 1): Pt will perform bed mobility with Min A using rolling technique. PT Short Term Goal 2 (Week 1): Pt will perform sliding board transfer with Mod A  PT Short Term Goal 3 (Week 1): Pt will perform dynamic sitting balance x5 min with close S PT Short Term Goal 4 (Week 1): Pt will ambualte 5725' with assist of 1 and RW PT Short Term Goal 5 (Week 1): Pt will stand x5 min during functional task with min A  Skilled Therapeutic Interventions/Progress Updates:    Patient received sitting in wheelchair. Session focused on wheelchair mobility, functional and sit<>stand transfers, gait training, stair negotiation, and stretching. Wheelchair mobility >150'x 1 in controlled environment with B UEs and supervision. Gait training 140' x1 with RW and min-modA x2 helpers and 2660' x1 with RW and modA x1 helper in controlled environment. Verbal and tactile cues for B knee extension during initial contact to midstance and min facilitation for RW management to maintain straight path. Stair negotiation x5 steps with B handrails and +2, modA x1 for majority of stair negotiation and +2 required for turning. Patient able to state proper sequencing, but then requires min cues to perform.  Due to c/o L hip pain, patient transferred to mat for passive stretching of L hip. Patient transfers wheelchair>mat via squat pivot and supervision, minA for sit>supine for L LE management and verbal cues for log rolling technique. In supine with upper body support by wedge, passive stretching of L hip and leg with emphasis on piriformis and glutes. Patient requires minA for supine>sit and cues to maintain log rolling. Repeated sit<>stands from EOM with manual facilitation  provided for L knee approximation/stabilization and patient requires modA.  Patient returned to room, left with all needs within reach, and ice applied to L hip  Therapy Documentation Precautions:  Precautions Precautions: Fall, Back Precaution Booklet Issued: No Precaution Comments: watch orthostatic BP Restrictions Weight Bearing Restrictions: No Pain: Pain Assessment Pain Assessment: 0-10 Pain Score: 6  Pain Type: Acute pain Pain Location: Hip Pain Orientation: Left Pain Descriptors / Indicators: Aching;Sore Pain Onset: On-going Pain Intervention(s): RN made aware;Repositioned;Cold applied;Ambulation/increased activity Multiple Pain Sites: No Locomotion : Ambulation Ambulation/Gait Assistance: 1: +2 Total assist;4: Min assist;3: Mod assist Wheelchair Mobility Distance: 150   See FIM for current functional status  Therapy/Group: Individual Therapy  Chipper HerbBridget S Seferina Brokaw S. Sanaia Jasso, PT, DPT 12/04/2014, 11:35 AM

## 2014-12-04 NOTE — Progress Notes (Signed)
Recreational Therapy Session Note  Patient Details  Name: [name] Weber MRN: [id number] Date of Birth: 08/17/1931 Today's Date: 12/04/2014  Pain: c/o of L hip pain, unrated, premedicated & ice pack applied Skilled Therapeutic Interventions/Progress Updates: Session focused on discharge planning, discussing current status & pt's goals for rehab.  Pt with c/o of pain but requesting to egage in activity as a diversion.  Out & about to gift shop w/c level reaching for items with supervision.    Therapy/Group: Individual Therapy   [name] 12/04/2014, 4:30 PM

## 2014-12-04 NOTE — Progress Notes (Signed)
Occupational Therapy Session Notes  Patient Details  Name: [name] Weber MRN: [id number] Date of Birth: 07/16/1931  Today's Date: 12/04/2014  Short Term Goals: Week 1:  OT Short Term Goal 1 (Week 1): Pt will be able to don pants over feet with use of reacher. OT Short Term Goal 2 (Week 1): Pt will be able to stand from w/c with mod A to be able to pull pants over hips. OT Short Term Goal 3 (Week 1): Pt will be able to transfer to a tub bench with mod A. OT Short Term Goal 4 (Week 1): Pt will be able to transfer to a BSC drop arm with mod A x 1. OT Short Term Goal 5 (Week 1): Pt will be able to bathe feet with long handled sponge.  Skilled Therapeutic Interventions/Progress Updates:   Session #1 [phone] [id number] - 60 Minutes Individual Therapy Patient with 9/10 complaints of pain in bilateral hips, RN aware of pain Skilled intervention focusing on bed mobility, functional transfers, ADL retraining, pain management, dynamic sitting balance/tolerance/endurance, and overall activity tolerance/endurance. Patient received supine in bed. Patient stated she was recently diagnosed with a UTI and had increased pain. Patient stated she recently received pain medication and was willing to work with therapist. Patient engaged in bed mobility with supervision and transferred EOB>w/c using slide board (secondary to increased pain) with min assist. Therapist assisted patient into BR for shower stall transfer onto tub transfer bench. Patient completed UB/LB bathing in seated position. Patient with incontinent BM, requiring total assist for clean up. After shower, patient transferred > w/c with min assist and completed grooming tasks while seated at sink and UB/LB dressing in seated position. Patient's pants too tight, therefore therapist recommended looser pants to increase independence with LB dressing. RN present for medication management during session breaks. Therapist notified RN of needed dressing changes,  incontinent BM, swollen ankles, and increased pain/spasms. At end of session, left patient seated in w/c with RN present.   Session #2 [id number] - 15 Minutes Missed 45 minutes secondary to pain Individual Therapy Patient received seated in w/c with head down and patient looked in distress. Patient with increased pain in left hip and unable to put pressure/weight throughout left hip. Patient with difficulty expressing self, getting words mixed up and stuttering. Therapist encouraged patient to participate in therapy, but patient stated she was unable and needed to lay down. Therapist assisted patient to bed using slide board with min assist. Patient required min verbal cues for correct body mechanics and safety for bed mobility. NT present to scan bladder and patient with incontinent episode. Therapist administered moist heat pack > left hip. Left patient supine in bed with NT finishing up. Scheduling team aware of missed minutes.   Therapy Documentation Precautions:  Precautions Precautions: Fall, Back Precaution Booklet Issued: No Precaution Comments: watch orthostatic BP Restrictions Weight Bearing Restrictions: No (WBAT )   ADL: ADL ADL Comments: Refer to FIM  See FIM for current functional status  Bridget  12/04/2014, 7:20 AM

## 2014-12-05 ENCOUNTER — Inpatient Hospital Stay (HOSPITAL_COMMUNITY): Payer: Self-pay

## 2014-12-05 ENCOUNTER — Inpatient Hospital Stay (HOSPITAL_COMMUNITY): Payer: Medicare Other | Admitting: *Deleted

## 2014-12-05 ENCOUNTER — Encounter (HOSPITAL_COMMUNITY): Payer: Self-pay | Admitting: Occupational Therapy

## 2014-12-05 ENCOUNTER — Inpatient Hospital Stay (HOSPITAL_COMMUNITY): Payer: Medicare Other | Admitting: Occupational Therapy

## 2014-12-05 ENCOUNTER — Inpatient Hospital Stay (HOSPITAL_COMMUNITY): Payer: Self-pay | Admitting: Occupational Therapy

## 2014-12-05 ENCOUNTER — Encounter: Payer: Self-pay | Admitting: Cardiology

## 2014-12-05 LAB — CBC
HCT: 35.9 % — ABNORMAL LOW (ref 36.0–46.0)
Hemoglobin: 11.9 g/dL — ABNORMAL LOW (ref 12.0–15.0)
MCH: 32.6 pg (ref 26.0–34.0)
MCHC: 33.1 g/dL (ref 30.0–36.0)
MCV: 98.4 fL (ref 78.0–100.0)
Platelets: 209 10*3/uL (ref 150–400)
RBC: 3.65 MIL/uL — ABNORMAL LOW (ref 3.87–5.11)
RDW: 12.5 % (ref 11.5–15.5)
WBC: 3.8 10*3/uL — ABNORMAL LOW (ref 4.0–10.5)

## 2014-12-05 LAB — COMPREHENSIVE METABOLIC PANEL
ALT: 30 U/L (ref 0–35)
AST: 38 U/L — ABNORMAL HIGH (ref 0–37)
Albumin: 2.5 g/dL — ABNORMAL LOW (ref 3.5–5.2)
Alkaline Phosphatase: 69 U/L (ref 39–117)
Anion gap: 7 (ref 5–15)
BUN: 5 mg/dL — ABNORMAL LOW (ref 6–23)
CO2: 27 mmol/L (ref 19–32)
Calcium: 8.2 mg/dL — ABNORMAL LOW (ref 8.4–10.5)
Chloride: 102 mEq/L (ref 96–112)
Creatinine, Ser: 0.6 mg/dL (ref 0.50–1.10)
GFR calc Af Amer: >90 mL/min (ref >90)
GFR calc non Af Amer: 83 mL/min — ABNORMAL LOW (ref >90)
Glucose, Bld: 96 mg/dL (ref 70–99)
Potassium: 3.8 mmol/L (ref 3.5–5.1)
Sodium: 136 mmol/L (ref 135–145)
Total Bilirubin: 0.6 mg/dL (ref 0.3–1.2)
Total Protein: 5.2 g/dL — ABNORMAL LOW (ref 6.0–8.3)

## 2014-12-05 MED ORDER — BETHANECHOL CHLORIDE 25 MG PO TABS
25.0000 mg | ORAL_TABLET | Freq: Three times a day (TID) | ORAL | Status: DC
  Administered 2014-12-05 – 2014-12-06 (×6): 25 mg via ORAL
  Filled 2014-12-05 (×10): qty 1

## 2014-12-05 NOTE — Progress Notes (Signed)
Physical Therapy Session Note  Patient Details  Name: [name] Weber MRN: [id number] Date of Birth: 07/17/1931  Today's Date: 12/05/2014 PT Individual Time: 0930-1030 (Co-treatment with rec therapist) PT Individual Time Calculation (min): 60 min   Short Term Goals: Week 1:  PT Short Term Goal 1 (Week 1): Pt will perform bed mobility with Min A using rolling technique. PT Short Term Goal 2 (Week 1): Pt will perform sliding board transfer with Mod A  PT Short Term Goal 3 (Week 1): Pt will perform dynamic sitting balance x5 min with close S PT Short Term Goal 4 (Week 1): Pt will ambualte 7425' with assist of 1 and RW PT Short Term Goal 5 (Week 1): Pt will stand x5 min during functional task with min A  Skilled Therapeutic Interventions/Progress Updates:    Co-treatment with rec therapist focusing on dynamic sitting/standing balance, gait training, and activity tolerance. Pt received semi reclined in bed; agreeable to therapy. Pt performed supine > sit with min A, mod cueing for logroll technique; HOB flat using rail. Pt performed slide board transfers from bed > w/c with min A and from w/c > mat table with supervision. Pt required cueing for w/c parts management (within parameters of spinal precautions) with mod cueing for arm rest management, slide board placement with within-session carryover of 50% of cues.   Pt performed gait 2 x15' with rolling walker and mod A for stability (+2A for close w/c follow due to pt tendency toward knee buckling); manual repositioning of L step placement. Pt performed w/c mobility 2x150' in controlled environment with bilat UE's and supervision. Final gait trial x30' in controlled environment with min A (+2A for w/c follow), 2 lb cuff weight at L ankle for increased proprioception appeared somewhat effective in improving L step placement. Gait trial ended due to fatigue, increasingly more bilat hip/knee flexion throughout gait cycle. See below for detailed description  of NMR. Session ended in pt room, where pt was left seated in w/c with all needs within reach.  Therapy Documentation Precautions:  Precautions Precautions: Fall, Back Precaution Booklet Issued: No Precaution Comments: watch orthostatic BP Restrictions Weight Bearing Restrictions: No Pain: Pain Assessment Pain Assessment: No/denies pain Pain Score: 0-No pain Locomotion : Ambulation Ambulation/Gait Assistance: 1: +2 Total assist;4: Min assist;3: Mod assist Wheelchair Mobility Distance: 150  NMR: Neuromuscular Facilitation: Right;Left;Lower Extremity;Forced use;Activity to increase coordination;Activity to increase motor control;Activity to increase anterior-posterior weight shifting;Activity to increase timing and sequencing;Activity to increase lateral weight shifting;Activity to increase sustained activation Standing in front of mat table with bilat UE support at rolling walker, pt performed kicked ball (rolling on ground) with RLE then LLE. Pt required min guard when kicking with RLE and min to mod A for kicking with LLE. Transitioned seated EOM without UE/LE support throwing/catching and kicking ball for increased trunk control. Verbal cueing focused on recognition, self-correction of increased posterior trunk lean. Sit<>stand from elevated mat table with focus on motor control and anterior weight shifting. Performed multiple trials of sit<>stand transfers with single UE support; tactile cueing at ribcage to emphasize erect trunk flexion.   See FIM for current functional status  Therapy/Group: Individual Therapy and Co-Treatment  [place]obble, Blair A 12/05/2014, 10:48 AM

## 2014-12-05 NOTE — Patient Care Conference (Signed)
Inpatient RehabilitationTeam Conference and Plan of Care Update Date: 12/04/2014   Time: 2:25 PM    Patient Name: [name] Weber      Medical Record Number: [id number]  Date of Birth: November 22, 1931 Sex: Female         Room/Bed: 4W03C/4W03C-01 Payor Info: Payor: Advertising copywriter MEDICARE / Plan: AARP MEDICARE COMPLETE INACTIVE 2016-01 / Product Type: *No Product type* /    Admitting Diagnosis: t10 12 hematoma with paraplegia   Admit Date/Time:  11/29/2014  3:54 PM Admission Comments: No comment available   Primary Diagnosis:  Paraplegia at T9 level Principal Problem: Paraplegia at T9 level  Patient Active Problem List   Diagnosis Date Noted  . Neurogenic bladder 11/30/2014  . Neurogenic bowel 11/30/2014  . E. coli UTI 11/30/2014  . Thoracic myelopathy 11/29/2014  . Persistent atrial fibrillation 11/27/2014  . Traumatic spinal subdural hematoma   . Acute pulmonary embolism   . Orthostatic hypotension 11/22/2014  . Near syncope 11/21/2014  . Paraplegia at T9 level 11/17/2014  . Weakness of both legs   . Nocturnal leg cramps 07/16/2014  . Encounter for therapeutic drug monitoring 12/25/2013  . Malaise and fatigue 06/01/2011  . Atrial flutter 03/10/2011  . Encounter for long-term (current) use of anticoagulants 03/10/2011  . Benign hypertensive heart disease without heart failure 03/10/2011  . Hypercholesterolemia 03/10/2011  . Paroxysmal atrial flutter   . Palpitations   . PAC (premature atrial contraction)   . PVC's (premature ventricular contractions)   . Malaise   . Fatigue   . Myalgia     Expected Discharge Date: Expected Discharge Date: 12/18/14  Team Members Present: Physician leading conference: Dr. Faith Rogue Social Worker Present: Amada Jupiter, LCSW Nurse Present: Carlean Purl, RN PT Present: Karolee Stamps, PT OT Present: Roney Mans, OT;Patricia Mat Carne, OT SLP Present: Feliberto Gottron, SLP PPS Coordinator present : Tora Duck, RN, CRRN     Current  Status/Progress Goal Weekly Team Focus  Medical   thoracic spinal cord hematoma and myelopathy  improve pain control.   anxiety mgt, pain control.    Bowel/Bladder   Can be incontinent of bowel. LBM 12/03/14. Foley recently removed. Trial vod at this time  Managed bowel and bladder  Continue to to reassess for managed bowel and bladder   Swallow/Nutrition/ Hydration             ADL's   overall min assist  overall supervision > mod I w/c level vs sit<>stand level (depending on progress)  ADL retraining, functional transfers, sit<>stands, dynamic standing, functional strengthening, functional endurance   Mobility   mod A tranfsers; +2/lift equipment for gait and stairs; S w/c propulsion  S w/c level; min A gait short distances; min A stairs  neuro re-ed for balance and coordination, strengthening, endurance, gait training,    Communication             Safety/Cognition/ Behavioral Observations            Pain   Pain managed with scheduled Tramadol  and Robaxin  q 6hrs   <3  Assess for effectiveness   Skin   Thoracic incision with dressing CDI  No additional skin breakdown  Assess q shift for appropriate healing    Rehab Goals Patient on target to meet rehab goals: Yes *See Care Plan and progress notes for long and short-term goals.  Barriers to Discharge: pain, anxiety,     Possible Resolutions to Barriers:  rx pain, sci education. working through pain  Discharge Planning/Teaching Needs:  pt hopeful she can d/c to her own home with only intermittent support of her friends/ family      Team Discussion:  B/b beginning to return but still with some incontinence and some cath needs.  Hip pain significant today.  MD aware and addressing ?anxiety ?neuropathic pain.  Making very good progress and motivated.  Hopeful pt can reach an ambulatory level but may require supervision (?along with mod i w/c goals.)    Revisions to Treatment Plan:  None   Continued Need for Acute  Rehabilitation Level of Care: The patient requires daily medical management by a physician with specialized training in physical medicine and rehabilitation for the following conditions: Daily direction of a multidisciplinary physical rehabilitation program to ensure safe treatment while eliciting the highest outcome that is of practical value to the patient.: Yes Daily medical management of patient stability for increased activity during participation in an intensive rehabilitation regime.: Yes Daily analysis of laboratory values and/or radiology reports with any subsequent need for medication adjustment of medical intervention for : Pulmonary problems;Neurological problems  [name] Weber 12/05/2014, 11:06 AM

## 2014-12-05 NOTE — Progress Notes (Signed)
Recreational Therapy Session Note  Patient Details  Name: [name] Weber MRN: [id number] Date of Birth: 01/02/1931 Today's Date: 12/05/2014  Pain: no c/o Skilled Therapeutic Interventions/Progress Updates: Session focused on activity tolerance, dynamic sitting balance, dynamic standing balance with RW.  Pt sat EOM on elevated mat for ball toss/catch activity with close supervision.  Pt stood with RW to kick a ball with BLE's with mod assist & verbal cues for posture.  Therapy/Group: Co-Treatment   Shamari Trostel 12/05/2014, 3:58 PM

## 2014-12-05 NOTE — Progress Notes (Signed)
Occupational Therapy Note  Patient Details  Name: [name] Weber MRN: [id number] Date of Birth: 03/07/1931  Today's Date: 12/05/2014 OT Individual Time: 1330-1400 OT Individual Time Calculation (min): 30 min   Pt c/o 4/10 pain in left hip; RN aware and repositioned in bed Individual Therapy  Pt engaged in bed mobility and sit<>stand using STEDY.  Pt transferred w/c->bed with min A for squat pivot transfers.  Pt able to stand with STEDY without assistance.  Pt engaged in dynamic standing tasks while standing in STEDY.  Close supervision/steady A when engaging BUE for functional tasks.  Pt required assistance with LLE when laying back into bed.  Focus on activity tolerance, sit<>stand, standing balance, transfers, bed mobility, and safety awareness.  [name] Weber, [name] Weber [place] [facility] 12/05/2014, 2:28 PM

## 2014-12-05 NOTE — Progress Notes (Signed)
Occupational Therapy Session Note  Patient Details  Name: Stephani Policevelyn C Breuer MRN: [id number] Date of Birth: 11/10/1931  Today's Date: 12/05/2014 OT Individual Time: 1501-1601 OT Individual Time Calculation (min): 60 min    Short Term Goals: Week 1:  OT Short Term Goal 1 (Week 1): Pt will be able to don pants over feet with use of reacher. OT Short Term Goal 2 (Week 1): Pt will be able to stand from w/c with mod A to be able to pull pants over hips. OT Short Term Goal 3 (Week 1): Pt will be able to transfer to a tub bench with mod A. OT Short Term Goal 4 (Week 1): Pt will be able to transfer to a BSC drop arm with mod A x 1. OT Short Term Goal 5 (Week 1): Pt will be able to bathe feet with long handled sponge.  Skilled Therapeutic Interventions/Progress Updates:  Upon entering room, pt supine in bed sleeping but agreeing to participate in session. Pt with reported 7/10 pain in L hip during session described as sharp pain but decrease to 0/10 with repositioning. Pt demonstrated B LE stretches as educated on earlier today with verbal cues for purse lip breathing for relaxation during stretching. Pt engaged in Supine > sit with Min A for assistance with trunk. Steady utilized for STS x 6 reps this session. Pt standing in steady in front of mirror in order to comb hair with 1 UE supported and required min A. Pt then washing hand while standing in Steady with Min - Mod A for dynamic standing balance and UEs unsupported. Pt standing each time in frame with supervision and standing for ~ 1 minute with verbal cues to correct posture. Pt sat back onto bed from steady where she then demonstrated lateral scoot from bed into wheelchair with close supervision. OT discusses concerns within home regarding fall risk, measuring mattress, and favorite chair in order to practice within this environment. Pt verbalized understanding. Family friend entering room at this time and pt left seated in wheelchair with call bell  within reach.   Therapy Documentation Precautions:  Precautions Precautions: Fall, Back Precaution Booklet Issued: No Precaution Comments: watch orthostatic BP Restrictions Weight Bearing Restrictions: No ADL: ADL ADL Comments: Refer to FIM  See FIM for current functional status  Therapy/Group: Individual Therapy  Lowella Gripittman, Renesmae Donahey L 12/05/2014, 4:30 PM

## 2014-12-05 NOTE — Progress Notes (Signed)
Occupational Therapy Session Notes  Patient Details  Name: [name] Weber MRN: [id number] Date of Birth: 08/01/1931  Today's Date: 12/05/2014  Short Term Goals: Week 1:  OT Short Term Goal 1 (Week 1): Pt will be able to don pants over feet with use of reacher. OT Short Term Goal 2 (Week 1): Pt will be able to stand from w/c with mod A to be able to pull pants over hips. OT Short Term Goal 3 (Week 1): Pt will be able to transfer to a tub bench with mod A. OT Short Term Goal 4 (Week 1): Pt will be able to transfer to a BSC drop arm with mod A x 1. OT Short Term Goal 5 (Week 1): Pt will be able to bathe feet with long handled sponge.  Skilled Therapeutic Interventions/Progress Updates:   Session #1 [phone] - 60 Minutes Patient with pain throughout session, see below Individual Therapy Patient received supine in bed stating she had a rough night last night. Patient started engaging in bed mobility and with 10/10 pain in left hip; RN made aware. Patient willing to work with therapist through the pain. Patient required assistance to don socks seated EOB secondary to pain. Patient transferred EOB>w/c using slide board (again secondary to pain) with min assist. Patient worked on self propulsion into BR and transferred onto tub transfer bench with min assist. Patient completed UB/LB bathing in seated position with min assist and occasional steady assist secondary to decreased strength in trunk and BLEs. Patient's pain decreased > 6/10 in left hip during shower. Patient dried, transferred back to w/c, and propelled self back into room. From here, grooming tasks seated at sink and UB/LB dressing completed. Patient with incontinent BM, therapist assisted patient back to bed for clean-up. Assisted patient onto bed pan. Therapist administered moist heat pack for pain management > left hip. At end of session, left patient in bed in chair like position with moist heat pack > left hip and all needs within  reach. Patient made comment "pain is going away" at end of session. RN was present during session for medication management.   Session #2 1100-1200 - 60 Minutes Individual Therapy Patient with minimal pain, no major complaints of pain Patient received seated in w/c. Patient propelled self from room > therapy gym using BUEs. Therapeutic activity focusing on sit<>stands using STEDY, dynamic standing balance/tolerance/endurance, overall activity tolerance/endurance, functional transfer onto therapy mat, stretching > BLEs, education on stretching as a HEP, and self propulsion back to room using BLEs (as a strengthening activity). At end of session, left patient seated in w/c with all needs within reach. Patient with no complaints of pain at end of session, patient stated "I feel better".   Precautions:  Precautions Precautions: Fall, Back Precaution Booklet Issued: No Precaution Comments: watch orthostatic BP Restrictions Weight Bearing Restrictions: No  See FIM for current functional status  Hermenegildo Clausen 12/05/2014, 7:21 AM

## 2014-12-05 NOTE — Progress Notes (Signed)
Social Work  Social Work Assessment and Plan  Patient Details  Name: [name] Weber MRN: [id number] Date of Birth: 03/23/1931  Today's Date: 12/03/2014  Problem List:  Patient Active Problem List   Diagnosis Date Noted  . Neurogenic bladder 11/30/2014  . Neurogenic bowel 11/30/2014  . E. coli UTI 11/30/2014  . Thoracic myelopathy 11/29/2014  . Persistent atrial fibrillation 11/27/2014  . Traumatic spinal subdural hematoma   . Acute pulmonary embolism   . Orthostatic hypotension 11/22/2014  . Near syncope 11/21/2014  . Paraplegia at T9 level 11/17/2014  . Weakness of both legs   . Nocturnal leg cramps 07/16/2014  . Encounter for therapeutic drug monitoring 12/25/2013  . Malaise and fatigue 06/01/2011  . Atrial flutter 03/10/2011  . Encounter for long-term (current) use of anticoagulants 03/10/2011  . Benign hypertensive heart disease without heart failure 03/10/2011  . Hypercholesterolemia 03/10/2011  . Paroxysmal atrial flutter   . Palpitations   . PAC (premature atrial contraction)   . PVC's (premature ventricular contractions)   . Malaise   . Fatigue   . Myalgia    Past Medical History:  Past Medical History  Diagnosis Date  . Paroxysmal atrial flutter   . Palpitations     OCCASSIONAL  . PAC (premature atrial contraction)     ISOLATED  . PVC's (premature ventricular contractions)     ISOLATED  . Malaise   . Fatigue   . Myalgia    Past Surgical History:  Past Surgical History  Procedure Laterality Date  . Vulvar lesion removal  01/01/2009    She had a 1-cm area of erythema to the right of the urethral meatus  . Thoracic laminectomy for epidural abscess N/A 11/16/2014    Procedure: THORACIC LAMINECTOMY FOR EPIDURAL ABSCESS;  Surgeon: Karn CassisErnesto M Botero, MD;  Location: [facility];  Service: Neurosurgery;  Laterality: N/A;   Social History:  reports that she quit smoking about 23 years ago. She does not have any smokeless tobacco history on file. She reports  that she does not drink alcohol or use illicit drugs.  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) Children: one adult son, Ferman HammingJack Antonacci @ [phone]) [phone];  daughter-in-law, Romona CurlsFay Frasier  @ [phone]) [phone] - [place], [place] [employer]) Other Supports: sister, Anselmo PicklerJoan Gough [facility]([place]) @ (H) [phone];  Keene Breathneice, Kay Sasser [facility]([place]) @ [phone](H) [phone];  neighbors "like sisters":  Rollen SoxFaye Mohorn @ (C) [phone] and [address] @ [phone] Anticipated Caregiver: TBD - daughter-in-law has offered to stay and assist patient, however, pt would like to not require this.  Friends/ neighbors can check on pt qd Ability/Limitations of Caregiver: Dtr-in-law has offered to go stay with patient and care for her after rehab stay - she has not limitations in doing so. Caregiver Availability: 24/7 Family Dynamics: Son, dtr-in-law and friends all VERY supportive and willing to assist.  Pt is the primary one with reluctance to having anyone stay with her at home.  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 Legal Hisotry/Current Legal Issues: None Guardian/Conservator: None - per MD, pt capable of making decisions on her own behalf   Abuse/Neglect  Physical Abuse: Denies Verbal Abuse: Denies Sexual Abuse: Denies Exploitation of patient/patient's resources: Denies Self-Neglect: Denies  Emotional Status Pt's affect, behavior adn adjustment status: Pt very pleasant and VERY motivated for CIR tx with hopes to return to independent functional level.  Reports that, while she is frustrated by her  circumstances, "I look around here in the gym and see people alot worse off than myself."  Describes herself as an "optimist" and very independent.  Will monitor emotional adjustment as she progresses and may refer to neuropsychology if warranted. Recent Psychosocial Issues: None Pyschiatric History: None Substance Abuse History: None  Patient / Family Perceptions, Expectations & Goals Pt/Family understanding of illness & functional limitations: Pt and family with good understanding of the medical issues and of current functional limitations/ need for CIR. Premorbid pt/family roles/activities: Very active in the community with volunteer job (Hospice), her church and "taking care of friends and neighbors when they need help." Anticipated changes in roles/activities/participation: Pt will certainly be limited in the amount of community level activities she can resume initially.  She is so very motivated that I would suspect she will find ways to modify her activities to allow her to be as active in the community as possible. Pt/family expectations/goals: "I really want to figure out how I can be as independent as possible."  [employer]: None Premorbid Home Care/DME Agencies: None Transportation available at discharge: yes Resource referrals recommended: Neuropsychology, Support group (specify)  Discharge Planning Living Arrangements: Alone Support Systems: Children, Manufacturing engineer, Other relatives, Church/faith community Type of Residence: Private residence Insurance Resources: Medicare ((AARP Medicare)) Surveyor, quantity Resources: Restaurant manager, fast food Screen Referred: No Living Expenses: Database administrator Management: Patient Does the patient have any problems obtaining your medications?: No Home Management: pt Patient/Family Preliminary Plans: pt plans to return to her own home with family and friends to assist as needed and possibly hiring some private duty  assistance Barriers to Discharge: Steps Social Work Anticipated Follow Up Needs: HH/OP Expected length of stay: 15-20 days  Clinical Impression Very motivated woman here following a spinal hematoma and now with paraparesis.  Lives alone but has good supports via family and friends.  She is very focused on being able to return to her own home at some level of independence.  Denies any significant emotional distress but will monitor.  Follow for assistance with d/c planning and support.  Kla Bily 12/03/2014, 9:08 AM

## 2014-12-05 NOTE — Care Management Note (Signed)
[facility]patient Rehabilitation [facility] Individual Statement of Services  Patient Name:  Stephani Policevelyn C Blattner  Date:  12/03/2014  Welcome to the [facility]patient Rehabilitation [facility].  Our goal is to provide you with an individualized program based on your diagnosis and situation, designed to meet your specific needs.  With this comprehensive rehabilitation program, you will be expected to participate in at least 3 hours of rehabilitation therapies Monday-Friday, with modified therapy programming on the weekends.  Your rehabilitation program will include the following services:  Physical Therapy (PT), Occupational Therapy (OT), 24 hour per day rehabilitation nursing, Therapeutic Recreaction (TR), Neuropsychology, Case Management (Social Worker), Rehabilitation Medicine, Nutrition Services and Pharmacy Services  Weekly team conferences will be held on Tuesdays to discuss your progress.  Your Social Worker will talk with you frequently to get your input and to update you on team discussions.  Team conferences with you and your family in attendance may also be held.  Expected length of stay: 2-3 weeks  Overall anticipated outcome: mod independent (w/c) - min assist  Depending on your progress and recovery, your program may change. Your Social Worker will coordinate services and will keep you informed of any changes. Your Social Worker's name and contact numbers are listed  below.  The following services may also be recommended but are not provided by the Inpatient Rehabilitation Center:   Driving Evaluations  Home Health Rehabiltiation Services  Outpatient Rehabilitation Services  Vocational Rehabilitation   Arrangements will be made to provide these services after discharge if needed.  Arrangements include referral to agencies that provide these services.  Your insurance has been verified to be:  [place] Your primary doctor is:  Dr. Antony HasteMichael Badger  Pertinent information will be shared with your  doctor and your insurance company.  Social Worker:  CidraLucy Daiveon Markman, [place] [phone] or [phone]) [phone]   Information discussed with and copy given to patient by: Amada JupiterHOYLE, Meghanne Pletz, 12/03/2014, 9:11 AM

## 2014-12-05 NOTE — Progress Notes (Signed)
[facility] PHYSICAL MEDICINE & REHABILITATION     PROGRESS NOTE    Subjective/Complaints: Having flank (hip) pain when bladder is full. Requiring caths---volumes 500-600cc. Otherwise doing well A  review of systems has been performed and if not noted above is otherwise negative.   Objective: Vital Signs: Blood pressure 117/58, pulse 69, temperature 98 F (36.7 C), temperature source Oral, resp. rate 17, SpO2 94 %. No results found.  Recent Labs  12/05/14 0555  WBC 3.8*  HGB 11.9*  HCT 35.9*  PLT 209    Recent Labs  12/03/14 0500 12/05/14 0555  NA 135 136  K 3.9 3.8  CL 100 102  GLUCOSE 96 96  BUN 5* 5*  CREATININE 0.55 0.60  CALCIUM 8.2* 8.2*   CBG (last 3)  No results for input(s): GLUCAP in the last 72 hours.  Wt Readings from Last 3 Encounters:  11/17/14 59.8 kg (131 lb 13.4 oz)  11/13/14 48.535 kg (107 lb)  07/16/14 48.988 kg (108 lb)    Physical Exam:  Constitutional: She is oriented to person, place, and time. She appears well-developed.  HENT: oral mucosa moist, dentition good Head: Normocephalic and atraumatic.  Eyes: Conjunctivae are normal. Pupils are equal, round, and reactive to light.  Neck: Normal range of motion. Neck supple.  Cardiovascular: Normal rate.  Respiratory: Effort normal and breath sounds normal. No respiratory distress. She has no wheezes.  GI: Soft. Bowel sounds are normal. She exhibits no distension.  Abdomen Non-tender.  Musculoskeletal: She exhibits no edema or tenderness. No spasms or "knot" on chest wall Neurological: She is alert and oriented to person, place, and time.  Speech clear. Follows commands without difficulty. impaired proprioception/LT in both legs. Strength: 3+/5HF,KE bilaterally and 4- ADF/APF. UE's 5/5.  Skin: Skin is warm and dry. Back incision remains clean,dry and intact.  Psych: pleasant and appropriate  Assessment/Plan: 1. Functional deficits secondary to T10-12 hematoma with subsequent  myelopathy and paraplegia which require 3+ hours per day of interdisciplinary therapy in a comprehensive inpatient rehab setting. Physiatrist is providing close team supervision and 24 hour management of active medical problems listed below. Physiatrist and rehab team continue to assess barriers to discharge/monitor patient progress toward functional and medical goals. FIM: FIM - Bathing Bathing Steps Patient Completed: Chest, Right Arm, Left Arm, Abdomen, Front perineal area, Buttocks, Right upper leg, Left upper leg, Right lower leg (including foot), Left lower leg (including foot) Bathing: 5: Supervision: Safety issues/verbal cues (supine in bed, pt required mod verbal cues to adhere to back precautions)  FIM - Upper Body Dressing/Undressing Upper body dressing/undressing steps patient completed: Thread/unthread right sleeve of pullover shirt/dresss, Thread/unthread left sleeve of pullover shirt/dress, Put head through opening of pull over shirt/dress, Pull shirt over trunk Upper body dressing/undressing: 5: Supervision: Safety issues/verbal cues FIM - Lower Body Dressing/Undressing Lower body dressing/undressing steps patient completed: Thread/unthread left pants leg, Thread/unthread right pants leg, Pull pants up/down, Don/Doff right sock, Don/Doff left sock Lower body dressing/undressing: 5: Supervision: Safety issues/verbal cues (supine in bed)  FIM - Toileting Toileting steps completed by patient: Performs perineal hygiene Toileting Assistive Devices: Grab bar or rail for support Toileting: 2: Max-Patient completed 1 of 3 steps  FIM - Diplomatic Services operational officer Devices: Elevated toilet seat Toilet Transfers: 3-From toilet/BSC: Mod A (lift or lower assist)  FIM - Banker Devices: Arm rests Bed/Chair Transfer: 4: Supine > Sit: Min A (steadying Pt. > 75%/lift 1 leg), 4: Sit > Supine: Min A (  steadying pt. > 75%/lift 1 leg), 5: Bed  > Chair or W/C: Supervision (verbal cues/safety issues), 5: Chair or W/C > Bed: Supervision (verbal cues/safety issues) (squat pivot)  FIM - Locomotion: Wheelchair Distance: 150 Locomotion: Wheelchair: 5: Travels 150 ft or more: maneuvers on rugs and over door sills with supervision, cueing or coaxing FIM - Locomotion: Ambulation Locomotion: Ambulation Assistive Devices: Designer, industrial/productWalker - Rolling Ambulation/Gait Assistance: 1: +2 Total assist, 4: Min assist, 3: Mod assist Locomotion: Ambulation: 1: Two helpers  Comprehension Comprehension Mode: Auditory Comprehension: 6-Follows complex conversation/direction: With extra time/assistive device  Expression Expression Mode: Verbal Expression: 6-Expresses complex ideas: With extra time/assistive device  Social Interaction Social Interaction: 6-Interacts appropriately with others with medication or extra time (anti-anxiety, antidepressant).  Problem Solving Problem Solving: 6-Solves complex problems: With extra time  Memory Memory: 6-More than reasonable amt of time  Medical Problem List and Plan: 1. Functional deficits secondary to T 10-12 hematoma with myelopathy and paraplegia 2. PE/BLE DVT /Anticoagulation: Mechanical: Sequential compression devices, below knee Bilateral lower extremities No blood thinner due to thoracic hematoma--verified with Dr. Jordan LikesPool  3. Pain Management: Continue oxycodone prn.   -  baclofen for m/s spasms  -hyoscyamine for abdominal cramping 4. Mood: LCSW to follow for evaluation and support.  5. Neuropsych: This patient is not quite capable of making decisions on her own behalf. 6. Skin/Wound Care:  7. Fluids/Electrolytes/Nutrition: Monitor I/O.  Intake improving with family bring in food.    -supps  8. E coli UTI: suprax for 7 days 9. Neurogenic bowel: large bm yesterday  -regular bowel regimen 10. Neurogenic bladder: voiding trial underway  -UTI still being treated --no need to reculture  -will start  urecholine to help emptying 11. Hyponatremia: Slowly improving. Stopped NaCl tabs---normal 135  12. Hypotension: Will continue florinef. Changed flomax to bedtime to avoid drop during the day. continue abdominal binder and TEDs to help with BP support.  13. Afib: ON ASA for stroke prevention. 14. Hypokalemia:  Follow up labs, supplement   LOS (Days) 6 A FACE TO FACE EVALUATION WAS PERFORMED  SWARTZ,ZACHARY T 12/05/2014 8:06 AM

## 2014-12-05 NOTE — Progress Notes (Signed)
Social Work Patient ID: [name] Weber, female   DOB: 08-May-1931, 81 y.o.   MRN: [id number]  Met yesterday afternoon with pt and spoke with son via phone to review team conference.  Both aware and agreeable with targeted d/c date of 12/18/14 with supervision/ mod i w/c level goals.  Pt very hopeful she might reach a level that she would not require anyone to stay 24/7 with her.  Explained that team feels she is doing exceptionally well and they are hopeful, however, the potential need for 24/7 is still there.  She understands and reports she will "do whatever I need to do."  Pt very complimentary of all the staff on the unit.  Will continue to follow for d/c planning and support needs.  Cederic Mozley, LCSW

## 2014-12-06 ENCOUNTER — Inpatient Hospital Stay (HOSPITAL_COMMUNITY): Payer: Medicare Other

## 2014-12-06 ENCOUNTER — Inpatient Hospital Stay (HOSPITAL_COMMUNITY): Payer: Self-pay | Admitting: Occupational Therapy

## 2014-12-06 DIAGNOSIS — K592 Neurogenic bowel, not elsewhere classified: Secondary | ICD-10-CM

## 2014-12-06 DIAGNOSIS — M4714 Other spondylosis with myelopathy, thoracic region: Secondary | ICD-10-CM

## 2014-12-06 DIAGNOSIS — B9620 Unspecified Escherichia coli [E. coli] as the cause of diseases classified elsewhere: Secondary | ICD-10-CM

## 2014-12-06 DIAGNOSIS — N390 Urinary tract infection, site not specified: Secondary | ICD-10-CM

## 2014-12-06 DIAGNOSIS — N319 Neuromuscular dysfunction of bladder, unspecified: Secondary | ICD-10-CM

## 2014-12-06 DIAGNOSIS — G839 Paralytic syndrome, unspecified: Secondary | ICD-10-CM

## 2014-12-06 NOTE — Progress Notes (Signed)
Occupational Therapy Session Note  Patient Details  Name: [name] Weber MRN: [id number] Date of Birth: 10/09/1931  Today's Date: 12/06/2014 OT Individual Time: 0930-1030 60 Min  Short Term Goals: Week 1:  OT Short Term Goal 1 (Week 1): Pt will be able to don pants over feet with use of reacher. OT Short Term Goal 2 (Week 1): Pt will be able to stand from w/c with mod A to be able to pull pants over hips. OT Short Term Goal 3 (Week 1): Pt will be able to transfer to a tub bench with mod A. OT Short Term Goal 4 (Week 1): Pt will be able to transfer to a BSC drop arm with mod A x 1. OT Short Term Goal 5 (Week 1): Pt will be able to bathe feet with long handled sponge.  Skilled Therapeutic Interventions/Progress Updates: ADL-retraining with focus on use of AE (LH sponge, reacher) to maintain back precautions during ADL, toileting, and transfers (toilet, tub bench).   Pt received seated on toilet with RN attending initially.    RN tech left as OT entered and pt required extra time to eliminate small BM.   Pt completed hygiene unassisted and doffed pants in prep for functional mobility to tub bench with min assist (steadying) from therapist.   Pt transferred to bench with supervision and bathed in shower using LH sponge with only standby assist after 1 demonstration.    Pt had accidental BM while bathing and transferred back to w/c to exit bathroom.   Pt transferred from w/c to edge of bed to dress with setup to don TEDs and steadying assist as she stood to pull up her pants.   Pt returned to w/c to groom and was then escorted to kitchen for introduction to planned kitchen task: simple meal prep.   Pt was escorted back to her room and visited with her friend who awaited pt's return.    Call light, phone, and reacher were within reach as pt remained in her w/c while visiting with friend.    Therapy Documentation Precautions:  Precautions Precautions: Fall, Back Precaution Booklet Issued:  No Precaution Comments: watch orthostatic BP Restrictions Weight Bearing Restrictions: No  Vital Signs: Therapy Vitals Temp: 98.6 F (37 C) Temp Source: Oral Pulse Rate: 61 Resp: 16 BP: (!) 94/50 mmHg (RN notified) Patient Position (if appropriate): Sitting Oxygen Therapy SpO2: 97 % O2 Device: Not Delivered  Pain: 4/10, left hip, showered    ADL: ADL ADL Comments: Refer to FIM  See FIM for current functional status  Therapy/Group: Individual Therapy  [name] 12/06/2014, 3:07 PM

## 2014-12-06 NOTE — Plan of Care (Signed)
Problem: RH PAIN MANAGEMENT Goal: RH STG PAIN MANAGED AT OR BELOW PT'S PAIN GOAL <5  Outcome: Not Progressing 8 with movement

## 2014-12-06 NOTE — Progress Notes (Signed)
Occupational Therapy Session Note  Patient Details  Name: [name] Weber MRN: [id number] Date of Birth: 12/22/1930  Today's Date: 12/06/2014 OT Individual Time: 1431-1531 OT Individual Time Calculation (min): 60 min    Skilled Therapeutic Interventions/Progress Updates:    Pt worked on Theatre managersimulated walk-in shower transfers with mod assist using the RW.  She was able to perform sit to stand with mod assist from various surfaces with mod demonstrational cueing for technique and hand placement.  Pt with decreased ability to transition into hip and knee extension once UE support was lost.  Discussed stepping in posteriorly in the shower with seat turned sideways.  Pt currently does not have any grab bars in the shower, so feel she will need the UE support of the walker to complete transfer  and is not safe turning around if the seat is facing the controls.  Will continue to further assess in treatment as pt progresses.  Finished session working on sit to stand and standing balance with use in the gym while engaged in Connect 4 activity.  She demonstrates decreased ability to maintain static standing without UE support with at least one UE.  Mod assist for all transitions sit to stand from the mat.   Therapy Documentation Precautions:  Precautions Precautions: Fall, Back Precaution Booklet Issued: No Precaution Comments: watch orthostatic BP Restrictions Weight Bearing Restrictions: No  Pain: Pain Assessment Pain Assessment: No/denies pain ADL: See FIM for current functional status  Therapy/Group: Individual Therapy  Peyson Postema OTR/L 12/06/2014, 5:30 PM

## 2014-12-06 NOTE — Progress Notes (Signed)
Physical Therapy Weekly Progress Note  Patient Details  Name: [name] Weber MRN: [id number] Date of Birth: 26-Apr-1931  Beginning of progress report period: December 01, 2014 End of progress report period: December 06, 2014  Today's Date: 12/06/2014   Patient has met 4 of 5 short term goals.  Pt is making great progress while on CIR. Currently pt is min A for squat pivot transfers (cues needed for technique and safety), min to mod A +2 for gait with RW, mod+2 for stairs, and S for w/c mobility on unit. Pt is displaying decreased memory in regards to new information and requiring cueing for safety with mobility. Still recommend 24/7 S upon d/c home but hoping for pt to be primarily more ambulatory than w/c level if pt continues to make functional gains in regards to strength, sensation, and balance. Pain is limiting to pt as well.  Patient continues to demonstrate the following deficits: paraparesis, decreased balance, decreased activity tolerance, pain, strength, coordination, postural control, functional mobility and therefore will continue to benefit from skilled PT intervention to enhance overall performance with activity tolerance, balance, postural control, ability to compensate for deficits, functional use of  right lower extremity and left lower extremity, awareness, coordination and knowledge of precautions.  Patient progressing toward long term goals..  Continue plan of care.  PT Short Term Goals Week 1:  PT Short Term Goal 1 (Week 1): Pt will perform bed mobility with Min A using rolling technique. PT Short Term Goal 1 - Progress (Week 1): Met PT Short Term Goal 2 (Week 1): Pt will perform sliding board transfer with Mod A  PT Short Term Goal 2 - Progress (Week 1): Met PT Short Term Goal 3 (Week 1): Pt will perform dynamic sitting balance x5 min with close S PT Short Term Goal 3 - Progress (Week 1): Met PT Short Term Goal 4 (Week 1): Pt will ambualte 93' with assist of 1 and RW PT  Short Term Goal 4 - Progress (Week 1): Partly met (+1 physical A but requires second person for w/c follow for safety) PT Short Term Goal 5 (Week 1): Pt will stand x5 min during functional task with min A PT Short Term Goal 5 - Progress (Week 1): Met Week 2:  PT Short Term Goal 1 (Week 2): Pt will perform bed mobility at S level from flat surface without rails PT Short Term Goal 2 (Week 2): Pt will transfer stand pivot/step with RW with min A PT Short Term Goal 3 (Week 2): Pt will be able to gait with RW x 50' with min A PT Short Term Goal 4 (Week 2): Pt will be able to go up/down 7 stairs with 1 rail with mod A  Skilled Therapeutic Interventions/Progress Updates:  Training and development officer;Ambulation/gait training;Cognitive remediation/compensation;Community reintegration;Discharge planning;Disease management/prevention;DME/adaptive equipment instruction;Functional mobility training;Neuromuscular re-education;Pain management;Patient/family education;Psychosocial support;Skin care/wound management;Splinting/orthotics;Stair training;Therapeutic Activities;UE/LE Strength taining/ROM;Therapeutic Exercise;UE/LE Coordination activities;Wheelchair propulsion/positioning   Therapy Documentation Precautions:  Precautions Precautions: Fall, Back Precaution Booklet Issued: No Precaution Comments: watch orthostatic BP Restrictions Weight Bearing Restrictions: No   See FIM for current functional status   Canary Brim [facility] 12/06/2014, 3:52 PM

## 2014-12-06 NOTE — Progress Notes (Signed)
Physical Therapy Session Note  Patient Details  Name: [name] Weber MRN: [id number] Date of Birth: 09/05/1931  Today's Date: 12/06/2014 PT Individual Time: 0800-0900 PT Individual Time Calculation (min): 60 min   Short Term Goals: Week 1:  PT Short Term Goal 1 (Week 1): Pt will perform bed mobility with Min A using rolling technique. PT Short Term Goal 2 (Week 1): Pt will perform sliding board transfer with Mod A  PT Short Term Goal 3 (Week 1): Pt will perform dynamic sitting balance x5 min with close S PT Short Term Goal 4 (Week 1): Pt will ambualte 6825' with assist of 1 and RW PT Short Term Goal 5 (Week 1): Pt will stand x5 min during functional task with min A  Skilled Therapeutic Interventions/Progress Updates:    Session focused on functional bed mobility (cues for log roll technique at S level for supine to sit) and transfers to various surfaces (w/c, Nustep, toilet), dynamic sitting balance seated EOB to don socks and shoes,  w/c mobility and parts management education, functional standing balance while performing clothing management during toileting, and neuro re-ed on Nustep for reciprocal movement pattern, strengthening, and motor control (focus on pt maintaining correct alignment and not letting knees adduct). Transfers with overall steady A for squat pivot and for stand pivot transfer to toilet using grab bars for support. Pt requiring cues for safety with all mobility and transfers, especially w/c parts management. Pt demonstrating decreased memory and carryover during this session, repeating herself a few times as well.   Therapy Documentation Precautions:  Precautions Precautions: Fall, Back Precaution Booklet Issued: No Precaution Comments: watch orthostatic BP Restrictions Weight Bearing Restrictions: No  Pain:  Premedicated. Still having shooting pains near L hip/back.  See FIM for current functional status  Therapy/Group: Individual Therapy Cotreatment with  Recreational Therapy for part of session.  [name], Queenie Aufiero [facility] 12/06/2014, 10:00 AM

## 2014-12-06 NOTE — Progress Notes (Signed)
Physical Therapy Session Note  Patient Details  Name: [name] Weber MRN: [id number] Date of Birth: 10/17/1931  Today's Date: 12/06/2014 PT Individual Time: 1300-1400 PT Individual Time Calculation (min): 60 min   Short Term Goals: Week 1:  PT Short Term Goal 1 (Week 1): Pt will perform bed mobility with Min A using rolling technique. PT Short Term Goal 2 (Week 1): Pt will perform sliding board transfer with Mod A  PT Short Term Goal 3 (Week 1): Pt will perform dynamic sitting balance x5 min with close S PT Short Term Goal 4 (Week 1): Pt will ambualte 2425' with assist of 1 and RW PT Short Term Goal 5 (Week 1): Pt will stand x5 min during functional task with min A  Skilled Therapeutic Interventions/Progress Updates:   Session focused on functional w/c mobility for UE strength and endurance, sit to stands with RW with focus on hand placement and technique, gait with RW with focus on neuro re-ed for BLE coordination and control, postural control, and sequencing (pt tendency to take too large of step and becoming off balance) with min to mod A needed and w/c follow (+2 assist) multiple trials, dynamic standing balance and neuro re-ed for postural control in standing using targets on the floor to maintain wide enough BOS and visual target to return foot to when performing single limb stance activity to kick ball with alternating LE's x 5 min in standing and then x 2 min before rest break needed with min to mod A, and stair negotiation up/down 4 steps with mod A (cues for step to pattern for safety and to recall task (turning around at top not going all the way over)) in preparation for simulating home entry.   Pt with increased difficulty with memory noted today. Pt having difficulty recalling that this therapist worked with this patient this morning for a 60 min session or what we worked on. Also short term recall impaired in regards to what we were addressing within the session.   Therapy  Documentation Precautions:  Precautions Precautions: Fall, Back Precaution Booklet Issued: No Precaution Comments: watch orthostatic BP Restrictions Weight Bearing Restrictions: No  Pain:  Reports pain in L hip still bothering her but manageable.  See FIM for current functional status  Therapy/Group: Individual Therapy  [name], [facility] 12/06/2014, 3:45 PM

## 2014-12-06 NOTE — Progress Notes (Signed)
[name]     PROGRESS NOTE    Subjective/Complaints: Still having voiding issues. Pain at times. No real improvement yesterday A  review of systems has been performed and if not noted above is otherwise negative.   Objective: Vital Signs: Blood pressure 130/73, pulse 82, temperature 98.2 F (36.8 C), temperature source Oral, resp. rate 18, SpO2 95 %. No results found.  Recent Labs  12/05/14 0555  WBC 3.8*  HGB 11.9*  HCT 35.9*  PLT 209    Recent Labs  12/05/14 0555  NA 136  K 3.8  CL 102  GLUCOSE 96  BUN 5*  CREATININE 0.60  CALCIUM 8.2*   CBG (last 3)  No results for input(s): GLUCAP in the last 72 hours.  Wt Readings from Last 3 Encounters:  11/17/14 59.8 kg (131 lb 13.4 oz)  11/13/14 48.535 kg (107 lb)  07/16/14 48.988 kg (108 lb)    Physical Exam:  Constitutional: She is oriented to person, place, and time. She appears well-developed.  HENT: oral mucosa moist, dentition good Head: Normocephalic and atraumatic.  Eyes: Conjunctivae are normal. Pupils are equal, round, and reactive to light.  Neck: Normal range of motion. Neck supple.  Cardiovascular: Normal rate.  Respiratory: Effort normal and breath sounds normal. No respiratory distress. She has no wheezes.  GI: Soft. Bowel sounds are normal. She exhibits no distension.  Abdomen Non-tender.  Musculoskeletal: She exhibits no edema or tenderness. No spasms or "knot" on chest wall Neurological: She is alert and oriented to person, place, and time.  Speech clear. Follows commands without difficulty. impaired proprioception/LT in both legs. Strength: 3+/5HF,KE bilaterally and 4- ADF/APF. UE's 5/5.  Skin: Skin is warm and dry. Back incision remains clean,dry and intact.  Psych: pleasant. Sl anxious  Assessment/Plan: 1. Functional deficits secondary to T10-12 hematoma with subsequent myelopathy and paraplegia which require 3+ hours per day of  interdisciplinary therapy in a comprehensive inpatient rehab setting. Physiatrist is providing close team supervision and 24 hour management of active medical problems listed below. Physiatrist and rehab team continue to assess barriers to discharge/monitor patient progress toward functional and medical goals. FIM: FIM - Bathing Bathing Steps Patient Completed: Chest, Right Arm, Left Arm, Abdomen, Front perineal area, Buttocks, Right upper leg, Left upper leg Bathing: 4: Min-Patient completes 8-9 40f 10 parts or 75+ percent (secondary to pain)  FIM - Upper Body Dressing/Undressing Upper body dressing/undressing steps patient completed: Thread/unthread right sleeve of pullover shirt/dresss, Thread/unthread left sleeve of pullover shirt/dress, Put head through opening of pull over shirt/dress, Pull shirt over trunk, Thread/unthread right bra strap, Thread/unthread left bra strap, Hook/unhook bra Upper body dressing/undressing: 5: Supervision: Safety issues/verbal cues FIM - Lower Body Dressing/Undressing Lower body dressing/undressing steps patient completed: Thread/unthread left pants leg, Thread/unthread right pants leg Lower body dressing/undressing: 2: Max-Patient completed 25-49% of tasks (secondary to pain)  FIM - Toileting Toileting steps completed by patient: Performs perineal hygiene Toileting Assistive Devices: Grab bar or rail for support Toileting: 1: Total-Patient completed zero steps, helper did all 3  FIM - Diplomatic Services operational officer Devices: Elevated toilet seat Toilet Transfers: 0-Activity did not occur  FIM - Banker Devices: Arm rests, Sliding board Bed/Chair Transfer: 4: Supine > Sit: Min A (steadying Pt. > 75%/lift 1 leg), 5: Chair or W/C > Bed: Supervision (verbal cues/safety issues), 4: Bed > Chair or W/C: Min A (steadying Pt. > 75%)  FIM - Locomotion: Wheelchair Distance: 150 Locomotion: Wheelchair:  5: Travels  150 ft or more: maneuvers on rugs and over door sills with supervision, cueing or coaxing FIM - Locomotion: Ambulation Locomotion: Ambulation Assistive Devices: Walker - Rolling, Other (comment) (2 lb weight on LLE) Ambulation/Gait Assistance: 1: +2 Total assist, 4: Min assist, 3: Mod assist Locomotion: Ambulation: 1: Two helpers  Comprehension Comprehension Mode: Auditory Comprehension: 6-Follows complex conversation/direction: With extra time/assistive device  Expression Expression Mode: Verbal Expression: 6-Expresses complex ideas: With extra time/assistive device  Social Interaction Social Interaction: 6-Interacts appropriately with others with medication or extra time (anti-anxiety, antidepressant).  Problem Solving Problem Solving: 6-Solves complex problems: With extra time  Memory Memory: 6-More than reasonable amt of time  Medical Problem List and Plan: 1. Functional deficits secondary to T 10-12 hematoma with myelopathy and paraplegia 2. PE/BLE DVT /Anticoagulation: Mechanical: Sequential compression devices, below knee Bilateral lower extremities No blood thinner due to thoracic hematoma--verified with Dr. Jordan LikesPool  3. Pain Management: Continue oxycodone prn.   -  baclofen for m/s spasms  -hyoscyamine for abdominal cramping 4. Mood: LCSW to follow for evaluation and support.  5. Neuropsych: This patient is not quite capable of making decisions on her own behalf. 6. Skin/Wound Care:  7. Fluids/Electrolytes/Nutrition: Monitor I/O.  Intake improving with family bring in food.    -supps  8. E coli UTI: suprax for 7 days 9. Neurogenic bowel: large bm yesterday  -regular bowel regimen 10. Neurogenic bladder: voiding trial underway  -Urine culture pending  -urecholine trial  -double voids, up to toilet to void  -consider flomax 11. Hyponatremia: Slowly improving. Stopped NaCl tabs---normal 135  12. Hypotension: Will continue florinef. Changed flomax to bedtime  to avoid drop during the day. continue abdominal binder and TEDs to help with BP support.  13. Afib: ON ASA for stroke prevention. 14. Hypokalemia:  Follow up labs, supplement   LOS (Days) 7 A FACE TO FACE EVALUATION WAS PERFORMED  [name] Weber T 12/06/2014 7:34 AM

## 2014-12-07 ENCOUNTER — Encounter (HOSPITAL_COMMUNITY): Payer: Medicare Other | Admitting: Occupational Therapy

## 2014-12-07 ENCOUNTER — Inpatient Hospital Stay (HOSPITAL_COMMUNITY): Payer: Medicare Other | Admitting: *Deleted

## 2014-12-07 ENCOUNTER — Inpatient Hospital Stay (HOSPITAL_COMMUNITY): Payer: Medicare Other

## 2014-12-07 LAB — URINE CULTURE: Colony Count: >=100000

## 2014-12-07 MED ORDER — BETHANECHOL CHLORIDE 25 MG PO TABS
25.0000 mg | ORAL_TABLET | Freq: Four times a day (QID) | ORAL | Status: DC
  Administered 2014-12-07 – 2014-12-09 (×9): 25 mg via ORAL
  Filled 2014-12-07 (×13): qty 1

## 2014-12-07 MED ORDER — CIPROFLOXACIN HCL 250 MG PO TABS
250.0000 mg | ORAL_TABLET | Freq: Two times a day (BID) | ORAL | Status: AC
  Administered 2014-12-07 – 2014-12-13 (×14): 250 mg via ORAL
  Filled 2014-12-07 (×14): qty 1

## 2014-12-07 NOTE — Progress Notes (Signed)
Chaplain initiated follow up with pt and family. Pt needed to answer phone while chaplain present. Chaplain will continue to follow.   12/07/14 1600  Clinical Encounter Type  Visited With Patient and family together  Visit Type Follow-up;Spiritual support  Spiritual Encounters  Spiritual Needs Emotional  Jiles HaroldStamey, Demetrio Leighty F, Chaplain 12/07/2014 4:11 PM

## 2014-12-07 NOTE — Progress Notes (Signed)
Nursing Note: Pt unable to void.A:In and out cathed by  Tanya,NT for 650 cc.Pt tolerated well.wbb

## 2014-12-07 NOTE — Progress Notes (Signed)
Physical Therapy Session Note  Patient Details  Name: [name] Weber MRN: [id number] Date of Birth: 03/24/1931  Today's Date: 12/07/2014 PT Individual Time: 0900-1000 PT Individual Time Calculation (min): 60 min   Short Term Goals: Week 2:  PT Short Term Goal 1 (Week 2): Pt will perform bed mobility at S level from flat surface without rails PT Short Term Goal 2 (Week 2): Pt will transfer stand pivot/step with RW with min A PT Short Term Goal 3 (Week 2): Pt will be able to gait with RW x 50' with min A PT Short Term Goal 4 (Week 2): Pt will be able to go up/down 7 stairs with 1 rail with mod A  Skilled Therapeutic Interventions/Progress Updates:    Patient received sitting in wheelchair. Session focused on wheelchair mobility, functional transfers, B LE NMR, and gait training. Wheelchair mobility >150' x2 with B UE and supervision/verbal cues for improved efficiency. See details below for B LE NMR. Gait training 120' x1 with RW and modA, noted narrow BOS with intermittent scissoring, inconsistent step/stride length. Gait training 3290' x1 with RW and L LE blue theraband wrap applied for improved L hip ER and abd to normalize BOS/decrease scissoring; theraband wrap demonstrates improvements in gait.   Patient returned to room and performed wheelchair>toilet transfer with minA and use of grab bars, patient able to manage clothing with steadying assist. Patient left sitting on toilet with instruction to call when done. RN and nurse tech aware.  Therapy Documentation Precautions:  Precautions Precautions: Fall, Back Precaution Booklet Issued: No Precaution Comments: watch orthostatic BP Restrictions Weight Bearing Restrictions: No Pain: Pain Assessment Pain Score: 3  Locomotion : Ambulation Ambulation/Gait Assistance: 3: Mod assist Wheelchair Mobility Distance: 150  Other Treatments: Treatments Neuromuscular Facilitation: Right;Left;Lower Extremity;Forced use;Activity to increase  coordination;Activity to increase motor control;Activity to increase timing and sequencing;Activity to increase grading;Activity to increase sustained activation;Activity to increase lateral weight shifting;Activity to increase anterior-posterior weight shifting Weight Bearing Technique Weight Bearing Technique: Yes RUE Weight Bearing Technique: Prone;Quadruped;High kneeling LUE Weight Bearing Technique: Prone;Quadruped;High kneeling Response to Weight Bearing Technique: Quadruped with emphasis on anterior/posterior weight shifts and crawling, transitioning to prone for rest breaks. Tall kneeling with pre-gait activities: side stepping, forward/retro stepping with each LE.  See FIM for current functional status  Therapy/Group: Individual Therapy  Chipper HerbBridget S Kaedence Connelly S. Paytin Ramakrishnan, PT, DPT 12/07/2014, 12:17 PM

## 2014-12-07 NOTE — Plan of Care (Signed)
Problem: RH BOWEL ELIMINATION Goal: RH STG MANAGE BOWEL WITH ASSISTANCE STG Manage Bowel with Assistance. Mod A  Outcome: Progressing Daily supp. At 0600

## 2014-12-07 NOTE — Progress Notes (Signed)
Occupational Therapy Note  Patient Details  Name: [name] Weber MRN: [id number] Date of Birth: 05/11/1931  Today's Date: 12/07/2014 OT Individual Time: 1100-1200 OT Individual Time Calculation (min): 60 min   Pt c/o 5/10 pain initially in right hip but pt stated pain went away in right hip and then her left hip started hurting after activity; repositioned Individual Therapy  Pt initially engaged in dynamic standing tasks in therapy gym including reaching for items outside BOS and retrieving items from floor using reacher.  Pt transitioned to ADL apartment and engaged in functional amb with RW for home mgmt tasks including retrieving items from drawers and transporting to bathroom to place in dirty linen bag.  Pt required multiple rest breaks throughout session.  Pt required steady a for dynamic standing tasks and functional amb tasks.   [name] Weber, [name] Weber [facility] 12/07/2014, 12:22 PM

## 2014-12-07 NOTE — Progress Notes (Signed)
Physical Therapy Session Note  Patient Details  Name: Stephani Policevelyn C Nam MRN: [id number] Date of Birth: 05/08/1931  Today's Date: 12/07/2014 PT Individual Time: 1400-1500 PT Individual Time Calculation (min): 60 min   Short Term Goals: Week 2:  PT Short Term Goal 1 (Week 2): Pt will perform bed mobility at S level from flat surface without rails PT Short Term Goal 2 (Week 2): Pt will transfer stand pivot/step with RW with min A PT Short Term Goal 3 (Week 2): Pt will be able to gait with RW x 50' with min A PT Short Term Goal 4 (Week 2): Pt will be able to go up/down 7 stairs with 1 rail with mod A  Skilled Therapeutic Interventions/Progress Updates:  1:1. Pt received supine in bed sleeping, req mod cues to wake and pt agreeable to therapy. Focus this session on functional endurance, functional w/c propulsion and transfers, NMR in standing and gait training. Pt req supervision for t/f sup>sit EOB w/ use of bed rail and log roll technique with min cues for safety. Pt able to perform safe lateral scoot t/f bed>w/c with min cues for management of w/c parts during set up.   In hallway with use of B UE on rail, pt practiced side stepping 30'x2 to both L and R sides, sidestepping + mini squats 20'x1 to both L and R sides, negotiation up/down 12 steps with B UE single rail sideways with both L and R LE lead to target increased BOS, glute strength, proximal trunk control, quad control, proprioception and lateral weight shifting. Manual facilitation for trunk extension, posterior pelvic tilt, glute/quad control and appropriate step length size (increased cueing for R LE vs. L).    Pt amb 150'x1 with RW and mod A, emphasis on emergent awareness and maintaining increased BOS. 3lb weights used on B ankles for increased proprioceptive feedback.   Pt left sitting in w/c at end of session w/ all needs in reach.   Therapy Documentation Precautions:  Precautions Precautions: Fall, Back Precaution Booklet  Issued: No Precaution Comments: watch orthostatic BP Restrictions Weight Bearing Restrictions: No  Pain: Pain Assessment Pain Assessment: 0-10 Pain Score: 3  Pain Type: Acute pain Pain Location: Head Pain Descriptors / Indicators: Aching Pain Frequency: Rarely Pain Onset: Gradual Patients Stated Pain Goal: 2 Pain Intervention(s): Medication (See eMAR) Multiple Pain Sites: No  See FIM for current functional status  Therapy/Group: Individual Therapy  Denzil HughesKing, Shyonna Carlin S 12/07/2014, 3:34 PM

## 2014-12-07 NOTE — Progress Notes (Signed)
[name] Weber     PROGRESS NOTE    Subjective/Complaints: Feeling better but still retaining urine. Had a great day with therapy A  review of systems has been performed and if not noted above is otherwise negative.   Objective: Vital Signs: Blood pressure 111/55, pulse 76, temperature 98.7 F (37.1 C), temperature source Oral, resp. rate 16, SpO2 95 %. No results found.  Recent Labs  12/05/14 0555  WBC 3.8*  HGB 11.9*  HCT 35.9*  PLT 209    Recent Labs  12/05/14 0555  NA 136  K 3.8  CL 102  GLUCOSE 96  BUN 5*  CREATININE 0.60  CALCIUM 8.2*   CBG (last 3)  No results for input(s): GLUCAP in the last 72 hours.  Wt Readings from Last 3 Encounters:  11/17/14 59.8 kg (131 lb 13.4 oz)  11/13/14 48.535 kg (107 lb)  07/16/14 48.988 kg (108 lb)    Physical Exam:  Constitutional: She is oriented to person, place, and time. She appears well-developed.  HENT: oral mucosa moist, dentition good Head: Normocephalic and atraumatic.  Eyes: Conjunctivae are normal. Pupils are equal, round, and reactive to light.  Neck: Normal range of motion. Neck supple.  Cardiovascular: Normal rate.  Respiratory: Effort normal and breath sounds normal. No respiratory distress. She has no wheezes.  GI: Soft. Bowel sounds are normal. She exhibits no distension.  Abdomen Non-tender.  Musculoskeletal: She exhibits no edema or tenderness. No spasms or "knot" on chest wall Neurological: She is alert and oriented to person, place, and time.  Speech clear. Follows commands without difficulty. impaired proprioception/LT in both legs. Strength: 3+/5HF,KE bilaterally and 4- ADF/APF. UE's 5/5.  Skin: Skin is warm and dry. Back incision clean,dry and intact.  Psych: pleasant. Sl anxious  Assessment/Plan: 1. Functional deficits secondary to T10-12 hematoma with subsequent myelopathy and paraplegia which require 3+ hours per day of interdisciplinary therapy  in a comprehensive inpatient rehab setting. Physiatrist is providing close team supervision and 24 hour management of active medical problems listed below. Physiatrist and rehab team continue to assess barriers to discharge/monitor patient progress toward functional and medical goals. FIM: FIM - Bathing Bathing Steps Patient Completed: Chest, Right Arm, Left Arm, Front perineal area, Abdomen, Buttocks, Right upper leg, Left upper leg, Right lower leg (including foot), Left lower leg (including foot) Bathing: 5: Supervision: Safety issues/verbal cues  FIM - Upper Body Dressing/Undressing Upper body dressing/undressing steps patient completed: Thread/unthread right sleeve of pullover shirt/dresss, Thread/unthread left sleeve of pullover shirt/dress, Put head through opening of pull over shirt/dress, Pull shirt over trunk Upper body dressing/undressing: 5: Set-up assist to: Obtain clothing/put away FIM - Lower Body Dressing/Undressing Lower body dressing/undressing steps patient completed: Thread/unthread right pants leg, Thread/unthread left pants leg, Pull pants up/down, Fasten/unfasten pants Lower body dressing/undressing: 4: Min-Patient completed 75 plus % of tasks  FIM - Toileting Toileting steps completed by patient: Adjust clothing prior to toileting, Performs perineal hygiene, Adjust clothing after toileting Toileting Assistive Devices: Grab bar or rail for support Toileting: 5: Supervision: Safety issues/verbal cues  FIM - Diplomatic Services operational officerToilet Transfers Toilet Transfers Assistive Devices: Bedside commode, Grab bars, Art gallery managerWalker Toilet Transfers: 5-To toilet/BSC: Supervision (verbal cues/safety issues), 5-From toilet/BSC: Supervision (verbal cues/safety issues)  FIM - BankerBed/Chair Transfer Bed/Chair Transfer Assistive Devices: Bed rails, Arm rests Bed/Chair Transfer: 5: Supine > Sit: Supervision (verbal cues/safety issues), 4: Bed > Chair or W/C: Min A (steadying Pt. > 75%)  FIM - Locomotion:  Wheelchair Distance: 150 Locomotion: Wheelchair:  5: Travels 150 ft or more: maneuvers on rugs and over door sills with supervision, cueing or coaxing FIM - Locomotion: Ambulation Locomotion: Ambulation Assistive Devices: Designer, industrial/product Ambulation/Gait Assistance: 1: +2 Total assist, 4: Min assist, 3: Mod assist Locomotion: Ambulation: 1: Two helpers  Comprehension Comprehension Mode: Auditory Comprehension: 6-Follows complex conversation/direction: With extra time/assistive device  Expression Expression Mode: Verbal Expression: 5-Expresses complex 90% of the time/cues < 10% of the time  Social Interaction Social Interaction: 6-Interacts appropriately with others with medication or extra time (anti-anxiety, antidepressant).  Problem Solving Problem Solving: 6-Solves complex problems: With extra time  Memory Memory: 6-More than reasonable amt of time  Medical Problem List and Plan: 1. Functional deficits secondary to Weber 10-12 hematoma with myelopathy and paraplegia 2. PE/BLE DVT /Anticoagulation: Mechanical: Sequential compression devices, below knee Bilateral lower extremities No blood thinner due to thoracic hematoma--verified with Dr. Jordan Likes  3. Pain Management: Continue oxycodone prn.   -  baclofen for m/s spasms 4. Mood: LCSW to follow for evaluation and support.  5. Neuropsych: This patient is not quite capable of making decisions on her own behalf. 6. Skin/Wound Care:  7. Fluids/Electrolytes/Nutrition: Monitor I/O.  Intake improving      -supps  8. E coli UTI: suprax for 7 days 9. Neurogenic bowel: large bm yesterday  -regular bowel regimen 10. Neurogenic bladder: voiding trial underway  -Urine culture again with 100k GNR--begin empiric cipro  -increase urecholine to qid  -double voids, up to toilet to void  -on 0.8 flomaxqhs 11. Hyponatremia: Slowly improving. Stopped NaCl tabs---normal 135  12. Hypotension: Will continue florinef. Changed flomax to  bedtime to avoid drop during the day. continue abdominal binder and TEDs to help with BP support.  13. Afib: ON ASA for stroke prevention. 14. Hypokalemia:  supp,diet   LOS (Days) 8 A FACE TO FACE EVALUATION WAS PERFORMED  SWARTZ,[name] Weber 12/07/2014 7:53 AM

## 2014-12-07 NOTE — Progress Notes (Signed)
Nursing Note: Pt unable to void.A: Pt in and out cathed by Tanya,NT for 300 cc.Pt tolerated well.wbb

## 2014-12-07 NOTE — Progress Notes (Signed)
Occupational Therapy Weekly Progress Note & Session Note  Patient Details  Name: [name] Weber MRN: [id number] Date of Birth: 1931-01-23   WEEKLY PROGRESS NOTE  Beginning of progress report period: November 29, 2014 End of progress report period: December 07, 2014  Today's Date: 12/07/2014  Patient has met 5 of 5 short term goals.  Patient is making good progress on CIR. Patient has been limited by pain > L hip during the past week, but this has subsided over the past couple of days. This therapist has taught patient stretches > BLEs to help with pain management. Patient's estimated d/c date =1/19 > home at mod I level. Patient is progressing towards this and motivated to reach mod I goals.   Patient continues to demonstrate the following deficits: decreased overall strength, decreased overall endurance, decreased independence with BADLs, decreased independence with IADLs, decreased independence with sit<>stands, decreased independence with dynamic standing balance/tolerance/endurance.  Therefore, patient will continue to benefit from skilled OT intervention to enhance overall performance with BADL and iADL.  Patient progressing toward long term goals..  Plan of care revisions: upgraded goals to an overall mod I level.  OT Short Term Goals Week 1:  OT Short Term Goal 1 (Week 1): Pt will be able to don pants over feet with use of reacher. OT Short Term Goal 1 - Progress (Week 1): Met OT Short Term Goal 2 (Week 1): Pt will be able to stand from w/c with mod A to be able to pull pants over hips. OT Short Term Goal 2 - Progress (Week 1): Met OT Short Term Goal 3 (Week 1): Pt will be able to transfer to a tub bench with mod A. OT Short Term Goal 3 - Progress (Week 1): Met OT Short Term Goal 4 (Week 1): Pt will be able to transfer to a BSC drop arm with mod A x 1. OT Short Term Goal 4 - Progress (Week 1): Met OT Short Term Goal 5 (Week 1): Pt will be able to bathe feet with long handled  sponge. OT Short Term Goal 5 - Progress (Week 1): Met   Week 2:  OT Short Term Goal 1 (Week 2): Patient will perform stand pivot transfer <> BSC using grab bars vs RW with supervision OT Short Term Goal 2 (Week 2): Patient will perform shower transfer with supervision (consistantly) OT Short Term Goal 3 (Week 2): Patient will be able to perform simple meal prep with set-up assistance OT Short Term Goal 4 (Week 2): Patient will maintain dynamic standing balance with min guard assist for at least 10 minutes with one seated rest break (if needed) in prep for dynamic standing ADL tasks  Skilled Therapeutic Interventions/Progress Updates:  Balance/vestibular training;Cognitive remediation/compensation;Discharge planning;Community reintegration;DME/adaptive equipment instruction;Neuromuscular re-education;Pain management;Patient/family education;Functional mobility training;Self Care/advanced ADL retraining;Therapeutic Exercise;Therapeutic Activities;UE/LE Strength taining/ROM;UE/LE Coordination activities;Wheelchair propulsion/positioning;Psychosocial support   -----------------------------------------------------------------------------------------------------------------------  SESSION NOTE [phone] - 60 Minutes Individual Therapy Patient with 7/10 complaints of pain in left hip during certain transitional movements Patient received supine in bed stating "i've turned a new leaf, I feel better". Patient engaged in bed mobility without grimacing in pain. Patient transferred EOB> w/c with supervision. Patient then propelled self into BR for toilet transfer with min assist, performing stand pivot transfer. Patient then transferred back to w/c, then to tub transfer bench. After shower in seated position, patient transferred back to w/c for UB/LB dressing in sit<>stand position using RW and grooming tasks seated at sink. Would like to work up to  standing at sink for grooming tasks, set STG to work towards  this. At end of session, left patient seated in w/c with all needs within reach.   Patient overall supervision>min assist for transfers. STGs set for a consistent supervision level. LTGs upgraded to an overall mod I level secondary to patient's progress so far.   Therapy Documentation Precautions:  Precautions Precautions: Fall, Back Precaution Booklet Issued: No Precaution Comments: watch orthostatic BP Restrictions Weight Bearing Restrictions: No  Vital Signs: Therapy Vitals Temp: 98.7 F (37.1 C) Temp Source: Oral Pulse Rate: 76 Resp: 16 BP: (!) 111/55 mmHg (RN notified) Patient Position (if appropriate): Lying Oxygen Therapy SpO2: 95 % O2 Device: Not Delivered  See FIM for current functional status  Therapy/Group: Individual Therapy  Charvis Lightner 12/07/2014, 7:48 AM

## 2014-12-08 ENCOUNTER — Inpatient Hospital Stay (HOSPITAL_COMMUNITY): Payer: Medicare Other | Admitting: Physical Therapy

## 2014-12-08 ENCOUNTER — Inpatient Hospital Stay (HOSPITAL_COMMUNITY): Payer: Medicare Other | Admitting: Occupational Therapy

## 2014-12-08 DIAGNOSIS — G839 Paralytic syndrome, unspecified: Secondary | ICD-10-CM

## 2014-12-08 DIAGNOSIS — M4714 Other spondylosis with myelopathy, thoracic region: Secondary | ICD-10-CM

## 2014-12-08 DIAGNOSIS — B9620 Unspecified Escherichia coli [E. coli] as the cause of diseases classified elsewhere: Secondary | ICD-10-CM

## 2014-12-08 DIAGNOSIS — N390 Urinary tract infection, site not specified: Secondary | ICD-10-CM

## 2014-12-08 DIAGNOSIS — K592 Neurogenic bowel, not elsewhere classified: Secondary | ICD-10-CM

## 2014-12-08 DIAGNOSIS — N319 Neuromuscular dysfunction of bladder, unspecified: Secondary | ICD-10-CM

## 2014-12-08 MED ORDER — BACLOFEN 10 MG PO TABS
10.0000 mg | ORAL_TABLET | Freq: Four times a day (QID) | ORAL | Status: DC
  Administered 2014-12-08 – 2014-12-10 (×8): 10 mg via ORAL
  Filled 2014-12-08 (×12): qty 1

## 2014-12-08 NOTE — Progress Notes (Signed)
Occupational Therapy Session Note  Patient Details  Name: [name] Weber MRN: [id number] Date of Birth: [birth year]  Today's Date: 12/08/2014 OT Individual Time: 1300-1400 OT Individual Time Calculation (min): 60 min   Short Term Goals: Week 1:  OT Short Term Goal 1 (Week 1): Pt will be able to don pants over feet with use of reacher. OT Short Term Goal 1 - Progress (Week 1): Met OT Short Term Goal 2 (Week 1): Pt will be able to stand from w/c with mod A to be able to pull pants over hips. OT Short Term Goal 2 - Progress (Week 1): Met OT Short Term Goal 3 (Week 1): Pt will be able to transfer to a tub bench with mod A. OT Short Term Goal 3 - Progress (Week 1): Met OT Short Term Goal 4 (Week 1): Pt will be able to transfer to a BSC drop arm with mod A x 1. OT Short Term Goal 4 - Progress (Week 1): Met OT Short Term Goal 5 (Week 1): Pt will be able to bathe feet with long handled sponge. OT Short Term Goal 5 - Progress (Week 1): Met   Week 2:  OT Short Term Goal 1 (Week 2): Patient will perform stand pivot transfer <> BSC using grab bars vs RW with supervision OT Short Term Goal 2 (Week 2): Patient will perform shower transfer with supervision (consistantly) OT Short Term Goal 3 (Week 2): Patient will be able to perform simple meal prep with set-up assistance OT Short Term Goal 4 (Week 2): Patient will maintain dynamic standing balance with min guard assist for at least 10 minutes with one seated rest break (if needed) in prep for dynamic standing ADL tasks  Skilled Therapeutic Interventions/Progress Updates:  Patient received supine in bed with family members present. Patient with complaints of pain, not sleeping well last night at all, and "not feeling well". Patient complained of being cold, but felt warm to the touch. Therapist asked RN to check temp=98.3. Patient willing to work with therapist. Patient engaged in bed mobility and sat EOB for EOB>w/c squat pivot transfer (min guard  assist provided from therapist). Patient then propelled self > therapy gym. Once in gym, patient with bowel/bladder urgency. Patient propelled self > ADL apartment and completed transfer > elevated toilet seat. Patient with successful BM and urine output, NT and RN made aware of this. Patient sat at sink in w/c to wash hands and then propelled self back to room for transfer back to bed per RNs request. Patient with increased fatigue this session and didn't seem like herself, patient took more than a reasonable amount of time to complete tasks. NT stated patient more "wobbly" this morning during bathing at shower level. Therapist left sign outside patient's door stating "No visitors at this time" secondary to patient exhausted, patient aware of sign and agreed. Left patient supine in bed with all needs within reach and therapist encouraged patient to get some rest.   Precautions:  Precautions Precautions: Fall, Back Precaution Booklet Issued: No Precaution Comments: watch orthostatic BP Restrictions Weight Bearing Restrictions: No  ADL: ADL ADL Comments: Refer to FIM  See FIM for current functional status  Therapy/Group: Individual Therapy  Filimon Miranda 12/08/2014, 2:09 PM

## 2014-12-08 NOTE — Progress Notes (Signed)
PHYSICAL MEDICINE & REHABILITATION     PROGRESS NOTE    Subjective/Complaints: Still having bladder problems, muscle spasms interfered with sleep last noc, now feels drowsy with dry mouth A  review of systems has been performed and if not noted above is otherwise negative.   Objective: Vital Signs: Blood pressure 135/55, pulse 90, temperature 99.7 F (37.6 C), temperature source Oral, resp. rate 18, SpO2 95 %. No results found. No results for input(s): WBC, HGB, HCT, PLT in the last 72 hours. No results for input(s): NA, K, CL, GLUCOSE, BUN, CREATININE, CALCIUM in the last 72 hours.  Invalid input(s): CO CBG (last 3)  No results for input(s): GLUCAP in the last 72 hours.  Wt Readings from Last 3 Encounters:  11/17/14 59.8 kg (131 lb 13.4 oz)  11/13/14 48.535 kg (107 lb)  07/16/14 48.988 kg (108 lb)    Physical Exam:  Constitutional: She is oriented to person, place, and time. She appears well-developed.  HENT: oral mucosa moist, dentition good Head: Normocephalic and atraumatic.  Eyes: Conjunctivae are normal. Pupils are equal, round, and reactive to light.  Neck: Normal range of motion. Neck supple.  Cardiovascular: Normal rate.  Respiratory: Effort normal and breath sounds normal. No respiratory distress. She has no wheezes.  GI: Soft. Bowel sounds are normal. She exhibits no distension.  Abdomen Non-tender.  Musculoskeletal: She exhibits no edema or tenderness. No spasms or "knot" on chest wall Neurological: She is alert and oriented to person, place, and time.  Speech clear. Follows commands without difficulty. impaired proprioception/LT in both legs. Strength: 3+/5HF,KE bilaterally and 4- ADF/APF. UE's 5/5.  Skin: Skin is warm and dry. Back incision clean,dry and intact.  Psych: pleasant. Sl anxious  Assessment/Plan: 1. Functional deficits secondary to T10-12 hematoma with subsequent myelopathy and paraplegia which require 3+ hours per day of  interdisciplinary therapy in a comprehensive inpatient rehab setting. Physiatrist is providing close team supervision and 24 hour management of active medical problems listed below. Physiatrist and rehab team continue to assess barriers to discharge/monitor patient progress toward functional and medical goals. FIM: FIM - Bathing Bathing Steps Patient Completed: Chest, Right Arm, Left Arm, Front perineal area, Abdomen, Buttocks, Right upper leg, Left upper leg, Right lower leg (including foot), Left lower leg (including foot) Bathing: 5: Supervision: Safety issues/verbal cues  FIM - Upper Body Dressing/Undressing Upper body dressing/undressing steps patient completed: Thread/unthread right sleeve of pullover shirt/dresss, Thread/unthread left sleeve of pullover shirt/dress, Put head through opening of pull over shirt/dress, Pull shirt over trunk Upper body dressing/undressing: 5: Set-up assist to: Obtain clothing/put away FIM - Lower Body Dressing/Undressing Lower body dressing/undressing steps patient completed: Thread/unthread right pants leg, Thread/unthread left pants leg, Pull pants up/down, Fasten/unfasten pants, Don/Doff right sock, Don/Doff left sock Lower body dressing/undressing: 4: Min-Patient completed 75 plus % of tasks (sit<>stand position using RW)  FIM - Toileting Toileting steps completed by patient: Adjust clothing prior to toileting, Performs perineal hygiene, Adjust clothing after toileting Toileting Assistive Devices: Grab bar or rail for support Toileting: 4: Steadying assist  FIM - Diplomatic Services operational officerToilet Transfers Toilet Transfers Assistive Devices: Grab bars Toilet Transfers: 4-To toilet/BSC: Min A (steadying Pt. > 75%), 4-From toilet/BSC: Min A (steadying Pt. > 75%)  FIM - Bed/Chair Transfer Bed/Chair Transfer Assistive Devices: Bed rails, Arm rests Bed/Chair Transfer: 5: Supine > Sit: Supervision (verbal cues/safety issues), 5: Bed > Chair or W/C: Supervision (verbal cues/safety  issues)  FIM - Locomotion: Wheelchair Distance: 150 Locomotion: Wheelchair: 5: Travels 150 ft  or more: maneuvers on rugs and over door sills with supervision, cueing or coaxing FIM - Locomotion: Ambulation Locomotion: Ambulation Assistive Devices: Walker - Rolling Ambulation/Gait Assistance: 3: Mod assist Locomotion: Ambulation: 3: Travels 150 ft or more with moderate assistance (Pt: 50 - 74%)  Comprehension Comprehension Mode: Auditory Comprehension: 6-Follows complex conversation/direction: With extra time/assistive device  Expression Expression Mode: Verbal Expression: 6-Expresses complex ideas: With extra time/assistive device  Social Interaction Social Interaction: 6-Interacts appropriately with others with medication or extra time (anti-anxiety, antidepressant).  Problem Solving Problem Solving: 6-Solves complex problems: With extra time  Memory Memory: 6-More than reasonable amt of time  Medical Problem List and Plan: 1. Functional deficits secondary to T 10-12 hematoma with myelopathy and paraplegia 2. PE/BLE DVT /Anticoagulation: Mechanical: Sequential compression devices, below knee Bilateral lower extremities No blood thinner due to thoracic hematoma--verified with Dr. Jordan Likes  3. Pain Management: Continue oxycodone prn.   -  baclofen for m/s spasms, adjust dose 4. Mood: LCSW to follow for evaluation and support.  5. Neuropsych: This patient is not quite capable of making decisions on her own behalf. 6. Skin/Wound Care:  7. Fluids/Electrolytes/Nutrition: Monitor I/O.  Intake improving      -supps  8. Pseudomonas A UTI: cipro for 7 days 9. Neurogenic bowel: large bm yesterday  -regular bowel regimen 10. Neurogenic bladder: voiding trial underway    -increase urecholine to qid  -double voids, up to toilet to void  -on 0.8 flomaxqhs 11. Hyponatremia: Slowly improving. Stopped NaCl tabs---normal 135  12. Hypotension: Will continue florinef. Changed  flomax to bedtime to avoid drop during the day. continue abdominal binder and TEDs to help with BP support.  13. Afib: ON ASA for stroke prevention. 14. Hypokalemia:  supp,diet   LOS (Days) 9 A FACE TO FACE EVALUATION WAS PERFORMED  Erick Colace 12/08/2014 9:33 AM

## 2014-12-08 NOTE — Plan of Care (Signed)
Problem: RH PAIN MANAGEMENT Goal: RH STG PAIN MANAGED AT OR BELOW PT'S PAIN GOAL <5  Outcome: Not Progressing Rates pain 8/10     

## 2014-12-08 NOTE — Progress Notes (Signed)
Physical Therapy Session Note  Patient Details  Name: [name] Weber MRN: [id number] Date of Birth: 05/07/1931  Today's Date: 12/08/2014 PT Individual Time: 1100-1200 PT Individual Time Calculation (min): 60 min   Short Term Goals: Week 1:  PT Short Term Goal 1 (Week 1): Pt will perform bed mobility with Min A using rolling technique. PT Short Term Goal 1 - Progress (Week 1): Met PT Short Term Goal 2 (Week 1): Pt will perform sliding board transfer with Mod A  PT Short Term Goal 2 - Progress (Week 1): Met PT Short Term Goal 3 (Week 1): Pt will perform dynamic sitting balance x5 min with close S PT Short Term Goal 3 - Progress (Week 1): Met PT Short Term Goal 4 (Week 1): Pt will ambualte 25' with assist of 1 and RW PT Short Term Goal 4 - Progress (Week 1): Partly met (+1 physical A but requires second person for w/c follow for safety) PT Short Term Goal 5 (Week 1): Pt will stand x5 min during functional task with min A PT Short Term Goal 5 - Progress (Week 1): Met  Skilled Therapeutic Interventions/Progress Updates:  Pt was seen bedside in the am. Pt stated she didn't sleep well last night but willing to participate with therapy. Pt propelled w/c to gym with B UEs and S about 150 feet. Pt performed multiple transfers sit to stand during with S to min guard and occasional verbal cues. Treatment in gym focused on NMR and LE strengthening, performed cone taps, alternating cone taps, criss cross cone taps, step up and mini squats. Pt ambulated about 140 feet with rolling walker and min A with verbal cues. Pt propelled w/c back to room with B UEs and S.   Therapy Documentation Precautions:  Precautions Precautions: Fall, Back Precaution Booklet Issued: No Precaution Comments: watch orthostatic BP Restrictions Weight Bearing Restrictions: No General:   Pain: No c/o pain.   Ambulation Ambulation/Gait Assistance: 4: Min assist   See FIM for current functional status  Therapy/Group:  Individual Therapy  ,  G 12/08/2014, 1:26 PM  

## 2014-12-09 ENCOUNTER — Inpatient Hospital Stay (HOSPITAL_COMMUNITY): Payer: Medicare Other

## 2014-12-09 MED ORDER — BETHANECHOL CHLORIDE 25 MG PO TABS: 25.0000 mg | ORAL_TABLET | Freq: Three times a day (TID) | ORAL | Status: DC

## 2014-12-09 NOTE — Progress Notes (Signed)
[facility] PHYSICAL MEDICINE & REHABILITATION     PROGRESS NOTE    Subjective/Complaints: Urinary retention, swelling, started on Urecholine 2 days ago UTI is sensitive to Cipro, occasional low-grade temp A  review of systems has been performed and if not noted above is otherwise negative.   Objective: Vital Signs: Blood pressure 127/65, pulse 87, temperature 98.3 F (36.8 C), temperature source Oral, resp. rate 17, SpO2 93 %. No results found. No results for input(s): WBC, HGB, HCT, PLT in the last 72 hours. No results for input(s): NA, K, CL, GLUCOSE, BUN, CREATININE, CALCIUM in the last 72 hours.  Invalid input(s): CO CBG (last 3)  No results for input(s): GLUCAP in the last 72 hours.  Wt Readings from Last 3 Encounters:  11/17/14 59.8 kg (131 lb 13.4 oz)  11/13/14 48.535 kg (107 lb)  07/16/14 48.988 kg (108 lb)    Physical Exam:  Constitutional: She is oriented to person, place, and time. She appears well-developed.  HENT: oral mucosa moist, dentition good Head: Normocephalic and atraumatic.  Eyes: Conjunctivae are normal. Pupils are equal, round, and reactive to light.  Neck: Normal range of motion. Neck supple.  Cardiovascular: Normal rate.  Respiratory: Effort normal and breath sounds normal. No respiratory distress. She has no wheezes.  GI: Soft. Bowel sounds are normal. She exhibits no distension.  Abdomen Non-tender.  Musculoskeletal: She exhibits no edema or tenderness. No spasms or "knot" on chest wall Neurological: She is alert and oriented to person, place, and time.  Speech clear. Follows commands without difficulty. impaired proprioception/LT in both legs. Strength: 3+/5HF,KE bilaterally and 4- ADF/APF. UE's 5/5.  Skin: Skin is warm and dry. Back incision clean,dry and intact.  Psych: pleasant. Sl anxious  Assessment/Plan: 1. Functional deficits secondary to T10-12 hematoma with subsequent myelopathy and paraplegia which require 3+ hours per  day of interdisciplinary therapy in a comprehensive inpatient rehab setting. Physiatrist is providing close team supervision and 24 hour management of active medical problems listed below. Physiatrist and rehab team continue to assess barriers to discharge/monitor patient progress toward functional and medical goals. FIM: FIM - Bathing Bathing Steps Patient Completed: Chest, Right Arm, Left Arm, Front perineal area, Abdomen, Buttocks, Right upper leg, Left upper leg, Right lower leg (including foot), Left lower leg (including foot) Bathing: 5: Supervision: Safety issues/verbal cues  FIM - Upper Body Dressing/Undressing Upper body dressing/undressing steps patient completed: Thread/unthread right sleeve of pullover shirt/dresss, Thread/unthread left sleeve of pullover shirt/dress, Put head through opening of pull over shirt/dress, Pull shirt over trunk Upper body dressing/undressing: 5: Set-up assist to: Obtain clothing/put away FIM - Lower Body Dressing/Undressing Lower body dressing/undressing steps patient completed: Thread/unthread right pants leg, Thread/unthread left pants leg, Pull pants up/down, Fasten/unfasten pants, Don/Doff right sock, Don/Doff left sock Lower body dressing/undressing: 4: Min-Patient completed 75 plus % of tasks (sit<>stand position using RW)  FIM - Toileting Toileting steps completed by patient: Adjust clothing prior to toileting, Performs perineal hygiene, Adjust clothing after toileting Toileting Assistive Devices: Grab bar or rail for support Toileting: 4: Steadying assist  FIM - Diplomatic Services operational officerToilet Transfers Toilet Transfers Assistive Devices: Grab bars Toilet Transfers: 4-To toilet/BSC: Min A (steadying Pt. > 75%), 4-From toilet/BSC: Min A (steadying Pt. > 75%)  FIM - Bed/Chair Transfer Bed/Chair Transfer Assistive Devices: Bed rails, Arm rests Bed/Chair Transfer: 5: Supine > Sit: Supervision (verbal cues/safety issues), 5: Bed > Chair or W/C: Supervision (verbal  cues/safety issues)  FIM - Locomotion: Wheelchair Distance: 150 Locomotion: Wheelchair: 5: Travels 150 ft  or more: maneuvers on rugs and over door sills with supervision, cueing or coaxing FIM - Locomotion: Ambulation Locomotion: Ambulation Assistive Devices: Walker - Rolling Ambulation/Gait Assistance: 4: Min assist Locomotion: Ambulation: 2: Travels 50 - 149 ft with minimal assistance (Pt.>75%)  Comprehension Comprehension Mode: Auditory Comprehension: 5-Follows basic conversation/direction: With extra time/assistive device  Expression Expression Mode: Verbal Expression: 5-Expresses basic 90% of the time/requires cueing < 10% of the time.  Social Interaction Social Interaction: 1-Interacts appropriately less than 25% of the time. May be withdrawn or combative. (drowsy this evening)  Problem Solving Problem Solving: 2-Solves basic 25 - 49% of the time - needs direction more than half the time to initiate, plan or complete simple activities  Memory Memory: 1-Recognizes or recalls less than 25% of the time/requires cueing greater than 75% of the time (confused tonight)  Medical Problem List and Plan: 1. Functional deficits secondary to T 10-12 hematoma with myelopathy and paraplegia 2. PE/BLE DVT /Anticoagulation: Mechanical: Sequential compression devices, below knee Bilateral lower extremities No blood thinner due to thoracic hematoma--verified with Dr. Jordan Likes  3. Pain Management: Continue oxycodone prn.   -  baclofen for m/s spasms, adjust dose 4. Mood: LCSW to follow for evaluation and support.  5. Neuropsych: This patient is not quite capable of making decisions on her own behalf. 6. Skin/Wound Care:  7. Fluids/Electrolytes/Nutrition: Monitor I/O.  Intake improving      -supps  8. Pseudomonas A UTI: cipro for 7 days 9. Neurogenic bowel: large bm yesterday  -regular bowel regimen 10. Neurogenic bladder: voiding trial underway    -DC Urecholine, not responding to  this and also having side effects.  -double voids, up to toilet to void  -on 0.8 flomaxqhs 11. Hyponatremia: Slowly improving. Stopped NaCl tabs---normal 135  12. Hypotension: Will continue florinef. Changed flomax to bedtime to avoid drop during the day. continue abdominal binder and TEDs to help with BP support.  13. Afib: ON ASA for stroke prevention. 14. Hypokalemia:  supp,diet   LOS (Days) 10 A FACE TO FACE EVALUATION WAS PERFORMED  Erick Colace 12/09/2014 9:29 AM

## 2014-12-09 NOTE — Progress Notes (Signed)
At 2100 incontinent of urine, bladder scan =449, I & O cath=550. Patient with poor appetite. Low grade temps. Drowsy at times. A & O to self only.  [name] MartinezMurray, [name] Weber A

## 2014-12-09 NOTE — Progress Notes (Signed)
Occupational Therapy Session Note  Patient Details  Name: [name] Weber MRN: [id number] Date of Birth: 04/02/1931  Today's Date: 12/09/2014 OT Individual Time: 1105-1105 OT Individual Time Calculation (min): 0 min    Short Term Goals: Week 2:  OT Short Term Goal 1 (Week 2): Patient will perform stand pivot transfer <> BSC using grab bars vs RW with supervision OT Short Term Goal 2 (Week 2): Patient will perform shower transfer with supervision (consistantly) OT Short Term Goal 3 (Week 2): Patient will be able to perform simple meal prep with set-up assistance OT Short Term Goal 4 (Week 2): Patient will maintain dynamic standing balance with min guard assist for at least 10 minutes with one seated rest break (if needed) in prep for dynamic standing ADL tasks  Skilled Therapeutic Interventions/Progress Updates: Pt received asleep in bed but aroused with moderate environmental stimulation.   Pt became alert and responsive to therapist but reported ongoing symptoms of fever/fatigue with inability to tolerate planned activity or any disruption of rest.   Pt cancelled session.  RN notified.      Therapy Documentation Precautions:  Precautions Precautions: Fall, Back Precaution Booklet Issued: No Precaution Comments: watch orthostatic BP Restrictions Weight Bearing Restrictions: No   General: General OT Amount of Missed Time: 60 Minutes  C/o symptoms of fever/fatigue  Vital Signs: Therapy Vitals Temp: 97.9 F (36.6 C) Pulse Rate: 91 Resp: 18 BP: 137/61 mmHg Oxygen Therapy SpO2: 96 %   Pain: Pain Assessment Pain Score: Asleep Pain Type: Acute pain Pain Location: Abdomen Pain Orientation: Right Pain Descriptors / Indicators: Aching Pain Intervention(s): Medication (See eMAR)   ADL: ADL ADL Comments: Refer to FIM  See FIM for current functional status  Therapy/Group: Individual Therapy  Oren Barella 12/09/2014, 11:26 AM

## 2014-12-10 ENCOUNTER — Inpatient Hospital Stay (HOSPITAL_COMMUNITY): Payer: Medicare Other

## 2014-12-10 ENCOUNTER — Encounter (HOSPITAL_COMMUNITY): Payer: Medicare Other

## 2014-12-10 DIAGNOSIS — F411 Generalized anxiety disorder: Secondary | ICD-10-CM

## 2014-12-10 HISTORY — DX: Generalized anxiety disorder: F41.1

## 2014-12-10 LAB — CBC WITH DIFFERENTIAL/PLATELET
Basophils Absolute: 0 10*3/uL (ref 0.0–0.1)
Basophils Relative: 0 % (ref 0–1)
Eosinophils Absolute: 0.5 10*3/uL (ref 0.0–0.7)
Eosinophils Relative: 11 % — ABNORMAL HIGH (ref 0–5)
HCT: 38.1 % (ref 36.0–46.0)
Hemoglobin: 12.5 g/dL (ref 12.0–15.0)
Lymphocytes Relative: 11 % — ABNORMAL LOW (ref 12–46)
Lymphs Abs: 0.5 10*3/uL — ABNORMAL LOW (ref 0.7–4.0)
MCH: 32.1 pg (ref 26.0–34.0)
MCHC: 32.8 g/dL (ref 30.0–36.0)
MCV: 97.7 fL (ref 78.0–100.0)
Monocytes Absolute: 0.5 10*3/uL (ref 0.1–1.0)
Monocytes Relative: 9 % (ref 3–12)
Neutro Abs: 3.4 10*3/uL (ref 1.7–7.7)
Neutrophils Relative %: 69 % (ref 43–77)
Platelets: 217 10*3/uL (ref 150–400)
RBC: 3.9 MIL/uL (ref 3.87–5.11)
RDW: 12.4 % (ref 11.5–15.5)
WBC: 4.9 10*3/uL (ref 4.0–10.5)

## 2014-12-10 LAB — BASIC METABOLIC PANEL
Anion gap: 3 — ABNORMAL LOW (ref 5–15)
BUN: <5 mg/dL — ABNORMAL LOW (ref 6–23)
CO2: 32 mmol/L (ref 19–32)
Calcium: 8.3 mg/dL — ABNORMAL LOW (ref 8.4–10.5)
Chloride: 98 mEq/L (ref 96–112)
Creatinine, Ser: 0.65 mg/dL (ref 0.50–1.10)
GFR calc Af Amer: >90 mL/min (ref >90)
GFR calc non Af Amer: 81 mL/min — ABNORMAL LOW (ref >90)
Glucose, Bld: 112 mg/dL — ABNORMAL HIGH (ref 70–99)
Potassium: 3.5 mmol/L (ref 3.5–5.1)
Sodium: 133 mmol/L — ABNORMAL LOW (ref 135–145)

## 2014-12-10 LAB — DIGOXIN LEVEL: Digoxin Level: 0.9 ng/mL (ref 0.8–2.0)

## 2014-12-10 MED ORDER — ALPRAZOLAM 0.25 MG PO TABS
0.2500 mg | ORAL_TABLET | Freq: Three times a day (TID) | ORAL | Status: DC | PRN
  Administered 2014-12-10: 0.25 mg via ORAL
  Filled 2014-12-10: qty 1

## 2014-12-10 MED ORDER — BACLOFEN 5 MG HALF TABLET
5.0000 mg | ORAL_TABLET | Freq: Four times a day (QID) | ORAL | Status: DC | PRN
  Administered 2014-12-14 – 2014-12-16 (×4): 5 mg via ORAL
  Filled 2014-12-10 (×6): qty 1

## 2014-12-10 NOTE — Progress Notes (Signed)
Woke yelling, anxious, and tearful. "I feel like something's wrong."  Reports feeling anxious.  Denies pain. Back incision OTA, no redness or drainage. Small bulge at proximal end of incision, not new. Alfredo MartinezMurray, Jodeen Mclin A

## 2014-12-10 NOTE — Progress Notes (Signed)
Occupational Therapy Note  Patient Details  Name: [name] Weber MRN: [id number] Date of Birth: 03/13/1931  Today's Date: 12/10/2014 OT Individual Time: 1330-1430 OT Individual Time Calculation (min): 60 min   Pt c/o 4/10 pain in bilateral hips; RN aware and repositioned Individual Therapy  Pt resting in bed upon arrival with sister and brother-in-law present to observe therapy.  Pt engaged in tub bench transfers, bed mobility, functional amb with RW, and dynamic standing tasks.  Pt required steady A during all tasks.  Recommended tub transfer bench for use at home.  Family in agreement.  Pt continues to require min verbal cue for safety awareness during transitional movements.   [name] Weber, [name] Weber [facility] Laser CenterChappell 12/10/2014, 3:04 PM

## 2014-12-10 NOTE — Progress Notes (Signed)
More oriented and awake tonight. Per report, I & O at 1900=[id number] and with multi stools today. Scheduled supp. given at 0640. Tearful at times, "I feel like I'm getting worse."  Resting better tonight. Requested to leave SCD's off. At 2340 spot check bladder scan=275. At 0115, I & O cath=500. No urge to void. No incontinence of B& B thus far on this shift.Alfredo MartinezMurray, Murel Shenberger A

## 2014-12-10 NOTE — Progress Notes (Signed)
Occupational Therapy Session Note  Patient Details  Name: [name] Weber MRN: [id number] Date of Birth: 08/13/1931  Today's Date: 12/10/2014 OT Individual Time: 0900-1000 OT Individual Time Calculation (min): 60 min    Short Term Goals: Week 2:  OT Short Term Goal 1 (Week 2): Patient will perform stand pivot transfer <> BSC using grab bars vs RW with supervision OT Short Term Goal 2 (Week 2): Patient will perform shower transfer with supervision (consistantly) OT Short Term Goal 3 (Week 2): Patient will be able to perform simple meal prep with set-up assistance OT Short Term Goal 4 (Week 2): Patient will maintain dynamic standing balance with min guard assist for at least 10 minutes with one seated rest break (if needed) in prep for dynamic standing ADL tasks  Skilled Therapeutic Interventions/Progress Updates:    Pt resting in w/c upon arrival.  Pt declined bathing and dressing tasks this morning stating that she didn't think she had enough energy to complete tasks this morning.  Pt agreeable to participating in therapy in ADL apartment and therapy gym.  Pt propelled w/c to ADL apartment and practiced tub bench transfers. Pt requiring steady A with functional amb with RW and transfers this morning.  Pt engaged in functional amb with RW for home mgmt tasks.  Pt transitioned to therapy gym and practiced bed mobility on elevated surface to simulate bed height at home.  Pt also engaged in dynamic standing tasks before amb with RW back to room and returned to w/c.  Focus on activity tolerance, sit<>stand, standing balance, functional amb with RW, and safety awareness.  Pt exhibited some difficulty formulating sentences and completing sentences this morning.  Therapy Documentation Precautions:  Precautions Precautions: Fall, Back Precaution Booklet Issued: No Precaution Comments: watch orthostatic BP Restrictions Weight Bearing Restrictions: No Pain:   ADL: ADL ADL Comments: Refer to  FIM  See FIM for current functional status  Therapy/Group: Individual Therapy  [name], [name] 12/10/2014, 10:31 AM

## 2014-12-10 NOTE — Plan of Care (Signed)
LTG for gait distance upgraded for controlled and home environment due to progress.  [name] AspenAlison B Armiyah Weber, PT, DPT

## 2014-12-10 NOTE — Progress Notes (Signed)
Social Work Patient ID: [name] Weber, female   DOB: 05/25/1931, 81 y.o.   MRN: [id number]  Met with pt's son and daughter-in-law this morning at their request.  They have concerns about pt's cognitive and emotional decline over the past few days.  Daughter-in-law states, "we're just concerned something medical is going on..."  Explained to them that I would alert MD/ PA to their concerns.  Algis Liming, PA to follow up with them today.  Dr. Naaman Plummer aware as well.  Will also place pt on schedule to be seen by neuropsychology on Wed as MD feels there is a significant anxiety component at play here.  Nichael Ehly, LCSW

## 2014-12-10 NOTE — Progress Notes (Signed)
[place] PHYSICAL MEDICINE & REHABILITATION     PROGRESS NOTE    Subjective/Complaints: "i can't feel my heart beat, occasionally sob, i feel like something's wrong!" still requiring I/O caths A  review of systems has been performed and if not noted above is otherwise negative.   Objective: Vital Signs: Blood pressure 136/67, pulse 82, temperature 98.8 F (37.1 C), temperature source Oral, resp. rate 17, SpO2 95 %. No results found. No results for input(s): WBC, HGB, HCT, PLT in the last 72 hours. No results for input(s): NA, K, CL, GLUCOSE, BUN, CREATININE, CALCIUM in the last 72 hours.  Invalid input(s): CO CBG (last 3)  No results for input(s): GLUCAP in the last 72 hours.  Wt Readings from Last 3 Encounters:  11/17/14 59.8 kg (131 lb 13.4 oz)  11/13/14 48.535 kg (107 lb)  07/16/14 48.988 kg (108 lb)    Physical Exam:  Constitutional: She is oriented to person, place, and time. She appears well-developed.  HENT: oral mucosa moist, dentition good Head: Normocephalic and atraumatic.  Eyes: Conjunctivae are normal. Pupils are equal, round, and reactive to light.  Neck: Normal range of motion. Neck supple.  Cardiovascular: Normal rate.  Respiratory: Effort normal and breath sounds normal. No respiratory distress. She has no wheezes.  GI: Soft. Bowel sounds are normal. She exhibits no distension.  Abdomen Non-tender.  Musculoskeletal: She exhibits no edema or tenderness. No spasms or "knot" on chest wall Neurological: She is alert and oriented to person, place, and time.  Speech clear. Follows commands without difficulty. impaired proprioception/LT in both legs. Strength: 3+/5HF,KE bilaterally and 4- ADF/APF. UE's 5/5.  Skin: Skin is warm and dry. Back incision clean,dry and intact.  Psych: pleasant. Sl anxious  Assessment/Plan: 1. Functional deficits secondary to T10-12 hematoma with subsequent myelopathy and paraplegia which require 3+ hours per day of  interdisciplinary therapy in a comprehensive inpatient rehab setting. Physiatrist is providing close team supervision and 24 hour management of active medical problems listed below. Physiatrist and rehab team continue to assess barriers to discharge/monitor patient progress toward functional and medical goals. FIM: FIM - Bathing Bathing Steps Patient Completed: Chest, Right Arm, Left Arm, Front perineal area, Abdomen, Buttocks, Right upper leg, Left upper leg, Right lower leg (including foot), Left lower leg (including foot) Bathing: 5: Supervision: Safety issues/verbal cues  FIM - Upper Body Dressing/Undressing Upper body dressing/undressing steps patient completed: Thread/unthread right sleeve of pullover shirt/dresss, Thread/unthread left sleeve of pullover shirt/dress, Put head through opening of pull over shirt/dress, Pull shirt over trunk Upper body dressing/undressing: 5: Set-up assist to: Obtain clothing/put away FIM - Lower Body Dressing/Undressing Lower body dressing/undressing steps patient completed: Thread/unthread right pants leg, Thread/unthread left pants leg, Pull pants up/down, Fasten/unfasten pants, Don/Doff right sock, Don/Doff left sock Lower body dressing/undressing: 4: Min-Patient completed 75 plus % of tasks (sit<>stand position using RW)  FIM - Toileting Toileting steps completed by patient: Adjust clothing prior to toileting, Performs perineal hygiene, Adjust clothing after toileting Toileting Assistive Devices: Grab bar or rail for support Toileting: 4: Steadying assist  FIM - Diplomatic Services operational officer Devices: Grab bars Toilet Transfers: 4-To toilet/BSC: Min A (steadying Pt. > 75%), 4-From toilet/BSC: Min A (steadying Pt. > 75%)  FIM - Bed/Chair Transfer Bed/Chair Transfer Assistive Devices: Bed rails, Arm rests Bed/Chair Transfer: 5: Supine > Sit: Supervision (verbal cues/safety issues), 5: Bed > Chair or W/C: Supervision (verbal cues/safety  issues)  FIM - Locomotion: Wheelchair Distance: 150 Locomotion: Wheelchair: 5: Travels 150 ft  or more: maneuvers on rugs and over door sills with supervision, cueing or coaxing FIM - Locomotion: Ambulation Locomotion: Ambulation Assistive Devices: Walker - Rolling Ambulation/Gait Assistance: 4: Min assist Locomotion: Ambulation: 2: Travels 50 - 149 ft with minimal assistance (Pt.>75%)  Comprehension Comprehension Mode: Auditory Comprehension: 5-Follows basic conversation/direction: With extra time/assistive device  Expression Expression Mode: Verbal Expression: 5-Expresses basic 90% of the time/requires cueing < 10% of the time.  Social Interaction Social Interaction: 6-Interacts appropriately with others with medication or extra time (anti-anxiety, antidepressant).  Problem Solving Problem Solving: 5-Solves basic problems: With no assist  Memory Memory: 6-More than reasonable amt of time (confused tonight)  Medical Problem List and Plan: 1. Functional deficits secondary to T 10-12 hematoma with myelopathy and paraplegia 2. PE/BLE DVT /Anticoagulation: Mechanical: Sequential compression devices, below knee Bilateral lower extremities No blood thinner due to thoracic hematoma--verified with Dr. Jordan LikesPool  3. Pain Management: Continue oxycodone prn.   -  baclofen for m/s spasms, adjust dose 4. Mood: LCSW to follow for evaluation and support.   -believe anxiety is a factor. Added low dose xanax prn   -pt agrees that this is likely a problem 5. Neuropsych: This patient is not quite capable of making decisions on her own behalf. 6. Skin/Wound Care:  7. Fluids/Electrolytes/Nutrition: Monitor I/O.  Intake improving      -supps  8. Pseudomonas A UTI: cipro for 7 days 9. Neurogenic bowel: large bm yesterday  -regular bowel regimen 10. Neurogenic bladder: i/o cath prn  -urecholine stopped  -double voids, up to toilet to void  -on 0.8 flomaxqhs  -rx uti 11. Hyponatremia:   --normal 135  12. Hypotension: Will continue florinef.   -binder,teds  -hs flomax  13. Afib: ON ASA for stroke prevention. 14. Hypokalemia:  supp,diet   LOS (Days) 11 A FACE TO FACE EVALUATION WAS PERFORMED  Owen Pagnotta T 12/10/2014 7:39 AM

## 2014-12-10 NOTE — Progress Notes (Signed)
Physical Therapy Session Note  Patient Details  Name: [name] Weber MRN: [id number] Date of Birth: 11/15/1931  Today's Date: 12/10/2014 PT Individual Time: 0800-0900 PT Individual Time Calculation (min): 60 min   Short Term Goals: Week 2:  PT Short Term Goal 1 (Week 2): Pt will perform bed mobility at S level from flat surface without rails PT Short Term Goal 2 (Week 2): Pt will transfer stand pivot/step with RW with min A PT Short Term Goal 3 (Week 2): Pt will be able to gait with RW x 50' with min A PT Short Term Goal 4 (Week 2): Pt will be able to go up/down 7 stairs with 1 rail with mod A  Skilled Therapeutic Interventions/Progress Updates:   Pt with incontinent urine episode in brief in the bed with RN present. Pt reports maybe needing to go more. Focused on transfers OOB to w/c and then from w/c <-> toilet including sit to stand and standing balance for clothing management. Overall min A needed and cues for safety. Once brief and pants donned (pt stood at sink and required A to fully get pants pulled up in this position), assisted pt with donning shoes. W/c mobility on unit with S down and back from therapy gym with intermittent cueing for safety (obstacle negotiation). Worked on sit to stands (cues for hand placement and technique) with min A and gait training with RW with min A x 50' x 2 total. Pt request seated break due to feeling too "wobbly" this morning. Pt also reports feeling not like her self and became emotional during session. Decreased memory and safety noted and discussed option to meet with Neuropsych and pt in agreement. Expressed concerns with CSW as well. Neuro re-ed for coordination and motor control with toe taps to visual target on stairs and to address balance and postural control. Stair negotiation with bilateral rails x 6 with min A and cues for technique - demonstrated and gave verbal cueing and still required cueing for safety (pt missing an entire step and  unaware).   Continue to monitor cognition and safety awareness as it appears to have decreased since this therapist last worked with this patient. Notified next therapist, OT as well.   Therapy Documentation Precautions:  Precautions Precautions: Fall, Back Precaution Booklet Issued: No Precaution Comments: watch orthostatic BP Restrictions Weight Bearing Restrictions: No Pain:  Premedicated for back pain.  Locomotion : Ambulation Ambulation/Gait Assistance: 4: Min assist    See FIM for current functional status  Therapy/Group: Individual Therapy  [name], Aydin Cavalieri [facility] 12/10/2014, 11:09 AM

## 2014-12-10 NOTE — Plan of Care (Signed)
Problem: SCI BLADDER ELIMINATION Goal: RH STG MANAGE BLADDER WITH ASSISTANCE STG Manage Bladder With Mod.Assistance  Outcome: Not Progressing Incontinent at times. Goal: RH STG MANAGE BLADDER WITH EQUIPMENT WITH ASSISTANCE STG Manage Bladder With Equipment With Mod.Assistance  Outcome: Not Progressing Total assist to insert supp. Incontinent of stool.

## 2014-12-10 NOTE — Progress Notes (Signed)
Chaplain initiated follow up with pt. Pt appreciative of chaplain visit. Pt expressed difficulty in "feeling like she is going backwards" and said she has a "few crying spells." Chaplain encouraged pt to page chaplain when she is feeling this way, and that her emotional needs are important to the chaplain. Chaplain will continue to follow. Page chaplain as needed.    12/10/14 1600  Clinical Encounter Type  Visited With Patient  Visit Type Follow-up;Spiritual support  Spiritual Encounters  Spiritual Needs Emotional  Stress Factors  Patient Stress Factors Major life changes  Tonny Isensee, Mayer MaskerCourtney F, Chaplain 12/10/2014 4:18 PM

## 2014-12-10 NOTE — Progress Notes (Signed)
Physical Therapy Session Note  Patient Details  Name: [name] Weber MRN: [id number] Date of Birth: 07/22/1931  Today's Date: 12/10/2014 PT Individual Time: 1500-1600 PT Individual Time Calculation (min): 60 min   Short Term Goals: Week 2:  PT Short Term Goal 1 (Week 2): Pt will perform bed mobility at S level from flat surface without rails PT Short Term Goal 2 (Week 2): Pt will transfer stand pivot/step with RW with min A PT Short Term Goal 3 (Week 2): Pt will be able to gait with RW x 50' with min A PT Short Term Goal 4 (Week 2): Pt will be able to go up/down 7 stairs with 1 rail with mod A  Skilled Therapeutic Interventions/Progress Updates:    Pt received seated in w/c, agreeable to participate in therapy. Noted pt with mild word finding and memory impairments, very tangential and moderately redirectable this PM. Session focused on point to point ambulation, standing balance, endurance and strengthening. Pt propelled w/c 150' to rehab gym w/ SBA for route finding and occasional obstacle negotiation. Pt ambulated 50'x2, 75'x2 from w/c to arm chair in controlled environment w/ RW and overall Min Guard A, no LOB or knee buckling noted. Also ambulated 20'x2 then 40' w/ L HHA and MinA, noted increased scissoring with gait with HHA but no LOB. Pt completed 10' on Nustep on L4 progressing to L6 w/ LE only for general strengthening and endurance. Session ended in pt's room, where pt was left seated in w/c w/ all needs within reach.    Therapy Documentation Precautions:  Precautions Precautions: Fall, Back Precaution Booklet Issued: No Precaution Comments: watch orthostatic BP Restrictions Weight Bearing Restrictions: No Pain:  No/denies pain  See FIM for current functional status  Therapy/Group: Individual Therapy  [name] Weber, [name] Weber  [name] Weber, PT, DPT 12/10/2014, 7:50 AM

## 2014-12-11 ENCOUNTER — Inpatient Hospital Stay (HOSPITAL_COMMUNITY): Payer: Medicare Other

## 2014-12-11 ENCOUNTER — Encounter (HOSPITAL_COMMUNITY): Payer: Medicare Other

## 2014-12-11 ENCOUNTER — Inpatient Hospital Stay (HOSPITAL_COMMUNITY): Payer: Medicare Other | Admitting: Occupational Therapy

## 2014-12-11 ENCOUNTER — Inpatient Hospital Stay (HOSPITAL_COMMUNITY): Payer: Medicare Other | Admitting: *Deleted

## 2014-12-11 ENCOUNTER — Telehealth: Payer: Self-pay | Admitting: *Deleted

## 2014-12-11 ENCOUNTER — Other Ambulatory Visit: Payer: Self-pay | Admitting: Cardiology

## 2014-12-11 MED ORDER — ALPRAZOLAM 0.25 MG PO TABS
0.2500 mg | ORAL_TABLET | Freq: Every day | ORAL | Status: DC
  Administered 2014-12-11 – 2014-12-16 (×5): 0.25 mg via ORAL
  Filled 2014-12-11 (×6): qty 1

## 2014-12-11 NOTE — Progress Notes (Signed)
Occupational Therapy Session Note  Patient Details  Name: [name] Weber MRN: [id number] Date of Birth: 03-04-1931  Today's Date: 12/11/2014 OT Individual Time: 1941-7408 OT Individual Time Calculation (min): 40 min    Short Term Goals: Week 1:  OT Short Term Goal 1 (Week 1): Pt will be able to don pants over feet with use of reacher. OT Short Term Goal 1 - Progress (Week 1): Met OT Short Term Goal 2 (Week 1): Pt will be able to stand from w/c with mod A to be able to pull pants over hips. OT Short Term Goal 2 - Progress (Week 1): Met OT Short Term Goal 3 (Week 1): Pt will be able to transfer to a tub bench with mod A. OT Short Term Goal 3 - Progress (Week 1): Met OT Short Term Goal 4 (Week 1): Pt will be able to transfer to a BSC drop arm with mod A x 1. OT Short Term Goal 4 - Progress (Week 1): Met OT Short Term Goal 5 (Week 1): Pt will be able to bathe feet with long handled sponge. OT Short Term Goal 5 - Progress (Week 1): Met  Skilled Therapeutic Interventions/Progress Updates:    Pt seen for make-up session from missed therapy minutes in AM. Therapy session focused on functional mobility, activity tolerance, and standing balance. Pt received supine in bed agreeable to therapy. Pt completed supine>sit using log roll technique at supervision level. BP in sitting 119/54. Completed sit>stand and BP 111/56 in standing. Pt agreeable to continue therapy and very motivated. Pt propelled self to therapy gym using BUEs for activity tolerance. Engaged in functional mobility with use of RW and min A 80', 25' and 100' with no instances of knees buckling. Pt returned to room and left sitting in w/c to await next therapy session.   Therapy Documentation Precautions:  Precautions Precautions: Fall, Back Precaution Booklet Issued: No Precaution Comments: watch orthostatic BP Restrictions Weight Bearing Restrictions: No General: General OT Amount of Missed Time:  (60) Vital Signs: Therapy  Vitals Pulse Rate: 78 BP: 120/62 mmHg Patient Position (if appropriate): Lying Pain: Pain Assessment Pain Location: Pelvis ADL: ADL ADL Comments: Refer to FIM Exercises:   Other Treatments:    See FIM for current functional status  Therapy/Group: Individual Therapy  Duayne Cal 12/11/2014, 2:51 PM

## 2014-12-11 NOTE — Progress Notes (Signed)
D: Pt felt full,urine.Scanned for 600 cc. Pt up to the bathroom and voided but still felt full.Pt re-scanned for >200 cc but volume on scan visibly much larger.R: Pt in and out cathed by this nurse for 400cc.Pt states she feels "much better".Pt tolerated well.wbb

## 2014-12-11 NOTE — Plan of Care (Signed)
Problem: RH BOWEL ELIMINATION Goal: RH STG MANAGE BOWEL WITH ASSISTANCE STG Manage Bowel with Assistance. Mod A  Outcome: Progressing Daily supp.inserted by Nurse.wbb

## 2014-12-11 NOTE — Progress Notes (Signed)
Nursing Note: Pt called and asked for assistance to brush her teeth and requested that her bladder be scanned again.At 2240, pt was scanned for 425 cc and then in and out cathed for 452 cc.Pt was cathed by Sarah,NT.Pt tolerated well.wbb

## 2014-12-11 NOTE — Progress Notes (Signed)
Nursing Note: In light of high volumes ,pt asked to decrease her fluid intake and pt agreed.wbb

## 2014-12-11 NOTE — Progress Notes (Signed)
Physical Therapy Session Note  Patient Details  Name: [name] Weber MRN: [id number] Date of Birth: 04/05/1931  Today's Date: 12/11/2014 PT Individual Time: [id number] PT Individual Time Calculation (min): 20 min   Short Term Goals: Week 2:  PT Short Term Goal 1 (Week 2): Pt will perform bed mobility at S level from flat surface without rails PT Short Term Goal 2 (Week 2): Pt will transfer stand pivot/step with RW with min A PT Short Term Goal 3 (Week 2): Pt will be able to gait with RW x 50' with min A PT Short Term Goal 4 (Week 2): Pt will be able to go up/down 7 stairs with 1 rail with mod A  Skilled Therapeutic Interventions/Progress Updates:    Pt received semi reclined in bed having just completed previous PT session. Pt agreeable to participate with min coaxing. Pt reporting low BP during AM session. Resting vitals WNL; see below. Discussed strategies for managing orthostatic hypotension with focus on hydration, postural adjustment, compression garments, and performance of LE therex. Emphasized importance of alerting therapy/nursing staff to symptoms to decrease fall risk. Pt verbalized understanding. Pt performed supine>sit with HOB flat using bed rail with supervision, effective logroll transfer. Pt asymptomatic with bed mobility and during initial 20 seconds of standing. While standing in front of bed, pt performed squats with bilat UE support at rolling walker for functional LE strengthening. Upon onset of lightheadedness, pt safely repositioned into seated, where symptoms resolved quickly. Departed with pt semi reclined in bed with 3 rails up, bed alarm on, and pt in no apparent distress.  Therapy Documentation Precautions:  Precautions Precautions: Fall, Back Precaution Booklet Issued: No Precaution Comments: watch orthostatic BP Restrictions Weight Bearing Restrictions: No Vital Signs: Therapy Vitals Temp: 98.3 F (36.8 C) Temp Source: Oral Pulse Rate: 98 Resp: 17 BP:  120/61 mmHg Patient Position (if appropriate): Lying Oxygen Therapy SpO2: 100 % O2 Device: Not Delivered Pain: Pain Assessment Pain Assessment: No/denies pain  See FIM for current functional status  Therapy/Group: Individual Therapy  Calvert CantorHobble, Hadiya Spoerl A 12/11/2014, 6:49 PM

## 2014-12-11 NOTE — Progress Notes (Signed)
Occupational Therapy Session Note  Patient Details  Name: [name] Weber MRN: [id number] Date of Birth: 07/24/1931  Today's Date: 12/11/2014 OT Individual Time: 0800-0900 OT Individual Time Calculation (min): 60 min    Short Term Goals: Week 2:  OT Short Term Goal 1 (Week 2): Patient will perform stand pivot transfer <> BSC using grab bars vs RW with supervision OT Short Term Goal 2 (Week 2): Patient will perform shower transfer with supervision (consistantly) OT Short Term Goal 3 (Week 2): Patient will be able to perform simple meal prep with set-up assistance OT Short Term Goal 4 (Week 2): Patient will maintain dynamic standing balance with min guard assist for at least 10 minutes with one seated rest break (if needed) in prep for dynamic standing ADL tasks  Skilled Therapeutic Interventions/Progress Updates:    Pt engaged in BADL retraining including bathing at shower level and dressing with sit<>stand from EOB.  Pt accessed bathroom at w/c level and transferred to shower seat for bathing.  Pt incontinent of bowel while in shower and transferred back to w/c for transfer to toilet.  Pt completed toilet hygiene and transferred to w/c.  Pt completed dressing EOB.  Pt required extra time to complete all tasks with multiple rest breaks.  Pt completed tasks with supervision with occasional steady A with transfers.  Focus on activity tolerance, functional transfers, sit<>stand, standing balance, and safety awareness.  Therapy Documentation Precautions:  Precautions Precautions: Fall, Back Precaution Booklet Issued: No Precaution Comments: watch orthostatic BP Restrictions Weight Bearing Restrictions: No   ADL: ADL ADL Comments: Refer to FIM  See FIM for current functional status  Therapy/Group: Individual Therapy  [name], Pryce Folts Chappell 12/11/2014, 9:01 AM

## 2014-12-11 NOTE — Progress Notes (Signed)
Physical Therapy Session Note  Patient Details  Name: [name] Weber MRN: [id number] Date of Birth: 03/25/1931  Today's Date: 12/11/2014 PT Individual Time: 1445-1530 PT Individual Time Calculation (min): 45 min (15 min Make up time)  Short Term Goals: Week 2:  PT Short Term Goal 1 (Week 2): Pt will perform bed mobility at S level from flat surface without rails PT Short Term Goal 2 (Week 2): Pt will transfer stand pivot/step with RW with min A PT Short Term Goal 3 (Week 2): Pt will be able to gait with RW x 50' with min A PT Short Term Goal 4 (Week 2): Pt will be able to go up/down 7 stairs with 1 rail with mod A  Skilled Therapeutic Interventions/Progress Updates:   Session focused on functional w/c mobility and parts management (therapist put velcro on leg rest for easier ability to find lever to unlock), Nustep for general strengthening and endurance x 15 min on level 4, and stair negotiation using bilateral rails to work on motor control, balance, functional mobility, and overall strengthening. Pt started to c/o dizziness during stair training so did not continue after, BP = 112/60. Once returned to room, pt request toilet for BM; pt had BM in brief and required min A for transfer and for balance during clothing management. Verbalized understanding to use call bell and when finished and PT notified nursing staff pt in bathroom.   Therapy Documentation Precautions:  Precautions Precautions: Fall, Back Precaution Booklet Issued: No Precaution Comments: watch orthostatic BP Restrictions Weight Bearing Restrictions: No  Pain: No complaints of pain.  See FIM for current functional status  Therapy/Group: Individual Therapy  [name] Weber, [name] Weber [facility] 12/11/2014, 3:37 PM

## 2014-12-11 NOTE — Plan of Care (Signed)
Problem: RH BLADDER ELIMINATION Goal: RH STG MANAGE BLADDER WITH EQUIPMENT WITH ASSISTANCE STG Manage Bladder With Equipment With Assistance. Mod I  Outcome: Not Progressing Pt requires in and out caths Q 3-4 hrs by staff.

## 2014-12-11 NOTE — Progress Notes (Addendum)
Nursing Note: NT rounded on pt and pt was awake.Pt stated, "I have been hurting and my bladder feels full".A: This  nurse went in to see pt and pt was very angry.Pt states that staff have not been in since 8 pm to check her and cath. Her.I reminded pt of when she called earlier for help to brush her teeth and for us to re-check her bladder and that she was cathed a second time by  NT Sarah who is also at the bedside during this conversation and pt rolled her eyes at me and stated,"well you must have given me a tranquilizer ,cause I don't remember that." Pt further began to state," I don't know if you are aware,but when I came in the hospital ,I had blood on my spine and I was about to die.I don't feel I should be treated any different than anyone else." I pt made aware that we are here to take care of her and provide whatever she needs and that no she should not be treated any different than anyone else..R: Pt was in and out cathed for 900 cc. Pt tolerated well. Urine was very ,clear yellow.Gentle reminders that we cathed her earlier only made pt angry,so I just left it alone and allowed the pt to ventilate.I asked pt to call us if her bladder feels full.Pt states her pain went away after she was in and out cathed.PLAN-Scan bladder Q 3 hrs and assess for retention.Paged rapid response Nurse April and she  agreed with plan and will leave note in am for MD about frequent,high ,vloumes.Again reminded pt to call us if her bladder felt full.wbbPLEASE NOTE THAT PT HAS NOT BEEN GIVEN ANY PAIN MEDS ,SLEEP MEDS OR TRANQUILIZERS.wbb

## 2014-12-11 NOTE — Progress Notes (Signed)
Nursing Note: Pt called and very firmly stated."I thought you said that you would check my bladder in 3 hours.". I told pt that I would be right there and went to the room promptly.A: pt bladder scanned for >200 cc.R: Pt was in and out cathed by this nurse for 425 cc.Pt tolerated well.wbb

## 2014-12-11 NOTE — Progress Notes (Signed)
Nursing Note:  A: Pt scanned for 600cc. R: Pt in and out cathed by NT -Sarah for 650 cc. Pt tolerated well.wbb

## 2014-12-11 NOTE — Progress Notes (Signed)
Occupational Therapy Session Note  Patient Details  Name: [name] Weber MRN: [id number] Date of Birth: 04/13/1931  Today's Date: 12/11/2014 OT Missed Time:  (60) Missed Time Reason: Patient ill (comment);Patient fatigue (low BP; pt not feeling well and unable to participate)   Short Term Goals: Week 2:  OT Short Term Goal 1 (Week 2): Patient will perform stand pivot transfer <> BSC using grab bars vs RW with supervision OT Short Term Goal 2 (Week 2): Patient will perform shower transfer with supervision (consistantly) OT Short Term Goal 3 (Week 2): Patient will be able to perform simple meal prep with set-up assistance OT Short Term Goal 4 (Week 2): Patient will maintain dynamic standing balance with min guard assist for at least 10 minutes with one seated rest break (if needed) in prep for dynamic standing ADL tasks  Skilled Therapeutic Interventions/Progress Updates:    Pt missed 60 mins skilled OT services secondary to low BP and patient not feeling well.  NT present to assist pt to bed for bladder scan.  Therapy Documentation General OT Amount of Missed Time:  (60) Vital Signs: Therapy Vitals Pulse Rate: 88 BP: (!) 72/29 mmHg Patient Position (if appropriate): Standing  Therapy/Group: Individual Therapy  [name], Letetia Romanello Chappell 12/11/2014, 11:33 AM

## 2014-12-11 NOTE — Progress Notes (Signed)
Physical Therapy Session Note  Patient Details  Name: [name] Weber MRN: [id number] Date of Birth: 10/10/1931  Today's Date: 12/11/2014 PT Individual Time: 0900-1000 PT Individual Time Calculation (min): 60 min   Short Term Goals: Week 2:  PT Short Term Goal 1 (Week 2): Pt will perform bed mobility at S level from flat surface without rails PT Short Term Goal 2 (Week 2): Pt will transfer stand pivot/step with RW with min A PT Short Term Goal 3 (Week 2): Pt will be able to gait with RW x 50' with min A PT Short Term Goal 4 (Week 2): Pt will be able to go up/down 7 stairs with 1 rail with mod A  Skilled Therapeutic Interventions/Progress Updates:   Pt reports having a rough night. W/c propulsion down to therapy gym mod I on unit for propulsion for general strength and endurance. Gait training with RW with min A demonstrating improved gait pattern, balance, and coordination with RW in comparison to this therapist's session with pt yesterday morning x 65' x 2 with seated rest break. Neuro re-ed for balance, coordination, and motor control in quadruped and tall kneeling (min A to get into positions with adherence to back precautions with focus on glut and hip activation and trunk control. Dynamic standing balance activity to toss and catch a ball without UE support with min A and cues for upright posture and hip extension with 2 bouts and seated rest break. Pt reporting increased dizziness during session and BP taken and RN notified. Seated = 92/50 and standing BP = 72/29. Pt positioned in w/c in room (reports feeling ok when seated) with LE's elevated and RN aware.   Therapy Documentation Precautions:  Precautions Precautions: Fall, Back Precaution Booklet Issued: No Precaution Comments: watch orthostatic BP Restrictions Weight Bearing Restrictions: No  Pain: Reports intermittent pain in hip/back - medication received during session.   See FIM for current functional  status  Therapy/Group: Individual Therapy  [name], [name] 12/11/2014, 12:40 PM

## 2014-12-11 NOTE — Telephone Encounter (Signed)
Had sent refill for pts coumadin to [facility]  but this refill cancelled as pt is in the hospital at present and is not on coumadin.

## 2014-12-11 NOTE — Progress Notes (Signed)
Nursing Note:Rounded on pt along with NT ,Sarah and pt was awake looking for her glasses.We looked in the room ,on her bedside table and her her bedside drawers but we could not find them.Pt was okay and no longer acted as if she was upset.Pt almost behaved as if incident earlier did not happen.Pt was calm and pleasant.wbb

## 2014-12-11 NOTE — Patient Care Conference (Signed)
Inpatient RehabilitationTeam Conference and Plan of Care Update Date: 12/11/2014   Time: 1:10 PM    Patient Name: [name] Weber      Medical Record Number: [id number]  Date of Birth: 02/28/1931 Sex: Female         Room/Bed: 4W03C/4W03C-01 Payor Info: Payor: [employer]vertising copywriter[employer]ARE MEDICARE / Plan: [employer]ARP MEDICARE COMPLETE INACTIVE 2016-01 / Product Type: *No Product type* /    Admitting Diagnosis: t10 12 hematoma with paraplegia   Admit Date/Time:  11/29/2014  3:54 PM Admission Comments: No comment available   Primary Diagnosis:  Paraplegia at T9 level Principal Problem: Paraplegia at T9 level  Patient Active Problem List   Diagnosis Date Noted  . Anxiety state 12/10/2014  . Neurogenic bladder 11/30/2014  . Neurogenic bowel 11/30/2014  . Bacterial UTI 11/30/2014  . Thoracic myelopathy 11/29/2014  . Persistent atrial fibrillation 11/27/2014  . Traumatic spinal subdural hematoma   . Acute pulmonary embolism   . Orthostatic hypotension 11/22/2014  . Near syncope 11/21/2014  . Paraplegia at T9 level 11/17/2014  . Weakness of both legs   . Nocturnal leg cramps 07/16/2014  . Encounter for therapeutic drug monitoring 12/25/2013  . Malaise and fatigue 06/01/2011  . Atrial flutter 03/10/2011  . Encounter for long-term (current) use of anticoagulants 03/10/2011  . Benign hypertensive heart disease without heart failure 03/10/2011  . Hypercholesterolemia 03/10/2011  . Paroxysmal atrial flutter   . Palpitations   . PAC (premature atrial contraction)   . PVC's (premature ventricular contractions)   . Malaise   . Fatigue   . Myalgia     Expected Discharge Date: Expected Discharge Date: 12/18/14  Team Members Present: Physician leading conference: Dr. Faith RogueZachary Swartz Social Worker Present: Amada JupiterLucy Kyiesha Millward, LCSW Nurse Present: Carlean PurlMaryann Barbour, RN PT Present: Karolee StampsAlison Gray, PT OT Present: Roney MansJennifer Smith, OT PPS Coordinator present : Tora DuckMarie Noel, RN, CRRN     Current Status/Progress Goal  Weekly Team Focus  Medical   anxiety and sleep issues. ?some confusion. having problems at night, urine retention/uti  pain mgt, ego support, improve stability  mood, bladder managemet/caths,    Bowel/Bladder   Receiving suppository daily. LBM 12/10/14. Can be incontinent at times. Requiring I&O cath q 4-6hrs  Managed bowel and bladder  Timed toileting q 2-3 hrs.    Swallow/Nutrition/ Hydration             ADL's   overall min assist  overall supervision > mod I w/c level vs sit<>stand level (depending on progress)  ADL retraining, functional transfers, dynamic standing balance, activity tolerance, safety awareness   Mobility   min A transfers, min to mod A for gait and stairs, S w/c propulsion, cognition/anxiety/safety a concern this week  S w/c level; min A gait and stairs (upgraded gait distance)  safety, endurance, strength, pain management, transfers, gait training, neuro-re-ed, balance training, d/c planning, stairs   Communication             Safety/Cognition/ Behavioral Observations            Pain   Pain managed with Tramadol 50mg    <3  Continue to assess for effectiveness of medication    Skin   Thoracic incision OTA, edemaous at proximal end of the incisional line  No additional skin breakdown  Assess q shift for decrease swelling    Rehab Goals Patient on target to meet rehab goals: No Rehab Goals Revised: most goals downgraded to supervision overall - now recommending 24/7 supervision *See Care Plan and progress notes  for long and short-term goals.  Barriers to Discharge: bladder, anxiety, cognition    Possible Resolutions to Barriers:  sci ed, supervision goals now    Discharge Planning/Teaching Needs:  Pt still hopeful she could return to her home with only intermittent support, however, team now recommending 24/7 supervision.  Will discuss pvt duty vs. placment options  ongoing with whomever is to be caregiver   Team Discussion:  Neurologically improving.   Still with bladder management issues.  UTI being treated and may have been some of the cause of behavioral issues past few days.  Mood is definitely affecting her progress.  All goals set for supervision and min assist stairs.  Pt still resistant to accepting that she will need 24/7 assist at home - SW to follow up.  Family requesting that home eval be completed to help pt be more realistic - tx team to consider this.  Revisions to Treatment Plan:  Goals downgraded to supervision overall and need for 24/7 supervision.  May affect d/c plan as well.   Continued Need for Acute Rehabilitation Level of Care: The patient requires daily medical management by a physician with specialized training in physical medicine and rehabilitation for the following conditions: Daily direction of a multidisciplinary physical rehabilitation program to ensure safe treatment while eliciting the highest outcome that is of practical value to the patient.: Yes Daily medical management of patient stability for increased activity during participation in an intensive rehabilitation regime.: Yes Daily analysis of laboratory values and/or radiology reports with any subsequent need for medication adjustment of medical intervention for : Post surgical problems;Cardiac problems;Neurological problems  [name] Weber 12/11/2014, 3:03 PM

## 2014-12-11 NOTE — Progress Notes (Signed)
[facility] PHYSICAL MEDICINE & REHABILITATION     PROGRESS NOTE    Subjective/Complaints: Didn't have a real good night. Still having to be cathed. Anxious at times.  A  review of systems has been performed and if not noted above is otherwise negative.   Objective: Vital Signs: Blood pressure 117/70, pulse 85, temperature 99.5 F (37.5 C), temperature source Oral, resp. rate 18, SpO2 95 %. No results found.  Recent Labs  12/10/14 1013  WBC 4.9  HGB 12.5  HCT 38.1  PLT 217    Recent Labs  12/10/14 1013  NA 133*  K 3.5  CL 98  GLUCOSE 112*  BUN <5*  CREATININE 0.65  CALCIUM 8.3*   CBG (last 3)  No results for input(s): GLUCAP in the last 72 hours.  Wt Readings from Last 3 Encounters:  11/17/14 59.8 kg (131 lb 13.4 oz)  11/13/14 48.535 kg (107 lb)  07/16/14 48.988 kg (108 lb)    Physical Exam:  Constitutional: She is oriented to person, place, and time. She appears well-developed.  HENT: oral mucosa moist, dentition good Head: Normocephalic and atraumatic.  Eyes: Conjunctivae are normal. Pupils are equal, round, and reactive to light.  Neck: Normal range of motion. Neck supple.  Cardiovascular: Normal rate.  Respiratory: Effort normal and breath sounds normal. No respiratory distress. She has no wheezes.  GI: Soft. Bowel sounds are normal. She exhibits no distension.  Abdomen Non-tender.  Musculoskeletal: She exhibits no edema or tenderness. No spasms or "knot" on chest wall Neurological: She is alert and oriented to person, place, and time.  Speech clear. Follows commands without difficulty. impaired proprioception/LT in both legs. Strength: 3+/5HF,KE bilaterally and 4- ADF/APF. UE's 5/5.  Skin: Skin is warm and dry. Back incision clean,dry and intact.  Psych: pleasant. Sl anxious  Assessment/Plan: 1. Functional deficits secondary to T10-12 hematoma with subsequent myelopathy and paraplegia which require 3+ hours per day of interdisciplinary  therapy in a comprehensive inpatient rehab setting. Physiatrist is providing close team supervision and 24 hour management of active medical problems listed below. Physiatrist and rehab team continue to assess barriers to discharge/monitor patient progress toward functional and medical goals. FIM: FIM - Bathing Bathing Steps Patient Completed: Chest, Right Arm, Left Arm, Front perineal area, Abdomen, Buttocks, Right upper leg, Left upper leg, Right lower leg (including foot), Left lower leg (including foot) Bathing: 5: Supervision: Safety issues/verbal cues  FIM - Upper Body Dressing/Undressing Upper body dressing/undressing steps patient completed: Thread/unthread right sleeve of pullover shirt/dresss, Thread/unthread left sleeve of pullover shirt/dress, Put head through opening of pull over shirt/dress, Pull shirt over trunk Upper body dressing/undressing: 5: Set-up assist to: Obtain clothing/put away FIM - Lower Body Dressing/Undressing Lower body dressing/undressing steps patient completed: Thread/unthread right pants leg, Thread/unthread left pants leg, Pull pants up/down, Fasten/unfasten pants, Don/Doff right sock, Don/Doff left sock Lower body dressing/undressing: 4: Min-Patient completed 75 plus % of tasks (sit<>stand position using RW)  FIM - Toileting Toileting steps completed by patient: Adjust clothing prior to toileting, Performs perineal hygiene, Adjust clothing after toileting Toileting Assistive Devices: Grab bar or rail for support Toileting: 4: Steadying assist  FIM - Diplomatic Services operational officer Devices: Grab bars, Elevated toilet seat Toilet Transfers: 4-To toilet/BSC: Min A (steadying Pt. > 75%), 4-From toilet/BSC: Min A (steadying Pt. > 75%)  FIM - Bed/Chair Transfer Bed/Chair Transfer Assistive Devices: Bed rails, Arm rests Bed/Chair Transfer: 0: Activity did not occur  FIM - Locomotion: Wheelchair Distance: 150 Locomotion: Wheelchair: 5:  Travels  150 ft or more: maneuvers on rugs and over door sills with supervision, cueing or coaxing FIM - Locomotion: Ambulation Locomotion: Ambulation Assistive Devices: Walker - Rolling Ambulation/Gait Assistance: 4: Min guard Locomotion: Ambulation: 2: Travels 50 - 149 ft with minimal assistance (Pt.>75%)  Comprehension Comprehension Mode: Auditory Comprehension: 5-Follows basic conversation/direction: With no assist  Expression Expression Mode: Verbal Expression: 3-Expresses basic 50 - 74% of the time/requires cueing 25 - 50% of the time. Needs to repeat parts of sentences.  Social Interaction Social Interaction: 3-Interacts appropriately 50 - 74% of the time - May be physically or verbally inappropriate.  Problem Solving Problem Solving: 3-Solves basic 50 - 74% of the time/requires cueing 25 - 49% of the time  Memory Memory: 3-Recognizes or recalls 50 - 74% of the time/requires cueing 25 - 49% of the time  Medical Problem List and Plan: 1. Functional deficits secondary to T 10-12 hematoma with myelopathy and paraplegia 2. PE/BLE DVT /Anticoagulation: Mechanical: Sequential compression devices, below knee Bilateral lower extremities No blood thinner due to thoracic hematoma--verified with Dr. Jordan LikesPool  3. Pain Management: Continue oxycodone prn.   -  baclofen for m/s spasms 4. Mood: LCSW to follow for evaluation and support.   -believe anxiety is a factor.(?depression) Added low dose xanax prn   -will schedule low dose xanax at night  -neuropsych eval 5. Neuropsych: This patient is not quite capable of making decisions on her own behalf. 6. Skin/Wound Care:  7. Fluids/Electrolytes/Nutrition: Monitor I/O.  Intake improving      -supps  8. Pseudomonas A UTI: cipro for 7 days 9. Neurogenic bowel: large bm yesterday  -regular bowel regimen 10. Neurogenic bladder: i/o cath prn  -urecholine stopped due to SE  -double voids, up to toilet to void  -on 0.8 flomaxqhs  -rx uti 11.  Hyponatremia:  --normal 135  12. Hypotension: Will continue florinef.   -binder,teds  -hs flomax  13. Afib: ON ASA for stroke prevention. 14. Hypokalemia:  supp,diet   LOS (Days) 12 A FACE TO FACE EVALUATION WAS PERFORMED  [name] Weber T 12/11/2014 7:48 AM

## 2014-12-11 NOTE — Progress Notes (Signed)
Physical Therapy Note  Patient Details  Name: [name] Weber MRN: [id number] Date of Birth: 02/12/1931 Today's Date: 12/11/2014  Attempted to see patient for make up session, pt refused due to not feeling well and just returning to bed following last therapy session, will f/u per POC.     Bayard HuggerVarner, Alastor Kneale A 12/11/2014, 4:01 PM

## 2014-12-11 NOTE — Progress Notes (Signed)
Occupational Therapy Session Note  Patient Details  Name: [name] Weber MRN: [id number] Date of Birth: 03/06/1931  Today's Date: 12/11/2014 OT Individual Time: 1300-1330 OT Individual Time Calculation (min): 30 min    Skilled Therapeutic Interventions/Progress Updates:    Pt not feeling well reporting nausea during session.  Did agree to try and participate in session however.  BP taken in supine and then sitting with both around the 120/62 mark.  Once she positioned in sitting she was able to donn her shoes by bringing her LEs up to her with min guard assist.  She needed 2 attempts to tie the left shoe.  Worked on sit to stand from the EOB during session.  She was able to perform sit to stand on 3 attempts with min assist.  Noted left knee with decreased ability to maintain extension.  Attempted to release the LUE from the walker as well to work on standing balance.  Only able to tolerate standing with 1 UE supported for 5-7 seconds.  She was able to tolerate standing statically with bilateral UEs supported for 1:30-2:00 intervals.  Returned back to supine after removing her shoes with close supervision.   Therapy Documentation Precautions:  Precautions Precautions: Fall, Back Precaution Booklet Issued: No Precaution Comments: watch orthostatic BP Restrictions Weight Bearing Restrictions: No  Vital Signs: Therapy Vitals Pulse Rate: 78 BP: 120/62 mmHg Patient Position (if appropriate): Lying Pain: Pain Assessment Pain Assessment: No/denies pain Pain Location: Pelvis ADL: ADL ADL Comments: Refer to FIM  See FIM for current functional status  Therapy/Group: Individual Therapy  Thekla Colborn OTR/L 12/11/2014, 3:27 PM

## 2014-12-12 ENCOUNTER — Inpatient Hospital Stay (HOSPITAL_COMMUNITY): Payer: Medicare Other

## 2014-12-12 ENCOUNTER — Encounter (HOSPITAL_COMMUNITY): Payer: Medicare Other

## 2014-12-12 ENCOUNTER — Inpatient Hospital Stay (HOSPITAL_COMMUNITY): Payer: Medicare Other | Admitting: Occupational Therapy

## 2014-12-12 NOTE — Progress Notes (Signed)
Physical Therapy Session Note  Patient Details  Name: [name] Weber MRN: [id number] Date of Birth: 07/01/1931  Today's Date: 12/12/2014 PT Individual Time: 0900-1000 PT Individual Time Calculation (min): 60 min   Session 2 Time: 1100-1200 Time Calculation (min): 60  Short Term Goals: Week 2:  PT Short Term Goal 1 (Week 2): Pt will perform bed mobility at S level from flat surface without rails PT Short Term Goal 2 (Week 2): Pt will transfer stand pivot/step with RW with min A PT Short Term Goal 3 (Week 2): Pt will be able to gait with RW x 50' with min A PT Short Term Goal 4 (Week 2): Pt will be able to go up/down 7 stairs with 1 rail with mod A  Skilled Therapeutic Interventions/Progress Updates:    Session 1: Pt received seated in w/c handed off from OT, agreeable to participate in therapy. Session focused on functional ambulation, endurance, sit<>stand transfers. Instructed pt in point to point ambulation 30' w/ RW and MinGuard A, gait characterized by mild ataxia and scissoring which increases with fatigue. Instructed pt in sit<>stand transfers x5 w/ emphasis on anterior lean during both transitions. Instructed pt in mini-squats for bilateral LE strengthening. Instructed pt in horseshoe retrieval around rehab gym w/ RW and Min Guard A, no LOB noted with reaching outside BOS to different heights. Ambulated 100' to ortho gym w/ RW and MinGuard A w/ 1 LOB while turning requiring MinA to correct. Negotiated up/down 3 stairs w/ 1 rail and intermittent use of 2 rails ascending and descending forwards. Instructed pt in side step negotiation so that pt can have bilateral UE support while negotiating stairs (pt's home entry has 6 stairs w/ 1 rail on R, so pt can have 2 UE support while leading with L going up and R going down). Pt more comfortable and stable w/ side step strategy than with ascending/descending forwards. Pt propelled w/c back to room w/ S and transferred w/c>bed w/ RW and Min A. Pt  left supine in bed w/ all needs within reach w/ NT preparing to perform bladder scan.   Session 2: Pt received supine in bed, agreeable to participate in therapy. Session focused on functional ambulation, strengthening and endurance, bed mobility, proximal muscle strengthening. Pt ambulated 150' to/from rehab gym w/ Min Guard A, gait characterized by mild ataxia and scissoring which increases with fatigue or distraction. Pt completed 10' on Nustep L4 progressing to L7 w/ LE only for bilateral strengthening and endurance. Blocked practice on flat mat table for rolling L/R while adhering to back precautions w/ min cueing and SBA. Instructed pt in x15 isometric hip adduction and bridging for proximal hip strengthening to assist with increasing independence with bed mobility. Instructed pt in furniture transfer sit<>stand from sofa in ADL apartment. Session ended in pt's room where pt was left seated in w/c w/ family present and all needs within reach.   Therapy Documentation Precautions:  Precautions Precautions: Fall, Back Precaution Booklet Issued: No Precaution Comments: watch orthostatic BP Restrictions Weight Bearing Restrictions: No Pain:  No/denies pain  See FIM for current functional status  Therapy/Group: Individual Therapy  Hosie SpangleGodfrey, Adilen Pavelko  Hosie SpangleJess Davin Muramoto, PT, DPT 12/12/2014, 7:33 AM

## 2014-12-12 NOTE — Progress Notes (Signed)
Occupational Therapy Session Note  Patient Details  Name: Stephani Policevelyn C Brach MRN: [id number] Date of Birth: 07/06/1931  Today's Date: 12/12/2014 OT Individual Time: 0800-0900 OT Individual Time Calculation (min): 60 min    Short Term Goals: Week 2:  OT Short Term Goal 1 (Week 2): Patient will perform stand pivot transfer <> BSC using grab bars vs RW with supervision OT Short Term Goal 2 (Week 2): Patient will perform shower transfer with supervision (consistantly) OT Short Term Goal 3 (Week 2): Patient will be able to perform simple meal prep with set-up assistance OT Short Term Goal 4 (Week 2): Patient will maintain dynamic standing balance with min guard assist for at least 10 minutes with one seated rest break (if needed) in prep for dynamic standing ADL tasks  Skilled Therapeutic Interventions/Progress Updates:    Pt resting in bed upon arrival and ready for therapy.  Pt declined bathing at shower level and completed bathing and dressing tasks with sit<>stand from w/c at sink.  Pt required steady A for standing balance.  Pt requested use of toilet and performed toilet transfer with supervision using grab bars.  Pt required steady A when standing to pull up pants.  Pt propelled w/c to ADL apartment and practiced tub bench transfers and home mgmt tasks at RW level.  Pt required multiple rest breaks throughout session.  BP throughout session (seated) after ambulation - 98/60, 100/57, 110/61.  Focus on activity tolerance, sit<>stand, standing balance, functional amb with RW for home mgmt tasks, and safety awareness.  Therapy Documentation Precautions:  Precautions Precautions: Fall, Back Precaution Booklet Issued: No Precaution Comments: watch orthostatic BP Restrictions Weight Bearing Restrictions: No Pain:  Prt denies pain but stated her bladder "felt full" even though she had just been catherized. ADL: ADL ADL Comments: Refer to FIM  See FIM for current functional  status  Therapy/Group: Individual Therapy  [name], Shomari Matusik Chappell 12/12/2014, 11:17 AM

## 2014-12-12 NOTE — Progress Notes (Signed)
Nursing Note: Slept well.Un-eventful night.wbb

## 2014-12-12 NOTE — Progress Notes (Signed)
[name] Weber PHYSICAL MEDICINE & REHABILITATION     PROGRESS NOTE    Subjective/Complaints: Slept very well last night. Did not wake up. Requiring caths but volumes looking better.  A  review of systems has been performed and if not noted above is otherwise negative.   Objective: Vital Signs: Blood pressure 130/58, pulse 78, temperature 99.2 F (37.3 C), temperature source Oral, resp. rate 18, SpO2 96 %. No results found.  Recent Labs  12/10/14 1013  WBC 4.9  HGB 12.5  HCT 38.1  PLT 217    Recent Labs  12/10/14 1013  NA 133*  K 3.5  CL 98  GLUCOSE 112*  BUN <5*  CREATININE 0.65  CALCIUM 8.3*   CBG (last 3)  No results for input(s): GLUCAP in the last 72 hours.  Wt Readings from Last 3 Encounters:  11/13/14 48.535 kg (107 lb)  07/16/14 48.988 kg (108 lb)  03/13/14 49.442 kg (109 lb)    Physical Exam:  Constitutional: She is oriented to person, place, and time. She appears well-developed.  HENT: oral mucosa moist, dentition good Head: Normocephalic and atraumatic.  Eyes: Conjunctivae are normal. Pupils are equal, round, and reactive to light.  Neck: Normal range of motion. Neck supple.  Cardiovascular: Normal rate.  Respiratory: Effort normal and breath sounds normal. No respiratory distress. She has no wheezes.  GI: Soft. Bowel sounds are normal. She exhibits no distension.  Abdomen Non-tender.  Musculoskeletal: She exhibits no edema or tenderness. No spasms or "knot" on chest wall Neurological: She is alert and oriented to person, place, and time.  Speech clear. Follows commands without difficulty. impaired proprioception/LT in both legs. Strength: 3+/5HF,KE bilaterally and 4- ADF/APF. UE's 5/5.  Skin: Skin is warm and dry. Back incision clean,dry and intact.  Psych: pleasant. Sl anxious  Assessment/Plan: 1. Functional deficits secondary to T10-12 hematoma with subsequent myelopathy and paraplegia which require 3+ hours per day of  interdisciplinary therapy in a comprehensive inpatient rehab setting. Physiatrist is providing close team supervision and 24 hour management of active medical problems listed below. Physiatrist and rehab team continue to assess barriers to discharge/monitor patient progress toward functional and medical goals. FIM: FIM - Bathing Bathing Steps Patient Completed: Chest, Right Arm, Left Arm, Front perineal area, Abdomen, Buttocks, Right upper leg, Left upper leg, Right lower leg (including foot), Left lower leg (including foot) Bathing: 5: Supervision: Safety issues/verbal cues  FIM - Upper Body Dressing/Undressing Upper body dressing/undressing steps patient completed: Thread/unthread right sleeve of pullover shirt/dresss, Thread/unthread left sleeve of pullover shirt/dress, Put head through opening of pull over shirt/dress, Pull shirt over trunk Upper body dressing/undressing: 5: Set-up assist to: Obtain clothing/put away FIM - Lower Body Dressing/Undressing Lower body dressing/undressing steps patient completed: Thread/unthread right pants leg, Thread/unthread left pants leg, Pull pants up/down, Fasten/unfasten pants, Don/Doff right sock, Don/Doff left sock Lower body dressing/undressing: 4: Steadying Assist  FIM - Toileting Toileting steps completed by patient: Adjust clothing prior to toileting, Performs perineal hygiene, Adjust clothing after toileting Toileting Assistive Devices: Grab bar or rail for support Toileting: 4: Steadying assist  FIM - Diplomatic Services operational officerToilet Transfers Toilet Transfers Assistive Devices: Grab bars, Elevated toilet seat Toilet Transfers: 5-To toilet/BSC: Supervision (verbal cues/safety issues), 5-From toilet/BSC: Supervision (verbal cues/safety issues)  FIM - BankerBed/Chair Transfer Bed/Chair Transfer Assistive Devices: Arm rests, Walker, Bed rails Bed/Chair Transfer: 5: Supine > Sit: Supervision (verbal cues/safety issues), 3: Bed > Chair or W/C: Mod A (lift or lower assist)  FIM  - Locomotion: Wheelchair Distance: 150  Locomotion: Wheelchair: 6: Travels 150 ft or more, turns around, maneuvers to table, bed or toilet, negotiates 3% grade: maneuvers on rugs and over door sills independently FIM - Locomotion: Ambulation Locomotion: Ambulation Assistive Devices: Designer, industrial/product Ambulation/Gait Assistance: 4: Min guard Locomotion: Ambulation: 2: Travels 50 - 149 ft with minimal assistance (Pt.>75%)  Comprehension Comprehension Mode: Auditory Comprehension: 5-Follows basic conversation/direction: With no assist  Expression Expression Mode: Verbal Expression: 3-Expresses basic 50 - 74% of the time/requires cueing 25 - 50% of the time. Needs to repeat parts of sentences.  Social Interaction Social Interaction: 3-Interacts appropriately 50 - 74% of the time - May be physically or verbally inappropriate.  Problem Solving Problem Solving: 3-Solves basic 50 - 74% of the time/requires cueing 25 - 49% of the time  Memory Memory: 3-Recognizes or recalls 50 - 74% of the time/requires cueing 25 - 49% of the time  Medical Problem List and Plan: 1. Functional deficits secondary to T 10-12 hematoma with myelopathy and paraplegia 2. PE/BLE DVT /Anticoagulation: Mechanical: Sequential compression devices, below knee Bilateral lower extremities No blood thinner due to thoracic hematoma--verified with Dr. Jordan Likes  3. Pain Management: Continue oxycodone prn.   -  baclofen for m/s spasms 4. Mood: LCSW to follow for evaluation and support.   -believe anxiety is a factor.(?depression) Added low dose xanax prn   -will schedule low dose xanax at night  -neuropsych eval pending 5. Neuropsych: This patient is not quite capable of making decisions on her own behalf. 6. Skin/Wound Care:  7. Fluids/Electrolytes/Nutrition: Monitor I/O.  Intake improving      -supps  8. Pseudomonas A UTI: cipro for 7 days 9. Neurogenic bowel:    -regular bowel regimen 10. Neurogenic bladder: i/o  cath prn  -urecholine stopped due to SE  -double voids, up to toilet to void  -on 0.8 flomaxqhs  -rx uti  -do see some improvement 11. Hyponatremia:  --normal 135  12. Hypotension: Will continue florinef.   -binder,teds  -hs flomax  13. Afib: ON ASA for stroke prevention. 14. Hypokalemia:  supp,diet   LOS (Days) 13 A FACE TO FACE EVALUATION WAS PERFORMED  SWARTZ,ZACHARY T 12/12/2014 8:00 AM

## 2014-12-12 NOTE — Plan of Care (Signed)
Problem: RH BOWEL ELIMINATION Goal: RH STG MANAGE BOWEL W/MEDICATION W/ASSISTANCE STG Manage Bowel with Medication with total Assistance.  Outcome: Progressing Bowel program suppository administered q 0600 am

## 2014-12-12 NOTE — Progress Notes (Signed)
Occupational Therapy Session Note  Patient Details  Name: Stephani Policevelyn C Kirchhoff MRN: [id number] Date of Birth: 01/18/1931  Today's Date: 12/12/2014 OT Individual Time: 1330-1430 OT Individual Time Calculation (min): 60 min    Short Term Goals: Week 2:  OT Short Term Goal 1 (Week 2): Patient will perform stand pivot transfer <> BSC using grab bars vs RW with supervision OT Short Term Goal 2 (Week 2): Patient will perform shower transfer with supervision (consistantly) OT Short Term Goal 3 (Week 2): Patient will be able to perform simple meal prep with set-up assistance OT Short Term Goal 4 (Week 2): Patient will maintain dynamic standing balance with min guard assist for at least 10 minutes with one seated rest break (if needed) in prep for dynamic standing ADL tasks  Skilled Therapeutic Interventions/Progress Updates:    Engaged in therapeutic activity with focus on functional mobility, standing balance, and safety with kitchen tasks.  Pt reports fatigue but willing to participate.  Pt propelled w/c to ADL kitchen. In kitchen ambulated with RW to obtain items to prepare tuna salad.  Provided min question cues for sequencing and locating items and min/steady assist with turns in tight space.  Educated on positioning with RW to improve safety with reaching into oven.  Engaged in table top task to increase activity tolerance and standing while completing word search puzzle on vertical surface.  Pt stood for 5 mins without rest break.  Returned to room with RW with close supervision, noted pt tends to become increasingly "shaky" as she fatigues both with standing tasks and ambulation.    Therapy Documentation Precautions:  Precautions Precautions: Fall, Back Precaution Booklet Issued: No Precaution Comments: watch orthostatic BP Restrictions Weight Bearing Restrictions: No Vital Signs: Therapy Vitals Temp: 98.1 F (36.7 C) Temp Source: Oral Pulse Rate: 80 Resp: 20 BP: 108/63 mmHg Patient  Position (if appropriate): Sitting Oxygen Therapy SpO2: 98 % Pain:   Pt with no c/o pain ADL: ADL ADL Comments: Refer to FIM  See FIM for current functional status  Therapy/Group: Individual Therapy  Rosalio LoudHOXIE, Ebonee Stober 12/12/2014, 2:54 PM

## 2014-12-13 ENCOUNTER — Inpatient Hospital Stay (HOSPITAL_COMMUNITY): Payer: Medicare Other

## 2014-12-13 NOTE — Progress Notes (Signed)
Social Work Patient ID: [name] Weber, female   DOB: 30-Nov-1931, 81 y.o.   MRN: [id number]   Met yesterday afternoon with pt, niece and neighbor to review team conference information.  Explained that team has adjusted overall goals for supervision (min with stairs) and are recommending 24/7 care at home.  Pt clearly states that she does not want to have family staying with her after d/c (dtr-in-law had offered).  Niece questions if SNF after CIR would be an option.  Explained that this could be an option but would not anticipate being able to justify longer than 2-3 weeks.  Also stressed to all that, even with a short SNF stay, she may still need supervision at that point.   Pt and niece discussing this option and pt states that she would like to change CIR d/c plan to SNF "even if it means I leave earlier than next Tuesday... I just think that makes the most sense...".   Will complete FL2 and send to area facilities today.  Will keep staff posted.  Lateef Juncaj, LCSW

## 2014-12-13 NOTE — Progress Notes (Signed)
Physical Therapy Weekly Progress Note  Patient Details  Name: [name] Weber MRN: [id number] Date of Birth: Apr 20, 1931  Beginning of progress report period: December 06, 2014 End of progress report period: December 13, 2014   Patient has met 4 of 4 short term goals. Pt is making good gains while on CIR especially in regards to functional ambulation. Pt has had some inconsistency with cognition and continues to require cues for safety and therefore, recommending 24/7 supervision upon discharge. Due to pt not having this available to her at her home, plan is now for pt to d/c to SNF. Goals remain for S level basic transfers and up to min A for gait and stairs.  Patient continues to demonstrate the following deficits: decreased strength, decreased endurance, decreased balance, decreased coordination and motor control, impaired cognition, gait impairments, and decreased functional mobility and therefore will continue to benefit from skilled PT intervention to enhance overall performance with activity tolerance, balance, postural control, ability to compensate for deficits, functional use of  right lower extremity and left lower extremity, awareness, coordination and knowledge of precautions.  Patient progressing toward long term goals..  Continue plan of care.  PT Short Term Goals Week 2:  PT Short Term Goal 1 (Week 2): Pt will perform bed mobility at S level from flat surface without rails PT Short Term Goal 1 - Progress (Week 2): Met PT Short Term Goal 2 (Week 2): Pt will transfer stand pivot/step with RW with min A PT Short Term Goal 2 - Progress (Week 2): Met PT Short Term Goal 3 (Week 2): Pt will be able to gait with RW x 50' with min A PT Short Term Goal 3 - Progress (Week 2): Met PT Short Term Goal 4 (Week 2): Pt will be able to go up/down 7 stairs with 1 rail with mod A PT Short Term Goal 4 - Progress (Week 2): Met Week 3:  PT Short Term Goal 1 (Week 3): = LTGs   Skilled Therapeutic  Interventions/Progress Updates:  Training and development officer;Ambulation/gait training;Cognitive remediation/compensation;Community reintegration;Discharge planning;Disease management/prevention;DME/adaptive equipment instruction;Functional mobility training;Neuromuscular re-education;Pain management;Patient/family education;Psychosocial support;Skin care/wound management;Splinting/orthotics;Stair training;Therapeutic Activities;UE/LE Strength taining/ROM;Therapeutic Exercise;UE/LE Coordination activities;Wheelchair propulsion/positioning   Therapy Documentation Precautions:  Precautions Precautions: Fall, Back Precaution Booklet Issued: No Precaution Comments: watch orthostatic BP Restrictions Weight Bearing Restrictions: No   See FIM for current functional status   Canary Brim [facility] 12/13/2014, 2:44 PM

## 2014-12-13 NOTE — Consult Note (Signed)
NEUROCOGNITIVE STATUS EXAMINATION - CONFIDENTIAL [facility]   [name] is an 81 year old woman, who was seen for a brief neurocognitive status examination to evaluate her emotional state and mental status post spinal hemorrhage/hematoma.  According to her medical record, she was admitted on 11/16/14 with new low back pain with weakness and inability to walk.  MRI of the spine demonstrated abnormal cord signal due to hemorrhage/hematoma in the ventral spinal canal causing spinal cord compression.    Emotional Functioning:  During the clinical interview, [name] described significant frustration over her family's reaction to her illness and willingness to care for her in the way that she wants to be cared for.  She also expressed fears that certain family members are after her money.  She stated that some people seem to be questioning her competence to make decisions and she was unwilling to entertain the possibility that family members were looking out for her best interest.  She made reference to wanting to change her will based on recent events and she said that she would like to have her locks changed to prevent family members from being able to come into her home whenever they please, as she is worried that they may steal from her.  Although she expressed feeling isolated from family, she repeatedly described feeling supported by neighbors and she mentioned that she would not make any major decisions (e.g. altering her will) until she felt cognitively recovered.  Time was spent during this session in validating [name]' emotions and processing her reactions and plans for how to resolve the situation.  Despite report of significant stress, she denied major symptoms of depression, including suicidal ideation.    Mental Status:  [name]' total score on an overall measure of mental status was suggestive of marked cognitive disruption at the level of dementia  (MoCA = 15/30).  She lost points for executive dysfunction, visuospatial problems, inattention, poor verbal fluency, and trouble with abstract reasoning.  In addition, she was able to freely recall only 2 of 5 previously studied words after a brief delay.  Subjectively, she reported noticing memory changes, expressive speech difficulties, and word-finding problems.  Also, she noted having trouble hearing and at times, and her hearing difficulty seemed to adversely affect her test performance, as she did not accurately hear the examiner's prompts.    Impressions and Recommendations:  [name]' total score on an overall measure of mental status was suggestive of marked cognitive disruption, at the level of dementia.  Points were lost on subtests requiring a variety of skills across cognitive domains.  Although hearing difficulties could have compounded her cognitive inefficiencies, it is not thought that hearing trouble alone can account for the noted cognitive problems.  We cannot exclude the possibility of the presence of an underlying neurodegenerative condition (e.g. Alzheimer's disease).  It is sometimes the case that individuals are able to compensate well for structural changes associated with Alzheimer's disease until a medical event (in this case spinal SDH) renders them unable to cope as well and their functional ability catches up with any existing structural changes.  This could be the case here.  However, more thorough evaluation would be necessary to make that determination.  It could also be that high levels of stress and possible fatigue and pain are causing the cognitive disruption that was seen during the current evaluation.  A more comprehensive evaluation in one month or so would be useful in tracking her cognitive progress over  time to clarify the etiology of her cognitive inefficiencies.  She was advised not to make any major legal or life decisions when her cognitive functioning is at the  current level.  However, her cognitive abilities should be evaluated more thoroughly if capacity for decision-making comes into question.    From an emotional standpoint, [name] seems to be experiencing high levels of stress and anxiety.  However, it appears to be situational and likely does not require medication for treatment.  Continued support throughout her stay would likely be beneficial.  In addition, she was open to participating in individual psychotherapy post-discharge, which would likely serve to provide support that would likely ultimately improve her mood.  Information on providers in her area should be included with her discharge paperwork.    DIAGNOSES: Spinal SDH Adjustment disorder with anxious mood  [name] Weber, Psy.D.  Clinical Neuropsychologist

## 2014-12-13 NOTE — Progress Notes (Signed)
Physical Therapy Session Note  Patient Details  Name: [name] Weber MRN: [id number] Date of Birth: 11/19/1931  Today's Date: 12/13/2014 PT Individual Time: 0750-0850 PT Individual Time Calculation (min): 60 min   Short Term Goals: Week 3:     Skilled Therapeutic Interventions/Progress Updates:  Patient completing grooming tasks upon entering room. Patient donned socks and shoes independently. Patient donned bra and shirt independently as well. Patient propelled wheelchair 180 feet with UE's mod I. Patient performed [place] exercises for strength, endurance, and decrease fall risk for 2 sets of 10 reps each with UE support. Patient ambulated up and down 6 steps (4 inch x 1 and 6 inch x 1) using railing on right and going sideways with min assist. Patient with no complaints of dizziness during activity. Patient returned to room and left in wheelchair with all items in reach.  Therapy Documentation Precautions:  Precautions Precautions: Fall, Back Precaution Booklet Issued: No Precaution Comments: watch orthostatic BP Restrictions Weight Bearing Restrictions: No  Vital Signs: Pre-exercise Bp sitting 93/58 Post exercise Bp sitting 95/61  See FIM for current functional status  Therapy/Group: Individual Therapy  Arelia LongestWindsor, Kristian Hazzard M 12/13/2014, 9:08 AM

## 2014-12-13 NOTE — Progress Notes (Signed)
Physical Therapy Session Note  Patient Details  Name: [name] Weber MRN: [id number] Date of Birth: 02-01-1931  Today's Date: 12/13/2014 PT Individual Time: 1330-1430 PT Individual Time Calculation (min): 60 min   Short Term Goals: Week 2:  PT Short Term Goal 1 (Week 2): Pt will perform bed mobility at S level from flat surface without rails PT Short Term Goal 1 - Progress (Week 2): Met PT Short Term Goal 2 (Week 2): Pt will transfer stand pivot/step with RW with min A PT Short Term Goal 2 - Progress (Week 2): Met PT Short Term Goal 3 (Week 2): Pt will be able to gait with RW x 50' with min A PT Short Term Goal 3 - Progress (Week 2): Met PT Short Term Goal 4 (Week 2): Pt will be able to go up/down 7 stairs with 1 rail with mod A PT Short Term Goal 4 - Progress (Week 2): Met  Skilled Therapeutic Interventions/Progress Updates:  Session focused on functional gait with RW and w/c mobility in community environment (off unit and in gift shop) with focus on obstacle negotiation (S w/c level and steady A with RW), neuro re-ed on Biodex for various balance activities to work on Sombrillo and balance reactions on compliant and non-compliant surface for Random Control and Limits of Stability activity, and gait with RW from therapy gym to pt room with steady A with cues for posture and step length. Positioned in w/c with LE's elevated on chair for edema control back in room.  Therapy Documentation Precautions:  Precautions Precautions: Fall, Back Precaution Booklet Issued: No Precaution Comments: watch orthostatic BP Restrictions Weight Bearing Restrictions: No Pain: Pain Assessment Pain Assessment: No/denies pain Locomotion : Ambulation Ambulation/Gait Assistance: 4: Min guard   See FIM for current functional status  Therapy/Group: Individual Therapy  Canary Brim [facility] 12/13/2014, 2:36 PM

## 2014-12-13 NOTE — Progress Notes (Signed)
Physical Therapy Session Note  Patient Details  Name: [name] Weber MRN: [id number] Date of Birth: 10/09/1931  Today's Date: 12/13/2014 PT Individual Time: 0930-1030 PT Individual Time Calculation (min): 60 min   Short Term Goals: Week 2:  PT Short Term Goal 1 (Week 2): Pt will perform bed mobility at S level from flat surface without rails PT Short Term Goal 2 (Week 2): Pt will transfer stand pivot/step with RW with min A PT Short Term Goal 3 (Week 2): Pt will be able to gait with RW x 50' with min A PT Short Term Goal 4 (Week 2): Pt will be able to go up/down 7 stairs with 1 rail with mod A  Skilled Therapeutic Interventions/Progress Updates:   Session focused on functional w/c propulsion for strength and endurance and w/c parts management (still requiring extra time and significant cues to manage legrests), neuro re-ed for BLE and balance using Kinetron in standing position (warmed up in seated position for LE strengthening x 3 min) for strengthening as well as balance activity without UE support (up to 1 min and half before rest break needed), dynamic gait through 2 obstacle courses navigating cones for turns and stepping over simulated threshold with overall min guard A and cues for technique initially, and simulated car transfer to sedan height with RW with steady A and cues for hand placement during sit to stand. [facility] continue her rehab due to requiring 24/7S and this is not an option at her house.  Therapy Documentation Precautions:  Precautions Precautions: Fall, Back Precaution Booklet Issued: No Precaution Comments: watch orthostatic BP Restrictions Weight Bearing Restrictions: No  Pain:  Premedicated for pain.  See FIM for current functional status  Therapy/Group: Individual Therapy  [name] StampsGray, [name] 12/13/2014, 11:50 AM

## 2014-12-13 NOTE — Progress Notes (Signed)
Occupational Therapy Session Note  Patient Details  Name: Stephani Policevelyn C Korol MRN: [id number] Date of Birth: 01/16/1931  Today's Date: 12/13/2014 OT Individual Time: 1100-1200 OT Individual Time Calculation (min): 60 min    Short Term Goals: Week 2:  OT Short Term Goal 1 (Week 2): Patient will perform stand pivot transfer <> BSC using grab bars vs RW with supervision OT Short Term Goal 2 (Week 2): Patient will perform shower transfer with supervision (consistantly) OT Short Term Goal 3 (Week 2): Patient will be able to perform simple meal prep with set-up assistance OT Short Term Goal 4 (Week 2): Patient will maintain dynamic standing balance with min guard assist for at least 10 minutes with one seated rest break (if needed) in prep for dynamic standing ADL tasks  Skilled Therapeutic Interventions/Progress Updates: ADL-retraining at shower level with focus on safe bathroom transfers, dynamic standing balance, endurance, and discharge planning.    Pt received seated in w/c with her legs elevated and with complaint of moderate fatigue after physical therapy session.   Pt was agreeable to bathing/dressing with rest breaks at shower level.    Pt required subtle vc for safety during shower prep to gather supplies and RW and to problem-solve for use of socks/shoes during mobility.    Pt then deferred ambulating to tub bench and elected to perform w/c transfer for safety d/t fear of leg weakness.   Pt bathed sitting/standing using grab bar and min guard assist while standing to wash peri-area and buttocks.   Pt required rest break at sink prior to dressing with min assist to fasten brief while standing supported at RW.    After setup to apply TEDs, pt donned shoes and tied her laces by bringing her feet up and supportting each foot at edge of w/c seat.    Pt then discussed discharge plan with therapist stating she was aware of need for continued rehab stay prior to returning home.    Pt questions her ability  to perform meal prep and home making tasks safely and was challenged to attempt making a bed, partially, in ADL apartment.    Pt was escorted to ADL apartment and using RW applied only a bed cover to full size bed but required min guard assist during entire task d/t increased LE weakness while bending knees and reaching forward.   Long term goals downgraded for safety with recognition of need for transition to extended rehab facility prior to return home, as per discussion with COTA.     Therapy Documentation Precautions:  Precautions Precautions: Fall, Back Precaution Booklet Issued: No Precaution Comments: watch orthostatic BP Restrictions Weight Bearing Restrictions: No  Pain: No/denies pain  ADL: ADL ADL Comments: Refer to FIM  See FIM for current functional status  Therapy/Group: Individual Therapy  Kayla Weekes 12/13/2014, 12:41 PM

## 2014-12-13 NOTE — Progress Notes (Signed)
[facility] [facility]ICINE & REHABILITATION     PROGRESS NOTE    Subjective/Complaints: No new issues today. Slept fairly well.  A  review of systems has been performed and if not noted above is otherwise negative.   Objective: Vital Signs: Blood pressure 130/62, pulse 90, temperature 98.7 F (37.1 C), temperature source Oral, resp. rate 18, weight 43.8 kg (96 lb 9 oz), SpO2 99 %. No results found.  Recent Labs  12/10/14 1013  WBC 4.9  HGB 12.5  HCT 38.1  PLT 217    Recent Labs  12/10/14 1013  NA 133*  K 3.5  CL 98  GLUCOSE 112*  BUN <5*  CREATININE 0.65  CALCIUM 8.3*   CBG (last 3)  No results for input(s): GLUCAP in the last 72 hours.  Wt Readings from Last 3 Encounters:  12/12/14 43.8 kg (96 lb 9 oz)  11/13/14 48.535 kg (107 lb)  07/16/14 48.988 kg (108 lb)    Physical Exam:  Constitutional: She is oriented to person, place, and time. She appears well-developed.  HENT: oral mucosa moist, dentition good Head: Normocephalic and atraumatic.  Eyes: Conjunctivae are normal. Pupils are equal, round, and reactive to light.  Neck: Normal range of motion. Neck supple.  Cardiovascular: Normal rate.  Respiratory: Effort normal and breath sounds normal. No respiratory distress. She has no wheezes.  GI: Soft. Bowel sounds are normal. She exhibits no distension.  Abdomen Non-tender.  Musculoskeletal: She exhibits no edema or tenderness. No spasms or "knot" on chest wall Neurological: She is alert and oriented to person, place, and time.  Speech clear. Follows commands without difficulty. impaired proprioception/LT in both legs. Strength: 3+/5HF,KE bilaterally and 4- ADF/APF. UE's 5/5.  Skin: Skin is warm and dry. Back incision clean,dry and intact.  Psych: pleasant. Sl anxious  Assessment/Plan: 1. Functional deficits secondary to T10-12 hematoma with subsequent myelopathy and paraplegia which require 3+ hours per day of interdisciplinary therapy in a  comprehensive inpatient rehab setting. Physiatrist is providing close team supervision and 24 hour management of active medical problems listed below. Physiatrist and rehab team continue to assess barriers to discharge/monitor patient progress toward functional and medical goals. FIM: FIM - Bathing Bathing Steps Patient Completed: Chest, Right Arm, Left Arm, Front perineal area, Abdomen, Buttocks, Right upper leg, Left upper leg, Right lower leg (including foot), Left lower leg (including foot) Bathing: 5: Supervision: Safety issues/verbal cues  FIM - Upper Body Dressing/Undressing Upper body dressing/undressing steps patient completed: Thread/unthread right sleeve of pullover shirt/dresss, Thread/unthread left sleeve of pullover shirt/dress, Put head through opening of pull over shirt/dress, Pull shirt over trunk Upper body dressing/undressing: 5: Set-up assist to: Obtain clothing/put away FIM - Lower Body Dressing/Undressing Lower body dressing/undressing steps patient completed: Thread/unthread right pants leg, Thread/unthread left pants leg, Pull pants up/down, Fasten/unfasten pants, Don/Doff right sock, Don/Doff left sock Lower body dressing/undressing: 4: Steadying Assist  FIM - Toileting Toileting steps completed by patient: Adjust clothing prior to toileting, Performs perineal hygiene, Adjust clothing after toileting Toileting Assistive Devices: Grab bar or rail for support Toileting: 4: Steadying assist  FIM - Diplomatic Services operational officerToilet Transfers Toilet Transfers Assistive Devices: Grab bars, Elevated toilet seat Toilet Transfers: 5-To toilet/BSC: Supervision (verbal cues/safety issues), 5-From toilet/BSC: Supervision (verbal cues/safety issues)  FIM - BankerBed/Chair Transfer Bed/Chair Transfer Assistive Devices: Arm rests, Walker, Bed rails Bed/Chair Transfer: 5: Supine > Sit: Supervision (verbal cues/safety issues), 5: Sit > Supine: Supervision (verbal cues/safety issues), 4: Bed > Chair or W/C: Min A  (steadying Pt. >  75%), 4: Chair or W/C > Bed: Min A (steadying Pt. > 75%)  FIM - Locomotion: Wheelchair Distance: 150 Locomotion: Wheelchair: 6: Travels 150 ft or more, turns around, maneuvers to table, bed or toilet, negotiates 3% grade: maneuvers on rugs and over door sills independently FIM - Locomotion: Ambulation Locomotion: Ambulation Assistive Devices: Designer, industrial/product Ambulation/Gait Assistance: 4: Min guard Locomotion: Ambulation: 4: Travels 150 ft or more with minimal assistance (Pt.>75%)  Comprehension Comprehension Mode: Auditory Comprehension: 5-Follows basic conversation/direction: With no assist  Expression Expression Mode: Verbal Expression: 3-Expresses basic 50 - 74% of the time/requires cueing 25 - 50% of the time. Needs to repeat parts of sentences.  Social Interaction Social Interaction: 3-Interacts appropriately 50 - 74% of the time - May be physically or verbally inappropriate.  Problem Solving Problem Solving: 3-Solves basic 50 - 74% of the time/requires cueing 25 - 49% of the time  Memory Memory: 3-Recognizes or recalls 50 - 74% of the time/requires cueing 25 - 49% of the time  Medical Problem List and Plan: 1. Functional deficits secondary to T 10-12 hematoma with myelopathy and paraplegia 2. PE/BLE DVT /Anticoagulation: Mechanical: Sequential compression devices, below knee Bilateral lower extremities No blood thinner due to thoracic hematoma--verified with Dr. Jordan Likes  3. Pain Management: Continue oxycodone prn.   -  baclofen for m/s spasms 4. Mood: LCSW to follow for evaluation and support.   -believe anxiety is a factor.(?depression) Added low dose xanax prn   -will schedule low dose xanax at night  -neuropsych appreciated---15/30 on MoCA.  Will consider memory aid once home from hospital/SNF and through the stress of this event. 5. Neuropsych: This patient is not quite capable of making decisions on her own behalf. 6. Skin/Wound Care:  7.  Fluids/Electrolytes/Nutrition: Monitor I/O.  Intake improving      -supps  8. Pseudomonas A UTI: cipro for 7 days 9. Neurogenic bowel:    -regular bowel regimen 10. Neurogenic bladder: i/o cath prn  -urecholine stopped due to SE  -double voids, up to toilet to void  -on 0.8 flomaxqhs  -rx uti  -showing some signs of improvement 11. Hyponatremia:  --normal 135  12. Hypotension: Will continue florinef.   -binder,teds  -hs flomax  13. Afib: ON ASA for stroke prevention. 14. Hypokalemia:  supp,diet   LOS (Days) 14 A FACE TO FACE EVALUATION WAS PERFORMED  SWARTZ,ZACHARY T 12/13/2014 8:13 AM

## 2014-12-14 ENCOUNTER — Inpatient Hospital Stay (HOSPITAL_COMMUNITY): Payer: Medicare Other | Admitting: *Deleted

## 2014-12-14 ENCOUNTER — Inpatient Hospital Stay (HOSPITAL_COMMUNITY): Payer: Medicare Other

## 2014-12-14 DIAGNOSIS — A499 Bacterial infection, unspecified: Secondary | ICD-10-CM

## 2014-12-14 DIAGNOSIS — F411 Generalized anxiety disorder: Secondary | ICD-10-CM

## 2014-12-14 MED ORDER — FLUDROCORTISONE ACETATE 0.1 MG PO TABS
0.1000 mg | ORAL_TABLET | Freq: Every day | ORAL | Status: DC
  Administered 2014-12-15 – 2014-12-17 (×3): 0.1 mg via ORAL
  Filled 2014-12-14 (×4): qty 1

## 2014-12-14 NOTE — Progress Notes (Addendum)
Social Work Patient ID: [name] Weber, female   DOB: 09/30/1931, 81 y.o.   MRN: [id number]   [facility]  Pt accepting this offer and plans are to transfer pt on Monday (most likely via niece's car).  Tx team aware.  Have also notified pt's son/ dtr-in-law, niece and neighbor of plans.  Solangel Mcmanaway, LCSW

## 2014-12-14 NOTE — Discharge Summary (Signed)
Physician Discharge Summary  Patient ID: [name] Weber MRN: [id number] DOB/AGE: Apr 02, 1931 81 y.o.  Admit date: 11/29/2014 Discharge date: 12/17/2014  Discharge Diagnoses:  Principal Problem:   Paraplegia at T9 level Active Problems:   Thoracic myelopathy   Neurogenic bladder   Neurogenic bowel   Bacterial UTI   Anxiety state   Discharged Condition: Stable  Significant Diagnostic Studies: Dg Abd 1 View  11/29/2014   CLINICAL DATA:  Follow-up ileus or constipation.  EXAM: ABDOMEN - 1 VIEW  COMPARISON:  November 26, 2014.  FINDINGS: IVC filter is noted. Residual contrast is noted throughout the colon. There is no evidence of bowel dilatation. Moderate residual stool is noted.  IMPRESSION: Moderate stool burden is noted suggesting constipation.   Electronically Signed   By: Roque Lias M.D.   On: 11/29/2014 20:19    Labs:  Basic Metabolic Panel: BMET    Component Value Date/Time   NA 133* 12/10/2014 1013   K 3.5 12/10/2014 1013   CL 98 12/10/2014 1013   CO2 32 12/10/2014 1013   GLUCOSE 112* 12/10/2014 1013   BUN <5* 12/10/2014 1013   CREATININE 0.65 12/10/2014 1013   CALCIUM 8.3* 12/10/2014 1013   GFRNONAA 81* 12/10/2014 1013   GFRAA >90 12/10/2014 1013     CBC: CBC    Component Value Date/Time   WBC 4.9 12/10/2014 1013   RBC 3.90 12/10/2014 1013   HGB 12.5 12/10/2014 1013   HCT 38.1 12/10/2014 1013   PLT 217 12/10/2014 1013   MCV 97.7 12/10/2014 1013   MCH 32.1 12/10/2014 1013   MCHC 32.8 12/10/2014 1013   RDW 12.4 12/10/2014 1013   LYMPHSABS 0.5* 12/10/2014 1013   MONOABS 0.5 12/10/2014 1013   EOSABS 0.5 12/10/2014 1013   BASOSABS 0.0 12/10/2014 1013     CBG: No results for input(s): GLUCAP in the last 168 hours.  Brief HPI:   [name] Weber is a 81 y.o. female with a history of PAF on coumadin, HLD and no other real significant medical history who presented to 11/16/14 with new low back pain with weakness and inability to walk. MRI of spine  with abnormal cord signal due to hemorrhage/hematoma in the ventral spinal canal T 10- T 12 causing spinal chord compression. Her coumadin was emergently reversed and patient underwent thoracic laminectomy T 11 -T 12 decompression of epidural space and evacuation of SDH by Dr. [name] the same day. Post op with A flutter and cardiology assisting with rate control and BP management.  She developed hypotension requiring florinef for BP support and cardizem changed to digoxin to help with A flutter. She was found to have RLL PE and BLE dopplers with mobile DVT in CFV and in gastrocnemius as well as L-DVT in soleus vein. Therefore IVC filter placed by radiology on 12/29 and patient was cleared to start ASA for stroke prevention per Dr. [name].  Patient with resultant paraparesis as well as bowel/bladder incontinence and CIR was recommended for follow up therapy.   Hospital Course: [name] Weber was admitted to rehab 11/29/2014 for inpatient therapies to consist of PT, ST and OT at least three hours five days a week. Past admission physiatrist, therapy team and rehab RN have worked together to provide customized collaborative inpatient rehab. Blood pressures have been monitored on tid basis and are stabilizing with decrease  in orthostatic episodes.  Florinef was scheduled early am to help with symptoms and heart rate has been controlled on digoxin.  Pain has steadily  imporved and she has not required any narcotics for the past week. Initially she was limited by RLE pain due to spasms and this has improved with ROM, low dose baclofen as well as increase in activity level.  She was treated with suprax for E coli UTI and foley was discontinued and voiding trial was initiated. Daily am suppository was added for bowel program. She has had mprovement in voiding function but continues to have high PVRs requiring in and out caths. Urecholine was added briefly to help with voiding but she unable to tolerate this due to  hypotension therefore it was discontinued. She had increase in lethargy on 01/09 and was found to have Pseudomonas UTI. This was treated with 7 day course of cipro.  Lethargy has resolved but she continues to have problems with high levels of anxiety and low dose xanax was added at bedtime to help with anxiety affecting sleep cycle at nights.  Dr. Wylene Simmer (neuropsychology)  has followed for neurocognitive testing which revealed marked cognitive disruption with at level of disruption--MoCa score15/30. Hearing difficulties, high level of stress, fatigue and pain could have compounded her symptoms and more thorough evaluation is recommended to help with determination of dementia past discharge. Patient does not have full capacity to make decisions and was advised not to make any legal decisions at this time.  Ego support was recommended to help with high levels of stress/anxiety and this has been provided during her stay. She has had improvement in BLE strength and coordination.  She continues to make steady progress and currently requires 24 hours close supervision due to poor safety and impaired memory. Family has offered to assist but patient has elected on continued rehab at [facility]. Bed is available at [facility] on 12/17/14 and patient was discharged in improved condition.    Rehab course: During patient's stay in rehab weekly team conferences were held to monitor patient's progress, set goals and discuss barriers to discharge. Patient has had improvement in activity tolerance, balance, postural control, use of BLE as well as ability to compensate for deficits. She requires supervision for transfers and is able to ambulate household distances with close supervision and RW. She requires supervision for bathing tasks, set up assist for upper body dressing and steady assist with lower body dressing.     Disposition: Skilled Nursing Facility  Diet: Regular  Special Instructions: 1. Toilet patient every 4  hours while awake. Check post void check every 6-8 hours and cath for volumes > 350 cc. 2. Continue bowel program daily.  3. TEDs when out of bed and elevate BLE when seated.  4. Recheck BMET in 2-3 days.      Medication List    STOP taking these medications        amoxicillin 500 MG capsule  Commonly known as:  AMOXIL     HYDROcodone-acetaminophen 5-325 MG per tablet  Commonly known as:  NORCO/VICODIN     metoprolol succinate 25 MG 24 hr tablet  Commonly known as:  TOPROL XL     ondansetron 8 MG disintegrating tablet  Commonly known as:  ZOFRAN ODT     predniSONE 20 MG tablet  Commonly known as:  DELTASONE     ramipril 2.5 MG capsule  Commonly known as:  ALTACE     warfarin 5 MG tablet  Commonly known as:  COUMADIN      TAKE these medications        acetaminophen 325 MG tablet  Commonly known as:  TYLENOL  Take 1-2 tablets (325-650 mg total) by mouth every 4 (four) hours as needed for mild pain.     alendronate 35 MG tablet  Commonly known as:  FOSAMAX  Take 35 mg by mouth every 7 (seven) days. Take with a full glass of water on an empty stomach. Sunday     ALPRAZolam 0.25 MG tablet  Commonly known as:  XANAX  Take 1 tablet (0.25 mg total) by mouth 3 (three) times daily as needed for anxiety.     ALPRAZolam 0.25 MG tablet  Commonly known as:  XANAX  Take 1 tablet (0.25 mg total) by mouth at bedtime.     aspirin 81 MG EC tablet  Take 1 tablet (81 mg total) by mouth daily.     atorvastatin 10 MG tablet  Commonly known as:  LIPITOR  Take 10 mg by mouth daily.     baclofen 10 MG tablet  Commonly known as:  LIORESAL  Take 0.5 tablets (5 mg total) by mouth 4 (four) times daily as needed for muscle spasms.     bisacodyl 10 MG suppository  Commonly known as:  DULCOLAX  Place 1 suppository (10 mg total) rectally every morning.     CALCIUM + D PO  Take by mouth 2 (two) times daily.     COQ10 PO  Take by mouth daily.     digoxin 0.125 MG tablet  Commonly  known as:  LANOXIN  Take 1 tablet (0.125 mg total) by mouth daily.     FISH OIL PO  Take 2,000 mg by mouth 2 (two) times daily.     fludrocortisone 0.1 MG tablet  Commonly known as:  FLORINEF  Take 1 tablet (0.1 mg total) by mouth daily.     hyoscyamine 0.125 MG tablet  Commonly known as:  LEVSIN, ANASPAZ  Take 1 tablet (0.125 mg total) by mouth every 4 (four) hours as needed for bladder spasms or cramping.     multivitamin with minerals Tabs tablet  Take 1 tablet by mouth daily.     tamsulosin 0.4 MG Caps capsule  Commonly known as:  FLOMAX  Take 2 capsules (0.8 mg total) by mouth daily after supper.       Follow-up Information    Follow up with Ranelle OysterSWARTZ,ZACHARY T, MD On 01/22/2015.   Specialty:  Physical Medicine and Rehabilitation   Why:  Be there at  9:30 am for  10 am  appointment   Contact information:   [address], [address] [place] [address] [phone]       Follow up with Karn CassisBOTERO,ERNESTO M, MD. Call today.   Specialty:  Neurosurgery   Why:  for follow up appointment   Contact information:   [address] [place] [address] [phone]       Follow up with Cassell Clementhomas Brackbill, MD. Call today.   Specialty:  Cardiology   Why:  for follow up appointment   Contact information:   [address] [place] [phone]       Follow up with Eartha InchBADGER,MICHAEL C, MD.   Specialty:  Family Medicine   Why:  for post hospital follow up when discharged from SNF.   Contact information:   [address] [place] [address] [phone]       Signed: Jacquelynn CreeLove, Javier Gell S 12/17/2014, 11:12 AM

## 2014-12-14 NOTE — Progress Notes (Signed)
[facility] PHYSICAL MEDICINE & REHABILITATION     PROGRESS NOTE    Subjective/Complaints: Up in bathroom this morning trying to void.  A  review of systems has been performed and if not noted above is otherwise negative.   Objective: Vital Signs: Blood pressure 113/68, pulse 82, temperature 99.7 F (37.6 C), temperature source Oral, resp. rate 18, weight 43.8 kg (96 lb 9 oz), SpO2 99 %. No results found. No results for input(s): WBC, HGB, HCT, PLT in the last 72 hours. No results for input(s): NA, K, CL, GLUCOSE, BUN, CREATININE, CALCIUM in the last 72 hours.  Invalid input(s): CO CBG (last 3)  No results for input(s): GLUCAP in the last 72 hours.  Wt Readings from Last 3 Encounters:  12/12/14 43.8 kg (96 lb 9 oz)  11/13/14 48.535 kg (107 lb)  07/16/14 48.988 kg (108 lb)    Physical Exam:  Constitutional: She is oriented to person, place, and time. She appears well-developed.  HENT: oral mucosa moist, dentition good Head: Normocephalic and atraumatic.  Eyes: Conjunctivae are normal. Pupils are equal, round, and reactive to light.  Neck: Normal range of motion. Neck supple.  Cardiovascular: Normal rate.  Respiratory: Effort normal and breath sounds normal. No respiratory distress. She has no wheezes.  GI: Soft. Bowel sounds are normal. She exhibits no distension.  Abdomen Non-tender.  Musculoskeletal: She exhibits no edema or tenderness. No spasms or "knot" on chest wall Neurological: She is alert and oriented to person, place, and time. A little distracted at times Speech clear. Follows commands without difficulty. impaired proprioception/LT in both legs. Strength: 3+/5HF,KE bilaterally and 4- ADF/APF. UE's 5/5.  Skin: Skin is warm and dry. Back incision clean,dry and intact.  Psych: pleasant. Sl anxious  Assessment/Plan: 1. Functional deficits secondary to T10-12 hematoma with subsequent myelopathy and paraplegia which require 3+ hours per day of  interdisciplinary therapy in a comprehensive inpatient rehab setting. Physiatrist is providing close team supervision and 24 hour management of active medical problems listed below. Physiatrist and rehab team continue to assess barriers to discharge/monitor patient progress toward functional and medical goals.  dispo changed to SNF given family/safety considerations   FIM: FIM - Bathing Bathing Steps Patient Completed: Chest, Right Arm, Left Arm, Abdomen, Front perineal area, Buttocks, Right upper leg, Left upper leg, Right lower leg (including foot), Left lower leg (including foot) Bathing: 5: Supervision: Safety issues/verbal cues  FIM - Upper Body Dressing/Undressing Upper body dressing/undressing steps patient completed: Thread/unthread right sleeve of pullover shirt/dresss, Thread/unthread right bra strap, Thread/unthread left bra strap, Hook/unhook bra, Thread/unthread left sleeve of pullover shirt/dress, Put head through opening of pull over shirt/dress, Pull shirt over trunk Upper body dressing/undressing: 5: Set-up assist to: Obtain clothing/put away FIM - Lower Body Dressing/Undressing Lower body dressing/undressing steps patient completed: Thread/unthread right pants leg, Thread/unthread left pants leg, Pull pants up/down, Fasten/unfasten pants, Don/Doff right shoe, Don/Doff left shoe, Fasten/unfasten right shoe, Fasten/unfasten left shoe Lower body dressing/undressing: 5: Set-up assist to: Don/Doff TED stocking  FIM - Toileting Toileting steps completed by patient: Adjust clothing prior to toileting, Performs perineal hygiene, Adjust clothing after toileting Toileting Assistive Devices: Grab bar or rail for support Toileting: 5: Supervision: Safety issues/verbal cues  FIM - Diplomatic Services operational officer Devices: Grab bars, Elevated toilet seat Toilet Transfers: 0-Activity did not occur  FIM - Banker Devices: Arm rests,  Walker, Bed rails Bed/Chair Transfer: 0: Activity did not occur  FIM - Locomotion: Wheelchair Distance: 150 Locomotion:  Wheelchair: 6: Travels 150 ft or more, turns around, maneuvers to table, bed or toilet, negotiates 3% grade: maneuvers on rugs and over door sills independently FIM - Locomotion: Ambulation Locomotion: Ambulation Assistive Devices: Designer, industrial/productWalker - Rolling Ambulation/Gait Assistance: 4: Min guard Locomotion: Ambulation: 2: Travels 50 - 149 ft with minimal assistance (Pt.>75%)  Comprehension Comprehension Mode: Auditory Comprehension: 5-Follows basic conversation/direction: With no assist  Expression Expression Mode: Verbal Expression: 5-Expresses basic needs/ideas: With extra time/assistive device  Social Interaction Social Interaction: 5-Interacts appropriately 90% of the time - Needs monitoring or encouragement for participation or interaction.  Problem Solving Problem Solving: 4-Solves basic 75 - 89% of the time/requires cueing 10 - 24% of the time  Memory Memory: 4-Recognizes or recalls 75 - 89% of the time/requires cueing 10 - 24% of the time  Medical Problem List and Plan: 1. Functional deficits secondary to T 10-12 hematoma with myelopathy and paraplegia 2. PE/BLE DVT /Anticoagulation: Mechanical: Sequential compression devices, below knee Bilateral lower extremities No blood thinner due to thoracic hematoma--verified with Dr. Jordan LikesPool  3. Pain Management: Continue oxycodone prn.   -  baclofen for m/s spasms 4. Mood: LCSW to follow for evaluation and support.   -believe anxiety is a factor.(?depression) Added low dose xanax prn   -will schedule low dose xanax at night  -neuropsych appreciated---15/30 on MoCA.  Will consider memory aid once home from hospital/SNF and through the stress of this event.  -tends to sundown a little bit at night 5. Neuropsych: This patient is not quite capable of making decisions on her own behalf. 6. Skin/Wound Care:  7.  Fluids/Electrolytes/Nutrition: Monitor I/O.  Intake improving      -supps  8. Pseudomonas A UTI: cipro for 7 days 9. Neurogenic bowel:    -regular bowel regimen 10. Neurogenic bladder: i/o cath prn  -urecholine stopped due to SE  -double voids, up to toilet to void  -on 0.8 flomaxqhs  -rx uti  -showing gradual  improvement 11. Hyponatremia:  --normal 135  12. Hypotension: Will continue florinef.   -binder,teds 13. Afib: ON ASA for stroke prevention. 14. Hypokalemia:  supp,diet   LOS (Days) 15 A FACE TO FACE EVALUATION WAS PERFORMED  Judy Goodenow T 12/14/2014 7:58 AM

## 2014-12-14 NOTE — Discharge Instructions (Signed)
Inpatient Rehab Discharge Instructions  [name] Weber Discharge date and time:    Activities/Precautions/ Functional Status: Activity: activity as tolerated Diet: regular diet Wound Care: keep wound clean and dry Functional status:  ___ No restrictions     ___ Walk up steps independently ___ 24/7 supervision/assistance   ___ Walk up steps with assistance ___ Intermittent supervision/assistance  ___ Bathe/dress independently ___ Walk with walker     ___ Bathe/dress with assistance ___ Walk Independently    ___ Shower independently ___ Walk with assistance    ___ Shower with assistance ___ No alcohol     ___ Return to work/school ________  Special Instructions:    My questions have been answered and I understand these instructions. I will adhere to these goals and the provided educational materials after my discharge from the hospital.  Patient/Caregiver Signature _______________________________ Date __________  Clinician Signature _______________________________________ Date __________  Please bring this form and your medication list with you to all your follow-up doctor's appointments.

## 2014-12-14 NOTE — Progress Notes (Signed)
Physical Therapy Session Note  Patient Details  Name: [name] Weber MRN: [id number] Date of Birth: 11/12/1931  Today's Date: 12/14/2014  Short Term Goals: Week 3:  PT Short Term Goal 1 (Week 3): = LTGs   Session #1 PM: PT Individual Time: 1300-1400 PT Individual Time Calculation (min): 60 min  Reports R hip pain - at end of session applied heat pack and notified RN for pain medication. Gait with RW to and from therapy with close S/intermittent steady a for balance for functional mobility training. Focused on neuro re-ed for balance strategies on compliant surface with 1 UE and no UE support while playing table top game at S to steady A level (steady A when no UE support provided) x 2 trials. Stair negotiation using 1 rail for home entry training with min A ascending and descending sideways x 10 steps with step to pattern. Performed R hip stretching and piriformis stretch in attempt to alleviate c/o pain in R hip but with minimal results.   Session #2 PM: Time: 1430-1500 (30 min) Reports pain is alleviated. Gait with RW with close S (2 episodes of steady A for balance during turning) down to therapy gym and then back at end of session for functional mobility training. Nustep for strengthening and endurance with LE's only with focus on not allowing knees to adduct x 15 min on level 6. Positioned in w/c with LE's elevated and heat pack applied to R hip at end of session.   Therapy Documentation Precautions:  Precautions Precautions: Fall, Back Precaution Booklet Issued: No Precaution Comments: watch orthostatic BP Restrictions Weight Bearing Restrictions: No   See FIM for current functional status  Therapy/Group: Individual Therapy  [name], Kayla Weekes [facility] 12/14/2014, 3:07 PM

## 2014-12-14 NOTE — Progress Notes (Signed)
Occupational Therapy Session Note  Patient Details  Name: [name] Weber MRN: [id number] Date of Birth: 11/17/1931  Today's Date: 12/14/2014 OT Individual Time: 1500-1530 OT Individual Time Calculation (min): 30 min    Short Term Goals: Week 1:  OT Short Term Goal 1 (Week 1): Pt will be able to don pants over feet with use of reacher. OT Short Term Goal 1 - Progress (Week 1): Met OT Short Term Goal 2 (Week 1): Pt will be able to stand from w/c with mod A to be able to pull pants over hips. OT Short Term Goal 2 - Progress (Week 1): Met OT Short Term Goal 3 (Week 1): Pt will be able to transfer to a tub bench with mod A. OT Short Term Goal 3 - Progress (Week 1): Met OT Short Term Goal 4 (Week 1): Pt will be able to transfer to a BSC drop arm with mod A x 1. OT Short Term Goal 4 - Progress (Week 1): Met OT Short Term Goal 5 (Week 1): Pt will be able to bathe feet with long handled sponge. OT Short Term Goal 5 - Progress (Week 1): Met  Skilled Therapeutic Interventions/Progress Updates:    Addressed UE exercises and standing balance.  Pt. Needed minimal assist for standing balance.  No issues notes with UE exercises.    Therapy Documentation Precautions:  Precautions Precautions: Fall, Back Precaution Booklet Issued: No Precaution Comments: watch orthostatic BP Restrictions Weight Bearing Restrictions: No   Pain:   none   ADL: ADL ADL Comments: Refer to FIM Exercises:   Other Treatments:    See FIM for current functional status  Therapy/Group: Individual Therapy  Lisa Roca 12/14/2014, 5:47 PM

## 2014-12-14 NOTE — Plan of Care (Signed)
Problem: RH BOWEL ELIMINATION Goal: RH STG MANAGE BOWEL WITH ASSISTANCE STG Manage Bowel with Assistance.Total A Outcome: Progressing Pt requires daily suppository

## 2014-12-14 NOTE — Progress Notes (Signed)
Physical Therapy Session Note  Patient Details  Name: Stephani Policevelyn C Brabender MRN: [id number] Date of Birth: 02/27/1931  Today's Date: 12/14/2014 PT Individual Time: 0800-0900 PT Individual Time Calculation (min): 60 min   Short Term Goals: Week 3:  PT Short Term Goal 1 (Week 3): = LTGs   Skilled Therapeutic Interventions/Progress Updates:   Pt up in w/c at sink already upon therapist entering room. Therapist applied Tedhose and pt donned socks and shoes with set up assist. Gait with RW down to therapy gym with close S/intermittent steady A with focus on step length, gait pattern, and upright posture. Pt reports feeling a bit lightheaded after walk, BP taken = 105/52 mmHg. Repeat reading in standing = 82/50 mmHg. RN notified. Pt needing to have BM, returned to room with total A for time. See FIM for levels of A for toileting needs and transfers; cues needed for safety and set-up assistance. W/c propulsion down to Dayroom mod I for general strengthening and endurance. Dynamic and household gait with RW on carpeted surface with close S/steady A navigating obstacles including side stepping through narrow passageways (requires cues to problem solve through this technique). Reviewed LE HEP/[place] partially including mini squats and heel/toe raises in standing (limited due to time so plan to continue in next session). Pt performed 10 reps each.   Upgraded LTG for dynamic standing balance and gait to S level.   Therapy Documentation Precautions:  Precautions Precautions: Fall, Back Precaution Booklet Issued: No Precaution Comments: watch orthostatic BP Restrictions Weight Bearing Restrictions: No Pain:  Premedicated for pain.  See FIM for current functional status  Therapy/Group: Individual Therapy  [name], Jermanie Minshall [facility] 12/14/2014, 9:06 AM

## 2014-12-14 NOTE — Plan of Care (Signed)
Problem: RH PAIN MANAGEMENT Goal: RH STG PAIN MANAGED AT OR BELOW PT'S PAIN GOAL <5  Outcome: Progressing No c/o of pain at this time

## 2014-12-14 NOTE — Plan of Care (Signed)
Problem: RH BLADDER ELIMINATION Goal: RH STG MANAGE BLADDER WITH EQUIPMENT WITH ASSISTANCE STG Manage Bladder With Equipment With Assistance. Max A  Outcome: Progressing Voids, put still requires I&O cath at times

## 2014-12-14 NOTE — Progress Notes (Signed)
Occupational Therapy Weekly Progress Note  Patient Details  Name: [name] Weber MRN: [id number] Date of Birth: Jan 08, 1931  Beginning of progress report period: December 08, 2014 End of progress report period: December 14, 2014  Today's Date: 12/14/2014 OT Individual Time: 1030-1130 OT Individual Time Calculation (min): 60 min    Patient has met 2 of 4 short term goals.  Pt continues to improve with functional mobility, dynamic standing balance, attention and safety awareness but is challenged by limited endurance and recurrent need for GI symptom management and related toileting needs.  Patient continues to demonstrate the following deficits: Impaired dynamic standing balance, impaired endurance, generalized weakness and therefore will continue to benefit from skilled OT intervention to enhance overall performance with BADL.  Patient progressing toward long term goals..  Continue plan of care.  OT Short Term Goals Week 2:  OT Short Term Goal 1 (Week 2): Patient will perform stand pivot transfer <> BSC using grab bars vs RW with supervision OT Short Term Goal 1 - Progress (Week 2): Met OT Short Term Goal 2 (Week 2): Patient will perform shower transfer with supervision (consistantly) OT Short Term Goal 2 - Progress (Week 2): Met OT Short Term Goal 3 (Week 2): Patient will be able to perform simple meal prep with set-up assistance OT Short Term Goal 3 - Progress (Week 2): Partly met OT Short Term Goal 4 (Week 2): Patient will maintain dynamic standing balance with min guard assist for at least 10 minutes with one seated rest break (if needed) in prep for dynamic standing ADL tasks OT Short Term Goal 4 - Progress (Week 2): Progressing toward goal Week 3:  OT Short Term Goal 1 (Week 3): STG=LTG d/t short remaining LOS  Skilled Therapeutic Interventions/Progress Updates: Pt received supine in bed and reporting continued symptoms of GI urgency (loose and frequent BM), with diminished  endurance and LE weakness.  After brief rest, pt was willing to rise to ambulate to shower level BADL after voiding again (urine) on toilet.   Pt ambulated to toilet with min guard assist after setup to remove TEDs and apply socks.  Pt remained on toilet briefly to attempt BM and ceased attempt to proceed with shower.   Pt showered sitting and standing unassisted using only grab bar to maintain standing balance while washing buttocks.    Pt recovered to w/c after shower with standby assist for transfer and dressed at sink with setup and standby assist while standing to pull up her pants.    Pt groomed at sink to finish B & D within 45 minutes total time.   With remaining time, pt was instructed on BUE HEP using medium grade theraband (orange), written HEP and hand guidance on performance of correct exercises: 1) Seated chest press, 2) Seated Row 3) Seated Scapular Chest Pull, 4) Seated Scapular Pull Downs 5) Seated Bicep Curl  6) Seated Tricep Pull Downs.    Pt left seated in w/c at end of session awaiting lunch with call light within reach.     Therapy Documentation Precautions:  Precautions Precautions: Fall, Back Precaution Booklet Issued: No Precaution Comments: watch orthostatic BP Restrictions Weight Bearing Restrictions: No  ADL: ADL ADL Comments: Refer to FIM  See FIM for current functional status  Therapy/Group: Individual Therapy  [name] Weber 12/14/2014, 1:46 PM

## 2014-12-15 ENCOUNTER — Inpatient Hospital Stay (HOSPITAL_COMMUNITY): Payer: Medicare Other | Admitting: *Deleted

## 2014-12-15 NOTE — Progress Notes (Signed)
Patient ID: EMA HEBNER, female   DOB: May 04, 1931, 81 y.o.   MRN: [id number]   [facility] PHYSICAL MEDICINE & REHABILITATION     PROGRESS NOTE   12/15/14.  81 y/o admit with functional deficits secondary to T 10-12 hematoma with myelopathy and paraplegia Subjective/Complaints:  Comfortable night.  Excited about transfer to [facility] in a few days Up in bathroom this morning trying to void.  A  review of systems has been performed and if not noted above is otherwise negative.  Past Medical History  Diagnosis Date  . Paroxysmal atrial flutter   . Palpitations     OCCASSIONAL  . PAC (premature atrial contraction)     ISOLATED  . PVC's (premature ventricular contractions)     ISOLATED  . Malaise   . Fatigue   . Myalgia     Intake/Output Summary (Last 24 hours) at 12/15/14 0842 Last data filed at 12/15/14 0000  Gross per 24 hour  Intake    240 ml  Output   1625 ml  Net  -1385 ml    Patient Vitals for the past 24 hrs:  BP Temp Temp src Pulse Resp SpO2  12/15/14 0834 - - - 91 - -  12/15/14 0436 - 98.9 F (37.2 C) Oral - - 96 %  12/14/14 1402 (!) 114/57 mmHg 97.6 F (36.4 C) Oral (!) 117 18 98 %        Objective: Vital Signs: Blood pressure 114/57, pulse 91, temperature 98.9 F (37.2 C), temperature source Oral, resp. rate 18, weight 43.8 kg (96 lb 9 oz), SpO2 96 %. No results found. No results for input(s): WBC, HGB, HCT, PLT in the last 72 hours. No results for input(s): NA, K, CL, GLUCOSE, BUN, CREATININE, CALCIUM in the last 72 hours.  Invalid input(s): CO CBG (last 3)  No results for input(s): GLUCAP in the last 72 hours.  Wt Readings from Last 3 Encounters:  12/12/14 43.8 kg (96 lb 9 oz)  11/13/14 48.535 kg (107 lb)  07/16/14 48.988 kg (108 lb)    Physical Exam:  Constitutional: She is oriented to person, place, and time. She appears well-developed.  HENT: oral mucosa moist, dentition good Head: Normocephalic and atraumatic.  Eyes:  Conjunctivae are normal. Pupils are equal, round, and reactive to light.  Neck: Normal range of motion. Neck supple.  Cardiovascular: Normal rate. Irregular Respiratory: Effort normal and breath sounds normal. No respiratory distress. She has no wheezes.  GI: Soft. Bowel sounds are normal. She exhibits no distension.  Abdomen Non-tender.  Musculoskeletal: She exhibits no edema or tenderness. No spasms or "knot" on chest wall Neurological: She is alert and oriented to person, place, and time. A little distracted at times Speech clear. Follows commands without difficulty. impaired proprioception/LT in both legs. Strength: 3+/5HF,KE bilaterally and 4- ADF/APF. UE's 5/5.  Skin: Skin is warm and dry. Back incision clean,dry and intact.  Psych: pleasant. Sl anxious  Assessment/Plan: 1. Functional deficits secondary to T 10-12 hematoma with myelopathy and paraplegia 2. PE/BLE DVT /Anticoagulation: Mechanical: Sequential compression devices, below knee Bilateral lower extremities No blood thinner due to thoracic hematoma--verified with Dr. Jordan Likes  3. Pain Management: Continue oxycodone prn.   -  baclofen for m/s spasms 4. Mood: LCSW to follow for evaluation and support.   -believe anxiety is a factor.(?depression) Added low dose xanax prn   -will schedule low dose xanax at night  -neuropsych appreciated---15/30 on MoCA.  Will consider memory aid once home from hospital/SNF  and through the stress of this event.  -tends to sundown a little bit at night   5. Pseudomonas A UTI: cipro for 7 days 6. Neurogenic bowel:    -regular bowel regimen 7. Neurogenic bladder: i/o cath prn  -urecholine stopped due to SE  -double voids, up to toilet to void  -on 0.8 flomaxqhs  -rx uti  -showing gradual  improvement  8. Afib: ON ASA for stroke prevention. 9. Hypokalemia:  supp,diet   LOS (Days) 16 A FACE TO FACE EVALUATION WAS PERFORMED  [name],Stryder Poitra FRANK 12/15/2014 8:39 AM

## 2014-12-15 NOTE — Progress Notes (Signed)
Physical Therapy Session Note  Patient Details  Name: [name] Weber MRN: [id number] Date of Birth: 06/22/1931  Today's Date: 12/15/2014 PT Individual Time: 1300-1400 PT Individual Time Calculation (min): 60 min     Skilled Therapeutic Interventions/Progress Updates:  Patient in room sitting in w/c ready for therapy, states no pain or discomfort.Assistance with donning TED hose and independent putting on shoes on. Gait training to the gym with SBA , repeated episodes of L knee buckling w/o LOB.  NuStep training 1x10 min on resistance of 7 with no rest breaks-to facilitate strength.  Balance training on BIODEX to increase ability to weight shift and control posture in standing and with transitional movements. MAZE setting 1 x level 11 with UE support and mod A, 1 x level 12 no UE support with min A and occ VC. Training on stairs 1 x 10 steps, sideways with min A ascending and mod A descending. Patient reported feeling fatigued and light headed. VS: BP 125/51 HR 66( initially than very erratic and difficult to measure even manually),Patient returned to room and nursing reported. _Patient stated she feels ok and does not feel any different in her chest. Patient observed for few minutes ,appears to be doing ok. Nursing aware of situation.  Patient left in a room with a friend who came to visit, all needs within reach.  Therapy Documentation Precautions:  Precautions Precautions: Fall, Back Precaution Booklet Issued: No Precaution Comments: watch orthostatic BP Restrictions Weight Bearing Restrictions: No Vital Signs: Therapy Vitals Temp: 98.2 F (36.8 C) Temp Source: Oral Pulse Rate: 100 Resp: 18 BP: 127/79 mmHg Oxygen Therapy SpO2: 100 %  See FIM for current functional status  Therapy/Group: Individual Therapy  Dorna MaiCzajkowska, Velma Agnes W 12/15/2014, 2:59 PM

## 2014-12-16 ENCOUNTER — Inpatient Hospital Stay (HOSPITAL_COMMUNITY): Payer: Medicare Other | Admitting: *Deleted

## 2014-12-16 ENCOUNTER — Inpatient Hospital Stay (HOSPITAL_COMMUNITY): Payer: Medicare Other

## 2014-12-16 NOTE — Progress Notes (Signed)
Patient ID: [name] Weber, female   DOB: 08/30/1931, 81 y.o.   MRN: [id number]   Patient ID: [name] Weber, female   DOB: 12/27/1930, 81 y.o.   MRN: [id number]   [facility] PHYSICAL MEDICINE & REHABILITATION     PROGRESS NOTE   12/16/14.  81 y/o admit with functional deficits secondary to T 10-12 hematoma with myelopathy and paraplegia Subjective/Complaints:  Comfortable night.  Excited about [facility] AM Up in bathroom this morning trying to void.  A  review of systems has been performed and if not noted above is otherwise negative.  Past Medical History  Diagnosis Date  . Paroxysmal atrial flutter   . Palpitations     OCCASSIONAL  . PAC (premature atrial contraction)     ISOLATED  . PVC's (premature ventricular contractions)     ISOLATED  . Malaise   . Fatigue   . Myalgia     Intake/Output Summary (Last 24 hours) at 12/16/14 0821 Last data filed at 12/16/14 0041  Gross per 24 hour  Intake    600 ml  Output    375 ml  Net    225 ml    Patient Vitals for the past 24 hrs:  BP Temp Temp src Pulse Resp SpO2  12/16/14 0556 - 98.4 F (36.9 C) Oral - - -  12/15/14 1419 127/79 mmHg 98.2 F (36.8 C) Oral 100 18 100 %  12/15/14 0834 - - - 91 - -        Objective: Vital Signs: Blood pressure 127/79, pulse 100, temperature 98.4 F (36.9 C), temperature source Oral, resp. rate 18, weight 43.8 kg (96 lb 9 oz), SpO2 100 %. No results found. No results for input(s): WBC, HGB, HCT, PLT in the last 72 hours. No results for input(s): NA, K, CL, GLUCOSE, BUN, CREATININE, CALCIUM in the last 72 hours.  Invalid input(s): CO CBG (last 3)  No results for input(s): GLUCAP in the last 72 hours.  Wt Readings from Last 3 Encounters:  12/12/14 43.8 kg (96 lb 9 oz)  11/13/14 48.535 kg (107 lb)  07/16/14 48.988 kg (108 lb)    Physical Exam:  Constitutional: She is oriented to person, place, and time. She appears well-developed.  HENT: oral mucosa  moist, dentition good Head: Normocephalic and atraumatic.  Eyes: Conjunctivae are normal. Pupils are equal, round, and reactive to light.  Neck: Normal range of motion. Neck supple.  Cardiovascular: Normal rate. Irregular Respiratory: Effort normal and breath sounds normal. No respiratory distress. She has no wheezes.  GI: Soft. Bowel sounds are normal. She exhibits no distension.  Abdomen Non-tender.  Musculoskeletal: She exhibits no edema or tenderness. No spasms or "knot" on chest wall Neurological: She is alert and oriented to person, place, and time. A little distracted at times Speech clear. Follows commands without difficulty. Skin: Skin is warm and dry. Back incision clean,dry and intact.  Psych: pleasant. Sl anxious  Assessment/Plan: 1. Functional deficits secondary to T 10-12 hematoma with myelopathy and paraplegia 2. PE/BLE DVT /Anticoagulation: Mechanical: Sequential compression devices, below knee Bilateral lower extremities No blood thinner due to thoracic hematoma--verified with Dr. Jordan LikesPool   3. Pseudomonas A UTI: Completed cipro for 7 days 6. Neurogenic bowel:    -regular bowel regimen 7. Neurogenic bladder: i/o cath prn  -urecholine stopped due to SE  -double voids, up to toilet to void  -on 0.8 flomaxqhs  -rx uti  -showing gradual  improvement  8. Afib: ON  ASA for stroke prevention.  Controlled VR    LOS (Days) 17 A FACE TO FACE EVALUATION WAS PERFORMED  Rogelia Boga 12/16/2014 8:21 AM

## 2014-12-16 NOTE — Progress Notes (Signed)
Physical Therapy Discharge Summary  Patient Details  Name: [name] Weber MRN: [id number] Date of Birth: 02/02/1931  Today's Date: 12/16/2014   Patient has met 11 of 11 long term goals due to improved activity tolerance, improved balance, improved postural control, increased strength, decreased pain, ability to compensate for deficits, functional use of  right lower extremity and left lower extremity, improved awareness and improved coordination.  Patient to discharge at an ambulatory level Supervision to steady A with RW. Pt requires min A for stair negotiation.  Pt is mod I for w/c mobility. Pt does not have 24/7 supervision support at home and therefore, d/c plan is to SNF for short term rehab.   Reasons goals not met: n/a all goals met at this time.  Recommendation:  Patient will benefit from ongoing skilled PT services in skilled nursing facility setting to continue to advance safe functional mobility, address ongoing impairments in gait, balance, coordination, strength, motor control, endurance, functional mobility, and minimize fall risk.  Equipment: To be deferred to next venue of care  Reasons for discharge: treatment goals met  Patient/family agrees with progress made and goals achieved: Yes  PT Discharge Precautions/Restrictions Precautions Precautions: Fall;Back Precaution Comments: watch orthostatic BP Restrictions Weight Bearing Restrictions: No    Cognition Overall Cognitive Status: Impaired/Different from baseline Arousal/Alertness: Awake/alert Memory: Impaired Memory Impairment: Decreased recall of new information Safety/Judgment: Appears intact Comments: Recommend 24/7 S for safety due to decreased memory and cues for safety needed. Cues at times for maintaining back precautions Sensation Sensation Light Touch: Appears Intact Proprioception: Appears Intact Additional Comments: Reports slight decrease on top of thighs bilaterally but reports overall  improvement since admission Coordination Gross Motor Movements are Fluid and Coordinated: No (still some decreased coordination in BLE noted) Motor  Motor Motor: Ataxia;Other (comment) (paraparesis) Motor - Discharge Observations: improved strength and contol in trunk and LE's since admission  Locomotion  Ambulation Ambulation/Gait Assistance: 5: Supervision  Trunk/Postural Assessment  Cervical Assessment Cervical Assessment: Within Functional Limits Thoracic Assessment Thoracic Assessment:  (back precautions; not fully tested) Lumbar Assessment Lumbar Assessment: Exceptions to [facility] (posterior pelvic tilt; back precautions limit full testing) Postural Control Postural Control: Deficits on evaluation Protective Responses: decreased in standing without UE support Postural Limitations: decreased due to precautions and in standing without UE support  Balance Balance Balance Assessed: Yes Static Sitting Balance Static Sitting - Level of Assistance: 6: Modified independent (Device/Increase time) Dynamic Sitting Balance Dynamic Sitting - Level of Assistance: 6: Modified independent (Device/Increase time) Static Standing Balance Static Standing - Level of Assistance: 5: Stand by assistance (with RW) Dynamic Standing Balance Dynamic Standing - Level of Assistance: 5: Stand by assistance (with RW) Extremity Assessment      RLE Assessment RLE Assessment: Exceptions to [facility] RLE Strength RLE Overall Strength Comments: 4-/5 overal except 3+5 for hip flexion; slight decrease in coordination with gait and standing LLE Assessment LLE Assessment: Exceptions to [facility] LLE Strength LLE Overall Strength Comments: 4-/5 overall except 3+/5 hip flexion; slightly decreased coordination noted with gait and standing  See FIM for current functional status  Canary Brim [facility] 12/16/2014, 11:34 AM

## 2014-12-16 NOTE — Progress Notes (Signed)
Physical Therapy Session Note  Patient Details  Name: Stephani Policevelyn C Barrows MRN: [id number] Date of Birth: 05/28/1931  Today's Date: 12/16/2014 PT Individual Time: 0800-0900 PT Individual Time Calculation (min): 60 min   Short Term Goals: Week 3:  PT Short Term Goal 1 (Week 3): = LTGs   Skilled Therapeutic Interventions/Progress Updates:   Session focused on functional mobility for Grad Day activities including gait with RW (close S with emphasis on posture, safety with RW, and step length), simulated car transfer to SUV height in preparation for d/c tomorrow (close S using RW for transfer and education on technique for car vs SUV), stair negotiation training using 1 rail to simulate home entry (min A needed intermittently for balance), bed mobility and transfers in ADL apartment to simulate home environment mobility with overall S for safety, reviewed LE [place] for functional strengthening and balance training (performed 10 reps each with cues for technique BLE; hip abduction, hamstring curls, heel/toe raises, mini squats and LAQ), w/c mobility for UE strength and endurance, and d/c assessment. Pt performing at overall S to steady A level using RW and demonstrating improvement with safe technique and gait distance. Pt is ready for d/c to SNF tomorrow and denies any further therapy concerns. See discharge summary for complete d/c assessment details and FIM for functional mobility status.   Therapy Documentation Precautions:  Precautions Precautions: Fall, Back Precaution Booklet Issued: No Precaution Comments: watch orthostatic BP Restrictions Weight Bearing Restrictions: No  Pain:  Denies pain beginning of session. End of session applied heat to R hip for discomfort.   See FIM for current functional status  Therapy/Group: Individual Therapy  [name], Jeannett [facility] 12/16/2014, 10:02 AM

## 2014-12-16 NOTE — Progress Notes (Signed)
Occupational Therapy Session Note  Patient Details  Name: BRENAE LASECKI MRN: [id number] Date of Birth: 10/13/1931  Today's Date: 12/16/2014 OT Individual Time: 1000-1100 OT Individual Time Calculation (min): 60 min    Short Term Goals: Week 1:  OT Short Term Goal 1 (Week 1): Pt will be able to don pants over feet with use of reacher. OT Short Term Goal 1 - Progress (Week 1): Met OT Short Term Goal 2 (Week 1): Pt will be able to stand from w/c with mod A to be able to pull pants over hips. OT Short Term Goal 2 - Progress (Week 1): Met OT Short Term Goal 3 (Week 1): Pt will be able to transfer to a tub bench with mod A. OT Short Term Goal 3 - Progress (Week 1): Met OT Short Term Goal 4 (Week 1): Pt will be able to transfer to a BSC drop arm with mod A x 1. OT Short Term Goal 4 - Progress (Week 1): Met OT Short Term Goal 5 (Week 1): Pt will be able to bathe feet with long handled sponge. OT Short Term Goal 5 - Progress (Week 1): Met Week 2:  OT Short Term Goal 1 (Week 2): Patient will perform stand pivot transfer <> BSC using grab bars vs RW with supervision OT Short Term Goal 1 - Progress (Week 2): Met OT Short Term Goal 2 (Week 2): Patient will perform shower transfer with supervision (consistantly) OT Short Term Goal 2 - Progress (Week 2): Met OT Short Term Goal 3 (Week 2): Patient will be able to perform simple meal prep with set-up assistance OT Short Term Goal 3 - Progress (Week 2): Partly met OT Short Term Goal 4 (Week 2): Patient will maintain dynamic standing balance with min guard assist for at least 10 minutes with one seated rest break (if needed) in prep for dynamic standing ADL tasks OT Short Term Goal 4 - Progress (Week 2): Progressing toward goal  Skilled Therapeutic Interventions/Progress Updates:    Pt. Engaged in therapeutic bathing and dressing with transfers >wc>tub bench>wc with close supervision using grab bars.  OT assisted pt with sit to stand x4 for functional  bathing/dressing with SBA.  OT reinforce back precautions.  Pt able to bend legs up to chest for reaching feet.  Left pt in wc with call bell,phone within reach.     Therapy Documentation Precautions:  Precautions Precautions: Fall, Back Precaution Booklet Issued: No Precaution Comments: watch orthostatic BP Restrictions Weight Bearing Restrictions: No       Pain: Pain Assessment Pain Score: 0-No pain Faces Pain Scale: No hurt PAINAD (Pain Assessment in Advanced Dementia) Breathing: normal ADL: ADL ADL Comments: Refer to FIM        See FIM for current functional status  Therapy/Group: Individual Therapy  Lisa Roca 12/16/2014, 11:16 AM

## 2014-12-17 MED ORDER — ADULT MULTIVITAMIN W/MINERALS CH: 1.0000 | ORAL_TABLET | Freq: Every day | ORAL | Status: DC

## 2014-12-17 MED ORDER — DIGOXIN 125 MCG PO TABS: 0.1250 mg | ORAL_TABLET | Freq: Every day | ORAL | Status: DC

## 2014-12-17 MED ORDER — ACETAMINOPHEN 325 MG PO TABS: 325.0000 mg | ORAL_TABLET | ORAL | Status: DC | PRN

## 2014-12-17 MED ORDER — BISACODYL 10 MG RE SUPP: 10.0000 mg | Freq: Every morning | RECTAL | Status: DC

## 2014-12-17 MED ORDER — ALPRAZOLAM 0.25 MG PO TABS: 0.2500 mg | ORAL_TABLET | Freq: Three times a day (TID) | ORAL | Status: DC | PRN

## 2014-12-17 MED ORDER — HYOSCYAMINE SULFATE 0.125 MG PO TABS: 0.1250 mg | ORAL_TABLET | ORAL | Status: DC | PRN

## 2014-12-17 MED ORDER — ASPIRIN 81 MG PO TBEC: 81.0000 mg | DELAYED_RELEASE_TABLET | Freq: Every day | ORAL | Status: DC

## 2014-12-17 MED ORDER — TAMSULOSIN HCL 0.4 MG PO CAPS: 0.8000 mg | ORAL_CAPSULE | Freq: Every day | ORAL | Status: DC

## 2014-12-17 MED ORDER — ALPRAZOLAM 0.25 MG PO TABS: 0.2500 mg | ORAL_TABLET | Freq: Every day | ORAL | Status: DC

## 2014-12-17 MED ORDER — BACLOFEN 10 MG PO TABS: 5.0000 mg | ORAL_TABLET | Freq: Four times a day (QID) | ORAL | Status: DC | PRN

## 2014-12-17 MED ORDER — FLUDROCORTISONE ACETATE 0.1 MG PO TABS: 0.1000 mg | ORAL_TABLET | Freq: Every day | ORAL | Status: DC

## 2014-12-17 NOTE — Progress Notes (Signed)
Social Work Patient ID: [name] Weber, female   DOB: [birth year], 81 y.o.   MRN: [id number] Met with pt's two friend ( Heart sisters) who are concerned regarding pt 's medically readiness for transfer to NH today.  Concerned about swollen ankle, retaining fluid-I & O cath and her a-fib.  Have asked pam-PA to answer and address their concerns.  Work toward discharge.

## 2014-12-17 NOTE — Progress Notes (Signed)
[facility] Discharge instructions provided by Marissa NestlePam Love, PA. Pt escorted off unit in w/c with personal belonging by Kelle DartingAbe, NT. Attempted to call [facility] to provide report to facility. [facility], who gave me a contact number of [phone] for report. Left message for a return call. No return call at this time.

## 2014-12-17 NOTE — Progress Notes (Signed)
[facility] PHYSICAL MEDICINE & REHABILITATION     PROGRESS NOTE    Subjective/Complaints: A little anxious about leaving today.  A  review of systems has been performed and if not noted above is otherwise negative.   Objective: Vital Signs: Blood pressure 134/61, pulse 76, temperature 98.7 F (37.1 C), temperature source Oral, resp. rate 18, weight 43.8 kg (96 lb 9 oz), SpO2 97 %. No results found. No results for input(s): WBC, HGB, HCT, PLT in the last 72 hours. No results for input(s): NA, K, CL, GLUCOSE, BUN, CREATININE, CALCIUM in the last 72 hours.  Invalid input(s): CO CBG (last 3)  No results for input(s): GLUCAP in the last 72 hours.  Wt Readings from Last 3 Encounters:  12/12/14 43.8 kg (96 lb 9 oz)  11/13/14 48.535 kg (107 lb)  07/16/14 48.988 kg (108 lb)    Physical Exam:  Constitutional: She is oriented to person, place, and time. She appears well-developed.  HENT: oral mucosa moist, dentition good Head: Normocephalic and atraumatic.  Eyes: Conjunctivae are normal. Pupils are equal, round, and reactive to light.  Neck: Normal range of motion. Neck supple.  Cardiovascular: Normal rate.  Respiratory: Effort normal and breath sounds normal. No respiratory distress. She has no wheezes.  GI: Soft. Bowel sounds are normal. She exhibits no distension.  Abdomen Non-tender.  Musculoskeletal: She exhibits no edema or tenderness. No spasms or "knot" on chest wall Neurological: She is alert and oriented to person, place, and time. A little distracted at times Speech clear. Follows commands without difficulty. impaired proprioception/LT in both legs. Strength: 3+/5HF,KE bilaterally and 4- ADF/APF. UE's 5/5.  Skin: Skin is warm and dry. Back incision clean,dry and intact.  Psych: pleasant. Sl anxious  Assessment/Plan: 1. Functional deficits secondary to T10-12 hematoma with subsequent myelopathy and paraplegia which require 3+ hours per day of interdisciplinary  therapy in a comprehensive inpatient rehab setting. Physiatrist is providing close team supervision and 24 hour management of active medical problems listed below. Physiatrist and rehab team continue to assess barriers to discharge/monitor patient progress toward functional and medical goals.  To [facility] today   FIM: FIM - Bathing Bathing Steps Patient Completed: Chest, Right Arm, Left Arm, Abdomen, Front perineal area, Buttocks, Right upper leg, Left upper leg, Right lower leg (including foot), Left lower leg (including foot) Bathing: 5: Supervision: Safety issues/verbal cues  FIM - Upper Body Dressing/Undressing Upper body dressing/undressing steps patient completed: Thread/unthread right sleeve of pullover shirt/dresss, Thread/unthread right bra strap, Thread/unthread left bra strap, Hook/unhook bra, Thread/unthread left sleeve of pullover shirt/dress, Put head through opening of pull over shirt/dress, Pull shirt over trunk Upper body dressing/undressing: 5: Set-up assist to: Obtain clothing/put away FIM - Lower Body Dressing/Undressing Lower body dressing/undressing steps patient completed: Thread/unthread right pants leg, Thread/unthread left pants leg, Pull pants up/down, Fasten/unfasten pants, Don/Doff right shoe, Don/Doff left shoe, Fasten/unfasten right shoe, Fasten/unfasten left shoe Lower body dressing/undressing: 4: Steadying Assist  FIM - Toileting Toileting steps completed by patient: Adjust clothing prior to toileting, Performs perineal hygiene Toileting Assistive Devices: Grab bar or rail for support Toileting: 0: Activity did not occur  FIM - Diplomatic Services operational officerToilet Transfers Toilet Transfers Assistive Devices: Grab bars, Elevated toilet seat Toilet Transfers: 0-Activity did not occur  FIM - BankerBed/Chair Transfer Bed/Chair Transfer Assistive Devices: Arm rests, Therapist, occupationalWalker Bed/Chair Transfer: 6: Supine > Sit: No assist, 6: Sit > Supine: No assist, 5: Bed > Chair or W/C: Supervision (verbal  cues/safety issues), 5: Chair or W/C > Bed:  Supervision (verbal cues/safety issues)  FIM - Locomotion: Wheelchair Distance: 150 Locomotion: Wheelchair: 6: Travels 150 ft or more, turns around, maneuvers to table, bed or toilet, negotiates 3% grade: maneuvers on rugs and over door sills independently FIM - Locomotion: Ambulation Locomotion: Ambulation Assistive Devices: Designer, industrial/product Ambulation/Gait Assistance: 5: Supervision Locomotion: Ambulation: 5: Travels 150 ft or more with supervision/safety issues  Comprehension Comprehension Mode: Auditory Comprehension: 6-Follows complex conversation/direction: With extra time/assistive device  Expression Expression Mode: Verbal Expression: 7-Expresses complex ideas: With no assist  Social Interaction Social Interaction: 6-Interacts appropriately with others with medication or extra time (anti-anxiety, antidepressant).  Problem Solving Problem Solving: 5-Solves complex 90% of the time/cues < 10% of the time  Memory Memory: 5-Recognizes or recalls 90% of the time/requires cueing < 10% of the time  Medical Problem List and Plan: 1. Functional deficits secondary to T 10-12 hematoma with myelopathy and paraplegia 2. PE/BLE DVT /Anticoagulation: Mechanical: Sequential compression devices, below knee Bilateral lower extremities No blood thinner due to thoracic hematoma--verified with Dr. Jordan Likes  3. Pain Management: Continue oxycodone prn.   -  baclofen for m/s spasms 4. Mood: LCSW to follow for evaluation and support.   -anxiety   -will schedule low dose xanax at night  -neuropsych appreciated---15/30 on MoCA.  Will consider memory aid once home from hospital/SNF and through the stress of this event.  -tends to sundown a little bit at night 5. Neuropsych: This patient is not quite capable of making decisions on her own behalf. 6. Skin/Wound Care:  7. Fluids/Electrolytes/Nutrition: Monitor I/O.  Intake improving      -supps  8.  Pseudomonas A UTI: cipro for 7 days 9. Neurogenic bowel:    -regular bowel regimen 10. Neurogenic bladder: i/o cath prn     -double voids, up to toilet to void  -on 0.8 flomaxqhs  -rx'ed uti  -gradual  improvement 11. Hyponatremia:  --normal 135  12. Hypotension: Will continue florinef.   -binder,teds 13. Afib: ON ASA for stroke prevention. 14. Hypokalemia:  supp,diet   LOS (Days) 18 A FACE TO FACE EVALUATION WAS PERFORMED  SWARTZ,ZACHARY T 12/17/2014 8:00 AM

## 2014-12-17 NOTE — Progress Notes (Signed)
Social Work Discharge Note Discharge Note  The overall goal for the admission was met for:   Discharge location: No-[facility]-SNF  Length of Stay: No-18 DAYS  Discharge activity level: Yes-SUPERVISION/MIN LEVEL  Home/community participation: Yes  Services provided included: MD, RD, PT, OT, RN, CM, TR, Pharmacy, Neuropsych and SW  Financial Services: Private Insurance: [facility]  Follow-up services arranged: Other: NHP  Comments (or additional information):ALL FEEL SHORT TERM NHP BEST OPTION. FRIENDS AND SISTER CONCERNED PT'S NOT READY FOR TRANSFER TO NH, DUE TO CATH'S, A-FIB & SWOLLEN ANKLE. PAM-PA ADDRESSED THE ISSUES BEFORE SHE LEFT. AWARE OF RIGHT TO APPEAL MEDICARE AND DECLINED THIS. SISTER AND FRIENDS TRANSPORTED TO NH  Patient/Family verbalized understanding of follow-up arrangements: Yes  Individual responsible for coordination of the follow-up plan: NIECE & FAMILY  Confirmed correct DME delivered: Elease Hashimoto 12/17/2014    Elease Hashimoto

## 2014-12-17 NOTE — Progress Notes (Signed)
Recreational Therapy Discharge Summary Patient Details  Name: [name] Weber MRN: [id number] Date of Birth: Oct 29, 1931 Today's Date: 12/17/2014  Long term goals set: 1  Long term goals met: 1  Comments on progress toward goals: Pt has made good progress toward LTGs during LOS.  Pt is discharging to SNF today for continued therapies & 24 hour supervision/assist.  Pt is able to complete TR tasks seated w/c level with  Mod I.    Reasons for discharge: discharge from hospital Patient/family agrees with progress made and goals achieved: Yes  Deerica Waszak 12/17/2014, 1:23 PM

## 2014-12-17 NOTE — Progress Notes (Signed)
Social Work Patient ID: Stephani PoliceEvelyn C Schriner, female   DOB: 04/04/1931, 81 y.o.   MRN: [id number] Family and pt spoke with Pam-PA to address questions and concerns, aware of the option to appeal Medicare and this discharge.  Gave them form in which to do this.  [facility] and see how she does. At this point plan is to move forward with discharge to [facility] today.  Awaiting admissions-Crystal from [facility] to come and proceed with admission paper work.

## 2014-12-18 ENCOUNTER — Non-Acute Institutional Stay (SKILLED_NURSING_FACILITY): Payer: Medicare Other | Admitting: Adult Health

## 2014-12-18 ENCOUNTER — Encounter: Payer: Self-pay | Admitting: Adult Health

## 2014-12-18 DIAGNOSIS — N319 Neuromuscular dysfunction of bladder, unspecified: Secondary | ICD-10-CM

## 2014-12-18 DIAGNOSIS — M810 Age-related osteoporosis without current pathological fracture: Secondary | ICD-10-CM

## 2014-12-18 DIAGNOSIS — K592 Neurogenic bowel, not elsewhere classified: Secondary | ICD-10-CM

## 2014-12-18 DIAGNOSIS — G8220 Paraplegia, unspecified: Secondary | ICD-10-CM

## 2014-12-18 DIAGNOSIS — E7800 Pure hypercholesterolemia, unspecified: Secondary | ICD-10-CM

## 2014-12-18 DIAGNOSIS — G839 Paralytic syndrome, unspecified: Secondary | ICD-10-CM

## 2014-12-18 DIAGNOSIS — I4892 Unspecified atrial flutter: Secondary | ICD-10-CM

## 2014-12-18 DIAGNOSIS — F411 Generalized anxiety disorder: Secondary | ICD-10-CM

## 2014-12-18 DIAGNOSIS — I951 Orthostatic hypotension: Secondary | ICD-10-CM

## 2014-12-18 NOTE — Progress Notes (Signed)
Patient ID: [name] Weber, female   DOB: Mar 02, 1931, 81 y.o.   MRN: [id number]   12/18/2014  Facility:  Nursing Home Location:  [facility] Room Number: 902-P LEVEL OF CARE:  SNF (31)   Chief Complaint  Patient presents with  . Hospitalization Follow-up    Paraplegia at T9 level, neurogenic bladder, hypertension, atrial flutter, hyperlipidemia, anxiety, osteoporosis and neurogenic bowel    HISTORY OF PRESENT ILLNESS:  An 81 year old female who has been admitted to [facility] on 12/17/14 thrombosis from [facility]. She has past medical history of PAF on Coumadin and hyperlipidemia. She was admitted to the hospital with new low back pain, weakness and inability to walk. MRI of spine shows abnormal cord signal due to hemorrhage/hematoma in the ventral spinal canal T10-T12 causing spinal cord compression. Coumadin was reversed and had thoracic laminectomy T11-T12, decompression of epidural space and evacuation of SDH. Postop was complicated by atrial flutter and hypotension. She was found to have RLL PE and DVT in CFV and gastrocnemius and L-DVT in soleus vein. IVC filter placed on 12/29 patient was cleared to start aspirin for stroke prevention per Dr. West Carbo.  She had inpatient rehabilitation for BLE strengthening and coordination.  She has been admitted here at [facility] for continued short-term rehabilitation.   REASSESSMENT OF ONGOING PROBLEMS:  PAST MEDICAL HISTORY:  Past Medical History  Diagnosis Date  . Paroxysmal atrial flutter   . Palpitations     OCCASSIONAL  . PAC (premature atrial contraction)     ISOLATED  . PVC's (premature ventricular contractions)     ISOLATED  . Malaise   . Fatigue   . Myalgia     CURRENT MEDICATIONS: Reviewed per MAR/see medication list  Allergies  Allergen Reactions  . Crestor [Rosuvastatin Calcium]      REVIEW OF SYSTEMS:  GENERAL: no change in appetite, no fatigue, no weight  changes, no fever, chills or weakness RESPIRATORY: no cough, SOB, DOE, wheezing, hemoptysis CARDIAC: no chest pain, edema or palpitations GI: no abdominal pain, diarrhea, constipation, heart burn, nausea or vomiting  PHYSICAL EXAMINATION  GENERAL: no acute distress, normal body habitus EYES: conjunctivae normal, sclerae normal, normal eye lids NECK: supple, trachea midline, no neck masses, no thyroid tenderness, no thyromegaly LYMPHATICS: no LAN in the neck, no supraclavicular LAN RESPIRATORY: breathing is even & unlabored, BS CTAB CARDIAC: Irregularly irregular, no murmur,no extra heart sounds, no edema GI: abdomen soft, normal BS, no masses, no tenderness, no hepatomegaly, no splenomegaly EXTREMITIES: able to move  X 4 extremities PSYCHIATRIC: the patient is alert & oriented to person, affect & behavior appropriate  LABS/RADIOLOGY: Labs reviewed: Basic Metabolic Panel:  Recent Labs  40/98/11 0500 12/05/14 0555 12/10/14 1013  NA 135 136 133*  K 3.9 3.8 3.5  CL 100 102 98  CO2 29 27 32  GLUCOSE 96 96 112*  BUN 5* 5* <5*  CREATININE 0.55 0.60 0.65  CALCIUM 8.2* 8.2* 8.3*   Liver Function Tests:  Recent Labs  11/16/14 1000 11/30/14 0713 12/05/14 0555  AST 41* 25 38*  ALT 33 17 30  ALKPHOS 56 66 69  BILITOT 0.6 0.6 0.6  PROT 7.0 5.0* 5.2*  ALBUMIN 3.6 2.2* 2.5*   CBC:  Recent Labs  11/26/14 1015  11/30/14 0713 12/05/14 0555 12/10/14 1013  WBC 5.9  < > 5.5 3.8* 4.9  NEUTROABS 4.3  --  3.8  --  3.4  HGB 11.7*  < > 11.6* 11.9* 12.5  HCT 34.4*  < > 34.4* 35.9* 38.1  MCV 96.6  < > 98.0 98.4 97.7  PLT 193  < > 220 209 217  < > = values in this interval not displayed.  Dg Thoracic Spine 2 View  11/26/2014   CLINICAL DATA:  Mid back pain aggravated by movement.  EXAM: THORACIC SPINE - 2 VIEW  COMPARISON:  November 13, 2014.  FINDINGS: There is no evidence of thoracic spine fracture. Alignment is normal. Mild degenerative changes are noted in the lower thoracic  spine.  IMPRESSION: No acute abnormality seen in the thoracic spine.   Electronically Signed   By: Roque Lias M.D.   On: 11/26/2014 14:22   Dg Lumbar Spine 2-3 Views  11/26/2014   CLINICAL DATA:  Mid and lower back pain.  EXAM: LUMBAR SPINE - 2-3 VIEW  COMPARISON:  11/18/2014  FINDINGS: Midline dorsal surgical staple line. Multilevel degenerative change of the visualized lumbar spine. There is grade 1 anterolisthesis of L5 on S1. Multilevel facet degenerative change most pronounced at L3-4, L4-5 and L5-S1. Aortic vascular calcifications. SI joints are unremarkable.  IMPRESSION: Multilevel degenerative change of the lumbar spine.  Grade 1 anterolisthesis L5-S1.   Electronically Signed   By: Annia Belt M.D.   On: 11/26/2014 14:24   Dg Abd 1 View  11/29/2014   CLINICAL DATA:  Follow-up ileus or constipation.  EXAM: ABDOMEN - 1 VIEW  COMPARISON:  November 26, 2014.  FINDINGS: IVC filter is noted. Residual contrast is noted throughout the colon. There is no evidence of bowel dilatation. Moderate residual stool is noted.  IMPRESSION: Moderate stool burden is noted suggesting constipation.   Electronically Signed   By: Roque Lias M.D.   On: 11/29/2014 20:19   Ct Abdomen Pelvis W Contrast  11/26/2014   CLINICAL DATA:  Patient with history of recent back surgery. Right lower quadrant pain.  EXAM: CT ABDOMEN AND PELVIS WITH CONTRAST  TECHNIQUE: Multidetector CT imaging of the abdomen and pelvis was performed using the standard protocol following bolus administration of intravenous contrast.  CONTRAST:  OMNIPAQUE IOHEXOL 300 MG/ML  SOLN  COMPARISON:  11/16/2014  FINDINGS: Lower chest: Small bilateral pleural effusions. Normal heart size. Consolidative opacities within the bilateral lower lobes, suggestive of atelectasis. Additionally there is a central filling defect within a right lower lobe pulmonary artery (image 4; series 201), compatible with pulmonary embolism.  Hepatobiliary: The liver is normal  in size and contour without focal hepatic lesion identified. Mild central intrahepatic biliary ductal dilatation. The common bile duct is appropriate for patient's age measuring up to 8 mm. Gallbladder is unremarkable.  Pancreas: The pancreatic duct measures up to 3 mm. The pancreatic parenchyma is grossly unremarkable.  Spleen: Unremarkable  Adrenals/Urinary Tract: Normal adrenal glands. Kidneys enhance symmetrically with contrast. Exophytic simple cyst off the inferior pole of the left kidney. No hydronephrosis. Urinary bladder is markedly distended.  Stomach/Bowel: Stool present throughout the colon as can be seen with constipation. Sigmoid colonic diverticulosis without evidence for acute diverticulitis. Normal appendix. No evidence for small bowel obstruction. Mild gaseous distended loops of small bowel, potentially representing postoperative ileus.  Vascular/Lymphatic: Scattered calcified atherosclerotic plaque involving the abdominal aorta. No retroperitoneal lymphadenopathy.  Other: Perihepatic ascites.  No free intraperitoneal air.  Anasarca.  Musculoskeletal: Postoperative changes involving the posterior elements of the T11, T12 and L1 vertebral bodies. No significant residual gas demonstrated within the spinal canal at these levels. There is fluid demonstrated within the overlying soft tissues. There  is an overlying surgical staple line. Suggestion of interval decrease in previously described high attenuation acute blood products within the spinal canal.  IMPRESSION: 1. Findings compatible with acute pulmonary embolism within the right lower lobe pulmonary artery. 2. Postoperative changes from the T11 through L1 vertebral bodies. There is associated fluid within the overlying soft tissues, likely postoperative in etiology. Superimposed infection not excluded. 3. Anasarca and small amount of ascites. 4. Marked urinary bladder dilatation. Consider decompression with Foley catheter. 5. Stool throughout the  colon as can be seen with constipation. Possible postoperative ileus. 6. Prominent pancreatic duct measuring up to 3 mm. Mild central intrahepatic biliary ductal dilatation. Recommend correlation with laboratory analysis. Critical Value/emergent results were called by telephone at the time of interpretation on 11/26/2014 at 7:59 pm to Dr. Dutch Quint, who verbally acknowledged these results.   Electronically Signed   By: Annia Belt M.D.   On: 11/26/2014 20:01   Ir Ivc Filter Plmt / S&i /img Guid/mod Sed  11/27/2014   INDICATION: 81 year old female previously supratherapeutic on anticoagulation (History of paroxysmal atrial fibrillation) complicated by subdural spinal hematoma and lower extremity weakness requiring emergent surgical decompression. She has since been doing well until developing right upper quadrant pain yesterday. A CT scan of the abdomen and pelvis demonstrates new acute pulmonary embolus in the right lower lobe pulmonary arteries. The patient is currently not a candidate for anticoagulation given her recent spinal decompression surgery on 11/16/2014. Caval interruption is warranted for PE prophylaxis.  EXAM: ULTRASOUND GUIDANCE FOR VASCULARACCESS  IVC CATHETERIZATION AND VENOGRAM  IVC FILTER INSERTION  COMPARISON:  CT abdomen/pelvis 11/26/2014  MEDICATIONS: Fentanyl 25 mcg IV; Versed 1 mg IV  ANESTHESIA/SEDATION: Sedation Time  Thirteen minutes  CONTRAST:  50mL OMNIPAQUE IOHEXOL 300 MG/ML  SOLN  FLUOROSCOPY TIME:  1 minutes 36 seconds  137.7 mGy  COMPLICATIONS: None immediate  PROCEDURE: Informed consent was obtained from the patient following explanation of the procedure, risks, benefits and alternatives. The patient understands, agrees and consents for the procedure. All questions were addressed. A time out was performed prior to the initiation of the procedure.  Maximal barrier sterile technique utilized including caps, mask, sterile gowns, sterile gloves, large sterile drape, hand hygiene, and  Betadine prep.  Under sterile condition and local anesthesia, right internal jugular venous access was performed with ultrasound. An ultrasound image was saved and sent to PACS. Over a guidewire, the IVC filter delivery sheath and inner dilator were advanced into the IVC just above the IVC bifurcation. Contrast injection was performed for an IVC venogram.  Through the delivery sheath, a retrievable Denali IVC filter was deployed below the level of the renal veins and above the IVC bifurcation. Limited post deployment venacavagram was performed.  The delivery sheath was removed and hemostasis was obtained with manual compression. A dressing was placed. The patient tolerated the procedure well without immediate post procedural complication.  FINDINGS: The IVC is patent. No evidence of thrombus, stenosis, or occlusion. No variant venous anatomy. Successful placement of the IVC filter below the level of the renal veins.  IMPRESSION: Successful ultrasound and fluoroscopically guided placement of an infrarenal retrievable IVC filter via right jugular approach.  PLAN: This IVC filter is potentially retrievable. The patient will be assessed for filter retrieval by Interventional Radiology in approximately 8-12 weeks. Further recommendations regarding filter retrieval, continued surveillance or declaration of device permanence, will be made at that time.  Signed,  Sterling Big, MD  [facility]  [facility] Rad[facility]  Electronically Signed   By: Malachy MoanHeath  McCullough M.D.   On: 11/27/2014 17:33    ASSESSMENT/PLAN:  Paraplegia at T9 level - for rehabilitation; follow-up with neurosurgery - Dr Jeral FruitBotero Neurogenic bladder - continue Flomax 0.4 mg 2 capsules by mouth daily; check post void residual Q 8 hours and catheterize if PVR is>350 cc Orthostatic hypotension - continue Florinef 0.1 mg by mouth daily Paroxysmal atrial flutter - rate controlled; continue digoxin 0.125 mg by mouth  daily and aspirin 81 mg EC daily; follow-up with cardiology - Dr. Patty SermonsBrackbill Hyperlipidemia - continue atorvastatin 10 mg by mouth daily Anxiety - mood is stable; continue Xanax 0.25 mg by mouth daily at bedtime Osteoporosis - continue Fosamax Neurogenic bowel - continue Dulcolax suppository daily    Goals of care:  Short-term rehabilitation    Labs/test ordered:  CMP and CBC   Spent 50 minutes in patient care.   [facility] Center[facility]INA-VARGAS,Joshuah Minella, NP [facility] [phone]

## 2014-12-19 NOTE — Progress Notes (Signed)
Social Work  Discharge Note  The overall goal for the admission was met for:   Discharge location: No- plan changed to SNF as family cannot provide needed assist  Length of Stay: Yes - 18 days  Discharge activity level: Yes - supervision to min assist  Home/community participation: Yes  Services provided included: MD, RD, PT, OT, SLP, RN, TR, Pharmacy, Neuropsych and SW  Financial Services: Other: [employer] Medicare  Follow-up services arranged: Other: SNF at [place] (or additional information):  Patient/Family verbalized understanding of follow-up arrangements: Yes  Individual responsible for coordination of the follow-up plan: patient  Confirmed correct DME delivered: NA    [name] Weber

## 2014-12-20 ENCOUNTER — Non-Acute Institutional Stay (SKILLED_NURSING_FACILITY): Payer: Medicare Other | Admitting: Internal Medicine

## 2014-12-20 DIAGNOSIS — I481 Persistent atrial fibrillation: Secondary | ICD-10-CM | POA: Diagnosis not present

## 2014-12-20 DIAGNOSIS — G839 Paralytic syndrome, unspecified: Secondary | ICD-10-CM

## 2014-12-20 DIAGNOSIS — K592 Neurogenic bowel, not elsewhere classified: Secondary | ICD-10-CM | POA: Diagnosis not present

## 2014-12-20 DIAGNOSIS — N319 Neuromuscular dysfunction of bladder, unspecified: Secondary | ICD-10-CM

## 2014-12-20 DIAGNOSIS — I4819 Other persistent atrial fibrillation: Secondary | ICD-10-CM

## 2014-12-20 DIAGNOSIS — G8220 Paraplegia, unspecified: Secondary | ICD-10-CM

## 2014-12-20 DIAGNOSIS — I2699 Other pulmonary embolism without acute cor pulmonale: Secondary | ICD-10-CM

## 2014-12-20 DIAGNOSIS — E785 Hyperlipidemia, unspecified: Secondary | ICD-10-CM | POA: Diagnosis not present

## 2014-12-20 DIAGNOSIS — M810 Age-related osteoporosis without current pathological fracture: Secondary | ICD-10-CM | POA: Diagnosis not present

## 2014-12-20 DIAGNOSIS — I951 Orthostatic hypotension: Secondary | ICD-10-CM | POA: Diagnosis not present

## 2015-01-02 ENCOUNTER — Non-Acute Institutional Stay (SKILLED_NURSING_FACILITY): Payer: Medicare Other | Admitting: Adult Health

## 2015-01-02 DIAGNOSIS — I951 Orthostatic hypotension: Secondary | ICD-10-CM

## 2015-01-02 DIAGNOSIS — G8220 Paraplegia, unspecified: Secondary | ICD-10-CM

## 2015-01-02 DIAGNOSIS — F411 Generalized anxiety disorder: Secondary | ICD-10-CM

## 2015-01-02 DIAGNOSIS — G839 Paralytic syndrome, unspecified: Secondary | ICD-10-CM

## 2015-01-02 DIAGNOSIS — M810 Age-related osteoporosis without current pathological fracture: Secondary | ICD-10-CM

## 2015-01-02 DIAGNOSIS — I4892 Unspecified atrial flutter: Secondary | ICD-10-CM

## 2015-01-02 DIAGNOSIS — E7800 Pure hypercholesterolemia, unspecified: Secondary | ICD-10-CM

## 2015-01-02 DIAGNOSIS — N319 Neuromuscular dysfunction of bladder, unspecified: Secondary | ICD-10-CM

## 2015-01-02 DIAGNOSIS — K592 Neurogenic bowel, not elsewhere classified: Secondary | ICD-10-CM

## 2015-01-07 ENCOUNTER — Ambulatory Visit: Payer: Medicare Other | Admitting: Cardiology

## 2015-01-08 ENCOUNTER — Other Ambulatory Visit: Payer: Self-pay | Admitting: Cardiology

## 2015-01-09 ENCOUNTER — Encounter: Payer: Self-pay | Admitting: Cardiology

## 2015-01-09 ENCOUNTER — Telehealth: Payer: Self-pay | Admitting: *Deleted

## 2015-01-09 NOTE — Telephone Encounter (Signed)
Pharmacy sent over request for Metoprolol  Discharged from hospital recently and on d/c summary [facility]op[facility] to be [facility] Visit with Facility provider blood pressure 123/74 without Metoprolol Explained to patient that it would not be appropriate to restart medication without follow up office visit with  Dr. Patty SermonsBrackbill prior since secondary to hospital discontinuing medication.  Patient stated she may not be returning to  Dr. Patty SermonsBrackbill so she did not want a return appointment at this time  Will monitor her blood pressure at home

## 2015-01-11 ENCOUNTER — Encounter: Payer: Self-pay | Admitting: Adult Health

## 2015-01-11 NOTE — Progress Notes (Signed)
Patient ID: [name] Weber, female   DOB: [birth year], 81 y.o.   MRN: [id number]   01/02/14  Facility:  Nursing Home Location:  [facility] Room Number: 902-P LEVEL OF CARE:  SNF (31)   Chief Complaint  Patient presents with  . Discharge Note    Paraplegia at T9 level, neurogenic bladder, hypertension, atrial flutter, hyperlipidemia, anxiety, osteoporosis and neurogenic bowel    HISTORY OF PRESENT ILLNESS:  This is an 81 year old female who is for discharge home with Home health PT, OT, Nursing and CNA. DME:  Rolling walker. She has been admitted to [facility] on 12/17/14 from [facility]. She has past medical history of PAF on Coumadin and hyperlipidemia. She was admitted to the hospital with new low back pain, weakness and inability to walk. MRI of spine shows abnormal cord signal due to hemorrhage/hematoma in the ventral spinal canal T10-T12 causing spinal cord compression. Coumadin was reversed and had thoracic laminectomy T11-T12, decompression of epidural space and evacuation of SDH. Postop was complicated by atrial flutter and hypotension. She was found to have RLL PE and DVT in CFV and gastrocnemius and L-DVT in soleus vein. IVC filter placed on 12/29 patient was cleared to start aspirin for stroke prevention per Dr. West Carbo.  She had inpatient rehabilitation for BLE strengthening and coordination.  She has been admitted here at [facility] for continued short-term rehabilitation. Patient was admitted to this facility for short-term rehabilitation after the patient's recent hospitalization.  Patient has completed SNF rehabilitation and therapy has cleared the patient for discharge.    REASSESSMENT OF ONGOING PROBLEMS:  PAST MEDICAL HISTORY:  Past Medical History  Diagnosis Date  . Paroxysmal atrial flutter   . Palpitations     OCCASSIONAL  . PAC (premature atrial contraction)     ISOLATED  . PVC's (premature ventricular  contractions)     ISOLATED  . Malaise   . Fatigue   . Myalgia     CURRENT MEDICATIONS: Reviewed per MAR/see medication list  Allergies  Allergen Reactions  . Crestor [Rosuvastatin Calcium]      REVIEW OF SYSTEMS:  GENERAL: no change in appetite, no fatigue, no weight changes, no fever, chills or weakness RESPIRATORY: no cough, SOB, DOE, wheezing, hemoptysis CARDIAC: no chest pain, edema or palpitations GI: no abdominal pain, diarrhea, constipation, heart burn, nausea or vomiting  PHYSICAL EXAMINATION  GENERAL: no acute distress, normal body habitus NECK: supple, trachea midline, no neck masses, no thyroid tenderness, no thyromegaly LYMPHATICS: no LAN in the neck, no supraclavicular LAN RESPIRATORY: breathing is even & unlabored, BS CTAB CARDIAC: Irregularly irregular, no murmur,no extra heart sounds, no edema GI: abdomen soft, normal BS, no masses, no tenderness, no hepatomegaly, no splenomegaly EXTREMITIES: able to move  X 4 extremities PSYCHIATRIC: the patient is alert & oriented to person, affect & behavior appropriate  LABS/RADIOLOGY: 12/24/13  total protein 5.9 albumin 3.5 total bilirubin 0.5 direct bilirubin 0.1 ALT 58 AST 17  cholesterol 146 HDL 62 LDL 74 triglycerides 50 cholesterol/HDL 2.4 12/19/14  WBC 6.2 hemoglobin 13.9 hematocrit 43.2 MCV 100.7 sodium 139 potassium 4.2 glucose 100 BUN 9 creatinine 0.5 calcium 9.1 total protein 6.4 albumin 2.4 total bilirubin 0.6 ALT 61 AST 16 ALT 12 GFR >60 Labs reviewed: Basic Metabolic Panel:  Recent Labs  95/63/87 0500 12/05/14 0555 12/10/14 1013  NA 135 136 133*  K 3.9 3.8 3.5  CL 100 102 98  CO2 29 27 32  GLUCOSE 96 96 112*  BUN 5* 5* <5*  CREATININE 0.55 0.60 0.65  CALCIUM 8.2* 8.2* 8.3*   Liver Function Tests:  Recent Labs  11/16/14 1000 11/30/14 0713 12/05/14 0555  AST 41* 25 38*  ALT 33 17 30  ALKPHOS 56 66 69  BILITOT 0.6 0.6 0.6  PROT 7.0 5.0* 5.2*  ALBUMIN 3.6 2.2* 2.5*   CBC:  Recent Labs   11/26/14 1015  11/30/14 0713 12/05/14 0555 12/10/14 1013  WBC 5.9  < > 5.5 3.8* 4.9  NEUTROABS 4.3  --  3.8  --  3.4  HGB 11.7*  < > 11.6* 11.9* 12.5  HCT 34.4*  < > 34.4* 35.9* 38.1  MCV 96.6  < > 98.0 98.4 97.7  PLT 193  < > 220 209 217  < > = values in this interval not displayed.  No results found.  ASSESSMENT/PLAN:  Paraplegia at T9 level - for home health OT and nursing and CNA; follow-up with neurosurgery - Dr Jeral FruitBotero Neurogenic bladder - continue Flomax 0.4 mg 2 capsules by mouth daily; check post void residual Q 8 hours and catheterize if PVR is>350 cc Orthostatic hypotension - continue Florinef 0.1 mg by mouth daily Paroxysmal atrial flutter - rate controlled; continue digoxin 0.125 mg by mouth daily and aspirin 81 mg EC daily; follow-up with cardiology - Dr. Patty SermonsBrackbill Hyperlipidemia - continue atorvastatin 10 mg by mouth daily Anxiety - mood is stable; continue Xanax 0.25 mg by mouth daily at bedtime Osteoporosis - continue Fosamax Neurogenic bowel - continue Dulcolax suppository daily   I have filled out patient's discharge paperwork and written prescriptions.  Patient will receive home health PT, OT, Nursing and CNA.  DME provided:  Rolling waler  Total discharge time: Greater than 30 minutes  Discharge time involved coordination of the discharge process with Child psychotherapistsocial worker, nursing staff and therapy department. Medical justification for home health services/DME verified.    [facility] Hospital[facility]-VARGAS,MONINA, NP [facility] [phone]

## 2015-01-16 ENCOUNTER — Other Ambulatory Visit: Payer: Self-pay | Admitting: Adult Health

## 2015-01-16 ENCOUNTER — Telehealth: Payer: Self-pay | Admitting: *Deleted

## 2015-01-16 NOTE — Telephone Encounter (Signed)
[name] HerrlichFaye Weber called for [name] LeschEvelyn Weber stating she was released from extended care and has some questions about her medications.  She said she was on the list of people we can speak to.  She is found under demographics contact list as a friend but her release of information only lists [name] PicklerJoan Weber her sister as someone we may speak with.  Therefore I will contact [name] GraffJoan to see if she knows what the questions are about. I spoke with [name] GraffJoan and she said [name] Eevelyn is able to speak with me on the phone and gave me her number.  [name] Eevelyn thinks it was about her digoxin for her atrial fib. It was prescribed for her by [name] Weber at discharge and she is in the process of changing cardiologist and does not see her pcp until March or April.  She has an appt with [name] Weber on Tuesday 01/22/15 and she says she has enough medication to get her to that appt.  I suggested she speak with him about her med refills during that appt.

## 2015-01-22 ENCOUNTER — Encounter: Payer: Medicare Other | Attending: Physical Medicine & Rehabilitation | Admitting: Physical Medicine & Rehabilitation

## 2015-01-22 ENCOUNTER — Encounter: Payer: Self-pay | Admitting: Physical Medicine & Rehabilitation

## 2015-01-22 ENCOUNTER — Other Ambulatory Visit: Payer: Self-pay | Admitting: Adult Health

## 2015-01-22 VITALS — BP 120/68 | HR 86 | Resp 14

## 2015-01-22 DIAGNOSIS — G839 Paralytic syndrome, unspecified: Secondary | ICD-10-CM | POA: Insufficient documentation

## 2015-01-22 DIAGNOSIS — N319 Neuromuscular dysfunction of bladder, unspecified: Secondary | ICD-10-CM | POA: Diagnosis not present

## 2015-01-22 DIAGNOSIS — S065X0S Traumatic subdural hemorrhage without loss of consciousness, sequela: Secondary | ICD-10-CM | POA: Diagnosis not present

## 2015-01-22 DIAGNOSIS — I119 Hypertensive heart disease without heart failure: Secondary | ICD-10-CM | POA: Diagnosis present

## 2015-01-22 DIAGNOSIS — Z5181 Encounter for therapeutic drug level monitoring: Secondary | ICD-10-CM | POA: Diagnosis not present

## 2015-01-22 DIAGNOSIS — Z79899 Other long term (current) drug therapy: Secondary | ICD-10-CM | POA: Insufficient documentation

## 2015-01-22 DIAGNOSIS — G8220 Paraplegia, unspecified: Secondary | ICD-10-CM

## 2015-01-22 NOTE — Patient Instructions (Signed)
PLEASE CALL ME WITH ANY PROBLEMS OR QUESTIONS (#[phone]).      

## 2015-01-22 NOTE — Progress Notes (Signed)
Subjective:    Patient ID: [name] Weber, female    DOB: 04/05/1931, 81 y.o.   MRN: [id number]  HPI   [name] Weber is back regarding her T10-12 myelopathy. She has been walking around her home, using a walker. She is doing some stairs and light house chairs, cooking. She denies any falls. She hasn't had any near misses.   Bed mobility sometimes is a little difficult. She denies pain.   Bowels are fairly regular. Her bladder is very frequent, especially at night for small amounts. She is drinking fairly regularly. She is not using the levsin and rarely uses the baclofen. She remains on flomax regularly. She is off xanax also. She is not cathing herself. She denies fever, pain, pyuria.    From a therapy standpoint, she starts outpt therapies tomorrow.  She is being sent to outpt ortho rehab, however.      Pain Inventory Average Pain 0 Pain Right Now 0 My pain is no pain  In the last 24 hours, has pain interfered with the following? General activity no pain Relation with others no pain Enjoyment of life no pain What TIME of day is your pain at its worst? no pain Sleep (in general) Fair  Pain is worse with: no pain Pain improves with: no pain Relief from Meds: no pain  Mobility walk with assistance use a walker how many minutes can you walk? 5-10 ability to climb steps?  yes do you drive?  no  Function retired  Neuro/Psych bladder control problems  Prior Studies Hospital f/u  Physicians involved in your care Hospital f/u   Family History  Problem Relation Age of Onset  . Hyperlipidemia Mother    History   Social History  . Marital Status: Divorced    Spouse Name: N/A  . Number of Children: N/A  . Years of Education: N/A   Social History Main Topics  . Smoking status: Former Smoker    Quit date: 03/09/1991  . Smokeless tobacco: Not on file  . Alcohol Use: No  . Drug Use: No  . Sexual Activity: Not on file   Other Topics Concern  . None    Social History Narrative   Past Surgical History  Procedure Laterality Date  . Vulvar lesion removal  01/01/2009    She had a 1-cm area of erythema to the right of the urethral meatus  . Thoracic laminectomy for epidural abscess N/A 11/16/2014    Procedure: THORACIC LAMINECTOMY FOR EPIDURAL ABSCESS;  Surgeon: Karn CassisErnesto M Botero, MD;  Location: [facility];  Service: Neurosurgery;  Laterality: N/A;   Past Medical History  Diagnosis Date  . Paroxysmal atrial flutter   . Palpitations     OCCASSIONAL  . PAC (premature atrial contraction)     ISOLATED  . PVC's (premature ventricular contractions)     ISOLATED  . Malaise   . Fatigue   . Myalgia    BP 120/68 mmHg  Pulse 86  Resp 14  SpO2 98%  Opioid Risk Score:   Fall Risk Score:    Review of Systems  Constitutional:       Weight loss  Genitourinary: Positive for frequency.  All other systems reviewed and are negative.      Objective:   Physical Exam  Constitutional: She is oriented to person, place, and time. She appears well-developed.  HENT: oral mucosa moist, dentition good. HOH Head: Normocephalic and atraumatic.  Eyes: Conjunctivae are normal. Pupils are equal, round, and reactive to  light.  Neck: Normal range of motion. Neck supple.  Cardiovascular: Normal rate.  Respiratory: Effort normal and breath sounds normal. No respiratory distress. She has no wheezes.  GI: Soft. Bowel sounds are normal. She exhibits no distension.  Abdomen Non-tender.  Musculoskeletal: She exhibits no edema or tenderness. l Neurological: She is alert and oriented to person, place, and time.   Speech clear. Follows commands without difficulty. impaired proprioception in both legs/LT improved in both legs. Strength: 4 to 4+/5HF,KE bilaterally and 4+ADF/APF. UE's 5/5.  Skin: Skin is warm and dry. Back incision clean,dry and intact.  Psych: pleasant. Sl anxious  Assessment/Plan: 1. Functional deficits secondary to T 10-12  hematoma with myelopathy and paraplegia  -made referral to neuro rehab to advance gait 2. Neurogenic bowel:  -diet regulated 3. Neurogenic bladder:  -urecholine is not an option given her afib  -continue flomax  -urology referral  -UA and culture are recommended 4. Afib: refilled digoxin today.  -needs to see pcp or cards for further refills and adjustments  Ranelle Oyster, MD, [facility] Physical Medicine & Rehabilitation 01/22/2015

## 2015-01-23 ENCOUNTER — Ambulatory Visit: Payer: Medicare Other | Attending: Physical Medicine & Rehabilitation | Admitting: Physical Therapy

## 2015-01-23 ENCOUNTER — Ambulatory Visit: Payer: Medicare Other | Admitting: Physical Therapy

## 2015-01-23 DIAGNOSIS — R6889 Other general symptoms and signs: Secondary | ICD-10-CM | POA: Insufficient documentation

## 2015-01-23 DIAGNOSIS — M6281 Muscle weakness (generalized): Secondary | ICD-10-CM | POA: Diagnosis not present

## 2015-01-23 DIAGNOSIS — R269 Unspecified abnormalities of gait and mobility: Secondary | ICD-10-CM

## 2015-01-23 NOTE — Therapy (Signed)
[facility] [address] [place], [place], [address] Phone: [phone]   Fax:  [phone]  Physical Therapy Evaluation  Patient Details  Name: [name] Weber MRN: [id number] Date of Birth: 01-06-1931 Referring Provider:  Ranelle Oyster, MD  Encounter Date: 01/23/2015      PT End of Session - 01/23/15 1719    Visit Number 1   Number of Visits 25   Date for PT Re-Evaluation 03/24/15   Authorization Type Medicare G-code every 10th visit   PT Start Time 0803   PT Stop Time 0847   PT Time Calculation (min) 44 min   Equipment Utilized During Treatment Gait belt   Activity Tolerance Patient tolerated treatment well   Behavior During Therapy [facility] for tasks assessed/performed      Past Medical History  Diagnosis Date  . Paroxysmal atrial flutter   . Palpitations     OCCASSIONAL  . PAC (premature atrial contraction)     ISOLATED  . PVC's (premature ventricular contractions)     ISOLATED  . Malaise   . Fatigue   . Myalgia     Past Surgical History  Procedure Laterality Date  . Vulvar lesion removal  01/01/2009    She had a 1-cm area of erythema to the right of the urethral meatus  . Thoracic laminectomy for epidural abscess N/A 11/16/2014    Procedure: THORACIC LAMINECTOMY FOR EPIDURAL ABSCESS;  Surgeon: Karn Cassis, MD;  Location: [facility];  Service: Neurosurgery;  Laterality: N/A;    There were no vitals taken for this visit.  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  Pt is experiencing R side weaker than L.  Pt was hospitalized for several weeks, followed by rehab at [facility].   Pt is using rolling walker, but at home  walking short distances without device.  Prior to hospitalization, pt was healthy and independent.   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   Currently in Pain? No/denies          [facility] PT Assessment - 01/23/15 0811    Assessment   Medical Diagnosis paraplegia T9; traumatic spinal subdural hematoma   Onset Date 11/16/14   Precautions   Precautions Fall;Back  No driving; avoid twisting, bending beyond comfort level   Balance Screen   Has the patient fallen in the past 6 months No   Has the patient had a decrease in activity level because of a fear of falling?  No   Is the patient reluctant to leave their home because of a fear of falling?  No   Home Environment   Living Enviornment Private residence   Living Arrangements Alone   Available Help at Discharge Neighbor;Friend(s)   Type of Home House   Home Access Stairs to enter   Entrance Stairs-Number of Steps 7  then additional 7 at front; 7 from garage   Entrance Stairs-Rails Right   Home Layout One level   Home Equipment Walker - 2 wheels;Shower seat;Hand held shower head   Prior Function   Level of Independence Independent  with basic ADLs;Independent with homemaking with ambulation;Independent with gait;Independent with transfers   Leisure walked dog several times a day   Observation/Other Assessments   Focus on Therapeutic Outcomes (FOTO)  Functional status intake score 45   ROM / Strength   AROM / PROM / Strength Strength   Strength   Strength Assessment Site Knee;Hip;Ankle   Right/Left Hip Right;Left   Right Hip Flexion 3/5   Right Hip ABduction 3+/5   Left Hip Flexion 3+/5   Left Hip ABduction 3+/5   Right/Left Knee Right;Left   Right Knee Flexion 3+/5   Right Knee Extension 3+/5   Left Knee Flexion 4/5   Left Knee Extension 4/5   Right/Left Ankle Right;Left   Right Ankle Dorsiflexion 3/5   Left  Ankle Dorsiflexion 3/5   Transfers   Transfers Sit to Stand;Stand to Sit   Sit to Stand 6: Modified independent (Device/Increase time);With upper extremity assist;From chair/3-in-1  Attempted no UE support; pt unable   Stand to Sit 6: Modified independent (Device/Increase time)   Ambulation/Gait   Ambulation/Gait Yes   Ambulation/Gait Assistance 5: Supervision  with RW; min guard with no device   Ambulation/Gait Assistance Details Performed TUG and gait velocity with RW and with no device   Ambulation Distance (Feet) 60 Feet  x 2 reps   Assistive device Rolling walker  then no device   Gait Pattern Decreased step length - right;Decreased step length - left;Wide base of support;Decreased dorsiflexion - left;Decreased dorsiflexion - right  decreased arm swing, decr. trunk rotation   Ambulation Surface Level;Indoor   Gait velocity 10.31 sec (3.18 ft/sec) with RW; 10.94 sec (2.99 ft/sec) with no device   Stairs No  Pt reports negotiating steps with step-to pattern and rails   Standardized Balance Assessment   Standardized Balance Assessment Timed Up and Go Test;Berg Balance Test   Berg Balance Test   Sit to Stand Able to stand  independently using hands   Standing Unsupported Able to stand 2 minutes with supervision   Sitting with Back Unsupported but Feet Supported on Floor or Stool Able to sit safely and securely 2 minutes   Stand to Sit Controls descent by using hands   Transfers Able to transfer safely, definite need of hands   Standing Unsupported with Eyes Closed Able to stand 10 seconds with supervision  increased trunk sway   Standing Ubsupported with Feet Together Able to place feet together independently and stand for 1 minute with supervision  increased trunk sway   From Standing, Reach Forward with Outstretched Arm Reaches forward but needs supervision  8 in   From Standing Position, Pick up Object from Floor Unable to try/needs assist to keep balance   From Standing  Position, Turn to Look Behind Over each Shoulder Needs supervision when turning   Turn 360 Degrees Needs close supervision or verbal cueing   Standing Unsupported, Alternately Place Feet on Step/Stool Needs assistance to keep from falling or unable to try   Standing Unsupported, One Foot in Front Needs help to step but can hold 15 seconds   Standing on One Leg Unable to try or needs assist to prevent fall   Total Score 26   Timed Up and Go Test   Normal TUG (seconds) 18.48  with RW, 15.89 sec no device, wider BOS                          PT Education - 01/23/15 1717  Education provided Yes   Education Details Initiated core stability exericses:  in sitting abdominal activation, then keeping trunk steady while performing seated march, and seated LAQ   Person(s) Educated Patient;Other (comment)  neighbor   Methods Explanation;Demonstration   Comprehension Verbalized understanding;Returned demonstration          PT Short Term Goals - 01/23/15 1724    PT SHORT TERM GOAL #1   Title Pt will be independent with HEP for improved strength, balance, and gait.   Time 4   Period Weeks   Status New   PT SHORT TERM GOAL #2   Title Pt will improve  TUG score to less than or equal to 15 seconds for decreased fall risk.   Time 4   Period Weeks   Status New   PT SHORT TERM GOAL #3   Title Pt will improve Berg Balance score to at least 31/56 for decreased fall risk.   Time 4   Period Weeks   Status New   PT SHORT TERM GOAL #4   Title Pt will perform at least 8 of 10 reps of sit<>stand transfers with minimal UE support, to demonstrate improved lower extremity strength.   Time 4   Period Weeks   Status New           PT Long Term Goals - 01/23/15 1726    PT LONG TERM GOAL #1   Title Pt will verbalize understanding of fall prevention techniques within the home environment.   Time 8   Period Weeks   Status New   PT LONG TERM GOAL #2   Title Pt will improve Timed  UP and Go score to less than or equal to 13.5 seconds for decreased fall risk.   Time 8   Period Weeks   Status New   PT LONG TERM GOAL #3   Title Pt will improve Berg Balance score to at least 41/56 for decreased fall risk.   Time 8   Period Weeks   Status New   PT LONG TERM GOAL #4   Title Pt will ambulate at least 200 ft. using cane or no device, modified independently, for improved functional mobility.   Time 8   Period Weeks   Status New   PT LONG TERM GOAL #5   Title Pt will verbalize understanding of transition to community fitness upon D/C from PT.   Time 8   Period Weeks   Status New               Plan - 01/23/15 1720    Clinical Impression Statement Pt is an 81 year old female who presents to OP PT status post traumatic spinal subdural hematoma with paraplegia at T9.  Pt presents to OP PT with decreased trunk and lower extremity strength, decreased balance and decreased gait independence.  Pt is at risk for falls per Timed Up and Go score of 18.48 seconds and Berg score of 26/56.  Prior to hospitalization, pt was fully independent, living alone, walking dog several times per day..  Pt would benefit from skilled PT to improve funcitonal mobility and decrease fall risk.   Pt will benefit from skilled therapeutic intervention in order to improve on the following deficits Abnormal gait;Decreased balance;Decreased coordination;Decreased mobility;Decreased strength;Difficulty walking   Rehab Potential Good   PT Frequency 3x / week   PT Duration 8 weeks  plus evaluation   PT Treatment/Interventions ADLs/Self Care Home Management;Functional mobility training;Stair training;Gait training;DME Instruction;Therapeutic activities;Therapeutic exercise;Balance training;Neuromuscular  re-education;Patient/family education          G-Codes - 02-06-2015 1729    Functional Assessment Tool Used TUG 18.48 sec with RW; Sharlene Motts 26/56   Functional Limitation Mobility: Walking and moving  around   Mobility: Walking and Moving Around Current Status [phone]) At least 40 percent but less than 60 percent impaired, limited or restricted   Mobility: Walking and Moving Around Goal Status [phone]) At least 1 percent but less than 20 percent impaired, limited or restricted       Problem List Patient Active Problem List   Diagnosis Date Noted  . Anxiety state 12/10/2014  . Neurogenic bladder 11/30/2014  . Neurogenic bowel 11/30/2014  . Bacterial UTI 11/30/2014  . Thoracic myelopathy 11/29/2014  . Persistent atrial fibrillation 11/27/2014  . Traumatic spinal subdural hematoma   . Acute pulmonary embolism   . Orthostatic hypotension 11/22/2014  . Near syncope 11/21/2014  . Paraplegia at T9 level 11/17/2014  . Weakness of both legs   . Nocturnal leg cramps 07/16/2014  . Encounter for therapeutic drug monitoring 12/25/2013  . Malaise and fatigue 06/01/2011  . Atrial flutter 03/10/2011  . Encounter for long-term (current) use of anticoagulants 03/10/2011  . Benign hypertensive heart disease without heart failure 03/10/2011  . Hypercholesterolemia 03/10/2011  . Paroxysmal atrial flutter   . Palpitations   . PAC (premature atrial contraction)   . PVC's (premature ventricular contractions)   . Malaise   . Fatigue   . Myalgia     Yer Olivencia W. 02/06/2015, 5:31 PM Lonia Blood, PT 02-06-15 5:31 PM Phone: [phone] Fax: [phone]  [facility] [address] [place], [place], [address] Phone: [phone]   Fax:  [phone]

## 2015-01-23 NOTE — Addendum Note (Signed)
Addended by: Angela NevinWESSLING, Kewanna Kasprzak D on: 01/23/2015 10:03 AM   Modules accepted: Orders

## 2015-01-23 NOTE — Addendum Note (Signed)
Addended by: Angela NevinWESSLING, Rakayla Ricklefs D on: 01/23/2015 09:41 AM   Modules accepted: Orders

## 2015-01-24 ENCOUNTER — Ambulatory Visit: Payer: Medicare Other | Admitting: Physical Therapy

## 2015-01-24 ENCOUNTER — Encounter: Payer: Self-pay | Admitting: Physical Therapy

## 2015-01-24 ENCOUNTER — Other Ambulatory Visit: Payer: Self-pay | Admitting: Adult Health

## 2015-01-24 DIAGNOSIS — M6281 Muscle weakness (generalized): Secondary | ICD-10-CM

## 2015-01-24 DIAGNOSIS — R269 Unspecified abnormalities of gait and mobility: Secondary | ICD-10-CM | POA: Diagnosis not present

## 2015-01-24 DIAGNOSIS — R6889 Other general symptoms and signs: Secondary | ICD-10-CM

## 2015-01-24 LAB — URINALYSIS
Bilirubin Urine: NEGATIVE
Glucose, UA: NEGATIVE mg/dL
Hgb urine dipstick: NEGATIVE
Ketones, ur: NEGATIVE mg/dL
Nitrite: NEGATIVE
Protein, ur: NEGATIVE mg/dL
Specific Gravity, Urine: 1.023 (ref 1.005–1.030)
Urobilinogen, UA: 0.2 mg/dL (ref 0.0–1.0)
pH: 5 (ref 5.0–8.0)

## 2015-01-24 NOTE — Therapy (Signed)
[facility] [address] [place], [place], [address] Phone: [phone]   Fax:  [phone]  Physical Therapy Treatment  Patient Details  Name: [name] Weber MRN: [id number] Date of Birth: 07/23/1931 Referring Provider:  Eartha InchBadger, Michael C, MD  Encounter Date: 01/24/2015      PT End of Session - 01/24/15 1458    Visit Number 2   Number of Visits 25   Date for PT Re-Evaluation 03/24/15   Authorization Type Medicare G-code every 10th visit   PT Start Time 1315   PT Stop Time 1357   PT Time Calculation (min) 42 min   Equipment Utilized During Treatment Gait belt   Activity Tolerance Patient tolerated treatment well   Behavior [facility] for tasks assessed/performed      Past Medical History  Diagnosis Date  . Paroxysmal atrial flutter   . Palpitations     OCCASSIONAL  . PAC (premature atrial contraction)     ISOLATED  . PVC's (premature ventricular contractions)     ISOLATED  . Malaise   . Fatigue   . Myalgia     Past Surgical History  Procedure Laterality Date  . Vulvar lesion removal  01/01/2009    She had a 1-cm area of erythema to the right of the urethral meatus  . Thoracic laminectomy for epidural abscess N/A 11/16/2014    Procedure: THORACIC LAMINECTOMY FOR EPIDURAL ABSCESS;  Surgeon: Karn CassisErnesto M Botero, MD;  Location: [facility];  Service: Neurosurgery;  Laterality: N/A;    There were no vitals taken for this visit.  Visit Diagnosis:  Muscle weakness of lower extremity  Postural instability of trunk      Subjective Assessment - 01/24/15 1323    Symptoms No new complaints. No falls to report or pain today.   Currently in Pain? No/denies   Pain Score 0-No pain     Treatment: Exercise - bridge 5 sec hold x 10 reps - straight leg raise 5 sec hold x 10 reps each leg - lying at edge of mat with knees bent: lifting bent leg off mat and then back onto mat x 10 reps each leg -  sit/stands with no UE assist x 10 reps  Neuro Re-ed: At counter: limited UE assist with each activity below. Cues on posture/ex form. - side stepping x 3 laps each way - heel/toe walk forward and backwards x 3 laps each both ways - tandem gait forward/backward x 3 laps each way - [place] x 3 laps each way         PT Education - 01/24/15 1354    Education provided Yes   Education Details HEP for leg strength and balance   Person(s) Educated Patient   Methods Explanation;Demonstration   Comprehension Verbalized understanding;Returned demonstration          PT Short Term Goals - 01/23/15 1724    PT SHORT TERM GOAL #1   Title Pt will be independent with HEP for improved strength, balance, and gait.   Time 4   Period Weeks   Status New   PT SHORT TERM GOAL #2   Title Pt will improve  TUG score to less than or equal to 15 seconds for decreased fall risk.   Time 4   Period Weeks   Status New   PT SHORT TERM GOAL #3   Title Pt will improve Berg Balance score to at least 31/56 for decreased fall risk.   Time 4  Period Weeks   Status New   PT SHORT TERM GOAL #4   Title Pt will perform at least 8 of 10 reps of sit<>stand transfers with minimal UE support, to demonstrate improved lower extremity strength.   Time 4   Period Weeks   Status New           PT Long Term Goals - 01/23/15 1726    PT LONG TERM GOAL #1   Title Pt will verbalize understanding of fall prevention techniques within the home environment.   Time 8   Period Weeks   Status New   PT LONG TERM GOAL #2   Title Pt will improve Timed UP and Go score to less than or equal to 13.5 seconds for decreased fall risk.   Time 8   Period Weeks   Status New   PT LONG TERM GOAL #3   Title Pt will improve Berg Balance score to at least 41/56 for decreased fall risk.   Time 8   Period Weeks   Status New   PT LONG TERM GOAL #4   Title Pt will ambulate at least 200 ft. using cane or no device, modified  independently, for improved functional mobility.   Time 8   Period Weeks   Status New   PT LONG TERM GOAL #5   Title Pt will verbalize understanding of transition to community fitness upon D/C from PT.   Time 8   Period Weeks   Status New          Plan - 01/24/15 1459    Clinical Impression Statement Educated pt on and issued HEP today for lower extremity strengthening and balance. no issues reported with performing them today. Pt progressing toward STG's.   Pt will benefit from skilled therapeutic intervention in order to improve on the following deficits Abnormal gait;Decreased balance;Decreased coordination;Decreased mobility;Decreased strength;Difficulty walking   Rehab Potential Good   PT Frequency 3x / week   PT Duration 8 weeks  plus evaluation   PT Treatment/Interventions ADLs/Self Care Home Management;Functional mobility training;Stair training;Gait training;DME Instruction;Therapeutic activities;Therapeutic exercise;Balance training;Neuromuscular re-education;Patient/family education   PT Next Visit Plan Continue with strengtheining, balance and gait toward STG's.       Problem List Patient Active Problem List   Diagnosis Date Noted  . Anxiety state 12/10/2014  . Neurogenic bladder 11/30/2014  . Neurogenic bowel 11/30/2014  . Bacterial UTI 11/30/2014  . Thoracic myelopathy 11/29/2014  . Persistent atrial fibrillation 11/27/2014  . Traumatic spinal subdural hematoma   . Acute pulmonary embolism   . Orthostatic hypotension 11/22/2014  . Near syncope 11/21/2014  . Paraplegia at T9 level 11/17/2014  . Weakness of both legs   . Nocturnal leg cramps 07/16/2014  . Encounter for therapeutic drug monitoring 12/25/2013  . Malaise and fatigue 06/01/2011  . Atrial flutter 03/10/2011  . Encounter for long-term (current) use of anticoagulants 03/10/2011  . Benign hypertensive heart disease without heart failure 03/10/2011  . Hypercholesterolemia 03/10/2011  . Paroxysmal  atrial flutter   . Palpitations   . PAC (premature atrial contraction)   . PVC's (premature ventricular contractions)   . Malaise   . Fatigue   . Myalgia     Sallyanne Kuster 01/24/2015, 3:07 PM  Sallyanne Kuster, PTA, [facility] [address] [place], [place] [address] [phone] 01/24/2015, 3:07 PM

## 2015-01-24 NOTE — Patient Instructions (Signed)
Bridging   Slowly raise buttocks from floor, keeping stomach tight. Hold for 5 seconds. Repeat _10___ times.  Do _1-2___ sessions per day.  http://orth.exer.us/1096   Copyright  VHI. All rights reserved.  Straight Leg Raise   Tighten stomach and slowly raise locked right leg _2-3___ inches from floor. Hold for 5 seconds and then slowly lower leg back down. Repeat with other leg. Repeat _10___ times per set. Do _1-2___ sessions per day.  http://orth.exer.us/1102   Copyright  VHI. All rights reserved.  Strengthening: Hip Abductor - Resisted  With band looped around both legs above knees, push thighs apart. Hold for 5 seconds. Repeat __10__ times per set.  Do _1-2_ sessions per day.  http://orth.exer.us/688   Copyright  VHI. All rights reserved.  Hip Flexor Stretch   Lying on back near edge of bed, with legs in bent position. Lower leg closest to edge of bed down, off edge of bed to where the foot in on the floor next to the bed. Then lift that leg back up onto the bed. 10 times. Repeat with other leg. 1-2 times a day.   http://gt2.exer.us/347   Copyright  VHI. All rights reserved.  Functional Quadriceps: Sit to Stand   Sit on edge of chair, feet flat on floor. Stand upright, extending knees fully. Repeat ___10_ times per set. Do _1-2___ sessions per day.  http://orth.exer.us/735   Copyright  VHI. All rights reserved.  Side-Stepping  At counter top: walk to one side with support as needed on counter. Then walk sideways back the other direction. Keep feet pointed at counter top (do not let them rotate out) Repeat for 3 laps each way. Do _1-2___ sessions per day.  Copyright  VHI. All rights reserved.  Feet Heel-Toe "Tandem"   At counter top: Arms by your side or on counter, walk a straight line bringing one foot directly in front of the other. Then walk a straight line backwards, one foot behind the other one. Repeat for 3 laps each way. Do _1-2__ sessions per  day.  Copyright  VHI. All rights reserved.  Walking on Heels   At counter: Walk on heels forward and then backwards. Repeat 3 laps each way. Do _1-2__ sessions per day.  Copyright  VHI. All rights reserved.  Walking on Toes   At counter: Walk on toes forward and then backward for 3 laps each way. Do _1-2___ sessions per day.  Copyright  VHI. All rights reserved.  Braiding   Move to side: 1) cross right leg in front of left, 2) bring back leg out to side, then 3) cross right leg behind left, 4) bring left leg out to side. Continue sequence in same direction. Reverse sequence, moving in opposite direction. (GRAPEVINE)  Repeat sequence 3 laps each way. Do 1-2__ sessions per day.   Copyright  VHI. All rights reserved.

## 2015-01-25 ENCOUNTER — Telehealth: Payer: Self-pay | Admitting: Radiology

## 2015-01-25 NOTE — Telephone Encounter (Signed)
Call for status update re:  IVC filter & plans for anticoagulation Rx.   Left message on home #.  Azana Kiesler Carmell AustriaGales, RN 01/25/2015 1:58 PM

## 2015-01-29 ENCOUNTER — Ambulatory Visit: Payer: Medicare Other | Attending: Physical Medicine & Rehabilitation | Admitting: Physical Therapy

## 2015-01-29 ENCOUNTER — Telehealth: Payer: Self-pay | Admitting: *Deleted

## 2015-01-29 ENCOUNTER — Encounter: Payer: Self-pay | Admitting: Physical Therapy

## 2015-01-29 DIAGNOSIS — R269 Unspecified abnormalities of gait and mobility: Secondary | ICD-10-CM | POA: Diagnosis present

## 2015-01-29 DIAGNOSIS — R6889 Other general symptoms and signs: Secondary | ICD-10-CM | POA: Diagnosis not present

## 2015-01-29 DIAGNOSIS — M6281 Muscle weakness (generalized): Secondary | ICD-10-CM | POA: Diagnosis not present

## 2015-01-29 NOTE — Telephone Encounter (Signed)
[facility] needs to contact the patient and let us know the culture was not completed, nor did they contact us about an unusable specimen. Please give the patient our apologies. That is embarrassing.

## 2015-01-29 NOTE — Telephone Encounter (Signed)
Calling to check on urine specimen results from visit 01/22/15.  The results is there for the UA but there is not result for culture.  I see the order but [facility] does not have and the specimen is not usable to do the cx. (their response is "it is possible the order did nto cross over into their system" ??)

## 2015-01-29 NOTE — Therapy (Signed)
[facility] [address] [place], [place], [address] Phone: [phone]   Fax:  [phone]  Physical Therapy Treatment  Patient Details  Name: [name] Weber MRN: [id number] Date of Birth: 05/08/1931 Referring Provider:  Eartha InchBadger, Michael C, MD  Encounter Date: 01/29/2015      PT End of Session - 01/29/15 1021    Visit Number 3   Number of Visits 25   Date for PT Re-Evaluation 03/24/15   Authorization Type Medicare G-code every 10th visit   PT Start Time 1016   PT Stop Time 1105   PT Time Calculation (min) 49 min   Equipment Utilized During Treatment Gait belt   Activity Tolerance Patient tolerated treatment well   Behavior During Therapy [facility] for tasks assessed/performed      Past Medical History  Diagnosis Date  . Paroxysmal atrial flutter   . Palpitations     OCCASSIONAL  . PAC (premature atrial contraction)     ISOLATED  . PVC's (premature ventricular contractions)     ISOLATED  . Malaise   . Fatigue   . Myalgia     Past Surgical History  Procedure Laterality Date  . Vulvar lesion removal  01/01/2009    She had a 1-cm area of erythema to the right of the urethral meatus  . Thoracic laminectomy for epidural abscess N/A 11/16/2014    Procedure: THORACIC LAMINECTOMY FOR EPIDURAL ABSCESS;  Surgeon: Karn CassisErnesto M Botero, MD;  Location: [facility];  Service: Neurosurgery;  Laterality: N/A;    There were no vitals taken for this visit.  Visit Diagnosis:  Muscle weakness of lower extremity  Postural instability of trunk      Subjective Assessment - 01/29/15 1020    Symptoms No new complaints. No pain to report or falls. Reports still challenged by the balance parts of the HEP.   Pertinent History paragplegia T9 following spinal cord CVA 11/16/14   Currently in Pain? No/denies   Pain Score 0-No pain     Treatment: Exercise On mat with red ball - bridge 5 sec x 10 reps - bridge with hamstring  curls x 10 reps - attempted pball pass via V curl, pt unable to coordinate and lift legs while holding ball between them - ball dribble while in table top position x 10 reps with rest midway  Quadruped - alternating leg extension x 10 each side - combo contralateral arm/leg raises x 10 each side with assist for balance/stability and cues on ex form  Seated on pball: Emphasis on tall posture and abdominal bracing - bouncing x 1 minute - alternating long arc quads x 10 each side - alternating marches x 10 each side - combo contralateral sides arm/leg raises x 10 each side  Neuro Re-ed: Single leg stance activities with tall cones - alternating forward toe taps, cross toe taps, forward double toe taps, cross double taps and flip over/up x 10 each with up to mod assist for balance without UE support. Cues on posture and weight shift and stability before lifting leg. Cues to slow down as well.   Zoom ball Standing on 1 inch foam x 1-2 minutes with up to min assist and cues on posture and abdominal bracing for postural support/stability  Standing on air ex x 1-2 minutes with up to min assist. Cues as above  With feet across blue foam beam: alternating forward heel taps and alternating backward toe taps x 10 reps each foot with up to mod  assist and cues for weight shifting, foot position on beam and to slow down for increased stability.          PT Short Term Goals - 01/23/15 1724    PT SHORT TERM GOAL #1   Title Pt will be independent with HEP for improved strength, balance, and gait.   Time 4   Period Weeks   Status New   PT SHORT TERM GOAL #2   Title Pt will improve  TUG score to less than or equal to 15 seconds for decreased fall risk.   Time 4   Period Weeks   Status New   PT SHORT TERM GOAL #3   Title Pt will improve Berg Balance score to at least 31/56 for decreased fall risk.   Time 4   Period Weeks   Status New   PT SHORT TERM GOAL #4   Title Pt will perform at least 8  of 10 reps of sit<>stand transfers with minimal UE support, to demonstrate improved lower extremity strength.   Time 4   Period Weeks   Status New           PT Long Term Goals - 01/23/15 1726    PT LONG TERM GOAL #1   Title Pt will verbalize understanding of fall prevention techniques within the home environment.   Time 8   Period Weeks   Status New   PT LONG TERM GOAL #2   Title Pt will improve Timed UP and Go score to less than or equal to 13.5 seconds for decreased fall risk.   Time 8   Period Weeks   Status New   PT LONG TERM GOAL #3   Title Pt will improve Berg Balance score to at least 41/56 for decreased fall risk.   Time 8   Period Weeks   Status New   PT LONG TERM GOAL #4   Title Pt will ambulate at least 200 ft. using cane or no device, modified independently, for improved functional mobility.   Time 8   Period Weeks   Status New   PT LONG TERM GOAL #5   Title Pt will verbalize understanding of transition to community fitness upon D/C from PT.   Time 8   Period Weeks   Status New           Plan - 01/29/15 1021    Clinical Impression Statement Continued to focus on strengthening and balance today without any issues reported. Pt progressing well towards goals.   Pt will benefit from skilled therapeutic intervention in order to improve on the following deficits Abnormal gait;Decreased balance;Decreased coordination;Decreased mobility;Decreased strength;Difficulty walking   Rehab Potential Good   PT Frequency 3x / week   PT Duration 8 weeks  plus evaluation   PT Treatment/Interventions ADLs/Self Care Home Management;Functional mobility training;Stair training;Gait training;DME Instruction;Therapeutic activities;Therapeutic exercise;Balance training;Neuromuscular re-education;Patient/family education   PT Next Visit Plan Continue with strengtheining, balance and gait toward STG's.        Problem List Patient Active Problem List   Diagnosis Date Noted  .  Anxiety state 12/10/2014  . Neurogenic bladder 11/30/2014  . Neurogenic bowel 11/30/2014  . Bacterial UTI 11/30/2014  . Thoracic myelopathy 11/29/2014  . Persistent atrial fibrillation 11/27/2014  . Traumatic spinal subdural hematoma   . Acute pulmonary embolism   . Orthostatic hypotension 11/22/2014  . Near syncope 11/21/2014  . Paraplegia at T9 level 11/17/2014  . Weakness of both legs   . Nocturnal leg cramps  07/16/2014  . Encounter for therapeutic drug monitoring 12/25/2013  . Malaise and fatigue 06/01/2011  . Atrial flutter 03/10/2011  . Encounter for long-term (current) use of anticoagulants 03/10/2011  . Benign hypertensive heart disease without heart failure 03/10/2011  . Hypercholesterolemia 03/10/2011  . Paroxysmal atrial flutter   . Palpitations   . PAC (premature atrial contraction)   . PVC's (premature ventricular contractions)   . Malaise   . Fatigue   . Myalgia     Sallyanne Kuster 01/29/2015, 12:06 PM  Sallyanne Kuster, PTA, [facility] [address], [address] [place], [place] [address] [phone] 01/29/2015, 12:06 PM

## 2015-01-30 NOTE — Telephone Encounter (Signed)
I notified Mrs [name] Weber.  I recommended she either go by her PCP office to do a urine test or she could come by here.  She is not having symptoms of UTI, just getting up about 4 times during night.  During the day she is not troubled with this.  She may come by to do a repeat next week--but  may not at this point.

## 2015-02-01 ENCOUNTER — Ambulatory Visit: Payer: Medicare Other | Admitting: Physical Therapy

## 2015-02-01 ENCOUNTER — Encounter: Payer: Self-pay | Admitting: Physical Therapy

## 2015-02-01 ENCOUNTER — Other Ambulatory Visit: Payer: Self-pay | Admitting: *Deleted

## 2015-02-01 DIAGNOSIS — R6889 Other general symptoms and signs: Secondary | ICD-10-CM

## 2015-02-01 DIAGNOSIS — R269 Unspecified abnormalities of gait and mobility: Secondary | ICD-10-CM

## 2015-02-01 DIAGNOSIS — M6281 Muscle weakness (generalized): Secondary | ICD-10-CM

## 2015-02-01 MED ORDER — DIGOXIN 125 MCG PO TABS: 0.1250 mg | ORAL_TABLET | Freq: Every day | ORAL | Status: DC

## 2015-02-01 NOTE — Therapy (Signed)
[facility] [address] [place], [place], [address] Phone: [phone]   Fax:  [phone]  Physical Therapy Treatment  Patient Details  Name: [name] Weber MRN: [id number] Date of Birth: 21-Nov-1931 Referring Provider:  [name], MD  Encounter Date: 02/01/2015      PT End of Session - 02/01/15 0807    Visit Number 4   Number of Visits 25   Date for PT Re-Evaluation 03/24/15   Authorization Type Medicare G-code every 10th visit   PT Start Time 0802   PT Stop Time 0845   PT Time Calculation (min) 43 min   Equipment Utilized During Treatment Gait belt   Activity Tolerance Patient tolerated treatment well   Behavior During Therapy [facility] for tasks assessed/performed      Past Medical History  Diagnosis Date  . Paroxysmal atrial flutter   . Palpitations     OCCASSIONAL  . PAC (premature atrial contraction)     ISOLATED  . PVC's (premature ventricular contractions)     ISOLATED  . Malaise   . Fatigue   . Myalgia     Past Surgical History  Procedure Laterality Date  . Vulvar lesion removal  01/01/2009    She had a 1-cm area of erythema to the right of the urethral meatus  . Thoracic laminectomy for epidural abscess N/A 11/16/2014    Procedure: THORACIC LAMINECTOMY FOR EPIDURAL ABSCESS;  Surgeon: Karn Cassis, MD;  Location: [facility];  Service: Neurosurgery;  Laterality: N/A;    There were no vitals taken for this visit.  Visit Diagnosis:  Muscle weakness of lower extremity  Abnormality of gait  Postural instability of trunk      Subjective Assessment - 02/01/15 0806    Symptoms No new complaints. No falls to report.    Currently in Pain? No/denies   Pain Score 0-No pain     Treatment Exercise On mat with red pball under legs - bridge 5 sec hold x 10 reps -  Hamstring curls 5 sec hold x 10 reps  On mat without ball- cues on abdominal bracing with ex's - table top hold 15  sec hold x 5 reps - table top position with alternating heel taps x 10 each side  Quadruped- min assist with cues needed for balance and ex form - alternating leg extension x 10 each side - alternating combo UE/leg raises x 10 each side  Neuro Re-ed: On red/blue mats at counter - high knee marches, tandem walk, toe walk, heel walk, all forward/backward, x 3 laps each way with min assist and occasional UE assist on counter. Side stepping and [place] x 3 laps each way with min assist and occasional UE assist on counter as well.  Single leg stance by mat with tall cones: - alternating forward toe tap, cross toe taps, forward double toe taps, cross double taps and flip over/up x 10 each with min guard assist to min assist and cues on posture and weight shifting for stance stability.  Standing with feet across blue foam beam: - alternating forward heel taps and back toe taps x 10 each side with no UE assist and up to min assist.  Gait between activities without AD with min guard assist and cues on posture, heel strike and increased BOS.        PT Short Term Goals - 01/23/15 1724    PT SHORT TERM GOAL #1   Title Pt will be independent with  HEP for improved strength, balance, and gait.   Time 4   Period Weeks   Status New   PT SHORT TERM GOAL #2   Title Pt will improve  TUG score to less than or equal to 15 seconds for decreased fall risk.   Time 4   Period Weeks   Status New   PT SHORT TERM GOAL #3   Title Pt will improve Berg Balance score to at least 31/56 for decreased fall risk.   Time 4   Period Weeks   Status New   PT SHORT TERM GOAL #4   Title Pt will perform at least 8 of 10 reps of sit<>stand transfers with minimal UE support, to demonstrate improved lower extremity strength.   Time 4   Period Weeks   Status New           PT Long Term Goals - 01/23/15 1726    PT LONG TERM GOAL #1   Title Pt will verbalize understanding of fall prevention techniques within the  home environment.   Time 8   Period Weeks   Status New   PT LONG TERM GOAL #2   Title Pt will improve Timed UP and Go score to less than or equal to 13.5 seconds for decreased fall risk.   Time 8   Period Weeks   Status New   PT LONG TERM GOAL #3   Title Pt will improve Berg Balance score to at least 41/56 for decreased fall risk.   Time 8   Period Weeks   Status New   PT LONG TERM GOAL #4   Title Pt will ambulate at least 200 ft. using cane or no device, modified independently, for improved functional mobility.   Time 8   Period Weeks   Status New   PT LONG TERM GOAL #5   Title Pt will verbalize understanding of transition to community fitness upon D/C from PT.   Time 8   Period Weeks   Status New               Plan - 02/01/15 0807    Clinical Impression Statement Pt continues to make steady progress toward goals. Gait without  AD in gym between activities today without any balance issues noted with min guard assist. Did not recommend gait out of therapy without AD at this time as pt still fatigues quickly and continues to be challenged with balance activities.                                                   Pt will benefit from skilled therapeutic intervention in order to improve on the following deficits Abnormal gait;Decreased balance;Decreased coordination;Decreased mobility;Decreased strength;Difficulty walking   Rehab Potential Good   PT Frequency 3x / week   PT Duration 8 weeks  plus evaluation   PT Treatment/Interventions ADLs/Self Care Home Management;Functional mobility training;Stair training;Gait training;DME Instruction;Therapeutic activities;Therapeutic exercise;Balance training;Neuromuscular re-education;Patient/family education   PT Next Visit Plan Continue with strengtheining, balance and gait without AD toward STG's.        Problem List Patient Active Problem List   Diagnosis Date Noted  . Anxiety state 12/10/2014  . Neurogenic bladder  11/30/2014  . Neurogenic bowel 11/30/2014  . Bacterial UTI 11/30/2014  . Thoracic myelopathy 11/29/2014  . Persistent atrial fibrillation 11/27/2014  . Traumatic  spinal subdural hematoma   . Acute pulmonary embolism   . Orthostatic hypotension 11/22/2014  . Near syncope 11/21/2014  . Paraplegia at T9 level 11/17/2014  . Weakness of both legs   . Nocturnal leg cramps 07/16/2014  . Encounter for therapeutic drug monitoring 12/25/2013  . Malaise and fatigue 06/01/2011  . Atrial flutter 03/10/2011  . Encounter for long-term (current) use of anticoagulants 03/10/2011  . Benign hypertensive heart disease without heart failure 03/10/2011  . Hypercholesterolemia 03/10/2011  . Paroxysmal atrial flutter   . Palpitations   . PAC (premature atrial contraction)   . PVC's (premature ventricular contractions)   . Malaise   . Fatigue   . Myalgia     Sallyanne Kuster 02/01/2015, 12:15 PM  Sallyanne Kuster, PTA, [facility] [address] [place], [place] [address] [phone] 02/01/2015, 12:15 PM

## 2015-02-01 NOTE — Telephone Encounter (Signed)
Dr [name] note said he would refill Ms [name] Weber digoxin.  This has been sent to [facility].

## 2015-02-03 ENCOUNTER — Other Ambulatory Visit: Payer: Self-pay | Admitting: Adult Health

## 2015-02-05 ENCOUNTER — Encounter: Payer: Self-pay | Admitting: Physical Therapy

## 2015-02-05 ENCOUNTER — Ambulatory Visit: Payer: Medicare Other | Admitting: Physical Therapy

## 2015-02-05 ENCOUNTER — Other Ambulatory Visit: Payer: Self-pay | Admitting: *Deleted

## 2015-02-05 DIAGNOSIS — M6281 Muscle weakness (generalized): Secondary | ICD-10-CM

## 2015-02-05 DIAGNOSIS — R269 Unspecified abnormalities of gait and mobility: Secondary | ICD-10-CM | POA: Diagnosis not present

## 2015-02-05 MED ORDER — TAMSULOSIN HCL 0.4 MG PO CAPS: 0.8000 mg | ORAL_CAPSULE | Freq: Every day | ORAL | Status: DC

## 2015-02-05 NOTE — Therapy (Signed)
[facility] [address] [place], [place], [address] Phone: [phone]   Fax:  [phone]  Physical Therapy Treatment  Patient Details  Name: [name] Weber MRN: [id number] Date of Birth: 02-18-1931 Referring Provider:  [name], MD  Encounter Date: 02/05/2015      PT End of Session - 02/05/15 1059    Visit Number 5   Number of Visits 25   Date for PT Re-Evaluation 03/24/15   Authorization Type Medicare G-code every 10th visit   PT Start Time 1015   PT Stop Time 1059   PT Time Calculation (min) 44 min   Equipment Utilized During Treatment Gait belt   Activity Tolerance Patient tolerated treatment well   Behavior During Therapy [facility] for tasks assessed/performed      Past Medical History  Diagnosis Date  . Paroxysmal atrial flutter   . Palpitations     OCCASSIONAL  . PAC (premature atrial contraction)     ISOLATED  . PVC's (premature ventricular contractions)     ISOLATED  . Malaise   . Fatigue   . Myalgia     Past Surgical History  Procedure Laterality Date  . Vulvar lesion removal  01/01/2009    She had a 1-cm area of erythema to the right of the urethral meatus  . Thoracic laminectomy for epidural abscess N/A 11/16/2014    Procedure: THORACIC LAMINECTOMY FOR EPIDURAL ABSCESS;  Surgeon: Karn Cassis, MD;  Location: [facility];  Service: Neurosurgery;  Laterality: N/A;    There were no vitals taken for this visit.  Visit Diagnosis:  Muscle weakness of lower extremity      Subjective Assessment - 02/05/15 1028    Symptoms No falls or pain to report. Does report bil ankle and foot swelling. Upon checking minimal , if any, swelling noted on either side. Pt advised to keep monitoring and to call her MD if continues to be a concern. Has compression garments at home that her MD discontinued. Advised pt. to check with her MD before resuming use as he is the one who discontinued them                                                 Currently in Pain? No/denies   Pain Score 0-No pain      Exercises: On mat without ball-  Cues on abdominal bracing with ex's -table top 15 sec hold x 5 reps -table top position with alternating heel taps x 10 each side  On mat with Red pball under legs -bridge 5 sec hold x 10 reps -Hamstring curls 5 sec hold x 10 reps  Quadruped min assist with cues needed for balance and ex form -alternating leg extension x 10 each side -alternating combo UE/leg raises x 10 each side  Sidelying- [place] hip progression Stages 1 and 2 -Stage 1- Resisted red theraband "clamshells" -Stage 2- No resistance- hip internal rotation with knees together "top ankle moves towards/away from the ceiling"  Neuro Re-ed At table with min guard: -On blue foam log- Standing with feet across log- Alternating Heel taps 10 x each side, Alternating Toe taps 10 x each side -Airex- lateral heel taps 10 x each alternating -Single leg stance with cones:  Fwd toe taps 10 x each, alternating toe taps 10 x each, fwd double toe  taps 10 x each, alternating double toe taps 10 x each, flip cone over/up 10 x each -Single leg stance with 5 bubbles in semi arc: toe taps 5 x each side        PT Short Term Goals - 01/23/15 1724    PT SHORT TERM GOAL #1   Title Pt will be independent with HEP for improved strength, balance, and gait.   Time 4   Period Weeks   Status New   PT SHORT TERM GOAL #2   Title Pt will improve  TUG score to less than or equal to 15 seconds for decreased fall risk.   Time 4   Period Weeks   Status New   PT SHORT TERM GOAL #3   Title Pt will improve Berg Balance score to at least 31/56 for decreased fall risk.   Time 4   Period Weeks   Status New   PT SHORT TERM GOAL #4   Title Pt will perform at least 8 of 10 reps of sit<>stand transfers with minimal UE support, to demonstrate improved lower extremity strength.   Time 4   Period Weeks   Status New            PT Long Term Goals - 01/23/15 1726    PT LONG TERM GOAL #1   Title Pt will verbalize understanding of fall prevention techniques within the home environment.   Time 8   Period Weeks   Status New   PT LONG TERM GOAL #2   Title Pt will improve Timed UP and Go score to less than or equal to 13.5 seconds for decreased fall risk.   Time 8   Period Weeks   Status New   PT LONG TERM GOAL #3   Title Pt will improve Berg Balance score to at least 41/56 for decreased fall risk.   Time 8   Period Weeks   Status New   PT LONG TERM GOAL #4   Title Pt will ambulate at least 200 ft. using cane or no device, modified independently, for improved functional mobility.   Time 8   Period Weeks   Status New   PT LONG TERM GOAL #5   Title Pt will verbalize understanding of transition to community fitness upon D/C from PT.   Time 8   Period Weeks   Status New           Plan - 02/05/15 1059    Clinical Impression Statement Pt tolerated Treatment well today with greater fatigue in her RLE towards the end.     Pt will benefit from skilled therapeutic intervention in order to improve on the following deficits Abnormal gait;Decreased balance;Decreased coordination;Decreased mobility;Decreased strength;Difficulty walking   Rehab Potential Good   PT Frequency 3x / week   PT Duration 8 weeks  plus evaluation   PT Treatment/Interventions ADLs/Self Care Home Management;Functional mobility training;Stair training;Gait training;DME Instruction;Therapeutic activities;Therapeutic exercise;Balance training;Neuromuscular re-education;Patient/family education   PT Next Visit Plan Continue with strengtheining, balance and gait without AD toward STG's.      Problem List Patient Active Problem List   Diagnosis Date Noted  . Anxiety state 12/10/2014  . Neurogenic bladder 11/30/2014  . Neurogenic bowel 11/30/2014  . Bacterial UTI 11/30/2014  . Thoracic myelopathy 11/29/2014  . Persistent atrial  fibrillation 11/27/2014  . Traumatic spinal subdural hematoma   . Acute pulmonary embolism   . Orthostatic hypotension 11/22/2014  . Near syncope 11/21/2014  . Paraplegia at T9 level 11/17/2014  .  Weakness of both legs   . Nocturnal leg cramps 07/16/2014  . Encounter for therapeutic drug monitoring 12/25/2013  . Malaise and fatigue 06/01/2011  . Atrial flutter 03/10/2011  . Encounter for long-term (current) use of anticoagulants 03/10/2011  . Benign hypertensive heart disease without heart failure 03/10/2011  . Hypercholesterolemia 03/10/2011  . Paroxysmal atrial flutter   . Palpitations   . PAC (premature atrial contraction)   . PVC's (premature ventricular contractions)   . Malaise   . Fatigue   . Myalgia     Kemoni Quesenberry 02/05/2015, 11:10 AM  [facility] [address] [place], [place], [address] Phone: [phone]   Fax:  [phone]

## 2015-02-06 ENCOUNTER — Ambulatory Visit: Payer: Medicare Other | Admitting: Physical Therapy

## 2015-02-06 DIAGNOSIS — R6889 Other general symptoms and signs: Secondary | ICD-10-CM

## 2015-02-06 DIAGNOSIS — R269 Unspecified abnormalities of gait and mobility: Secondary | ICD-10-CM | POA: Diagnosis not present

## 2015-02-06 NOTE — Patient Instructions (Signed)
SINGLE LIMB STANCE   Stance: single leg on floor. Raise leg. Hold _10__ seconds. Repeat with other leg.  Use 1-2 fingers of light support as needed 5___ reps per set, __1-2_ sets per day  Copyright  VHI. All rights reserved.  Tandem Stance   Right foot in front of left, heel touching toe both feet "straight ahead". Stand on Foot Triangle of Support with both feet. Balance in this position _30__ seconds. Do with left foot in front of right.  Repeat 5 times each position. Hold on lightly if needed.  Copyright  VHI. All rights reserved.  Tandem Walking   [facility]>then MARCH with each foot directly in front of other, heel of one foot touching toes of other foot with each step. Both feet straight ahead.  Hold on lightly to the counter as needed.   Copyright  VHI. All rights reserved.

## 2015-02-06 NOTE — Therapy (Signed)
[facility] [address] [place], [place], [address] Phone: [phone]   Fax:  [phone]  Physical Therapy Treatment  Patient Details  Name: [name] Weber MRN: [id number] Date of Birth: 20-Aug-1931 Referring Provider:  [name], MD  Encounter Date: 02/06/2015      PT End of Session - 02/07/15 1241    Visit Number 6   Number of Visits 25   Date for PT Re-Evaluation 03/24/15   Authorization Type Medicare G-code every 10th visit   PT Start Time 0935   PT Stop Time 1015   PT Time Calculation (min) 40 min   Equipment Utilized During Treatment Gait belt   Activity Tolerance Patient tolerated treatment well      Past Medical History  Diagnosis Date  . Paroxysmal atrial flutter   . Palpitations     OCCASSIONAL  . PAC (premature atrial contraction)     ISOLATED  . PVC's (premature ventricular contractions)     ISOLATED  . Malaise   . Fatigue   . Myalgia     Past Surgical History  Procedure Laterality Date  . Vulvar lesion removal  01/01/2009    She had a 1-cm area of erythema to the right of the urethral meatus  . Thoracic laminectomy for epidural abscess N/A 11/16/2014    Procedure: THORACIC LAMINECTOMY FOR EPIDURAL ABSCESS;  Surgeon: Karn Cassis, MD;  Location: [facility];  Service: Neurosurgery;  Laterality: N/A;    There were no vitals taken for this visit.  Visit Diagnosis:  Postural instability of trunk  Abnormality of gait      Subjective Assessment - 02/07/15 1227    Symptoms No changes, doing exercises at home.   Currently in Pain? No/denies     Therapeutic Exercise:  With legs on red therapy ball:  Hip/knee flexion 15 reps, trunk rotation 15 reps, bridging x 10 reps.  With legs supported at mat in hooklying position:  Bridging with SLRx 10 reps, then bridging with hooklying abduction x 5 reps, then bridging with marching x 5 reps.  (Added to HEP)  Tall kneeling<>sit back  on heels x 10 reps, sidestepping in tall kneeling x 8 ft, R and L 3 times with intermittent UE support.  Quadruped hip/knee extension with coordinated opposite arm lifts x 5 reps with min assist to prevent R lateral lean.  Balance Exercises:  At counter:  Tandem stance 3 x 30 seconds each position, single limb stance 3 x 30 seconds with minimal UE support; tandem gait 2 x 10 ft, then tandem marching 2 x 10 ft with intermittent UE support and cues to use visual target for improved stability.  Standing on blue foam balance beam at counter:  minisquats x 10 reps, marching in place, forward kicks, forward step taps, back step taps x 10 reps each.  Pt requires UE support and min guard assistance.                   PT Education - 02/07/15 1228    Education provided Yes   Education Details HEP- core strength and balance   Person(s) Educated Patient   Methods Explanation;Demonstration;Handout   Comprehension Verbalized understanding;Returned demonstration          PT Short Term Goals - 01/23/15 1724    PT SHORT TERM GOAL #1   Title Pt will be independent with HEP for improved strength, balance, and gait.   Time 4   Period Weeks  Status New   PT SHORT TERM GOAL #2   Title Pt will improve  TUG score to less than or equal to 15 seconds for decreased fall risk.   Time 4   Period Weeks   Status New   PT SHORT TERM GOAL #3   Title Pt will improve Berg Balance score to at least 31/56 for decreased fall risk.   Time 4   Period Weeks   Status New   PT SHORT TERM GOAL #4   Title Pt will perform at least 8 of 10 reps of sit<>stand transfers with minimal UE support, to demonstrate improved lower extremity strength.   Time 4   Period Weeks   Status New           PT Long Term Goals - 01/23/15 1726    PT LONG TERM GOAL #1   Title Pt will verbalize understanding of fall prevention techniques within the home environment.   Time 8   Period Weeks   Status New   PT LONG TERM  GOAL #2   Title Pt will improve Timed UP and Go score to less than or equal to 13.5 seconds for decreased fall risk.   Time 8   Period Weeks   Status New   PT LONG TERM GOAL #3   Title Pt will improve Berg Balance score to at least 41/56 for decreased fall risk.   Time 8   Period Weeks   Status New   PT LONG TERM GOAL #4   Title Pt will ambulate at least 200 ft. using cane or no device, modified independently, for improved functional mobility.   Time 8   Period Weeks   Status New   PT LONG TERM GOAL #5   Title Pt will verbalize understanding of transition to community fitness upon D/C from PT.   Time 8   Period Weeks   Status New               Plan - 02/07/15 1242    Clinical Impression Statement Pt feels and appears that she is getting stronger; added balance and core strengthening exercises to HEP.  Pt will continue to benefit from further skilled PT to address strength, balance and gait.   Pt will benefit from skilled therapeutic intervention in order to improve on the following deficits Abnormal gait;Decreased balance;Decreased coordination;Decreased mobility;Decreased strength;Difficulty walking   Rehab Potential Good   PT Frequency 3x / week   PT Duration 8 weeks   PT Treatment/Interventions ADLs/Self Care Home Management;Functional mobility training;Stair training;Gait training;DME Instruction;Therapeutic activities;Therapeutic exercise;Balance training;Neuromuscular re-education;Patient/family education   PT Next Visit Plan Reveiw HEP; Continue with strengtheining, balance and gait without AD toward STG's.        Problem List Patient Active Problem List   Diagnosis Date Noted  . Anxiety state 12/10/2014  . Neurogenic bladder 11/30/2014  . Neurogenic bowel 11/30/2014  . Bacterial UTI 11/30/2014  . Thoracic myelopathy 11/29/2014  . Persistent atrial fibrillation 11/27/2014  . Traumatic spinal subdural hematoma   . Acute pulmonary embolism   . Orthostatic  hypotension 11/22/2014  . Near syncope 11/21/2014  . Paraplegia at T9 level 11/17/2014  . Weakness of both legs   . Nocturnal leg cramps 07/16/2014  . Encounter for therapeutic drug monitoring 12/25/2013  . Malaise and fatigue 06/01/2011  . Atrial flutter 03/10/2011  . Encounter for long-term (current) use of anticoagulants 03/10/2011  . Benign hypertensive heart disease without heart failure 03/10/2011  . Hypercholesterolemia 03/10/2011  .  Paroxysmal atrial flutter   . Palpitations   . PAC (premature atrial contraction)   . PVC's (premature ventricular contractions)   . Malaise   . Fatigue   . Myalgia     Crewe Heathman W. 02/07/2015, 12:44 PM  Lonia Blood, PT 02/07/2015 12:44 PM Phone: [phone] Fax: [phone]   [facility] [address] [place], [place], [address] Phone: [phone]   Fax:  [phone]

## 2015-02-08 ENCOUNTER — Ambulatory Visit: Payer: Medicare Other | Admitting: Physical Therapy

## 2015-02-08 ENCOUNTER — Encounter: Payer: Self-pay | Admitting: Physical Therapy

## 2015-02-08 DIAGNOSIS — M6281 Muscle weakness (generalized): Secondary | ICD-10-CM

## 2015-02-08 DIAGNOSIS — R269 Unspecified abnormalities of gait and mobility: Secondary | ICD-10-CM | POA: Diagnosis not present

## 2015-02-08 NOTE — Therapy (Signed)
[facility] [address] [place], [place], [address] Phone: [phone]   Fax:  [phone]  Physical Therapy Treatment  Patient Details  Name: [name] Weber MRN: [id number] Date of Birth: 11-12-1931 Referring Provider:  [name], MD  Encounter Date: 02/08/2015      PT End of Session - 02/08/15 1104    Visit Number 7   Number of Visits 25   Date for PT Re-Evaluation 03/24/15   Authorization Type Medicare G-code every 10th visit   PT Start Time 1015   PT Stop Time 1100   PT Time Calculation (min) 45 min   Equipment Utilized During Treatment Gait belt   Activity Tolerance Patient tolerated treatment well      Past Medical History  Diagnosis Date  . Paroxysmal atrial flutter   . Palpitations     OCCASSIONAL  . PAC (premature atrial contraction)     ISOLATED  . PVC's (premature ventricular contractions)     ISOLATED  . Malaise   . Fatigue   . Myalgia     Past Surgical History  Procedure Laterality Date  . Vulvar lesion removal  01/01/2009    She had a 1-cm area of erythema to the right of the urethral meatus  . Thoracic laminectomy for epidural abscess N/A 11/16/2014    Procedure: THORACIC LAMINECTOMY FOR EPIDURAL ABSCESS;  Surgeon: Karn Cassis, MD;  Location: [facility];  Service: Neurosurgery;  Laterality: N/A;    There were no vitals filed for this visit.  Visit Diagnosis:  Muscle weakness of lower extremity      Subjective Assessment - 02/07/15 1227    Symptoms No changes, doing exercises at home.   Currently in Pain? No/denies       Exercises: -Bridge with red pball 5 second holds x 10 reps with hands across chest.   -Bridge with red pball plus hamstring curls 10 x with hands on mat -with red pball- trunk rotations 3 x 20 seconds -Table top with soccer ball transfers from arms overhead to static lower extremity.   -Quadruped- alternating opposite arm/leg extensions 10  x each side -Side lying clams 10 x each side -Side lying hip internal rotation 10 x each side  Neuro Re-ed: -on side by side red and blue mats at counter with gait belt.  High knee marching 3 x fwd/retro.  Tandem 3 x fwd/retro (one hand assist on counter).  Tip toe walking 3 x fwd/retro.  Heel walking 3 x fwd/retro (one hand assist on counter).   -on side by side using two red mats with gait belt at counter.  6 cones spaced out in a long row- standing perpendicular to the row- alternating toe taps at each cone 1 x down and back.  Alternating crossover toe taps standing between cones 1 x down and back.  Alternating double toe taps at each cone 1 x down and back.  Alternating crossover double toe taps 1 x down and back.   -with 5 bubbles in semi arc form on red mat with gait belt.  Toe taps 3 x each.  Increased stance time with going around the world and back 3 x each leg.          PT Short Term Goals - 01/23/15 1724    PT SHORT TERM GOAL #1   Title Pt will be independent with HEP for improved strength, balance, and gait.   Time 4   Period Weeks   Status  New   PT SHORT TERM GOAL #2   Title Pt will improve  TUG score to less than or equal to 15 seconds for decreased fall risk.   Time 4   Period Weeks   Status New   PT SHORT TERM GOAL #3   Title Pt will improve Berg Balance score to at least 31/56 for decreased fall risk.   Time 4   Period Weeks   Status New   PT SHORT TERM GOAL #4   Title Pt will perform at least 8 of 10 reps of sit<>stand transfers with minimal UE support, to demonstrate improved lower extremity strength.   Time 4   Period Weeks   Status New           PT Long Term Goals - 01/23/15 1726    PT LONG TERM GOAL #1   Title Pt will verbalize understanding of fall prevention techniques within the home environment.   Time 8   Period Weeks   Status New   PT LONG TERM GOAL #2   Title Pt will improve Timed UP and Go score to less than or equal to 13.5 seconds for  decreased fall risk.   Time 8   Period Weeks   Status New   PT LONG TERM GOAL #3   Title Pt will improve Berg Balance score to at least 41/56 for decreased fall risk.   Time 8   Period Weeks   Status New   PT LONG TERM GOAL #4   Title Pt will ambulate at least 200 ft. using cane or no device, modified independently, for improved functional mobility.   Time 8   Period Weeks   Status New   PT LONG TERM GOAL #5   Title Pt will verbalize understanding of transition to community fitness upon D/C from PT.   Time 8   Period Weeks   Status New      \      Plan - 02/08/15 1104    Clinical Impression Statement Pt challenged with single leg stance cone activities on mat today.  Pt is progressing well towards goals for gait and balance.     Pt will benefit from skilled therapeutic intervention in order to improve on the following deficits Abnormal gait;Decreased balance;Decreased coordination;Decreased mobility;Decreased strength;Difficulty walking   Rehab Potential Good   PT Frequency 3x / week   PT Duration 8 weeks   PT Treatment/Interventions ADLs/Self Care Home Management;Functional mobility training;Stair training;Gait training;DME Instruction;Therapeutic activities;Therapeutic exercise;Balance training;Neuromuscular re-education;Patient/family education   PT Next Visit Plan  Continue with strengtheining, balance and gait without AD toward STG's.        Problem List Patient Active Problem List   Diagnosis Date Noted  . Anxiety state 12/10/2014  . Neurogenic bladder 11/30/2014  . Neurogenic bowel 11/30/2014  . Bacterial UTI 11/30/2014  . Thoracic myelopathy 11/29/2014  . Persistent atrial fibrillation 11/27/2014  . Traumatic spinal subdural hematoma   . Acute pulmonary embolism   . Orthostatic hypotension 11/22/2014  . Near syncope 11/21/2014  . Paraplegia at T9 level 11/17/2014  . Weakness of both legs   . Nocturnal leg cramps 07/16/2014  . Encounter for therapeutic drug  monitoring 12/25/2013  . Malaise and fatigue 06/01/2011  . Atrial flutter 03/10/2011  . Encounter for long-term (current) use of anticoagulants 03/10/2011  . Benign hypertensive heart disease without heart failure 03/10/2011  . Hypercholesterolemia 03/10/2011  . Paroxysmal atrial flutter   . Palpitations   . PAC (premature atrial  contraction)   . PVC's (premature ventricular contractions)   . Malaise   . Fatigue   . Myalgia     Ezzie Dural 02/08/2015, 11:07 AM  [facility] [address] [place], [place], [address] Phone: [phone]   Fax:  [phone]

## 2015-02-10 NOTE — Progress Notes (Signed)
Patient ID: [name] Weber, female   DOB: [birth year], 81 y.o.   MRN: [id number]     [facility]   PCP: [name], MD  Code status- full code  Allergies  Allergen Reactions  . Crestor [Rosuvastatin Calcium]     Chief Complaint  Patient presents with  . New Admit To SNF     HPI:  81 year old patient is here for short term rehabilitation post hospital admission from 11/29/14-12/17/14 and inpatient rehabilitation admission until 12/17/14. She was admitted to the hospital with new onset low back pain, weakness and inability to walk. MRI of spine showed cord compression. Coumadin was reversed and she underwent thoracic laminectomy T11-T12, decompression of epidural space and evacuation of SDH. Postop she had atrial flutter and hypotension. She was diagnosed with  RLL PE and left leg DVT. IVC filter was placed and she was put on aspirin for secondary prevention. She was also treated for e.coli uti. She then underwent inpatient rehabilitation. She has past medical history of afib, hyperlipidemia.  She is seen in her room today. She had a bowel movement today and feels better. She is able to sense the urge to urinate. Her pain is under control. Denies any other concerns. Is working with therapy team.  Review of Systems:  Constitutional: Negative for fever, chills,diaphoresis.  HENT: Negative for headache, congestion Respiratory: Negative for cough, shortness of breath and wheezing.   Cardiovascular: Negative for chest pain, palpitations, leg swelling.  Gastrointestinal: Negative for heartburn, nausea, vomiting, abdominal pain Genitourinary: Negative for dysuria and flank pain.  Musculoskeletal: Negative for falls Skin: Negative for itching, rash.  Neurological: positive for weakness  Psychiatric/Behavioral: Negative for depression   Past Medical History  Diagnosis Date  . Paroxysmal atrial flutter   . Palpitations     OCCASSIONAL  . PAC  (premature atrial contraction)     ISOLATED  . PVC's (premature ventricular contractions)     ISOLATED  . Malaise   . Fatigue   . Myalgia    Past Surgical History  Procedure Laterality Date  . Vulvar lesion removal  01/01/2009    She had a 1-cm area of erythema to the right of the urethral meatus  . Thoracic laminectomy for epidural abscess N/A 11/16/2014    Procedure: THORACIC LAMINECTOMY FOR EPIDURAL ABSCESS;  Surgeon: Karn Cassis, MD;  Location: [facility];  Service: Neurosurgery;  Laterality: N/A;   Social History:   reports that she quit smoking about 23 years ago. She does not have any smokeless tobacco history on file. She reports that she does not drink alcohol or use illicit drugs.  Family History  Problem Relation Age of Onset  . Hyperlipidemia Mother     Medications: Medication reviewed. See MAR  Physical Exam: Filed Vitals:   12/20/14 1440  BP: 118/71  Pulse: 87  Temp: 96.5 F (35.8 C)  Resp: 16  SpO2: 90%    General- elderly female, well built, in no acute distress Head- normocephalic, atraumatic, hard of hearing, has hearing aid Throat- moist mucus membrane Eyes- no pallor, no icterus, no discharge, normal conjunctiva, normal sclera Neck- no cervical lymphadenopathy Cardiovascular- irregular heart rate, no murmurs, palpable dorsalis pedis and radial pulses, trace leg edema Respiratory- bilateral clear to auscultation, no wheeze, no rhonchi, no crackles, no use of accessory muscles Abdomen- bowel sounds present, soft, non tender Musculoskeletal- able to move all 4 extremities, generalized weakness  Skin- warm and dry, surgical incision in the back  is open to air and healing well Psychiatry- alert and oriented to person, place and time, normal mood and affect    Labs reviewed: Basic Metabolic Panel:  Recent Labs  40/98/1100/03/15 0500 12/05/14 0555 12/10/14 1013  NA 135 136 133*  K 3.9 3.8 3.5  CL 100 102 98  CO2 29 27 32  GLUCOSE 96 96 112*    BUN 5* 5* <5*  CREATININE 0.55 0.60 0.65  CALCIUM 8.2* 8.2* 8.3*   Liver Function Tests:  Recent Labs  11/16/14 1000 11/30/14 0713 12/05/14 0555  AST 41* 25 38*  ALT 33 17 30  ALKPHOS 56 66 69  BILITOT 0.6 0.6 0.6  PROT 7.0 5.0* 5.2*  ALBUMIN 3.6 2.2* 2.5*   No results for input(s): LIPASE, AMYLASE in the last 8760 hours. No results for input(s): AMMONIA in the last 8760 hours. CBC:  Recent Labs  11/26/14 1015  11/30/14 0713 12/05/14 0555 12/10/14 1013  WBC 5.9  < > 5.5 3.8* 4.9  NEUTROABS 4.3  --  3.8  --  3.4  HGB 11.7*  < > 11.6* 11.9* 12.5  HCT 34.4*  < > 34.4* 35.9* 38.1  MCV 96.6  < > 98.0 98.4 97.7  PLT 193  < > 220 209 217  < > = values in this interval not displayed.  Assessment/Plan  Paraplegia at T9 level S/p thoracic laminectomy, decompression and hematoma evacuation. Will have her work with physical therapy and occupational therapy team to help with gait training and muscle strengthening exercises.fall precautions. Skin care. Encourage to be out of bed. Has follow-up with neurosurgery - Dr Jeral FruitBotero  Neurogenic bladder continue Flomax 0.4 mg 2 capsules daily, toilet and bladder training, check post void residual Q 8 hours and catheterize if PVR is>350 cc  Neurogenic bowel Continue dulcolax suppository for now  PE/DVT S/p IVC filter, continue aspirin  Orthostatic hypotension continue Florinef 0.1 mg daily  afib rate controlled. continue digoxin 0.125 mg daily and aspirin 81 mgdaily. Has cardiology f/u with Dr. Patty SermonsBrackbill  Hyperlipidemia continue atorvastatin 10 mg daily  Osteoporosis continue Fosamax     Goals of care: short term rehabilitation   Labs/tests ordered: cbc, bmp  Family/ staff Communication: reviewed care plan with patient and nursing supervisor    Oneal GroutMAHIMA Storm Sovine, MD  [facility] [phone] (Monday-Friday 8 am - 5 pm) [phone] (afterhours)

## 2015-02-11 ENCOUNTER — Ambulatory Visit: Payer: Medicare Other | Admitting: Physical Therapy

## 2015-02-11 DIAGNOSIS — R600 Localized edema: Secondary | ICD-10-CM | POA: Insufficient documentation

## 2015-02-13 ENCOUNTER — Ambulatory Visit (HOSPITAL_COMMUNITY): Payer: Medicare Other | Attending: Cardiovascular Disease | Admitting: Radiology

## 2015-02-13 ENCOUNTER — Ambulatory Visit: Payer: Medicare Other | Admitting: Physical Therapy

## 2015-02-13 ENCOUNTER — Telehealth: Payer: Self-pay | Admitting: *Deleted

## 2015-02-13 ENCOUNTER — Other Ambulatory Visit: Payer: Self-pay

## 2015-02-13 ENCOUNTER — Other Ambulatory Visit (HOSPITAL_COMMUNITY): Payer: Self-pay | Admitting: Nurse Practitioner

## 2015-02-13 DIAGNOSIS — M6281 Muscle weakness (generalized): Secondary | ICD-10-CM

## 2015-02-13 DIAGNOSIS — I081 Rheumatic disorders of both mitral and tricuspid valves: Secondary | ICD-10-CM | POA: Diagnosis not present

## 2015-02-13 DIAGNOSIS — I10 Essential (primary) hypertension: Secondary | ICD-10-CM | POA: Insufficient documentation

## 2015-02-13 DIAGNOSIS — I4891 Unspecified atrial fibrillation: Secondary | ICD-10-CM | POA: Diagnosis not present

## 2015-02-13 DIAGNOSIS — R269 Unspecified abnormalities of gait and mobility: Secondary | ICD-10-CM | POA: Diagnosis not present

## 2015-02-13 DIAGNOSIS — I4892 Unspecified atrial flutter: Secondary | ICD-10-CM | POA: Insufficient documentation

## 2015-02-13 DIAGNOSIS — Z87891 Personal history of nicotine dependence: Secondary | ICD-10-CM | POA: Diagnosis not present

## 2015-02-13 DIAGNOSIS — Z86711 Personal history of pulmonary embolism: Secondary | ICD-10-CM | POA: Diagnosis not present

## 2015-02-13 DIAGNOSIS — E785 Hyperlipidemia, unspecified: Secondary | ICD-10-CM | POA: Diagnosis not present

## 2015-02-13 NOTE — Therapy (Signed)
[facility] [address] [place], [place], [address] Phone: [phone]   Fax:  [phone]  Physical Therapy Treatment  Patient Details  Name: [name] Weber MRN: [id number] Date of Birth: 03/22/1931 Referring Provider:  Eartha InchBadger, Michael C, MD  Encounter Date: 02/13/2015      PT End of Session - 02/13/15 1314    Visit Number 8   Number of Visits 25   Date for PT Re-Evaluation 03/24/15   Authorization Type Medicare G-code every 10th visit   PT Start Time 1102   PT Stop Time 1145   PT Time Calculation (min) 43 min   Equipment Utilized During Treatment Gait belt   Activity Tolerance Patient tolerated treatment well   Behavior During Therapy [facility] for tasks assessed/performed      Past Medical History  Diagnosis Date  . Paroxysmal atrial flutter   . Palpitations     OCCASSIONAL  . PAC (premature atrial contraction)     ISOLATED  . PVC's (premature ventricular contractions)     ISOLATED  . Malaise   . Fatigue   . Myalgia     Past Surgical History  Procedure Laterality Date  . Vulvar lesion removal  01/01/2009    She had a 1-cm area of erythema to the right of the urethral meatus  . Thoracic laminectomy for epidural abscess N/A 11/16/2014    Procedure: THORACIC LAMINECTOMY FOR EPIDURAL ABSCESS;  Surgeon: Karn CassisErnesto M Botero, MD;  Location: [facility];  Service: Neurosurgery;  Laterality: N/A;    There were no vitals filed for this visit.  Visit Diagnosis:  Abnormality of gait  Muscle weakness of lower extremity      Subjective Assessment - 02/13/15 1059    Symptoms Sorry that I missed you on MOnday when you were out sick; had ECHO for lower extremity swelling; to follow up with physician on that; pt is wearing compression stockings today.   Currently in Pain? No/denies                       [facility] Adult PT Treatment/Exercise - 02/13/15 0001    Ambulation/Gait   Ambulation/Gait Yes    Ambulation/Gait Assistance 4: Min guard   Ambulation Distance (Feet) 220 Feet  x 2, then 400 feet   Assistive device --  tripod cane   Gait Pattern Wide base of support   Ambulation Surface Level;Indoor   Gait Comments During 400 ft of gait with tripod cane, pt performed environmental scanning tasks; no LOB noted.     High Level Balance   High Level Balance Activities Side stepping;Braiding;Backward walking;Tandem walking;Marching forwards;Marching backwards  tandem march, crossover steps fwd/back on red mat min assist   High Level Balance Comments Corner balance exercises on pillow:  feet apart, feet together with head turns/head nods 2 sets x 10, then eyes closed 2 sets x 10 seconds with intermittent UE support     balance exercises performed on mat, 3 reps 15 ft each direction with minimal assistance.  Pt has narrowed base of support and difficulty with crossover steps with R in posterior position.           PT Education - 02/13/15 1314    Education provided Yes   Education Details progression to tripod base cane; trialed tripod base cane and discussed transition from RW to tripod cane over the course of continued therapy practice   Person(s) Educated Patient   Methods Explanation;Demonstration;Handout   Comprehension Verbalized  understanding          PT Short Term Goals - 01/23/15 1724    PT SHORT TERM GOAL #1   Title Pt will be independent with HEP for improved strength, balance, and gait.   Time 4   Period Weeks   Status New   PT SHORT TERM GOAL #2   Title Pt will improve  TUG score to less than or equal to 15 seconds for decreased fall risk.   Time 4   Period Weeks   Status New   PT SHORT TERM GOAL #3   Title Pt will improve Berg Balance score to at least 31/56 for decreased fall risk.   Time 4   Period Weeks   Status New   PT SHORT TERM GOAL #4   Title Pt will perform at least 8 of 10 reps of sit<>stand transfers with minimal UE support, to demonstrate  improved lower extremity strength.   Time 4   Period Weeks   Status New           PT Long Term Goals - 01/23/15 1726    PT LONG TERM GOAL #1   Title Pt will verbalize understanding of fall prevention techniques within the home environment.   Time 8   Period Weeks   Status New   PT LONG TERM GOAL #2   Title Pt will improve Timed UP and Go score to less than or equal to 13.5 seconds for decreased fall risk.   Time 8   Period Weeks   Status New   PT LONG TERM GOAL #3   Title Pt will improve Berg Balance score to at least 41/56 for decreased fall risk.   Time 8   Period Weeks   Status New   PT LONG TERM GOAL #4   Title Pt will ambulate at least 200 ft. using cane or no device, modified independently, for improved functional mobility.   Time 8   Period Weeks   Status New   PT LONG TERM GOAL #5   Title Pt will verbalize understanding of transition to community fitness upon D/C from PT.   Time 8   Period Weeks   Status New               Plan - 02/13/15 1315    Clinical Impression Statement Pt has increased sway with compliant surface corner activities and has increased difficulty with tandem and marching tandem on compliant surfaces.   Pt will benefit from skilled therapeutic intervention in order to improve on the following deficits Abnormal gait;Decreased balance;Decreased coordination;Decreased mobility;Decreased strength;Difficulty walking   Rehab Potential Good   PT Frequency 3x / week   PT Duration 8 weeks   PT Treatment/Interventions ADLs/Self Care Home Management;Functional mobility training;Stair training;Gait training;DME Instruction;Therapeutic activities;Therapeutic exercise;Balance training;Neuromuscular re-education;Patient/family education   PT Next Visit Plan Corner balance and compliant surface activities; gait training with tripod cane with cognitive tasks and environmental scanning; outdoor gait   Consulted and Agree with Plan of Care Patient         Problem List Patient Active Problem List   Diagnosis Date Noted  . Anxiety state 12/10/2014  . Neurogenic bladder 11/30/2014  . Neurogenic bowel 11/30/2014  . Bacterial UTI 11/30/2014  . Thoracic myelopathy 11/29/2014  . Persistent atrial fibrillation 11/27/2014  . Traumatic spinal subdural hematoma   . Acute pulmonary embolism   . Orthostatic hypotension 11/22/2014  . Near syncope 11/21/2014  . Paraplegia at T9 level 11/17/2014  .  Weakness of both legs   . Nocturnal leg cramps 07/16/2014  . Encounter for therapeutic drug monitoring 12/25/2013  . Malaise and fatigue 06/01/2011  . Atrial flutter 03/10/2011  . Encounter for long-term (current) use of anticoagulants 03/10/2011  . Benign hypertensive heart disease without heart failure 03/10/2011  . Hypercholesterolemia 03/10/2011  . Paroxysmal atrial flutter   . Palpitations   . PAC (premature atrial contraction)   . PVC's (premature ventricular contractions)   . Malaise   . Fatigue   . Myalgia     Victora Irby W. 02/13/2015, 1:17 PM  Lonia Blood, PT 02/13/2015 1:19 PM Phone: [phone] Fax: [phone]   [facility] [address] [place], [place], [address] Phone: [phone]   Fax:  [phone]

## 2015-02-13 NOTE — Telephone Encounter (Signed)
Pt here for Echo and at check out she told Bonita QuinLinda that she wanted to talk to a nurse about swelling in legs.  Went to evaluate her and "her neighbor" states that she has had swelling in legs x 1 month.  [facility] but in further discussion states he dismissed her yesterday.  Could not find any documentation that she was dismissed.  States she had emergency surgery Nov 16 2014 for "clot on T9 -T11.  Has been in Physical therapy since then.  Was told by "someone" that she needed to have a PCP in order to change to another cardiologist.  Has an appointment 4/6 with Dr. Jerelyn ScottN. [name] but due to swelling saw his NP Samatha Tann.  She ordered an Echo which she had today. Advised that the echo would not be read until later today.  Advised that until Echo was read and reviewed by Cardiologist the swelling could not be determined if related to her heart or from her back.  No SOB, wt gain 6 lbs over the last month.  On exam of ankles minimal swelling.  Advised her to call Ms.Tann at Dr. Kevan NyGates office once Echo has been reviewed and they can refer her to someone in the practice. She verbalizes understanding and will wait for Echo results.

## 2015-02-13 NOTE — Patient Instructions (Signed)
Try to find a tripod base cane and bring into therapy for sizing and practice.  The one we used in therapy session today was Abletripod.

## 2015-02-14 ENCOUNTER — Ambulatory Visit: Payer: Medicare Other | Admitting: Physical Therapy

## 2015-02-14 DIAGNOSIS — R269 Unspecified abnormalities of gait and mobility: Secondary | ICD-10-CM

## 2015-02-14 DIAGNOSIS — R6889 Other general symptoms and signs: Secondary | ICD-10-CM

## 2015-02-14 NOTE — Therapy (Signed)
[facility] [address] [place], [place], [address] Phone: [phone]   Fax:  [phone]  Physical Therapy Treatment  Patient Details  Name: [name] Weber MRN: [id number] Date of Birth: 13-Apr-1931 Referring Provider:  [name], MD  Encounter Date: 02/14/2015      PT End of Session - 02/14/15 1201    Visit Number 9   Number of Visits 25   Date for PT Re-Evaluation 03/24/15   Authorization Type Medicare G-code every 10th visit   PT Start Time 1102   PT Stop Time 1144   PT Time Calculation (min) 42 min   Equipment Utilized During Treatment Gait belt   Activity Tolerance Patient tolerated treatment well   Behavior During Therapy [facility] for tasks assessed/performed      Past Medical History  Diagnosis Date  . Paroxysmal atrial flutter   . Palpitations     OCCASSIONAL  . PAC (premature atrial contraction)     ISOLATED  . PVC's (premature ventricular contractions)     ISOLATED  . Malaise   . Fatigue   . Myalgia     Past Surgical History  Procedure Laterality Date  . Vulvar lesion removal  01/01/2009    She had a 1-cm area of erythema to the right of the urethral meatus  . Thoracic laminectomy for epidural abscess N/A 11/16/2014    Procedure: THORACIC LAMINECTOMY FOR EPIDURAL ABSCESS;  Surgeon: Karn Cassis, MD;  Location: [facility];  Service: Neurosurgery;  Laterality: N/A;    There were no vitals filed for this visit.  Visit Diagnosis:  Abnormality of gait  Postural instability of trunk      Subjective Assessment - 02/14/15 1152    Symptoms I bought a tripod cane yesterday.  Pt brought her new cane into therapy.  Left walker in waiting room with her friend after asking therapist's permission.   Currently in Pain? No/denies                       [facility] Adult PT Treatment/Exercise - 02/14/15 1153    Ambulation/Gait   Ambulation/Gait Yes   Ambulation/Gait Assistance  4: Min guard   Ambulation/Gait Assistance Details Additional 400 ft of gait indoor surfaces with cognitive tasks, with decreased step length and decreased foot clearance noted   Ambulation Distance (Feet) 400 Feet  x 2, then outside gait x 400 ft   Assistive device --  tripod cane   Gait Pattern Decreased step length - right;Decreased step length - left;Wide base of support  occasional decreased foot clearance RLE added cognitive task   Ambulation Surface Level;Indoor;Outdoor;Paved   High Level Balance   High Level Balance Activities Side stepping;Braiding;Backward walking;Tandem walking;Marching forwards;Marching backwards  crossover steps forward/back on red mat surface, min assist   High Level Balance Comments on red mat surface-feet apart, semi-tandem gait with head turns, head nods x 5 reps each with supervision     Mat surface activities performed 1-3 reps of 15 ft each with min assist.  Pt requires cue for widened BOS on mat surface.  At counter-heel walking, toe walking, 2 reps x 10 ft with UE support, heel walking with knees flexed x 10 ft., heel/toe raises 20 reps at counter.  Discussed/practiced abdominal activation/setting with neutral spine with standing activities when patient is putting on makeup due to pt's concerns of low back sensation occurring.  Discussed pt not over-arching low back area by activating abdominal muscles.  PT Education - 02/14/15 1200    Education provided Yes   Education Details Instructed pt with progression of tripod base cane, to use tripod outdoors with supervision only; use RW if no supervision and if pt is tired/fatigued   Person(s) Educated Patient   Methods Explanation;Verbal cues;Demonstration   Comprehension Verbalized understanding;Returned demonstration          PT Short Term Goals - 01/23/15 1724    PT SHORT TERM GOAL #1   Title Pt will be independent with HEP for improved strength, balance, and gait.   Time 4   Period  Weeks   Status New   PT SHORT TERM GOAL #2   Title Pt will improve  TUG score to less than or equal to 15 seconds for decreased fall risk.   Time 4   Period Weeks   Status New   PT SHORT TERM GOAL #3   Title Pt will improve Berg Balance score to at least 31/56 for decreased fall risk.   Time 4   Period Weeks   Status New   PT SHORT TERM GOAL #4   Title Pt will perform at least 8 of 10 reps of sit<>stand transfers with minimal UE support, to demonstrate improved lower extremity strength.   Time 4   Period Weeks   Status New           PT Long Term Goals - 01/23/15 1726    PT LONG TERM GOAL #1   Title Pt will verbalize understanding of fall prevention techniques within the home environment.   Time 8   Period Weeks   Status New   PT LONG TERM GOAL #2   Title Pt will improve Timed UP and Go score to less than or equal to 13.5 seconds for decreased fall risk.   Time 8   Period Weeks   Status New   PT LONG TERM GOAL #3   Title Pt will improve Berg Balance score to at least 41/56 for decreased fall risk.   Time 8   Period Weeks   Status New   PT LONG TERM GOAL #4   Title Pt will ambulate at least 200 ft. using cane or no device, modified independently, for improved functional mobility.   Time 8   Period Weeks   Status New   PT LONG TERM GOAL #5   Title Pt will verbalize understanding of transition to community fitness upon D/C from PT.   Time 8   Period Weeks   Status New               Plan - 02/14/15 1202    Clinical Impression Statement Pt noted to have decreased step length with cognitive tasks; however, pt has no loss of balance noted during gait with tripod base cane.  Pt is demonstrating significant functional improvement, but continues to have difficulty with higher level  compliant surfaces.  Pt would continue to benefit from further skilled PT to address balance, gait and progression with functional mobility activities.   Pt will benefit from skilled  therapeutic intervention in order to improve on the following deficits Abnormal gait;Decreased balance;Decreased coordination;Decreased mobility;Decreased strength;Difficulty walking   Rehab Potential Good   PT Frequency 3x / week   PT Duration 8 weeks   PT Treatment/Interventions ADLs/Self Care Home Management;Functional mobility training;Stair training;Gait training;DME Instruction;Therapeutic activities;Therapeutic exercise;Balance training;Neuromuscular re-education;Patient/family education   PT Next Visit Plan Outdoor gait, compliant surface activities, gait with tripod cane with cognitive tasks and environmental  scanning-GCODE NEXT VISIT   Consulted and Agree with Plan of Care Patient        Problem List Patient Active Problem List   Diagnosis Date Noted  . Anxiety state 12/10/2014  . Neurogenic bladder 11/30/2014  . Neurogenic bowel 11/30/2014  . Bacterial UTI 11/30/2014  . Thoracic myelopathy 11/29/2014  . Persistent atrial fibrillation 11/27/2014  . Traumatic spinal subdural hematoma   . Acute pulmonary embolism   . Orthostatic hypotension 11/22/2014  . Near syncope 11/21/2014  . Paraplegia at T9 level 11/17/2014  . Weakness of both legs   . Nocturnal leg cramps 07/16/2014  . Encounter for therapeutic drug monitoring 12/25/2013  . Malaise and fatigue 06/01/2011  . Atrial flutter 03/10/2011  . Encounter for long-term (current) use of anticoagulants 03/10/2011  . Benign hypertensive heart disease without heart failure 03/10/2011  . Hypercholesterolemia 03/10/2011  . Paroxysmal atrial flutter   . Palpitations   . PAC (premature atrial contraction)   . PVC's (premature ventricular contractions)   . Malaise   . Fatigue   . Myalgia     Kynedi Profitt W. 02/14/2015, 12:05 PM  Lonia Blood, PT 02/14/2015 12:08 PM Phone: [phone] Fax: [phone]   [facility] [address] [place], [place],  [address] Phone: [phone]   Fax:  [phone]

## 2015-02-15 ENCOUNTER — Telehealth: Payer: Self-pay | Admitting: Internal Medicine

## 2015-02-15 ENCOUNTER — Encounter: Payer: Medicare Other | Admitting: Internal Medicine

## 2015-02-15 ENCOUNTER — Ambulatory Visit: Payer: Medicare Other | Admitting: Physical Therapy

## 2015-02-15 DIAGNOSIS — R269 Unspecified abnormalities of gait and mobility: Secondary | ICD-10-CM

## 2015-02-15 DIAGNOSIS — M6281 Muscle weakness (generalized): Secondary | ICD-10-CM

## 2015-02-15 DIAGNOSIS — R6889 Other general symptoms and signs: Secondary | ICD-10-CM

## 2015-02-15 NOTE — Telephone Encounter (Signed)
Pt c/o swelling: STAT is pt has developed SOB within 24 hours  1. How long have you been experiencing swelling? Since 01/22/15  2. Where is the swelling located? Legs, ankles, and feet  3.  Are you currently taking a "fluid pill"? Digoxin and bp meds  4.  Are you currently SOB? No  5.  Have you traveled recently? No   Pt's friend calling stating that pt is due to see Dr. Tenny Crawoss in April and doesn't think she can wait that long with the amount of swelling the pt is having. States that pt had an echo done on Monday and the results should have already been sent to Dr. Tenny Crawoss. Wanting to find out if they can see Dr. Tenny Crawoss any sooner or if Dr. Tenny Crawoss can give them any recommendations. Please call back and advise.

## 2015-02-18 ENCOUNTER — Ambulatory Visit: Payer: Medicare Other | Admitting: Physical Therapy

## 2015-02-18 ENCOUNTER — Ambulatory Visit (INDEPENDENT_AMBULATORY_CARE_PROVIDER_SITE_OTHER): Payer: Medicare Other | Admitting: Internal Medicine

## 2015-02-18 ENCOUNTER — Encounter: Payer: Self-pay | Admitting: Internal Medicine

## 2015-02-18 VITALS — Ht 66.0 in | Wt 117.0 lb

## 2015-02-18 DIAGNOSIS — R002 Palpitations: Secondary | ICD-10-CM

## 2015-02-18 DIAGNOSIS — M6281 Muscle weakness (generalized): Secondary | ICD-10-CM

## 2015-02-18 DIAGNOSIS — I480 Paroxysmal atrial fibrillation: Secondary | ICD-10-CM

## 2015-02-18 DIAGNOSIS — R269 Unspecified abnormalities of gait and mobility: Secondary | ICD-10-CM | POA: Diagnosis not present

## 2015-02-18 LAB — CBC
HCT: 37.8 % (ref 36.0–46.0)
Hemoglobin: 12.5 g/dL (ref 12.0–15.0)
MCHC: 33.1 g/dL (ref 30.0–36.0)
MCV: 92.3 fl (ref 78.0–100.0)
Platelets: 171 10*3/uL (ref 150.0–400.0)
RBC: 4.09 Mil/uL (ref 3.87–5.11)
RDW: 13.4 % (ref 11.5–15.5)
WBC: 4.9 10*3/uL (ref 4.0–10.5)

## 2015-02-18 LAB — BASIC METABOLIC PANEL
BUN: 15 mg/dL (ref 6–23)
CO2: 30 mEq/L (ref 19–32)
Calcium: 9.3 mg/dL (ref 8.4–10.5)
Chloride: 105 mEq/L (ref 96–112)
Creatinine, Ser: 0.6 mg/dL (ref 0.40–1.20)
GFR: 101.9 mL/min (ref >60.00)
Glucose, Bld: 91 mg/dL (ref 70–99)
Potassium: 4.1 mEq/L (ref 3.5–5.1)
Sodium: 142 mEq/L (ref 135–145)

## 2015-02-18 LAB — BRAIN NATRIURETIC PEPTIDE: Pro B Natriuretic peptide (BNP): 275 pg/mL — ABNORMAL HIGH (ref 0.0–100.0)

## 2015-02-18 NOTE — Progress Notes (Signed)
Cardiology Office Note   Date:  02/19/2015   ID:  [name] Weber, DOB 07-22-1931, MRN [id number]  PCP:  Pearla Dubonnet, MD  Cardiologist:   Dietrich Pates, MD   Chief Complaint  Patient presents with  . RE-VISIT   Patient previously followed by Wylene Simmer  Comes back for continued care     History of Present Illness: [name] Weber is a 81 y.o. female with a history of.PAF, hyperlipidemia   She was previously followed by Wylene Simmer   She was admitted in Dec 2015 with back pain and inability to walk  Found to have a   Underwent surgical decmpression Seen by cardiology during admit  In/out of atrial flutter  Started on florinef due to orthostatic hypotension.   She was discharged from rehab and has since gone home  Still having therapy She denies palpitations.  No CP  Does complain of LE edema that is wrose.  Denies SOB  No dizziness  Current Outpatient Prescriptions  Medication Sig Dispense Refill  . alendronate (FOSAMAX) 35 MG tablet Take 35 mg by mouth every 7 (seven) days. Take with a full glass of water on an empty stomach. Sunday    . aspirin EC 81 MG EC tablet Take 1 tablet (81 mg total) by mouth daily.    Marland Kitchen atorvastatin (LIPITOR) 10 MG tablet Take 10 mg by mouth daily.    . digoxin (LANOXIN) 0.125 MG tablet Take 1 tablet (0.125 mg total) by mouth daily. 30 tablet 1  . tamsulosin (FLOMAX) 0.4 MG CAPS capsule Take 2 capsules (0.8 mg total) by mouth daily after supper. 30 capsule 3   No current facility-administered medications for this visit.    Allergies:   Crestor   Past Medical History  Diagnosis Date  . Paroxysmal atrial flutter   . Palpitations     OCCASSIONAL  . PAC (premature atrial contraction)     ISOLATED  . PVC's (premature ventricular contractions)     ISOLATED  . Malaise   . Fatigue   . Myalgia     Past Surgical History  Procedure Laterality Date  . Vulvar lesion removal  01/01/2009    She had a 1-cm area of erythema to the right of  the urethral meatus  . Thoracic laminectomy for epidural abscess N/A 11/16/2014    Procedure: THORACIC LAMINECTOMY FOR EPIDURAL ABSCESS;  Surgeon: Karn Cassis, MD;  Location: [facility];  Service: Neurosurgery;  Laterality: N/A;     Social History:  The patient  reports that she quit smoking about 23 years ago. She does not have any smokeless tobacco history on file. She reports that she does not drink alcohol or use illicit drugs.   Family History:  The patient's family history includes Hyperlipidemia in her mother.    ROS:  Please see the history of present illness. All other systems are reviewed and  Negative to the above problem except as noted.    PHYSICAL EXAM: VS:  Ht  (1.676 m)  Wt 117 lb (53.071 kg)  BMI 18.89 kg/m2  GEN: Well nourished, well developed, in no acute distress HEENT: normal Neck: no JVD, carotid bruits, or masses Cardiac: RRR; no murmurs, rubs, or gallops,1+ edema  Respiratory:  clear to auscultation bilaterally, normal work of breathing GI: soft, nontender, nondistended, + BS  No hepatomegaly  MS: no deformity Moving all extremities   Skin: warm and dry, no rash Neuro:  Strength and sensation are intact Psych: euthymic mood,  full affect   EKG:  EKG is not ordered today.   Lipid Panel    Component Value Date/Time   CHOL 230* 02/18/2012 0937   TRIG 50.0 02/18/2012 0937   HDL 87.90 02/18/2012 0937   CHOLHDL 3 02/18/2012 0937   VLDL 10.0 02/18/2012 0937   LDLDIRECT 126.0 02/18/2012 0937      Wt Readings from Last 3 Encounters:  02/18/15 117 lb (53.071 kg)  01/02/15 108 lb 6.4 oz (49.17 kg)  12/12/14 96 lb 9 oz (43.8 kg)      ASSESSMENT AND PLAN:  1.  Edema  Mild volume increase on exam  I would recomm stopping florinef and follow  Consider use of dilt but would keep on the same for now.  Check labs.  Will bein touch with patient re results    2.  PAF  Not on ASA given above problem  WIll review     Current medicines are  reviewed at length with the patient today.  The patient does not have concerns regarding medicines.  The following changes have been made:   Labs/ tests ordered today include:  Orders Placed This Encounter  Procedures  . Basic Metabolic Panel (BMET)  . B Nat Peptide  . CBC  . Holter monitor - 24 hour     Disposition:   FU with  in   Signed, Dietrich PatesPaula Jozlynn Plaia, MD  02/19/2015 11:39 PM    [facility] [address], [place], [place]  [address] Phone: [phone]; Fax: [phone]

## 2015-02-18 NOTE — Progress Notes (Signed)
2D Echo completed. 02/18/2015 

## 2015-02-18 NOTE — Therapy (Signed)
[facility] [address] [place], [place], [address] Phone: [phone]   Fax:  [phone]  Physical Therapy Treatment  Patient Details  Name: [name] Weber MRN: [id number] Date of Birth: 1931/11/20 Referring Provider:  [name], MD  Encounter Date: 02/15/2015      PT End of Session - 02/18/15 0726    Visit Number 10   Number of Visits 25   Date for PT Re-Evaluation 03/24/15   Authorization Type Medicare G-code every 10th visit   PT Start Time 0803   PT Stop Time 0846   PT Time Calculation (min) 43 min   Equipment Utilized During Treatment Gait belt   Activity Tolerance Patient tolerated treatment well      Past Medical History  Diagnosis Date  . Paroxysmal atrial flutter   . Palpitations     OCCASSIONAL  . PAC (premature atrial contraction)     ISOLATED  . PVC's (premature ventricular contractions)     ISOLATED  . Malaise   . Fatigue   . Myalgia     Past Surgical History  Procedure Laterality Date  . Vulvar lesion removal  01/01/2009    She had a 1-cm area of erythema to the right of the urethral meatus  . Thoracic laminectomy for epidural abscess N/A 11/16/2014    Procedure: THORACIC LAMINECTOMY FOR EPIDURAL ABSCESS;  Surgeon: Karn Cassis, MD;  Location: [facility];  Service: Neurosurgery;  Laterality: N/A;    There were no vitals filed for this visit.  Visit Diagnosis:  Abnormality of gait  Muscle weakness of lower extremity  Postural instability of trunk                     [facility] Adult PT Treatment/Exercise - 02/18/15 0001    Transfers   Transfers Sit to Stand;Stand to Sit   Sit to Stand Without upper extremity assist;From elevated surface;5: Supervision  from 27", 25", 23" 10 reps each, from 20",18" 5 reps each   Exercises   Exercises Lumbar;Ankle   Lumbar Exercises: Quadruped   Straight Leg Raise 10 reps;5 seconds   Straight Leg Raises Limitations  cues for abdominal activation and neutral spine   Opposite Arm/Leg Raise 5 reps;5 seconds;Right arm/Left leg;Left arm/Right leg   Opposite Arm/Leg Raise Limitations min guard assistance for R hip    Ankle Exercises: Seated   Other Seated Ankle Exercises seated ankle golf activity fx 6 minutes for ankle ROM and proprioception      Neuro Re-education:  On compliant mat surface no device:  Forward/back walking, marching forward/back, tandem gait, tandem march, side stepping, side step marching, crossover steps and braiding with min assist, 3 reps x 15 ft each direction.  Gait: Gait training with tripod cane, 220 ft x 3 reps with conversation tasks in gym area, then gait with quick turns and maneuvering through and around furniture with tripod cane with min guard>supervision.  No loss of balance noted.            PT Short Term Goals - 01/23/15 1724    PT SHORT TERM GOAL #1   Title Pt will be independent with HEP for improved strength, balance, and gait.   Time 4   Period Weeks   Status New   PT SHORT TERM GOAL #2   Title Pt will improve  TUG score to less than or equal to 15 seconds for decreased fall risk.   Time 4   Period  Weeks   Status New   PT SHORT TERM GOAL #3   Title Pt will improve Berg Balance score to at least 31/56 for decreased fall risk.   Time 4   Period Weeks   Status New   PT SHORT TERM GOAL #4   Title Pt will perform at least 8 of 10 reps of sit<>stand transfers with minimal UE support, to demonstrate improved lower extremity strength.   Time 4   Period Weeks   Status New           PT Long Term Goals - 01/23/15 1726    PT LONG TERM GOAL #1   Title Pt will verbalize understanding of fall prevention techniques within the home environment.   Time 8   Period Weeks   Status New   PT LONG TERM GOAL #2   Title Pt will improve Timed UP and Go score to less than or equal to 13.5 seconds for decreased fall risk.   Time 8   Period Weeks   Status New   PT  LONG TERM GOAL #3   Title Pt will improve Berg Balance score to at least 41/56 for decreased fall risk.   Time 8   Period Weeks   Status New   PT LONG TERM GOAL #4   Title Pt will ambulate at least 200 ft. using cane or no device, modified independently, for improved functional mobility.   Time 8   Period Weeks   Status New   PT LONG TERM GOAL #5   Title Pt will verbalize understanding of transition to community fitness upon D/C from PT.   Time 8   Period Weeks   Status New               Plan - 02/18/15 0726    Pt will benefit from skilled therapeutic intervention in order to improve on the following deficits Abnormal gait;Decreased balance;Decreased coordination;Decreased mobility;Decreased strength;Difficulty walking   Rehab Potential Good   PT Frequency 3x / week   PT Duration 8 weeks   PT Treatment/Interventions ADLs/Self Care Home Management;Functional mobility training;Stair training;Gait training;DME Instruction;Therapeutic activities;Therapeutic exercise;Balance training;Neuromuscular re-education;Patient/family education   PT Next Visit Plan Check STGs and add visits   Consulted and Agree with Plan of Care Patient        Problem List Patient Active Problem List   Diagnosis Date Noted  . Anxiety state 12/10/2014  . Neurogenic bladder 11/30/2014  . Neurogenic bowel 11/30/2014  . Bacterial UTI 11/30/2014  . Thoracic myelopathy 11/29/2014  . Persistent atrial fibrillation 11/27/2014  . Traumatic spinal subdural hematoma   . Acute pulmonary embolism   . Orthostatic hypotension 11/22/2014  . Near syncope 11/21/2014  . Paraplegia at T9 level 11/17/2014  . Weakness of both legs   . Nocturnal leg cramps 07/16/2014  . Encounter for therapeutic drug monitoring 12/25/2013  . Malaise and fatigue 06/01/2011  . Atrial flutter 03/10/2011  . Encounter for long-term (current) use of anticoagulants 03/10/2011  . Benign hypertensive heart disease without heart failure  03/10/2011  . Hypercholesterolemia 03/10/2011  . Paroxysmal atrial flutter   . Palpitations   . PAC (premature atrial contraction)   . PVC's (premature ventricular contractions)   . Malaise   . Fatigue   . Myalgia     Marigold Mom W. for treatment provided 02/15/15 02/18/2015, 7:29 AM Lonia BloodAmy Nieve Rojero, PT 02/18/2015 7:32 AM Phone: [phone] Fax: [phone]  [facility] [address] [place], [place], [address]  Phone: [phone]   Fax:  [phone]

## 2015-02-18 NOTE — Patient Instructions (Signed)
Your physician has recommended you make the following change in your medication:  1.) STOP FLORINEF  Your physician recommends that you return for lab work in: TODAY (CBC, BMET, BNP)  Your physician has recommended that you wear a holter monitor. Holter monitors are medical devices that record the heart's electrical activity. Doctors most often use these monitors to diagnose arrhythmias. Arrhythmias are problems with the speed or rhythm of the heartbeat. The monitor is a small, portable device. You can wear one while you do your normal daily activities. This is usually used to diagnose what is causing palpitations/syncope (passing out).  Your physician recommends that you schedule a follow-up appointment in: 4 WEEKS WITH DR ROSS.

## 2015-02-18 NOTE — Therapy (Signed)
[facility] [address] [place], [place], [address] Phone: [phone]   Fax:  [phone]  Physical Therapy Treatment  Patient Details  Name: [name] Weber MRN: [id number] Date of Birth: 13-Jun-1931 Referring Provider:  Chesley Noon, MD  Encounter Date: 02/18/2015      PT End of Session - 02/18/15 1722    Visit Number 11   Number of Visits 25   Date for PT Re-Evaluation 03/24/15   Authorization Type Medicare G-code every 10th visit   PT Start Time 1410   PT Stop Time 1449   PT Time Calculation (min) 39 min   Equipment Utilized During Treatment Gait belt   Activity Tolerance Patient tolerated treatment well   Behavior During Therapy [facility] for tasks assessed/performed      Past Medical History  Diagnosis Date  . Paroxysmal atrial flutter   . Palpitations     OCCASSIONAL  . PAC (premature atrial contraction)     ISOLATED  . PVC's (premature ventricular contractions)     ISOLATED  . Malaise   . Fatigue   . Myalgia     Past Surgical History  Procedure Laterality Date  . Vulvar lesion removal  01/01/2009    She had a 1-cm area of erythema to the right of the urethral meatus  . Thoracic laminectomy for epidural abscess N/A 11/16/2014    Procedure: THORACIC LAMINECTOMY FOR EPIDURAL ABSCESS;  Surgeon: Floyce Stakes, MD;  Location: [place] [facility];  Service: Neurosurgery;  Laterality: N/A;    There were no vitals filed for this visit.  Visit Diagnosis:  Abnormality of gait  Muscle weakness of lower extremity      Subjective Assessment - 02/18/15 1718    Symptoms Went to cardiologist today.  Took some bloodwork; may want me to come off one of the medications that might be causing me to retain fluid.   Currently in Pain? No/denies            [facility] PT Assessment - 02/18/15 1720    Standardized Balance Assessment   Standardized Balance Assessment Dynamic Gait Index   Berg Balance Test   Sit  to Stand Able to stand  independently using hands   Standing Unsupported Able to stand safely 2 minutes   Sitting with Back Unsupported but Feet Supported on Floor or Stool Able to sit safely and securely 2 minutes   Stand to Sit Controls descent by using hands   Transfers Able to transfer safely, definite need of hands   Standing Unsupported with Eyes Closed Able to stand 10 seconds safely   Standing Ubsupported with Feet Together Able to place feet together independently and stand 1 minute safely   From Standing, Reach Forward with Outstretched Arm Can reach forward >12 cm safely (5")  9"   From Standing Position, Pick up Object from Floor Unable to try/needs assist to keep balance  pt has not gotten word on precautions being lifted   From Standing Position, Turn to Look Behind Over each Shoulder Turn sideways only but maintains balance   Turn 360 Degrees Able to turn 360 degrees safely but slowly   Standing Unsupported, Alternately Place Feet on Step/Stool Able to complete >2 steps/needs minimal assist   Standing Unsupported, One Foot in Front Able to take small step independently and hold 30 seconds   Standing on One Leg Tries to lift leg/unable to hold 3 seconds but remains standing independently   Total Score 36  Dynamic Gait Index   Level Surface Mild Impairment   Change in Gait Speed Mild Impairment   Gait with Horizontal Head Turns Mild Impairment   Gait with Vertical Head Turns Mild Impairment   Gait and Pivot Turn Mild Impairment  wide BOS, incr. trunk sway   Step Over Obstacle Moderate Impairment   Step Around Obstacles Mild Impairment   Steps Moderate Impairment   Total Score 14     Timed Up and Go score:  14.25 sec with tripod cane; 12.66 sec with no device              [facility] Adult PT Treatment/Exercise - 02/18/15 1718    Transfers   Transfers Sit to Stand;Stand to Sit   Sit to Stand With upper extremity assist;From chair/3-in-1;6: Modified independent  (Device/Increase time);From elevated surface  10 times min UE assist   Stand to Sit 6: Modified independent (Device/Increase time);With upper extremity assist;To chair/3-in-1;To elevated surface       Sidelying R hip abduction with straight leg x 10 reps with 2# weight; sidelying hip abduction in clamshell position with 2# ankle weight, 2 sets x 10 reps.  Discussed proper positioning and technique for slow, controlled motions during exercises at home.           PT Education - 02/18/15 1721    Education provided Yes   Education Details progress towards goals, continuing with POC, 3x/wk for 4 weeks, to address further strength and balance and progression to independent gait   Person(s) Educated Patient   Methods Explanation;Verbal cues   Comprehension Verbalized understanding          PT Short Term Goals - 02/18/15 1722    PT SHORT TERM GOAL #1   Title Pt will be independent with HEP for improved strength, balance, and gait.   Status Achieved   PT SHORT TERM GOAL #2   Title Pt will improve  TUG score to less than or equal to 15 seconds for decreased fall risk.   Baseline 14.28 sec with tripod cane; 12.66 sec with no device   Status Achieved   PT SHORT TERM GOAL #3   Title Pt will improve Berg Balance score to at least 31/56 for decreased fall risk.   Baseline 36/56 on 02/18/15   Status Achieved   PT SHORT TERM GOAL #4   Title Pt will perform at least 8 of 10 reps of sit<>stand transfers with minimal UE support, to demonstrate improved lower extremity strength.   Status Achieved           PT Long Term Goals - 01/23/15 1726    PT LONG TERM GOAL #1   Title Pt will verbalize understanding of fall prevention techniques within the home environment.   Time 8   Period Weeks   Status New   PT LONG TERM GOAL #2   Title Pt will improve Timed UP and Go score to less than or equal to 13.5 seconds for decreased fall risk.   Time 8   Period Weeks   Status New   PT LONG TERM  GOAL #3   Title Pt will improve Berg Balance score to at least 41/56 for decreased fall risk.   Time 8   Period Weeks   Status New   PT LONG TERM GOAL #4   Title Pt will ambulate at least 200 ft. using cane or no device, modified independently, for improved functional mobility.   Time 8   Period Weeks  Status New   PT LONG TERM GOAL #5   Title Pt will verbalize understanding of transition to community fitness upon D/C from PT.   Time 8   Period Weeks   Status New               Plan - 02/18/15 1724    Clinical Impression Statement Pt has met all STGs.  Pt has made nice progress in functional mobility in the past 4 weeks. Pt does continue to demonstrate decreased strength (3+/5 to 4/5) in R hip abductors, which is noted with decreased single limb stance ability.  Pt also noted to have decreased step length and decreased heelstrike with attempts of TUG with no device.  Pt will continue to benefit from further skilled PT  to further address strength, balance and gait progression.   Pt will benefit from skilled therapeutic intervention in order to improve on the following deficits Abnormal gait;Decreased balance;Decreased coordination;Decreased mobility;Decreased strength;Difficulty walking   Rehab Potential Good   PT Frequency 3x / week   PT Duration 8 weeks  this is week 4 of 8   PT Treatment/Interventions ADLs/Self Care Home Management;Functional mobility training;Stair training;Gait training;DME Instruction;Therapeutic activities;Therapeutic exercise;Balance training;Neuromuscular re-education;Patient/family education   PT Next Visit Plan Work on sidelying hip abduction with weights then standing hip abduction, single limb stance activities   Consulted and Agree with Plan of Care Patient        Problem List Patient Active Problem List   Diagnosis Date Noted  . Anxiety state 12/10/2014  . Neurogenic bladder 11/30/2014  . Neurogenic bowel 11/30/2014  . Bacterial UTI  11/30/2014  . Thoracic myelopathy 11/29/2014  . Persistent atrial fibrillation 11/27/2014  . Traumatic spinal subdural hematoma   . Acute pulmonary embolism   . Orthostatic hypotension 11/22/2014  . Near syncope 11/21/2014  . Paraplegia at T9 level 11/17/2014  . Weakness of both legs   . Nocturnal leg cramps 07/16/2014  . Encounter for therapeutic drug monitoring 12/25/2013  . Malaise and fatigue 06/01/2011  . Atrial flutter 03/10/2011  . Encounter for long-term (current) use of anticoagulants 03/10/2011  . Benign hypertensive heart disease without heart failure 03/10/2011  . Hypercholesterolemia 03/10/2011  . Paroxysmal atrial flutter   . Palpitations   . PAC (premature atrial contraction)   . PVC's (premature ventricular contractions)   . Malaise   . Fatigue   . Myalgia     Hensley Treat W. 02/18/2015, 5:28 PM  Mady Haagensen, PT 02/18/2015 5:31 PM Phone: [phone] Fax: [facility] [address] [place], [place], [address] Phone: [phone]   Fax:  [phone]

## 2015-02-18 NOTE — Telephone Encounter (Signed)
Pt seen by Dr. Tenny Crawoss today.

## 2015-02-19 ENCOUNTER — Ambulatory Visit: Payer: Medicare Other | Admitting: Physical Therapy

## 2015-02-19 ENCOUNTER — Telehealth: Payer: Self-pay | Admitting: Radiology

## 2015-02-19 ENCOUNTER — Encounter: Payer: Self-pay | Admitting: Physical Therapy

## 2015-02-19 ENCOUNTER — Encounter: Payer: Self-pay | Admitting: Radiology

## 2015-02-19 ENCOUNTER — Encounter (INDEPENDENT_AMBULATORY_CARE_PROVIDER_SITE_OTHER): Payer: Medicare Other

## 2015-02-19 DIAGNOSIS — I480 Paroxysmal atrial fibrillation: Secondary | ICD-10-CM

## 2015-02-19 DIAGNOSIS — R269 Unspecified abnormalities of gait and mobility: Secondary | ICD-10-CM | POA: Diagnosis not present

## 2015-02-19 DIAGNOSIS — R002 Palpitations: Secondary | ICD-10-CM | POA: Diagnosis not present

## 2015-02-19 DIAGNOSIS — M6281 Muscle weakness (generalized): Secondary | ICD-10-CM

## 2015-02-19 NOTE — Progress Notes (Signed)
Occupational Therapy Discharge Summary  Patient Details  Name: [name] Weber MRN: [id number] Date of Birth: [birth year]   Patient has met 5 of 7 long term goals due to improved activity tolerance, improved balance and ability to compensate for deficits.  Patient to discharge at overall Min-Assist- Supervision level.  Patient's care partner is unable to provide the necessary physical assistance at discharge to provide intermittent min-assist and supervision and setup during ADL, therefore pt discharged to SNF for continued care.   Pt requires 24/7 supervision for safety due to unresolved cognitive impairment.  Reasons goals not met: Continues to require intermittent steadying assist while standing to dress.  Recommendation:  Patient will benefit from ongoing skilled OT services at SNF or in outpatient setting to continue to advance functional skills in the area of BADL and iADL.  Equipment: No equipment provided  Reasons for discharge: discharge from hospital  Patient/family agrees with progress made and goals achieved: Yes  OT Discharge Precautions/Restrictions  Precautions Precautions: Fall;Back Precaution Booklet Issued: No Precaution Comments: watch orthostatic BP Restrictions Weight Bearing Restrictions: No General   Vital Signs   Pain Pain Assessment Pain Assessment: No/denies pain ADL ADL ADL Comments: see FIM Vision/Perception  Vision- History Baseline Vision/History: No visual deficits Patient Visual Report: No change from baseline Vision- Assessment Vision Assessment?: No apparent visual deficits Eye Alignment: Within Functional Limits  Cognition Overall Cognitive Status: Impaired/Different from baseline Arousal/Alertness: Awake/alert Orientation Level: Oriented X4 Attention: Sustained Sustained Attention: Appears intact Memory: Impaired Memory Impairment: Decreased recall of new information Awareness: Appears intact Awareness Impairment: Anticipatory  impairment Problem Solving: Impaired Problem Solving Impairment: Functional complex Safety/Judgment: Appears intact Comments: Recommend 24/7 S for safety due to decreased memory and cues for safety needed. Cues at times for maintaining  Sensation Sensation Light Touch: Appears Intact Stereognosis: Appears Intact Hot/Cold: Appears Intact Proprioception: Appears Intact Additional Comments: Reports slight decrease on top of thighs bilaterally but reports overall improvement since admission Coordination Gross Motor Movements are Fluid and Coordinated: Yes (WFL @ [facility]) Fine Motor Movements are Fluid and Coordinated: Yes (WFL @ [facility]) Motor  Motor Motor: Ataxia;Other (comment) Motor - Discharge Observations: improved strength and contol in trunk and LE's since admission Mobility     Trunk/Postural Assessment  Cervical Assessment Cervical Assessment: Within Functional Limits Thoracic Assessment Thoracic Assessment: Within Functional Limits Lumbar Assessment Lumbar Assessment: Exceptions to [facility] (impaired d/t back precautions) Postural Control Postural Control: Deficits on evaluation Protective Responses: decreased in standing without UE support Postural Limitations: decreased due to precautions and in standing without UE support  Balance Static Sitting Balance Static Sitting - Level of Assistance: 6: Modified independent (Device/Increase time) Dynamic Sitting Balance Dynamic Sitting - Level of Assistance: 6: Modified independent (Device/Increase time) Static Standing Balance Static Standing - Balance Support: During functional activity;Bilateral upper extremity supported Static Standing - Level of Assistance: 5: Stand by assistance Dynamic Standing Balance Dynamic Standing - Level of Assistance: 5: Stand by assistance Extremity/Trunk Assessment RUE Assessment RUE Assessment: Within Functional Limits LUE Assessment LUE Assessment: Within Functional Limits  See FIM for current  functional status  Tali Coster 02/19/2015, 11:55 AM

## 2015-02-19 NOTE — Telephone Encounter (Signed)
To date:  Patient remains OFF of oral anticoagulation.   Current meds:  Digoxin, Tamsulosin, Atorvastatin, Alendronate, Aspirin 81 mg.   Has appointment w/ Dr Johnella MoloneyNeville [name] on 03/06/2015 for new patient visit.  Currently in outpatient PT 3 times/week at Dr Cassandria SanteeBotero's office.  Patient states that she prefers that IVC filter remain in place.  Dr. Archer AsaMcCullough notified.    Marquelle Musgrave Carmell AustriaGales, [place] 02/19/2015 4:08 PM

## 2015-02-19 NOTE — Telephone Encounter (Signed)
Left message requesting patient call for status update re:  Anticoagulation/IVC filter.  Anina Schnake Carmell AustriaGales, RN 02/19/2015 3:12 PM

## 2015-02-19 NOTE — Therapy (Signed)
[facility] [address] [place], [place], [address] Phone: [phone]   Fax:  [phone]  Physical Therapy Treatment  Patient Details  Name: [name] Weber MRN: [id number] Date of Birth: 09-22-1931 Referring Provider:  [name], MD  Encounter Date: 02/19/2015      PT End of Session - 02/19/15 1109    Visit Number 12   Number of Visits 25   Date for PT Re-Evaluation 03/24/15   Authorization Type Medicare G-code every 10th visit   PT Start Time 1104   PT Stop Time 1145   PT Time Calculation (min) 41 min   Equipment Utilized During Treatment Gait belt   Activity Tolerance Patient tolerated treatment well   Behavior During Therapy [facility] for tasks assessed/performed      Past Medical History  Diagnosis Date  . Paroxysmal atrial flutter   . Palpitations     OCCASSIONAL  . PAC (premature atrial contraction)     ISOLATED  . PVC's (premature ventricular contractions)     ISOLATED  . Malaise   . Fatigue   . Myalgia     Past Surgical History  Procedure Laterality Date  . Vulvar lesion removal  01/01/2009    She had a 1-cm area of erythema to the right of the urethral meatus  . Thoracic laminectomy for epidural abscess N/A 11/16/2014    Procedure: THORACIC LAMINECTOMY FOR EPIDURAL ABSCESS;  Surgeon: Karn Cassis, MD;  Location: [facility];  Service: Neurosurgery;  Laterality: N/A;    There were no vitals filed for this visit.  Visit Diagnosis:  Muscle weakness of lower extremity      Subjective Assessment - 02/19/15 1108    Symptoms Had cardiac monitor put on this am at cardiologist office, wearing it for 24 hours. No pain or falls to report.   Currently in Pain? No/denies   Pain Score 0-No pain     Treatment: Exercise On mat-  side lying - Hip abduction with 2# ankle weight and foot in neutral position 5 sec hold x 10 reps - hip abduction with IR 5 sec hold x 10 reps - hip  abduction with ER 5 sec hold x 10 reps - clamshells with red theraband resistance 5 sec hold x 10 reps  Quadruped - alternating leg outs x 10 each side with cues/assist for pelvic stability with exercise - glut kick backs x 10 each side with cues on ex form - "fire hydrant" x 10 each side with cues on ex form and pelvic stability with exercise   Tall kneeling - side "stepping" left/right with emphasis on tall posture and lift off with leg advancement x 3 laps each way at long edge of mat table  Standing with red theraband around legs above knees - side stepping left/right x 3 laps each way with cues on neutral leg position and pelvic position - side stepping in squat position x 3 laps each way - forward/backward diagonal steps in squat position x 3 laps each way    Neuro Re-ed Single leg stance activities - with tall cones: alternating forward toe taps, cross toe taps, forward double toe taps, and cross double toe taps x 10 each with min assist and cues to slow down and on weight shifting/stance stability before lifting opposite leg/foot. - right leg stance with left ball rolls forward/backward, circles both ways with baseball x 10 each. Cues on pelvic stability and right leg position/stability. Min assist needed. - right  stance with left toe taps to 3 foam bubbles in a row x 10 laps each way with min assist and cues to slow down and on right stance stability (not to rotate hip, roll ankle, knee stability).        PT Education - 02/18/15 1721    Education provided Yes   Education Details progress towards goals, continuing with POC, 3x/wk for 4 weeks, to address further strength and balance and progression to independent gait   Person(s) Educated Patient   Methods Explanation;Verbal cues   Comprehension Verbalized understanding          PT Short Term Goals - 02/18/15 1722    PT SHORT TERM GOAL #1   Title Pt will be independent with HEP for improved strength, balance, and gait.    Status Achieved   PT SHORT TERM GOAL #2   Title Pt will improve  TUG score to less than or equal to 15 seconds for decreased fall risk.   Baseline 14.28 sec with tripod cane; 12.66 sec with no device   Status Achieved   PT SHORT TERM GOAL #3   Title Pt will improve Berg Balance score to at least 31/56 for decreased fall risk.   Baseline 36/56 on 02/18/15   Status Achieved   PT SHORT TERM GOAL #4   Title Pt will perform at least 8 of 10 reps of sit<>stand transfers with minimal UE support, to demonstrate improved lower extremity strength.   Status Achieved           PT Long Term Goals - 01/23/15 1726    PT LONG TERM GOAL #1   Title Pt will verbalize understanding of fall prevention techniques within the home environment.   Time 8   Period Weeks   Status New   PT LONG TERM GOAL #2   Title Pt will improve Timed UP and Go score to less than or equal to 13.5 seconds for decreased fall risk.   Time 8   Period Weeks   Status New   PT LONG TERM GOAL #3   Title Pt will improve Berg Balance score to at least 41/56 for decreased fall risk.   Time 8   Period Weeks   Status New   PT LONG TERM GOAL #4   Title Pt will ambulate at least 200 ft. using cane or no device, modified independently, for improved functional mobility.   Time 8   Period Weeks   Status New   PT LONG TERM GOAL #5   Title Pt will verbalize understanding of transition to community fitness upon D/C from PT.   Time 8   Period Weeks   Status New           Plan - 02/19/15 1109    Clinical Impression Statement Focused on strenghening and single leg stance balance today. Needed cues for correct ex form and to slow down with balance activiites for increased control/balance.   Pt will benefit from skilled therapeutic intervention in order to improve on the following deficits Abnormal gait;Decreased balance;Decreased coordination;Decreased mobility;Decreased strength;Difficulty walking   Rehab Potential Good   PT  Frequency 3x / week   PT Duration 8 weeks  this is week 4 of 8   PT Treatment/Interventions ADLs/Self Care Home Management;Functional mobility training;Stair training;Gait training;DME Instruction;Therapeutic activities;Therapeutic exercise;Balance training;Neuromuscular re-education;Patient/family education   PT Next Visit Plan Work on sidelying hip abduction with weights then standing hip abduction, single limb stance activities   Consulted and Agree with  Plan of Care Patient        Problem List Patient Active Problem List   Diagnosis Date Noted  . Anxiety state 12/10/2014  . Neurogenic bladder 11/30/2014  . Neurogenic bowel 11/30/2014  . Bacterial UTI 11/30/2014  . Thoracic myelopathy 11/29/2014  . Persistent atrial fibrillation 11/27/2014  . Traumatic spinal subdural hematoma   . Acute pulmonary embolism   . Orthostatic hypotension 11/22/2014  . Near syncope 11/21/2014  . Paraplegia at T9 level 11/17/2014  . Weakness of both legs   . Nocturnal leg cramps 07/16/2014  . Encounter for therapeutic drug monitoring 12/25/2013  . Malaise and fatigue 06/01/2011  . Atrial flutter 03/10/2011  . Encounter for long-term (current) use of anticoagulants 03/10/2011  . Benign hypertensive heart disease without heart failure 03/10/2011  . Hypercholesterolemia 03/10/2011  . Paroxysmal atrial flutter   . Palpitations   . PAC (premature atrial contraction)   . PVC's (premature ventricular contractions)   . Malaise   . Fatigue   . Myalgia     Sallyanne Kuster 02/19/2015, 12:16 PM  Sallyanne Kuster, PTA, [facility] Outpatient Neuro [facility] [address] [place], [place] [address] [phone] 02/19/2015, 12:30 PM

## 2015-02-19 NOTE — Progress Notes (Signed)
Patient ID: [name] Weber, female   DOB: 01/05/1931, 81 y.o.   MRN: [id number] [employer] 24hr holter applied.

## 2015-02-20 ENCOUNTER — Encounter: Payer: Self-pay | Admitting: Physical Therapy

## 2015-02-20 ENCOUNTER — Ambulatory Visit: Payer: Medicare Other | Admitting: Physical Therapy

## 2015-02-20 ENCOUNTER — Telehealth: Payer: Self-pay | Admitting: Radiology

## 2015-02-20 DIAGNOSIS — M6281 Muscle weakness (generalized): Secondary | ICD-10-CM

## 2015-02-20 DIAGNOSIS — R269 Unspecified abnormalities of gait and mobility: Secondary | ICD-10-CM

## 2015-02-20 NOTE — Progress Notes (Signed)
Phoned patient to share instructions per Dr Archer AsaMcCullough (see telephone encounter of 02/19/2015):    Dr Archer AsaMcCullough added an Addendum to IVC filter report that this is a permanent device.  No further follow up.    Patient is satisfied and has been instructed to call should she have any future questions/concerns.    Canio Winokur Carmell AustriaGales, RN 02/20/2015 11:53 AM

## 2015-02-20 NOTE — Therapy (Signed)
[facility] [address] [place], [place], [address] Phone: [phone]   Fax:  [phone]  Physical Therapy Treatment  Patient Details  Name: [name] Weber MRN: [id number] Date of Birth: 05-20-1931 Referring Provider:  [name], MD  Encounter Date: 02/20/2015      PT End of Session - 02/20/15 1409    Visit Number 13   Number of Visits 25   Date for PT Re-Evaluation 03/24/15   Authorization Type Medicare G-code every 10th visit   PT Start Time 1402   PT Stop Time 1444   PT Time Calculation (min) 42 min   Equipment Utilized During Treatment Gait belt   Activity Tolerance Patient tolerated treatment well   Behavior During Therapy [facility] for tasks assessed/performed      Past Medical History  Diagnosis Date  . Paroxysmal atrial flutter   . Palpitations     OCCASSIONAL  . PAC (premature atrial contraction)     ISOLATED  . PVC's (premature ventricular contractions)     ISOLATED  . Malaise   . Fatigue   . Myalgia     Past Surgical History  Procedure Laterality Date  . Vulvar lesion removal  01/01/2009    She had a 1-cm area of erythema to the right of the urethral meatus  . Thoracic laminectomy for epidural abscess N/A 11/16/2014    Procedure: THORACIC LAMINECTOMY FOR EPIDURAL ABSCESS;  Surgeon: Karn Cassis, MD;  Location: [facility];  Service: Neurosurgery;  Laterality: N/A;    There were no vitals filed for this visit.  Visit Diagnosis:  Muscle weakness of lower extremity  Abnormality of gait      Subjective Assessment - 02/20/15 1407    Symptoms Turned in cardiac monitor this am, hoping to have results soon (office is comparing it to a ECG they did 2 weeks ago).  No pain or falls to report.   Currently in Pain? No/denies   Pain Score 0-No pain     Treatment: Gait - with cane gait on uneven paved surfaces/indoor level surfaces combined for 1000 feet with supervision.  Occasional cues for foot clearance.   Exercise Hook lying with red band around knees- cues on pelvic stability with bridge and movement - bridge with hip abduct/ER 5 sec hold x 10 reps - bridge with alternating marches x 10 each side  side lying - Hip abduction with 2# ankle weight and foot in neutral position 5 sec hold x 10 reps - clamshells with red theraband resistance 5 sec hold x 15 reps  Quadruped - alternating leg outs x 10 each side with cues/assist for pelvic stability with exercise. Ruler used for feedback on pelvic stability. - glut kick backs x 10 each side with cues on ex form - "fire hydrant" x 10 each side with cues on ex form and pelvic stability with exercise   Tall kneeling - side "stepping" left/right with emphasis on tall posture and lift off with leg advancement, red theraband resistance tied around legs above knees,  Performed x 2 laps each way at long edge of mat table  Standing with red theraband around legs above knees: all of these with min HHA for balance - side stepping left/right x 3 laps each way with cues on neutral leg position and pelvic position - side stepping in squat position x 3 laps each way  - feet apart in squat position forward/backward walking x 3 laps each way  PT Short Term Goals - 02/18/15 1722    PT SHORT TERM GOAL #1   Title Pt will be independent with HEP for improved strength, balance, and gait.   Status Achieved   PT SHORT TERM GOAL #2   Title Pt will improve  TUG score to less than or equal to 15 seconds for decreased fall risk.   Baseline 14.28 sec with tripod cane; 12.66 sec with no device   Status Achieved   PT SHORT TERM GOAL #3   Title Pt will improve Berg Balance score to at least 31/56 for decreased fall risk.   Baseline 36/56 on 02/18/15   Status Achieved   PT SHORT TERM GOAL #4   Title Pt will perform at least 8 of 10 reps of sit<>stand transfers with minimal UE support, to demonstrate improved lower  extremity strength.   Status Achieved           PT Long Term Goals - 01/23/15 1726    PT LONG TERM GOAL #1   Title Pt will verbalize understanding of fall prevention techniques within the home environment.   Time 8   Period Weeks   Status New   PT LONG TERM GOAL #2   Title Pt will improve Timed UP and Go score to less than or equal to 13.5 seconds for decreased fall risk.   Time 8   Period Weeks   Status New   PT LONG TERM GOAL #3   Title Pt will improve Berg Balance score to at least 41/56 for decreased fall risk.   Time 8   Period Weeks   Status New   PT LONG TERM GOAL #4   Title Pt will ambulate at least 200 ft. using cane or no device, modified independently, for improved functional mobility.   Time 8   Period Weeks   Status New   PT LONG TERM GOAL #5   Title Pt will verbalize understanding of transition to community fitness upon D/C from PT.   Time 8   Period Weeks   Status New           Plan - 02/20/15 1409    Clinical Impression Statement Pt making steady progress toward goals. No issues reported with advancement of exercises today.   Pt will benefit from skilled therapeutic intervention in order to improve on the following deficits Abnormal gait;Decreased balance;Decreased coordination;Decreased mobility;Decreased strength;Difficulty walking   Rehab Potential Good   PT Frequency 3x / week   PT Duration 8 weeks  this is week 4 of 8   PT Treatment/Interventions ADLs/Self Care Home Management;Functional mobility training;Stair training;Gait training;DME Instruction;Therapeutic activities;Therapeutic exercise;Balance training;Neuromuscular re-education;Patient/family education   PT Next Visit Plan continue to work on hip strengthening, balance (especially single limb stance), gait   Consulted and Agree with Plan of Care Patient        Problem List Patient Active Problem List   Diagnosis Date Noted  . Anxiety state 12/10/2014  . Neurogenic bladder  11/30/2014  . Neurogenic bowel 11/30/2014  . Bacterial UTI 11/30/2014  . Thoracic myelopathy 11/29/2014  . Persistent atrial fibrillation 11/27/2014  . Traumatic spinal subdural hematoma   . Acute pulmonary embolism   . Orthostatic hypotension 11/22/2014  . Near syncope 11/21/2014  . Paraplegia at T9 level 11/17/2014  . Weakness of both legs   . Nocturnal leg cramps 07/16/2014  . Encounter for therapeutic drug monitoring 12/25/2013  . Malaise and fatigue 06/01/2011  . Atrial flutter 03/10/2011  . Encounter for  long-term (current) use of anticoagulants 03/10/2011  . Benign hypertensive heart disease without heart failure 03/10/2011  . Hypercholesterolemia 03/10/2011  . Paroxysmal atrial flutter   . Palpitations   . PAC (premature atrial contraction)   . PVC's (premature ventricular contractions)   . Malaise   . Fatigue   . Myalgia     Sallyanne KusterBury, Jannis Atkins 02/21/2015, 1:07 PM  Sallyanne KusterKathy Rashaunda Rahl, PTA, [facility] Out[facility] [address], [address] [place], [place] [id number] [phone] 02/21/2015, 1:07 PM

## 2015-02-21 ENCOUNTER — Telehealth: Payer: Self-pay | Admitting: *Deleted

## 2015-02-21 ENCOUNTER — Ambulatory Visit: Payer: Medicare Other | Admitting: Physical Therapy

## 2015-02-21 MED ORDER — FUROSEMIDE 20 MG PO TABS: 20.0000 mg | ORAL_TABLET | ORAL | Status: DC

## 2015-02-21 MED ORDER — POTASSIUM CHLORIDE ER 10 MEQ PO TBCR: 10.0000 meq | EXTENDED_RELEASE_TABLET | ORAL | Status: DC

## 2015-02-21 NOTE — Telephone Encounter (Signed)
Informed patient. Requests medicines to [facility]. Will call in 2 weeks to update on swelling

## 2015-02-21 NOTE — Telephone Encounter (Signed)
-----   Message from Pricilla RifflePaula Ross V, MD sent at 02/20/2015 12:03 AM EDT ----- Electrolytes, CBC are normal Fluid is up mildly   She was told to stop florinef. She can try 20 Lasix with 10 mEq KCL every other day for fluid   Call with response in a couple wks

## 2015-02-25 ENCOUNTER — Ambulatory Visit: Payer: Medicare Other | Admitting: Physical Therapy

## 2015-02-25 DIAGNOSIS — R269 Unspecified abnormalities of gait and mobility: Secondary | ICD-10-CM | POA: Diagnosis not present

## 2015-02-25 DIAGNOSIS — M6281 Muscle weakness (generalized): Secondary | ICD-10-CM

## 2015-02-25 NOTE — Therapy (Signed)
[facility] [address] [place], [place], [address] Phone: [phone]   Fax:  [phone]  Physical Therapy Treatment  Patient Details  Name: [name] Weber MRN: [id number] Date of Birth: 06/13/1931 Referring Provider:  Eartha InchBadger, Michael C, MD  Encounter Date: 02/25/2015      PT End of Session - 02/25/15 1705    Visit Number 14   Number of Visits 25   Date for PT Re-Evaluation 03/24/15   Authorization Type Medicare G-code every 10th visit   PT Start Time 1404   PT Stop Time 1445   PT Time Calculation (min) 41 min   Equipment Utilized During Treatment Gait belt   Activity Tolerance Patient tolerated treatment well   Behavior During Therapy [facility] for tasks assessed/performed      Past Medical History  Diagnosis Date  . Paroxysmal atrial flutter   . Palpitations     OCCASSIONAL  . PAC (premature atrial contraction)     ISOLATED  . PVC's (premature ventricular contractions)     ISOLATED  . Malaise   . Fatigue   . Myalgia     Past Surgical History  Procedure Laterality Date  . Vulvar lesion removal  01/01/2009    She had a 1-cm area of erythema to the right of the urethral meatus  . Thoracic laminectomy for epidural abscess N/A 11/16/2014    Procedure: THORACIC LAMINECTOMY FOR EPIDURAL ABSCESS;  Surgeon: Karn CassisErnesto M Botero, MD;  Location: [facility];  Service: Neurosurgery;  Laterality: N/A;    There were no vitals filed for this visit.  Visit Diagnosis:  Muscle weakness of lower extremity  Abnormality of gait      Subjective Assessment - 02/25/15 1408    Symptoms Ankle swelling has gone down-having a "catch in my R hip " started yesterday.   Currently in Pain? Yes   Pain Score 2    Pain Location Hip   Pain Orientation Right   Pain Descriptors / Indicators Aching   Pain Type Acute pain   Pain Onset Yesterday   Pain Frequency Intermittent   Aggravating Factors  unsure   Pain Relieving Factors  heat/sitting      Therapeutic Ex:  Resisted sidestepping using green theraband 3 x 10 ft. R and L directions, with added squat component to sidestep for hip strengthening.  Forward/back squat walking with widened BOS with green theraband around thighs to work on widened BOS and decreased hip internal rotation.  At 6 inch step:  Forward step ups, 2 sets x 10 reps, then side step ups x 10 reps with cues for decreased reliance on UE support, forward step downs x 10 reps with bilateral UE support.   Neuro Re-education:  In parallel bars:  Single limb stance with controlled ball rolling, 4 sets x 10 reps; on foam balance beam:  Tandem stance with head turns then with eyes closed with UE support; marching in place, forward kicks, then forward step taps x 15 reps with 1 UE support; mini squats x 10 reps 2 sets, with cues for quad and glut activation upon full standing.  Gait:  Gait activities using tripod base cane x 345 ft with supervision, then stair negotiation 3 reps with railing with alternating step pattern.  Gait x 10 ft, then x 115 ft with no device, with min guard assistance, with decreased R step length and decreased R heelstrike noted.  PT Short Term Goals - 02/18/15 1722    PT SHORT TERM GOAL #1   Title Pt will be independent with HEP for improved strength, balance, and gait.   Status Achieved   PT SHORT TERM GOAL #2   Title Pt will improve  TUG score to less than or equal to 15 seconds for decreased fall risk.   Baseline 14.28 sec with tripod cane; 12.66 sec with no device   Status Achieved   PT SHORT TERM GOAL #3   Title Pt will improve Berg Balance score to at least 31/56 for decreased fall risk.   Baseline 36/56 on 02/18/15   Status Achieved   PT SHORT TERM GOAL #4   Title Pt will perform at least 8 of 10 reps of sit<>stand transfers with minimal UE support, to demonstrate improved lower extremity strength.   Status Achieved            PT Long Term Goals - 01/23/15 1726    PT LONG TERM GOAL #1   Title Pt will verbalize understanding of fall prevention techniques within the home environment.   Time 8   Period Weeks   Status New   PT LONG TERM GOAL #2   Title Pt will improve Timed UP and Go score to less than or equal to 13.5 seconds for decreased fall risk.   Time 8   Period Weeks   Status New   PT LONG TERM GOAL #3   Title Pt will improve Berg Balance score to at least 41/56 for decreased fall risk.   Time 8   Period Weeks   Status New   PT LONG TERM GOAL #4   Title Pt will ambulate at least 200 ft. using cane or no device, modified independently, for improved functional mobility.   Time 8   Period Weeks   Status New   PT LONG TERM GOAL #5   Title Pt will verbalize understanding of transition to community fitness upon D/C from PT.   Time 8   Period Weeks   Status New               Plan - 02/25/15 1706    Clinical Impression Statement Pt is progressing well towards goals.  She is more confident and appears more steady with cane.  During gait with cane and stair exercises, pt does not appear to have as much internal rotation at R hip as in visits past.  Pt will continue to benefit from further skilled PT to address strength, balance and gait to progress to full, independent functional mobility.   Pt will benefit from skilled therapeutic intervention in order to improve on the following deficits Abnormal gait;Decreased balance;Decreased coordination;Decreased mobility;Decreased strength;Difficulty walking   Rehab Potential Good   PT Frequency 3x / week   PT Duration 8 weeks  this is wk 5 of 8   PT Treatment/Interventions ADLs/Self Care Home Management;Functional mobility training;Stair training;Gait training;DME Instruction;Therapeutic activities;Therapeutic exercise;Balance training;Neuromuscular re-education;Patient/family education   PT Next Visit Plan continue to work on hip strengthening, balance  (especially single limb stance), gait progression to no device for short distances   Consulted and Agree with Plan of Care Patient        Problem List Patient Active Problem List   Diagnosis Date Noted  . Anxiety state 12/10/2014  . Neurogenic bladder 11/30/2014  . Neurogenic bowel 11/30/2014  . Bacterial UTI 11/30/2014  . Thoracic myelopathy 11/29/2014  . Persistent atrial fibrillation 11/27/2014  . Traumatic spinal  subdural hematoma   . Acute pulmonary embolism   . Orthostatic hypotension 11/22/2014  . Near syncope 11/21/2014  . Paraplegia at T9 level 11/17/2014  . Weakness of both legs   . Nocturnal leg cramps 07/16/2014  . Encounter for therapeutic drug monitoring 12/25/2013  . Malaise and fatigue 06/01/2011  . Atrial flutter 03/10/2011  . Encounter for long-term (current) use of anticoagulants 03/10/2011  . Benign hypertensive heart disease without heart failure 03/10/2011  . Hypercholesterolemia 03/10/2011  . Paroxysmal atrial flutter   . Palpitations   . PAC (premature atrial contraction)   . PVC's (premature ventricular contractions)   . Malaise   . Fatigue   . Myalgia     Lexii Walsh W. 02/25/2015, 5:09 PM  Lonia Blood, PT 02/25/2015 5:10 PM Phone: [phone] Fax: [phone]  [facility] [address] [place], [place], [address] Phone: [phone]   Fax:  [phone]

## 2015-02-26 ENCOUNTER — Other Ambulatory Visit: Payer: Self-pay | Admitting: Adult Health

## 2015-02-27 ENCOUNTER — Telehealth: Payer: Self-pay | Admitting: *Deleted

## 2015-02-27 ENCOUNTER — Ambulatory Visit: Payer: Medicare Other | Admitting: Physical Therapy

## 2015-02-27 DIAGNOSIS — R269 Unspecified abnormalities of gait and mobility: Secondary | ICD-10-CM

## 2015-02-27 DIAGNOSIS — M6281 Muscle weakness (generalized): Secondary | ICD-10-CM

## 2015-02-27 NOTE — Telephone Encounter (Signed)
Received holter end of service report. Shows patient in afib 98.92% of the time. Pt taking asa 81 mg daily.  Reviewed with Dr. Anne FuSkains (DOD) who states this is a chronic situation and ok to be left for when Dr. Tenny Crawoss returns next week. Pt has appointment to follow up Dr. Tenny Crawoss on 03/25/15.

## 2015-02-27 NOTE — Therapy (Signed)
[facility] [address] [place], [place], [address] Phone: [phone]   Fax:  [phone]  Physical Therapy Treatment  Patient Details  Name: [name] Weber MRN: [id number] Date of Birth: 1931/11/01 Referring Provider:  [name], MD  Encounter Date: 02/27/2015      PT End of Session - 02/27/15 1738    Visit Number 15   Number of Visits 25   Date for PT Re-Evaluation 03/24/15   Authorization Type Medicare G-code every 10th visit   PT Start Time 1146   PT Stop Time 1230   PT Time Calculation (min) 44 min   Activity Tolerance Patient tolerated treatment well   Behavior During Therapy [facility] for tasks assessed/performed      Past Medical History  Diagnosis Date  . Paroxysmal atrial flutter   . Palpitations     OCCASSIONAL  . PAC (premature atrial contraction)     ISOLATED  . PVC's (premature ventricular contractions)     ISOLATED  . Malaise   . Fatigue   . Myalgia     Past Surgical History  Procedure Laterality Date  . Vulvar lesion removal  01/01/2009    She had a 1-cm area of erythema to the right of the urethral meatus  . Thoracic laminectomy for epidural abscess N/A 11/16/2014    Procedure: THORACIC LAMINECTOMY FOR EPIDURAL ABSCESS;  Surgeon: Karn Cassis, MD;  Location: [facility];  Service: Neurosurgery;  Laterality: N/A;    There were no vitals filed for this visit.  Visit Diagnosis:  Muscle weakness of lower extremity  Abnormality of gait      Subjective Assessment - 02/27/15 1151    Symptoms Still having a "catch" in R hip at times, on and off during the day.   Currently in Pain? Yes   Pain Score 3    Pain Location Hip  PT will attempt to address with stretching exercises   Pain Orientation Right   Pain Descriptors / Indicators --  aggravating, "catch"   Pain Type Acute pain   Aggravating Factors  sitting too long   Pain Relieving Factors lying down         Therapeutic Ex: -Based on pt's complaints of "catch" in anterior aspect of R hip, attempted hip stretching-unable to replicate pain during PT session.  Pt performs supine hip flexor stretch off edge of mat 3 x 30 seconds, hip external/internal rotation stretch 2 reps x 10 seconds, then standing hip flexor stretch x 30 seconds. -Sidelying hip abduction 2# weight on R side 2 x 10 reps, then clamshell hip abduction with red theraband 2 x 10 reps.  PT provides cues for hip/lower extremity positioning to prevent compensation with hip rotation or with hip flexion. -Forward step ups 2 sets x 10, side step ups 2 sets x 10, forward step downs x 10 reps, with cues for hip and lower extremity in neutral position.  Resisted sidestepping using green theraband 3 x 10 ft. R and L directions, with added squat component to sidestep for hip strengthening  (Pt experiences posterior loss of balance with sidestep resisted squats, requiring therapist assistance to regain balance, x 3 reps).  Cues provided for forward trunk lean to offset posterior loss of balance. Forward/back squat walking with widened BOS with green theraband around thighs to work on widened BOS and decreased hip internal rotation.   Neuro Re-education: In parallel bars: Single limb stance with controlled ball rolling, 3sets x 10 reps; on  foam balance beam: Step over obstacle forward alternating lower extremities,then side alternating lower extremities x 10 reps each.                        PT Short Term Goals - 02/18/15 1722    PT SHORT TERM GOAL #1   Title Pt will be independent with HEP for improved strength, balance, and gait.   Status Achieved   PT SHORT TERM GOAL #2   Title Pt will improve  TUG score to less than or equal to 15 seconds for decreased fall risk.   Baseline 14.28 sec with tripod cane; 12.66 sec with no device   Status Achieved   PT SHORT TERM GOAL #3   Title Pt will improve Berg Balance score to at least  31/56 for decreased fall risk.   Baseline 36/56 on 02/18/15   Status Achieved   PT SHORT TERM GOAL #4   Title Pt will perform at least 8 of 10 reps of sit<>stand transfers with minimal UE support, to demonstrate improved lower extremity strength.   Status Achieved           PT Long Term Goals - 01/23/15 1726    PT LONG TERM GOAL #1   Title Pt will verbalize understanding of fall prevention techniques within the home environment.   Time 8   Period Weeks   Status New   PT LONG TERM GOAL #2   Title Pt will improve Timed UP and Go score to less than or equal to 13.5 seconds for decreased fall risk.   Time 8   Period Weeks   Status New   PT LONG TERM GOAL #3   Title Pt will improve Berg Balance score to at least 41/56 for decreased fall risk.   Time 8   Period Weeks   Status New   PT LONG TERM GOAL #4   Title Pt will ambulate at least 200 ft. using cane or no device, modified independently, for improved functional mobility.   Time 8   Period Weeks   Status New   PT LONG TERM GOAL #5   Title Pt will verbalize understanding of transition to community fitness upon D/C from PT.   Time 8   Period Weeks   Status New               Plan - 02/27/15 1738    Clinical Impression Statement PT not able to replicate R hip pain during PT session.  Will continue to monitor and progress towards goals, towards independent and safe functional mobility.   Pt will benefit from skilled therapeutic intervention in order to improve on the following deficits Abnormal gait;Decreased balance;Decreased coordination;Decreased mobility;Decreased strength;Difficulty walking   Rehab Potential Good   PT Frequency 3x / week   PT Duration 8 weeks  wk 5 of 8   PT Next Visit Plan continue to work on hip strengthening, balance (especially single limb stance), gait progression to no device for short distances   Consulted and Agree with Plan of Care Patient        Problem List Patient Active Problem  List   Diagnosis Date Noted  . Anxiety state 12/10/2014  . Neurogenic bladder 11/30/2014  . Neurogenic bowel 11/30/2014  . Bacterial UTI 11/30/2014  . Thoracic myelopathy 11/29/2014  . Persistent atrial fibrillation 11/27/2014  . Traumatic spinal subdural hematoma   . Acute pulmonary embolism   . Orthostatic hypotension 11/22/2014  . Near syncope  11/21/2014  . Paraplegia at T9 level 11/17/2014  . Weakness of both legs   . Nocturnal leg cramps 07/16/2014  . Encounter for therapeutic drug monitoring 12/25/2013  . Malaise and fatigue 06/01/2011  . Atrial flutter 03/10/2011  . Encounter for long-term (current) use of anticoagulants 03/10/2011  . Benign hypertensive heart disease without heart failure 03/10/2011  . Hypercholesterolemia 03/10/2011  . Paroxysmal atrial flutter   . Palpitations   . PAC (premature atrial contraction)   . PVC's (premature ventricular contractions)   . Malaise   . Fatigue   . Myalgia     MARRIOTT,AMY W. 02/27/2015, 5:40 PM  Lonia Bloodmy Marriott, PT 02/27/2015 5:41 PM Phone: [phone] Fax: [phone]   [facility] [address] [place], [place], [address] Phone: [phone]   Fax:  [phone]

## 2015-02-28 ENCOUNTER — Ambulatory Visit: Payer: Medicare Other | Admitting: Physical Therapy

## 2015-02-28 DIAGNOSIS — R269 Unspecified abnormalities of gait and mobility: Secondary | ICD-10-CM | POA: Diagnosis not present

## 2015-02-28 DIAGNOSIS — M6281 Muscle weakness (generalized): Secondary | ICD-10-CM

## 2015-02-28 NOTE — Therapy (Signed)
[facility] [address] [place], [place], [address] Phone: [phone]   Fax:  [phone]  Physical Therapy Treatment  Patient Details  Name: [name] Weber MRN: [id number] Date of Birth: 06/27/1931 Referring Provider:  [name], MD  Encounter Date: 02/28/2015      PT End of Session - 02/28/15 1303    Visit Number 16   Number of Visits 25   Date for PT Re-Evaluation 03/24/15   Authorization Type Medicare G-code every 10th visit   PT Start Time 1102   PT Stop Time 1148   PT Time Calculation (min) 46 min   Equipment Utilized During Treatment Gait belt   Activity Tolerance Patient tolerated treatment well   Behavior During Therapy [facility] for tasks assessed/performed      Past Medical History  Diagnosis Date  . Paroxysmal atrial flutter   . Palpitations     OCCASSIONAL  . PAC (premature atrial contraction)     ISOLATED  . PVC's (premature ventricular contractions)     ISOLATED  . Malaise   . Fatigue   . Myalgia     Past Surgical History  Procedure Laterality Date  . Vulvar lesion removal  01/01/2009    She had a 1-cm area of erythema to the right of the urethral meatus  . Thoracic laminectomy for epidural abscess N/A 11/16/2014    Procedure: THORACIC LAMINECTOMY FOR EPIDURAL ABSCESS;  Surgeon: Karn Cassis, MD;  Location: [facility];  Service: Neurosurgery;  Laterality: N/A;    There were no vitals filed for this visit.  Visit Diagnosis:  Muscle weakness of lower extremity  Abnormality of gait      Subjective Assessment - 02/28/15 1103    Symptoms Removed pillows from under my legs and not having the hip pain today   Currently in Pain? No/denies                       [facility] Adult PT Treatment/Exercise - 02/28/15 1202    Exercises   Exercises Knee/Hip   Knee/Hip Exercises: Standing   Lateral Step Up Right;2 sets;10 reps;Hand Hold: 2;Step Height: 6"  LLE 1 set of  10   Forward Step Up Right;2 sets;10 reps;Hand Hold: 2;Step Height: 6"  LLE 1 set of 10   Step Down Right;1 set;10 reps;Hand Hold: 2;Step Height: 6"   Knee/Hip Exercises: Supine   Other Supine Knee Exercises hip/knee control off edge of mat 2 sets x 10 with cues on dorsiflexion  2# weight   Knee/Hip Exercises: Sidelying   Hip ABduction Strengthening;Right;2 sets;10 reps  2# weight, cues for positioning of hip   Clams red theraband 2 sets x 10 reps    Quadruped leg kicks x 10 reps, then alternating arm/leg kicks x 10 reps, with cues for improved hip stability on RLE.  NeuroRe-education:  Single limb standing activities without UE support with min guard/assist assistance-alternating step taps to cones, then to balance discs with varied sequence, with several episodes of posterior lean.  Pt requires cues to widen BOS.  Forward stepping over obstacle x 10 reps, then side stepping over obstacle 10 reps with no UE support and min assistance.  Corner balance exercises to address pt's posterior lean and decreased ability to independently regain balance.   Heel/toe raises x 20 reps for improved ankle strategy; wall bumps x 10 reps (added to HEP) for improved hip strategy.  Standing on foam in corner with chair for  UE support-head turns x 5, head nods x 5, eyes closed x 10 seconds with feet apart, then with feet together with min guard assistance of PT.          PT Education - 02/28/15 1302    Education provided Yes   Education Details heel/toe raises for ankle strategy/wall bumps for hip strategy for balance added to HEP   Person(s) Educated Patient   Methods Explanation;Demonstration;Handout   Comprehension Verbalized understanding;Returned demonstration          PT Short Term Goals - 02/18/15 1722    PT SHORT TERM GOAL #1   Title Pt will be independent with HEP for improved strength, balance, and gait.   Status Achieved   PT SHORT TERM GOAL #2   Title Pt will improve  TUG score to less  than or equal to 15 seconds for decreased fall risk.   Baseline 14.28 sec with tripod cane; 12.66 sec with no device   Status Achieved   PT SHORT TERM GOAL #3   Title Pt will improve Berg Balance score to at least 31/56 for decreased fall risk.   Baseline 36/56 on 02/18/15   Status Achieved   PT SHORT TERM GOAL #4   Title Pt will perform at least 8 of 10 reps of sit<>stand transfers with minimal UE support, to demonstrate improved lower extremity strength.   Status Achieved           PT Long Term Goals - 01/23/15 1726    PT LONG TERM GOAL #1   Title Pt will verbalize understanding of fall prevention techniques within the home environment.   Time 8   Period Weeks   Status New   PT LONG TERM GOAL #2   Title Pt will improve Timed UP and Go score to less than or equal to 13.5 seconds for decreased fall risk.   Time 8   Period Weeks   Status New   PT LONG TERM GOAL #3   Title Pt will improve Berg Balance score to at least 41/56 for decreased fall risk.   Time 8   Period Weeks   Status New   PT LONG TERM GOAL #4   Title Pt will ambulate at least 200 ft. using cane or no device, modified independently, for improved functional mobility.   Time 8   Period Weeks   Status New   PT LONG TERM GOAL #5   Title Pt will verbalize understanding of transition to community fitness upon D/C from PT.   Time 8   Period Weeks   Status New               Plan - 02/28/15 1305    Clinical Impression Statement No complaints of hip pain today.  Pt does have additional episodes of posterior loss of balance during single limb stance activities.  Began to address this through corner balance exercises focusing on vestibular system and on hip/ankle stratregy work.   Pt will benefit from skilled therapeutic intervention in order to improve on the following deficits Abnormal gait;Decreased balance;Decreased coordination;Decreased mobility;Decreased strength;Difficulty walking   Rehab Potential Good    PT Frequency 3x / week   PT Duration 8 weeks  wk 5 of 8   PT Treatment/Interventions ADLs/Self Care Home Management;Functional mobility training;Stair training;Gait training;DME Instruction;Therapeutic activities;Therapeutic exercise;Balance training;Neuromuscular re-education;Patient/family education   PT Next Visit Plan hip strengthening, single limb stance, review corner balance HEP; progress and continue to work on corner balance/compliant surface activities  Consulted and Agree with Plan of Care Patient        Problem List Patient Active Problem List   Diagnosis Date Noted  . Anxiety Weber 12/10/2014  . Neurogenic bladder 11/30/2014  . Neurogenic bowel 11/30/2014  . Bacterial UTI 11/30/2014  . Thoracic myelopathy 11/29/2014  . Persistent atrial fibrillation 11/27/2014  . Traumatic spinal subdural hematoma   . Acute pulmonary embolism   . Orthostatic hypotension 11/22/2014  . Near syncope 11/21/2014  . Paraplegia at T9 level 11/17/2014  . Weakness of both legs   . Nocturnal leg cramps 07/16/2014  . Encounter for therapeutic drug monitoring 12/25/2013  . Malaise and fatigue 06/01/2011  . Atrial flutter 03/10/2011  . Encounter for long-term (current) use of anticoagulants 03/10/2011  . Benign hypertensive heart disease without heart failure 03/10/2011  . Hypercholesterolemia 03/10/2011  . Paroxysmal atrial flutter   . Palpitations   . PAC (premature atrial contraction)   . PVC's (premature ventricular contractions)   . Malaise   . Fatigue   . Myalgia     Jalon Blackwelder W. 02/28/2015, 1:09 PM  Varian Innes Bernie Covey, PT 02/28/2015 1:10 PM Phone: [phone] Fax: [phone]   [facility] [address] [place], [place], [address] Phone: [phone]   Fax:  [phone]

## 2015-02-28 NOTE — Patient Instructions (Signed)
  DO THESE EXERCISES IN A CORNER WITH A CHAIR IN FRONT FOR SAFETY Weight Shift: Anterior / Posterior (Limits of Stability)   Slowly shift weight backward until toes begin to rise off floor. Return to starting position. Shift weight slowly forward until heels begin to rise off floor. Hold each position __3__ seconds. Repeat __2 sets of 10__ times per session. Do __1-2__ sessions per day. Copyright  VHI. All rights reserved.    Weight Shift: Anterior / Posterior (Righting / Equilibrium)   Slowly shift weight forward, arms back and hips forward over toes, until heels rise off floor. Return to starting position. Shift weight backward ("hinge at hips" to bump the wall gently), arms forward and hips back over heels, until toes rise off floor. Hold each position __3__ seconds.  Hold to chair if needed. Repeat _2 sets of 10___ times per session. Do _1-2___ sessions per day.  Copyright  VHI. All rights reserved.

## 2015-03-04 NOTE — Telephone Encounter (Signed)
Follow Up  Per pt neighbor- wanted to relay following info to Rn and Dr. Tenny Crawoss- Swelling has completely gone away. Should pt still be taking lasix or taking support stockings during the day? Please call back and discuss.

## 2015-03-04 NOTE — Telephone Encounter (Signed)
Discussed with Dr. Tenny Crawoss, who will contact patient.

## 2015-03-06 ENCOUNTER — Ambulatory Visit: Payer: Medicare Other | Attending: Physical Medicine & Rehabilitation | Admitting: Physical Therapy

## 2015-03-06 DIAGNOSIS — R269 Unspecified abnormalities of gait and mobility: Secondary | ICD-10-CM | POA: Insufficient documentation

## 2015-03-06 DIAGNOSIS — R6889 Other general symptoms and signs: Secondary | ICD-10-CM | POA: Diagnosis not present

## 2015-03-06 DIAGNOSIS — M6281 Muscle weakness (generalized): Secondary | ICD-10-CM

## 2015-03-06 NOTE — Telephone Encounter (Signed)
I would stop lasix and KCL Keep on med otherwise HOlter monitor shows she has good rate control for atrial fibrillation She is not a candidate for anticoagulation with bleed that she had. Keep on aspirin Keep follow up at end of month

## 2015-03-06 NOTE — Therapy (Signed)
[facility] [address] [place], [place], [address] Phone: [phone]   Fax:  [phone]  Physical Therapy Treatment  Patient Details  Name: [name] Weber MRN: [id number] Date of Birth: 10-28-1931 Referring Provider:  Marden Noble, MD  Encounter Date: 03/06/2015      PT End of Session - 03/06/15 0943    Visit Number 17   Number of Visits 25   Date for PT Re-Evaluation 03/24/15   Authorization Type Medicare G-code every 10th visit   PT Start Time 0850   PT Stop Time 0931   PT Time Calculation (min) 41 min   Equipment Utilized During Treatment Gait belt   Activity Tolerance Patient tolerated treatment well   Behavior During Therapy [facility] for tasks assessed/performed      Past Medical History  Diagnosis Date  . Paroxysmal atrial flutter   . Palpitations     OCCASSIONAL  . PAC (premature atrial contraction)     ISOLATED  . PVC's (premature ventricular contractions)     ISOLATED  . Malaise   . Fatigue   . Myalgia     Past Surgical History  Procedure Laterality Date  . Vulvar lesion removal  01/01/2009    She had a 1-cm area of erythema to the right of the urethral meatus  . Thoracic laminectomy for epidural abscess N/A 11/16/2014    Procedure: THORACIC LAMINECTOMY FOR EPIDURAL ABSCESS;  Surgeon: Karn Cassis, MD;  Location: [facility];  Service: Neurosurgery;  Laterality: N/A;    There were no vitals filed for this visit.  Visit Diagnosis:  Postural instability of trunk  Muscle weakness of lower extremity      Subjective Assessment - 03/06/15 0852    Subjective Having occasional pain in R posterior hip and R groin.  Did my exercises.   Currently in Pain? No/denies  Comes on at times   Pain Score 5   when pain hits, it's a 5-all of a sudden   Pain Location Hip   Pain Orientation Right   Pain Descriptors / Indicators Sharp   Pain Type Acute pain   Pain Frequency Intermittent   Aggravating Factors  Nothing in particular aggravates   Pain Relieving Factors stretches may help            Neuro Re-education:  Reviewed corner balance HEP with hip/ankle strategy x 20 reps.  Performed 20 reps of hip/ankle strategy also on compliant surface with UE support of chair.  On compliant surface-with feet apart, feet together with head turns, head nods, then eyes closed x 10 seconds with intermittent UE support.  At counter-compliant surface activities-marching in place, forward kicks, forward step taps, side step taps, back step taps x 10 reps each with 1 UE support.  On compliant beam-mini squats x 20 reps, then single limb stance 3 x 10 seconds each leg, tandem stance 3 x 10 seconds each position, then tandem walk forward and back with UE support and with min guard assistance.  Standing on rockerboard-anterior/posterior weightshifting x 30 reps for hip/ankle strategy work, balanced on rockerboard with UE lifts, then head turns x 10, head nods x 10, eyes closed x 10 seconds with min guard assistance.  Single limb stance activities-alternating step taps at balance disks 3 x 10 reps, then stepping over obstacle x 10 reps, with min assist and cues to decrease R hip internal rotation.  PT Short Term Goals - 02/18/15 1722    PT SHORT TERM GOAL #1   Title Pt will be independent with HEP for improved strength, balance, and gait.   Status Achieved   PT SHORT TERM GOAL #2   Title Pt will improve  TUG score to less than or equal to 15 seconds for decreased fall risk.   Baseline 14.28 sec with tripod cane; 12.66 sec with no device   Status Achieved   PT SHORT TERM GOAL #3   Title Pt will improve Berg Balance score to at least 31/56 for decreased fall risk.   Baseline 36/56 on 02/18/15   Status Achieved   PT SHORT TERM GOAL #4   Title Pt will perform at least 8 of 10 reps of sit<>stand transfers with minimal UE support, to demonstrate improved lower  extremity strength.   Status Achieved           PT Long Term Goals - 01/23/15 1726    PT LONG TERM GOAL #1   Title Pt will verbalize understanding of fall prevention techniques within the home environment.   Time 8   Period Weeks   Status New   PT LONG TERM GOAL #2   Title Pt will improve Timed UP and Go score to less than or equal to 13.5 seconds for decreased fall risk.   Time 8   Period Weeks   Status New   PT LONG TERM GOAL #3   Title Pt will improve Berg Balance score to at least 41/56 for decreased fall risk.   Time 8   Period Weeks   Status New   PT LONG TERM GOAL #4   Title Pt will ambulate at least 200 ft. using cane or no device, modified independently, for improved functional mobility.   Time 8   Period Weeks   Status New   PT LONG TERM GOAL #5   Title Pt will verbalize understanding of transition to community fitness upon D/C from PT.   Time 8   Period Weeks   Status New               Plan - 03/06/15 0943    Clinical Impression Statement Pt performs compliant surface vestibular system retaining exercises and single limb stance today with improved control and no posterior lean.  Pt will continue to benefit from further skilled PT to address gait, functional strength, balance for improved mobility and decreased fall risk.   Pt will benefit from skilled therapeutic intervention in order to improve on the following deficits Abnormal gait;Decreased balance;Decreased coordination;Decreased mobility;Decreased strength;Difficulty walking   Rehab Potential Good   PT Frequency 3x / week   PT Duration 8 weeks  wk 6 of 8   PT Treatment/Interventions ADLs/Self Care Home Management;Functional mobility training;Stair training;Gait training;DME Instruction;Therapeutic activities;Therapeutic exercise;Balance training;Neuromuscular re-education;Patient/family education   PT Next Visit Plan progress corner balance HEP; continue single limb stance and compliant surface  work; gait training on treadmill and on ground progressing to no device   Consulted and Agree with Plan of Care Patient        Problem List Patient Active Problem List   Diagnosis Date Noted  . Anxiety state 12/10/2014  . Neurogenic bladder 11/30/2014  . Neurogenic bowel 11/30/2014  . Bacterial UTI 11/30/2014  . Thoracic myelopathy 11/29/2014  . Persistent atrial fibrillation 11/27/2014  . Traumatic spinal subdural hematoma   . Acute pulmonary embolism   . Orthostatic hypotension 11/22/2014  . Near syncope 11/21/2014  .  Paraplegia at T9 level 11/17/2014  . Weakness of both legs   . Nocturnal leg cramps 07/16/2014  . Encounter for therapeutic drug monitoring 12/25/2013  . Malaise and fatigue 06/01/2011  . Atrial flutter 03/10/2011  . Encounter for long-term (current) use of anticoagulants 03/10/2011  . Benign hypertensive heart disease without heart failure 03/10/2011  . Hypercholesterolemia 03/10/2011  . Paroxysmal atrial flutter   . Palpitations   . PAC (premature atrial contraction)   . PVC's (premature ventricular contractions)   . Malaise   . Fatigue   . Myalgia     MARRIOTT,AMY W. 03/06/2015, 9:46 AM Lonia BloodAmy Marriott, PT 03/06/2015 10:13 AM Phone: [phone] Fax: [phone]  [facility] [address] [place], [place], [address] Phone: [phone]   Fax:  [phone]

## 2015-03-06 NOTE — Telephone Encounter (Signed)
Left message for patient to call office and ask for triage nurse 

## 2015-03-06 NOTE — Telephone Encounter (Signed)
Received call back from patient.  Reviewed Dr. Charlott Rakesoss's advice with patient who verbalized understanding and denies questions.  Patient states she is aware of her follow-up appointment with Dr. Tenny Crawoss on 4/25.  I advised patient to call back with questions or concerns.  Patient verbalized understanding and agreement.

## 2015-03-08 ENCOUNTER — Ambulatory Visit: Payer: Medicare Other | Admitting: Physical Therapy

## 2015-03-08 DIAGNOSIS — R269 Unspecified abnormalities of gait and mobility: Secondary | ICD-10-CM | POA: Diagnosis not present

## 2015-03-08 NOTE — Therapy (Signed)
[facility] [address] [place], [place], [address] Phone: [phone]   Fax:  [phone]  Physical Therapy Treatment  Patient Details  Name: [name] Weber MRN: [id number] Date of Birth: 08/08/1931 Referring Provider:  Marden NobleGates, Robert, MD  Encounter Date: 03/08/2015      PT End of Session - 03/08/15 1245    Visit Number 18   Number of Visits 25   Date for PT Re-Evaluation 03/24/15   Authorization Type Medicare G-code every 10th visit   PT Start Time 0850   PT Stop Time 0930   PT Time Calculation (min) 40 min   Equipment Utilized During Treatment Gait belt   Activity Tolerance Patient tolerated treatment well   Behavior During Therapy [facility] for tasks assessed/performed      Past Medical History  Diagnosis Date  . Paroxysmal atrial flutter   . Palpitations     OCCASSIONAL  . PAC (premature atrial contraction)     ISOLATED  . PVC's (premature ventricular contractions)     ISOLATED  . Malaise   . Fatigue   . Myalgia     Past Surgical History  Procedure Laterality Date  . Vulvar lesion removal  01/01/2009    She had a 1-cm area of erythema to the right of the urethral meatus  . Thoracic laminectomy for epidural abscess N/A 11/16/2014    Procedure: THORACIC LAMINECTOMY FOR EPIDURAL ABSCESS;  Surgeon: Karn CassisErnesto M Botero, MD;  Location: [facility];  Service: Neurosurgery;  Laterality: N/A;    There were no vitals filed for this visit.  Visit Diagnosis:  Abnormality of gait      Subjective Assessment - 03/08/15 0851    Subjective Have taken a few tylenol over the past few days and I haven't had any pain in my hip.  Doing more walking around the house without cane.   Currently in Pain? No/denies            Neuro Re-education:  Corner balance exercises on foam with feet apart, feet together, feet semi-tandem with eyes open head turns/nods x 10, then eyes closed head turns/nods x 10 with intermittent  UE support.  Added to HEP.  Standing on incline/decline of ramp for unlevel surface:  Marching in place x 10, forward step taps x 10, back step taps x 10 with min guard assistance.  Side stepping up and down incline, 3 reps each foot leading.  Single limb stance activities with alternating step taps to 6" then 12" step with 1 UE support.  Tandem gait with no UE support with min guard assistance x 20 ft.  Gait activities:  Stair negotiation x 3 reps, 4 steps with 1 handrail, with step to pattern, cues for decreasing internal rotation of bilateral knees.  Gait activities without cane around gym area, >500 ft, with environmental scanning, with ball toss and catch, and with UEs making ball circles with gait.  Pt demonstrated widened BOS and shorter step length with gait when not using cane.  Gait training holding bilateral walking pole with therapist helping to facilitate reciprocal arm swing and cues for increased step length>no walking poles with cues for arm swing and increased step length.  Pt responds to cueing well, taking longer steps with more consistent arm swing.  There Ex:  SciFit, Level 4, 4 extremities x 7 minutes, with cues for increased intensity >60 RPM, with cues for keeping knees and hips in neutral position.  PT Education - 03/08/15 1244    Education provided Yes   Education Details Corner balance exercises on foam   Person(s) Educated Patient   Methods Explanation;Demonstration;Handout   Comprehension Verbalized understanding;Returned demonstration          PT Short Term Goals - 02/18/15 1722    PT SHORT TERM GOAL #1   Title Pt will be independent with HEP for improved strength, balance, and gait.   Status Achieved   PT SHORT TERM GOAL #2   Title Pt will improve  TUG score to less than or equal to 15 seconds for decreased fall risk.   Baseline 14.28 sec with tripod cane; 12.66 sec with no device   Status Achieved   PT SHORT TERM GOAL #3    Title Pt will improve Berg Balance score to at least 31/56 for decreased fall risk.   Baseline 36/56 on 02/18/15   Status Achieved   PT SHORT TERM GOAL #4   Title Pt will perform at least 8 of 10 reps of sit<>stand transfers with minimal UE support, to demonstrate improved lower extremity strength.   Status Achieved           PT Long Term Goals - 01/23/15 1726    PT LONG TERM GOAL #1   Title Pt will verbalize understanding of fall prevention techniques within the home environment.   Time 8   Period Weeks   Status New   PT LONG TERM GOAL #2   Title Pt will improve Timed UP and Go score to less than or equal to 13.5 seconds for decreased fall risk.   Time 8   Period Weeks   Status New   PT LONG TERM GOAL #3   Title Pt will improve Berg Balance score to at least 41/56 for decreased fall risk.   Time 8   Period Weeks   Status New   PT LONG TERM GOAL #4   Title Pt will ambulate at least 200 ft. using cane or no device, modified independently, for improved functional mobility.   Time 8   Period Weeks   Status New   PT LONG TERM GOAL #5   Title Pt will verbalize understanding of transition to community fitness upon D/C from PT.   Time 8   Period Weeks   Status New               Plan - 03/08/15 1245    Pt will benefit from skilled therapeutic intervention in order to improve on the following deficits Abnormal gait;Decreased balance;Decreased coordination;Decreased mobility;Decreased strength;Difficulty walking   Rehab Potential Good   PT Frequency 3x / week   PT Duration 8 weeks  wk 6 of 8   PT Treatment/Interventions ADLs/Self Care Home Management;Functional mobility training;Stair training;Gait training;DME Instruction;Therapeutic activities;Therapeutic exercise;Balance training;Neuromuscular re-education;Patient/family education   PT Next Visit Plan treadmill gait, continuing gait without device; start checking goals/discussing d/c or renew/need to schedule more appts    Consulted and Agree with Plan of Care Patient        Problem List Patient Active Problem List   Diagnosis Date Noted  . Anxiety state 12/10/2014  . Neurogenic bladder 11/30/2014  . Neurogenic bowel 11/30/2014  . Bacterial UTI 11/30/2014  . Thoracic myelopathy 11/29/2014  . Persistent atrial fibrillation 11/27/2014  . Traumatic spinal subdural hematoma   . Acute pulmonary embolism   . Orthostatic hypotension 11/22/2014  . Near syncope 11/21/2014  . Paraplegia at T9 level 11/17/2014  . Weakness of both  legs   . Nocturnal leg cramps 07/16/2014  . Encounter for therapeutic drug monitoring 12/25/2013  . Malaise and fatigue 06/01/2011  . Atrial flutter 03/10/2011  . Encounter for long-term (current) use of anticoagulants 03/10/2011  . Benign hypertensive heart disease without heart failure 03/10/2011  . Hypercholesterolemia 03/10/2011  . Paroxysmal atrial flutter   . Palpitations   . PAC (premature atrial contraction)   . PVC's (premature ventricular contractions)   . Malaise   . Fatigue   . Myalgia     MARRIOTT,AMY W. 03/08/2015, 12:47 PM  Lonia Blood, PT 03/08/2015 12:47 PM Phone: [phone] Fax: [phone]   [facility] [address] [place], [place], [address] Phone: [phone]   Fax:  [phone]

## 2015-03-08 NOTE — Patient Instructions (Signed)
Feet Apart (Compliant Surface) Head Motion - Eyes Open   With eyes open, standing on compliant surface: _pillow or towel_______, feet shoulder width apart, move head slowly: up and down. Repeat __10__ times per session. Do _1-2___ sessions per day.  Repeat with moving head side to side.  Then repeat both with eyes closed.  Copyright  VHI. All rights reserved.  Feet Together (Compliant Surface) Head Motion - Eyes Closed   Stand on compliant surface: __pillow or towel_____ with feet together. Close eyes and move head slowly, up and down. Repeat __10__ times per session. Do __1-2__ sessions per day.  Then move head side to side.  Copyright  VHI. All rights reserved.  Feet Partial Heel-Toe (Compliant Surface) Head Motion - Eyes Closed   Stand on compliant surface: __pillow or towel______ with right foot partially in front of the other. Close eyes and move head slowly, up and down. Repeat __10__ times per session. Do _1-2___ sessions per day.  Repeat with head turns side to side.  Copyright  VHI. All rights reserved.

## 2015-03-11 ENCOUNTER — Ambulatory Visit: Payer: Medicare Other | Admitting: Physical Therapy

## 2015-03-11 DIAGNOSIS — R269 Unspecified abnormalities of gait and mobility: Secondary | ICD-10-CM

## 2015-03-11 DIAGNOSIS — M6281 Muscle weakness (generalized): Secondary | ICD-10-CM

## 2015-03-11 NOTE — Therapy (Signed)
[facility] [address] [place], [place], [address] Phone: [phone]   Fax:  [phone]  Physical Therapy Treatment  Patient Details  Name: Stephani Policevelyn C Steffensmeier MRN: [id number] Date of Birth: 04/08/1931 Referring Provider:  Marden NobleGates, Robert, MD  Encounter Date: 03/11/2015      PT End of Session - 03/11/15 1422    Visit Number 19   Number of Visits 25   Date for PT Re-Evaluation 03/24/15   Authorization Type Medicare G-code every 10th visit   PT Start Time 1155   PT Stop Time 1236   PT Time Calculation (min) 41 min   Equipment Utilized During Treatment Gait belt   Activity Tolerance Patient tolerated treatment well   Behavior During Therapy [facility] for tasks assessed/performed      Past Medical History  Diagnosis Date  . Paroxysmal atrial flutter   . Palpitations     OCCASSIONAL  . PAC (premature atrial contraction)     ISOLATED  . PVC's (premature ventricular contractions)     ISOLATED  . Malaise   . Fatigue   . Myalgia     Past Surgical History  Procedure Laterality Date  . Vulvar lesion removal  01/01/2009    She had a 1-cm area of erythema to the right of the urethral meatus  . Thoracic laminectomy for epidural abscess N/A 11/16/2014    Procedure: THORACIC LAMINECTOMY FOR EPIDURAL ABSCESS;  Surgeon: Karn CassisErnesto M Botero, MD;  Location: [facility];  Service: Neurosurgery;  Laterality: N/A;    There were no vitals filed for this visit.  Visit Diagnosis:  Abnormality of gait  Muscle weakness of lower extremity      Subjective Assessment - 03/11/15 1156    Subjective Tried the corner exercises, but almost fell-the pillow almost slipped out from under me.  Tried all the exercises in the corner without the pillow.   Currently in Pain? No/denies        Neuro Re-education:  Standing single limb stance forward step tap activities at 6 inch and 12 inch step, alternating lower extremities x 15 reps with 1  UE support.  Side step activities to 6 inch step x 15 reps each leg with 1 UE support.  Step up-up/down-down x 10 reps at 6 inch step, then at ramp x 10 reps each for fluid movement in dynamic positions.  Standing on ramp on incline/decline position with head turns, head nods x 10 reps each with eyes closed, then marching in place, forward step taps and backward step taps, 10 reps each with min guard assistance.  Therapeutic Ex:  Seated hamstring stretch, 5 x 30 seconds each leg, with foot propped on floor to attempt to address decreased full knee extension in stance phase of gait.  Added to HEP.  Gait:  Trialed treadmill gait x 2 minutes, 0.8>1.2 mph with bilateral UE support.  Stopped treadmill gait training due to pt's tendency to keep knees flexed and take smaller step length.  Gait activities around gym, indoor area, multiple laps:  Using bilateral walking poles with PT's facilitation of increased arm swing for improved arm swing and improved step length.  PT provides verbal cues to work on increased step length, increased heelstrike and increased knee extension in stance phase.  Gait with conversation tasks, gait with environmental scanning (with widened base of support to prevent weaving to R or L), gait with head turns to look at cards; forwards/backwards gait x 30 ft, 3 reps.  PT Short Term Goals - 02/18/15 1722    PT SHORT TERM GOAL #1   Title Pt will be independent with HEP for improved strength, balance, and gait.   Status Achieved   PT SHORT TERM GOAL #2   Title Pt will improve  TUG score to less than or equal to 15 seconds for decreased fall risk.   Baseline 14.28 sec with tripod cane; 12.66 sec with no device   Status Achieved   PT SHORT TERM GOAL #3   Title Pt will improve Berg Balance score to at least 31/56 for decreased fall risk.   Baseline 36/56 on 02/18/15   Status Achieved   PT SHORT TERM GOAL #4   Title Pt will perform at least 8  of 10 reps of sit<>stand transfers with minimal UE support, to demonstrate improved lower extremity strength.   Status Achieved           PT Long Term Goals - 01/23/15 1726    PT LONG TERM GOAL #1   Title Pt will verbalize understanding of fall prevention techniques within the home environment.   Time 8   Period Weeks   Status New   PT LONG TERM GOAL #2   Title Pt will improve Timed UP and Go score to less than or equal to 13.5 seconds for decreased fall risk.   Time 8   Period Weeks   Status New   PT LONG TERM GOAL #3   Title Pt will improve Berg Balance score to at least 41/56 for decreased fall risk.   Time 8   Period Weeks   Status New   PT LONG TERM GOAL #4   Title Pt will ambulate at least 200 ft. using cane or no device, modified independently, for improved functional mobility.   Time 8   Period Weeks   Status New   PT LONG TERM GOAL #5   Title Pt will verbalize understanding of transition to community fitness upon D/C from PT.   Time 8   Period Weeks   Status New               Plan - 03/11/15 1422    Clinical Impression Statement Pt continues to need cues and practice for less guarded gait pattern with no device.  Pt tends to have wider BOS, flexed knees in stance phase and decr. step length, although pt able to correct with cues   Pt will benefit from skilled therapeutic intervention in order to improve on the following deficits Abnormal gait;Decreased balance;Decreased coordination;Decreased mobility;Decreased strength;Difficulty walking   Rehab Potential Good   PT Frequency 3x / week   PT Duration 8 weeks  wk 7 of 8   PT Treatment/Interventions ADLs/Self Care Home Management;Functional mobility training;Stair training;Gait training;DME Instruction;Therapeutic activities;Therapeutic exercise;Balance training;Neuromuscular re-education;Patient/family education   PT Next Visit Plan G-CODE, gait training without device, balance, compliant surfaces; ?schedule  more appts   Consulted and Agree with Plan of Care Patient        Problem List Patient Active Problem List   Diagnosis Date Noted  . Anxiety state 12/10/2014  . Neurogenic bladder 11/30/2014  . Neurogenic bowel 11/30/2014  . Bacterial UTI 11/30/2014  . Thoracic myelopathy 11/29/2014  . Persistent atrial fibrillation 11/27/2014  . Traumatic spinal subdural hematoma   . Acute pulmonary embolism   . Orthostatic hypotension 11/22/2014  . Near syncope 11/21/2014  . Paraplegia at T9 level 11/17/2014  . Weakness of both legs   . Nocturnal leg  cramps 07/16/2014  . Encounter for therapeutic drug monitoring 12/25/2013  . Malaise and fatigue 06/01/2011  . Atrial flutter 03/10/2011  . Encounter for long-term (current) use of anticoagulants 03/10/2011  . Benign hypertensive heart disease without heart failure 03/10/2011  . Hypercholesterolemia 03/10/2011  . Paroxysmal atrial flutter   . Palpitations   . PAC (premature atrial contraction)   . PVC's (premature ventricular contractions)   . Malaise   . Fatigue   . Myalgia     Katriana Dortch W. 03/11/2015, 2:24 PM  Klaudia Beirne Bernie Covey, PT 03/11/2015 2:25 PM Phone: [phone] Fax: [phone]   [facility] [address] [place], [place], [address] Phone: [phone]   Fax:  [phone]

## 2015-03-11 NOTE — Patient Instructions (Signed)
HIP: Hamstrings - Short Sitting   Rest leg on raised surface or on the floor. Keep knee straight. Lift chest to sit tall. Hold _30__ seconds, each leg. _3__ reps per set, _2-3__ sets per day  Copyright  VHI. All rights reserved.

## 2015-03-13 ENCOUNTER — Ambulatory Visit: Payer: Self-pay | Admitting: Internal Medicine

## 2015-03-13 ENCOUNTER — Ambulatory Visit: Payer: Medicare Other | Admitting: Physical Therapy

## 2015-03-13 ENCOUNTER — Encounter: Payer: Self-pay | Admitting: Physical Therapy

## 2015-03-13 DIAGNOSIS — M6281 Muscle weakness (generalized): Secondary | ICD-10-CM

## 2015-03-13 DIAGNOSIS — R269 Unspecified abnormalities of gait and mobility: Secondary | ICD-10-CM

## 2015-03-13 DIAGNOSIS — Z5181 Encounter for therapeutic drug level monitoring: Secondary | ICD-10-CM

## 2015-03-13 DIAGNOSIS — I4892 Unspecified atrial flutter: Secondary | ICD-10-CM

## 2015-03-13 NOTE — Therapy (Signed)
[facility] [address] [place], [place], [address] Phone: [phone]   Fax:  [phone]  Physical Therapy Treatment  Patient Details  Name: [name] Weber MRN: [id number] Date of Birth: 10/04/1931 Referring Provider:  Marden NobleGates, Robert, MD  Encounter Date: 03/13/2015      PT End of Session - 03/13/15 1252    Visit Number 20   Number of Visits 25   Date for PT Re-Evaluation 03/24/15   Authorization Type Medicare G-code every 10th visit   PT Start Time 1153   PT Stop Time 1234   PT Time Calculation (min) 41 min   Activity Tolerance Patient tolerated treatment well   Behavior During Therapy [facility] for tasks assessed/performed      Past Medical History  Diagnosis Date  . Paroxysmal atrial flutter   . Palpitations     OCCASSIONAL  . PAC (premature atrial contraction)     ISOLATED  . PVC's (premature ventricular contractions)     ISOLATED  . Malaise   . Fatigue   . Myalgia     Past Surgical History  Procedure Laterality Date  . Vulvar lesion removal  01/01/2009    She had a 1-cm area of erythema to the right of the urethral meatus  . Thoracic laminectomy for epidural abscess N/A 11/16/2014    Procedure: THORACIC LAMINECTOMY FOR EPIDURAL ABSCESS;  Surgeon: Karn CassisErnesto M Botero, MD;  Location: [facility];  Service: Neurosurgery;  Laterality: N/A;    There were no vitals filed for this visit.  Visit Diagnosis:  Abnormality of gait  Muscle weakness of lower extremity      Subjective Assessment - 03/13/15 1251    Subjective Denies falls or changes since last visit.  Still not doing corner exercises on pillow.   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   Currently in Pain? No/denies                       [facility] Adult PT Treatment/Exercise - 03/13/15 1204    Ambulation/Gait   Gait velocity 9.56 sec with cane (3.43 ft/sec)   Berg Balance Test   Sit to Stand Able to stand  independently using hands   Standing Unsupported Able to stand safely 2 minutes   Sitting with Back Unsupported but Feet Supported on Floor or Stool Able to sit safely and securely 2 minutes   Stand to Sit Sits safely with minimal use of hands   Transfers Able to transfer safely, minor use of hands   Standing Unsupported with Eyes Closed Able to stand 10 seconds safely   Standing Ubsupported with Feet Together Able to place feet together independently and stand 1 minute safely   From Standing, Reach Forward with Outstretched Arm Can reach confidently >25 cm (10")   From Standing Position, Pick up Object from Floor Able to pick up shoe, needs supervision  with squat while following back precautions   From Standing Position, Turn to Look Behind Over each Shoulder Looks behind from both sides and weight shifts well  without twisting due to back precautions   Turn 360 Degrees Able to turn 360 degrees safely but slowly   Standing Unsupported, Alternately Place Feet on Step/Stool Able to complete >2 steps/needs minimal assist   Standing Unsupported, One Foot in Front Able to take small step independently and hold 30 seconds  Standing on One Leg Tries to lift leg/unable to hold 3 seconds but remains standing independently   Total Score 44   Timed Up and Go Test   TUG Normal TUG   Normal TUG (seconds) 9.31   High Level Balance   High Level Balance Activities Side stepping;Braiding;Backward walking;Tandem walking;Marching forwards;Marching backwards  in parallel bars with intermittent UE assist   High Level Balance Comments SLS and Tandem stance x 15 sec x 2 reps with intermittent UE support;stepping over/back 2' foam with UE asist, taps to 4"step with intermittent UE assist;rockerboard both directions and standing on foam-both with head turns/nods                PT Education - 03/13/15 1251     Education provided Yes   Education Details HEP, progress toward goals   Person(s) Educated Patient   Methods Explanation;Demonstration;Handout   Comprehension Verbalized understanding          PT Short Term Goals - 02/18/15 1722    PT SHORT TERM GOAL #1   Title Pt will be independent with HEP for improved strength, balance, and gait.   Status Achieved   PT SHORT TERM GOAL #2   Title Pt will improve  TUG score to less than or equal to 15 seconds for decreased fall risk.   Baseline 14.28 sec with tripod cane; 12.66 sec with no device   Status Achieved   PT SHORT TERM GOAL #3   Title Pt will improve Berg Balance score to at least 31/56 for decreased fall risk.   Baseline 36/56 on 02/18/15   Status Achieved   PT SHORT TERM GOAL #4   Title Pt will perform at least 8 of 10 reps of sit<>stand transfers with minimal UE support, to demonstrate improved lower extremity strength.   Status Achieved           PT Long Term Goals - 01/23/15 1726    PT LONG TERM GOAL #1   Title Pt will verbalize understanding of fall prevention techniques within the home environment.   Time 8   Period Weeks   Status New   PT LONG TERM GOAL #2   Title Pt will improve Timed UP and Go score to less than or equal to 13.5 seconds for decreased fall risk.   Time 8   Period Weeks   Status New   PT LONG TERM GOAL #3   Title Pt will improve Berg Balance score to at least 41/56 for decreased fall risk.   Time 8   Period Weeks   Status New   PT LONG TERM GOAL #4   Title Pt will ambulate at least 200 ft. using cane or no device, modified independently, for improved functional mobility.   Time 8   Period Weeks   Status New   PT LONG TERM GOAL #5   Title Pt will verbalize understanding of transition to community fitness upon D/C from PT.   Time 8   Period Weeks   Status New               Plan - 03/13/15 1252    Clinical Impression Statement Pt continues with wide BOS and flexed knees.   Improvement seen in BERG and TUG.  Continue PT per POC.   Pt will benefit from skilled therapeutic intervention in order to improve on the following deficits Abnormal gait;Decreased balance;Decreased coordination;Decreased mobility;Decreased strength;Difficulty walking   Rehab Potential Good   PT Frequency 3x / week  PT Duration 8 weeks  7 of 8 weeks   PT Treatment/Interventions ADLs/Self Care Home Management;Functional mobility training;Stair training;Gait training;DME Instruction;Therapeutic activities;Therapeutic exercise;Balance training;Neuromuscular re-education;Patient/family education   PT Next Visit Plan Begin checking goals, ?schedule more appts, balance and gait   Consulted and Agree with Plan of Care Patient          G-Codes - 2015-03-24 1254    Functional Assessment Tool Used BERG 44, TUG 9.31 sec no device, Gait velocity 9.56 seconds x 68m      Problem List Patient Active Problem List   Diagnosis Date Noted  . Anxiety state 12/10/2014  . Neurogenic bladder 11/30/2014  . Neurogenic bowel 11/30/2014  . Bacterial UTI 11/30/2014  . Thoracic myelopathy 11/29/2014  . Persistent atrial fibrillation 11/27/2014  . Traumatic spinal subdural hematoma   . Acute pulmonary embolism   . Orthostatic hypotension 11/22/2014  . Near syncope 11/21/2014  . Paraplegia at T9 level 11/17/2014  . Weakness of both legs   . Nocturnal leg cramps 07/16/2014  . Encounter for therapeutic drug monitoring 12/25/2013  . Malaise and fatigue 06/01/2011  . Atrial flutter 03/10/2011  . Long term (current) use of anticoagulants 03/10/2011  . Benign hypertensive heart disease without heart failure 03/10/2011  . Hypercholesterolemia 03/10/2011  . Paroxysmal atrial flutter   . Palpitations   . PAC (premature atrial contraction)   . PVC's (premature ventricular contractions)   . Malaise   . Fatigue   . Myalgia     Newell Coral 03-24-2015, 12:55 PM   [facility] [address] [place], [place], [address] Phone: [phone]   Fax:  [phone]     Newell Coral, [facility] 24-Mar-2015 12:57 pm Phone: [phone] Fax: [phone]

## 2015-03-13 NOTE — Patient Instructions (Signed)
ANKLE: Dorsiflexion (Band)   Sit at edge of surface. Place band around top of right foot. Step on band tightly with left foot.  Keeping heel on floor, raise toes of banded foot. Hold 2 seconds. Use yellow band. 10 reps per set, 3 sets per day.  Copyright  VHI. All rights reserved.

## 2015-03-15 ENCOUNTER — Encounter: Payer: Medicare Other | Admitting: Internal Medicine

## 2015-03-15 ENCOUNTER — Ambulatory Visit: Payer: Medicare Other | Admitting: Physical Therapy

## 2015-03-15 DIAGNOSIS — R269 Unspecified abnormalities of gait and mobility: Secondary | ICD-10-CM | POA: Diagnosis not present

## 2015-03-15 DIAGNOSIS — M6281 Muscle weakness (generalized): Secondary | ICD-10-CM

## 2015-03-15 DIAGNOSIS — R6889 Other general symptoms and signs: Secondary | ICD-10-CM

## 2015-03-15 NOTE — Therapy (Signed)
[facility] [address] [place], [place], [address] Phone: [phone]   Fax:  [phone]  Physical Therapy Treatment  Patient Details  Name: [name] Weber MRN: [id number] Date of Birth: 12/22/1930 Referring Provider:  Marden NobleGates, Robert, MD  Encounter Date: 03/15/2015      PT End of Session - 03/15/15 1128    Visit Number 21   Number of Visits 25   Date for PT Re-Evaluation 03/24/15   Authorization Type Medicare G-code every 10th visit   PT Start Time 1017   PT Stop Time 1102   PT Time Calculation (min) 45 min   Equipment Utilized During Treatment Gait belt   Activity Tolerance Patient tolerated treatment well   Behavior During Therapy [facility] for tasks assessed/performed      Past Medical History  Diagnosis Date  . Paroxysmal atrial flutter   . Palpitations     OCCASSIONAL  . PAC (premature atrial contraction)     ISOLATED  . PVC's (premature ventricular contractions)     ISOLATED  . Malaise   . Fatigue   . Myalgia     Past Surgical History  Procedure Laterality Date  . Vulvar lesion removal  01/01/2009    She had a 1-cm area of erythema to the right of the urethral meatus  . Thoracic laminectomy for epidural abscess N/A 11/16/2014    Procedure: THORACIC LAMINECTOMY FOR EPIDURAL ABSCESS;  Surgeon: Karn CassisErnesto M Botero, MD;  Location: [facility];  Service: Neurosurgery;  Laterality: N/A;    There were no vitals filed for this visit.  Visit Diagnosis:  Muscle weakness of lower extremity  Postural instability of trunk  Abnormality of gait      Subjective Assessment - 03/15/15 1021    Subjective The bone on R lower leg just aches sometimes, even in sitting.  Comes and goes.  Still doing my exercises, just trying to be careful about leg.   Currently in Pain? Yes   Pain Score 3    Pain Location Leg  R hip-comes and goes   Pain Orientation Right   Pain Descriptors / Indicators Aching   Pain Type  Acute pain   Pain Onset In the past 7 days   Pain Frequency Intermittent   Aggravating Factors  Nothing in particular   Pain Relieving Factors Comes and goes      Neuro Re-education: At 6 inch and 12 inch step:  Consecutive step taps forward x 15, then to side x 10 reps each leg with 1 UE support.  Forward step ups x 10 reps, lateral step downs x 10 reps, forward step downs x 10 reps for improved functional strength for improved single limb stance.  Standing on foam for alternating step taps 10 reps to 12 inch step with bilateral UE support.    Dynamic balance activities with UE support at counter, forward crossover steps, back crossover steps, braiding x 30 ft, 2-3 reps each side.  Tandem gait, tandem marching 2-3 reps x 30 ft with UE support, cues for visual target.  On ramp: standing on foam on incline/decline-marching in place x 20 reps, forward step taps x 10 reps, then back step taps x 10 reps; standing with feet apart head turns x 10 reps, head nods x 10 reps, with eyes open and eyes closed.  PT provides minimal assistance for balance and cues for widened BOS.  Sidestepping up and down ramp, each leg leading 5 reps.  PT Short Term Goals - 02/18/15 1722    PT SHORT TERM GOAL #1   Title Pt will be independent with HEP for improved strength, balance, and gait.   Status Achieved   PT SHORT TERM GOAL #2   Title Pt will improve  TUG score to less than or equal to 15 seconds for decreased fall risk.   Baseline 14.28 sec with tripod cane; 12.66 sec with no device   Status Achieved   PT SHORT TERM GOAL #3   Title Pt will improve Berg Balance score to at least 31/56 for decreased fall risk.   Baseline 36/56 on 02/18/15   Status Achieved   PT SHORT TERM GOAL #4   Title Pt will perform at least 8 of 10 reps of sit<>stand transfers with minimal UE support, to demonstrate improved lower extremity strength.   Status Achieved           PT  Long Term Goals - 01/23/15 1726    PT LONG TERM GOAL #1   Title Pt will verbalize understanding of fall prevention techniques within the home environment.   Time 8   Period Weeks   Status New   PT LONG TERM GOAL #2   Title Pt will improve Timed UP and Go score to less than or equal to 13.5 seconds for decreased fall risk.   Time 8   Period Weeks   Status New   PT LONG TERM GOAL #3   Title Pt will improve Berg Balance score to at least 41/56 for decreased fall risk.   Time 8   Period Weeks   Status New   PT LONG TERM GOAL #4   Title Pt will ambulate at least 200 ft. using cane or no device, modified independently, for improved functional mobility.   Time 8   Period Weeks   Status New   PT LONG TERM GOAL #5   Title Pt will verbalize understanding of transition to community fitness upon D/C from PT.   Time 8   Period Weeks   Status New               Plan - 03/15/15 1129    Clinical Impression Statement Pt appears more hesitant with movements today due to R leg discomfort.  Encouraged pt to contact physician if pain persists or limits mobility.   Pt will benefit from skilled therapeutic intervention in order to improve on the following deficits Abnormal gait;Decreased balance;Decreased coordination;Decreased mobility;Decreased strength;Difficulty walking   Rehab Potential Good   PT Frequency 3x / week   PT Duration 8 weeks  wk 7 of 8   PT Treatment/Interventions ADLs/Self Care Home Management;Functional mobility training;Stair training;Gait training;DME Instruction;Therapeutic activities;Therapeutic exercise;Balance training;Neuromuscular re-education;Patient/family education   PT Next Visit Plan gait training, balance; check remaining long term goals; renew after next week   Consulted and Agree with Plan of Care Patient        Problem List Patient Active Problem List   Diagnosis Date Noted  . Anxiety state 12/10/2014  . Neurogenic bladder 11/30/2014  . Neurogenic  bowel 11/30/2014  . Bacterial UTI 11/30/2014  . Thoracic myelopathy 11/29/2014  . Persistent atrial fibrillation 11/27/2014  . Traumatic spinal subdural hematoma   . Acute pulmonary embolism   . Orthostatic hypotension 11/22/2014  . Near syncope 11/21/2014  . Paraplegia at T9 level 11/17/2014  . Weakness of both legs   . Nocturnal leg cramps 07/16/2014  . Encounter for therapeutic drug monitoring 12/25/2013  . Malaise  and fatigue 06/01/2011  . Atrial flutter 03/10/2011  . Long term (current) use of anticoagulants 03/10/2011  . Benign hypertensive heart disease without heart failure 03/10/2011  . Hypercholesterolemia 03/10/2011  . Paroxysmal atrial flutter   . Palpitations   . PAC (premature atrial contraction)   . PVC's (premature ventricular contractions)   . Malaise   . Fatigue   . Myalgia     Colsen Modi W. 03/15/2015, 11:31 AM  Lonia Blood, PT 03/15/2015 11:31 AM Phone: [phone] Fax: [phone]   [facility] [address] [place], [place], [address] Phone: [phone]   Fax:  [phone]

## 2015-03-19 ENCOUNTER — Ambulatory Visit: Payer: Medicare Other | Admitting: Physical Therapy

## 2015-03-19 DIAGNOSIS — R269 Unspecified abnormalities of gait and mobility: Secondary | ICD-10-CM | POA: Diagnosis not present

## 2015-03-19 NOTE — Therapy (Signed)
[facility] [address] [place], [place], [address] Phone: [phone]   Fax:  [phone]  Physical Therapy Treatment  Patient Details  Name: [name] Weber MRN: [id number] Date of Birth: Jan 15, 1931 Referring Provider:  Josetta Huddle, MD  Encounter Date: 03/19/2015      PT End of Session - 03/19/15 1415    Visit Number 22   Number of Visits 25   Date for PT Re-Evaluation 03/24/15   PT Start Time 1314   PT Stop Time 1359   PT Time Calculation (min) 45 min   Equipment Utilized During Treatment Gait belt   Activity Tolerance Patient tolerated treatment well   Behavior During Therapy [facility] for tasks assessed/performed      Past Medical History  Diagnosis Date  . Paroxysmal atrial flutter   . Palpitations     OCCASSIONAL  . PAC (premature atrial contraction)     ISOLATED  . PVC's (premature ventricular contractions)     ISOLATED  . Malaise   . Fatigue   . Myalgia     Past Surgical History  Procedure Laterality Date  . Vulvar lesion removal  01/01/2009    She had a 1-cm area of erythema to the right of the urethral meatus  . Thoracic laminectomy for epidural abscess N/A 11/16/2014    Procedure: THORACIC LAMINECTOMY FOR EPIDURAL ABSCESS;  Surgeon: Floyce Stakes, MD;  Location: [place];  Service: Neurosurgery;  Laterality: N/A;    There were no vitals filed for this visit.  Visit Diagnosis:  Abnormality of gait      Subjective Assessment - 03/19/15 1316    Subjective Walked up to my neighbor's this morning.  Not having that soreness in lower leg area.   Currently in Pain? No/denies            [facility] PT Assessment - 03/19/15 0001    Dynamic Gait Index   Level Surface Mild Impairment   Change in Gait Speed Mild Impairment   Gait with Horizontal Head Turns Mild Impairment   Gait with Vertical Head Turns Mild Impairment   Gait and Pivot Turn Normal   Step Over Obstacle Mild Impairment   Step Around Obstacles Mild Impairment   Steps Mild Impairment   Total Score 17   Functional Gait  Assessment   Gait assessed  Yes   Gait Level Surface Walks 20 ft in less than 7 sec but greater than 5.5 sec, uses assistive device, slower speed, mild gait deviations, or deviates 6-10 in outside of the 12 in walkway width.  6.38 sec   Change in Gait Speed Able to change speed, demonstrates mild gait deviations, deviates 6-10 in outside of the 12 in walkway width, or no gait deviations, unable to achieve a major change in velocity, or uses a change in velocity, or uses an assistive device.   Gait with Horizontal Head Turns Performs head turns smoothly with slight change in gait velocity (eg, minor disruption to smooth gait path), deviates 6-10 in outside 12 in walkway width, or uses an assistive device.   Gait with Vertical Head Turns Performs task with slight change in gait velocity (eg, minor disruption to smooth gait path), deviates 6 - 10 in outside 12 in walkway width or uses assistive device   Gait and Pivot Turn Pivot turns safely within 3 sec and stops quickly with no loss of balance.   Step Over Obstacle Is able to step over one shoe box (4.5 in  total height) without changing gait speed. No evidence of imbalance.   Gait with Narrow Base of Support Ambulates less than 4 steps heel to toe or cannot perform without assistance.   Gait with Eyes Closed Cannot walk 20 ft without assistance, severe gait deviations or imbalance, deviates greater than 15 in outside 12 in walkway width or will not attempt task.   Ambulating Backwards Walks 20 ft, no assistive devices, good speed, no evidence for imbalance, normal gait   Steps Alternating feet, must use rail.   Total Score 18        On ramp: Pt stands on solid surface on incline/decline-Marching x 15 reps, forward step taps x 10 reps.    Standing with feet apart,  head turns x  10 reps, head nods x 10 reps, with eyes closed.  PT provides min guard  assistance/close supervision for balance and cues for posture/maintaining balance.  Counter balance exercises-forward crossover steps, back crossover steps x 30 ft, then braiding 30 ft x 2 each direction.  Tandem gait then tandem march 30 ft each direction with 1 UE support and therapist's min guard assistance.  On compliant mat surface:  Forward/back walking, forward/back marching, sidestepping, sidestep taps to cones with min guard assistance and cue for maintaining wide BOS and balance.  Standing on mat surface:  Forward step taps down to floor; side step taps down to floor x 10 reps each.  Figure-8 turns on mat surface and on solid surface, quick turn and stop/quick turn and keep walking with supervision, with no assistive device.                      PT Education - 03/19/15 1414    Education provided Yes   Education Details Fall prevention, plan of care; plans to renew beyond this week for 4 additional weeks; discussed pt's goals-she will get back to me next session any specific things she wanted to work on   [employer]) Educated Patient   Methods Explanation;Handout   Comprehension Verbalized understanding          PT Short Term Goals - 02/18/15 1722    PT SHORT TERM GOAL #1   Title Pt will be independent with HEP for improved strength, balance, and gait.   Status Achieved   PT SHORT TERM GOAL #2   Title Pt will improve  TUG score to less than or equal to 15 seconds for decreased fall risk.   Baseline 14.28 sec with tripod cane; 12.66 sec with no device   Status Achieved   PT SHORT TERM GOAL #3   Title Pt will improve Berg Balance score to at least 31/56 for decreased fall risk.   Baseline 36/56 on 02/18/15   Status Achieved   PT SHORT TERM GOAL #4   Title Pt will perform at least 8 of 10 reps of sit<>stand transfers with minimal UE support, to demonstrate improved lower extremity strength.   Status Achieved           PT Long Term Goals - 03/19/15 1417     PT LONG TERM GOAL #1   Title Pt will verbalize understanding of fall prevention techniques within the home environment.   Status Achieved   PT LONG TERM GOAL #2   Title Pt will improve Timed UP and Go score to less than or equal to 13.5 seconds for decreased fall risk.   Baseline TUG 9.5 seconds   Status Achieved   PT LONG TERM GOAL #  3   Title Pt will improve Berg Balance score to at least 41/56 for decreased fall risk.   Baseline Berg 44/56   Status Achieved   PT LONG TERM GOAL #4   Title Pt will ambulate at least 200 ft. using cane or no device, modified independently, for improved functional mobility.   Baseline with cane modified independently; with no device supervision   Status Achieved   PT LONG TERM GOAL #5   Title Pt will verbalize understanding of transition to community fitness upon D/C from PT.   Status On-going               Plan - 03/19/15 1417    Clinical Impression Statement Pt has met LTG #1-4.  LTG #5 will likely remain ongoing.  Pt's plan of care remains through this week, but PT plans to update goals to relect need to continue to improved static and dynamic balance and gait.  While pt is progressing with balance and gait, pt does remain at fall risk per Dynamic Gait and Functional Gait Assessment scores. Pt will continue to benefit from further skilled PT to address additional functional strength, balance and gait training for imrpoved mobilit and decreased fall risk.    Pt will benefit from skilled therapeutic intervention in order to improve on the following deficits Abnormal gait;Decreased balance;Decreased coordination;Decreased mobility;Decreased strength;Difficulty walking   Rehab Potential Good   PT Frequency 3x / week   PT Duration 8 weeks  wk 8 of 8   PT Treatment/Interventions ADLs/Self Care Home Management;Functional mobility training;Stair training;Gait training;DME Instruction;Therapeutic activities;Therapeutic exercise;Balance  training;Neuromuscular re-education;Patient/family education   PT Next Visit Plan renew goals at the end of this week; continue working on balance, single limb stance, gait activities        Problem List Patient Active Problem List   Diagnosis Date Noted  . Anxiety state 12/10/2014  . Neurogenic bladder 11/30/2014  . Neurogenic bowel 11/30/2014  . Bacterial UTI 11/30/2014  . Thoracic myelopathy 11/29/2014  . Persistent atrial fibrillation 11/27/2014  . Traumatic spinal subdural hematoma   . Acute pulmonary embolism   . Orthostatic hypotension 11/22/2014  . Near syncope 11/21/2014  . Paraplegia at T9 level 11/17/2014  . Weakness of both legs   . Nocturnal leg cramps 07/16/2014  . Encounter for therapeutic drug monitoring 12/25/2013  . Malaise and fatigue 06/01/2011  . Atrial flutter 03/10/2011  . Long term (current) use of anticoagulants 03/10/2011  . Benign hypertensive heart disease without heart failure 03/10/2011  . Hypercholesterolemia 03/10/2011  . Paroxysmal atrial flutter   . Palpitations   . PAC (premature atrial contraction)   . PVC's (premature ventricular contractions)   . Malaise   . Fatigue   . Myalgia     Jayceion Lisenby W. 03/19/2015, 2:23 PM  Mady Haagensen, PT 03/19/2015 2:23 PM Phone: [phone] Fax: [place] [address] [place], [place], [address] Phone: [phone]   Fax:  [phone]

## 2015-03-19 NOTE — Patient Instructions (Signed)

## 2015-03-20 ENCOUNTER — Ambulatory Visit: Payer: Medicare Other | Admitting: Physical Therapy

## 2015-03-20 DIAGNOSIS — M6281 Muscle weakness (generalized): Secondary | ICD-10-CM

## 2015-03-20 DIAGNOSIS — R269 Unspecified abnormalities of gait and mobility: Secondary | ICD-10-CM

## 2015-03-20 NOTE — Therapy (Signed)
[facility] [address] [place], [place], [address] Phone: [phone]   Fax:  [phone]  Physical Therapy Treatment  Patient Details  Name: [name] Weber MRN: [id number] Date of Birth: [birth year] Referring Provider:  Marden Noble, MD  Encounter Date: 03/20/2015      PT End of Session - 03/20/15 2107    Visit Number 23   Number of Visits 25   Date for PT Re-Evaluation 03/24/15   Authorization Type Medicare G-code every 10th visit   PT Start Time 1152   PT Stop Time 1233   PT Time Calculation (min) 41 min   Equipment Utilized During Treatment Gait belt   Activity Tolerance Patient tolerated treatment well   Behavior During Therapy [facility] for tasks assessed/performed      Past Medical History  Diagnosis Date  . Paroxysmal atrial flutter   . Palpitations     OCCASSIONAL  . PAC (premature atrial contraction)     ISOLATED  . PVC's (premature ventricular contractions)     ISOLATED  . Malaise   . Fatigue   . Myalgia     Past Surgical History  Procedure Laterality Date  . Vulvar lesion removal  01/01/2009    She had a 1-cm area of erythema to the right of the urethral meatus  . Thoracic laminectomy for epidural abscess N/A 11/16/2014    Procedure: THORACIC LAMINECTOMY FOR EPIDURAL ABSCESS;  Surgeon: Karn Cassis, MD;  Location: [facility];  Service: Neurosurgery;  Laterality: N/A;    There were no vitals filed for this visit.  Visit Diagnosis:  Abnormality of gait  Muscle weakness of lower extremity      Subjective Assessment - 03/20/15 1154    Subjective No changes; no problems at my home with fall prevention information   Currently in Pain? No/denies       Neuro Re-education:   On ramp: Pt stands on compliant surface on incline/decline-Marching x 10 reps, forward step taps x 10 reps, then back step taps x 10reps.  Standing with feet semi-tandem then apart,  head turns x  10 reps, head  nods x 10 reps, with eyes closed.  PT provides min guard assistance for balance.  Pt reaches out for therapist several times due to feeling of loss of balance with semi-tandem stance position with head movements.  On compliant mat surface:  Forward/back walking, forward/back marching, side stepping, step taps down to floor x 10 reps then step taps up to 6 inch step x 10 reps with min guard assistance.  Therapeutic Exercise:  Strengthening exercises at counter:  Hip abduction x 10 reps, hamstring curls x 10 reps with 2# each leg; hip extension 10 reps no weight, then hip flexion march x 10 reps with 3 second hold, for hip strengthening, with cues for upright posture/neutral hip positioning.  Gait training: Gait training activities on outdoor surfaces x 800 ft., including inclines, declines, curb step, sidewalk, stepping over and around obstacles, negotiating grassy surfaces with cane, with supervision/min guard assistance.  Gait on indoor surfaces x 300 ft using cane, modified independently.                        PT Education - 03/19/15 1414    Education provided Yes   Education Details Fall prevention, plan of care; plans to renew beyond this week for 4 additional weeks; discussed pt's goals-she will get back to me next session any specific things she  wanted to work on   [employer]) Educated Patient   Methods Explanation;Handout   Comprehension Verbalized understanding          PT Short Term Goals - 02/18/15 1722    PT SHORT TERM GOAL #1   Title Pt will be independent with HEP for improved strength, balance, and gait.   Status Achieved   PT SHORT TERM GOAL #2   Title Pt will improve  TUG score to less than or equal to 15 seconds for decreased fall risk.   Baseline 14.28 sec with tripod cane; 12.66 sec with no device   Status Achieved   PT SHORT TERM GOAL #3   Title Pt will improve Berg Balance score to at least 31/56 for decreased fall risk.   Baseline 36/56 on  02/18/15   Status Achieved   PT SHORT TERM GOAL #4   Title Pt will perform at least 8 of 10 reps of sit<>stand transfers with minimal UE support, to demonstrate improved lower extremity strength.   Status Achieved           PT Long Term Goals - 03/19/15 1417    PT LONG TERM GOAL #1   Title Pt will verbalize understanding of fall prevention techniques within the home environment.   Status Achieved   PT LONG TERM GOAL #2   Title Pt will improve Timed UP and Go score to less than or equal to 13.5 seconds for decreased fall risk.   Baseline TUG 9.5 seconds   Status Achieved   PT LONG TERM GOAL #3   Title Pt will improve Berg Balance score to at least 41/56 for decreased fall risk.   Baseline Berg 44/56   Status Achieved   PT LONG TERM GOAL #4   Title Pt will ambulate at least 200 ft. using cane or no device, modified independently, for improved functional mobility.   Baseline with cane modified independently; with no device supervision   Status Achieved   PT LONG TERM GOAL #5   Title Pt will verbalize understanding of transition to community fitness upon D/C from PT.   Status On-going               Plan - 03/20/15 2107    Clinical Impression Statement During concentrated compliant surface practice on incline/decline, pt experiences several loss of balance.  Pt continues to demonstrate some functional hip weakness, with decreased stability for balance on unlevel and compliant surfaces.  Pt would continue to benefit from further skilled PT to address balance and gait and strength.   Pt will benefit from skilled therapeutic intervention in order to improve on the following deficits Abnormal gait;Decreased balance;Decreased coordination;Decreased mobility;Decreased strength;Difficulty walking   Rehab Potential Good   PT Frequency 3x / week   PT Duration 8 weeks  wk 8 of 8   PT Treatment/Interventions ADLs/Self Care Home Management;Functional mobility training;Stair training;Gait  training;DME Instruction;Therapeutic activities;Therapeutic exercise;Balance training;Neuromuscular re-education;Patient/family education   PT Next Visit Plan renew goals at the end of this week; continue working on balance, single limb stance, gait activities   Consulted and Agree with Plan of Care Patient        Problem List Patient Active Problem List   Diagnosis Date Noted  . Anxiety state 12/10/2014  . Neurogenic bladder 11/30/2014  . Neurogenic bowel 11/30/2014  . Bacterial UTI 11/30/2014  . Thoracic myelopathy 11/29/2014  . Persistent atrial fibrillation 11/27/2014  . Traumatic spinal subdural hematoma   . Acute pulmonary embolism   .  Orthostatic hypotension 11/22/2014  . Near syncope 11/21/2014  . Paraplegia at T9 level 11/17/2014  . Weakness of both legs   . Nocturnal leg cramps 07/16/2014  . Encounter for therapeutic drug monitoring 12/25/2013  . Malaise and fatigue 06/01/2011  . Atrial flutter 03/10/2011  . Long term (current) use of anticoagulants 03/10/2011  . Benign hypertensive heart disease without heart failure 03/10/2011  . Hypercholesterolemia 03/10/2011  . Paroxysmal atrial flutter   . Palpitations   . PAC (premature atrial contraction)   . PVC's (premature ventricular contractions)   . Malaise   . Fatigue   . Myalgia     Jojo Pehl W. 03/20/2015, 9:11 PM  Lonia Blood, PT 03/20/2015 9:12 PM Phone: [phone] Fax: [phone]   [facility] [address] [place], [place], [address] Phone: [phone]   Fax:  [phone]

## 2015-03-22 ENCOUNTER — Ambulatory Visit: Payer: Medicare Other | Admitting: Physical Therapy

## 2015-03-22 DIAGNOSIS — R6889 Other general symptoms and signs: Secondary | ICD-10-CM

## 2015-03-22 DIAGNOSIS — R269 Unspecified abnormalities of gait and mobility: Secondary | ICD-10-CM

## 2015-03-22 DIAGNOSIS — M6281 Muscle weakness (generalized): Secondary | ICD-10-CM

## 2015-03-22 NOTE — Therapy (Signed)
[facility] [address] [place], [place], [address] Phone: [phone]   Fax:  [phone]  Physical Therapy Treatment  Patient Details  Name: [name] Weber MRN: [id number] Date of Birth: 02-Jan-1931 Referring Provider:  Marden Noble, MD  Encounter Date: 03/22/2015      PT End of Session - 03/22/15 1234    Visit Number 24   Number of Visits 32  recert today for 2x/wk 4 weeks   Date for PT Re-Evaluation 05/21/15   Authorization Type Medicare G-code every 10th visit   PT Start Time 1025   PT Stop Time 1105   PT Time Calculation (min) 40 min   Equipment Utilized During Treatment Gait belt   Activity Tolerance Patient tolerated treatment well   Behavior During Therapy [name] for tasks assessed/performed      Past Medical History  Diagnosis Date  . Paroxysmal atrial flutter   . Palpitations     OCCASSIONAL  . PAC (premature atrial contraction)     ISOLATED  . PVC's (premature ventricular contractions)     ISOLATED  . Malaise   . Fatigue   . Myalgia     Past Surgical History  Procedure Laterality Date  . Vulvar lesion removal  01/01/2009    She had a 1-cm area of erythema to the right of the urethral meatus  . Thoracic laminectomy for epidural abscess N/A 11/16/2014    Procedure: THORACIC LAMINECTOMY FOR EPIDURAL ABSCESS;  Surgeon: Karn Cassis, MD;  Location: [facility];  Service: Neurosurgery;  Laterality: N/A;    There were no vitals filed for this visit.  Visit Diagnosis:  Abnormality of gait  Muscle weakness of lower extremity  Postural instability of trunk      Subjective Assessment - 03/22/15 1026    Subjective Did a lot of housework and walking yesterday; no pain, no falls   Currently in Pain? No/denies          Neuro Re-education: Hip Strengthening Exercises at counter, including lateral weightshifting>lateral weightshift and lift for 2-3 seconds for improved hip stability  in single limb stance with bilateral UE support.  Standing semi-tandem position with anterior/posterior weightshifting>weightshift with lifting lower extremities for improved single limb stance and hip stability.  Discussed how these weightshifting positions are also helpful with standing activities such as ironing and vacuuming to avoid low back/hip pain and fatigue.  On ramp: Pt stands on solid surface on incline/decline-Marching x 15 reps, forward step taps x 10 reps, then back step taps x 10reps.  Standing with feet semi-tandem,  head turns x  10 reps, head nods x 10 reps, with eyes open, then 10 second hold of eyes closed.  PT provides min guard assistance for balance.  Sidestepping up and down ramp, 3 reps each direction.  Floor ladder balance activities to promote improved coordination and dynamic balance:  Varied direction and sequence stepping in and out of floor ladder with min guard assistance and occasional HHA.  Negotiation of stepping around and on obstacles with HHA.                        PT Education - 03/22/15 1233    Education provided Yes   Education Details HEP-lateral weightshifting, ant/posterior stagger-stance weightshifting with UE support, progressing to lifting legs   Person(s) Educated Patient   Methods Explanation;Handout   Comprehension Verbalized understanding          PT Short Term Goals -  02/18/15 1722    PT SHORT TERM GOAL #1   Title Pt will be independent with HEP for improved strength, balance, and gait.   Status Achieved   PT SHORT TERM GOAL #2   Title Pt will improve  TUG score to less than or equal to 15 seconds for decreased fall risk.   Baseline 14.28 sec with tripod cane; 12.66 sec with no device   Status Achieved   PT SHORT TERM GOAL #3   Title Pt will improve Berg Balance score to at least 31/56 for decreased fall risk.   Baseline 36/56 on 02/18/15   Status Achieved   PT SHORT TERM GOAL #4   Title Pt will perform at least 8  of 10 reps of sit<>stand transfers with minimal UE support, to demonstrate improved lower extremity strength.   Status Achieved           PT Long Term Goals - 03/22/15 1240    PT LONG TERM GOAL #1   Title Pt will demonstrate independence in progression of HEP to address strength and balance. (Target 04/21/15)   Time 4   Period Weeks   Status New   PT LONG TERM GOAL #2   Title Pt will improve Berg Balance score to at least 49/56 for decreased fall risk.   Baseline Sharlene Motts 44/56   Time 4   Period Weeks   Status New   PT LONG TERM GOAL #3   Title Pt will improve Dynamic Gait Index score to at least 19/24 for decreased fall risk.   Baseline DGI 17/24   Time 4   Status New   PT LONG TERM GOAL #4   Title Pt will ambulate at least 1000 ft indoor and outdoor surfaces no device, for improved funcitonal mobility.   Time 4   Period Weeks   Status New   PT LONG TERM GOAL #5   Title Pt will verbalize understanding of transition to community fitness upon D/C from PT.   Time 4   Period Weeks   Status On-going               Plan - 03/22/15 1235    Clinical Impression Statement Pt continues to have decreased balance on compliant surfaces and with dynamic gait activities.  Pt has made significant progression in therapy since initial evaluation.  Pt remains at fall risk per Berg score of 44/56 and Dynamic Gait Index score of 17/24.  Pt will continue to benefit from further skilled PT to address functional mobility and decrease risk of falls.    Pt will benefit from skilled therapeutic intervention in order to improve on the following deficits Abnormal gait;Decreased balance;Decreased coordination;Decreased mobility;Decreased strength;Difficulty walking   Rehab Potential Good   PT Frequency 2x / week  decr to 2x/wk recert today, 0/98/11   PT Duration 4 weeks  recert today 2x/wk 4 weeks   PT Treatment/Interventions ADLs/Self Care Home Management;Functional mobility training;Stair  training;Gait training;DME Instruction;Therapeutic activities;Therapeutic exercise;Balance training;Neuromuscular re-education;Patient/family education   PT Next Visit Plan  continue working on balance, single limb stance, gait activities/ hip stability   Consulted and Agree with Plan of Care Patient        Problem List Patient Active Problem List   Diagnosis Date Noted  . Anxiety state 12/10/2014  . Neurogenic bladder 11/30/2014  . Neurogenic bowel 11/30/2014  . Bacterial UTI 11/30/2014  . Thoracic myelopathy 11/29/2014  . Persistent atrial fibrillation 11/27/2014  . Traumatic spinal subdural hematoma   .  Acute pulmonary embolism   . Orthostatic hypotension 11/22/2014  . Near syncope 11/21/2014  . Paraplegia at T9 level 11/17/2014  . Weakness of both legs   . Nocturnal leg cramps 07/16/2014  . Encounter for therapeutic drug monitoring 12/25/2013  . Malaise and fatigue 06/01/2011  . Atrial flutter 03/10/2011  . Long term (current) use of anticoagulants 03/10/2011  . Benign hypertensive heart disease without heart failure 03/10/2011  . Hypercholesterolemia 03/10/2011  . Paroxysmal atrial flutter   . Palpitations   . PAC (premature atrial contraction)   . PVC's (premature ventricular contractions)   . Malaise   . Fatigue   . Myalgia     Rida Loudin W. 03/22/2015, 12:43 PM  Lonia Bloodmy Sharanda Shinault, PT 03/22/2015 12:43 PM Phone: [phone] Fax: [phone]  [facility] [address] [place], [place], [address] Phone: [phone]   Fax:  [phone]

## 2015-03-25 ENCOUNTER — Ambulatory Visit (INDEPENDENT_AMBULATORY_CARE_PROVIDER_SITE_OTHER): Payer: Medicare Other | Admitting: Internal Medicine

## 2015-03-25 ENCOUNTER — Encounter: Payer: Self-pay | Admitting: Internal Medicine

## 2015-03-25 VITALS — BP 136/58 | HR 70 | Ht 66.5 in | Wt 105.8 lb

## 2015-03-25 DIAGNOSIS — I482 Chronic atrial fibrillation, unspecified: Secondary | ICD-10-CM

## 2015-03-25 DIAGNOSIS — E785 Hyperlipidemia, unspecified: Secondary | ICD-10-CM

## 2015-03-25 NOTE — Patient Instructions (Signed)
Your physician recommends that you continue on your current medications as directed. Please refer to the Current Medication list given to you today.     

## 2015-03-25 NOTE — Progress Notes (Signed)
Cardiology Office Note   Date:  03/25/2015   ID:  [name] Weber, DOB Jan 02, 1931, MRN [id number]  PCP:  Pearla Dubonnet, MD  Cardiologist:   Dietrich Pates, MD   No chief complaint on file.     History of Present Illness: [name] Weber is a 81 y.o. female with a history of.PAF, hyperlipidemia  She was previously followed by Wylene Simmer  She was admitted in Dec 2015 with back pain and inability to walk Found to have a Underwent surgical decmpression Seen by cardiology during admit In/out of atrial flutter Started on florinef due to orthostatic hypotension.  I saw her for the first time several wks ago  BP was high and she was troubled by LE edema  I recomm stopping florinef  I also recomm a few days of lasix Since then the edema has improved   Breathing is OK  She deneis dizziness      Current Outpatient Prescriptions  Medication Sig Dispense Refill  . alendronate (FOSAMAX) 35 MG tablet Take 35 mg by mouth every 7 (seven) days. Take with a full glass of water on an empty stomach. Sunday    . aspirin EC 81 MG EC tablet Take 1 tablet (81 mg total) by mouth daily.    Marland Kitchen atorvastatin (LIPITOR) 10 MG tablet Take 10 mg by mouth daily.    . digoxin (LANOXIN) 0.125 MG tablet Take 1 tablet (0.125 mg total) by mouth daily. 30 tablet 1   No current facility-administered medications for this visit.    Allergies:   Crestor   Past Medical History  Diagnosis Date  . Paroxysmal atrial flutter   . Palpitations     OCCASSIONAL  . PAC (premature atrial contraction)     ISOLATED  . PVC's (premature ventricular contractions)     ISOLATED  . Malaise   . Fatigue   . Myalgia     Past Surgical History  Procedure Laterality Date  . Vulvar lesion removal  01/01/2009    She had a 1-cm area of erythema to the right of the urethral meatus  . Thoracic laminectomy for epidural abscess N/A 11/16/2014    Procedure: THORACIC LAMINECTOMY FOR EPIDURAL ABSCESS;  Surgeon: Karn Cassis, MD;  Location: [facility];  Service: Neurosurgery;  Laterality: N/A;     Social History:  The patient  reports that she quit smoking about 24 years ago. She does not have any smokeless tobacco history on file. She reports that she does not drink alcohol or use illicit drugs.   Family History:  The patient's family history includes Hyperlipidemia in her mother.    ROS:  Please see the history of present illness. All other systems are reviewed and  Negative to the above problem except as noted.    PHYSICAL EXAM: VS:  BP 136/58 mmHg  Pulse 70  Ht 5' 6.5" (1.689 m)  Wt 105 lb 12.8 oz (47.991 kg)  BMI 16.82 kg/m2  GEN: Well nourished, well developed, in no acute distress HEENT: normal Neck: no JVD, carotid bruits, or masses Cardiac:Irreg no murmurs, rubs, or gallops,no edema  Respiratory:  clear to auscultation bilaterally, normal work of breathing GI: soft, nontender, nondistended, + BS  No hepatomegaly  MS: no deformity Moving all extremities   Skin: warm and dry, no rash Neuro:  Deferred   Psych: euthymic mood, full affect   EKG:  EKG is not ordered today.   Lipid Panel    Component Value Date/Time  CHOL 230* 02/18/2012 0937   TRIG 50.0 02/18/2012 0937   HDL 87.90 02/18/2012 0937   CHOLHDL 3 02/18/2012 0937   VLDL 10.0 02/18/2012 0937   LDLDIRECT 126.0 02/18/2012 0937      Wt Readings from Last 3 Encounters:  03/25/15 105 lb 12.8 oz (47.991 kg)  02/18/15 117 lb (53.071 kg)  01/02/15 108 lb 6.4 oz (49.17 kg)      ASSESSMENT AND PLAN:  1  Edema  Imprved    2.  HTN  Better now that she is off of florinef  Follow  3.  Atrial fib/flutter  Rates controlld on holter monitor   Unfort she had a severe bleed while on coumadin  INR was not supratherapeutic at time and she denies injury  With that I do not think she is a candidate for anticoagulatoin Will review options for LAA occlusion  4.  Neuro  Continues to go to rehab   F/U with me later this year      Signed, Dietrich PatesPaula Leelynn Whetsel, MD  03/25/2015 11:21 AM    [facility] [address], [place], [place]  [address] Phone: [phone]; Fax: [phone]

## 2015-03-27 ENCOUNTER — Encounter: Payer: Self-pay | Admitting: Physical Medicine & Rehabilitation

## 2015-03-27 ENCOUNTER — Encounter: Payer: Medicare Other | Attending: Physical Medicine & Rehabilitation | Admitting: Physical Medicine & Rehabilitation

## 2015-03-27 VITALS — BP 114/68 | HR 64

## 2015-03-27 DIAGNOSIS — S065X0S Traumatic subdural hemorrhage without loss of consciousness, sequela: Secondary | ICD-10-CM | POA: Insufficient documentation

## 2015-03-27 DIAGNOSIS — Z5181 Encounter for therapeutic drug level monitoring: Secondary | ICD-10-CM | POA: Diagnosis not present

## 2015-03-27 DIAGNOSIS — Z79899 Other long term (current) drug therapy: Secondary | ICD-10-CM | POA: Insufficient documentation

## 2015-03-27 DIAGNOSIS — N319 Neuromuscular dysfunction of bladder, unspecified: Secondary | ICD-10-CM | POA: Insufficient documentation

## 2015-03-27 DIAGNOSIS — G839 Paralytic syndrome, unspecified: Secondary | ICD-10-CM | POA: Insufficient documentation

## 2015-03-27 DIAGNOSIS — S065X9S Traumatic subdural hemorrhage with loss of consciousness of unspecified duration, sequela: Secondary | ICD-10-CM

## 2015-03-27 DIAGNOSIS — G8220 Paraplegia, unspecified: Secondary | ICD-10-CM

## 2015-03-27 DIAGNOSIS — I119 Hypertensive heart disease without heart failure: Secondary | ICD-10-CM | POA: Insufficient documentation

## 2015-03-27 NOTE — Progress Notes (Signed)
Subjective:    Patient ID: [name] Weber, female    DOB: 11/23/1931, 81 y.o.   MRN: [id number]  HPI   [name] Weber is back regarding her thoracic myelopathy. She is still working on her gait with therapy. She has been advanced to a straight cane. She uses the cane outside of the house and for longer distances.   She has had improvement in her bladder function.   At home she's doing cooking, cleaning, and general house maintenance. She has driven for short dx without issues during the day.  There is still some sensory loss in the legs. She also complains about some hypersensitivity of the right shin.    Pain Inventory Average Pain 0 Pain Right Now 0 My pain is no pain  In the last 24 hours, has pain interfered with the following? General activity 0 Relation with others 0 Enjoyment of life 0 What TIME of day is your pain at its worst? no pain Sleep (in general) Good  Pain is worse with: no pain Pain improves with: no pain Relief from Meds: no pain  Mobility walk without assistance use a cane how many minutes can you walk? 10 ability to climb steps?  yes do you drive?  yes  Function retired  Neuro/Psych No problems in this area  Prior Studies Any changes since last visit?  no  Physicians involved in your care Any changes since last visit?  no   Family History  Problem Relation Age of Onset  . Hyperlipidemia Mother    History   Social History  . Marital Status: Divorced    Spouse Name: N/A  . Number of Children: N/A  . Years of Education: N/A   Social History Main Topics  . Smoking status: Former Smoker    Quit date: 03/09/1991  . Smokeless tobacco: Not on file  . Alcohol Use: No  . Drug Use: No  . Sexual Activity: Not on file   Other Topics Concern  . None   Social History Narrative   Past Surgical History  Procedure Laterality Date  . Vulvar lesion removal  01/01/2009    She had a 1-cm area of erythema to the right of the urethral  meatus  . Thoracic laminectomy for epidural abscess N/A 11/16/2014    Procedure: THORACIC LAMINECTOMY FOR EPIDURAL ABSCESS;  Surgeon: Karn CassisErnesto M Botero, MD;  Location: [facility];  Service: Neurosurgery;  Laterality: N/A;   Past Medical History  Diagnosis Date  . Paroxysmal atrial flutter   . Palpitations     OCCASSIONAL  . PAC (premature atrial contraction)     ISOLATED  . PVC's (premature ventricular contractions)     ISOLATED  . Malaise   . Fatigue   . Myalgia    BP 114/68 mmHg  Pulse 64  SpO2 96%  Opioid Risk Score:   Fall Risk Score:  `1  Depression screen PHQ 2/9  No flowsheet data found.   Review of Systems  All other systems reviewed and are negative.      Objective:   Physical Exam  Constitutional: She is oriented to person, place, and time. She appears well-developed.  HENT: oral mucosa moist, dentition good. HOH  Head: Normocephalic and atraumatic.  Eyes: Conjunctivae are normal. Pupils are equal, round, and reactive to light.  Neck: Normal range of motion. Neck supple.  Cardiovascular: Normal rate.  Respiratory: Effort normal and breath sounds normal. No respiratory distress. She has no wheezes.  GI: Soft. Bowel sounds are  normal. She exhibits no distension.  Abdomen Non-tender.  Musculoskeletal: She exhibits no edema or tenderness. l Neurological: She is alert and oriented to person, place, and time.  Impaired proprioception in both legs/LT improved in both legs but this has improved. Strength: 4 to 4+/5HF,KE bilaterally and 4+ADF/APF except for Right ADF which is still 4. UE's 5/5. Mild steppage gait. Romberg mildly +. She walked without her cane Skin: Skin is warm and dry. Back incision clean,dry and intact.  Psych: pleasant.      Assessment/Plan:  1. Functional deficits secondary to T 10-12 hematoma with myelopathy and paraplegia  -continue with neuro rehab to advance gait and higher level balance -progress with HEP---water walking would be  ideal. It looks like she has a membership to the [facility] 2. Neurogenic bowel:  -diet regulated  3. Neurogenic bladder:  -back to baseline  4. Afib: mgt per Dr. Tenny Craw 5. Follow up with me prn--she is doing so well that no scheduled follow up is indicated with me. Fifteen minutes of face to face patient care time were spent during this visit. All questions were encouraged and answered.     [name] Oyster, MD, [facility]  [facility]

## 2015-03-27 NOTE — Patient Instructions (Signed)
PLEASE CALL ME WITH ANY PROBLEMS OR QUESTIONS (#[phone]).      

## 2015-03-28 ENCOUNTER — Ambulatory Visit: Payer: Medicare Other | Admitting: Physical Therapy

## 2015-03-28 DIAGNOSIS — M6281 Muscle weakness (generalized): Secondary | ICD-10-CM

## 2015-03-28 DIAGNOSIS — R269 Unspecified abnormalities of gait and mobility: Secondary | ICD-10-CM | POA: Diagnosis not present

## 2015-03-28 NOTE — Therapy (Signed)
[facility] [address] [place], [place], [address] Phone: [phone]   Fax:  [phone]  Physical Therapy Treatment  Patient Details  Name: [name] Weber MRN: [id number] Date of Birth: 10-Sep-1931 Referring Provider:  Marden Noble, MD  Encounter Date: 03/28/2015      PT End of Session - 03/28/15 1912    Visit Number 25   Number of Visits 32   Date for PT Re-Evaluation 05/21/15   Authorization Type Medicare G-code every 10th visit   PT Start Time 1020   PT Stop Time 1103   PT Time Calculation (min) 43 min   Equipment Utilized During Treatment Gait belt   Activity Tolerance Patient tolerated treatment well   Behavior During Therapy [facility] for tasks assessed/performed      Past Medical History  Diagnosis Date  . Paroxysmal atrial flutter   . Palpitations     OCCASSIONAL  . PAC (premature atrial contraction)     ISOLATED  . PVC's (premature ventricular contractions)     ISOLATED  . Malaise   . Fatigue   . Myalgia     Past Surgical History  Procedure Laterality Date  . Vulvar lesion removal  01/01/2009    She had a 1-cm area of erythema to the right of the urethral meatus  . Thoracic laminectomy for epidural abscess N/A 11/16/2014    Procedure: THORACIC LAMINECTOMY FOR EPIDURAL ABSCESS;  Surgeon: Karn Cassis, MD;  Location: [facility];  Service: Neurosurgery;  Laterality: N/A;    There were no vitals filed for this visit.  Visit Diagnosis:  Abnormality of gait  Muscle weakness of lower extremity      Subjective Assessment - 03/28/15 1023    Subjective Saw Dr Riley Kill but no changes.  Denies falls.   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   Currently in Pain? No/denies                         [facility] Adult PT Treatment/Exercise - 03/28/15 0001    Knee/Hip  Exercises: Aerobic   Stationary Bike Scifit level 2.5 all 4 extremities x 5 minutes   Knee/Hip Exercises: Standing   SLS x 10 secs bil LE x 3 reps   Other Standing Knee Exercises bil with 3# weight-hip extension, hamstring curl, hip flexion, hip abduction at counter with intermittent UE support                   Neuro Re-education: Lateral weightshifting>lateral weightshift and lift for 2-3 seconds for improved hip stability in single limb stance with bilateral UE support. Standing semi-tandem position with anterior/posterior weightshifting>weightshift with lifting lower extremities for improved single limb stance and hip stability. Performed on compliant and non-compliant surfaces. Sidestepping, walking on heels, walking on toes at counter with intermittent UE support.  Step up,up,down,down at 8"step with intermittent UE support.  Taps to 1st step, 2nd step, 1st step then floor with intermittent UE support. On ramp: Pt stands on solid surface on incline/decline-Marching x 15 reps, forward step taps x 15 reps, then back step taps x 15 reps, lateral step taps both directions x 15. Standing with on incline with head turns x 10 reps, head nods x 10 reps, with eyes open, then 10 second hold of eyes closed. PT provides min guard assistance for balance.  PT Short Term Goals - 02/18/15 1722    PT SHORT TERM GOAL #1   Title Pt will be independent with HEP for improved strength, balance, and gait.   Status Achieved   PT SHORT TERM GOAL #2   Title Pt will improve  TUG score to less than or equal to 15 seconds for decreased fall risk.   Baseline 14.28 sec with tripod cane; 12.66 sec with no device   Status Achieved   PT SHORT TERM GOAL #3   Title Pt will improve Berg Balance score to at least 31/56 for decreased fall risk.   Baseline 36/56 on 02/18/15   Status Achieved   PT SHORT TERM GOAL #4   Title Pt will perform at least 8 of 10 reps of sit<>stand transfers  with minimal UE support, to demonstrate improved lower extremity strength.   Status Achieved           PT Long Term Goals - 03/22/15 1240    PT LONG TERM GOAL #1   Title Pt will demonstrate independence in progression of HEP to address strength and balance. (Target 04/21/15)   Time 4   Period Weeks   Status New   PT LONG TERM GOAL #2   Title Pt will improve Berg Balance score to at least 49/56 for decreased fall risk.   Baseline Sharlene MottsBerg 44/56   Time 4   Period Weeks   Status New   PT LONG TERM GOAL #3   Title Pt will improve Dynamic Gait Index score to at least 19/24 for decreased fall risk.   Baseline DGI 17/24   Time 4   Status New   PT LONG TERM GOAL #4   Title Pt will ambulate at least 1000 ft indoor and outdoor surfaces no device, for improved funcitonal mobility.   Time 4   Period Weeks   Status New   PT LONG TERM GOAL #5   Title Pt will verbalize understanding of transition to community fitness upon D/C from PT.   Time 4   Period Weeks   Status On-going               Plan - 03/28/15 1913    Clinical Impression Statement Pt continues to be motivated to return to prior functional level.  Continue PT per POC.   Pt will benefit from skilled therapeutic intervention in order to improve on the following deficits Abnormal gait;Decreased balance;Decreased coordination;Decreased mobility;Decreased strength;Difficulty walking   Rehab Potential Good   PT Frequency 3x / week   PT Duration 8 weeks  wk 8 of 8   PT Treatment/Interventions ADLs/Self Care Home Management;Functional mobility training;Stair training;Gait training;DME Instruction;Therapeutic activities;Therapeutic exercise;Balance training;Neuromuscular re-education;Patient/family education   PT Next Visit Plan balance, dynamic gait, strengthening/hip stability   Consulted and Agree with Plan of Care Patient        Problem List Patient Active Problem List   Diagnosis Date Noted  . Anxiety state  12/10/2014  . Neurogenic bladder 11/30/2014  . Neurogenic bowel 11/30/2014  . Bacterial UTI 11/30/2014  . Thoracic myelopathy 11/29/2014  . Persistent atrial fibrillation 11/27/2014  . Traumatic spinal subdural hematoma   . Acute pulmonary embolism   . Orthostatic hypotension 11/22/2014  . Near syncope 11/21/2014  . Paraplegia at T9 level 11/17/2014  . Weakness of both legs   . Nocturnal leg cramps 07/16/2014  . Encounter for therapeutic drug monitoring 12/25/2013  . Malaise and fatigue 06/01/2011  . Atrial flutter 03/10/2011  . Long term (  current) use of anticoagulants 03/10/2011  . Benign hypertensive heart disease without heart failure 03/10/2011  . Hypercholesterolemia 03/10/2011  . Paroxysmal atrial flutter   . Palpitations   . PAC (premature atrial contraction)   . PVC's (premature ventricular contractions)   . Malaise   . Fatigue   . Myalgia     Newell Coral 03/28/2015, 7:15 PM  [facility] [address] [place], [place], [address] Phone: [phone]   Fax:  [phone]   Newell Coral, [facility] 03/28/2015 7:15 PM Phone: [phone] Fax: [phone]

## 2015-04-02 ENCOUNTER — Ambulatory Visit: Payer: Medicare Other | Admitting: Physical Therapy

## 2015-04-03 ENCOUNTER — Ambulatory Visit: Payer: Medicare Other | Attending: Physical Medicine & Rehabilitation | Admitting: Physical Therapy

## 2015-04-03 DIAGNOSIS — R269 Unspecified abnormalities of gait and mobility: Secondary | ICD-10-CM

## 2015-04-03 DIAGNOSIS — R6889 Other general symptoms and signs: Secondary | ICD-10-CM

## 2015-04-03 DIAGNOSIS — M6281 Muscle weakness (generalized): Secondary | ICD-10-CM | POA: Diagnosis not present

## 2015-04-03 NOTE — Therapy (Signed)
[facility] [address] [place], [place], [address] Phone: [phone]   Fax:  [phone]  Physical Therapy Treatment  Patient Details  Name: [name] Weber MRN: [id number] Date of Birth: 09/15/1931 Referring Provider:  Marden NobleGates, Robert, MD  Encounter Date: 04/03/2015      PT End of Session - 04/04/15 0745    Visit Number 26   Number of Visits 32   Date for PT Re-Evaluation 05/21/15   Authorization Type Medicare G-code every 10th visit   PT Start Time 0847   PT Stop Time 0928   PT Time Calculation (min) 41 min   Equipment Utilized During Treatment Gait belt   Activity Tolerance Patient tolerated treatment well   Behavior During Therapy [facility] for tasks assessed/performed      Past Medical History  Diagnosis Date  . Paroxysmal atrial flutter   . Palpitations     OCCASSIONAL  . PAC (premature atrial contraction)     ISOLATED  . PVC's (premature ventricular contractions)     ISOLATED  . Malaise   . Fatigue   . Myalgia     Past Surgical History  Procedure Laterality Date  . Vulvar lesion removal  01/01/2009    She had a 1-cm area of erythema to the right of the urethral meatus  . Thoracic laminectomy for epidural abscess N/A 11/16/2014    Procedure: THORACIC LAMINECTOMY FOR EPIDURAL ABSCESS;  Surgeon: Karn CassisErnesto M Botero, MD;  Location: [facility];  Service: Neurosurgery;  Laterality: N/A;    There were no vitals filed for this visit.  Visit Diagnosis:  Muscle weakness of lower extremity  Postural instability of trunk  Abnormality of gait      Subjective Assessment - 04/03/15 0849    Subjective Walked several times outside yesterday; extending distance every couple of days, with cane   Currently in Pain? No/denies            Therapeutic Exercise: -Hip strengthening at counter, using 3# weight bilaterl lower extremities:  Hip flexion marching, hip abduction, then hamstring curls, 2 sets x 10  reps.  Lateral weightshifting progressing to single limb stance as means to improve hip stability, 3 sets x 10 reps with 2-3 second hold in the air each leg.  Neuro Re-education:  On compliant surface:  Forward, side, back step and weigthshift x 15 reps each leg with UE support; on compliant balance beam:  Tandem stance with eyes open, 3 sets x 10 sconds each position; tandem stance with head turns, nods, eyes closed x 10 seconds; tandem walk forward and back on beam x 3 reps with superivsion.   On compliant red and blue mat surfaces:  Dynamic gait and balance exercises-forward/back walking, forward marching, tandem gait, side stepping, forward crossover steps, back crossover steps, braiding with supervision.  Sidestepping with step taps at cones with min assist for balance.  On compliant surface, sidestep and forward walk change of directions/turns with supervision.                        PT Short Term Goals - 02/18/15 1722    PT SHORT TERM GOAL #1   Title Pt will be independent with HEP for improved strength, balance, and gait.   Status Achieved   PT SHORT TERM GOAL #2   Title Pt will improve  TUG score to less than or equal to 15 seconds for decreased fall risk.   Baseline 14.28 sec with tripod  cane; 12.66 sec with no device   Status Achieved   PT SHORT TERM GOAL #3   Title Pt will improve Berg Balance score to at least 31/56 for decreased fall risk.   Baseline 36/56 on 02/18/15   Status Achieved   PT SHORT TERM GOAL #4   Title Pt will perform at least 8 of 10 reps of sit<>stand transfers with minimal UE support, to demonstrate improved lower extremity strength.   Status Achieved           PT Long Term Goals - 03/22/15 1240    PT LONG TERM GOAL #1   Title Pt will demonstrate independence in progression of HEP to address strength and balance. (Target 04/21/15)   Time 4   Period Weeks   Status New   PT LONG TERM GOAL #2   Title Pt will improve Berg Balance  score to at least 49/56 for decreased fall risk.   Baseline Sharlene Motts 44/56   Time 4   Period Weeks   Status New   PT LONG TERM GOAL #3   Title Pt will improve Dynamic Gait Index score to at least 19/24 for decreased fall risk.   Baseline DGI 17/24   Time 4   Status New   PT LONG TERM GOAL #4   Title Pt will ambulate at least 1000 ft indoor and outdoor surfaces no device, for improved funcitonal mobility.   Time 4   Period Weeks   Status New   PT LONG TERM GOAL #5   Title Pt will verbalize understanding of transition to community fitness upon D/C from PT.   Time 4   Period Weeks   Status On-going               Plan - 04/04/15 0745    Clinical Impression Statement Pt continuing to improve with hip stability and balance activities.  Pt does continue to have difficulty maintaining balance with more challenging activities on compliant surfaces.  Pt will continue to benefit from further skilled PT to address balance, strength and gait progression.   Pt will benefit from skilled therapeutic intervention in order to improve on the following deficits Abnormal gait;Decreased balance;Decreased coordination;Decreased mobility;Decreased strength;Difficulty walking   Rehab Potential Good   PT Frequency 2x / week   PT Duration 4 weeks  wk 2 of 4   PT Treatment/Interventions ADLs/Self Care Home Management;Functional mobility training;Stair training;Gait training;DME Instruction;Therapeutic activities;Therapeutic exercise;Balance training;Neuromuscular re-education;Patient/family education   PT Next Visit Plan balance, dynamic gait, strengthening/hip stability; walking activities without cane        Problem List Patient Active Problem List   Diagnosis Date Noted  . Anxiety state 12/10/2014  . Neurogenic bladder 11/30/2014  . Neurogenic bowel 11/30/2014  . Bacterial UTI 11/30/2014  . Thoracic myelopathy 11/29/2014  . Persistent atrial fibrillation 11/27/2014  . Traumatic spinal  subdural hematoma   . Acute pulmonary embolism   . Orthostatic hypotension 11/22/2014  . Near syncope 11/21/2014  . Paraplegia at T9 level 11/17/2014  . Weakness of both legs   . Nocturnal leg cramps 07/16/2014  . Encounter for therapeutic drug monitoring 12/25/2013  . Malaise and fatigue 06/01/2011  . Atrial flutter 03/10/2011  . Long term (current) use of anticoagulants 03/10/2011  . Benign hypertensive heart disease without heart failure 03/10/2011  . Hypercholesterolemia 03/10/2011  . Paroxysmal atrial flutter   . Palpitations   . PAC (premature atrial contraction)   . PVC's (premature ventricular contractions)   . Malaise   .  Fatigue   . Myalgia     MARRIOTT,AMY W. 04/04/2015, 7:50 AM Gean MaidensMARRIOTT,AMY W., PT  [facility] [address] [place], [place], [address] Phone: [phone]   Fax:  [phone]

## 2015-04-04 ENCOUNTER — Ambulatory Visit: Payer: Medicare Other | Admitting: Physical Therapy

## 2015-04-04 DIAGNOSIS — R269 Unspecified abnormalities of gait and mobility: Secondary | ICD-10-CM | POA: Diagnosis not present

## 2015-04-04 DIAGNOSIS — R6889 Other general symptoms and signs: Secondary | ICD-10-CM

## 2015-04-04 DIAGNOSIS — M6281 Muscle weakness (generalized): Secondary | ICD-10-CM

## 2015-04-04 NOTE — Therapy (Signed)
[facility] [address] [place], [place], [address] Phone: [phone]   Fax:  [phone]  Physical Therapy Treatment  Patient Details  Name: [name] Weber MRN: [id number] Date of Birth: 09/24/1931 Referring Provider:  Marden NobleGates, Robert, MD  Encounter Date: 04/04/2015      PT End of Session - 04/04/15 1156    Visit Number 27   Number of Visits 32   Date for PT Re-Evaluation 05/21/15   Authorization Type Medicare G-code every 10th visit   PT Start Time 1103   PT Stop Time 1143   PT Time Calculation (min) 40 min   Equipment Utilized During Treatment Gait belt   Activity Tolerance Patient tolerated treatment well   Behavior During Therapy [facility] for tasks assessed/performed      Past Medical History  Diagnosis Date  . Paroxysmal atrial flutter   . Palpitations     OCCASSIONAL  . PAC (premature atrial contraction)     ISOLATED  . PVC's (premature ventricular contractions)     ISOLATED  . Malaise   . Fatigue   . Myalgia     Past Surgical History  Procedure Laterality Date  . Vulvar lesion removal  01/01/2009    She had a 1-cm area of erythema to the right of the urethral meatus  . Thoracic laminectomy for epidural abscess N/A 11/16/2014    Procedure: THORACIC LAMINECTOMY FOR EPIDURAL ABSCESS;  Surgeon: Karn CassisErnesto M Botero, MD;  Location: [facility];  Service: Neurosurgery;  Laterality: N/A;    There were no vitals filed for this visit.  Visit Diagnosis:  Muscle weakness of lower extremity  Postural instability of trunk  Abnormality of gait      Subjective Assessment - 04/04/15 1105    Subjective Walked several times outside yesterday with cane; no difficulty.   Currently in Pain? No/denies               Therapeutic Exercise: -Hip strengthening at counter, using 3# weight bilateral lower extremities: Hip flexion marching, hip abduction, then hamstring curls, 2 sets x 15 reps. Cues for  posture to prevent lean during exercises.  Neuro Re-education:  On thick foam compliant surface: Forward, side, back step and weigthshift x 15 reps each leg with UE support; on compliant balance beam: Tandem stance with eyes open, 3 sets x 15 sconds each position; tandem stance with head turns, nods, eyes closed x 10 seconds; tandem walk forward and back on beam x 3 reps with supervision, side step along balance beam 4 reps each direction with UE support.    Gait: -Obstacle negotiation stepping over and around obstacles/balance beam with min assist, cues for deliberate stepping to maintain balance in obstacle course.  Gait carrying 1-2 weighted objects x 500 ft., including steps with and without rails. Pt ambulates with bilateral internal rotation, and she experiences 1 episode of R foot catching while carrying objects.                         PT Short Term Goals - 02/18/15 1722    PT SHORT TERM GOAL #1   Title Pt will be independent with HEP for improved strength, balance, and gait.   Status Achieved   PT SHORT TERM GOAL #2   Title Pt will improve  TUG score to less than or equal to 15 seconds for decreased fall risk.   Baseline 14.28 sec with tripod cane; 12.66 sec with no device  Status Achieved   PT SHORT TERM GOAL #3   Title Pt will improve Berg Balance score to at least 31/56 for decreased fall risk.   Baseline 36/56 on 02/18/15   Status Achieved   PT SHORT TERM GOAL #4   Title Pt will perform at least 8 of 10 reps of sit<>stand transfers with minimal UE support, to demonstrate improved lower extremity strength.   Status Achieved           PT Long Term Goals - 03/22/15 1240    PT LONG TERM GOAL #1   Title Pt will demonstrate independence in progression of HEP to address strength and balance. (Target 04/21/15)   Time 4   Period Weeks   Status New   PT LONG TERM GOAL #2   Title Pt will improve Berg Balance score to at least 49/56 for decreased fall risk.    Baseline Sharlene MottsBerg 44/56   Time 4   Period Weeks   Status New   PT LONG TERM GOAL #3   Title Pt will improve Dynamic Gait Index score to at least 19/24 for decreased fall risk.   Baseline DGI 17/24   Time 4   Status New   PT LONG TERM GOAL #4   Title Pt will ambulate at least 1000 ft indoor and outdoor surfaces no device, for improved funcitonal mobility.   Time 4   Period Weeks   Status New   PT LONG TERM GOAL #5   Title Pt will verbalize understanding of transition to community fitness upon D/C from PT.   Time 4   Period Weeks   Status On-going               Plan - 04/04/15 1157    Pt will benefit from skilled therapeutic intervention in order to improve on the following deficits Abnormal gait;Decreased balance;Decreased coordination;Decreased mobility;Decreased strength;Difficulty walking   Rehab Potential Good   PT Frequency 2x / week   PT Duration 4 weeks  wk 2 of 4   PT Treatment/Interventions ADLs/Self Care Home Management;Functional mobility training;Stair training;Gait training;DME Instruction;Therapeutic activities;Therapeutic exercise;Balance training;Neuromuscular re-education;Patient/family education   PT Next Visit Plan balance, dynamic gait, strengthening/hip stability; walking activities without cane   Consulted and Agree with Plan of Care Patient        Problem List Patient Active Problem List   Diagnosis Date Noted  . Anxiety state 12/10/2014  . Neurogenic bladder 11/30/2014  . Neurogenic bowel 11/30/2014  . Bacterial UTI 11/30/2014  . Thoracic myelopathy 11/29/2014  . Persistent atrial fibrillation 11/27/2014  . Traumatic spinal subdural hematoma   . Acute pulmonary embolism   . Orthostatic hypotension 11/22/2014  . Near syncope 11/21/2014  . Paraplegia at T9 level 11/17/2014  . Weakness of both legs   . Nocturnal leg cramps 07/16/2014  . Encounter for therapeutic drug monitoring 12/25/2013  . Malaise and fatigue 06/01/2011  . Atrial  flutter 03/10/2011  . Long term (current) use of anticoagulants 03/10/2011  . Benign hypertensive heart disease without heart failure 03/10/2011  . Hypercholesterolemia 03/10/2011  . Paroxysmal atrial flutter   . Palpitations   . PAC (premature atrial contraction)   . PVC's (premature ventricular contractions)   . Malaise   . Fatigue   . Myalgia     Makenli Derstine W. 04/04/2015, 11:59 AM  Gean MaidensMARRIOTT,Rumeal Cullipher W., PT  [facility] [address] [place], [place], [address] Phone: [phone]   Fax:  [phone]

## 2015-04-09 ENCOUNTER — Ambulatory Visit: Payer: Medicare Other | Admitting: Physical Therapy

## 2015-04-09 DIAGNOSIS — M6281 Muscle weakness (generalized): Secondary | ICD-10-CM

## 2015-04-09 DIAGNOSIS — R269 Unspecified abnormalities of gait and mobility: Secondary | ICD-10-CM | POA: Diagnosis not present

## 2015-04-09 NOTE — Patient Instructions (Signed)
ANKLE: Dorsiflexion (Band)   Sit at edge of surface. Place band around top of right foot. Keeping heel on floor, raise toes of banded foot. Hold 2 seconds. Use yellow band. 10 reps per set, 3 sets per day.  Copyright  VHI. All rights reserved.

## 2015-04-09 NOTE — Therapy (Signed)
[facility] [address] [place], [place], [address] Phone: [phone]   Fax:  [phone]  Physical Therapy Treatment  Patient Details  Name: [name] Policevelyn C Rakestraw MRN: [id number] Date of Birth: 12/16/1930 Referring Provider:  Marden NobleGates, Robert, MD  Encounter Date: 04/09/2015      PT End of Session - 04/09/15 1345    Visit Number 28   Number of Visits 32   Date for PT Re-Evaluation 05/21/15   Authorization Type Medicare G-code every 10th visit   PT Start Time 1105   PT Stop Time 1147   PT Time Calculation (min) 42 min   Equipment Utilized During Treatment Gait belt   Activity Tolerance Patient tolerated treatment well   Behavior During Therapy [facility] for tasks assessed/performed      Past Medical History  Diagnosis Date  . Paroxysmal atrial flutter   . Palpitations     OCCASSIONAL  . PAC (premature atrial contraction)     ISOLATED  . PVC's (premature ventricular contractions)     ISOLATED  . Malaise   . Fatigue   . Myalgia     Past Surgical History  Procedure Laterality Date  . Vulvar lesion removal  01/01/2009    She had a 1-cm area of erythema to the right of the urethral meatus  . Thoracic laminectomy for epidural abscess N/A 11/16/2014    Procedure: THORACIC LAMINECTOMY FOR EPIDURAL ABSCESS;  Surgeon: Karn CassisErnesto M Botero, MD;  Location: [facility];  Service: Neurosurgery;  Laterality: N/A;    There were no vitals filed for this visit.  Visit Diagnosis:  Muscle weakness of lower extremity      Subjective Assessment - 04/09/15 1109    Subjective Walked several times yesterday with cane.  Feels tired today.   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   Currently in Pain? No/denies                         [facility] Treatment/Exercise - 04/09/15 1126    Lumbar  Exercises: Quadruped   Straight Leg Raise 15 reps;4 seconds   Straight Leg Raises Limitations cues for abdominal activation and neutral spine   Knee/Hip Exercises: Standing   Other Standing Knee Exercises bil with 3# weight-hip extension, hamstring curl, hip flexion, hip abduction, SLR at counter with intermittent UE support-all x 15 x 2 sets   Knee/Hip Exercises: Supine   Bridges Right;Both;15 reps   Other Supine Knee Exercises Bridge with hip abduction/adduction and bridge with bil march-all x 15 x 2 sets   Knee/Hip Exercises: Sidelying   Hip ABduction Strengthening;Right;2 sets;15 reps;Other (comment)  2# weight   Clams red theraband 2 sets x 15 reps   Other Sidelying Knee Exercises R hip extension with 2# weight x 15 x 2 sets   Ankle Exercises: Seated   Toe Raise 10 reps;Other (comment)  with yellow theraband on RLE                PT Education - 04/09/15 1344    Education provided Yes   Education Details HEP   Person(s) Educated Patient   Methods Explanation;Demonstration;Handout   Comprehension Verbalized understanding          PT Short Term Goals - 02/18/15 1722    PT SHORT TERM GOAL #1   Title Pt will be independent with  HEP for improved strength, balance, and gait.   Status Achieved   PT SHORT TERM GOAL #2   Title Pt will improve  TUG score to less than or equal to 15 seconds for decreased fall risk.   Baseline 14.28 sec with tripod cane; 12.66 sec with no device   Status Achieved   PT SHORT TERM GOAL #3   Title Pt will improve Berg Balance score to at least 31/56 for decreased fall risk.   Baseline 36/56 on 02/18/15   Status Achieved   PT SHORT TERM GOAL #4   Title Pt will perform at least 8 of 10 reps of sit<>stand transfers with minimal UE support, to demonstrate improved lower extremity strength.   Status Achieved           PT Long Term Goals - 03/22/15 1240    PT LONG TERM GOAL #1   Title Pt will demonstrate independence in progression of HEP  to address strength and balance. (Target 04/21/15)   Time 4   Period Weeks   Status New   PT LONG TERM GOAL #2   Title Pt will improve Berg Balance score to at least 49/56 for decreased fall risk.   Baseline Sharlene MottsBerg 44/56   Time 4   Period Weeks   Status New   PT LONG TERM GOAL #3   Title Pt will improve Dynamic Gait Index score to at least 19/24 for decreased fall risk.   Baseline DGI 17/24   Time 4   Status New   PT LONG TERM GOAL #4   Title Pt will ambulate at least 1000 ft indoor and outdoor surfaces no device, for improved funcitonal mobility.   Time 4   Period Weeks   Status New   PT LONG TERM GOAL #5   Title Pt will verbalize understanding of transition to community fitness upon D/C from PT.   Time 4   Period Weeks   Status On-going               Plan - 04/09/15 1345    Clinical Impression Statement Pt continues with hip weakness and decreased balance.  Motivated to increase strength and mobility.  Continue PT per POC.   Pt will benefit from skilled therapeutic intervention in order to improve on the following deficits Abnormal gait;Decreased balance;Decreased coordination;Decreased mobility;Decreased strength;Difficulty walking   Rehab Potential Good   PT Frequency 2x / week   PT Duration 4 weeks   PT Treatment/Interventions ADLs/Self Care Home Management;Functional mobility training;Stair training;Gait training;DME Instruction;Therapeutic activities;Therapeutic exercise;Balance training;Neuromuscular re-education;Patient/family education   PT Next Visit Plan strengthening, gait without cane, dynamic balance   Consulted and Agree with Plan of Care Patient        Problem List Patient Active Problem List   Diagnosis Date Noted  . Anxiety state 12/10/2014  . Neurogenic bladder 11/30/2014  . Neurogenic bowel 11/30/2014  . Bacterial UTI 11/30/2014  . Thoracic myelopathy 11/29/2014  . Persistent atrial fibrillation 11/27/2014  . Traumatic spinal subdural  hematoma   . Acute pulmonary embolism   . Orthostatic hypotension 11/22/2014  . Near syncope 11/21/2014  . Paraplegia at T9 level 11/17/2014  . Weakness of both legs   . Nocturnal leg cramps 07/16/2014  . Encounter for therapeutic drug monitoring 12/25/2013  . Malaise and fatigue 06/01/2011  . Atrial flutter 03/10/2011  . Long term (current) use of anticoagulants 03/10/2011  . Benign hypertensive heart disease without heart failure 03/10/2011  . Hypercholesterolemia 03/10/2011  . Paroxysmal atrial  flutter   . Palpitations   . PAC (premature atrial contraction)   . PVC's (premature ventricular contractions)   . Malaise   . Fatigue   . Myalgia     Newell Coral 04/09/2015, 1:47 PM  [facility] [address] [place], [place], [address] Phone: [phone]   Fax:  [phone]     Newell Coral, [place] [facility] 04/09/2015 1:47 PM Phone: [phone] Fax: [phone]

## 2015-04-10 ENCOUNTER — Ambulatory Visit: Payer: Medicare Other | Admitting: Physical Therapy

## 2015-04-10 DIAGNOSIS — R6889 Other general symptoms and signs: Secondary | ICD-10-CM

## 2015-04-10 DIAGNOSIS — R269 Unspecified abnormalities of gait and mobility: Secondary | ICD-10-CM

## 2015-04-11 ENCOUNTER — Ambulatory Visit: Payer: Medicare Other | Admitting: Physical Therapy

## 2015-04-11 NOTE — Therapy (Signed)
[facility] [address] [place], [place], [address] Phone: [phone]   Fax:  [phone]  Physical Therapy Treatment  Patient Details  Name: [name] Weber MRN: [id number] Date of Birth: 12/12/1930 Referring Provider:  Marden NobleGates, Robert, MD  Encounter Date: 04/10/2015      PT End of Session - 04/11/15 1706    Visit Number 29   Number of Visits 32   Date for PT Re-Evaluation 05/21/15   Authorization Type Medicare G-code every 10th visit   PT Start Time 1150   PT Stop Time 1230   PT Time Calculation (min) 40 min   Equipment Utilized During Treatment Gait belt   Activity Tolerance Patient tolerated treatment well   Behavior During Therapy [facility] for tasks assessed/performed      Past Medical History  Diagnosis Date  . Paroxysmal atrial flutter   . Palpitations     OCCASSIONAL  . PAC (premature atrial contraction)     ISOLATED  . PVC's (premature ventricular contractions)     ISOLATED  . Malaise   . Fatigue   . Myalgia     Past Surgical History  Procedure Laterality Date  . Vulvar lesion removal  01/01/2009    She had a 1-cm area of erythema to the right of the urethral meatus  . Thoracic laminectomy for epidural abscess N/A 11/16/2014    Procedure: THORACIC LAMINECTOMY FOR EPIDURAL ABSCESS;  Surgeon: Karn CassisErnesto M Botero, MD;  Location: [facility];  Service: Neurosurgery;  Laterality: N/A;    There were no vitals filed for this visit.  Visit Diagnosis:  Postural instability of trunk  Abnormality of gait      Subjective Assessment - 04/11/15 1658    Subjective [name] really worked my legs out very hard yesterday.   Currently in Pain? No/denies      Neuro Re-education: Forward step ups x 15 reps bilateral, then side step ups x 15 reps bilateral, then forward step downs bilateral from 6" step with minimal UE support.  Standing on incline/decline marching in place, forward step taps x 15 reps each with  min guard assistance.  Standing on incline/decline with eyes open/eyes closed with head turns and head nods x 10 reps each with min guard assistance.  In corner, worked on balance strategies with heel/toe raises x 20 reps, then hip/ankle strategy x 20 reps.    -In parallel bars standing on rockerboard-anterior/posterior weightshifting x 20 reps for improved hip/ankle strategy for balance, balancing on rockerboard with UE reaches, arm swing, and ball toss/catch.  Gait:  Gait activities x 350 ft in gym with no device, with narrow base of support, increased bilateral lower extremity external rotation.  Pt requires supervision and cues for improved step length and to widen base of support.  Resisted gait activities x 230 ft, 2 reps, with occasional stops with balance perturbations.  Practiced standing balance resisting perturbations from varied directions.  When doing this pt tends to have increased lumbar lordosis and gain stability through anterior hip ligaments.  Practiced seated and standing pelvic tilts for more neutral posture and abdominal activation in standing to resist perturbations.                              PT Short Term Goals - 02/18/15 1722    PT SHORT TERM GOAL #1   Title Pt will be independent with HEP for improved strength, balance, and gait.  Status Achieved   PT SHORT TERM GOAL #2   Title Pt will improve  TUG score to less than or equal to 15 seconds for decreased fall risk.   Baseline 14.28 sec with tripod cane; 12.66 sec with no device   Status Achieved   PT SHORT TERM GOAL #3   Title Pt will improve Berg Balance score to at least 31/56 for decreased fall risk.   Baseline 36/56 on 02/18/15   Status Achieved   PT SHORT TERM GOAL #4   Title Pt will perform at least 8 of 10 reps of sit<>stand transfers with minimal UE support, to demonstrate improved lower extremity strength.   Status Achieved           PT Long Term Goals - 03/22/15 1240    PT  LONG TERM GOAL #1   Title Pt will demonstrate independence in progression of HEP to address strength and balance. (Target 04/21/15)   Time 4   Period Weeks   Status New   PT LONG TERM GOAL #2   Title Pt will improve Berg Balance score to at least 49/56 for decreased fall risk.   Baseline Sharlene MottsBerg 44/56   Time 4   Period Weeks   Status New   PT LONG TERM GOAL #3   Title Pt will improve Dynamic Gait Index score to at least 19/24 for decreased fall risk.   Baseline DGI 17/24   Time 4   Status New   PT LONG TERM GOAL #4   Title Pt will ambulate at least 1000 ft indoor and outdoor surfaces no device, for improved funcitonal mobility.   Time 4   Period Weeks   Status New   PT LONG TERM GOAL #5   Title Pt will verbalize understanding of transition to community fitness upon D/C from PT.   Time 4   Period Weeks   Status On-going               Plan - 04/11/15 1707    Clinical Impression Statement Continues to be motivated for therapy.  Pt will continue to benefit from further skilled PT to address hip strength, stability and balance for improved gait.   Pt will benefit from skilled therapeutic intervention in order to improve on the following deficits Abnormal gait;Decreased balance;Decreased coordination;Decreased mobility;Decreased strength;Difficulty walking   Rehab Potential Good   PT Frequency 2x / week   PT Duration 4 weeks   PT Treatment/Interventions ADLs/Self Care Home Management;Functional mobility training;Stair training;Gait training;DME Instruction;Therapeutic activities;Therapeutic exercise;Balance training;Neuromuscular re-education;Patient/family education   PT Next Visit Plan strengthening, gait without cane, dynamic balance; GCODE NEXT VISIT   Consulted and Agree with Plan of Care Patient        Problem List Patient Active Problem List   Diagnosis Date Noted  . Anxiety state 12/10/2014  . Neurogenic bladder 11/30/2014  . Neurogenic bowel 11/30/2014  .  Bacterial UTI 11/30/2014  . Thoracic myelopathy 11/29/2014  . Persistent atrial fibrillation 11/27/2014  . Traumatic spinal subdural hematoma   . Acute pulmonary embolism   . Orthostatic hypotension 11/22/2014  . Near syncope 11/21/2014  . Paraplegia at T9 level 11/17/2014  . Weakness of both legs   . Nocturnal leg cramps 07/16/2014  . Encounter for therapeutic drug monitoring 12/25/2013  . Malaise and fatigue 06/01/2011  . Atrial flutter 03/10/2011  . Long term (current) use of anticoagulants 03/10/2011  . Benign hypertensive heart disease without heart failure 03/10/2011  . Hypercholesterolemia 03/10/2011  . Paroxysmal atrial  flutter   . Palpitations   . PAC (premature atrial contraction)   . PVC's (premature ventricular contractions)   . Malaise   . Fatigue   . Myalgia     MARRIOTT,AMY W. 04/11/2015, 5:09 PM  Gean Maidens., PT  [facility] [address] [place], [place], [address] Phone: [phone]   Fax:  [phone]

## 2015-04-16 ENCOUNTER — Ambulatory Visit: Payer: Medicare Other | Admitting: Physical Therapy

## 2015-04-17 ENCOUNTER — Ambulatory Visit: Payer: Medicare Other | Admitting: Physical Therapy

## 2015-04-17 DIAGNOSIS — R269 Unspecified abnormalities of gait and mobility: Secondary | ICD-10-CM | POA: Diagnosis not present

## 2015-04-17 NOTE — Therapy (Signed)
[place] [address] [place], [place], [address] Phone: [phone]   Fax:  [phone]  Physical Therapy Treatment  Patient Details  Name: [name] Weber MRN: [id number] Date of Birth: 12/09/1930 Referring Provider:  Josetta Huddle, MD  Encounter Date: 04/17/2015      PT End of Session - 04/17/15 1901    Visit Number 30   Number of Visits 32   Date for PT Re-Evaluation 05/21/15   Authorization Type Medicare G-code every 10th visit   PT Start Time 1154   PT Stop Time 1232   PT Time Calculation (min) 38 min   Equipment Utilized During Treatment Gait belt   Activity Tolerance Patient tolerated treatment well   Behavior During Therapy [facility] for tasks assessed/performed      Past Medical History  Diagnosis Date  . Paroxysmal atrial flutter   . Palpitations     OCCASSIONAL  . PAC (premature atrial contraction)     ISOLATED  . PVC's (premature ventricular contractions)     ISOLATED  . Malaise   . Fatigue   . Myalgia     Past Surgical History  Procedure Laterality Date  . Vulvar lesion removal  01/01/2009    She had a 1-cm area of erythema to the right of the urethral meatus  . Thoracic laminectomy for epidural abscess N/A 11/16/2014    Procedure: THORACIC LAMINECTOMY FOR EPIDURAL ABSCESS;  Surgeon: Floyce Stakes, MD;  Location: [place] [facility];  Service: Neurosurgery;  Laterality: N/A;    There were no vitals filed for this visit.  Visit Diagnosis:  Abnormality of gait      Subjective Assessment - 04/17/15 1159    Subjective Feel like this may be a good time to transition to Silver [employer]ne[facility] at [facility] after therapy ends.   Currently in Pain? No/denies      Gait activities x 1000 ft, indoor and outdoor surfaces, no device modified independently, including curb and incline negotiation, no loss of balance noted.  Pt slows appropriately on inclines and declines.  Gait activities on compliant surfaces  indoors simulating grassy and uneven surfaces, with supervision.  Pt takes appropriate small, slow steps to negotiate uneven surfaces.  Stepping around and over obstacles on compliant and solid surfaces, forward stepping over hurdles, with pt requiring minimal assistance for hurdle negotiation.  Neuro Re-education: -Standing on foam balance surface:  Sidestepping, tandem gait and tandem standing with supervision. -Standing on high foam surface:  Marching x 10, forward kicks x 10, forward step taps x 10 with UE support.                           PT Education - 04/17/15 1900    Education provided Yes   Education Details Self Care provided:  plan of care, plans for discharge next week; discussed community fitness options upon D/C   Person(s) Educated Patient   Methods Explanation   Comprehension Verbalized understanding          PT Short Term Goals - 02/18/15 1722    PT SHORT TERM GOAL #1   Title Pt will be independent with HEP for improved strength, balance, and gait.   Status Achieved   PT SHORT TERM GOAL #2   Title Pt will improve  TUG score to less than or equal to 15 seconds for decreased fall risk.   Baseline 14.28 sec with tripod cane; 12.66 sec with no device  Status Achieved   PT SHORT TERM GOAL #3   Title Pt will improve Berg Balance score to at least 31/56 for decreased fall risk.   Baseline 36/56 on 02/18/15   Status Achieved   PT SHORT TERM GOAL #4   Title Pt will perform at least 8 of 10 reps of sit<>stand transfers with minimal UE support, to demonstrate improved lower extremity strength.   Status Achieved           PT Long Term Goals - 05/17/2015 1904    PT LONG TERM GOAL #1   Title Pt will demonstrate independence in progression of HEP to address strength and balance. (Target 04/21/15)   Time 4   Period Weeks   Status New   PT LONG TERM GOAL #2   Title Pt will improve Berg Balance score to at least 49/56 for decreased fall risk.    Baseline Merrilee Jansky 44/56   Time 4   Period Weeks   Status New   PT LONG TERM GOAL #3   Title Pt will improve Dynamic Gait Index score to at least 19/24 for decreased fall risk.   Baseline DGI 17/24   Time 4   Period Weeks   Status New   PT LONG TERM GOAL #4   Title Pt will ambulate at least 1000 ft indoor and outdoor surfaces no device, for improved funcitonal mobility.   Status Achieved   PT LONG TERM GOAL #5   Title Pt will verbalize understanding of transition to community fitness upon D/C from PT.   Status Achieved               Plan - 05/17/15 1902    Clinical Impression Statement Pt has met LTG #4 and 5.  Pt appears on target for meeting remaining goals by next week.  She is on target for discharge plans for next week.  She has made significant progress with strength, balance and gait progression from walker to no assistive device, except cane for most outdoor surfaces.  She continues to demonstrate decreased hip stability, decreased single limb stance and would continue to benefit from further skilled PT to progress/update HEP as needed.   Pt will benefit from skilled therapeutic intervention in order to improve on the following deficits Abnormal gait;Decreased balance;Decreased coordination;Decreased mobility;Decreased strength;Difficulty walking   Rehab Potential Good   PT Frequency 2x / week   PT Duration 4 weeks   PT Treatment/Interventions ADLs/Self Care Home Management;Functional mobility training;Stair training;Gait training;DME Instruction;Therapeutic activities;Therapeutic exercise;Balance training;Neuromuscular re-education;Patient/family education   PT Next Visit Plan plans for D/C next week; check HEP for hip strengthening and discuss HEP progression   Consulted and Agree with Plan of Care Patient          G-Codes - May 17, 2015 1905    Functional Assessment Tool Used gait 1000 ft outdoor and indoor surfaces no device   Functional Limitation Mobility: Walking and  moving around   Mobility: Walking and Moving Around Current Status [phone]) At least 1 percent but less than 20 percent impaired, limited or restricted   Mobility: Walking and Moving Around Goal Status [phone]) At least 1 percent but less than 20 percent impaired, limited or restricted      Problem List Patient Active Problem List   Diagnosis Date Noted  . Anxiety state 12/10/2014  . Neurogenic bladder 11/30/2014  . Neurogenic bowel 11/30/2014  . Bacterial UTI 11/30/2014  . Thoracic myelopathy 11/29/2014  . Persistent atrial fibrillation 11/27/2014  . Traumatic spinal subdural  hematoma   . Acute pulmonary embolism   . Orthostatic hypotension 11/22/2014  . Near syncope 11/21/2014  . Paraplegia at T9 level 11/17/2014  . Weakness of both legs   . Nocturnal leg cramps 07/16/2014  . Encounter for therapeutic drug monitoring 12/25/2013  . Malaise and fatigue 06/01/2011  . Atrial flutter 03/10/2011  . Long term (current) use of anticoagulants 03/10/2011  . Benign hypertensive heart disease without heart failure 03/10/2011  . Hypercholesterolemia 03/10/2011  . Paroxysmal atrial flutter   . Palpitations   . PAC (premature atrial contraction)   . PVC's (premature ventricular contractions)   . Malaise   . Fatigue   . Myalgia     MARRIOTT,AMY W. 04/17/2015, 7:06 PM Frazier Butt., PT [place] [address] [place], [place], [address] Phone: [phone]   Fax:  [phone]

## 2015-04-18 ENCOUNTER — Ambulatory Visit: Payer: Medicare Other | Admitting: Physical Therapy

## 2015-04-18 DIAGNOSIS — R269 Unspecified abnormalities of gait and mobility: Secondary | ICD-10-CM | POA: Diagnosis not present

## 2015-04-18 DIAGNOSIS — M6281 Muscle weakness (generalized): Secondary | ICD-10-CM

## 2015-04-21 NOTE — Therapy (Signed)
[facility] [address] [place], [place], [address] Phone: [phone]   Fax:  [phone]  Physical Therapy Treatment  Patient Details  Name: [name] Weber MRN: [id number] Date of Birth: September 24, 1931 Referring Provider:  Marden Noble, MD  Encounter Date: 04/18/2015      PT End of Session - 04/21/15 1946    Visit Number 31   Number of Visits 32   Date for PT Re-Evaluation 05/21/15   Authorization Type Medicare G-code every 10th visit   PT Start Time 0847   PT Stop Time 0930   PT Time Calculation (min) 43 min   Equipment Utilized During Treatment Gait belt   Activity Tolerance Patient tolerated treatment well   Behavior During Therapy [facility] for tasks assessed/performed      Past Medical History  Diagnosis Date  . Paroxysmal atrial flutter   . Palpitations     OCCASSIONAL  . PAC (premature atrial contraction)     ISOLATED  . PVC's (premature ventricular contractions)     ISOLATED  . Malaise   . Fatigue   . Myalgia     Past Surgical History  Procedure Laterality Date  . Vulvar lesion removal  01/01/2009    She had a 1-cm area of erythema to the right of the urethral meatus  . Thoracic laminectomy for epidural abscess N/A 11/16/2014    Procedure: THORACIC LAMINECTOMY FOR EPIDURAL ABSCESS;  Surgeon: Karn Cassis, MD;  Location: [facility];  Service: Neurosurgery;  Laterality: N/A;    There were no vitals filed for this visit.  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   Currently in Pain? No/denies                         [facility] Adult PT Treatment/Exercise - 04/21/15 1945    Knee/Hip Exercises: Machines for  Strengthening   Cybex Knee Extension bil le x 20 x 2 at 50#;RLE only 30# x 20 x 2 working on R knee control   Knee/Hip Exercises: Standing   Other Standing Knee Exercises RLE with 3# weight for hip extension, hamstring curl, marching, hip abduction with UE support   Other Standing Knee Exercises standing a steps for lateral and forward step ups x 20 bil LE with intermitent UE support   Ankle Exercises: Aerobic   Elliptical level 1.5 x 2 minutes forward                  PT Short Term Goals - 02/18/15 1722    PT SHORT TERM GOAL #1   Title Pt will be independent with HEP for improved strength, balance, and gait.   Status Achieved   PT SHORT TERM GOAL #2   Title Pt will improve  TUG score to less than or equal to 15 seconds for decreased fall risk.   Baseline 14.28 sec with tripod cane; 12.66 sec with no device   Status Achieved   PT SHORT TERM GOAL #3   Title Pt will improve Berg Balance score to at least 31/56 for decreased fall risk.   Baseline 36/56 on 02/18/15   Status Achieved   PT SHORT TERM GOAL #4  Title Pt will perform at least 8 of 10 reps of sit<>stand transfers with minimal UE support, to demonstrate improved lower extremity strength.   Status Achieved           PT Long Term Goals - 04/17/15 1904    PT LONG TERM GOAL #1   Title Pt will demonstrate independence in progression of HEP to address strength and balance. (Target 04/21/15)   Time 4   Period Weeks   Status New   PT LONG TERM GOAL #2   Title Pt will improve Berg Balance score to at least 49/56 for decreased fall risk.   Baseline Sharlene MottsBerg 44/56   Time 4   Period Weeks   Status New   PT LONG TERM GOAL #3   Title Pt will improve Dynamic Gait Index score to at least 19/24 for decreased fall risk.   Baseline DGI 17/24   Time 4   Period Weeks   Status New   PT LONG TERM GOAL #4   Title Pt will ambulate at least 1000 ft indoor and outdoor surfaces no device, for improved funcitonal mobility.   Status  Achieved   PT LONG TERM GOAL #5   Title Pt will verbalize understanding of transition to community fitness upon D/C from PT.   Status Achieved               Plan - 04/21/15 1947    Clinical Impression Statement Continue PT per POC with planned d/c next week per Lonia BloodAmy Marriott, PT.   Pt will benefit from skilled therapeutic intervention in order to improve on the following deficits Abnormal gait;Decreased balance;Decreased coordination;Decreased mobility;Decreased strength;Difficulty walking   Rehab Potential Good   PT Frequency 2x / week   PT Duration 4 weeks   PT Treatment/Interventions ADLs/Self Care Home Management;Functional mobility training;Stair training;Gait training;DME Instruction;Therapeutic activities;Therapeutic exercise;Balance training;Neuromuscular re-education;Patient/family education   PT Next Visit Plan begin checking goals and review HEP    Consulted and Agree with Plan of Care Patient        Problem List Patient Active Problem List   Diagnosis Date Noted  . Anxiety state 12/10/2014  . Neurogenic bladder 11/30/2014  . Neurogenic bowel 11/30/2014  . Bacterial UTI 11/30/2014  . Thoracic myelopathy 11/29/2014  . Persistent atrial fibrillation 11/27/2014  . Traumatic spinal subdural hematoma   . Acute pulmonary embolism   . Orthostatic hypotension 11/22/2014  . Near syncope 11/21/2014  . Paraplegia at T9 level 11/17/2014  . Weakness of both legs   . Nocturnal leg cramps 07/16/2014  . Encounter for therapeutic drug monitoring 12/25/2013  . Malaise and fatigue 06/01/2011  . Atrial flutter 03/10/2011  . Long term (current) use of anticoagulants 03/10/2011  . Benign hypertensive heart disease without heart failure 03/10/2011  . Hypercholesterolemia 03/10/2011  . Paroxysmal atrial flutter   . Palpitations   . PAC (premature atrial contraction)   . PVC's (premature ventricular contractions)   . Malaise   . Fatigue   . Myalgia     Newell CoralRobertson, Shomari Scicchitano  Terry 04/21/2015, 7:49 PM  [facility] [address] [place], [place], [address] Phone: [phone]   Fax:  [phone]    Newell CoralDenise Terry Vale M[place], [place] [facility] 04/21/2015 7:49 PM Phone: [phone] Fax: [phone]

## 2015-04-23 ENCOUNTER — Ambulatory Visit: Payer: Medicare Other | Admitting: Physical Therapy

## 2015-04-23 DIAGNOSIS — R269 Unspecified abnormalities of gait and mobility: Secondary | ICD-10-CM

## 2015-04-24 NOTE — Therapy (Signed)
[place] [address] [place], [place], [address] Phone: [phone]   Fax:  [phone]  Physical Therapy Treatment  Patient Details  Name: [name] Weber MRN: [id number] Date of Birth: 01/26/1931 Referring Provider:  Josetta Huddle, MD  Encounter Date: 04/23/2015      PT End of Session - 04/24/15 1049    Visit Number 32   Number of Visits 32   Date for PT Re-Evaluation 05/21/15   Authorization Type Medicare G-code every 10th visit   PT Start Time 1319   PT Stop Time 1402   PT Time Calculation (min) 43 min   Equipment Utilized During Treatment Gait belt   Activity Tolerance Patient tolerated treatment well   Behavior During Therapy [facility] for tasks assessed/performed      Past Medical History  Diagnosis Date  . Paroxysmal atrial flutter   . Palpitations     OCCASSIONAL  . PAC (premature atrial contraction)     ISOLATED  . PVC's (premature ventricular contractions)     ISOLATED  . Malaise   . Fatigue   . Myalgia     Past Surgical History  Procedure Laterality Date  . Vulvar lesion removal  01/01/2009    She had a 1-cm area of erythema to the right of the urethral meatus  . Thoracic laminectomy for epidural abscess N/A 11/16/2014    Procedure: THORACIC LAMINECTOMY FOR EPIDURAL ABSCESS;  Surgeon: Floyce Stakes, MD;  Location: [place] [facility];  Service: Neurosurgery;  Laterality: N/A;    There were no vitals filed for this visit.  Visit Diagnosis:  Abnormality of gait      Subjective Assessment - 04/23/15 1319    Subjective Walked 30 minutes outside yesterday.  Drove to get car fixed today.   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   Currently in Pain? No/denies     Therapeutic Exercise-reviewed all HEP given to pt during her sessions here with PT and revised her  program. Instructed pt to continue with sit<>stand, tandem walking, bridge-regular, abduction, marching and SLR, SLS, tandem stance, tandem marching, standing hip abduction, standing hip flexion, mini squats, hamstring stretch, dorsiflexion with yellow band, corner balance exercises on non compliant with EO, EC and feet apart, feet together, tandem, Rock and Lift at [employer] Exercise) and staggered stance rock and march.  Pt able to return demonstration of all of the above and highlighted exercises to continue on her handouts from previous sessions.      [place] Adult PT Treatment/Exercise - 04/23/15 1348    Berg Balance Test   Sit to Stand Able to stand  independently using hands   Standing Unsupported Able to stand safely 2 minutes   Sitting with Back Unsupported but Feet Supported on Floor or Stool Able to sit safely and securely 2 minutes   Stand to Sit Sits safely with minimal use of hands   Transfers Able to transfer safely, minor use of hands   Standing Unsupported with Eyes Closed Able to stand 10 seconds safely   Standing Ubsupported with Feet Together Able to place feet together independently and stand 1 minute safely   From Standing, Reach Forward with Outstretched Arm Can reach confidently >25 cm (10")   From Standing Position, Pick up Object from Floor Able to pick up shoe, needs supervision   From Standing Position, Turn to Look Behind Over  each Shoulder Looks behind from both sides and weight shifts well   Turn 360 Degrees Able to turn 360 degrees safely in 4 seconds or less   Standing Unsupported, Alternately Place Feet on Step/Stool Able to stand independently and safely and complete 8 steps in 20 seconds   Standing Unsupported, One Foot in Front Able to plae foot ahead of the other independently and hold 30 seconds   Standing on One Leg Tries to lift leg/unable to hold 3 seconds but remains standing independently   Total Score 50                PT Education -  04/24/15 1048    Education provided Yes   Education Details HEP to continue upon d/c   Person(s) Educated Patient   Methods Explanation;Handout   Comprehension Verbalized understanding          PT Short Term Goals - 02/18/15 1722    PT SHORT TERM GOAL #1   Title Pt will be independent with HEP for improved strength, balance, and gait.   Status Achieved   PT SHORT TERM GOAL #2   Title Pt will improve  TUG score to less than or equal to 15 seconds for decreased fall risk.   Baseline 14.28 sec with tripod cane; 12.66 sec with no device   Status Achieved   PT SHORT TERM GOAL #3   Title Pt will improve Berg Balance score to at least 31/56 for decreased fall risk.   Baseline 36/56 on 02/18/15   Status Achieved   PT SHORT TERM GOAL #4   Title Pt will perform at least 8 of 10 reps of sit<>stand transfers with minimal UE support, to demonstrate improved lower extremity strength.   Status Achieved           PT Long Term Goals - 04/24/15 1048    PT LONG TERM GOAL #1   Title Pt will demonstrate independence in progression of HEP to address strength and balance. (Target 04/21/15)   Time 4   Period Weeks   Status Achieved   PT LONG TERM GOAL #2   Title Pt will improve Berg Balance score to at least 49/56 for decreased fall risk.   Baseline Merrilee Jansky 44/56   Time 4   Period Weeks   Status Achieved   PT LONG TERM GOAL #3   Title Pt will improve Dynamic Gait Index score to at least 19/24 for decreased fall risk.   Baseline DGI 17/24   Time 4   Period Weeks   Status New   PT LONG TERM GOAL #4   Title Pt will ambulate at least 1000 ft indoor and outdoor surfaces no device, for improved funcitonal mobility.   Status Achieved   PT LONG TERM GOAL #5   Title Pt will verbalize understanding of transition to community fitness upon D/C from PT.   Status Achieved               Plan - 04/24/15 1049    Clinical Impression Statement Pt met LTG # 1,2,4 and 5.  Need to check DGI on next  visit.  Continue PT per POC and discharge on next visit per Mady Haagensen, PT.   Pt will benefit from skilled therapeutic intervention in order to improve on the following deficits Abnormal gait;Decreased balance;Decreased coordination;Decreased mobility;Decreased strength;Difficulty walking   Rehab Potential Good   PT Frequency 2x / week   PT Duration 4 weeks   PT Treatment/Interventions ADLs/Self Care Home  Management;Functional mobility training;Stair training;Gait training;DME Instruction;Therapeutic activities;Therapeutic exercise;Balance training;Neuromuscular re-education;Patient/family education   PT Next Visit Plan Check DGI, FOTO?, discharge   Consulted and Agree with Plan of Care Patient        Problem List Patient Active Problem List   Diagnosis Date Noted  . Anxiety state 12/10/2014  . Neurogenic bladder 11/30/2014  . Neurogenic bowel 11/30/2014  . Bacterial UTI 11/30/2014  . Thoracic myelopathy 11/29/2014  . Persistent atrial fibrillation 11/27/2014  . Traumatic spinal subdural hematoma   . Acute pulmonary embolism   . Orthostatic hypotension 11/22/2014  . Near syncope 11/21/2014  . Paraplegia at T9 level 11/17/2014  . Weakness of both legs   . Nocturnal leg cramps 07/16/2014  . Encounter for therapeutic drug monitoring 12/25/2013  . Malaise and fatigue 06/01/2011  . Atrial flutter 03/10/2011  . Long term (current) use of anticoagulants 03/10/2011  . Benign hypertensive heart disease without heart failure 03/10/2011  . Hypercholesterolemia 03/10/2011  . Paroxysmal atrial flutter   . Palpitations   . PAC (premature atrial contraction)   . PVC's (premature ventricular contractions)   . Malaise   . Fatigue   . Myalgia     Narda Bonds 04/24/2015, 10:52 AM  [facility] [address] [place], [place], [address] Phone: [phone]   Fax:  [place], [place] 04/24/2015 10:53 AM Phone: [phone] Fax: [phone]

## 2015-04-25 ENCOUNTER — Ambulatory Visit: Payer: Medicare Other | Admitting: Physical Therapy

## 2015-04-25 DIAGNOSIS — R6889 Other general symptoms and signs: Secondary | ICD-10-CM

## 2015-04-25 DIAGNOSIS — R269 Unspecified abnormalities of gait and mobility: Secondary | ICD-10-CM

## 2015-04-25 NOTE — Therapy (Signed)
[facility] [address] [place], [place], [address] Phone: [phone]   Fax:  [phone]  Physical Therapy Treatment  Patient Details  Name: [name] Weber MRN: [id number] Date of Birth: 1931-05-17 Referring Provider:  Josetta Huddle, MD  Encounter Date: 04/25/2015      PT End of Session - 04/25/15 1624    Visit Number 33   Number of Visits 33   Date for PT Re-Evaluation 05/21/15   Authorization Type Medicare G-code every 10th visit   PT Start Time 1315   PT Stop Time 1355   PT Time Calculation (min) 40 min   Activity Tolerance Patient tolerated treatment well   Behavior During Therapy [facility] for tasks assessed/performed      Past Medical History  Diagnosis Date  . Paroxysmal atrial flutter   . Palpitations     OCCASSIONAL  . PAC (premature atrial contraction)     ISOLATED  . PVC's (premature ventricular contractions)     ISOLATED  . Malaise   . Fatigue   . Myalgia     Past Surgical History  Procedure Laterality Date  . Vulvar lesion removal  01/01/2009    She had a 1-cm area of erythema to the right of the urethral meatus  . Thoracic laminectomy for epidural abscess N/A 11/16/2014    Procedure: THORACIC LAMINECTOMY FOR EPIDURAL ABSCESS;  Surgeon: Floyce Stakes, MD;  Location: [facility];  Service: Neurosurgery;  Laterality: N/A;    There were no vitals filed for this visit.  Visit Diagnosis:  Abnormality of gait  Postural instability of trunk      Subjective Assessment - 04/25/15 1324    Subjective Pt walked for 40 minutes yesterday and went and joined the [employer].  Is planning on going 3x/week.   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   Currently in Pain? No/denies                         [facility] Adult PT Treatment/Exercise - 04/25/15 1325    Ambulation/Gait   Ambulation/Gait Yes   Ambulation/Gait Assistance 6: Modified independent (Device/Increase time)   Ambulation Distance (Feet) 1000 Feet  plus   Assistive device None   Gait Pattern Step-through pattern;Decreased dorsiflexion - right;Decreased dorsiflexion - left;Wide base of support   Ambulation Surface Level;Unlevel;Indoor;Outdoor;Paved;Gravel;Grass   Dynamic Gait Index   Level Surface Mild Impairment   Change in Gait Speed Normal   Gait with Horizontal Head Turns Normal   Gait with Vertical Head Turns Normal   Gait and Pivot Turn Mild Impairment   Step Over Obstacle Mild Impairment   Step Around Obstacles Normal   Steps Mild Impairment   Total Score 20   High Level Balance   High Level Balance Activities Other (comment)   High Level Balance Comments taps to 4" then 8" step with intermittent UE support;standing on foam and taps to 8" step with intermittent UE support                PT Education - 04/25/15 1623    Education provided Yes   Education Details Optimal community fitness   Person(s) Educated Patient   Methods Explanation   Comprehension Verbalized understanding          PT Short Term Goals - 02/18/15 1722    PT SHORT TERM GOAL #1  Title Pt will be independent with HEP for improved strength, balance, and gait.   Status Achieved   PT SHORT TERM GOAL #2   Title Pt will improve  TUG score to less than or equal to 15 seconds for decreased fall risk.   Baseline 14.28 sec with tripod cane; 12.66 sec with no device   Status Achieved   PT SHORT TERM GOAL #3   Title Pt will improve Berg Balance score to at least 31/56 for decreased fall risk.   Baseline 36/56 on 02/18/15   Status Achieved   PT SHORT TERM GOAL #4   Title Pt will perform at least 8 of 10 reps of sit<>stand transfers with minimal UE support, to demonstrate improved lower extremity strength.   Status Achieved           PT Long Term Goals - 05-16-15 1624    PT LONG TERM  GOAL #1   Title Pt will demonstrate independence in progression of HEP to address strength and balance. (Target 04/21/15)   Time 4   Period Weeks   Status New   PT LONG TERM GOAL #2   Title Pt will improve Berg Balance score to at least 49/56 for decreased fall risk.   Baseline Merrilee Jansky 44/56   Time 4   Period Weeks   Status Achieved   PT LONG TERM GOAL #3   Title Pt will improve Dynamic Gait Index score to at least 19/24 for decreased fall risk.   Baseline DGI 17/24   Time 4   Period Weeks   Status Achieved   PT LONG TERM GOAL #4   Title Pt will ambulate at least 1000 ft indoor and outdoor surfaces no device, for improved funcitonal mobility.   Status Achieved   PT LONG TERM GOAL #5   Title Pt will verbalize understanding of transition to community fitness upon D/C from PT.   Status Achieved               Plan - 05-16-15 1627    Clinical Impression Statement Pt met LTG #3.  States no further concerns or issues for PT.  Discharge per Mady Haagensen, PT.   PT Next Visit Plan Discharge   Consulted and Agree with Plan of Care Patient          G-Codes - 05-16-2015 1626    Functional Assessment Tool Used DGI 20/24      Problem List Patient Active Problem List   Diagnosis Date Noted  . Anxiety state 12/10/2014  . Neurogenic bladder 11/30/2014  . Neurogenic bowel 11/30/2014  . Bacterial UTI 11/30/2014  . Thoracic myelopathy 11/29/2014  . Persistent atrial fibrillation 11/27/2014  . Traumatic spinal subdural hematoma   . Acute pulmonary embolism   . Orthostatic hypotension 11/22/2014  . Near syncope 11/21/2014  . Paraplegia at T9 level 11/17/2014  . Weakness of both legs   . Nocturnal leg cramps 07/16/2014  . Encounter for therapeutic drug monitoring 12/25/2013  . Malaise and fatigue 06/01/2011  . Atrial flutter 03/10/2011  . Long term (current) use of anticoagulants 03/10/2011  . Benign hypertensive heart disease without heart failure 03/10/2011  .  Hypercholesterolemia 03/10/2011  . Paroxysmal atrial flutter   . Palpitations   . PAC (premature atrial contraction)   . PVC's (premature ventricular contractions)   . Malaise   . Fatigue   . Myalgia     Narda Bonds May 16, 2015, 4:28 PM  [facility] [address]  [place], [place], [address] Phone: [phone]   Fax:  [place], [place] 04/25/2015 4:28 PM Phone: [phone] Fax: [phone]

## 2015-07-30 ENCOUNTER — Encounter: Payer: Self-pay | Admitting: Physical Therapy

## 2015-07-30 NOTE — Therapy (Signed)
[facility] [address] [place], [place], [address] Phone: [phone]   Fax:  [phone]  Patient Details  Name: [name] Weber MRN: [id number] Date of Birth: 1931-01-16 Referring Provider:  No ref. provider found  Encounter Date: 07/30/2015  PHYSICAL THERAPY DISCHARGE SUMMARY  Visits from Start of Care: 33  Current functional level related to goals / functional outcomes:     PT Long Term Goals - 04/25/15 1624    PT LONG TERM GOAL #1   Title Pt will demonstrate independence in progression of HEP to address strength and balance. (Target 04/21/15)   Time 4   Period Weeks   Status New   PT LONG TERM GOAL #2   Title Pt will improve Berg Balance score to at least 49/56 for decreased fall risk.   Baseline Merrilee Jansky 44/56   Time 4   Period Weeks   Status Achieved   PT LONG TERM GOAL #3   Title Pt will improve Dynamic Gait Index score to at least 19/24 for decreased fall risk.   Baseline DGI 17/24   Time 4   Period Weeks   Status Achieved   PT LONG TERM GOAL #4   Title Pt will ambulate at least 1000 ft indoor and outdoor surfaces no device, for improved funcitonal mobility.   Status Achieved   PT LONG TERM GOAL #5   Title Pt will verbalize understanding of transition to community fitness upon D/C from PT.   Status Achieved     Pt has met all goals and has progressed to ambulation without assistive device.   Remaining deficits: Strength, balance, gait-continuing to improve   Education / Equipment: Pt has been educated in HEP, fall prevention, progression of exercises and gait.  Plan: Patient agrees to discharge.  Patient goals were met. Patient is being discharged due to meeting the stated rehab goals.  ?????  Pt is pleased with her current functional level and progress.      MARRIOTT,AMY W. 07/30/2015, 10:06 AM [facility] [address] [place], [place], [address] Phone: [phone]   Fax:  [phone]

## 2015-08-12 ENCOUNTER — Telehealth: Payer: Self-pay | Admitting: Internal Medicine

## 2015-08-12 NOTE — Telephone Encounter (Signed)
New message      Pt had a spinal bleed last dec due to warfarin.  She had surgery.  Pt had a filter put in her neck to prevent blood clots.  Pt is not on any blood thinners.  Should pt keep filter in her neck?  Pt will see Dr Tenny Craw in November but do not want to wait until then to ask.  They have been reading and gathering info about the filter and want to know what Dr Tenny Craw thinks.

## 2015-09-11 ENCOUNTER — Encounter: Payer: Self-pay | Admitting: Physical Medicine & Rehabilitation

## 2015-09-11 ENCOUNTER — Encounter: Payer: Medicare Other | Attending: Physical Medicine & Rehabilitation | Admitting: Physical Medicine & Rehabilitation

## 2015-09-11 VITALS — BP 155/82 | HR 72

## 2015-09-11 DIAGNOSIS — I4892 Unspecified atrial flutter: Secondary | ICD-10-CM | POA: Insufficient documentation

## 2015-09-11 DIAGNOSIS — K592 Neurogenic bowel, not elsewhere classified: Secondary | ICD-10-CM | POA: Insufficient documentation

## 2015-09-11 DIAGNOSIS — Z87891 Personal history of nicotine dependence: Secondary | ICD-10-CM | POA: Diagnosis not present

## 2015-09-11 DIAGNOSIS — I493 Ventricular premature depolarization: Secondary | ICD-10-CM | POA: Insufficient documentation

## 2015-09-11 DIAGNOSIS — R5381 Other malaise: Secondary | ICD-10-CM | POA: Insufficient documentation

## 2015-09-11 DIAGNOSIS — R5383 Other fatigue: Secondary | ICD-10-CM | POA: Insufficient documentation

## 2015-09-11 DIAGNOSIS — G839 Paralytic syndrome, unspecified: Secondary | ICD-10-CM | POA: Diagnosis not present

## 2015-09-11 DIAGNOSIS — M791 Myalgia: Secondary | ICD-10-CM | POA: Insufficient documentation

## 2015-09-11 DIAGNOSIS — I491 Atrial premature depolarization: Secondary | ICD-10-CM | POA: Diagnosis not present

## 2015-09-11 DIAGNOSIS — R29898 Other symptoms and signs involving the musculoskeletal system: Secondary | ICD-10-CM | POA: Diagnosis not present

## 2015-09-11 DIAGNOSIS — M4714 Other spondylosis with myelopathy, thoracic region: Secondary | ICD-10-CM | POA: Insufficient documentation

## 2015-09-11 DIAGNOSIS — N319 Neuromuscular dysfunction of bladder, unspecified: Secondary | ICD-10-CM | POA: Diagnosis not present

## 2015-09-11 DIAGNOSIS — R002 Palpitations: Secondary | ICD-10-CM | POA: Diagnosis not present

## 2015-09-11 DIAGNOSIS — G8220 Paraplegia, unspecified: Secondary | ICD-10-CM | POA: Diagnosis not present

## 2015-09-11 NOTE — Patient Instructions (Signed)
PLEASE CALL ME WITH ANY PROBLEMS OR QUESTIONS (#[phone]).  HAVE A GOOD DAY!    

## 2015-09-11 NOTE — Progress Notes (Signed)
Subjective:    Patient ID: [name] Weber, female    DOB: 01/09/1931, 81 y.o.   MRN: [id number]  HPI   [name] Weber his here in follow up of her thoracic myelopathy. She has been progressing with her mobility. She is walking with a cane outside the home, without a cane inside. She occasionally walks her dog..  She is having some persistent weakness in her left foot (drop) with associated burning pain in the left shin. She hasn't had any falls. She wonders what kind of recovery to expect and what she can do to further strengthen the leg.     Pain Inventory Average Pain 0 Pain Right Now 0  In the last 24 hours, has pain interfered with the following? General activity 0 Relation with others 0 Enjoyment of life 0 What TIME of day is your pain at its worst? No pain Sleep (in general) NA  Pain is worse with: No Pain Pain improves with: No Pain Relief from Meds: Currently not on any medication  Mobility use a cane how many minutes can you walk? 60 ability to climb steps?  yes do you drive?  yes  Function retired Do you have any goals in this area?  no  Neuro/Psych weakness trouble walking  Prior Studies Any changes since last visit?  no  Physicians involved in your care Primary care Dr. Kevan NyGates Neurosurgeon Dr. Yetta BarreBotera   Family History  Problem Relation Age of Onset  . Hyperlipidemia Mother    Social History   Social History  . Marital Status: Divorced    Spouse Name: N/A  . Number of Children: N/A  . Years of Education: N/A   Social History Main Topics  . Smoking status: Former Smoker    Quit date: 03/09/1991  . Smokeless tobacco: None  . Alcohol Use: No  . Drug Use: No  . Sexual Activity: Not Asked   Other Topics Concern  . None   Social History Narrative   Past Surgical History  Procedure Laterality Date  . Vulvar lesion removal  01/01/2009    She had a 1-cm area of erythema to the right of the urethral meatus  . Thoracic laminectomy for  epidural abscess N/A 11/16/2014    Procedure: THORACIC LAMINECTOMY FOR EPIDURAL ABSCESS;  Surgeon: Karn CassisErnesto M Botero, MD;  Location: [facility];  Service: Neurosurgery;  Laterality: N/A;   Past Medical History  Diagnosis Date  . Paroxysmal atrial flutter (HCC)   . Palpitations     OCCASSIONAL  . PAC (premature atrial contraction)     ISOLATED  . PVC's (premature ventricular contractions)     ISOLATED  . Malaise   . Fatigue   . Myalgia    BP 155/82 mmHg  Pulse 72  SpO2 98%  Opioid Risk Score:   Fall Risk Score:  `1  Depression screen PHQ 2/9  No flowsheet data found.   Review of Systems  Neurological: Positive for weakness.       Gait difficulty  All other systems reviewed and are negative.      Objective:   Physical Exam  Constitutional: She is oriented to person, place, and time. She appears well-developed.  HENT: oral mucosa moist, dentition good. HOH  Head: Normocephalic and atraumatic.  Eyes: Conjunctivae are normal. Pupils are equal, round, and reactive to light.  Neck: Normal range of motion. Neck supple.  Cardiovascular: Normal rate.  Respiratory: Effort normal and breath sounds normal. No respiratory distress. She has no wheezes.  GI: Soft. Bowel sounds are normal. She exhibits no distension.  Abdomen Non-tender.  Musculoskeletal: She exhibits no edema or tenderness. l Neurological: She is alert and oriented to person, place, and time.  Impaired proprioception in both legs/LT improved in both legs but this has improved. Strength: 4 4+/5HF,KE bilaterally. RADF 3+, RAPF 4+, LADF trace, LAPF 4. Eversion also weak on either side moreso on the left. Uses a substantial steppage gait on the left side to help clear the foot.   Skin: Skin is warm and dry. Back incision clean,dry and intact.  Psych: pleasant.    Assessment/Plan:  1. Functional deficits secondary to T 10-12 hematoma with myelopathy and paraplegia  -encouraged ongoing HEP -made a referral to  [facility] for a left foot up AFO. She will see them today. I think the foot up orthotic should be sufficient given her strength in other areas of the leg 2. Neurogenic bowel:  -diet regulated  3. Neurogenic bladder:  -back to baseline  4. Afib: mgt per Dr. Tenny Craw  5. Follow up with me in 4 mongth. .Fifteen minutes of face to face patient care time were spent during this visit. All questions were encouraged and answered.   Ranelle Oyster, MD, [facility]  [facility] Physical Medicine & Rehabilitation

## 2015-10-14 ENCOUNTER — Encounter: Payer: Self-pay | Admitting: Internal Medicine

## 2015-10-14 ENCOUNTER — Ambulatory Visit (INDEPENDENT_AMBULATORY_CARE_PROVIDER_SITE_OTHER): Payer: Medicare Other | Admitting: Internal Medicine

## 2015-10-14 VITALS — BP 134/84 | HR 63 | Ht 66.5 in | Wt 105.0 lb

## 2015-10-14 DIAGNOSIS — I4892 Unspecified atrial flutter: Secondary | ICD-10-CM | POA: Diagnosis not present

## 2015-10-14 NOTE — Progress Notes (Signed)
Cardiology Office Note   Date:  10/14/2015   ID:  [name] Policevelyn C Nienhuis, DOB 08/23/1931, MRN [id number]  PCP:  Pearla DubonnetGATES,[name], MD  Cardiologist:   Dietrich PatesPaula Ross, MD   Chief Complaint  Patient presents with  . Atrial Fibrillation      History of Present Illness: [name] Weber is a 81 y.o. female with a history of .PAF, hyperlipidemia She had an spinal bleed in December 2015  She was on coumadin at the time  INR was 3.1 at the time  She has not been on it since  She was seen by cardiology   In/Out of atrial flutter on admission  Also orthostatic  She is off of florinef Denies CP  No SOB  No dizziness    Current Outpatient Prescriptions  Medication Sig Dispense Refill  . alendronate (FOSAMAX) 35 MG tablet Take 35 mg by mouth every 7 (seven) days. Take with a full glass of water on an empty stomach. Sunday    . aspirin EC 81 MG EC tablet Take 1 tablet (81 mg total) by mouth daily.    Marland Kitchen. atorvastatin (LIPITOR) 10 MG tablet Take 10 mg by mouth daily.    . digoxin (LANOXIN) 0.125 MG tablet Take 1 tablet (0.125 mg total) by mouth daily. 30 tablet 1   No current facility-administered medications for this visit.    Allergies:   Crestor   Past Medical History  Diagnosis Date  . Paroxysmal atrial flutter (HCC)   . Palpitations     OCCASSIONAL  . PAC (premature atrial contraction)     ISOLATED  . PVC's (premature ventricular contractions)     ISOLATED  . Malaise   . Fatigue   . Myalgia     Past Surgical History  Procedure Laterality Date  . Vulvar lesion removal  01/01/2009    She had a 1-cm area of erythema to the right of the urethral meatus  . Thoracic laminectomy for epidural abscess N/A 11/16/2014    Procedure: THORACIC LAMINECTOMY FOR EPIDURAL ABSCESS;  Surgeon: Karn CassisErnesto M Botero, MD;  Location: [facility];  Service: Neurosurgery;  Laterality: N/A;     Social History:  The patient  reports that she quit smoking about 24 years ago. She does not have any  smokeless tobacco history on file. She reports that she does not drink alcohol or use illicit drugs.   Family History:  The patient's family history includes Hyperlipidemia in her mother.    ROS:  Please see the history of present illness. All other systems are reviewed and  Negative to the above problem except as noted.    PHYSICAL EXAM: VS:  BP 134/84 mmHg  Pulse 63  Ht 5' 6.5" (1.689 m)  Wt 47.628 kg (105 lb)  BMI 16.70 kg/m2  GEN: Well nourished, well developed, in no acute distress HEENT: normal Neck: no JVD, carotid bruits, or masses Cardiac: Irreg; no murmurs, rubs, or gallops,no edema  Respiratory:  clear to auscultation bilaterally, normal work of breathing GI: soft, nontender, nondistended, + BS  No hepatomegaly  MS: no deformity Moving all extremities   Skin: warm and dry, no rash Neuro:  Strength and sensation are intact Psych: euthymic mood, full affect   EKG:  EKG is ordered today.  Atrial fib  62 bpm  RSR' in V1       Wt Readings from Last 3 Encounters:  10/14/15 47.628 kg (105 lb)  03/25/15 47.991 kg (105 lb 12.8 oz)  02/18/15 53.071 kg (  117 lb)      ASSESSMENT AND PLAN:  1.  Atrial fib  Currently rate controlled  She has not been on anticoagulatoin since last winter after bleed  INR was 3.1 at the time I have discussed with EP  She may be a candidate for the Watchman device for LA appendage obliteration.  This would, however, require that she is on anticoag for 6 weeks after the procedure. Concern given history but she remains off and is at risk for a CVA  Will forward to Dr [name].  2.  HL  On satin.  Will need to review recent lipids        Signed, Dietrich Pates, MD  10/14/2015 8:24 AM    [facility] [address] [place], [place], [place]  [address] Phone: [phone]; Fax: [phone]

## 2015-10-14 NOTE — Patient Instructions (Signed)
Your physician recommends that you continue on your current medications as directed. Please refer to the Current Medication list given to you today. Your physician wants you to follow-up in: 8-9 months with Dr. Tenny Crawoss.  You will receive a reminder letter in the mail two months in advance. If you don't receive a letter, please call our office to schedule the follow-up appointment.

## 2015-10-21 ENCOUNTER — Telehealth: Payer: Self-pay | Admitting: Internal Medicine

## 2015-10-21 NOTE — Telephone Encounter (Signed)
Please add her on to my schedule 11/28 to discuss.  I will see her in conjunction with Dr Johney FrameAllred that day.  Thank you,  Gypsy BalsamAmber Norma Ignasiak, NP 10/21/2015 7:18 PM

## 2015-10-21 NOTE — Telephone Encounter (Signed)
Spoke with Vi Appleton; patient's friend/family member.   The patient had an appointment with Dr. Jeral FruitBotero today who according to Ms. Sedonia Smallppleton states he would be ok with patient having Watchman. She would like to have a follow up appointment to discuss further with cardiology as to what the next steps are. Advised I will inform Dr. Tenny Crawoss and we will contact them.

## 2015-10-21 NOTE — Telephone Encounter (Signed)
New Message  Pt calling to speak w/ Rn about possible procedure- discussed @ OV on 11/14- Please call back and discuss.

## 2015-10-22 NOTE — Telephone Encounter (Signed)
Pt has appointment 10/28/15.

## 2015-10-22 NOTE — Telephone Encounter (Signed)
Informed [name] someone will be calling her to schedule the patient for 10/28/15.   Message sent to EP scheduler.

## 2015-10-23 ENCOUNTER — Other Ambulatory Visit: Payer: Self-pay | Admitting: Internal Medicine

## 2015-10-23 DIAGNOSIS — Z1231 Encounter for screening mammogram for malignant neoplasm of breast: Secondary | ICD-10-CM

## 2015-10-27 NOTE — Progress Notes (Signed)
Electrophysiology Consult Note Date: 10/28/2015  ID:  [name] Weber, DOB 02/25/1931, MRN [id number]  PCP: Pearla DubonnetGATES,[name], MD Primary Cardiologist: Tenny Crawoss  CC: evaluate for Watchman  [name] Weber is a 81 y.o. female seen in consultation today for Dr Tenny Crawoss for consideration of Watchman implant. She has a history of persistent atrial fibrillation and was previously anticoagulated with Warfarin. She presented in 10/2014 with a lower thoracic hematoma and underwent evacuation with very good neurologic recovery (INR 3.1).  She also developed a DVT that admission and underwent IVC filter placement.  She has been off anticoagulation since that time. She has seen Dr Jeral FruitBotero recently who felt that she would be able to take short term anticoagulation.  She denies chest pain, palpitations, dyspnea, PND, orthopnea, nausea, vomiting, dizziness, syncope, edema, weight gain, or early satiety.  She is making progress from her thoracic hematoma and [facility] walking [facility]   Past Medical History  Diagnosis Date  . Paroxysmal atrial flutter (HCC)   . Palpitations     OCCASSIONAL  . PAC (premature atrial contraction)     ISOLATED  . PVC's (premature ventricular contractions)     ISOLATED  . Malaise   . Fatigue   . Myalgia    Past Surgical History  Procedure Laterality Date  . Vulvar lesion removal  01/01/2009    She had a 1-cm area of erythema to the right of the urethral meatus  . Thoracic laminectomy for epidural abscess N/A 11/16/2014    Procedure: THORACIC LAMINECTOMY FOR EPIDURAL ABSCESS;  Surgeon: Karn CassisErnesto M Botero, MD;  Location: [facility];  Service: Neurosurgery;  Laterality: N/A;    Current Outpatient Prescriptions  Medication Sig Dispense Refill  . alendronate (FOSAMAX) 35 MG tablet Take 35 mg by mouth every 7 (seven) days. Take with a full glass of water on an empty stomach. Sunday    . aspirin EC 81 MG EC tablet Take 1 tablet (81 mg total) by mouth  daily.    Marland Kitchen. atorvastatin (LIPITOR) 10 MG tablet Take 10 mg by mouth daily.    . digoxin (LANOXIN) 0.125 MG tablet Take 1 tablet (0.125 mg total) by mouth daily. 30 tablet 1  . Omega-3 Fatty Acids (FISH OIL TRIPLE STRENGTH PO) Take 1 capsule by mouth 2 (two) times daily.     No current facility-administered medications for this visit.    Allergies:   Crestor   Social History: Social History   Social History  . Marital Status: Divorced    Spouse Name: N/A  . Number of Children: N/A  . Years of Education: N/A   Occupational History  . Not on file.   Social History Main Topics  . Smoking status: Former Smoker    Quit date: 03/09/1991  . Smokeless tobacco: Not on file  . Alcohol Use: No  . Drug Use: No  . Sexual Activity: Not on file   Other Topics Concern  . Not on file   Social History Narrative    Family History: Family History  Problem Relation Age of Onset  . Hyperlipidemia Mother     Review of Systems: All other systems reviewed and are otherwise negative except as noted above.   Physical Exam: VS:  BP 150/80 mmHg  Pulse 101  Ht 5\' 5"  (1.651 m)  Wt 106 lb (48.081 kg)  BMI 17.64 kg/m2 , BMI Body mass index is 17.64 kg/(m^2). Wt Readings from Last 3 Encounters:  10/28/15  106 lb (48.081 kg)  10/14/15 105 lb (47.628 kg)  03/25/15 105 lb 12.8 oz (47.991 kg)    GEN- The patient is elderly and thin appearing, alert and oriented x 3 today.   HEENT: normocephalic, atraumatic; sclera clear, conjunctiva pink; hearing intact; oropharynx clear; neck supple  Lungs- Clear to ausculation bilaterally, normal work of breathing.  No wheezes, rales, rhonchi Heart- Irregular rate and rhythm  GI- soft, non-tender, non-distended, bowel sounds present  Extremities- no clubbing, cyanosis, or edema; DP/PT/radial pulses 2+ bilaterally MS- no significant deformity or atrophy Skin- warm and dry, no rash or lesion  Psych- euthymic mood, full affect Neuro- strength and sensation  are intact   EKG:  EKG is not ordered today. The ekg 10/2015 demonstrated atrial fibrillation   Recent Labs: 12/05/2014: ALT 30 02/18/2015: BUN 15; Creatinine, Ser 0.60; Hemoglobin 12.5; Platelets 171.0; Potassium 4.1; Pro B Natriuretic peptide (BNP) 275.0*; Sodium 142    Other studies Reviewed: Additional studies/ records that were reviewed today include: Dr Tenny Craw' office notes  Assessment and Plan: 1.  Persistent atrial fibrillation The patient is relatively asymptomatic as it relates to her atrial arrhythmias. I have seen [name] Weber is a 81 y.o. female in the office today who has been referred by Dr Tenny Craw for a Watchman left atrial appendage closure device.  She has a history of paroxysmal atrial fibrillation.  This patients CHA2DS2-VASc Score and unadjusted Ischemic Stroke Rate (% per year) is equal to 3.2 % stroke rate/year from a score of 3 which necessitates long term oral anticoagulation to prevent stroke. HasBled score is 2.  Modified Rankin Score is 1. Unfortunately, She is not felt to be a long term Warfarin candidate secondary to thoracic hematoma.  The patients chart has been reviewed and I along with their referring cardiologist and neurosurgeon feel that they would be a candidate for short term oral anticoagulation.  Procedural risks for the Watchman implant have been reviewed with the patient including a 1% risk of stroke, 2% risk of perforation, 0.1% risk of device embolization.  Given the patient's poor candidacy for long-term oral anticoagulation, ability to tolerate short term oral anticoagulation, I have recommended the watchman left atrial appendage closure system.  Will verify with Dr [name] that he feels that patient could do 6 weeks of Warfarin/ASA and then ASA/Plavix until 6 months post procedure.  I will then follow up with patient to see if she would like to pursue Watchman placement.    Current medicines are reviewed at length with the patient today.   The patient  does not have concerns regarding her medicines.  The following changes were made today:  none  Labs/ tests ordered today include: none   Disposition:   Follow up with EP prn pending Watchman decision    Signed, Hillis Range, MD  10/28/2015 2:06 PM   [facility] [address] [place] [address] [phone] (office) [phone] (fax)

## 2015-10-28 ENCOUNTER — Ambulatory Visit (INDEPENDENT_AMBULATORY_CARE_PROVIDER_SITE_OTHER): Payer: Medicare Other | Admitting: Internal Medicine

## 2015-10-28 ENCOUNTER — Encounter: Payer: Self-pay | Admitting: Nurse Practitioner

## 2015-10-28 VITALS — BP 150/80 | HR 101 | Ht 65.0 in | Wt 106.0 lb

## 2015-10-28 DIAGNOSIS — S065X9S Traumatic subdural hemorrhage with loss of consciousness of unspecified duration, sequela: Secondary | ICD-10-CM

## 2015-10-28 DIAGNOSIS — I481 Persistent atrial fibrillation: Secondary | ICD-10-CM

## 2015-10-28 DIAGNOSIS — I480 Paroxysmal atrial fibrillation: Secondary | ICD-10-CM

## 2015-10-28 DIAGNOSIS — I4819 Other persistent atrial fibrillation: Secondary | ICD-10-CM

## 2015-10-28 NOTE — Patient Instructions (Signed)
Medication Instructions:   CONTINUE SAME MEDICATIONS    If you need a refill on your cardiac medications before your next appointment, please call your pharmacy.  Labwork: NONE ORDER TODAY    Testing/Procedures:  NONE ORDER TODAY    Follow-Up:  AS NEEDED WITH ANY CARDIAC RELATED SYMTOMS  Any Other Special Instructions Will Be Listed Below (If Applicable).

## 2015-10-30 ENCOUNTER — Telehealth: Payer: Self-pay | Admitting: Nurse Practitioner

## 2015-10-30 NOTE — Telephone Encounter (Signed)
I have left a message with Dr Cassandria SanteeBotero's office to see if patient would be candidate for device needing to be on 6 weeks of Warfarin/ASA and then from 6 weeks to 6 months post op on Plavix/ASA.    Insurance has been contacted regarding Watchman - no prior authorization required.   Left message for [name] Weber to call back to give update per patient's request at recent appointment.  [name] BalsamAmber Keyari Kleeman, NP 10/30/2015 12:03 PM

## 2015-10-30 NOTE — Telephone Encounter (Signed)
F/u   Vi pt's neighbor is returning your call.

## 2015-10-30 NOTE — Telephone Encounter (Signed)
Spoke with Vi Appleton per patient's request and updated her. Will await Dr Cassandria SanteeBotero's comments on ability to take anticoagulation/anti-platelets post LAA occluder.   Gypsy BalsamAmber Eero Dini, NP 10/30/2015 12:42 PM

## 2015-11-01 ENCOUNTER — Telehealth: Payer: Self-pay | Admitting: Internal Medicine

## 2015-11-01 NOTE — Telephone Encounter (Signed)
New Message   She is [facility] the [facility]

## 2015-11-01 NOTE — Telephone Encounter (Signed)
Spoke with Vi [name] - she is aware of Dr Cassandria SanteeBotero's comments. Gave potential TEE dates from now through Jan 16th.  They prefer a Monday or Wednesday.   They will call back after discussing if they would like to proceed. If they call back, please forward call to Dennis BastKelly Lanier, RN to schedule TEE (needs to be with Reubin MilanNelson, Nishan, or Hilty).   Gypsy BalsamAmber Seiler, NP 11/01/2015 1:02 PM

## 2015-11-01 NOTE — Telephone Encounter (Signed)
Left message for Vi Appleton to call back. Discussed with Dr Jeral FruitBotero - he is ok with short term anticoagulation and Plavix/ASA.    Gypsy BalsamAmber Ryatt Corsino, NP 11/01/2015 8:37 AM

## 2015-11-11 ENCOUNTER — Telehealth: Payer: Self-pay | Admitting: Internal Medicine

## 2015-11-11 NOTE — Telephone Encounter (Signed)
New message      Dr Tenny Crawoss referred pt to Dr Johney FrameAllred about watchman procedure.  Talk to you about a second opinion.

## 2015-11-13 NOTE — Telephone Encounter (Signed)
[facility] [place] [place]  Destini Cambre [place], [place] 11/13/2015 10:14 AM

## 2015-11-13 NOTE — Telephone Encounter (Signed)
Notified that she can look on the [employer] web site at [email or web address] and see what physicians are doing Watchman procedures at [facility], [facility] or [place].  She does not need referral- they accept self referral.  States she will look into the web site.

## 2015-11-13 NOTE — Telephone Encounter (Signed)
Spoke to patient  She would like second opinion re procedure  Concerned about [facility] I told her this could be similar elsewhere Will forward to A [place] Pt said future contact should be to Vi [place] [phone]

## 2015-11-15 ENCOUNTER — Telehealth: Payer: Self-pay | Admitting: Internal Medicine

## 2015-11-15 NOTE — Telephone Encounter (Signed)
New Message  Please complete the referral to Dr[facility] Eppie [facility]Joseph Rossi at [facility] [place] concerning Watchman procedure. Dr. Oliver Humossi's number [phone]. Please know that even though she was advised that this adidnt require a referral. Dr. Oliver Humossi's office states that it is required.   Please call back to discuss

## 2015-11-18 ENCOUNTER — Telehealth: Payer: Self-pay | Admitting: *Deleted

## 2015-11-18 NOTE — Telephone Encounter (Signed)
Ms [name] Weber is calling about [name] and the last time she saw Dr [name] he mentioned an NCS for her lower extremeties.  Ms [name] Weber who brings her to appts is asking how to get that scheduled.  I do not see mention in note.  Do you want this to be scheduled?

## 2015-11-18 NOTE — Telephone Encounter (Signed)
I don't recall mentioning that to her, nor does my note indicate it either like you said. Her weakness and sensory loss in the legs is related to her SCI.

## 2015-11-18 NOTE — Telephone Encounter (Signed)
There could be an underlying neuropathy. If they really want a study we could set her up for one with me.

## 2015-11-18 NOTE — Telephone Encounter (Signed)
I gave the "friend" (on signed DPR) the response and she insists she was in the rooma and heard it too.They just wanted me to let you know [name] in concerned about it.  Her next appt is just a 4 mo follow up 01/08/16.

## 2015-11-18 NOTE — Telephone Encounter (Signed)
Left a detailed message on [name] Weber's voicemail (DPR signed) that she should probably ask PCP for referral for Dr. Oliver Humossi's office. While our Dr is fine with her getting second opinion, we are not technically referring her.   Advised to call back if further questions/concerns arise.

## 2015-11-18 NOTE — Telephone Encounter (Signed)
[name] Weber can you call and schedule a NCS with Dr [name], please thx

## 2015-11-19 NOTE — Telephone Encounter (Signed)
Patient was contacted and scheduled an appt for 12/23/2014 @ 11:00 with [name]

## 2015-11-19 NOTE — Telephone Encounter (Signed)
Ms  [name] Weber called to confirm test NCS is done here at the office and appt time.  I asked that she arrive by 11:00 for 11:20 appt.

## 2015-12-04 ENCOUNTER — Ambulatory Visit
Admission: RE | Admit: 2015-12-04 | Discharge: 2015-12-04 | Disposition: A | Discharge status: Home / Self Care | Payer: Medicare Other | Source: Ambulatory Visit | Attending: Internal Medicine | Admitting: Internal Medicine

## 2015-12-04 DIAGNOSIS — Z1231 Encounter for screening mammogram for malignant neoplasm of breast: Secondary | ICD-10-CM

## 2015-12-24 ENCOUNTER — Encounter: Payer: Medicare Other | Attending: Physical Medicine & Rehabilitation | Admitting: Physical Medicine & Rehabilitation

## 2015-12-24 ENCOUNTER — Encounter: Payer: Self-pay | Admitting: Physical Medicine & Rehabilitation

## 2015-12-24 VITALS — BP 154/89

## 2015-12-24 DIAGNOSIS — R5383 Other fatigue: Secondary | ICD-10-CM | POA: Diagnosis not present

## 2015-12-24 DIAGNOSIS — K592 Neurogenic bowel, not elsewhere classified: Secondary | ICD-10-CM | POA: Insufficient documentation

## 2015-12-24 DIAGNOSIS — N319 Neuromuscular dysfunction of bladder, unspecified: Secondary | ICD-10-CM | POA: Diagnosis not present

## 2015-12-24 DIAGNOSIS — M791 Myalgia: Secondary | ICD-10-CM | POA: Insufficient documentation

## 2015-12-24 DIAGNOSIS — R5381 Other malaise: Secondary | ICD-10-CM | POA: Insufficient documentation

## 2015-12-24 DIAGNOSIS — I4892 Unspecified atrial flutter: Secondary | ICD-10-CM | POA: Diagnosis not present

## 2015-12-24 DIAGNOSIS — R002 Palpitations: Secondary | ICD-10-CM | POA: Insufficient documentation

## 2015-12-24 DIAGNOSIS — R29898 Other symptoms and signs involving the musculoskeletal system: Secondary | ICD-10-CM | POA: Diagnosis not present

## 2015-12-24 DIAGNOSIS — G839 Paralytic syndrome, unspecified: Secondary | ICD-10-CM | POA: Diagnosis not present

## 2015-12-24 DIAGNOSIS — M4714 Other spondylosis with myelopathy, thoracic region: Secondary | ICD-10-CM | POA: Diagnosis not present

## 2015-12-24 DIAGNOSIS — I493 Ventricular premature depolarization: Secondary | ICD-10-CM | POA: Diagnosis not present

## 2015-12-24 DIAGNOSIS — G8220 Paraplegia, unspecified: Secondary | ICD-10-CM

## 2015-12-24 DIAGNOSIS — Z87891 Personal history of nicotine dependence: Secondary | ICD-10-CM | POA: Insufficient documentation

## 2015-12-24 DIAGNOSIS — I491 Atrial premature depolarization: Secondary | ICD-10-CM | POA: Insufficient documentation

## 2015-12-24 NOTE — Patient Instructions (Signed)
  PLEASE CALL ME WITH ANY PROBLEMS OR QUESTIONS (#[phone]).      

## 2015-12-24 NOTE — Progress Notes (Signed)
Lower Extremity Nerve Conduction Study Reason for Exam:  Bilateral foot drop/sensory loss, paraplegia ICD10 Code: G83.9   Exam: Bilateral lower extremity nerve conduction screen was performed. The bilateral posterior tibial motor, peroneal motor, and sural sensory nerves were examined. The patient was prepped and placed in position. Skin was deemed adequately warm for the exam. Using the extensor digitorum brevis for the active electrode site I stimulated the peroneal nerve orthodromically both at the ankle as well as at the fibular head. In the right lower extremity the peroneal nerve was normal for   nerve conduction velocity, latency, and amplitude although along the lower end of normal for CV (77m/s). In the left leg there was delay in peroneal distal latency and amplitude was small. Conduction velocity was 31-76m/s throughout the leg.   I used the abductor hallucis muscle as the active electrode site for stimulation of the tibial nerve. The tibial nerve was then stimulated proximally at the medial malleolus as well as in the popliteal fossa in both legs. tbial. The tibial nerve was normal for amplitude although latency was delayed and NCV was borderline 39-57m/s in the left and right legs respectively. Lastly I used the interspace between the heel and the lateral malleolus as a recording site for the sural sensory nerve. The nerve was then stimulated 14cm proximally at the calf. The sural nerve latency was prolonged on right with borderline normal ampltitude. I could not find the a left sural nerve response.     Conclusion: 1. T9 myelopathy with ongoing motor/sensory deficits 2. 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 I believe some results were skewed by muscle atrophy at recording sites for CMAP's. Her clinical findings are out of proportion for what we would expect to see given the NCS findings.  Recommended ongoing orthotics/exercises/appropriate diet. We can  revisit NCS in the Summer to see if there is any electrodiagnostic evidence of progression. She has no medical history which would suggest a source for a progressive polyneuropathy. ALL of her clinical symptoms began AFTER her thoracic cord injury.      Ranelle Oyster, MD, [facility] Physical Medicine & Rehabilitation

## 2016-01-08 ENCOUNTER — Encounter: Payer: Medicare Other | Admitting: Physical Medicine & Rehabilitation

## 2016-01-30 ENCOUNTER — Telehealth: Payer: Self-pay | Admitting: Internal Medicine

## 2016-01-30 NOTE — Telephone Encounter (Signed)
Returning your call. °

## 2016-01-30 NOTE — Telephone Encounter (Signed)
Operator called patient to offer an appointment tomorrow instead of 4/3. Patient prefers to keep the April appointment.  "I have a full schedule already tomorrow"

## 2016-03-02 ENCOUNTER — Encounter: Payer: Self-pay | Admitting: Internal Medicine

## 2016-03-02 ENCOUNTER — Ambulatory Visit (INDEPENDENT_AMBULATORY_CARE_PROVIDER_SITE_OTHER): Payer: Medicare Other | Admitting: Internal Medicine

## 2016-03-02 VITALS — BP 118/66 | HR 82 | Ht 65.0 in | Wt 104.2 lb

## 2016-03-02 DIAGNOSIS — I480 Paroxysmal atrial fibrillation: Secondary | ICD-10-CM

## 2016-03-02 NOTE — Progress Notes (Signed)
Cardiology Office Note   Date:  03/02/2016   ID:  [name] Weber, DOB 01/29/1931, MRN [id number]  PCP:  Pearla DubonnetGATES,[name], MD  Cardiologist:   Dietrich PatesPaula Ross, MD   F/U of atrial fib     History of Present Illness: [name] Weber is a 82 y.o. female with a history of  .PAF, hyperlipidemia She had an spinal bleed in December 2015  SInce I saw her she has been  seen by J Allred  Recomm Watch[facility]  (Dr Andrey Farmerossi)  Reviewed/disccussed watchman  She denies CP   Breathing is OK  No palpitations   Weak   Outpatient Prescriptions Prior to Visit  Medication Sig Dispense Refill  . alendronate (FOSAMAX) 35 MG tablet Take 35 mg by mouth every 7 (seven) days. Take with a full glass of water on an empty stomach. Sunday    . aspirin EC 81 MG EC tablet Take 1 tablet (81 mg total) by mouth daily.    . digoxin (LANOXIN) 0.125 MG tablet Take 1 tablet (0.125 mg total) by mouth daily. 30 tablet 1  . Omega-3 Fatty Acids (FISH OIL TRIPLE STRENGTH PO) Take 1 capsule by mouth 2 (two) times daily.    Marland Kitchen. atorvastatin (LIPITOR) 10 MG tablet Take 10 mg by mouth daily. Reported on 03/02/2016     No facility-administered medications prior to visit.     Allergies:   Crestor   Past Medical History  Diagnosis Date  . Paroxysmal atrial flutter (HCC)   . Palpitations     OCCASSIONAL  . PAC (premature atrial contraction)     ISOLATED  . PVC's (premature ventricular contractions)     ISOLATED  . Malaise   . Fatigue   . Myalgia     Past Surgical History  Procedure Laterality Date  . Vulvar lesion removal  01/01/2009    She had a 1-cm area of erythema to the right of the urethral meatus  . Thoracic laminectomy for epidural abscess N/A 11/16/2014    Procedure: THORACIC LAMINECTOMY FOR EPIDURAL ABSCESS;  Surgeon: Karn CassisErnesto M Botero, MD;  Location: [facility];  Service: Neurosurgery;  Laterality: N/A;     Social History:  The patient  reports that she quit smoking about 25 years ago. She  does not have any smokeless tobacco history on file. She reports that she does not drink alcohol or use illicit drugs.   Family History:  The patient's family history includes Hyperlipidemia in her mother.    ROS:  Please see the history of present illness. All other systems are reviewed and  Negative to the above problem except as noted.    PHYSICAL EXAM: VS:  BP 118/66 mmHg  Pulse 82  Ht 5\' 5"  (1.651 m)  Wt 104 lb 3.2 oz (47.265 kg)  BMI 17.34 kg/m2  SpO2 98%  GEN: Thin 82 yo, in no acute distress HEENT: normal Neck: no JVD, carotid bruits, or masses Cardiac: RRR; no murmurs, rubs, or gallops,no edema  Respiratory:  clear to auscultation bilaterally, normal work of breathing GI: soft, nontender, nondistended, + BS    No hepatomegaly  MS: no deformity Moving all extremities  Legs thin   Skin: warm and dry, no rash Neuro:  Legs appear weak   Psych: euthymic mood, full affect   EKG:  EKG is not  ordered today.   Lipid Panel    Component Value Date/Time   CHOL 230* 02/18/2012 0937   TRIG 50.0 02/18/2012 [id number]  HDL 87.90 02/18/2012 0937   CHOLHDL 3 02/18/2012 0937   VLDL 10.0 02/18/2012 0937   LDLDIRECT 126.0 02/18/2012 0937      Wt Readings from Last 3 Encounters:  03/02/16 104 lb 3.2 oz (47.265 kg)  10/28/15 106 lb (48.081 kg)  10/14/15 105 lb (47.628 kg)      ASSESSMENT AND PLAN:  1  PAF CHADSVASc 3  Pt does not sense spells  She has been to see J Allred and also Dr Andrey Farmer at [facility] (EP)  Both have discussed Watchman device  Case reviewed with Dr [name] as well who felt short term anticoaguation acceptable  Pt still fearful  Daughter asked about other options , investigations  None that I know of  I encouraged them to discuss with Dr [name] and Dr Andrey Farmer  2.  HL  Wi.ll get liipdis  Off of lipitor due to concerns for muscle weakness  Encouraged her to increase food intake, increase strength training to maintain muscle mass  FU in [employer], Dietrich Pates, MD  03/02/2016 9:59 AM    [facility] [address] [place], [place], [place]  [address] Phone: [phone]; Fax: [phone]

## 2016-03-02 NOTE — Patient Instructions (Addendum)
Your physician recommends that you continue on your current medications as directed. Please refer to the Current Medication list given to you today. Your physician wants you to follow-up in: November, 2017 with Dr. Ross.  You will receive a reminder letter in the mail two months in advance. If you don't receive a letter, please call our office to schedule the follow-up appointment.  

## 2016-03-03 ENCOUNTER — Telehealth: Payer: Self-pay | Admitting: *Deleted

## 2016-03-03 NOTE — Telephone Encounter (Signed)
Patient's 'foot up' brace has a broke and is not repairable.   Patient was seen by [facility] today and was told to get a new hand written script from Dr. [name] and have it faxed over so a replacement can be ordered.  I informed the patient that the soonest it could be taken care of is 03/05/2015 in the AM

## 2016-03-04 NOTE — Telephone Encounter (Signed)
faxed

## 2016-03-04 NOTE — Telephone Encounter (Signed)
It's written. Please fax over.  thanks

## 2016-03-09 ENCOUNTER — Other Ambulatory Visit: Payer: Self-pay | Admitting: Internal Medicine

## 2016-03-16 ENCOUNTER — Encounter: Payer: Medicare Other | Attending: Physical Medicine & Rehabilitation | Admitting: Physical Medicine & Rehabilitation

## 2016-03-16 ENCOUNTER — Encounter: Payer: Self-pay | Admitting: Physical Medicine & Rehabilitation

## 2016-03-16 VITALS — BP 152/79 | HR 71

## 2016-03-16 DIAGNOSIS — Z87891 Personal history of nicotine dependence: Secondary | ICD-10-CM | POA: Diagnosis not present

## 2016-03-16 DIAGNOSIS — M791 Myalgia: Secondary | ICD-10-CM | POA: Insufficient documentation

## 2016-03-16 DIAGNOSIS — N319 Neuromuscular dysfunction of bladder, unspecified: Secondary | ICD-10-CM | POA: Insufficient documentation

## 2016-03-16 DIAGNOSIS — I491 Atrial premature depolarization: Secondary | ICD-10-CM | POA: Insufficient documentation

## 2016-03-16 DIAGNOSIS — M4714 Other spondylosis with myelopathy, thoracic region: Secondary | ICD-10-CM | POA: Diagnosis not present

## 2016-03-16 DIAGNOSIS — K592 Neurogenic bowel, not elsewhere classified: Secondary | ICD-10-CM | POA: Diagnosis not present

## 2016-03-16 DIAGNOSIS — R5381 Other malaise: Secondary | ICD-10-CM | POA: Diagnosis not present

## 2016-03-16 DIAGNOSIS — R5383 Other fatigue: Secondary | ICD-10-CM | POA: Diagnosis not present

## 2016-03-16 DIAGNOSIS — G839 Paralytic syndrome, unspecified: Secondary | ICD-10-CM

## 2016-03-16 DIAGNOSIS — I4892 Unspecified atrial flutter: Secondary | ICD-10-CM | POA: Insufficient documentation

## 2016-03-16 DIAGNOSIS — I493 Ventricular premature depolarization: Secondary | ICD-10-CM | POA: Diagnosis not present

## 2016-03-16 DIAGNOSIS — R002 Palpitations: Secondary | ICD-10-CM | POA: Insufficient documentation

## 2016-03-16 DIAGNOSIS — R29898 Other symptoms and signs involving the musculoskeletal system: Secondary | ICD-10-CM | POA: Insufficient documentation

## 2016-03-16 DIAGNOSIS — G8220 Paraplegia, unspecified: Secondary | ICD-10-CM | POA: Diagnosis not present

## 2016-03-16 NOTE — Patient Instructions (Signed)
  PLEASE CALL ME WITH ANY PROBLEMS OR QUESTIONS (#[phone]).      

## 2016-03-16 NOTE — Progress Notes (Signed)
Subjective:    Patient ID: [name] Weber, female    DOB: 1931-09-11, 82 y.o.   MRN: [id number]  HPI   [name] Weber is here in follow up of her thoracic myelopathy. She is still frustrated by her lack of perceived progress. She's going to the [employer] where she has a Systems analyst. She is doing a chair aerobics class.    She is not eating all that well. She has lost some weight.   Pain Inventory Average Pain 0 Pain Right Now 0 My pain is no pain  In the last 24 hours, has pain interfered with the following? General activity 0 Relation with others 0 Enjoyment of life 0 What TIME of day is your pain at its worst? morning Sleep (in general) Good  Pain is worse with: standing Pain improves with: no pain except occ and no pain med Relief from Meds: no pain  Mobility use a cane how many minutes can you walk? 30 ability to climb steps?  yes do you drive?  yes  Function I need assistance with the following:  household duties and shopping  Neuro/Psych trouble walking  Prior Studies Any changes since last visit?  no  Physicians involved in your care cardiologist Dietrich Pates   Family History  Problem Relation Age of Onset  . Hyperlipidemia Mother    Social History   Social History  . Marital Status: Divorced    Spouse Name: N/A  . Number of Children: N/A  . Years of Education: N/A   Social History Main Topics  . Smoking status: Former Smoker    Quit date: 03/09/1991  . Smokeless tobacco: None  . Alcohol Use: No  . Drug Use: No  . Sexual Activity: Not Asked   Other Topics Concern  . None   Social History Narrative   Past Surgical History  Procedure Laterality Date  . Vulvar lesion removal  01/01/2009    She had a 1-cm area of erythema to the right of the urethral meatus  . Thoracic laminectomy for epidural abscess N/A 11/16/2014    Procedure: THORACIC LAMINECTOMY FOR EPIDURAL ABSCESS;  Surgeon: Karn Cassis, MD;  Location: [facility];  Service:  Neurosurgery;  Laterality: N/A;   Past Medical History  Diagnosis Date  . Paroxysmal atrial flutter (HCC)   . Palpitations     OCCASSIONAL  . PAC (premature atrial contraction)     ISOLATED  . PVC's (premature ventricular contractions)     ISOLATED  . Malaise   . Fatigue   . Myalgia    BP 152/79 mmHg  Pulse 71  SpO2 98%  Opioid Risk Score:   Fall Risk Score:  `1  Depression screen PHQ 2/9  No flowsheet data found.   Review of Systems  Constitutional: Positive for unexpected weight change.  All other systems reviewed and are negative.      Objective:   Physical Exam  Constitutional: She is oriented to person, place, and time. She appears well-developed.  HENT: oral mucosa moist, dentition good. HOH  Head: Normocephalic and atraumatic.  Eyes: Conjunctivae are normal. Pupils are equal, round, and reactive to light.  Neck: Normal range of motion. Neck supple.  Cardiovascular: Normal rate.  Respiratory: Effort normal and breath sounds normal. No respiratory distress. She has no wheezes.  GI: Soft. Bowel sounds are normal. She exhibits no distension.  Abdomen Non-tender.  Musculoskeletal: She exhibits no edema or tenderness. l Neurological: She is alert and oriented to person, place, and  time.  Impaired proprioception in both legs/LT improved in both legs but this has improved. Strength: 4 4+/5HF,KE bilaterally. RADF 2, RAPF 4+, LADF trace, LAPF 4. Eversion also weak on either side moreso on the left. Still using a substantial steppage gait on the left side to help clear the foot. Steppage pattern on the left as well.  Skin: Skin is warm and dry. Back incision clean,dry and intact.  Psych: pleasant.    Assessment/Plan:  1. Functional deficits secondary to T 10-12 hematoma with myelopathy and paraplegia  -made a referral to neuro rehab/PT to address gait/ balance, AFO.    May need a right AFO.  -spoke with patient regarding realistic expectations after her  injury 2. Neurogenic bowel:  -diet regulated  3. Neurogenic bladder:  -back to baseline   4. Afib: mgt per Dr. Tenny Crawoss  5. Follow up with me in 3 month. .Fifteen minutes of face to face patient care time were spent during this visit. All questions were encouraged and answered.

## 2016-03-30 ENCOUNTER — Ambulatory Visit: Payer: Medicare Other | Admitting: Physical Therapy

## 2016-03-30 ENCOUNTER — Ambulatory Visit: Payer: Medicare Other | Admitting: Physical Medicine & Rehabilitation

## 2016-03-31 ENCOUNTER — Ambulatory Visit: Payer: Medicare Other | Attending: Physical Medicine & Rehabilitation | Admitting: Physical Therapy

## 2016-03-31 DIAGNOSIS — R2681 Unsteadiness on feet: Secondary | ICD-10-CM | POA: Diagnosis present

## 2016-03-31 DIAGNOSIS — M6281 Muscle weakness (generalized): Secondary | ICD-10-CM | POA: Insufficient documentation

## 2016-03-31 DIAGNOSIS — R2689 Other abnormalities of gait and mobility: Secondary | ICD-10-CM | POA: Insufficient documentation

## 2016-04-01 NOTE — Therapy (Signed)
[facility] [address] [place], [place], [address] Phone: [phone]   Fax:  [phone]  Physical Therapy Evaluation  Patient Details  Name: [name] Weber MRN: [id number] Date of Birth: March 04, 1931 Referring Provider: Riley Kill  Encounter Date: 03/31/2016      PT End of Session - 03/31/16 2257    Visit Number 1   Number of Visits 13   Date for PT Re-Evaluation 05/30/16   Authorization Type UHC Medicare    PT Start Time 1104   PT Stop Time 1148   PT Time Calculation (min) 44 min   Activity Tolerance Patient tolerated treatment well   Behavior During Therapy [facility] for tasks assessed/performed      Past Medical History  Diagnosis Date  . Paroxysmal atrial flutter (HCC)   . Palpitations     OCCASSIONAL  . PAC (premature atrial contraction)     ISOLATED  . PVC's (premature ventricular contractions)     ISOLATED  . Malaise   . Fatigue   . Myalgia     Past Surgical History  Procedure Laterality Date  . Vulvar lesion removal  01/01/2009    She had a 1-cm area of erythema to the right of the urethral meatus  . Thoracic laminectomy for epidural abscess N/A 11/16/2014    Procedure: THORACIC LAMINECTOMY FOR EPIDURAL ABSCESS;  Surgeon: Karn Cassis, MD;  Location: [facility];  Service: Neurosurgery;  Laterality: N/A;    There were no vitals filed for this visit.       Subjective Assessment - 03/31/16 1108    Subjective Pt is an 82 year old female who presents to OP PT with history of T9 paraplegia, who is known to this therapist from previous bout of therapy.  Pt notes that she has had L foot drop for at least 6 months, with increasing foot drop on R in the past  2 months.  Pt reports feeling "like she is going backwards" as far as strength is concerned.  She reprots "absolutely no balance."  She uses a cane for mobility.   Patient Stated Goals Pt's goals for therapy are to get out of chair without UE  support, feel more secure on feet.   Currently in Pain? No/denies            [facility] PT Assessment - 03/31/16 1112    Assessment   Medical Diagnosis T9 paraplegia   Referring Provider Riley Kill   Precautions   Precautions Fall   Required Braces or Orthoses Other Brace/Splint   Other Brace/Splint Wears L foot-up brace   Balance Screen   Has the patient fallen in the past 6 months No   Has the patient had a decrease in activity level because of a fear of falling?  Yes   Is the patient reluctant to leave their home because of a fear of falling?  No   Home Environment   Living Environment Private residence   Living Arrangements Alone   Type of Home House   Home Access Stairs to enter   Entrance Stairs-Number of Steps 7   Entrance Stairs-Rails Right   Home Layout One level   Home Equipment Shower seat;Walker - 2 wheels;Hand held shower head   Prior Function   Level of Independence Independent with basic ADLs;Independent with household mobility with device;Independent with community mobility with device  Does own housework and cooking   Leisure Exercises at [employer], has trainer at [employer]   Observation/Other Assessments  Focus on Therapeutic Outcomes (FOTO)  Functional Status Intake measure:  57   Sensation   Light Touch Impaired by gross assessment   Posture/Postural Control   Posture/Postural Control Postural limitations   Posture Comments Pt stands with widened BOS, excessive "hanging" on ligaments at hips; pt also stands with L shoulder/trunk posteriorly rotated.   ROM / Strength   AROM / PROM / Strength AROM;Strength;PROM   PROM   Overall PROM  Deficits   Overall PROM Comments R ankle dorsiflexion 7 degrees beyond neutral; L ankle dorsiflexion -2 degrees from neutral    Strength   Overall Strength Deficits   Strength Assessment Site Hip;Knee;Ankle   Right/Left Hip Right;Left   Right Hip Flexion 3+/5   Left Hip Flexion 3+/5   Right/Left Knee Right;Left   Right  Knee Flexion 4/5   Right Knee Extension 4/5   Left Knee Flexion 3+/5   Left Knee Extension 4/5   Right/Left Ankle Right;Left   Right Ankle Dorsiflexion 2/5   Left Ankle Dorsiflexion 1/5   Transfers   Transfers Sit to Stand;Stand to Sit   Sit to Stand 5: Supervision;With upper extremity assist;With armrests;From chair/3-in-1   Sit to Stand Details (indicate cue type and reason) Requires use of UE support to stand   Stand to Sit 5: Supervision;With upper extremity assist;To chair/3-in-1;With armrests   Ambulation/Gait   Ambulation/Gait Yes   Ambulation/Gait Assistance 4: Min guard;5: Supervision   Ambulation Distance (Feet) 70 Feet   Assistive device Straight cane  wears L foot-up brace   Gait Pattern Step-through pattern;Decreased step length - right;Decreased step length - left;Decreased dorsiflexion - right;Decreased dorsiflexion - left;Right steppage;Left steppage;Decreased trunk rotation;Trunk rotated posteriorly on left;Wide base of support   Ambulation Surface Level;Indoor   Gait velocity 15.60 sec = 2.10 ft/sec   Standardized Balance Assessment   Standardized Balance Assessment Timed Up and Go Test;Berg Balance Test   Berg Balance Test   Sit to Stand Able to stand  independently using hands   Standing Unsupported Able to stand 2 minutes with supervision   Sitting with Back Unsupported but Feet Supported on Floor or Stool Able to sit safely and securely 2 minutes   Stand to Sit Controls descent by using hands   Transfers Needs one person to assist   Standing Unsupported with Eyes Closed Able to stand 10 seconds with supervision   Standing Ubsupported with Feet Together Needs help to attain position and unable to hold for 15 seconds   From Standing, Reach Forward with Outstretched Arm Can reach confidently >25 cm (10")   From Standing Position, Pick up Object from Floor Unable to try/needs assist to keep balance   From Standing Position, Turn to Look Behind Over each Shoulder  Needs supervision when turning   Turn 360 Degrees Needs assistance while turning   Standing Unsupported, Alternately Place Feet on Step/Stool Needs assistance to keep from falling or unable to try   Standing Unsupported, One Foot in [employer] balance while stepping or standing   Standing on One Leg Unable to try or needs assist to prevent fall   Total Score 22   Berg comment: Scores < 45/56 are indicative of increased fall risk.   Timed Up and Go Test   Normal TUG (seconds) 21.1                           PT Education - 03/31/16 2250    Education provided Yes  Education Details Educated patient on POC; discussed need for stretching hamstrings/gastrocs-demonstrated gastroc stretch; discussed/demo active dorsiflexion and cautioned pt not to overdo active plantarflexion strengthening   Person(s) Educated Patient   Methods Demonstration;Explanation   Comprehension Verbalized understanding          PT Short Term Goals - 04/01/16 0748    PT SHORT TERM GOAL #1   Title Pt will be independent with HEP for improved strength, balance, and gait.  TARGET 04/30/16   Time 4   Period Weeks   Status New   PT SHORT TERM GOAL #2   Title Pt will improve TUG score to less than or equal to 17 seconds for decreased fall risk.   Time 4   Period Weeks   Status New   PT SHORT TERM GOAL #3   Title Pt will improve Berg Balance score to at least 27/56 for decreased fall risk.   Time 4   Period Weeks   Status New   PT SHORT TERM GOAL #4   Title Pt will perform at least 6 of 10 reps of sit<>stand transfers with minimal UE support for 18-24" surfaces, for improved lower extremity strength and ease of transfers.   Time 4   Period Weeks   Status New   PT SHORT TERM GOAL #5   Title Formal AFO consult to be initiated to address bilateral lower extremity foot drop.   Time 4   Period Weeks   Status New           PT Long Term Goals - 04/01/16 0754    PT LONG TERM GOAL #1   Title  Pt will verbalize understanding of fall prevention within home environment.  TARGET 05/30/16   Time 8   Period Weeks   Status New   PT LONG TERM GOAL #2   Title Pt will improve TUG score to less than or equal to 13.5 seconds for decreased fall risk.   Time 8   Period Weeks   Status New   PT LONG TERM GOAL #3   Title Pt will improve Berg Balance score to at least 37/56 for decreased fall risk.   Time 8   Period Weeks   Status New   PT LONG TERM GOAL #4   Title Pt will improve gait velocity to at least 2.62 ft/sec for improved efficiency and safety with household and community gait.   Time 8   Period Weeks   Status New               Plan - 03/31/16 2259    Clinical Impression Statement Pt is an 82 year old female who presents to OP PT for moderate complexity evaluation with a  history of paraplegia following T9 myelopathy.  In the past six months, pt reports decreased lower leg strength, decreased ability to clear foot with gait with increased steppage gait pattern.  Pt presents with postural abnormality, decreased flexibility/ROM, decreased muscle strength, decreased independent transfer ability, decreased balance, decreased independent gait.  Pt presents as a fall risk per Berg score of 22/56, TUG score of 21.1 sec.  Pt's Berg score when previously seeing therapist was 44/56 and TUg score was 12.66 sec with no device.  Pt has decreased gait speed of 2.1 ft/sec.  Pt demonstrates decreased functional mobility over the past 6 months and would benefit from skilled PT to address the above stated deficits for improved functional mobility and decreased fall risk.    Rehab Potential Good  PT Frequency 2x / week   PT Duration 8 weeks  plus eval   PT Treatment/Interventions ADLs/Self Care Home Management;Therapeutic exercise;Therapeutic activities;Functional mobility training;Gait training;Balance training;Neuromuscular re-education;Patient/family education;Orthotic Fit/Training;Electrical  Stimulation   PT Next Visit Plan Initiate hamstring and heelcord stretches, lower extremity and trunk strengthening; trial of appropriate AFOs   Consulted and Agree with Plan of Care Patient      Patient will benefit from skilled therapeutic intervention in order to improve the following deficits and impairments:  Abnormal gait, Decreased mobility, Decreased range of motion, Decreased strength, Difficulty walking, Impaired flexibility, Postural dysfunction  Visit Diagnosis: Other abnormalities of gait and mobility  Unsteadiness on feet  Muscle weakness (generalized)      G-Codes - 03/31/16 0757    Functional Assessment Tool Used Sharlene MottsBerg 22/56, gait velocity 2.1 ft/sec, TUG score 21.1 sec   Functional Limitation Mobility: Walking and moving around   Mobility: Walking and Moving Around Current Status [phone]([id number]) At least 60 percent but less than 80 percent impaired, limited or restricted   Mobility: Walking and Moving Around Goal Status [phone]([id number]) At least 20 percent but less than 40 percent impaired, limited or restricted       Problem List Patient Active Problem List   Diagnosis Date Noted  . Anxiety state 12/10/2014  . Neurogenic bladder 11/30/2014  . Neurogenic bowel 11/30/2014  . Bacterial UTI 11/30/2014  . Thoracic myelopathy 11/29/2014  . Persistent atrial fibrillation (HCC) 11/27/2014  . Traumatic spinal subdural hematoma (HCC)   . Acute pulmonary embolism (HCC)   . Orthostatic hypotension 11/22/2014  . Near syncope 11/21/2014  . Paraplegia at T9 level (HCC) 11/17/2014  . Weakness of both legs   . Nocturnal leg cramps 07/16/2014  . Encounter for therapeutic drug monitoring 12/25/2013  . Malaise and fatigue 06/01/2011  . Atrial flutter (HCC) 03/10/2011  . Long term (current) use of anticoagulants 03/10/2011  . Benign hypertensive heart disease without heart failure 03/10/2011  . Hypercholesterolemia 03/10/2011  . Paroxysmal atrial flutter (HCC)   . Palpitations   . PAC  (premature atrial contraction)   . PVC's (premature ventricular contractions)   . Malaise   . Fatigue   . Myalgia     Danzel Marszalek W. 04/01/2016, 7:58 AM  Gean MaidensMARRIOTT,Shalea Tomczak W., PT  [place] [facility] [address] [place], [place], [address] Phone: [phone]   Fax:  [phone]  Name: [name] Weber MRN: [id number] Date of Birth: 11/04/1931

## 2016-04-10 ENCOUNTER — Ambulatory Visit: Payer: Medicare Other | Admitting: Physical Therapy

## 2016-04-10 DIAGNOSIS — R2689 Other abnormalities of gait and mobility: Secondary | ICD-10-CM

## 2016-04-10 DIAGNOSIS — M6281 Muscle weakness (generalized): Secondary | ICD-10-CM

## 2016-04-10 NOTE — Patient Instructions (Signed)
HIP: Hamstrings - Short Sitting    Rest leg on raised surface or on the floor. Keep knee straight and keep your chest tall. You should feel a gentle stretch behind your knee.  You can lean your chest forward to feel more of a stretch.  Hold _30__ seconds. _3__ reps per set, _2-3__ sets per day.  Copyright  VHI. All rights reserved.  Bridging    Slowly raise buttocks from floor, keeping stomach tight and your back flat.  Hold 3 seconds and slowly lower down. Repeat __10__ times per set.  Do __1-2__ sessions per day.  http://orth.exer.us/1097   Copyright  VHI. All rights reserved.  HIP: Flexion / KNEE: Extension, Straight Leg Raise    Raise leg, keeping knee straight. Perform slowly and hold in air 3 seconds. _2 sets of 5_ reps per set, _1-2__ sets per day   Copyright  VHI. All rights reserved.  Abduction: Clam (Eccentric) - Side-Lying    Lie on side with knees bent. Lift top knee, keeping feet together. Keep trunk steady. Hold for 3-5 seconds. __10_ reps per set, 1-2___ sets per day. Add ___ lbs when you achieve ___ repetitions.  http://ecce.exer.us/65   Copyright  VHI. All rights reserved.  ABDUCTION: Side-Lying (Active)    Lie on right side, top leg straight. Raise top leg 2-3 inches, keeping your hips stacked and legs in line with your body. Complete _2 sets of 5__  repetitions. Perform _1-2__ sessions per day.  (Do this with left leg only)  http://gtsc.exer.us/95   Copyright  VHI. All rights reserved.  Ankle: Dorsiflexion - Non-WB    Sit with legs off bench or bed. Lift toes of left foot without moving leg. Lift and hold for 3 seconds then lower slowly. Repeat __10__ times per set. Do 1-2 times per day. Copyright  VHI. All rights reserved.

## 2016-04-10 NOTE — Therapy (Signed)
[facility] [address] [place], [place], [address] Phone: [phone]   Fax:  [phone]  Physical Therapy Treatment  Patient Details  Name: [name] Weber MRN: [id number] Date of Birth: 12/23/1930 Referring Provider: [name]  Encounter Date: 04/10/2016      PT End of Session - 04/10/16 0943    Visit Number 2   Number of Visits 17  per 2x/wk 8 weeks in POC-AWM   Date for PT Re-Evaluation 05/30/16   Authorization Type UHC Medicare    PT Start Time 0802   PT Stop Time 0850   PT Time Calculation (min) 48 min   Activity Tolerance Patient tolerated treatment well   Behavior During Therapy [facility] for tasks assessed/performed      Past Medical History  Diagnosis Date  . Paroxysmal atrial flutter (HCC)   . Palpitations     OCCASSIONAL  . PAC (premature atrial contraction)     ISOLATED  . PVC's (premature ventricular contractions)     ISOLATED  . Malaise   . Fatigue   . Myalgia     Past Surgical History  Procedure Laterality Date  . Vulvar lesion removal  01/01/2009    She had a 1-cm area of erythema to the right of the urethral meatus  . Thoracic laminectomy for epidural abscess N/A 11/16/2014    Procedure: THORACIC LAMINECTOMY FOR EPIDURAL ABSCESS;  Surgeon: Karn CassisErnesto M Botero, MD;  Location: [facility];  Service: Neurosurgery;  Laterality: N/A;    There were no vitals filed for this visit.      Subjective Assessment - 04/10/16 0805    Subjective Pt comes in today with neighbor who is concerned about her regression of symptoms. She reports that her L foot was drawing in last night while in bed.   Patient Stated Goals Pt's goals for therapy are to get out of chair without UE support, feel more secure on feet.   Currently in Pain? No/denies                         [facility] Adult PT Treatment/Exercise - 04/10/16 0808    Transfers   Transfers Sit to Stand;Stand to Sit   Sit to Stand 4:  Min guard;4: Min assist;Without upper extremity assist;From elevated surface   Sit to Stand Details (indicate cue type and reason) PT provides cues for quad and glut activation in standing; min assist at hips to prevent excessive forward lean in standing.   Stand to Sit 4: Min guard;Without upper extremity assist;To elevated surface   Number of Reps 10 reps   Comments For functional lower extremity strengthening   Self-Care   Self-Care Other Self-Care Comments   Other Self-Care Comments  Pt's neighbor/healthcare POA, Vye, present at session today due to concern regarding pt's progressing weakness and functional limitations.  Answered questions regarding gym/water aerobics type exercise-PT recommends major focus right now to be exercises given through PT to target specific lower extremity muscles and to avoid overdoing it with gym, trainer, water exercises.  Discussed possibility of additional support needed with an AFO, but did not have time in session to trial.  Instructed patient and friend on proper way to don foot-up brace for optimal positioning.   Exercises   Exercises Knee/Hip;Ankle   Knee/Hip Exercises: Stretches   Active Hamstring Stretch 3 reps;30 seconds;Right;Left   Knee/Hip Exercises: Supine   Bridges Strengthening;10 reps  3 second hold, cues for abdominal activation  Straight Leg Raises Strengthening;Right;Left;2 sets;5 reps  cues for quad activation to prevent extensor lag   Knee/Hip Exercises: Sidelying   Hip ABduction Strengthening;Left;2 sets;5 sets  attempted RLE, with hip flex compensations   Hip ABduction Limitations Performed clamshell on RLE due to hip flexor compensations in sidelying hip abduction on RLE   Clams 10 reps with 3 second hold RLE   Ankle Exercises: Stretches   Gastroc Stretch 1 rep;30 seconds  seated using belt for stretch   Gastroc Stretch Limitations Pt reports not feeling stretch in calf   Ankle Exercises: Seated   Toe Raise 10 reps;3 seconds    Toe Raise Limitations limited active ROM LLE> RLE; attempted to use belt for active assist; but pt able to use opposite foot to assist with ankle dorsiflexion with better positioning                PT Education - 04/10/16 0942    Education provided Yes   Education Details Initiated HEP for lower extremity strengthening-see instructions   Person(s) Educated Patient;Caregiver(s)   Methods Explanation;Demonstration;Handout   Comprehension Verbalized understanding;Returned demonstration;Need further instruction          PT Short Term Goals - 04/01/16 0748    PT SHORT TERM GOAL #1   Title Pt will be independent with HEP for improved strength, balance, and gait.  TARGET 04/30/16   Time 4   Period Weeks   Status New   PT SHORT TERM GOAL #2   Title Pt will improve TUG score to less than or equal to 17 seconds for decreased fall risk.   Time 4   Period Weeks   Status New   PT SHORT TERM GOAL #3   Title Pt will improve Berg Balance score to at least 27/56 for decreased fall risk.   Time 4   Period Weeks   Status New   PT SHORT TERM GOAL #4   Title Pt will perform at least 6 of 10 reps of sit<>stand transfers with minimal UE support for 18-24" surfaces, for improved lower extremity strength and ease of transfers.   Time 4   Period Weeks   Status New   PT SHORT TERM GOAL #5   Title Formal AFO consult to be initiated to address bilateral lower extremity foot drop.   Time 4   Period Weeks   Status New           PT Long Term Goals - 04/01/16 0754    PT LONG TERM GOAL #1   Title Pt will verbalize understanding of fall prevention within home environment.  TARGET 05/30/16   Time 8   Period Weeks   Status New   PT LONG TERM GOAL #2   Title Pt will improve TUG score to less than or equal to 13.5 seconds for decreased fall risk.   Time 8   Period Weeks   Status New   PT LONG TERM GOAL #3   Title Pt will improve Berg Balance score to at least 37/56 for decreased fall risk.    Time 8   Period Weeks   Status New   PT LONG TERM GOAL #4   Title Pt will improve gait velocity to at least 2.62 ft/sec for improved efficiency and safety with household and community gait.   Time 8   Period Weeks   Status New               Plan - 04/10/16 0944    Clinical  Impression Statement Pt and caregiver seem frustrated regarding pt's regression since last bout of therapy.  Treatment session focused on initiating targeted HEP to address lower extremity weakness and tightness.  Pt requires verbal cues throughout session to slow pace and for hold to improve muscle activation and strength.  Pt will continue to benefit from further skilled PT to address weakness, balance and gait.   Rehab Potential Good   PT Frequency 2x / week   PT Duration 8 weeks  plus eval   PT Treatment/Interventions ADLs/Self Care Home Management;Therapeutic exercise;Therapeutic activities;Functional mobility training;Gait training;Balance training;Neuromuscular re-education;Patient/family education;Orthotic Fit/Training;Electrical Stimulation   PT Next Visit Plan Review HEP given this visit; trial of appropriate AFOs, discuss rollator walker use vs. cane for improved gait safety.   Consulted and Agree with Plan of Care Patient;Family member/caregiver      Patient will benefit from skilled therapeutic intervention in order to improve the following deficits and impairments:  Abnormal gait, Decreased mobility, Decreased range of motion, Decreased strength, Difficulty walking, Impaired flexibility, Postural dysfunction  Visit Diagnosis: Other abnormalities of gait and mobility  Muscle weakness (generalized)     Problem List Patient Active Problem List   Diagnosis Date Noted  . Anxiety state 12/10/2014  . Neurogenic bladder 11/30/2014  . Neurogenic bowel 11/30/2014  . Bacterial UTI 11/30/2014  . Thoracic myelopathy 11/29/2014  . Persistent atrial fibrillation (HCC) 11/27/2014  . Traumatic spinal  subdural hematoma (HCC)   . Acute pulmonary embolism (HCC)   . Orthostatic hypotension 11/22/2014  . Near syncope 11/21/2014  . Paraplegia at T9 level (HCC) 11/17/2014  . Weakness of both legs   . Nocturnal leg cramps 07/16/2014  . Encounter for therapeutic drug monitoring 12/25/2013  . Malaise and fatigue 06/01/2011  . Atrial flutter (HCC) 03/10/2011  . Long term (current) use of anticoagulants 03/10/2011  . Benign hypertensive heart disease without heart failure 03/10/2011  . Hypercholesterolemia 03/10/2011  . Paroxysmal atrial flutter (HCC)   . Palpitations   . PAC (premature atrial contraction)   . PVC's (premature ventricular contractions)   . Malaise   . Fatigue   . Myalgia     [name] Weber. 04/10/2016, 9:53 AM  Gean Maidens., PT  [place] [facility] [address] [place], [place], [address] Phone: [phone]   Fax:  [phone]  Name: [name] Weber MRN: [id number] Date of Birth: 19-Dec-1930

## 2016-04-15 ENCOUNTER — Ambulatory Visit: Payer: Medicare Other | Admitting: Physical Therapy

## 2016-04-15 DIAGNOSIS — R2689 Other abnormalities of gait and mobility: Secondary | ICD-10-CM | POA: Diagnosis not present

## 2016-04-15 DIAGNOSIS — M6281 Muscle weakness (generalized): Secondary | ICD-10-CM

## 2016-04-15 NOTE — Therapy (Signed)
[facility] [address] [place], [place], [address] Phone: [phone]   Fax:  [phone]  Physical Therapy Treatment  Patient Details  Name: [name] Weber MRN: [id number] Date of Birth: [birth year] Referring Provider: Riley Kill  Encounter Date: 04/15/2016      PT End of Session - 04/15/16 2049    Visit Number 3   Number of Visits 17  per 2x/wk 8 weeks in POC-AWM   Date for PT Re-Evaluation 05/30/16   Authorization Type [employer] Medicare    PT Start Time 0847   PT Stop Time 0928   PT Time Calculation (min) 41 min   Equipment Utilized During Treatment Gait belt   Activity Tolerance Patient tolerated treatment well   Behavior During Therapy [facility] for tasks assessed/performed      Past Medical History  Diagnosis Date  . Paroxysmal atrial flutter (HCC)   . Palpitations     OCCASSIONAL  . PAC (premature atrial contraction)     ISOLATED  . PVC's (premature ventricular contractions)     ISOLATED  . Malaise   . Fatigue   . Myalgia     Past Surgical History  Procedure Laterality Date  . Vulvar lesion removal  01/01/2009    She had a 1-cm area of erythema to the right of the urethral meatus  . Thoracic laminectomy for epidural abscess N/A 11/16/2014    Procedure: THORACIC LAMINECTOMY FOR EPIDURAL ABSCESS;  Surgeon: Karn Cassis, MD;  Location: [facility];  Service: Neurosurgery;  Laterality: N/A;    There were no vitals filed for this visit.      Subjective Assessment - 04/15/16 0850    Subjective pt says she is frustrated with lack of progress.  Larey Seat last night-legs gave way trying to sit at sofa.  Able to pull to sofa and pull up.  No pain, no injuries from fall.   Patient Stated Goals Pt's goals for therapy are to get out of chair without UE support, feel more secure on feet.   Currently in Pain? No/denies                         [facility] Adult PT Treatment/Exercise - 04/15/16 0853     Ambulation/Gait   Ambulation/Gait Yes   Ambulation/Gait Assistance 5: Supervision;4: Min guard   Ambulation/Gait Assistance Details Trial of blue rocker on LLE with improved LLE foot clearance with improved L heelstrike, but noted L knee remains in slight flexion in stance phase.  Trial of toe-off AFO on LLE, with improved LLE foot clearance and heelstrike, as well as improved terminal knee extension in stance phase.  Also, with toe-off AFO on LLE, pt is able to have more normal gait pattern (decreased steppage gait) on RLE.   Ambulation Distance (Feet) 100 Feet  then 200 ft x 2 with trial of AFOs   Assistive device Rollator   Gait Pattern Step-through pattern;Decreased stance time - right;Decreased step length - right;Decreased step length - left;Narrow base of support  with AFO trial-see notes above   Ambulation Surface Level;Indoor   Self-Care   Self-Care Other Self-Care Comments   Other Self-Care Comments  Discussed safety with gait in the home s/p fall last night.  Espeicially discussed need for using rollator versus cane for improved safety.  Pt agrees to using rollator in home versus cane.  Discussed trial of AFOs, with pt feeling L toe-off AFO helped to normalize gait pattern.  Pt in  agreement to initiate orthotist consult for LLE AFO and possible R AFO.   Exercises   Exercises Knee/Hip;Ankle  Review of HEP given last visit-pt return demo understanding   Knee/Hip Exercises: Stretches   Active Hamstring Stretch Right;Left;2 reps;30 seconds   Knee/Hip Exercises: Seated   Long Arc Quad AROM;Strengthening;Right;Left;5 reps  Cues for upright posture due to posterior trunk lean   Marching AROM;Strengthening;Right;Left;10 reps  Cues for upright posture due to posterior trunk lean   Hamstring Curl AROM;Right;Left;10 reps  Heelslide seated   Knee/Hip Exercises: Supine   Bridges Strengthening;10 reps  3 second hold with abdominal activation   Straight Leg Raises Strengthening;Right;Left;10  reps  cues for quad activation to prevent extensor lag   Other Supine Knee/Hip Exercises Hooklying march x 10 reps    Knee/Hip Exercises: Sidelying   Hip ABduction Strengthening;Left;10 reps   Hip ABduction Limitations Performed clamshell on RLE due to hip flexor compensations in sidelying hip abduction on RLE   Clams 10 reps with 3 second hold RLE                PT Education - 04/15/16 2032    Education provided Yes   Education Details Gait safety, needing to use rollator versus cane; trial of AFOs and plans to pursue orthotist consult   Person(s) Educated Patient   Methods Explanation;Demonstration   Comprehension Verbalized understanding;Returned demonstration          PT Short Term Goals - 04/01/16 0748    PT SHORT TERM GOAL #1   Title Pt will be independent with HEP for improved strength, balance, and gait.  TARGET 04/30/16   Time 4   Period Weeks   Status New   PT SHORT TERM GOAL #2   Title Pt will improve TUG score to less than or equal to 17 seconds for decreased fall risk.   Time 4   Period Weeks   Status New   PT SHORT TERM GOAL #3   Title Pt will improve Berg Balance score to at least 27/56 for decreased fall risk.   Time 4   Period Weeks   Status New   PT SHORT TERM GOAL #4   Title Pt will perform at least 6 of 10 reps of sit<>stand transfers with minimal UE support for 18-24" surfaces, for improved lower extremity strength and ease of transfers.   Time 4   Period Weeks   Status New   PT SHORT TERM GOAL #5   Title Formal AFO consult to be initiated to address bilateral lower extremity foot drop.   Time 4   Period Weeks   Status New           PT Long Term Goals - 04/01/16 0754    PT LONG TERM GOAL #1   Title Pt will verbalize understanding of fall prevention within home environment.  TARGET 05/30/16   Time 8   Period Weeks   Status New   PT LONG TERM GOAL #2   Title Pt will improve TUG score to less than or equal to 13.5 seconds for decreased  fall risk.   Time 8   Period Weeks   Status New   PT LONG TERM GOAL #3   Title Pt will improve Berg Balance score to at least 37/56 for decreased fall risk.   Time 8   Period Weeks   Status New   PT LONG TERM GOAL #4   Title Pt will improve gait velocity to at least 2.62 ft/sec  for improved efficiency and safety with household and community gait.   Time 8   Period Weeks   Status New               Plan - 04/15/16 2050    Clinical Impression Statement Treatment session focused on review of HEP given last visit; no new seated lower extremity exercises added, as pt noted to have decreased trunk strength with posterior lean in sitting.  Also focused on gait trials with AFOs using rollator walker.  Pt appears to have decreased steppage gait, with improved foot clearance, heelstrike, step length and terminal knee extension with toe-off AFO.  PT will continue to benefit from further skilled PT to address weakness, balance, and gait.   Rehab Potential Good   PT Frequency 2x / week   PT Duration 8 weeks  plus eval   PT Treatment/Interventions ADLs/Self Care Home Management;Therapeutic exercise;Therapeutic activities;Functional mobility training;Gait training;Balance training;Neuromuscular re-education;Patient/family education;Orthotic Fit/Training;Electrical Stimulation   PT Next Visit Plan Work on trunk/core stability, progress exercises for lower extremity strengthening; continue gait trails with AFO(s); AFO consult/obtain order from Dr. Riley Kill   Recommended Other Services AFO consult   Consulted and Agree with Plan of Care Patient;Family member/caregiver      Patient will benefit from skilled therapeutic intervention in order to improve the following deficits and impairments:  Abnormal gait, Decreased mobility, Decreased range of motion, Decreased strength, Difficulty walking, Impaired flexibility, Postural dysfunction  Visit Diagnosis: Other abnormalities of gait and  mobility  Muscle weakness (generalized)     Problem List Patient Active Problem List   Diagnosis Date Noted  . Anxiety state 12/10/2014  . Neurogenic bladder 11/30/2014  . Neurogenic bowel 11/30/2014  . Bacterial UTI 11/30/2014  . Thoracic myelopathy 11/29/2014  . Persistent atrial fibrillation (HCC) 11/27/2014  . Traumatic spinal subdural hematoma (HCC)   . Acute pulmonary embolism (HCC)   . Orthostatic hypotension 11/22/2014  . Near syncope 11/21/2014  . Paraplegia at T9 level (HCC) 11/17/2014  . Weakness of both legs   . Nocturnal leg cramps 07/16/2014  . Encounter for therapeutic drug monitoring 12/25/2013  . Malaise and fatigue 06/01/2011  . Atrial flutter (HCC) 03/10/2011  . Long term (current) use of anticoagulants 03/10/2011  . Benign hypertensive heart disease without heart failure 03/10/2011  . Hypercholesterolemia 03/10/2011  . Paroxysmal atrial flutter (HCC)   . Palpitations   . PAC (premature atrial contraction)   . PVC's (premature ventricular contractions)   . Malaise   . Fatigue   . Myalgia     Arianah Torgeson W. 04/15/2016, 8:56 PM Gean Maidens., PT [facility] [address] [place], [place], [address] Phone: [phone]   Fax:  [phone]  Name: CORTNIE RINGEL MRN: [id number] Date of Birth: 1931-10-02

## 2016-04-17 ENCOUNTER — Ambulatory Visit: Payer: Medicare Other | Admitting: Physical Therapy

## 2016-04-17 ENCOUNTER — Telehealth: Payer: Self-pay | Admitting: Physical Therapy

## 2016-04-17 DIAGNOSIS — R2689 Other abnormalities of gait and mobility: Secondary | ICD-10-CM | POA: Diagnosis not present

## 2016-04-17 DIAGNOSIS — M6281 Muscle weakness (generalized): Secondary | ICD-10-CM

## 2016-04-17 NOTE — Telephone Encounter (Signed)
I am glad she's willing to use them now!  Thanks for your help.  Can we put together an RX for [facility], and I'll sign on Monday.    Thanks! ZTS

## 2016-04-17 NOTE — Patient Instructions (Addendum)
KNEE: Extension, Short Arc Quads - Supine    Place bolster under knees. Raise one leg until knee is straight. ___ reps per set, ___ sets per day, ___ days per week   Copyright  VHI. All rights reserved.    High Stepping in Place (Sitting)    Sitting, alternately lift knees as high as possible. Keep torso erect. Repeat ____ times, each leg.  Copyright  VHI. All rights reserved.

## 2016-04-17 NOTE — Therapy (Signed)
[facility] [address] [place], [place], [address] Phone: [phone]   Fax:  [phone]  Physical Therapy Treatment  Patient Details  Name: [name] Weber MRN: [id number] Date of Birth: 08/06/1931 Referring Provider: [name]  Encounter Date: 04/17/2016      PT End of Session - 04/17/16 0905    Visit Number 4   Number of Visits 17  per 2x/wk 8 weeks in POC-AWM   Date for PT Re-Evaluation 05/30/16   Authorization Type [employer]HC Medicare    PT Start Time 0759   PT Stop Time 0846   PT Time Calculation (min) 47 min   Activity Tolerance Patient tolerated treatment well   Behavior During Therapy [facility] for tasks assessed/performed      Past Medical History  Diagnosis Date  . Paroxysmal atrial flutter (HCC)   . Palpitations     OCCASSIONAL  . PAC (premature atrial contraction)     ISOLATED  . PVC's (premature ventricular contractions)     ISOLATED  . Malaise   . Fatigue   . Myalgia     Past Surgical History  Procedure Laterality Date  . Vulvar lesion removal  01/01/2009    She had a 1-cm area of erythema to the right of the urethral meatus  . Thoracic laminectomy for epidural abscess N/A 11/16/2014    Procedure: THORACIC LAMINECTOMY FOR EPIDURAL ABSCESS;  Surgeon: Karn CassisErnesto M Botero, MD;  Location: [facility];  Service: Neurosurgery;  Laterality: N/A;    There were no vitals filed for this visit.      Subjective Assessment - 04/17/16 0801    Subjective Been using the walker at home, no falls.  Just hard to get the walker in and out of the car, so I have my cane today.   Patient Stated Goals Pt's goals for therapy are to get out of chair without UE support, feel more secure on feet.   Currently in Pain? No/denies                         [facility] Treatment/Exercise - 04/17/16 0803    Transfers   Transfers Sit to Stand;Stand to Sit   Sit to Stand 4: Min guard;Without upper extremity  assist;From elevated surface  simulating bed height:  at least 28"   Sit to Stand Details (indicate cue type and reason) PT provides verbal and tactile cues for pt to improve forward lean, glut/quad activation upon standing, tactile cues provided at hips to prevent excessive anterior lean through hips.   Stand to Sit 4: Min guard;Without upper extremity assist;To elevated surface   Number of Reps 10 reps;Other sets (comment)  then 5 reps   Comments For functional lower extremity strengthening   Ambulation/Gait   Ambulation/Gait Yes   Ambulation/Gait Assistance 5: Supervision;4: Min guard   Ambulation/Gait Assistance Details Trial of LLE toe-off AFO and foot up brace on RLE   Ambulation Distance (Feet) 500 Feet  100   Assistive device Rollator   Gait Pattern Step-through pattern;Decreased stance time - right;Decreased step length - right;Decreased step length - left;Narrow base of support  improved with L toe-off AFO   Ambulation Surface Level;Indoor   Gait Comments For last 100 ft of gait, pt ambulates without AFO or foot up using rollator:  PT provides verbal cues for heelstrike and increased step length.   Exercises   Exercises Knee/Hip;Ankle   Knee/Hip Exercises: Stretches   Active Naval architectHamstring Stretch  Right;Left;30 seconds;3 reps   Knee/Hip Exercises: Seated   Long Arc Quad AROM;Strengthening;Right;Left;10 reps  Cues for upright trunk due to initial posterior lean   Marching AROM;Strengthening;Right;Left;10 reps;2 sets  cues for upright posture/abdominal activation   Hamstring Curl Strengthening;Right;Left;2 sets;10 reps;Weights  red theraband   Knee/Hip Exercises: Supine   Short Arc Quad Sets Strengthening;Right;Left;10 reps  2# weights   Bridges Strengthening;10 reps;2 sets  3 second hold with abdominal activation   Straight Leg Raises Strengthening;Right;Left;10 reps   Knee/Hip Exercises: Sidelying   Hip ABduction Strengthening;Left;10 reps;2 sets   Hip ABduction Limitations  Performed clamshell on RLE due to hip flexor compensations in sidelying hip abduction on RLE   Clams 10 reps with 3 second hold RLE  2 sets                PT Education - 04/17/16 0905    Education provided Yes   Education Details HEP additions-seating marching, SAQ; adding second set of 10 reps to [employer]) Educated Patient   Methods Explanation;Demonstration   Comprehension Verbalized understanding;Returned demonstration          PT Short Term Goals - 04/01/16 0748    PT SHORT TERM GOAL #1   Title Pt will be independent with HEP for improved strength, balance, and gait.  TARGET 04/30/16   Time 4   Period Weeks   Status New   PT SHORT TERM GOAL #2   Title Pt will improve TUG score to less than or equal to 17 seconds for decreased fall risk.   Time 4   Period Weeks   Status New   PT SHORT TERM GOAL #3   Title Pt will improve Berg Balance score to at least 27/56 for decreased fall risk.   Time 4   Period Weeks   Status New   PT SHORT TERM GOAL #4   Title Pt will perform at least 6 of 10 reps of sit<>stand transfers with minimal UE support for 18-24" surfaces, for improved lower extremity strength and ease of transfers.   Time 4   Period Weeks   Status New   PT SHORT TERM GOAL #5   Title Formal AFO consult to be initiated to address bilateral lower extremity foot drop.   Time 4   Period Weeks   Status New           PT Long Term Goals - 04/01/16 0754    PT LONG TERM GOAL #1   Title Pt will verbalize understanding of fall prevention within home environment.  TARGET 05/30/16   Time 8   Period Weeks   Status New   PT LONG TERM GOAL #2   Title Pt will improve TUG score to less than or equal to 13.5 seconds for decreased fall risk.   Time 8   Period Weeks   Status New   PT LONG TERM GOAL #3   Title Pt will improve Berg Balance score to at least 37/56 for decreased fall risk.   Time 8   Period Weeks   Status New   PT LONG TERM GOAL #4   Title Pt will  improve gait velocity to at least 2.62 ft/sec for improved efficiency and safety with household and community gait.   Time 8   Period Weeks   Status New               Plan - 04/17/16 0906    Clinical Impression Statement Treatment session focused on progression  of lower extremity exercises and gait training.  Pt demonstrates good tolerance to increasing second set of HEP exercises.  Pt demonstrates improved awareness during PT session  with gait using rollator walker, with and without braces, for improved step length and heelstrike with gait.   Rehab Potential Good   PT Frequency 2x / week   PT Duration 8 weeks  plus eval   PT Treatment/Interventions ADLs/Self Care Home Management;Therapeutic exercise;Therapeutic activities;Functional mobility training;Gait training;Balance training;Neuromuscular re-education;Patient/family education;Orthotic Fit/Training;Electrical Stimulation   PT Next Visit Plan Work on trunk/core stability, continue to progress exercises for lower extremity strengthening; standing balance; continue gait trails with AFO(s); AFO consult   Consulted and Agree with Plan of Care Patient;Family member/caregiver      Patient will benefit from skilled therapeutic intervention in order to improve the following deficits and impairments:  Abnormal gait, Decreased mobility, Decreased range of motion, Decreased strength, Difficulty walking, Impaired flexibility, Postural dysfunction  Visit Diagnosis: Other abnormalities of gait and mobility  Muscle weakness (generalized)     Problem List Patient Active Problem List   Diagnosis Date Noted  . Anxiety state 12/10/2014  . Neurogenic bladder 11/30/2014  . Neurogenic bowel 11/30/2014  . Bacterial UTI 11/30/2014  . Thoracic myelopathy 11/29/2014  . Persistent atrial fibrillation (HCC) 11/27/2014  . Traumatic spinal subdural hematoma (HCC)   . Acute pulmonary embolism (HCC)   . Orthostatic hypotension 11/22/2014  .  Near syncope 11/21/2014  . Paraplegia at T9 level (HCC) 11/17/2014  . Weakness of both legs   . Nocturnal leg cramps 07/16/2014  . Encounter for therapeutic drug monitoring 12/25/2013  . Malaise and fatigue 06/01/2011  . Atrial flutter (HCC) 03/10/2011  . Long term (current) use of anticoagulants 03/10/2011  . Benign hypertensive heart disease without heart failure 03/10/2011  . Hypercholesterolemia 03/10/2011  . Paroxysmal atrial flutter (HCC)   . Palpitations   . PAC (premature atrial contraction)   . PVC's (premature ventricular contractions)   . Malaise   . Fatigue   . Myalgia     Lenoard Helbert W. 04/17/2016, 9:12 AM Gean Maidens., PT [facility] [address] [place], [place], [address] Phone: [phone]   Fax:  [phone]  Name: GLENDON FISER MRN: [id number] Date of Birth: 12-24-1930

## 2016-04-17 NOTE — Telephone Encounter (Signed)
Dr. [name], Jearld Leschvelyn Abend has been seen for several PT visits.  She would benefit from AFO on LLE as well as potential AFO on RLE due to decreased strength, decreased foot clearance, steppage gait pattern.  If you agree, could you please send order for AFO consult for bilateral AFOs for [name] Weber?  Thank you so much.  Lonia BloodAmy Vanice Rappa, PT

## 2016-04-17 NOTE — Telephone Encounter (Signed)
Order placed

## 2016-04-21 ENCOUNTER — Ambulatory Visit: Payer: Medicare Other | Admitting: Physical Therapy

## 2016-04-21 DIAGNOSIS — R2689 Other abnormalities of gait and mobility: Secondary | ICD-10-CM

## 2016-04-21 DIAGNOSIS — R2681 Unsteadiness on feet: Secondary | ICD-10-CM

## 2016-04-21 DIAGNOSIS — M6281 Muscle weakness (generalized): Secondary | ICD-10-CM

## 2016-04-21 NOTE — Therapy (Signed)
[facility] [address] [place], [place], [address] Phone: [phone]   Fax:  [phone]  Physical Therapy Treatment  Patient Details  Name: [name] Weber MRN: [id number] Date of Birth: Jul 10, 1931 Referring Provider: Riley Kill  Encounter Date: 04/21/2016      PT End of Session - 04/21/16 1126    Visit Number 5   Number of Visits 17  per 2x/wk 8 weeks in POC-AWM   Date for PT Re-Evaluation 05/30/16   Authorization Type [employer] Medicare    PT Start Time 0845   PT Stop Time 0930   PT Time Calculation (min) 45 min   Activity Tolerance Patient tolerated treatment well   Behavior During Therapy [facility] for tasks assessed/performed      Past Medical History  Diagnosis Date  . Paroxysmal atrial flutter (HCC)   . Palpitations     OCCASSIONAL  . PAC (premature atrial contraction)     ISOLATED  . PVC's (premature ventricular contractions)     ISOLATED  . Malaise   . Fatigue   . Myalgia     Past Surgical History  Procedure Laterality Date  . Vulvar lesion removal  01/01/2009    She had a 1-cm area of erythema to the right of the urethral meatus  . Thoracic laminectomy for epidural abscess N/A 11/16/2014    Procedure: THORACIC LAMINECTOMY FOR EPIDURAL ABSCESS;  Surgeon: Karn Cassis, MD;  Location: [facility];  Service: Neurosurgery;  Laterality: N/A;    There were no vitals filed for this visit.      Subjective Assessment - 04/21/16 0846    Subjective Using the walker at home instead of the cane.  No falls.  Pt reports no problems increasing to 2 sets of 10 reps of lower extremity exercises.   Patient Stated Goals Pt's goals for therapy are to get out of chair without UE support, feel more secure on feet.   Currently in Pain? No/denies                         [facility] Adult PT Treatment/Exercise - 04/21/16 0854    High Level Balance   High Level Balance Activities Side stepping  3 lengths  of counter with UE support   Knee/Hip Exercises: Stretches   Gastroc Stretch Right;Left;3 reps;30 seconds  Standing at 2" block at counter   Knee/Hip Exercises: Seated   Marching AROM;Strengthening;Right;Left;10 reps;2 sets   Knee/Hip Exercises: Supine   Short Arc Quad Sets Strengthening;Right;Left;10 reps;Other (comment);2 sets  2# bilateral lower extremities      Core stability on therapy ball: -SEated anterior/posterior then lateral weigthshifts 2 sets x 10 reps, with cues for abdominal activation -Seated marching x 10 reps, LAQ x 10 reps with cues for abdominal activation and upright posture -Seated UE alternating lifts, then double UE lifts, then UE trunk rotation x 10 reps each   _All with min guard/min assist on therapy ball for balance and stability.       Balance Exercises - 04/21/16 0910    Balance Exercises: Standing   Standing Eyes Opened Wide (BOA);Head turns;Solid surface;5 reps;Narrow base of support (BOS)  2 sets, head nods, intermittent UE support at chair   Standing Eyes Closed Wide (BOA);Narrow base of support (BOS);Solid surface;2 reps;10 secs  each position, intermittent UE support at chair in front   Tandem Stance Eyes open;Upper extremity support 1;2 reps;30 secs  Semitandem stance 1 rep 10 sec, cues for  posture   SLS Eyes open;Solid surface;Upper extremity support 1;2 reps;10 secs  TActile cues at hip for glut activation   Wall Bumps Hip   Wall Bumps-Hips Eyes opened;10 reps  2 sets   Other Standing Exercises Heel/toe raises for ankle strategy 2 sets x 10 reps.  With tandem stance, PT provides tactile/manual cues for pelvic positioning to prevent excessive anterior pelvic tilt combined with excessive anterior hip lean.           PT Education - 04/21/16 1126    Education provided Yes   Education Details Written copy of HEP additions from last visit   Person(s) Educated Patient   Methods Explanation;Demonstration;Handout   Comprehension Verbalized  understanding;Returned demonstration          PT Short Term Goals - 04/01/16 0748    PT SHORT TERM GOAL #1   Title Pt will be independent with HEP for improved strength, balance, and gait.  TARGET 04/30/16   Time 4   Period Weeks   Status New   PT SHORT TERM GOAL #2   Title Pt will improve TUG score to less than or equal to 17 seconds for decreased fall risk.   Time 4   Period Weeks   Status New   PT SHORT TERM GOAL #3   Title Pt will improve Berg Balance score to at least 27/56 for decreased fall risk.   Time 4   Period Weeks   Status New   PT SHORT TERM GOAL #4   Title Pt will perform at least 6 of 10 reps of sit<>stand transfers with minimal UE support for 18-24" surfaces, for improved lower extremity strength and ease of transfers.   Time 4   Period Weeks   Status New   PT SHORT TERM GOAL #5   Title Formal AFO consult to be initiated to address bilateral lower extremity foot drop.   Time 4   Period Weeks   Status New           PT Long Term Goals - 04/01/16 0754    PT LONG TERM GOAL #1   Title Pt will verbalize understanding of fall prevention within home environment.  TARGET 05/30/16   Time 8   Period Weeks   Status New   PT LONG TERM GOAL #2   Title Pt will improve TUG score to less than or equal to 13.5 seconds for decreased fall risk.   Time 8   Period Weeks   Status New   PT LONG TERM GOAL #3   Title Pt will improve Berg Balance score to at least 37/56 for decreased fall risk.   Time 8   Period Weeks   Status New   PT LONG TERM GOAL #4   Title Pt will improve gait velocity to at least 2.62 ft/sec for improved efficiency and safety with household and community gait.   Time 8   Period Weeks   Status New               Plan - 04/21/16 1127    Clinical Impression Statement Treatment session primarily focused today on core strengthening and trunk stability after brief review of new additions to HEP, as well as standing balance activities.  Pt tends  to have excessive anterior pelvic tilt and forward hip lean, providing stability, which pt needs cues to correct and min assist to hold in position for hip/glut and abdominal activation in standing activities.  PT will continue to benefit from further  skilled PT to address balance and strength and gait.   Rehab Potential Good   PT Frequency 2x / week   PT Duration 8 weeks  plus eval   PT Treatment/Interventions ADLs/Self Care Home Management;Therapeutic exercise;Therapeutic activities;Functional mobility training;Gait training;Balance training;Neuromuscular re-education;Patient/family education;Orthotic Fit/Training;Electrical Stimulation   PT Next Visit Plan Continue to work on trunk/core stability,  standing corner balance balance activities with hip/ankle strategy; continue gait trails with AFO(s); AFO consult   Recommended Other Services AFO consult process initiated with request of order for bilateral AFOs from Dr. Riley Kill (5/19) and request for scheduling AFO consult (5/19)   Consulted and Agree with Plan of Care Patient;Family member/caregiver      Patient will benefit from skilled therapeutic intervention in order to improve the following deficits and impairments:  Abnormal gait, Decreased mobility, Decreased range of motion, Decreased strength, Difficulty walking, Impaired flexibility, Postural dysfunction  Visit Diagnosis: Muscle weakness (generalized)  Other abnormalities of gait and mobility  Unsteadiness on feet     Problem List Patient Active Problem List   Diagnosis Date Noted  . Anxiety state 12/10/2014  . Neurogenic bladder 11/30/2014  . Neurogenic bowel 11/30/2014  . Bacterial UTI 11/30/2014  . Thoracic myelopathy 11/29/2014  . Persistent atrial fibrillation (HCC) 11/27/2014  . Traumatic spinal subdural hematoma (HCC)   . Acute pulmonary embolism (HCC)   . Orthostatic hypotension 11/22/2014  . Near syncope 11/21/2014  . Paraplegia at T9 level (HCC) 11/17/2014  .  Weakness of both legs   . Nocturnal leg cramps 07/16/2014  . Encounter for therapeutic drug monitoring 12/25/2013  . Malaise and fatigue 06/01/2011  . Atrial flutter (HCC) 03/10/2011  . Long term (current) use of anticoagulants 03/10/2011  . Benign hypertensive heart disease without heart failure 03/10/2011  . Hypercholesterolemia 03/10/2011  . Paroxysmal atrial flutter (HCC)   . Palpitations   . PAC (premature atrial contraction)   . PVC's (premature ventricular contractions)   . Malaise   . Fatigue   . Myalgia     Nikoli Nasser W. 04/21/2016, 11:31 AM  Gean Maidens., PT  [facility] [address] [place], [place], [address] Phone: [phone]   Fax:  [phone]  Name: DENIJAH KARRER MRN: [id number] Date of Birth: [birth year]

## 2016-04-21 NOTE — Patient Instructions (Signed)
KNEE: Extension, Short Arc Quads - Supine    Place bolster under knees. Use 2# weight at ankles.  Raise one leg until knee is straight. _10__ reps per set, _2__ sets.  Perform 1-2 times per day.   Copyright  VHI. All rights reserved.    High Stepping in Place (Sitting)    Sitting, alternately lift knees as high as possible. Keep torso erect. (shoulders over hips) Repeat _2 sets of 10___ times, each leg.  Perform 1-2 times per day.  Copyright  VHI. All rights reserved.

## 2016-04-23 ENCOUNTER — Ambulatory Visit: Payer: Medicare Other | Admitting: Physical Therapy

## 2016-04-23 DIAGNOSIS — R2689 Other abnormalities of gait and mobility: Secondary | ICD-10-CM | POA: Diagnosis not present

## 2016-04-23 DIAGNOSIS — R2681 Unsteadiness on feet: Secondary | ICD-10-CM

## 2016-04-23 NOTE — Patient Instructions (Signed)
STAND IN A CORNER, WITH YOUR WALKER OR A CHAIR IN FRONT FOR SUPPORT IF NEEDED.   Feet Apart, Head Motion - Eyes Open    With eyes open, feet apart, move head slowly: up and down 10 times.  Then move head slowly side to side 10 times. Repeat _2___ times per session. Do __1__ sessions per day.  Copyright  VHI. All rights reserved.  Feet Apart, Varied Arm Positions - Eyes Closed    Stand with feet shoulder width apart and arms BY YOUR SIDES. Close eyes and visualize upright position. Hold _10___ seconds. Repeat _2-3___ times per session. Do _1___ sessions per day.  Copyright  VHI. All rights reserved.  Feet Together, Head Motion - Eyes Open    With eyes open, feet together, move head slowly: up and down 10 times.  Then slowly move your head side to side 10 times. Repeat __2__ times per session. Do _1___ sessions per day.  Copyright  VHI. All rights reserved.  Feet Together, Varied Arm Positions - Eyes Closed    Stand with feet together and arms BY YOUR SIDES. Close eyes and visualize upright position. Hold _10___ seconds. Repeat __2-3__ times per session. Do __1__ sessions per day.  Copyright  VHI. All rights reserved.  Weight Shift: Anterior / Posterior (Limits of Stability)    Slowly shift weight backward until toes begin to rise off floor. Return to starting position. Shift weight slowly forward until heels begin to rise off floor. Hold each position ___3_ seconds. Repeat _2 sets of 10___ times per session. Do _1-2__ sessions per day.   Copyright  VHI. All rights reserved.  Weight Shift: Anterior / Posterior (Righting / Equilibrium)    Slowly shift weight forward, hips forward over toes, KEEPING FEET FLAT ON THE FLOOR (USE CHAIR IF NEEDED FOR SUPPORT). Return to starting position. Shift weight backward, and hips back over heels, (HIPS BUMP THE WALL), KEEPING FEET FLAT ON THE FLOOR. Hold each position __3__ seconds. Repeat __2 SETS OF 10_ times per session. Do  __1-2__ sessions per day.  Copyright  VHI. All rights reserved.

## 2016-04-24 NOTE — Therapy (Signed)
[facility] [address] [place], [place], [address] Phone: [phone]   Fax:  [phone]  Physical Therapy Treatment  Patient Details  Name: [name] Weber MRN: [id number] Date of Birth: 05-Jan-1931 Referring Provider: Riley Kill  Encounter Date: 04/23/2016      PT End of Session - 04/24/16 0809    Visit Number 6   Number of Visits 17  per 2x/wk 8 weeks in POC-AWM   Date for PT Re-Evaluation 05/30/16   Authorization Type [employer]HC Medicare    PT Start Time 0848   PT Stop Time 0931   PT Time Calculation (min) 43 min   Activity Tolerance Patient tolerated treatment well   Behavior During Therapy [facility] for tasks assessed/performed      Past Medical History  Diagnosis Date  . Paroxysmal atrial flutter (HCC)   . Palpitations     OCCASSIONAL  . PAC (premature atrial contraction)     ISOLATED  . PVC's (premature ventricular contractions)     ISOLATED  . Malaise   . Fatigue   . Myalgia     Past Surgical History  Procedure Laterality Date  . Vulvar lesion removal  01/01/2009    She had a 1-cm area of erythema to the right of the urethral meatus  . Thoracic laminectomy for epidural abscess N/A 11/16/2014    Procedure: THORACIC LAMINECTOMY FOR EPIDURAL ABSCESS;  Surgeon: Karn Cassis, MD;  Location: [facility];  Service: Neurosurgery;  Laterality: N/A;    There were no vitals filed for this visit.      Subjective Assessment - 04/23/16 0850    Subjective Some days are harder to move than others.   Patient Stated Goals Pt's goals for therapy are to get out of chair without UE support, feel more secure on feet.   Currently in Pain? No/denies                         [facility] Adult PT Treatment/Exercise - 04/24/16 0001    Ambulation/Gait   Ambulation/Gait Yes   Ambulation/Gait Assistance 5: Supervision;4: Min guard   Ambulation/Gait Assistance Details Trial of bilat. toe-off AFOs, cues for  improved heelstrike and step length   Ambulation Distance (Feet) 500 Feet  then 200 ft; short distances, 20-50 ft in gym area   Assistive device Rollator   Gait Pattern Step-through pattern;Decreased stance time - right;Decreased step length - right;Decreased step length - left;Narrow base of support;Right steppage;Left steppage  improved with cues and with AFO   Ambulation Surface Level;Indoor   Gait Comments Spent time at beginning of session on phone with orthotist regarding appointment for orthotic consult.  Order has been received from Dr. Riley Kill, and Thayer Ohm with [facility], will be here at PT appt on 05/05/16.  Upon entering gym from lobby today with cane, pt requires HHA of therapist for steadiness x 80 ft.               Balance Exercises - 04/24/16 0755    Balance Exercises: Standing   Standing Eyes Opened Wide (BOA);Head turns;Solid surface;5 reps;Narrow base of support (BOS)  2 sets, head nods also, intermittent UE support at chair   Standing Eyes Closed Wide (BOA);Narrow base of support (BOS);Solid surface;2 reps;10 secs  each position, intermittent UE support at chair   Tandem Stance Eyes open;Upper extremity support 1;2 reps;30 secs  Semi-tandem stance 3 reps 10 sec, UE support   Wall Bumps Hip   Wall  Bumps-Hips Eyes opened;10 reps  2 sets   Other Standing Exercises Heel/toe raises for ankle strategy 2 sets x 10 reps.  With tandem stance, PT provides tactile/manual cues for pelvic positioning to prevent excessive anterior pelvic tilt combined with excessive anterior hip lean.  Tandem/semi-tandem ex performed wearing bilat AFOS           PT Education - 04/24/16 0809    Education provided Yes   Education Details HEP-corner balance exercises-see instructions   Person(s) Educated Patient   Methods Explanation;Demonstration;Handout   Comprehension Verbalized understanding;Returned demonstration          PT Short Term Goals - 04/01/16 0748    PT SHORT TERM GOAL #1    Title Pt will be independent with HEP for improved strength, balance, and gait.  TARGET 04/30/16   Time 4   Period Weeks   Status New   PT SHORT TERM GOAL #2   Title Pt will improve TUG score to less than or equal to 17 seconds for decreased fall risk.   Time 4   Period Weeks   Status New   PT SHORT TERM GOAL #3   Title Pt will improve Berg Balance score to at least 27/56 for decreased fall risk.   Time 4   Period Weeks   Status New   PT SHORT TERM GOAL #4   Title Pt will perform at least 6 of 10 reps of sit<>stand transfers with minimal UE support for 18-24" surfaces, for improved lower extremity strength and ease of transfers.   Time 4   Period Weeks   Status New   PT SHORT TERM GOAL #5   Title Formal AFO consult to be initiated to address bilateral lower extremity foot drop.   Time 4   Period Weeks   Status New           PT Long Term Goals - 04/01/16 0754    PT LONG TERM GOAL #1   Title Pt will verbalize understanding of fall prevention within home environment.  TARGET 05/30/16   Time 8   Period Weeks   Status New   PT LONG TERM GOAL #2   Title Pt will improve TUG score to less than or equal to 13.5 seconds for decreased fall risk.   Time 8   Period Weeks   Status New   PT LONG TERM GOAL #3   Title Pt will improve Berg Balance score to at least 37/56 for decreased fall risk.   Time 8   Period Weeks   Status New   PT LONG TERM GOAL #4   Title Pt will improve gait velocity to at least 2.62 ft/sec for improved efficiency and safety with household and community gait.   Time 8   Period Weeks   Status New               Plan - 04/24/16 0810    Clinical Impression Statement Pt appears more unsteady today upon arriving at therapy.  Have discussed safety benefits of using rollator at all times, which pt does in home, but she has difficulty getting it in and out of car.  With gait training with bilateral AFOs today, pt able to improve gait pattern, reducing steppage  gait and improving step length and heelstrike using rollator walker.  Pt performs tandem/semi-tandem stance balance activities while wearing AFOs, which provides additional stability.  Pt will continue to benefit from further skilled PT to address balance, strength and gait.  Rehab Potential Good   PT Frequency 2x / week   PT Duration 8 weeks  plus eval   PT Treatment/Interventions ADLs/Self Care Home Management;Therapeutic exercise;Therapeutic activities;Functional mobility training;Gait training;Balance training;Neuromuscular re-education;Patient/family education;Orthotic Fit/Training;Electrical Stimulation   PT Next Visit Plan Check STGs, review corner balance exercises, continue gait trials with AFO; trunk and core stability  Need to have pt add more appts   Recommended Other Services AFO consult scheduled for 05/05/16 with Fayne Mediate and Agree with Plan of Care Patient;Family member/caregiver      Patient will benefit from skilled therapeutic intervention in order to improve the following deficits and impairments:  Abnormal gait, Decreased mobility, Decreased range of motion, Decreased strength, Difficulty walking, Impaired flexibility, Postural dysfunction  Visit Diagnosis: Unsteadiness on feet  Other abnormalities of gait and mobility     Problem List Patient Active Problem List   Diagnosis Date Noted  . Anxiety state 12/10/2014  . Neurogenic bladder 11/30/2014  . Neurogenic bowel 11/30/2014  . Bacterial UTI 11/30/2014  . Thoracic myelopathy 11/29/2014  . Persistent atrial fibrillation (HCC) 11/27/2014  . Traumatic spinal subdural hematoma (HCC)   . Acute pulmonary embolism (HCC)   . Orthostatic hypotension 11/22/2014  . Near syncope 11/21/2014  . Paraplegia at T9 level (HCC) 11/17/2014  . Weakness of both legs   . Nocturnal leg cramps 07/16/2014  . Encounter for therapeutic drug monitoring 12/25/2013  . Malaise and fatigue 06/01/2011  . Atrial flutter (HCC)  03/10/2011  . Long term (current) use of anticoagulants 03/10/2011  . Benign hypertensive heart disease without heart failure 03/10/2011  . Hypercholesterolemia 03/10/2011  . Paroxysmal atrial flutter (HCC)   . Palpitations   . PAC (premature atrial contraction)   . PVC's (premature ventricular contractions)   . Malaise   . Fatigue   . Myalgia     Cinthia Rodden W. 04/24/2016, 8:14 AM  Gean Maidens., PT  [facility] [address] [place], [place], [address] Phone: [phone]   Fax:  [phone]  Name: [name] Weber MRN: [id number] Date of Birth: August 14, 1931

## 2016-04-28 ENCOUNTER — Ambulatory Visit: Payer: Medicare Other | Admitting: Physical Therapy

## 2016-04-28 DIAGNOSIS — R2689 Other abnormalities of gait and mobility: Secondary | ICD-10-CM | POA: Diagnosis not present

## 2016-04-28 DIAGNOSIS — R2681 Unsteadiness on feet: Secondary | ICD-10-CM

## 2016-04-28 DIAGNOSIS — M6281 Muscle weakness (generalized): Secondary | ICD-10-CM

## 2016-04-28 NOTE — Patient Instructions (Signed)
Functional Quadriceps: Sit to Stand    You will want to do this exercise from your guest bed (higher height than your normal chairs) Scoot up towards the edge of chair, tuck your feet under your knees, with your feet flat on floor. Lean forward and boost up from sitting with both arms.  Stand upright, extending knees fully and squeezing through your buttocks to stand tall and steady.  Hold this position for 5 seconds. Repeat _10___ times per set. Do _1-2___ sets per session. Do _2_ sessions per day.  http://orth.exer.us/735   Copyright  VHI. All rights reserved.

## 2016-04-28 NOTE — Therapy (Signed)
[facility] [address] [place], [place], [address] Phone: [phone]   Fax:  [phone]  Physical Therapy Treatment  Patient Details  Name: [name] Weber MRN: [id number] Date of Birth: 11-04-1931 Referring Provider: Naaman Plummer  Encounter Date: 04/28/2016      PT End of Session - 04/28/16 1121    Visit Number 7   Number of Visits 17  per 2x/wk 8 weeks in POC-AWM   Date for PT Re-Evaluation 05/30/16   Authorization Type [employer] Medicare    PT Start Time 0932   PT Stop Time 1012   PT Time Calculation (min) 40 min   Activity Tolerance Patient tolerated treatment well   Behavior During Therapy [facility] for tasks assessed/performed      Past Medical History  Diagnosis Date  . Paroxysmal atrial flutter ([place])   . Palpitations     OCCASSIONAL  . PAC (premature atrial contraction)     ISOLATED  . PVC's (premature ventricular contractions)     ISOLATED  . Malaise   . Fatigue   . Myalgia     Past Surgical History  Procedure Laterality Date  . Vulvar lesion removal  01/01/2009    She had a 1-cm area of erythema to the right of the urethral meatus  . Thoracic laminectomy for epidural abscess N/A 11/16/2014    Procedure: THORACIC LAMINECTOMY FOR EPIDURAL ABSCESS;  Surgeon: Floyce Stakes, MD;  Location: [facility];  Service: Neurosurgery;  Laterality: N/A;    There were no vitals filed for this visit.      Subjective Assessment - 04/28/16 0934    Subjective Had company this weekend; spent all day yesterday washing clothes and cleaning house.   Patient Stated Goals Pt's goals for therapy are to get out of chair without UE support, feel more secure on feet.   Currently in Pain? No/denies                     Neuro Re-education:  Transfers performed as part of NMR for improved safety and stability upon standing.     [facility] Adult PT Treatment/Exercise - 04/28/16 0937    Transfers   Transfers Sit to  Stand;Stand to Sit   Sit to Stand 4: Min guard;Without upper extremity assist;From elevated surface;With upper extremity assist;From chair/3-in-1  26" surface, then 24" surface, then 18" surface   Sit to Stand Details (indicate cue type and reason) Pt requires cues for widened BOS, foot placement and activation of quads/gluts upon standing for optimal posture and decreased feeling of LOB upon standing.   Stand to Sit 4: Min guard;Without upper extremity assist;To elevated surface;With upper extremity assist;To chair/3-in-1   Number of Reps 10 reps;Other sets (comment)  each, from 26", 24", then 18" surfaces   Comments For functional lower extremity strengthening and for proper technique of sit<>stand for improved posture, stability and safety upon standing.   Ambulation/Gait   Ambulation/Gait Yes   Ambulation/Gait Assistance 5: Supervision   Ambulation Distance (Feet) 40 Feet  x 2, 80 ft x 2   Assistive device Straight cane  with tripod base   Gait Pattern Step-through pattern;Decreased stance time - right;Decreased step length - right;Decreased step length - left;Narrow base of support;Right steppage;Left steppage   Ambulation Surface Level;Indoor   Gait Comments Short distance today between areas in gym working on balance.   Standardized Balance Assessment   Standardized Balance Assessment Timed Up and Go Test   Timed Up and  Go Test   TUG Normal TUG   Normal TUG (seconds) 17.99  18.11   Exercises   Exercises Other Exercises   Other Exercises  Standing squats x 10 reps at counter for support, then standing squats on foam surface with UE support, 10 reps each, cues provided for quad and glut activation upon standing.  Postural cues to avoid excessive anterior pelvic positioning      With short distance gait:  Pt needs extra time upon standing prior to initiating gait; cues for upright posture and increased step length/heelstrike with gait.       Balance Exercises - 04/28/16 1005     Balance Exercises: Standing   Standing Eyes Opened Wide (BOA);Head turns;Solid surface;Narrow base of support (BOS);Other reps (comment)  head nods, 10 reps, with intermittent UE support   Standing Eyes Closed Wide (BOA);Narrow base of support (BOS);Solid surface;10 secs;Head turns  head nods x 10 reps with intermittent UE support   SLS Eyes open;Upper extremity support 1  10 reps taps at 6" and 12" steps   Wall Bumps Hip   Wall Bumps-Hips Eyes opened;10 reps   Other Standing Exercises Heel/toe raises x 10 reps in corner with chair support; standing on foam cushion:  marching in place x 10 reps, then forward kicks x 10 reps each, then forward step taps x 10 reps with UE support.             PT Education - 04/28/16 1121    Education provided Yes   Education Details HEP addition-sit<>stand from elevated surfaces at home for functional lower extremity strengthening.   Person(s) Educated Patient   Methods Explanation;Demonstration;Handout   Comprehension Verbalized understanding;Returned demonstration;Verbal cues required          PT Short Term Goals - 04/28/16 0934    PT SHORT TERM GOAL #1   Title Pt will be independent with HEP for improved strength, balance, and gait.  TARGET 04/30/16   Time 4   Period Weeks   Status Achieved   PT SHORT TERM GOAL #2   Title Pt will improve TUG score to less than or equal to 17 seconds for decreased fall risk.   Time 4   Period Weeks   Status On-going   PT SHORT TERM GOAL #3   Title Pt will improve Berg Balance score to at least 27/56 for decreased fall risk.   Time 4   Period Weeks   Status New   PT SHORT TERM GOAL #4   Title Pt will perform at least 6 of 10 reps of sit<>stand transfers with minimal UE support for 18-24" surfaces, for improved lower extremity strength and ease of transfers.   Time 4   Period Weeks   Status Achieved   PT SHORT TERM GOAL #5   Title Formal AFO consult to be initiated to address bilateral lower extremity  foot drop.   Baseline appointment set for early June for [facility]   Time 4   Period Weeks   Status Achieved           PT Long Term Goals - 04/01/16 0754    PT LONG TERM GOAL #1   Title Pt will verbalize understanding of fall prevention within home environment.  TARGET 05/30/16   Time 8   Period Weeks   Status New   PT LONG TERM GOAL #2   Title Pt will improve TUG score to less than or equal to 13.5 seconds for decreased fall risk.   Time 8  Period Weeks   Status New   PT LONG TERM GOAL #3   Title Pt will improve Berg Balance score to at least 37/56 for decreased fall risk.   Time 8   Period Weeks   Status New   PT LONG TERM GOAL #4   Title Pt will improve gait velocity to at least 2.62 ft/sec for improved efficiency and safety with household and community gait.   Time 8   Period Weeks   Status New               Plan - 04/28/16 1122    Clinical Impression Statement Initiated assessment of STGs today, wit pt meeting STG #1, 4, and 5.  STG # 2  not yet met/ongoing, with TUG and Berg scores to be assessed next visit.  Pt is making progress with therapy, but continues to be hesitant with gait due to unsteadiness and lower extremity weakness.  Pt will continue to benefit from further skilled PT to address balance, weakness, and gait and transfer training.   Rehab Potential Good   PT Frequency 2x / week   PT Duration 8 weeks  plus eval   PT Treatment/Interventions ADLs/Self Care Home Management;Therapeutic exercise;Therapeutic activities;Functional mobility training;Gait training;Balance training;Neuromuscular re-education;Patient/family education;Orthotic Fit/Training;Electrical Stimulation   PT Next Visit Plan Continue gait trials with AFO, trunk and core stability; strengthening of lower extremities.  Need to have pt add more appts   Consulted and Agree with Plan of Care Patient;Family member/caregiver      Patient will benefit from skilled therapeutic intervention in  order to improve the following deficits and impairments:  Abnormal gait, Decreased mobility, Decreased range of motion, Decreased strength, Difficulty walking, Impaired flexibility, Postural dysfunction  Visit Diagnosis: Unsteadiness on feet  Muscle weakness (generalized)  Other abnormalities of gait and mobility     Problem List Patient Active Problem List   Diagnosis Date Noted  . Anxiety state 12/10/2014  . Neurogenic bladder 11/30/2014  . Neurogenic bowel 11/30/2014  . Bacterial UTI 11/30/2014  . Thoracic myelopathy 11/29/2014  . Persistent atrial fibrillation ([place]) 11/27/2014  . Traumatic spinal subdural hematoma ([facility])   . Acute pulmonary embolism ([place])   . Orthostatic hypotension 11/22/2014  . Near syncope 11/21/2014  . Paraplegia at T9 level ([place]) 11/17/2014  . Weakness of both legs   . Nocturnal leg cramps 07/16/2014  . Encounter for therapeutic drug monitoring 12/25/2013  . Malaise and fatigue 06/01/2011  . Atrial flutter ([place]) 03/10/2011  . Long term (current) use of anticoagulants 03/10/2011  . Benign hypertensive heart disease without heart failure 03/10/2011  . Hypercholesterolemia 03/10/2011  . Paroxysmal atrial flutter ([place])   . Palpitations   . PAC (premature atrial contraction)   . PVC's (premature ventricular contractions)   . Malaise   . Fatigue   . Myalgia     Milady Fleener W. 04/28/2016, 11:28 AM  Frazier Butt., PT  [facility] [address] [place], [place], [address] Phone: [phone]   Fax:  [phone]  Name: [name] Weber MRN: [id number] Date of Birth: 05/07/1931

## 2016-04-30 ENCOUNTER — Ambulatory Visit: Payer: Medicare Other | Admitting: Physical Therapy

## 2016-05-05 ENCOUNTER — Ambulatory Visit: Payer: Medicare Other | Attending: Physical Medicine & Rehabilitation | Admitting: Physical Therapy

## 2016-05-05 DIAGNOSIS — R2681 Unsteadiness on feet: Secondary | ICD-10-CM

## 2016-05-05 DIAGNOSIS — M6281 Muscle weakness (generalized): Secondary | ICD-10-CM | POA: Diagnosis present

## 2016-05-05 DIAGNOSIS — R2689 Other abnormalities of gait and mobility: Secondary | ICD-10-CM | POA: Diagnosis present

## 2016-05-05 NOTE — Therapy (Signed)
[facility] [address] [place], [place], [address] Phone: [phone]   Fax:  [phone]  Physical Therapy Treatment  Patient Details  Name: DECARLA SIEMEN MRN: [id number] Date of Birth: 1931/05/17 Referring Provider: Riley Kill  Encounter Date: 05/05/2016      PT End of Session - 05/05/16 2251    Visit Number 8   Number of Visits 17   Date for PT Re-Evaluation 05/30/16   Authorization Type [employer] Medicare    PT Start Time 0850   PT Stop Time 0930   PT Time Calculation (min) 40 min   Activity Tolerance Patient tolerated treatment well   Behavior During Therapy [facility] for tasks assessed/performed      Past Medical History  Diagnosis Date  . Paroxysmal atrial flutter ([facility])   . Palpitations     OCCASSIONAL  . PAC (premature atrial contraction)     ISOLATED  . PVC's (premature ventricular contractions)     ISOLATED  . Malaise   . Fatigue   . Myalgia     Past Surgical History  Procedure Laterality Date  . Vulvar lesion removal  01/01/2009    She had a 1-cm area of erythema to the right of the urethral meatus  . Thoracic laminectomy for epidural abscess N/A 11/16/2014    Procedure: THORACIC LAMINECTOMY FOR EPIDURAL ABSCESS;  Surgeon: Karn Cassis, MD;  Location: [facility];  Service: Neurosurgery;  Laterality: N/A;    There were no vitals filed for this visit.      Subjective Assessment - 05/05/16 0859    Subjective Had an upset stomach last week.  Chris with [facility] [employer] present during visit today.   Patient Stated Goals Pt's goals for therapy are to get out of chair without UE support, feel more secure on feet.   Currently in Pain? No/denies                         [facility] Adult PT Treatment/Exercise - 05/05/16 0908    Transfers   Transfers Sit to Stand;Stand to Sit   Sit to Stand 5: Supervision;4: Min guard;With upper extremity assist;From bed  from 20" mat surface   Stand to  Sit 4: Min guard;5: Supervision   Stand to Sit Details 10 reps   Comments For functional lower extremity strengthening and for proper technique of sit<>stand for improved posture, stability and safety upon standing.   Ambulation/Gait   Ambulation/Gait Yes   Ambulation/Gait Assistance 5: Supervision   Ambulation/Gait Assistance Details Trial with bilateral toe-off AFOs   Ambulation Distance (Feet) 450 Feet  then 600   Assistive device Rollator   Gait Pattern Step-through pattern;Decreased stance time - right;Decreased step length - right;Decreased step length - left;Narrow base of support;Right steppage;Left steppage  Less steppage gait with trial of AFOs   Ambulation Surface Level;Indoor   Gait Comments Orthotist present during PT session today to observe gait with trial of bilateral toe-off AFOs.  Orthotist, PT, and pt agree that bilateral toe-off AFOs will be appropriate for patient for improved gait pattern.  Pt does need cues for improved step length and heelstrike.   High Level Balance   High Level Balance Activities Side stepping  3 lengths of counter with UE support   High Level Balance Comments Alternating step taps to 6 inch step 2 sets x 10 reps forward, then 2 sets x 10 reps side direction with bilateral UE support.  Standing with alternating hip  kicks forward x 10, side x 10, back x 10, alternating legs, then marching alternating legs x 10 reps, then hamstring curls x 10 reps each side, all with UE support.          Self Care:  Discussed benefits of bilateral toe-off AFOs, and plans to order/obtain through orthotist.      PT Education - 05/05/16 2250    Education provided Yes   Education Details Agreement with toe-off AFOs bilaterally-orthotist to bring to PT appt in 2 weeks.   Person(s) Educated Patient   Methods Explanation;Demonstration   Comprehension Verbalized understanding;Returned demonstration          PT Short Term Goals - 04/28/16 0934    PT SHORT TERM  GOAL #1   Title Pt will be independent with HEP for improved strength, balance, and gait.  TARGET 04/30/16   Time 4   Period Weeks   Status Achieved   PT SHORT TERM GOAL #2   Title Pt will improve TUG score to less than or equal to 17 seconds for decreased fall risk.   Time 4   Period Weeks   Status On-going   PT SHORT TERM GOAL #3   Title Pt will improve Berg Balance score to at least 27/56 for decreased fall risk.   Time 4   Period Weeks   Status New   PT SHORT TERM GOAL #4   Title Pt will perform at least 6 of 10 reps of sit<>stand transfers with minimal UE support for 18-24" surfaces, for improved lower extremity strength and ease of transfers.   Time 4   Period Weeks   Status Achieved   PT SHORT TERM GOAL #5   Title Formal AFO consult to be initiated to address bilateral lower extremity foot drop.   Baseline appointment set for early June for [facility]   Time 4   Period Weeks   Status Achieved           PT Long Term Goals - 04/01/16 0754    PT LONG TERM GOAL #1   Title Pt will verbalize understanding of fall prevention within home environment.  TARGET 05/30/16   Time 8   Period Weeks   Status New   PT LONG TERM GOAL #2   Title Pt will improve TUG score to less than or equal to 13.5 seconds for decreased fall risk.   Time 8   Period Weeks   Status New   PT LONG TERM GOAL #3   Title Pt will improve Berg Balance score to at least 37/56 for decreased fall risk.   Time 8   Period Weeks   Status New   PT LONG TERM GOAL #4   Title Pt will improve gait velocity to at least 2.62 ft/sec for improved efficiency and safety with household and community gait.   Time 8   Period Weeks   Status New               Plan - 05/05/16 2251    Clinical Impression Statement Orthotist present at PT session today and PT/orthotist/pt agree that pt will benefit from bilateral toe-off AFOs for improved step length, improved heelstrike, improved overall gait pattern.  Pt will continue  to benefit from skilled PT to address blaance, strengthening and gait training.   Rehab Potential Good   PT Frequency 2x / week   PT Duration 8 weeks  plus eval   PT Treatment/Interventions ADLs/Self Care Home Management;Therapeutic exercise;Therapeutic activities;Functional mobility training;Gait training;Balance  training;Neuromuscular re-education;Patient/family education;Orthotic Fit/Training;Electrical Stimulation   PT Next Visit Plan Check remaining LTG (Berg); work on leg press, standing balance on compliant surfaces, trunk/core/hip stability exercises  Need to have pt add more appts   Consulted and Agree with Plan of Care Patient      Patient will benefit from skilled therapeutic intervention in order to improve the following deficits and impairments:  Abnormal gait, Decreased mobility, Decreased range of motion, Decreased strength, Difficulty walking, Impaired flexibility, Postural dysfunction  Visit Diagnosis: Other abnormalities of gait and mobility  Unsteadiness on feet     Problem List Patient Active Problem List   Diagnosis Date Noted  . Anxiety state 12/10/2014  . Neurogenic bladder 11/30/2014  . Neurogenic bowel 11/30/2014  . Bacterial UTI 11/30/2014  . Thoracic myelopathy 11/29/2014  . Persistent atrial fibrillation ([facility]) 11/27/2014  . Traumatic spinal subdural hematoma ([facility])   . Acute pulmonary embolism ([facility])   . Orthostatic hypotension 11/22/2014  . Near syncope 11/21/2014  . Paraplegia at T9 level ([facility]) 11/17/2014  . Weakness of both legs   . Nocturnal leg cramps 07/16/2014  . Encounter for therapeutic drug monitoring 12/25/2013  . Malaise and fatigue 06/01/2011  . Atrial flutter ([facility]) 03/10/2011  . Long term (current) use of anticoagulants 03/10/2011  . Benign hypertensive heart disease without heart failure 03/10/2011  . Hypercholesterolemia 03/10/2011  . Paroxysmal atrial flutter ([facility])   . Palpitations   . PAC (premature atrial contraction)   .  PVC's (premature ventricular contractions)   . Malaise   . Fatigue   . Myalgia     Tammie Ellsworth W. 05/05/2016, 10:56 PM  Gean Maidens., PT  [facility] [address] [place], [place], [address] Phone: [phone]   Fax:  [phone]  Name: LORRIN BODNER MRN: [id number] Date of Birth: [birth year]

## 2016-05-06 ENCOUNTER — Ambulatory Visit: Payer: Medicare Other | Admitting: Physical Therapy

## 2016-05-06 DIAGNOSIS — M6281 Muscle weakness (generalized): Secondary | ICD-10-CM

## 2016-05-06 DIAGNOSIS — R2681 Unsteadiness on feet: Secondary | ICD-10-CM

## 2016-05-06 DIAGNOSIS — R2689 Other abnormalities of gait and mobility: Secondary | ICD-10-CM | POA: Diagnosis not present

## 2016-05-06 NOTE — Therapy (Signed)
[facility] [address] [place], [place], [address] Phone: [phone]   Fax:  [phone]  Physical Therapy Treatment  Patient Details  Name: [name] Weber MRN: [id number] Date of Birth: 07/10/1931 Referring Provider: Riley Kill  Encounter Date: 05/06/2016      PT End of Session - 05/06/16 1135    Visit Number 9   Number of Visits 17   Date for PT Re-Evaluation 05/30/16   Authorization Type [employer] Medicare    PT Start Time 0848   PT Stop Time 0930   PT Time Calculation (min) 42 min   Equipment Utilized During Treatment Gait belt   Activity Tolerance Patient tolerated treatment well   Behavior During Therapy [facility] for tasks assessed/performed      Past Medical History  Diagnosis Date  . Paroxysmal atrial flutter (HCC)   . Palpitations     OCCASSIONAL  . PAC (premature atrial contraction)     ISOLATED  . PVC's (premature ventricular contractions)     ISOLATED  . Malaise   . Fatigue   . Myalgia     Past Surgical History  Procedure Laterality Date  . Vulvar lesion removal  01/01/2009    She had a 1-cm area of erythema to the right of the urethral meatus  . Thoracic laminectomy for epidural abscess N/A 11/16/2014    Procedure: THORACIC LAMINECTOMY FOR EPIDURAL ABSCESS;  Surgeon: Karn Cassis, MD;  Location: [facility];  Service: Neurosurgery;  Laterality: N/A;    There were no vitals filed for this visit.      Subjective Assessment - 05/06/16 0851    Subjective Nothing new since last visit.   Patient Stated Goals Pt's goals for therapy are to get out of chair without UE support, feel more secure on feet.   Currently in Pain? No/denies                Core stability exercises performed sitting on therapy ball:  Anterior/posterior weigthshifting, lateral weightshifting x 10 reps, seated upright posture with abdominal activation with UE alternating lifts, bilateral UE lifts, then trunk  rotation x 10 reps each, then seated marching 2 sets x 10 reps, all with cues for controlled movement patterns and close supervision.         [facility] Adult PT Treatment/Exercise - 05/06/16 0851    Standardized Balance Assessment   Standardized Balance Assessment Berg Balance Test   Berg Balance Test   Sit to Stand Able to stand  independently using hands   Standing Unsupported Able to stand 2 minutes with supervision   Sitting with Back Unsupported but Feet Supported on Floor or Stool Able to sit safely and securely 2 minutes   Stand to Sit Controls descent by using hands   Transfers Able to transfer safely, definite need of hands   Standing Unsupported with Eyes Closed Able to stand 10 seconds with supervision   Standing Ubsupported with Feet Together Needs help to attain position but able to stand for 30 seconds with feet together   From Standing, Reach Forward with Outstretched Arm Can reach forward >12 cm safely (5")   From Standing Position, Pick up Object from Floor Unable to try/needs assist to keep balance   From Standing Position, Turn to Look Behind Over each Shoulder Needs supervision when turning   Turn 360 Degrees Needs close supervision or verbal cueing   Standing Unsupported, Alternately Place Feet on Step/Stool Needs assistance to keep from falling or unable to  try   Standing Unsupported, One Foot in Front Able to take small step independently and hold 30 seconds   Standing on One Leg Unable to try or needs assist to prevent fall   Total Score 27   High Level Balance   High Level Balance Activities Side stepping;Backward walking  Resisted sidestepping with red band 10 ft, 4 reps to R and L   High Level Balance Comments Cues provided for improved trunk rotation with forward/back walking in parallel bars.   Neuro Re-ed    Neuro Re-ed Details  In parallel bars standing on foam:  marching in place x 10 reps, then forward kicks x 10, then forward step taps and side step taps x  10 reps with UE support.  Mini-squats on foam, with cues for quad activation in upright standing.  Alternating forward taps to 4" block with 1UE support, then forward step ups x 10 reps each, with UE support, for improved quad activation and functional strength for SLS.   Exercises   Exercises Other Exercises   Other Exercises  At parallel bars, mini-squats x 10 reps, 2 sets, with cues for quad activation for terminal knee extension in standing.   Knee/Hip Exercises: Machines for Strengthening   Cybex Leg Press bilateral lower extremities x 10 reps 45#   Knee/Hip Exercises: Supine   Bridges Strengthening;Both;10 reps   Bridges with Clamshell Strengthening;Both;1 set;10 reps   Other Supine Knee/Hip Exercises Bridging with marching:  10 reps.  Cues for abdominal activation and control.                PT Education - 05/05/16 2250    Education provided Yes   Education Details Agreement with toe-off AFOs bilaterally-orthotist to bring to PT appt in 2 weeks.   Person(s) Educated Patient   Methods Explanation;Demonstration   Comprehension Verbalized understanding;Returned demonstration          PT Short Term Goals - 05/06/16 1136    PT SHORT TERM GOAL #1   Title Pt will be independent with HEP for improved strength, balance, and gait.  TARGET 04/30/16   Time 4   Period Weeks   Status Achieved   PT SHORT TERM GOAL #2   Title Pt will improve TUG score to less than or equal to 17 seconds for decreased fall risk.   Time 4   Period Weeks   Status On-going   PT SHORT TERM GOAL #3   Title Pt will improve Berg Balance score to at least 27/56 for decreased fall risk.   Time 4   Period Weeks   Status Achieved   PT SHORT TERM GOAL #4   Title Pt will perform at least 6 of 10 reps of sit<>stand transfers with minimal UE support for 18-24" surfaces, for improved lower extremity strength and ease of transfers.   Time 4   Period Weeks   Status Achieved   PT SHORT TERM GOAL #5   Title  Formal AFO consult to be initiated to address bilateral lower extremity foot drop.   Baseline appointment set for early June for [facility]   Time 4   Period Weeks   Status Achieved           PT Long Term Goals - 04/01/16 0754    PT LONG TERM GOAL #1   Title Pt will verbalize understanding of fall prevention within home environment.  TARGET 05/30/16   Time 8   Period Weeks   Status New   PT LONG  TERM GOAL #2   Title Pt will improve TUG score to less than or equal to 13.5 seconds for decreased fall risk.   Time 8   Period Weeks   Status New   PT LONG TERM GOAL #3   Title Pt will improve Berg Balance score to at least 37/56 for decreased fall risk.   Time 8   Period Weeks   Status New   PT LONG TERM GOAL #4   Title Pt will improve gait velocity to at least 2.62 ft/sec for improved efficiency and safety with household and community gait.   Time 8   Period Weeks   Status New               Plan - 05/06/16 1136    Clinical Impression Statement Assessed Berg Balance score today, with pt improving from 22/56 to 27/56 since eval.  Pt continues to need UE support for dynamic balance activities, but is improving in tolerance and progression of activities.  Continues to need cues for posture and abdominal activation/core stability.  Pt will continue to benefit from further skilled PT to address strength, blance, gait activities.   Rehab Potential Good   PT Frequency 2x / week   PT Duration 8 weeks  plus eval   PT Treatment/Interventions ADLs/Self Care Home Management;Therapeutic exercise;Therapeutic activities;Functional mobility training;Gait training;Balance training;Neuromuscular re-education;Patient/family education;Orthotic Fit/Training;Electrical Stimulation   PT Next Visit Plan work on leg press, standing balance on compliant surfaces, trunk/core/hip stability exercises.  GCODE NEXT VISIT  Need to have pt add more appts   Consulted and Agree with Plan of Care Patient       Patient will benefit from skilled therapeutic intervention in order to improve the following deficits and impairments:  Abnormal gait, Decreased mobility, Decreased range of motion, Decreased strength, Difficulty walking, Impaired flexibility, Postural dysfunction  Visit Diagnosis: Muscle weakness (generalized)  Unsteadiness on feet     Problem List Patient Active Problem List   Diagnosis Date Noted  . Anxiety state 12/10/2014  . Neurogenic bladder 11/30/2014  . Neurogenic bowel 11/30/2014  . Bacterial UTI 11/30/2014  . Thoracic myelopathy 11/29/2014  . Persistent atrial fibrillation (HCC) 11/27/2014  . Traumatic spinal subdural hematoma (HCC)   . Acute pulmonary embolism (HCC)   . Orthostatic hypotension 11/22/2014  . Near syncope 11/21/2014  . Paraplegia at T9 level (HCC) 11/17/2014  . Weakness of both legs   . Nocturnal leg cramps 07/16/2014  . Encounter for therapeutic drug monitoring 12/25/2013  . Malaise and fatigue 06/01/2011  . Atrial flutter (HCC) 03/10/2011  . Long term (current) use of anticoagulants 03/10/2011  . Benign hypertensive heart disease without heart failure 03/10/2011  . Hypercholesterolemia 03/10/2011  . Paroxysmal atrial flutter (HCC)   . Palpitations   . PAC (premature atrial contraction)   . PVC's (premature ventricular contractions)   . Malaise   . Fatigue   . Myalgia     MARRIOTT,AMY W. 05/06/2016, 11:39 AM  Gean MaidensMARRIOTT,AMY W., PT  [facility] [address] [place], [place], [address] Phone: [phone]   Fax:  [phone]  Name: [name] Weber MRN: [id number] Date of Birth: 07/16/1931

## 2016-05-07 ENCOUNTER — Ambulatory Visit: Payer: Medicare Other | Admitting: Physical Therapy

## 2016-05-12 ENCOUNTER — Ambulatory Visit: Payer: Medicare Other | Admitting: Physical Therapy

## 2016-05-12 DIAGNOSIS — R2689 Other abnormalities of gait and mobility: Secondary | ICD-10-CM

## 2016-05-12 DIAGNOSIS — M6281 Muscle weakness (generalized): Secondary | ICD-10-CM

## 2016-05-12 DIAGNOSIS — R2681 Unsteadiness on feet: Secondary | ICD-10-CM

## 2016-05-12 NOTE — Therapy (Signed)
[facility] [address] [place], [place], [address] Phone: [phone]   Fax:  [phone]  Physical Therapy Treatment  Patient Details  Name: [name] Weber MRN: [id number] Date of Birth: 02-06-1931 Referring Provider: Naaman Plummer  Encounter Date: 05/12/2016      PT End of Session - 05/12/16 1048    Visit Number 10   Number of Visits 17   Date for PT Re-Evaluation 05/30/16   Authorization Type [employer] Medicare    PT Start Time 0940   PT Stop Time 1024   PT Time Calculation (min) 44 min   Equipment Utilized During Treatment Gait belt   Activity Tolerance Patient tolerated treatment well   Behavior During Therapy [facility] for tasks assessed/performed      Past Medical History  Diagnosis Date  . Paroxysmal atrial flutter ([place])   . Palpitations     OCCASSIONAL  . PAC (premature atrial contraction)     ISOLATED  . PVC's (premature ventricular contractions)     ISOLATED  . Malaise   . Fatigue   . Myalgia     Past Surgical History  Procedure Laterality Date  . Vulvar lesion removal  01/01/2009    She had a 1-cm area of erythema to the right of the urethral meatus  . Thoracic laminectomy for epidural abscess N/A 11/16/2014    Procedure: THORACIC LAMINECTOMY FOR EPIDURAL ABSCESS;  Surgeon: Floyce Stakes, MD;  Location: [place] [facility];  Service: Neurosurgery;  Laterality: N/A;    There were no vitals filed for this visit.                       [facility] Adult PT Treatment/Exercise - 05/12/16 1036    Transfers   Transfers Sit to Stand;Stand to Sit   Sit to Stand 5: Supervision;4: Min guard;With upper extremity assist;From bed;From chair/3-in-1  from elevated mat surface, then chair   Sit to Stand Details (indicate cue type and reason) Pt requires cues for foot placement and for equal push through UE's for equal weightshift; cue for quad activation in standing   Stand to Sit 5: Supervision   Number of  Reps 10 reps;Other sets (comment)  from mat, then from chair   Comments For functional lower extremity strengthening and for proper technique of sit<>stand for improved posture, stability and safety upon standing.   Ambulation/Gait   Ambulation/Gait Yes   Ambulation/Gait Assistance 5: Supervision   Ambulation/Gait Assistance Details Gait with bilateral toe-off AFOs   Ambulation Distance (Feet) 30 Feet  80, then 200 ft to car at end of session with rollator   Assistive device Rolling walker  tripod cane into therapy with HHA x 80 ft   Gait Pattern Step-through pattern;Decreased stance time - right;Decreased step length - right;Decreased step length - left;Narrow base of support;Right steppage;Left steppage  Less steppage with AFOs and rollator   Ambulation Surface Level;Indoor;Outdoor   Gait Comments Discussed safety with gait with rollator versus cane, especially on outdoor surfaces, due to pt needing HHA upon standing and coming into therapy session.     Timed Up and Go Test   Normal TUG (seconds) 20  rollator; 23.92 with tripod cane   High Level Balance   High Level Balance Activities Side stepping  3 reps along counter   High Level Balance Comments Alternating step taps to 6" step, then 12" step x 12 reps each with UE support; forward step ups x 12 reps with 3 second  hold, with UE support for improved SLS   Knee/Hip Exercises: Supine   Bridges Strengthening;Both;10 reps   Bridges with Clamshell Strengthening;Both;1 set;10 reps   Other Supine Knee/Hip Exercises Bridging with marching:  10 reps.  Cues for abdominal activation and control.      Core stability exercises performed sitting on therapy ball: Anterior/posterior weigthshifting, lateral weightshifting x 10 reps, seated upright posture with abdominal activation with UE alternating lifts, bilateral UE lifts, then trunk rotation x 10 reps each, then seated marching, LAQs x 10 reps, all with cues for controlled movement patterns and  close supervision        Balance Exercises - 05/12/16 1043    Balance Exercises: Standing   Standing Eyes Opened Wide (BOA);Head turns;Solid surface;Narrow base of support (BOS);Other reps (comment)  Head nods 10 reps with intermittent UE support   Standing Eyes Closed Wide (BOA);Narrow base of support (BOS);Solid surface;10 secs;Head turns  head nods 10 reps intermittent UE support   Tandem Stance Eyes open;Intermittent upper extremity support;2 reps;10 secs  semi-tandem stance in corner with rollator in front   Wall Bumps Hip   Wall Bumps-Hips Eyes opened;10 reps             PT Short Term Goals - 05/06/16 1136    PT SHORT TERM GOAL #1   Title Pt will be independent with HEP for improved strength, balance, and gait.  TARGET 04/30/16   Time 4   Period Weeks   Status Achieved   PT SHORT TERM GOAL #2   Title Pt will improve TUG score to less than or equal to 17 seconds for decreased fall risk.   Time 4   Period Weeks   Status On-going   PT SHORT TERM GOAL #3   Title Pt will improve Berg Balance score to at least 27/56 for decreased fall risk.   Time 4   Period Weeks   Status Achieved   PT SHORT TERM GOAL #4   Title Pt will perform at least 6 of 10 reps of sit<>stand transfers with minimal UE support for 18-24" surfaces, for improved lower extremity strength and ease of transfers.   Time 4   Period Weeks   Status Achieved   PT SHORT TERM GOAL #5   Title Formal AFO consult to be initiated to address bilateral lower extremity foot drop.   Baseline appointment set for early June for [facility]   Time 4   Period Weeks   Status Achieved           PT Long Term Goals - 04/01/16 0754    PT LONG TERM GOAL #1   Title Pt will verbalize understanding of fall prevention within home environment.  TARGET 05/30/16   Time 8   Period Weeks   Status New   PT LONG TERM GOAL #2   Title Pt will improve TUG score to less than or equal to 13.5 seconds for decreased fall risk.   Time 8    Period Weeks   Status New   PT LONG TERM GOAL #3   Title Pt will improve Berg Balance score to at least 37/56 for decreased fall risk.   Time 8   Period Weeks   Status New   PT LONG TERM GOAL #4   Title Pt will improve gait velocity to at least 2.62 ft/sec for improved efficiency and safety with household and community gait.   Time 8   Period Weeks   Status New  Plan - 05/15/2016 1048    Clinical Impression Statement Pt continues to need UE support for dynamic balance activities and gait is improved with use of trial of bilateral AFOs and rollator walker.  Had discussion with patient today about need to use rollator at all times, not just at home,f or improved stability and safety with gait.  Pt will continue to benefit from further skilled PT to address strength, balance and gait.   Rehab Potential Good   PT Frequency 2x / week   PT Duration 8 weeks  plus eval   PT Treatment/Interventions ADLs/Self Care Home Management;Therapeutic exercise;Therapeutic activities;Functional mobility training;Gait training;Balance training;Neuromuscular re-education;Patient/family education;Orthotic Fit/Training;Electrical Stimulation   PT Next Visit Plan work on leg press, standing balance on compliant surfaces, hip/quad/hamstring strengthening-possible updates to HEP  Need to have pt add more appts   Consulted and Agree with Plan of Care Patient      Patient will benefit from skilled therapeutic intervention in order to improve the following deficits and impairments:  Abnormal gait, Decreased mobility, Decreased range of motion, Decreased strength, Difficulty walking, Impaired flexibility, Postural dysfunction  Visit Diagnosis: Other abnormalities of gait and mobility  Unsteadiness on feet  Muscle weakness (generalized)       G-Codes - 05/15/2016 1052    Functional Assessment Tool Used Merrilee Jansky 27/56, TUG score 20 seconds with rollator (17.99 sec at best)   Functional  Limitation Mobility: Walking and moving around   Mobility: Walking and Moving Around Current Status [phone]) At least 40 percent but less than 60 percent impaired, limited or restricted   Mobility: Walking and Moving Around Goal Status [phone]) At least 20 percent but less than 40 percent impaired, limited or restricted      Problem List Patient Active Problem List   Diagnosis Date Noted  . Anxiety state 12/10/2014  . Neurogenic bladder 11/30/2014  . Neurogenic bowel 11/30/2014  . Bacterial UTI 11/30/2014  . Thoracic myelopathy 11/29/2014  . Persistent atrial fibrillation ([place]) 11/27/2014  . Traumatic spinal subdural hematoma (HCC)   . Acute pulmonary embolism ([place])   . Orthostatic hypotension 11/22/2014  . Near syncope 11/21/2014  . Paraplegia at T9 level ([place]) 11/17/2014  . Weakness of both legs   . Nocturnal leg cramps 07/16/2014  . Encounter for therapeutic drug monitoring 12/25/2013  . Malaise and fatigue 06/01/2011  . Atrial flutter ([place]) 03/10/2011  . Long term (current) use of anticoagulants 03/10/2011  . Benign hypertensive heart disease without heart failure 03/10/2011  . Hypercholesterolemia 03/10/2011  . Paroxysmal atrial flutter ([place])   . Palpitations   . PAC (premature atrial contraction)   . PVC's (premature ventricular contractions)   . Malaise   . Fatigue   . Myalgia     Bonney Berres W. 15-May-2016, 10:53 AM  Frazier Butt., PT  [facility] [address] [place], [place], [address] Phone: [phone]   Fax:  [phone]  Name: [name] Weber MRN: [id number] Date of Birth: Mar 20, 1931  Physical Therapy Progress Note  Dates of Reporting Period: 03/31/16 to 05-15-2016  Objective Reports of Subjective Statement: No reports of falls at home.  Uses rollator at home, tripod cane in community.  Objective Measurements: Berg Balance 27/56, TUG 20 seconds with rollator, 23.29 sec with tripod  cane  Goal Update: Pt has met 4 of 5 short term goals and is progression towards LTGs.     PT Short Term Goals - 05/06/16 1136    PT SHORT TERM GOAL #1  Title Pt will be independent with HEP for improved strength, balance, and gait.  TARGET 04/30/16   Time 4   Period Weeks   Status Achieved   PT SHORT TERM GOAL #2   Title Pt will improve TUG score to less than or equal to 17 seconds for decreased fall risk.   Time 4   Period Weeks   Status On-going   PT SHORT TERM GOAL #3   Title Pt will improve Berg Balance score to at least 27/56 for decreased fall risk.   Time 4   Period Weeks   Status Achieved   PT SHORT TERM GOAL #4   Title Pt will perform at least 6 of 10 reps of sit<>stand transfers with minimal UE support for 18-24" surfaces, for improved lower extremity strength and ease of transfers.   Time 4   Period Weeks   Status Achieved   PT SHORT TERM GOAL #5   Title Formal AFO consult to be initiated to address bilateral lower extremity foot drop.   Baseline appointment set for early June for [facility]   Time 4   Period Weeks   Status Achieved      Plan: Continue strengthening, balance, gait training for improved functional mobility.  Reason Skilled Services are Required: Pt is slowly progressing with balance, strength, gait.  Pt continues to be at fall risk.  AFOS have been assessed by orthotist and pt to receive bilateral toe-off AFOs for improved stability with gait.  Pt will continue to benefit from further skilled PT to address strength, balance, gait training for improved functional mobility and decreased fall risk.  Mady Haagensen, PT 05/12/2016 10:58 AM Phone: [phone] Fax: [phone]

## 2016-05-14 ENCOUNTER — Ambulatory Visit: Payer: Medicare Other | Admitting: Physical Therapy

## 2016-05-14 DIAGNOSIS — R2689 Other abnormalities of gait and mobility: Secondary | ICD-10-CM | POA: Diagnosis not present

## 2016-05-14 DIAGNOSIS — R2681 Unsteadiness on feet: Secondary | ICD-10-CM

## 2016-05-14 DIAGNOSIS — M6281 Muscle weakness (generalized): Secondary | ICD-10-CM

## 2016-05-14 NOTE — Therapy (Signed)
[facility] [address] [place], [place], [address] Phone: [phone]   Fax:  [phone]  Physical Therapy Treatment  Patient Details  Name: [name] Weber MRN: [id number] Date of Birth: [birth year] Referring Provider: Riley Kill  Encounter Date: 05/14/2016      PT End of Session - 05/14/16 1031    Visit Number 11   Number of Visits 17   Date for PT Re-Evaluation 05/30/16   Authorization Type U[employer] Medicare    PT Start Time 0852   PT Stop Time 0932   PT Time Calculation (min) 40 min   Equipment Utilized During Treatment Gait belt   Activity Tolerance Patient tolerated treatment well   Behavior During Therapy [facility] for tasks assessed/performed      Past Medical History  Diagnosis Date  . Paroxysmal atrial flutter (HCC)   . Palpitations     OCCASSIONAL  . PAC (premature atrial contraction)     ISOLATED  . PVC's (premature ventricular contractions)     ISOLATED  . Malaise   . Fatigue   . Myalgia     Past Surgical History  Procedure Laterality Date  . Vulvar lesion removal  01/01/2009    She had a 1-cm area of erythema to the right of the urethral meatus  . Thoracic laminectomy for epidural abscess N/A 11/16/2014    Procedure: THORACIC LAMINECTOMY FOR EPIDURAL ABSCESS;  Surgeon: Karn Cassis, MD;  Location: [facility];  Service: Neurosurgery;  Laterality: N/A;    There were no vitals filed for this visit.      Subjective Assessment - 05/14/16 0854    Subjective Nothing new.  [facility] called, not sure if braces are ready.   Patient Stated Goals Pt's goals for therapy are to get out of chair without UE support, feel more secure on feet.   Currently in Pain? No/denies                         [facility] Adult PT Treatment/Exercise - 05/14/16 0901    Knee/Hip Exercises: Stretches   Active Hamstring Stretch Right;Left;30 seconds;3 reps  Seated edge of mat   Knee/Hip Exercises:  Machines for Strengthening   Cybex Leg Press bilateral lower extremities 2 sets x 10 reps 50#   Knee/Hip Exercises: Standing   Knee Flexion Strengthening;Right;Left;15 reps  2#   Hip Flexion Stengthening;Right;Left;15 reps  2#   Hip Abduction Stengthening;Right;Left;1 set;15 reps  2#   Other Standing Knee Exercises Standing hamstring curls 15 reps 2# weight each leg   Knee/Hip Exercises: Seated   Long Arc Quad Strengthening;Right;Left;1 set;10 reps;Weights   Long Arc Quad Weight 2 lbs.   Marching Strengthening;Right;Left;1 set;20 reps;Weights   Marching Limitations 2   Knee/Hip Exercises: Prone   Hamstring Curl 1 set;10 reps   Hip Extension AROM;Strengthening;Right;Left;1 set;10 reps        Neuro Re-education: Standing on compliant surfaces in parallel bars: -On foam balance beam:  Marching in place, forward kicks, forward step taps x 15 reps each with bilateral UE support; EO with head turns x 10, with head nods x 10 -On rockerboard-ankle/hip strategy work x 10 reps each, with pt tending to use hips>ankles in anterior/posterior direction; standing balanced on rockerboard with intermittent UE support-alternating arm swing, bilateral UE lifts, trunk rotation, then EO with head turns x 10 and head nods x 10 with therapist min guard/supervision.  Pt tends to have increased anterior lean during head turn/head nod  activities.  Alternating step taps forward x 15 reps to 6 " and 12 " steps, then side step taps x 15 reps with 1-2 UE support, for improved single limb stance.           PT Short Term Goals - 05/06/16 1136    PT SHORT TERM GOAL #1   Title Pt will be independent with HEP for improved strength, balance, and gait.  TARGET 04/30/16   Time 4   Period Weeks   Status Achieved   PT SHORT TERM GOAL #2   Title Pt will improve TUG score to less than or equal to 17 seconds for decreased fall risk.   Time 4   Period Weeks   Status On-going   PT SHORT TERM GOAL #3   Title Pt will  improve Berg Balance score to at least 27/56 for decreased fall risk.   Time 4   Period Weeks   Status Achieved   PT SHORT TERM GOAL #4   Title Pt will perform at least 6 of 10 reps of sit<>stand transfers with minimal UE support for 18-24" surfaces, for improved lower extremity strength and ease of transfers.   Time 4   Period Weeks   Status Achieved   PT SHORT TERM GOAL #5   Title Formal AFO consult to be initiated to address bilateral lower extremity foot drop.   Baseline appointment set for early June for [facility]   Time 4   Period Weeks   Status Achieved           PT Long Term Goals - 04/01/16 0754    PT LONG TERM GOAL #1   Title Pt will verbalize understanding of fall prevention within home environment.  TARGET 05/30/16   Time 8   Period Weeks   Status New   PT LONG TERM GOAL #2   Title Pt will improve TUG score to less than or equal to 13.5 seconds for decreased fall risk.   Time 8   Period Weeks   Status New   PT LONG TERM GOAL #3   Title Pt will improve Berg Balance score to at least 37/56 for decreased fall risk.   Time 8   Period Weeks   Status New   PT LONG TERM GOAL #4   Title Pt will improve gait velocity to at least 2.62 ft/sec for improved efficiency and safety with household and community gait.   Time 8   Period Weeks   Status New               Plan - 05/14/16 1032    Clinical Impression Statement Pt tolerates additions of weights during standing exercises well, with cues for avoiding compensations with posture.  Pt continues to be motivated for therapy sessions and is working consistently on HEP at home.   Rehab Potential Good   PT Frequency 2x / week   PT Duration 8 weeks  plus eval   PT Treatment/Interventions ADLs/Self Care Home Management;Therapeutic exercise;Therapeutic activities;Functional mobility training;Gait training;Balance training;Neuromuscular re-education;Patient/family education;Orthotic Fit/Training;Electrical Stimulation   PT  Next Visit Plan work on leg press, standing balance on compliant surfaces, hip/quad/hamstring strengthening-possible updates to HEP; gait training activities with AFOs  Need to have pt add more appts   Consulted and Agree with Plan of Care Patient      Patient will benefit from skilled therapeutic intervention in order to improve the following deficits and impairments:  Abnormal gait, Decreased mobility, Decreased range of motion, Decreased strength,  Difficulty walking, Impaired flexibility, Postural dysfunction  Visit Diagnosis: Muscle weakness (generalized)  Unsteadiness on feet     Problem List Patient Active Problem List   Diagnosis Date Noted  . Anxiety state 12/10/2014  . Neurogenic bladder 11/30/2014  . Neurogenic bowel 11/30/2014  . Bacterial UTI 11/30/2014  . Thoracic myelopathy 11/29/2014  . Persistent atrial fibrillation (HCC) 11/27/2014  . Traumatic spinal subdural hematoma (HCC)   . Acute pulmonary embolism (HCC)   . Orthostatic hypotension 11/22/2014  . Near syncope 11/21/2014  . Paraplegia at T9 level (HCC) 11/17/2014  . Weakness of both legs   . Nocturnal leg cramps 07/16/2014  . Encounter for therapeutic drug monitoring 12/25/2013  . Malaise and fatigue 06/01/2011  . Atrial flutter (HCC) 03/10/2011  . Long term (current) use of anticoagulants 03/10/2011  . Benign hypertensive heart disease without heart failure 03/10/2011  . Hypercholesterolemia 03/10/2011  . Paroxysmal atrial flutter (HCC)   . Palpitations   . PAC (premature atrial contraction)   . PVC's (premature ventricular contractions)   . Malaise   . Fatigue   . Myalgia     Romelle Reiley W. 05/14/2016, 10:36 AM  Gean MaidensMARRIOTT,Ayona Yniguez W., PT  [facility] [address] [place], [place], [address] Phone: [phone]   Fax:  [phone]  Name: [name] Weber MRN: [id number] Date of Birth: 01/30/1931

## 2016-05-15 ENCOUNTER — Telehealth: Payer: Self-pay | Admitting: Physical Medicine & Rehabilitation

## 2016-05-15 NOTE — Telephone Encounter (Signed)
[facility] over a form of medical necessity to our office for this patient.  Patient has an appointment this coming Monday and they need it faxed to them.  Please fax it to [phone], of if you have questions please call at [phone].

## 2016-05-19 ENCOUNTER — Ambulatory Visit: Payer: Medicare Other | Admitting: Physical Therapy

## 2016-05-19 DIAGNOSIS — R2689 Other abnormalities of gait and mobility: Secondary | ICD-10-CM

## 2016-05-19 DIAGNOSIS — R2681 Unsteadiness on feet: Secondary | ICD-10-CM

## 2016-05-20 NOTE — Therapy (Signed)
[facility] [address] [place], [place], [address] Phone: [phone]   Fax:  [phone]  Physical Therapy Treatment  Patient Details  Name: LANYLA COSTELLO MRN: [id number] Date of Birth: [birth year] Referring Provider: Riley Kill  Encounter Date: 05/19/2016      PT End of Session - 05/20/16 0803    Visit Number 12   Number of Visits 17   Date for PT Re-Evaluation 05/30/16   Authorization Type [employer]HC Medicare    PT Start Time 0930   PT Stop Time 1017   PT Time Calculation (min) 47 min   Equipment Utilized During Treatment Gait belt   Activity Tolerance Patient tolerated treatment well   Behavior During Therapy [facility] for tasks assessed/performed      Past Medical History  Diagnosis Date  . Paroxysmal atrial flutter (HCC)   . Palpitations     OCCASSIONAL  . PAC (premature atrial contraction)     ISOLATED  . PVC's (premature ventricular contractions)     ISOLATED  . Malaise   . Fatigue   . Myalgia     Past Surgical History  Procedure Laterality Date  . Vulvar lesion removal  01/01/2009    She had a 1-cm area of erythema to the right of the urethral meatus  . Thoracic laminectomy for epidural abscess N/A 11/16/2014    Procedure: THORACIC LAMINECTOMY FOR EPIDURAL ABSCESS;  Surgeon: Karn Cassis, MD;  Location: [facility];  Service: Neurosurgery;  Laterality: N/A;    There were no vitals filed for this visit.      Subjective Assessment - 05/19/16 0933    Subjective Nothing new.  Quiet weekend.   Patient Stated Goals Pt's goals for therapy are to get out of chair without UE support, feel more secure on feet.   Currently in Pain? No/denies                         [facility] Adult PT Treatment/Exercise - 05/19/16 0942    Ambulation/Gait   Ambulation/Gait Yes   Ambulation/Gait Assistance 5: Supervision;4: Min guard;4: Min assist  min A >min guard with use of cane   Ambulation/Gait  Assistance Details Bilateral AFOs (toe-off) delivered today, with PT/orthotist providing discussion regarding proper wear/care and donning/doffing of AFO.   Ambulation Distance (Feet) 60 Feet  tripod cane coming into therapy   Assistive device Rollator  cane with tripod base   Gait Pattern Step-through pattern;Decreased stance time - right;Decreased step length - right;Decreased step length - left;Narrow base of support;Right steppage;Left steppage  Less steppage with AFOs and rollator with gait   Ambulation Surface Level;Indoor   Gait Comments 120 ft with bilateral AFOs with tripod cane with min guard assistance, then 120 ft with rollator walker with bilateral AFOs with supervision.  Pt begins with steppage gait pattern while wearing bilateral AFOs with cane, but with cues, pt is able to slow gait pattern and improve heelstrike and step length.   Posture/Postural Control   Posture/Postural Control Postural limitations   Posture Comments With use of cane, pt stands with increased anterior lean, with standing more on metatarsals.  1/4" foam heel wedge added to bilateral shoes.   Knee/Hip Exercises: Machines for Strengthening   Cybex Leg Press bilateral lower extremities 2 sets x 10 reps 55#  cues for slow, controlled motion    Pt demonstrating improved control and ability for increased weight on leg press this visit.   Gait/Orthotic training (continued):  Orthotist present to assist with initial fit of bilateral toe-off AFOs.  Heel wedge added to bilateral shoes in addition to toe-off AFOs, with gait with cane x 240 ft with min assist/min guard.  Pt requires min assist with quick stops, due to increased anterior lean/pitch, with pt widening BOS and needing therapist assist to regain balance.  During session, pt progressively able to lessen steppage gait with cane and bilateral AFOs, however, discussed with pt the safety issues with use of cane versus rollator, as rollator improves gait pattern and  safety with gait.  Pt concerned about not being able to transport rollator walker.  Took out heel wedges at end of session to allow further training next visit.  Pt was assisted out to her vehicle by PT tech (no charge).            PT Education - 05/20/16 0802    Education provided Yes   Education Details AFO wear and care.  Discussed use of heel wedges to help with heel contact; will await further training with those until next PT session.  Discussed again need for rollator for increased safety with gait or assistance when using cane.   Person(s) Educated Patient   Methods Explanation;Demonstration   Comprehension Verbalized understanding;Need further instruction;Returned demonstration          PT Short Term Goals - 05/06/16 1136    PT SHORT TERM GOAL #1   Title Pt will be independent with HEP for improved strength, balance, and gait.  TARGET 04/30/16   Time 4   Period Weeks   Status Achieved   PT SHORT TERM GOAL #2   Title Pt will improve TUG score to less than or equal to 17 seconds for decreased fall risk.   Time 4   Period Weeks   Status On-going   PT SHORT TERM GOAL #3   Title Pt will improve Berg Balance score to at least 27/56 for decreased fall risk.   Time 4   Period Weeks   Status Achieved   PT SHORT TERM GOAL #4   Title Pt will perform at least 6 of 10 reps of sit<>stand transfers with minimal UE support for 18-24" surfaces, for improved lower extremity strength and ease of transfers.   Time 4   Period Weeks   Status Achieved   PT SHORT TERM GOAL #5   Title Formal AFO consult to be initiated to address bilateral lower extremity foot drop.   Baseline appointment set for early June for [facility]   Time 4   Period Weeks   Status Achieved           PT Long Term Goals - 04/01/16 0754    PT LONG TERM GOAL #1   Title Pt will verbalize understanding of fall prevention within home environment.  TARGET 05/30/16   Time 8   Period Weeks   Status New   PT LONG  TERM GOAL #2   Title Pt will improve TUG score to less than or equal to 13.5 seconds for decreased fall risk.   Time 8   Period Weeks   Status New   PT LONG TERM GOAL #3   Title Pt will improve Berg Balance score to at least 37/56 for decreased fall risk.   Time 8   Period Weeks   Status New   PT LONG TERM GOAL #4   Title Pt will improve gait velocity to at least 2.62 ft/sec for improved efficiency and safety with household and  community gait.   Time 8   Period Weeks   Status New               Plan - 05/20/16 1610    Clinical Impression Statement Bilateral toe-off AFOs delivered today, with orthotic training provided.  With use of rollator, pt demonstrates improved heelstrike, improved step length, and overall improved safety with gait pattern.  With use of tripod base cane, pt demonstrates initial steppage gait, with progression to less steppage gait and improved heelstrike and step length with second and third bouts of gait.  However, pt continues to have strong anterior lean and anterior weightshift, with difficulty stopping quickly when using cane.  Pt would be safet with use of rollator walker during training with orthotics.   Rehab Potential Good   PT Frequency 2x / week   PT Duration 8 weeks  plus eval   PT Treatment/Interventions ADLs/Self Care Home Management;Therapeutic exercise;Therapeutic activities;Functional mobility training;Gait training;Balance training;Neuromuscular re-education;Patient/family education;Orthotic Fit/Training;Electrical Stimulation   PT Next Visit Plan gait training with orthotics, use of heel wedge to assist with improved heelcontact and upright posture; work on weightshifting and decreasing anterior lean/pitch  Need to have pt add more appts   Consulted and Agree with Plan of Care Patient      Patient will benefit from skilled therapeutic intervention in order to improve the following deficits and impairments:  Abnormal gait, Decreased  mobility, Decreased range of motion, Decreased strength, Difficulty walking, Impaired flexibility, Postural dysfunction  Visit Diagnosis: Other abnormalities of gait and mobility  Unsteadiness on feet     Problem List Patient Active Problem List   Diagnosis Date Noted  . Anxiety state 12/10/2014  . Neurogenic bladder 11/30/2014  . Neurogenic bowel 11/30/2014  . Bacterial UTI 11/30/2014  . Thoracic myelopathy 11/29/2014  . Persistent atrial fibrillation (HCC) 11/27/2014  . Traumatic spinal subdural hematoma (HCC)   . Acute pulmonary embolism (HCC)   . Orthostatic hypotension 11/22/2014  . Near syncope 11/21/2014  . Paraplegia at T9 level (HCC) 11/17/2014  . Weakness of both legs   . Nocturnal leg cramps 07/16/2014  . Encounter for therapeutic drug monitoring 12/25/2013  . Malaise and fatigue 06/01/2011  . Atrial flutter (HCC) 03/10/2011  . Long term (current) use of anticoagulants 03/10/2011  . Benign hypertensive heart disease without heart failure 03/10/2011  . Hypercholesterolemia 03/10/2011  . Paroxysmal atrial flutter (HCC)   . Palpitations   . PAC (premature atrial contraction)   . PVC's (premature ventricular contractions)   . Malaise   . Fatigue   . Myalgia     MARRIOTT,AMY W. 05/20/2016, 8:08 AM  Gean Maidens., PT [facility] [address] [place], [place], [address] Phone: [phone]   Fax:  [phone]  Name: JANINE RELLER MRN: [id number] Date of Birth: 09-14-1931

## 2016-05-21 ENCOUNTER — Ambulatory Visit: Payer: Medicare Other | Admitting: Physical Therapy

## 2016-05-21 DIAGNOSIS — R2689 Other abnormalities of gait and mobility: Secondary | ICD-10-CM | POA: Diagnosis not present

## 2016-05-21 DIAGNOSIS — R2681 Unsteadiness on feet: Secondary | ICD-10-CM

## 2016-05-21 DIAGNOSIS — M6281 Muscle weakness (generalized): Secondary | ICD-10-CM

## 2016-05-21 NOTE — Therapy (Signed)
[facility] [address] [place], [place], [address] Phone: [phone]   Fax:  [phone]  Physical Therapy Treatment  Patient Details  Name: [name] Weber MRN: [id number] Date of Birth: 07/08/1931 Referring Provider: [name]  Encounter Date: 05/21/2016      PT End of Session - 05/21/16 1409    Visit Number 13   Number of Visits 17   Date for PT Re-Evaluation 05/30/16   Authorization Type U[employer] Medicare    PT Start Time 1015   PT Stop Time 1100   PT Time Calculation (min) 45 min   Equipment Utilized During Treatment Gait belt   Activity Tolerance Patient tolerated treatment well   Behavior During Therapy [facility] for tasks assessed/performed      Past Medical History  Diagnosis Date  . Paroxysmal atrial flutter (HCC)   . Palpitations     OCCASSIONAL  . PAC (premature atrial contraction)     ISOLATED  . PVC's (premature ventricular contractions)     ISOLATED  . Malaise   . Fatigue   . Myalgia     Past Surgical History  Procedure Laterality Date  . Vulvar lesion removal  01/01/2009    She had a 1-cm area of erythema to the right of the urethral meatus  . Thoracic laminectomy for epidural abscess N/A 11/16/2014    Procedure: THORACIC LAMINECTOMY FOR EPIDURAL ABSCESS;  Surgeon: Karn CassisErnesto M Botero, MD;  Location: [facility];  Service: Neurosurgery;  Laterality: N/A;    There were no vitals filed for this visit.      Subjective Assessment - 05/21/16 1012    Subjective Legs were just tired; ached the first day I wore the braces (wore them all day).  Had a sensation in the front of my legs the first night (like water running)   Patient Stated Goals Pt's goals for therapy are to get out of chair without UE support, feel more secure on feet.   Currently in Pain? No/denies                         [facility] Adult PT Treatment/Exercise - 05/21/16 0001    Transfers   Transfers Sit to Stand;Stand  to Sit   Sit to Stand 5: Supervision;4: Min assist  min guard assist when standing up to cane   Stand to Sit 5: Supervision   Ambulation/Gait   Ambulation/Gait Yes   Ambulation/Gait Assistance 5: Supervision  Min A/HHA with cane   Ambulation Distance (Feet) 120 Feet  rollator, then 80 ft cane, then 500 ft rollator outdoor   Assistive device Rollator;Straight cane  cane with tripod base; bilateral AFOs   Gait Pattern Step-through pattern;Decreased stance time - right;Decreased step length - right;Decreased step length - left;Narrow base of support;Right steppage;Left steppage;Left flexed knee in stance;Right flexed knee in stance;Decreased dorsiflexion - right;Decreased dorsiflexion - left  Cues for increased step length and heelstrike   Ambulation Surface Level;Indoor;Outdoor;Paved   Gait Comments Gait indoors with rollator walker and bilateral AFOs with supervision and cues for improved heelstrike and step length.  Gait training short distance (80 ft) in clinic with tripod base cane with min assist, with widened BOS and steppage gait pattern.  Pt ambulates outdoors on sidewalk x 500 ft with min guard>supervision with rollator, with occasional cues for increased step length and improved heelstrike.   Self-Care   Self-Care Other Self-Care Comments      Self Care: Discussed the following  things with patient and pt's neighbor/friend (Via) -Via concerned about pt's frustration regarding continued weakness/lack of progress to independent walking.  She questions what has happened to change pt's functional status and contribute to increased lower extremity weakness since last bout of therapy 1 year ago.  -PT recommends pt follow up with Dr. Riley Kill with specific questions regarding diagnosis and prognosis/progression and continued functional decline.  -PT discussed purpose of AFOs and reiterated wearing schedule:  To try to wear AFOs 3 hours 1-2 times per day and gradually increase wear time by 30  minutes each day she isn't having soreness in order to work up to wearing all day long.  -PT discussed safety concerns with use of tripod base cane on outdoor surfaces (due to pt's steppage gait pattern and needing to reach out for other UE support when using cane).  Friend and patient agree to looking into getting additional rollator to keep in car to use for outdoor and longer distances.  Pt fearful of only being able to use rollator.  Discussed pt's fear of falling, decreased activity level when walker is not option.  Discussed need for rollator to be primary assistive device at this time.  -Discussed POC, with PT plans to check goals next week.  Plan for renewal after next week to continue to work on gait training with AFOs.          PT Education - 05/21/16 1312    Education provided Yes   Education Details Discussed AFO wear time and importance of needing to use rollator for safety versus cane.  Pt's friend/neighbor agreeable to helping patient buy second rollator to keep in car for outdoor use.   Person(s) Educated Patient;Other (comment)  Friend/neighbor-Via   Methods Explanation;Demonstration   Comprehension Verbalized understanding          PT Short Term Goals - 05/06/16 1136    PT SHORT TERM GOAL #1   Title Pt will be independent with HEP for improved strength, balance, and gait.  TARGET 04/30/16   Time 4   Period Weeks   Status Achieved   PT SHORT TERM GOAL #2   Title Pt will improve TUG score to less than or equal to 17 seconds for decreased fall risk.   Time 4   Period Weeks   Status On-going   PT SHORT TERM GOAL #3   Title Pt will improve Berg Balance score to at least 27/56 for decreased fall risk.   Time 4   Period Weeks   Status Achieved   PT SHORT TERM GOAL #4   Title Pt will perform at least 6 of 10 reps of sit<>stand transfers with minimal UE support for 18-24" surfaces, for improved lower extremity strength and ease of transfers.   Time 4   Period Weeks    Status Achieved   PT SHORT TERM GOAL #5   Title Formal AFO consult to be initiated to address bilateral lower extremity foot drop.   Baseline appointment set for early June for [facility]   Time 4   Period Weeks   Status Achieved           PT Long Term Goals - 04/01/16 0754    PT LONG TERM GOAL #1   Title Pt will verbalize understanding of fall prevention within home environment.  TARGET 05/30/16   Time 8   Period Weeks   Status New   PT LONG TERM GOAL #2   Title Pt will improve TUG score to less than  or equal to 13.5 seconds for decreased fall risk.   Time 8   Period Weeks   Status New   PT LONG TERM GOAL #3   Title Pt will improve Berg Balance score to at least 37/56 for decreased fall risk.   Time 8   Period Weeks   Status New   PT LONG TERM GOAL #4   Title Pt will improve gait velocity to at least 2.62 ft/sec for improved efficiency and safety with household and community gait.   Time 8   Period Weeks   Status New               Plan - 05/21/16 1409    Clinical Impression Statement Pt's friend (healthcare POA) present for session today due to concerns with progression of mobility, difficulties/decreased confidence and new AFOs.  Addressed concerns of pt and friend.  Highly recommended use of rollator walker due to improved safety and improved gait pattern, and friend/patient agree to purchase second rollator to keep in car for outdoor use.  Pt's gait pattern continues to be more anteriorly weigthshifted with decreased step length and decreased heelstrike, but able to be improved with cues.  Pt will continue to benefit from further skilled PT to address strength, balance, gait for improved funcitonal mobility and decreased fall risk.   Rehab Potential Good   PT Frequency 2x / week   PT Duration 8 weeks  plus eval   PT Treatment/Interventions ADLs/Self Care Home Management;Therapeutic exercise;Therapeutic activities;Functional mobility training;Gait training;Balance  training;Neuromuscular re-education;Patient/family education;Orthotic Fit/Training;Electrical Stimulation   PT Next Visit Plan Continue gait training and standing activities orthotics/use of heel wedge to assist with improved heel contact; balance and stregnthening; check LTGs  Need to have pt add more appts   Consulted and Agree with Plan of Care Patient;Family member/caregiver   Family Member Consulted Friend/Healthcare POA (Via)      Patient will benefit from skilled therapeutic intervention in order to improve the following deficits and impairments:  Abnormal gait, Decreased mobility, Decreased range of motion, Decreased strength, Difficulty walking, Impaired flexibility, Postural dysfunction  Visit Diagnosis: Other abnormalities of gait and mobility  Unsteadiness on feet  Muscle weakness (generalized)     Problem List Patient Active Problem List   Diagnosis Date Noted  . Anxiety state 12/10/2014  . Neurogenic bladder 11/30/2014  . Neurogenic bowel 11/30/2014  . Bacterial UTI 11/30/2014  . Thoracic myelopathy 11/29/2014  . Persistent atrial fibrillation (HCC) 11/27/2014  . Traumatic spinal subdural hematoma (HCC)   . Acute pulmonary embolism (HCC)   . Orthostatic hypotension 11/22/2014  . Near syncope 11/21/2014  . Paraplegia at T9 level (HCC) 11/17/2014  . Weakness of both legs   . Nocturnal leg cramps 07/16/2014  . Encounter for therapeutic drug monitoring 12/25/2013  . Malaise and fatigue 06/01/2011  . Atrial flutter (HCC) 03/10/2011  . Long term (current) use of anticoagulants 03/10/2011  . Benign hypertensive heart disease without heart failure 03/10/2011  . Hypercholesterolemia 03/10/2011  . Paroxysmal atrial flutter (HCC)   . Palpitations   . PAC (premature atrial contraction)   . PVC's (premature ventricular contractions)   . Malaise   . Fatigue   . Myalgia     Daman Steffenhagen W. 05/21/2016, 2:14 PM  Gean MaidensMARRIOTT,Amily Depp W., PT [facility] [address] [place], [place], [address] Phone: [phone]   Fax:  [phone]  Name: [name] Weber MRN: [id number] Date of Birth: 01/28/1931

## 2016-05-26 ENCOUNTER — Ambulatory Visit: Payer: Medicare Other | Admitting: Physical Therapy

## 2016-05-26 DIAGNOSIS — R2689 Other abnormalities of gait and mobility: Secondary | ICD-10-CM | POA: Diagnosis not present

## 2016-05-26 DIAGNOSIS — R2681 Unsteadiness on feet: Secondary | ICD-10-CM

## 2016-05-26 NOTE — Therapy (Signed)
[facility] [address] [place], [place], [address] Phone: [phone]   Fax:  [phone]  Physical Therapy Treatment  Patient Details  Name: [name] Weber MRN: [id number] Date of Birth: 10/11/1931 Referring Provider: [name]  Encounter Date: 05/26/2016      PT End of Session - 05/26/16 1042    Visit Number 14   Number of Visits 17   Date for PT Re-Evaluation 05/30/16   Authorization Type [employer]HC Medicare    PT Start Time 0936   PT Stop Time 1016   PT Time Calculation (min) 40 min   Equipment Utilized During Treatment Gait belt   Activity Tolerance Patient tolerated treatment well   Behavior During Therapy [facility] for tasks assessed/performed      Past Medical History  Diagnosis Date  . Paroxysmal atrial flutter (HCC)   . Palpitations     OCCASSIONAL  . PAC (premature atrial contraction)     ISOLATED  . PVC's (premature ventricular contractions)     ISOLATED  . Malaise   . Fatigue   . Myalgia     Past Surgical History  Procedure Laterality Date  . Vulvar lesion removal  01/01/2009    She had a 1-cm area of erythema to the right of the urethral meatus  . Thoracic laminectomy for epidural abscess N/A 11/16/2014    Procedure: THORACIC LAMINECTOMY FOR EPIDURAL ABSCESS;  Surgeon: Karn CassisErnesto M Botero, MD;  Location: [facility];  Service: Neurosurgery;  Laterality: N/A;    There were no vitals filed for this visit.      Subjective Assessment - 05/26/16 0939    Subjective Wearing the braces at least 3-4 hours at a time.  No soreness noted with the braces.  Got a second rollator walker for use in community and brought it into therapy today.   Patient Stated Goals Pt's goals for therapy are to get out of chair without UE support, feel more secure on feet.   Currently in Pain? No/denies                         [facility] Treatment/Exercise - 05/26/16 0945    Transfers   Transfers Sit to  Stand;Stand to Sit   Sit to Stand 5: Supervision;With upper extremity assist;From elevated surface   Stand to Sit 5: Supervision;With upper extremity assist;To elevated surface   Stand to Sit Details --   Number of Reps 10 reps   Comments For functional lower extremity strengthening, standing mini squats x 10 reps to elevated mat surface, with min guard/supervision.   Ambulation/Gait   Ambulation/Gait Yes   Ambulation/Gait Assistance 4: Min guard;5: Supervision   Ambulation/Gait Assistance Details Resisted gait using rollator walker x 230 ft with 3 stops to reset posture with"increased weight on heels."   Ambulation Distance (Feet) 100 Feet  x 2 with rollator   Assistive device Rollator   Gait Pattern Step-through pattern;Decreased stance time - right;Decreased step length - right;Decreased step length - left;Narrow base of support;Right steppage;Left steppage;Left flexed knee in stance;Right flexed knee in stance;Decreased dorsiflexion - right;Decreased dorsiflexion - left  cues for increased step length and heelstrike   Ambulation Surface Level;Indoor   Pre-Gait Activities Pre-gait activities in parallel bars:  forward walking/backward walking with midline board placed, for widened BOS.  Standing at parallel bars with bilateral UE support with therapist provided resistance at hips for improved positioning at hips to decrease excess anterior lean.  Pt  requires multiple cues to reposition to decrease anterior lean.    Gait Comments Pt appears steadier during gait activities today using rollator walker.   Neuro Re-ed    Neuro Re-ed Details  In parallel bars:  at 4" step:  alternating step taps x 10 reps with 1UE support, then forward step ups 10 reps each leg, with cues for glut activation on RLE, then side step ups x 10 reps each leg, all with UE support.  Tandem stance 3 x 15 seconds each position with 1 UE support     With activities in parallel bars, pt requires cues for widened BOS to  improve standing balance.  With resisted gait training, pt noted to have improved trunk rotation to assist with initiation of swing through phase of gait.           PT Education - 05/26/16 1040    Education provided Yes   Education Details discussed standing posture and positioning:  when standing, make sure to stand with "weight through heels." for improved activation of lower extremity musculature and for improved balance.   Person(s) Educated Patient   Methods Explanation;Demonstration   Comprehension Verbalized understanding;Returned demonstration          PT Short Term Goals - 05/06/16 1136    PT SHORT TERM GOAL #1   Title Pt will be independent with HEP for improved strength, balance, and gait.  TARGET 04/30/16   Time 4   Period Weeks   Status Achieved   PT SHORT TERM GOAL #2   Title Pt will improve TUG score to less than or equal to 17 seconds for decreased fall risk.   Time 4   Period Weeks   Status On-going   PT SHORT TERM GOAL #3   Title Pt will improve Berg Balance score to at least 27/56 for decreased fall risk.   Time 4   Period Weeks   Status Achieved   PT SHORT TERM GOAL #4   Title Pt will perform at least 6 of 10 reps of sit<>stand transfers with minimal UE support for 18-24" surfaces, for improved lower extremity strength and ease of transfers.   Time 4   Period Weeks   Status Achieved   PT SHORT TERM GOAL #5   Title Formal AFO consult to be initiated to address bilateral lower extremity foot drop.   Baseline appointment set for early June for [facility]   Time 4   Period Weeks   Status Achieved           PT Long Term Goals - 04/01/16 0754    PT LONG TERM GOAL #1   Title Pt will verbalize understanding of fall prevention within home environment.  TARGET 05/30/16   Time 8   Period Weeks   Status New   PT LONG TERM GOAL #2   Title Pt will improve TUG score to less than or equal to 13.5 seconds for decreased fall risk.   Time 8   Period Weeks    Status New   PT LONG TERM GOAL #3   Title Pt will improve Berg Balance score to at least 37/56 for decreased fall risk.   Time 8   Period Weeks   Status New   PT LONG TERM GOAL #4   Title Pt will improve gait velocity to at least 2.62 ft/sec for improved efficiency and safety with household and community gait.   Time 8   Period Weeks   Status New  Plan - 05/26/16 1042    Clinical Impression Statement Therapy session focused on awareness of posture and positioning in standing, with gait, to decrease excess anterior lean through hips.  Pt responds well to cues for increasing weigthshift through hips today, but needs frequent cues to return to this position.  Pt brings in new rollator walker today, which she is using in community, and pt appears safer wtih gait.  Pt will continue to benefit from further skilled PT to address posture, balance, strengthening, and gait.   Rehab Potential Good   PT Frequency 2x / week   PT Duration 8 weeks  plus eval   PT Treatment/Interventions ADLs/Self Care Home Management;Therapeutic exercise;Therapeutic activities;Functional mobility training;Gait training;Balance training;Neuromuscular re-education;Patient/family education;Orthotic Fit/Training;Electrical Stimulation   PT Next Visit Plan Check LTGs, continue to work on posture and positioning with resistance provided from anterior direction; try standing activities on ramp incline/decline for balance/posture and positioning.  Heel wedge not trialed yet due to LOB noted first trial with those.   Consulted and Agree with Plan of Care Patient;Family member/caregiver   Family Member Consulted Friend/Healthcare POA (Via)      Patient will benefit from skilled therapeutic intervention in order to improve the following deficits and impairments:  Abnormal gait, Decreased mobility, Decreased range of motion, Decreased strength, Difficulty walking, Impaired flexibility, Postural  dysfunction  Visit Diagnosis: Other abnormalities of gait and mobility  Unsteadiness on feet     Problem List Patient Active Problem List   Diagnosis Date Noted  . Anxiety state 12/10/2014  . Neurogenic bladder 11/30/2014  . Neurogenic bowel 11/30/2014  . Bacterial UTI 11/30/2014  . Thoracic myelopathy 11/29/2014  . Persistent atrial fibrillation (HCC) 11/27/2014  . Traumatic spinal subdural hematoma (HCC)   . Acute pulmonary embolism (HCC)   . Orthostatic hypotension 11/22/2014  . Near syncope 11/21/2014  . Paraplegia at T9 level (HCC) 11/17/2014  . Weakness of both legs   . Nocturnal leg cramps 07/16/2014  . Encounter for therapeutic drug monitoring 12/25/2013  . Malaise and fatigue 06/01/2011  . Atrial flutter (HCC) 03/10/2011  . Long term (current) use of anticoagulants 03/10/2011  . Benign hypertensive heart disease without heart failure 03/10/2011  . Hypercholesterolemia 03/10/2011  . Paroxysmal atrial flutter (HCC)   . Palpitations   . PAC (premature atrial contraction)   . PVC's (premature ventricular contractions)   . Malaise   . Fatigue   . Myalgia     Aniqua Briere W. 05/26/2016, 10:52 AM Gean MaidensMARRIOTT,Chanan Detwiler W., PT [facility] [address] [place], [place], [address] Phone: [phone]   Fax:  [phone]  Name: [name] Weber MRN: [id number] Date of Birth: 10/03/1931

## 2016-05-28 ENCOUNTER — Ambulatory Visit: Payer: Medicare Other | Admitting: Physical Therapy

## 2016-05-28 DIAGNOSIS — R2689 Other abnormalities of gait and mobility: Secondary | ICD-10-CM

## 2016-05-28 DIAGNOSIS — M6281 Muscle weakness (generalized): Secondary | ICD-10-CM

## 2016-05-28 DIAGNOSIS — R2681 Unsteadiness on feet: Secondary | ICD-10-CM

## 2016-05-28 NOTE — Therapy (Signed)
[facility] [address] [place], [place], [address] Phone: [phone]   Fax:  [phone]  Physical Therapy Treatment  Patient Details  Name: [name] Weber MRN: [id number] Date of Birth: 1931-11-11 Referring Provider: Naaman Plummer  Encounter Date: 05/28/2016      PT End of Session - 05/28/16 1036    Visit Number 15   Number of Visits 17   Date for PT Re-Evaluation 05/30/16   Authorization Type [employer] Medicare    PT Start Time 0933   PT Stop Time 1017   PT Time Calculation (min) 44 min   Equipment Utilized During Treatment Gait belt   Activity Tolerance Patient tolerated treatment well   Behavior During Therapy [facility] for tasks assessed/performed      Past Medical History  Diagnosis Date  . Paroxysmal atrial flutter ([place])   . Palpitations     OCCASSIONAL  . PAC (premature atrial contraction)     ISOLATED  . PVC's (premature ventricular contractions)     ISOLATED  . Malaise   . Fatigue   . Myalgia     Past Surgical History  Procedure Laterality Date  . Vulvar lesion removal  01/01/2009    She had a 1-cm area of erythema to the right of the urethral meatus  . Thoracic laminectomy for epidural abscess N/A 11/16/2014    Procedure: THORACIC LAMINECTOMY FOR EPIDURAL ABSCESS;  Surgeon: Floyce Stakes, MD;  Location: [facility];  Service: Neurosurgery;  Laterality: N/A;    There were no vitals filed for this visit.      Subjective Assessment - 05/28/16 0935    Subjective Went with my neighbor out yesterday for a long while and it just tired today.  Wore the braces two times yesterday with several hour break in between.  They are not hurting my legs.  Still get a crazy sensation in my legs and feet at night.   Patient Stated Goals Pt's goals for therapy are to get out of chair without UE support, feel more secure on feet.   Currently in Pain? No/denies                         [facility] Adult PT  Treatment/Exercise - 05/28/16 0942    Transfers   Transfers Sit to Stand;Stand to Sit   Sit to Stand 5: Supervision;With upper extremity assist;From elevated surface   Stand to Sit 5: Supervision;With upper extremity assist;To elevated surface   Number of Reps Other reps (comment)  5 reps   Ambulation/Gait   Ambulation/Gait Yes   Ambulation/Gait Assistance 5: Supervision   Ambulation/Gait Assistance Details Resisted gait x 345 ft (115 ft resisted posteiorly, 230 resisted anteriorly) in attempts to assist in more neutral pelvic positioning, while using rollator   Ambulation Distance (Feet) 465 Feet  3 minute walk test   Assistive device Rollator   Gait Pattern Step-through pattern;Decreased stance time - right;Decreased step length - right;Decreased step length - left;Narrow base of support;Left flexed knee in stance;Right flexed knee in stance;Decreased dorsiflexion - right;Decreased dorsiflexion - left  cues for increased heelstrike   Ambulation Surface Level;Indoor   Gait velocity 12.39 sec= 2.65 ft/sec  with rollator   Pre-Gait Activities Pre gait activiities in parallel bars standing with UE support, resistance given in varied directions at hips to improve neutral pelvic positioing.  Pt needs occasional cues to reset posture, by weightshifting onto heels for more neutral pelvic posture.   Standardized  Balance Assessment   Standardized Balance Assessment Berg Balance Test   Berg Balance Test   Sit to Stand Able to stand  independently using hands   Standing Unsupported Able to stand safely 2 minutes   Sitting with Back Unsupported but Feet Supported on Floor or Stool Able to sit safely and securely 2 minutes   Stand to Sit Controls descent by using hands   Transfers Able to transfer safely, definite need of hands   Standing Unsupported with Eyes Closed Able to stand 10 seconds with supervision   Standing Ubsupported with Feet Together Needs help to attain position but able to stand  for 30 seconds with feet together   From Standing, Reach Forward with Outstretched Arm Loses balance while trying/requires external support  Needs UE support to hold   From Standing Position, Pick up Object from Floor Unable to try/needs assist to keep balance   From Standing Position, Turn to Look Behind Over each Shoulder Turn sideways only but maintains balance   Turn 360 Degrees Needs assistance while turning   Standing Unsupported, Alternately Place Feet on Step/Stool Needs assistance to keep from falling or unable to try   Standing Unsupported, One Foot in Front Needs help to step but can hold 15 seconds   Standing on One Leg Unable to try or needs assist to prevent fall   Total Score 24   Timed Up and Go Test   TUG Normal TUG   Normal TUG (seconds) 17.54  16.28 sec at best; with rollator                PT Education - 05/28/16 1035    Education provided Yes   Education Details Discussed progress with goals, POC, discussed pt's goals for continueing therapy   Person(s) Educated Patient   Methods Explanation   Comprehension Verbalized understanding          PT Short Term Goals - 05/06/16 1136    PT SHORT TERM GOAL #1   Title Pt will be independent with HEP for improved strength, balance, and gait.  TARGET 04/30/16   Time 4   Period Weeks   Status Achieved   PT SHORT TERM GOAL #2   Title Pt will improve TUG score to less than or equal to 17 seconds for decreased fall risk.   Time 4   Period Weeks   Status On-going   PT SHORT TERM GOAL #3   Title Pt will improve Berg Balance score to at least 27/56 for decreased fall risk.   Time 4   Period Weeks   Status Achieved   PT SHORT TERM GOAL #4   Title Pt will perform at least 6 of 10 reps of sit<>stand transfers with minimal UE support for 18-24" surfaces, for improved lower extremity strength and ease of transfers.   Time 4   Period Weeks   Status Achieved   PT SHORT TERM GOAL #5   Title Formal AFO consult to be  initiated to address bilateral lower extremity foot drop.   Baseline appointment set for early June for [facility]   Time 4   Period Weeks   Status Achieved           PT Long Term Goals - 05/28/16 2060    PT LONG TERM GOAL #1   Title Pt will verbalize understanding of fall prevention within home environment.  TARGET 05/30/16   Time 8   Period Weeks   Status On-going   PT LONG TERM  GOAL #2   Title Pt will improve TUG score to less than or equal to 13.5 seconds for decreased fall risk.   Baseline TUG 16.28 sec at best with rollator 05/28/16 (Progressed from 20 seconds last check)   Time 8   Period Weeks   Status Not Met   PT LONG TERM GOAL #3   Title Pt will improve Berg Balance score to at least 37/56 for decreased fall risk.   Baseline 24/56 on 05/28/16 (decreased from 27/56, likely due to new bilateral AFOs)   Time 8   Period Weeks   Status Not Met   PT LONG TERM GOAL #4   Title Pt will improve gait velocity to at least 2.62 ft/sec for improved efficiency and safety with household and community gait.   Baseline gait velocity 2.65 ft/sec 05/28/16   Time 8   Period Weeks   Status Achieved               Plan - 05/28/16 1039    Clinical Impression Statement LTGs assessed today-LTG #1 for fall prevention ongoing.  LTG #2 and #2 not met, but pt has made progress towards TUG with rollator.  Berg score has decreased, but likely due to pt's adjusting to new bilateral AFOs received last week.  Pt has met LTG #4 for gait velocity.  Pt  will benefit from further skilled PT to further address posture, strenghtening, balance and gait for improved funcitonal moiblity and decreased fall risk.  Recert/renewal to be completed next visit.   Rehab Potential Good   PT Frequency 2x / week   PT Duration 8 weeks  plus eval   PT Treatment/Interventions ADLs/Self Care Home Management;Therapeutic exercise;Therapeutic activities;Functional mobility training;Gait training;Balance training;Neuromuscular  re-education;Patient/family education;Orthotic Fit/Training;Electrical Stimulation   PT Next Visit Plan Recert/renewal and goals to be completed next visit.  Try standing activities on ramp incline/decline for balance/posture and positioning.  Resistance in standing at hips; stepping strategy for balance  Heel wedge not trialed yet due to LOB noted first trial with those.   Consulted and Agree with Plan of Care Patient;Family member/caregiver   Family Member Consulted Friend/Healthcare POA ([facility]ia)      Patient will benefit from skilled therapeutic intervention in order to improve the following deficits and impairments:  Abnormal gait, Decreased mobility, Decreased range of motion, Decreased strength, Difficulty walking, Impaired flexibility, Postural dysfunction  Visit Diagnosis: Unsteadiness on feet  Other abnormalities of gait and mobility  Muscle weakness (generalized)     Problem List Patient Active Problem List   Diagnosis Date Noted  . Anxiety state 12/10/2014  . Neurogenic bladder 11/30/2014  . Neurogenic bowel 11/30/2014  . Bacterial UTI 11/30/2014  . Thoracic myelopathy 11/29/2014  . Persistent atrial fibrillation ([place]) 11/27/2014  . Traumatic spinal subdural hematoma ([facility])   . Acute pulmonary embolism ([place])   . Orthostatic hypotension 11/22/2014  . Near syncope 11/21/2014  . Paraplegia at T9 level ([facility]) 11/17/2014  . Weakness of both legs   . Nocturnal leg cramps 07/16/2014  . Encounter for therapeutic drug monitoring 12/25/2013  . Malaise and fatigue 06/01/2011  . Atrial flutter ([place]) 03/10/2011  . Long term (current) use of anticoagulants 03/10/2011  . Benign hypertensive heart disease without heart failure 03/10/2011  . Hypercholesterolemia 03/10/2011  . Paroxysmal atrial flutter ([place])   . Palpitations   . PAC (premature atrial contraction)   . PVC's (premature ventricular contractions)   . Malaise   . Fatigue   . Myalgia  MARRIOTT,AMY W. 05/28/2016,  10:47 AM  Frazier Butt., PT  [facility] [address] [place], [place], [address] Phone: [phone]   Fax:  [phone]  Name: AFRIKA BRICK MRN: [id number] Date of Birth: 03-Aug-1931

## 2016-05-30 ENCOUNTER — Inpatient Hospital Stay (HOSPITAL_COMMUNITY)
Admission: EM | Admit: 2016-05-30 | Discharge: 2016-06-06 | DRG: 552 | Disposition: A | Discharge status: Skilled Nursing Facility | Payer: Medicare Other | Attending: Family Medicine | Admitting: Family Medicine

## 2016-05-30 ENCOUNTER — Inpatient Hospital Stay (HOSPITAL_COMMUNITY): Payer: Medicare Other

## 2016-05-30 ENCOUNTER — Encounter (HOSPITAL_COMMUNITY): Payer: Self-pay | Admitting: Internal Medicine

## 2016-05-30 ENCOUNTER — Observation Stay (HOSPITAL_COMMUNITY): Payer: Medicare Other

## 2016-05-30 ENCOUNTER — Emergency Department (HOSPITAL_COMMUNITY): Payer: Medicare Other

## 2016-05-30 DIAGNOSIS — Y9289 Other specified places as the place of occurrence of the external cause: Secondary | ICD-10-CM

## 2016-05-30 DIAGNOSIS — K5900 Constipation, unspecified: Secondary | ICD-10-CM | POA: Diagnosis present

## 2016-05-30 DIAGNOSIS — S22069A Unspecified fracture of T7-T8 vertebra, initial encounter for closed fracture: Secondary | ICD-10-CM | POA: Diagnosis present

## 2016-05-30 DIAGNOSIS — I481 Persistent atrial fibrillation: Secondary | ICD-10-CM | POA: Diagnosis present

## 2016-05-30 DIAGNOSIS — Z87891 Personal history of nicotine dependence: Secondary | ICD-10-CM | POA: Diagnosis not present

## 2016-05-30 DIAGNOSIS — Z79899 Other long term (current) drug therapy: Secondary | ICD-10-CM | POA: Diagnosis not present

## 2016-05-30 DIAGNOSIS — I4819 Other persistent atrial fibrillation: Secondary | ICD-10-CM

## 2016-05-30 DIAGNOSIS — I480 Paroxysmal atrial fibrillation: Secondary | ICD-10-CM | POA: Diagnosis present

## 2016-05-30 DIAGNOSIS — G062 Extradural and subdural abscess, unspecified: Secondary | ICD-10-CM

## 2016-05-30 DIAGNOSIS — S32020S Wedge compression fracture of second lumbar vertebra, sequela: Secondary | ICD-10-CM

## 2016-05-30 DIAGNOSIS — IMO0001 Reserved for inherently not codable concepts without codable children: Secondary | ICD-10-CM

## 2016-05-30 DIAGNOSIS — Z7901 Long term (current) use of anticoagulants: Secondary | ICD-10-CM

## 2016-05-30 DIAGNOSIS — Z888 Allergy status to other drugs, medicaments and biological substances status: Secondary | ICD-10-CM

## 2016-05-30 DIAGNOSIS — K5909 Other constipation: Secondary | ICD-10-CM | POA: Insufficient documentation

## 2016-05-30 DIAGNOSIS — M8580 Other specified disorders of bone density and structure, unspecified site: Secondary | ICD-10-CM | POA: Diagnosis present

## 2016-05-30 DIAGNOSIS — S32020A Wedge compression fracture of second lumbar vertebra, initial encounter for closed fracture: Secondary | ICD-10-CM

## 2016-05-30 DIAGNOSIS — R030 Elevated blood-pressure reading, without diagnosis of hypertension: Secondary | ICD-10-CM | POA: Diagnosis present

## 2016-05-30 DIAGNOSIS — E785 Hyperlipidemia, unspecified: Secondary | ICD-10-CM | POA: Diagnosis present

## 2016-05-30 DIAGNOSIS — S32021A Stable burst fracture of second lumbar vertebra, initial encounter for closed fracture: Secondary | ICD-10-CM | POA: Diagnosis present

## 2016-05-30 DIAGNOSIS — S22000A Wedge compression fracture of unspecified thoracic vertebra, initial encounter for closed fracture: Secondary | ICD-10-CM

## 2016-05-30 DIAGNOSIS — Z7982 Long term (current) use of aspirin: Secondary | ICD-10-CM | POA: Diagnosis not present

## 2016-05-30 DIAGNOSIS — S32000A Wedge compression fracture of unspecified lumbar vertebra, initial encounter for closed fracture: Secondary | ICD-10-CM

## 2016-05-30 DIAGNOSIS — S32029A Unspecified fracture of second lumbar vertebra, initial encounter for closed fracture: Secondary | ICD-10-CM | POA: Diagnosis present

## 2016-05-30 DIAGNOSIS — K567 Ileus, unspecified: Secondary | ICD-10-CM

## 2016-05-30 DIAGNOSIS — E7800 Pure hypercholesterolemia, unspecified: Secondary | ICD-10-CM | POA: Diagnosis present

## 2016-05-30 HISTORY — DX: Wedge compression fracture of second lumbar vertebra, initial encounter for closed fracture: S32.020A

## 2016-05-30 HISTORY — DX: Reserved for inherently not codable concepts without codable children: IMO0001

## 2016-05-30 HISTORY — DX: Unspecified fracture of second lumbar vertebra, initial encounter for closed fracture: S32.029A

## 2016-05-30 LAB — CBC
HCT: 44 % (ref 36.0–46.0)
Hemoglobin: 14.2 g/dL (ref 12.0–15.0)
MCH: 31.3 pg (ref 26.0–34.0)
MCHC: 32.3 g/dL (ref 30.0–36.0)
MCV: 96.9 fL (ref 78.0–100.0)
Platelets: 169 10*3/uL (ref 150–400)
RBC: 4.54 MIL/uL (ref 3.87–5.11)
RDW: 13.2 % (ref 11.5–15.5)
WBC: 10.5 10*3/uL (ref 4.0–10.5)

## 2016-05-30 LAB — I-STAT CHEM 8, ED
BUN: 19 mg/dL (ref 6–20)
Calcium, Ion: 1.04 mmol/L — ABNORMAL LOW (ref 1.12–1.23)
Chloride: 101 mmol/L (ref 101–111)
Creatinine, Ser: 0.6 mg/dL (ref 0.44–1.00)
Glucose, Bld: 130 mg/dL — ABNORMAL HIGH (ref 65–99)
HCT: 45 % (ref 36.0–46.0)
Hemoglobin: 15.3 g/dL — ABNORMAL HIGH (ref 12.0–15.0)
Potassium: 4.6 mmol/L (ref 3.5–5.1)
Sodium: 138 mmol/L (ref 135–145)
TCO2: 28 mmol/L (ref 0–100)

## 2016-05-30 MED ORDER — ONDANSETRON HCL 4 MG/2ML IJ SOLN
4.0000 mg | Freq: Once | INTRAMUSCULAR | Status: AC
  Administered 2016-05-30: 4 mg via INTRAVENOUS
  Filled 2016-05-30: qty 2

## 2016-05-30 MED ORDER — HYDRALAZINE HCL 20 MG/ML IJ SOLN: 5.0000 mg | INTRAMUSCULAR | Status: DC | PRN

## 2016-05-30 MED ORDER — HYDROMORPHONE HCL 1 MG/ML IJ SOLN: 0.5000 mg | INTRAMUSCULAR | Status: DC | PRN

## 2016-05-30 MED ORDER — OXYCODONE-ACETAMINOPHEN 5-325 MG PO TABS
1.0000 | ORAL_TABLET | ORAL | Status: DC | PRN
  Administered 2016-05-30 – 2016-06-05 (×12): 1 via ORAL
  Filled 2016-05-30 (×13): qty 1

## 2016-05-30 MED ORDER — ACETAMINOPHEN 325 MG PO TABS
650.0000 mg | ORAL_TABLET | Freq: Four times a day (QID) | ORAL | Status: DC | PRN
  Administered 2016-05-31 – 2016-06-05 (×4): 650 mg via ORAL
  Filled 2016-05-30 (×4): qty 2

## 2016-05-30 MED ORDER — SODIUM CHLORIDE 0.9 % IV SOLN
INTRAVENOUS | Status: DC
  Administered 2016-05-31: 02:00:00 via INTRAVENOUS

## 2016-05-30 MED ORDER — FENTANYL CITRATE (PF) 100 MCG/2ML IJ SOLN
50.0000 ug | Freq: Once | INTRAMUSCULAR | Status: AC
  Administered 2016-05-30: 50 ug via INTRAVENOUS
  Filled 2016-05-30: qty 2

## 2016-05-30 MED ORDER — ONDANSETRON HCL 4 MG/2ML IJ SOLN
4.0000 mg | Freq: Four times a day (QID) | INTRAMUSCULAR | Status: DC | PRN
  Administered 2016-05-31 – 2016-06-06 (×7): 4 mg via INTRAVENOUS
  Filled 2016-05-30 (×7): qty 2

## 2016-05-30 MED ORDER — LIDOCAINE HCL 2 % IJ SOLN: 20.0000 mL | Freq: Once | INTRAMUSCULAR | Status: DC

## 2016-05-30 MED ORDER — HYDROCODONE-ACETAMINOPHEN 5-325 MG PO TABS: 1.0000 | ORAL_TABLET | Freq: Four times a day (QID) | ORAL | Status: DC | PRN

## 2016-05-30 MED ORDER — HYDROMORPHONE HCL 1 MG/ML IJ SOLN
0.5000 mg | INTRAMUSCULAR | Status: DC | PRN
  Administered 2016-05-31 – 2016-06-04 (×4): 0.5 mg via INTRAVENOUS
  Filled 2016-05-30 (×4): qty 1

## 2016-05-30 MED ORDER — MORPHINE SULFATE (PF) 4 MG/ML IV SOLN: 4.0000 mg | Freq: Once | INTRAVENOUS | Status: DC

## 2016-05-30 MED ORDER — ONDANSETRON HCL 4 MG PO TABS
4.0000 mg | ORAL_TABLET | Freq: Four times a day (QID) | ORAL | Status: DC | PRN
  Administered 2016-05-31 – 2016-06-01 (×2): 4 mg via ORAL
  Filled 2016-05-30 (×2): qty 1

## 2016-05-30 MED ORDER — DIGOXIN 125 MCG PO TABS
125.0000 ug | ORAL_TABLET | Freq: Every day | ORAL | Status: DC
  Administered 2016-05-31 – 2016-06-06 (×7): 125 ug via ORAL
  Filled 2016-05-30 (×7): qty 1

## 2016-05-30 MED ORDER — PAROXETINE HCL 20 MG PO TABS: 10.0000 mg | ORAL_TABLET | Freq: Every day | ORAL | Status: DC

## 2016-05-30 MED ORDER — FENTANYL CITRATE (PF) 100 MCG/2ML IJ SOLN
100.0000 ug | Freq: Once | INTRAMUSCULAR | Status: AC
  Administered 2016-05-30: 100 ug via INTRAVENOUS
  Filled 2016-05-30: qty 2

## 2016-05-30 MED ORDER — OXYCODONE HCL 5 MG PO TABS
5.0000 mg | ORAL_TABLET | ORAL | Status: DC | PRN
  Administered 2016-06-02 – 2016-06-05 (×6): 5 mg via ORAL
  Filled 2016-05-30 (×7): qty 1

## 2016-05-30 MED ORDER — ACETAMINOPHEN 650 MG RE SUPP: 650.0000 mg | Freq: Four times a day (QID) | RECTAL | Status: DC | PRN

## 2016-05-30 NOTE — ED Provider Notes (Signed)
Patient has received several doses of IV pain medicine.  She is still unable to ambulate even with the TLSO and walker.  Patient will need to be admitted for PT/OT, pain control.  Discussed with Dr. Particia NearingHaviland, who agrees with the plan.  7:12 PM Discussed with Dr. Toniann FailKakrakandy, who recommends consultation with trauma surgery.  Trauma surgery recommends consultation with neurosurgery.  Ok with medicine admit.  Dr. Wynetta Emeryram recommends CT L-spine.  Will consult tomorrow morning.  9:40 PM Dr. Wynetta Emeryram called back after CT scan.  He is on his way to evaluate the patient because of CT findings consistent with burst fracture.  Roxy Horsemanobert Kayde Warehime, PA-C 05/30/16 2033  Roxy Horsemanobert Ellsie Violette, PA-C 05/30/16 [phone]  Jacalyn LefevreJulie Haviland, MD 05/31/16 [id number]

## 2016-05-30 NOTE — ED Notes (Signed)
Patient still in too much pain to ambulate; even with brace applied.

## 2016-05-30 NOTE — ED Notes (Signed)
Bio tech at bedside working on applying TLSO brace.
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
## 2016-05-30 NOTE — Progress Notes (Signed)
Orthopedic Tech Progress Note Patient Details:  Stephani Policevelyn C Rampersaud 03/03/1931 [id number] Called in brace order to [employer]. Patient ID: Stephani Policevelyn C Kocourek, female   DOB: 08/30/1931, 82 y.o.   MRN: [id number]   Lesle ChrisGilliland, Anapaola Kinsel L 05/30/2016, 4:24 PM

## 2016-05-30 NOTE — ED Notes (Addendum)
Yellow socks placed on pt. Unable to get pt up to ambulate due to pain. PA Riley in room while attempting to get pt out of bed.

## 2016-05-30 NOTE — ED Notes (Signed)
Attempted ambulation; patient could not get out of bed due to pain. Even moving her leg to the side of the bed was too painful. Could not tolerate sitting upright to get to standing position. Notified Dr. Ethelda ChickJacubowitz.
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
## 2016-05-31 ENCOUNTER — Inpatient Hospital Stay (HOSPITAL_COMMUNITY): Payer: Medicare Other

## 2016-05-31 ENCOUNTER — Other Ambulatory Visit (HOSPITAL_COMMUNITY): Payer: Medicare Other

## 2016-05-31 DIAGNOSIS — R030 Elevated blood-pressure reading, without diagnosis of hypertension: Secondary | ICD-10-CM | POA: Diagnosis not present

## 2016-05-31 DIAGNOSIS — S32022D Unstable burst fracture of second lumbar vertebra, subsequent encounter for fracture with routine healing: Secondary | ICD-10-CM | POA: Diagnosis not present

## 2016-05-31 DIAGNOSIS — S32020A Wedge compression fracture of second lumbar vertebra, initial encounter for closed fracture: Secondary | ICD-10-CM | POA: Diagnosis not present

## 2016-05-31 DIAGNOSIS — E7800 Pure hypercholesterolemia, unspecified: Secondary | ICD-10-CM | POA: Diagnosis not present

## 2016-05-31 DIAGNOSIS — S32020S Wedge compression fracture of second lumbar vertebra, sequela: Secondary | ICD-10-CM | POA: Diagnosis not present

## 2016-05-31 LAB — CBC WITH DIFFERENTIAL/PLATELET
Basophils Absolute: 0 10*3/uL (ref 0.0–0.1)
Basophils Relative: 0 %
Eosinophils Absolute: 0 10*3/uL (ref 0.0–0.7)
Eosinophils Relative: 0 %
HCT: 41.8 % (ref 36.0–46.0)
Hemoglobin: 13.4 g/dL (ref 12.0–15.0)
Lymphocytes Relative: 9 %
Lymphs Abs: 0.8 10*3/uL (ref 0.7–4.0)
MCH: 31.1 pg (ref 26.0–34.0)
MCHC: 32.1 g/dL (ref 30.0–36.0)
MCV: 97 fL (ref 78.0–100.0)
Monocytes Absolute: 0.3 10*3/uL (ref 0.1–1.0)
Monocytes Relative: 4 %
Neutro Abs: 7.9 10*3/uL — ABNORMAL HIGH (ref 1.7–7.7)
Neutrophils Relative %: 87 %
Platelets: 162 10*3/uL (ref 150–400)
RBC: 4.31 MIL/uL (ref 3.87–5.11)
RDW: 13.1 % (ref 11.5–15.5)
WBC: 9 10*3/uL (ref 4.0–10.5)

## 2016-05-31 LAB — COMPREHENSIVE METABOLIC PANEL
ALT: 23 U/L (ref 14–54)
AST: 32 U/L (ref 15–41)
Albumin: 3.1 g/dL — ABNORMAL LOW (ref 3.5–5.0)
Alkaline Phosphatase: 56 U/L (ref 38–126)
Anion gap: 10 (ref 5–15)
BUN: 13 mg/dL (ref 6–20)
CO2: 26 mmol/L (ref 22–32)
Calcium: 9.1 mg/dL (ref 8.9–10.3)
Chloride: 101 mmol/L (ref 101–111)
Creatinine, Ser: 0.59 mg/dL (ref 0.44–1.00)
GFR calc Af Amer: >60 mL/min (ref >60)
GFR calc non Af Amer: >60 mL/min (ref >60)
Glucose, Bld: 138 mg/dL — ABNORMAL HIGH (ref 65–99)
Potassium: 4.1 mmol/L (ref 3.5–5.1)
Sodium: 137 mmol/L (ref 135–145)
Total Bilirubin: 0.7 mg/dL (ref 0.3–1.2)
Total Protein: 6.8 g/dL (ref 6.5–8.1)

## 2016-05-31 LAB — C-REACTIVE PROTEIN: CRP: 0.9 mg/dL (ref <1.0)

## 2016-05-31 LAB — SEDIMENTATION RATE: Sed Rate: 12 mm/hr (ref 0–22)

## 2016-05-31 MED ORDER — GADOBENATE DIMEGLUMINE 529 MG/ML IV SOLN
10.0000 mL | Freq: Once | INTRAVENOUS | Status: AC | PRN
  Administered 2016-05-31: 9 mL via INTRAVENOUS
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
## 2016-06-01 ENCOUNTER — Inpatient Hospital Stay (HOSPITAL_COMMUNITY): Payer: Medicare Other

## 2016-06-01 DIAGNOSIS — E7800 Pure hypercholesterolemia, unspecified: Secondary | ICD-10-CM | POA: Diagnosis not present

## 2016-06-01 DIAGNOSIS — S32022D Unstable burst fracture of second lumbar vertebra, subsequent encounter for fracture with routine healing: Secondary | ICD-10-CM | POA: Diagnosis not present

## 2016-06-01 DIAGNOSIS — R030 Elevated blood-pressure reading, without diagnosis of hypertension: Secondary | ICD-10-CM | POA: Diagnosis not present

## 2016-06-01 MED ORDER — PROMETHAZINE HCL 25 MG/ML IJ SOLN
6.2500 mg | Freq: Four times a day (QID) | INTRAMUSCULAR | Status: DC | PRN
  Administered 2016-06-01: 6.25 mg via INTRAVENOUS
  Filled 2016-06-01: qty 1

## 2016-06-01 MED ORDER — POLYETHYLENE GLYCOL 3350 17 G PO PACK
17.0000 g | PACK | Freq: Every day | ORAL | Status: DC
  Administered 2016-06-01 – 2016-06-03 (×3): 17 g via ORAL
  Filled 2016-06-01 (×3): qty 1
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
## 2016-06-01 NOTE — Progress Notes (Signed)
Patient ID: [name] Weber, female   DOB: 06/21/1931, 82 y.o.   MRN: [id number] Saw mrs Taha and spoke with close friends who are helping her. No need for surgery at present but she needs to wear a TLSO to help to heal and decrease the pain. Will call rehab medicine for consult. They ser as an outpatient. Will follow

## 2016-06-01 NOTE — Care Management Important Message (Signed)
Important Message  Patient Details  Name: [name] Weber MRN: [id number] Date of Birth: 05/27/1931   Medicare Important Message Given:  Yes    Shaquanna Lycan Abena 06/01/2016, 11:26 AM

## 2016-06-02 DIAGNOSIS — S32020S Wedge compression fracture of second lumbar vertebra, sequela: Secondary | ICD-10-CM

## 2016-06-02 DIAGNOSIS — G8220 Paraplegia, unspecified: Secondary | ICD-10-CM

## 2016-06-02 MED ORDER — GLYCERIN (LAXATIVE) 2.1 G RE SUPP
1.0000 | Freq: Every day | RECTAL | Status: DC | PRN
  Administered 2016-06-02: 1 via RECTAL
  Filled 2016-06-02 (×2): qty 1

## 2016-06-02 NOTE — Progress Notes (Signed)
PT Cancellation Note  Patient Details Name: [name] Weber MRN: [id number] DOB: 12/01/1930   Cancelled Treatment:    Reason Eval/Treat Not Completed: Other (comment)  Pt indicates very nauseated and having just had a suppository and is waiting for it to work.  Pt requested to hold PT at this time.  Will f/u another time.     Sunny SchleinRitenour, Muhammadali Ries F, [place]PT [phone] 06/02/2016, 10:35 AM
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
## 2016-06-02 NOTE — Clinical Social Work Note (Signed)
Clinical Social Work Assessment  Patient Details  Name: [name] Weber MRN: [id number] Date of Birth: 1931-03-09  Date of referral:  06/02/16               Reason for consult:  Facility Placement (CIR vs SNF)                Permission sought to share information with:  Case Manager, Customer service manager, Family Supports Permission granted to share information::  Yes, Verbal Permission Granted  Name::        Agency::  SNF search if CIR denies  Relationship::  family (daughter at bedside)  Contact Information:     Housing/Transportation Living arrangements for the past 2 months:  Single Family Home Source of Information:  Medical Team, Adult Children, Friend/Neighbor Patient Interpreter Needed:  None Criminal Activity/Legal Involvement Pertinent to Current Situation/Hospitalization:  No - Comment as needed Significant Relationships:  Adult Children, Other Family Members, Friend, Warehouse manager Lives with:  Self, Adult Children Do you feel safe going back to the place where you live?  No Need for family participation in patient care:  Yes (Comment)  Care giving concerns:  Main concern is assistnace after discharge either with SNF or CIR.  Family agreeable to some sort of placement as patient requires additional help. Patient currently being evaluated by CIR with SNF as a back up. Good family support.   Social Worker assessment / plan:  LCSW met briefly with family at the bedside but was interrupted with CIR MD coming to asses patient. Explained role and services and explained SNF as back up as CIR is first choice and family wanting CIR.  LCSW will follow acutely and if patient accepted to CIR will sign off, but if patient is unable to attend CIR will have completed work up for SNF.  LCSW completed work up on 7/4 and patient/family will need bed offers for 7/5.  Family voices no other concerns at this time.  Employment status:  Retired Office manager PT Recommendations:  Inpatient [place], [place] / Referral to community resources:  [place]  Patient/Family's Response to care:  Agreeable to plan  Patient/Family's Understanding of and Emotional Response to Diagnosis, Current Treatment, and Prognosis:  Unable to complete due to MD coming into room.  Family understanding of patient's needs and agreeable to plan for some sort of rehab at DC.   Emotional Assessment Appearance:  Appears stated age Attitude/Demeanor/Rapport:  Other, Lethargic (resting in bed, met with family at bedside) Affect (typically observed):  Accepting, Adaptable Orientation:  Oriented to Self, Oriented to Place, Oriented to  Time, Oriented to Situation Alcohol / Substance use:  Not Applicable Psych involvement (Current and /or in the community):  No (Comment)  Discharge Needs  Concerns to be addressed:  No discharge needs identified Readmission within the last 30 days:  No Current discharge risk:  None Barriers to Discharge:  Continued Medical Work up, [employer], [place], LCSW 06/02/2016, 10:18 AM

## 2016-06-02 NOTE — [place] FL2 (Signed)
[place] MEDICAID FL2 LEVEL OF CARE SCREENING TOOL     IDENTIFICATION  Patient Name: Stephani Policevelyn C Morten Birthdate: 02/20/1931 Sex: female Admission Date (Current Location): 05/30/2016  [place] [facility] [place]   Facility and Address:  [facility] [place], [address], [place], [place] [address]      Provider Number: [id number]  Attending Physician Name and Address:  Leatha Gildingostin M Gherghe, MD  Relative Name and Phone Number:       Current Level of Care: Hospital Recommended Level of Care: Skilled Nursing Facility Prior Approval Number:    Date Approved/Denied:   PASRR Number:  [id number]  Discharge Plan: SNF    Current Diagnoses: Patient Active Problem List   Diagnosis Date Noted  . Compression fracture of L2 (HCC) 05/30/2016  . Elevated blood pressure 05/30/2016  . L2 vertebral fracture (HCC) 05/30/2016  . Anxiety state 12/10/2014  . Neurogenic bladder 11/30/2014  . Neurogenic bowel 11/30/2014  . Bacterial UTI 11/30/2014  . Thoracic myelopathy 11/29/2014  . Persistent atrial fibrillation (HCC) 11/27/2014  . Traumatic spinal subdural hematoma (HCC)   . Acute pulmonary embolism (HCC)   . Orthostatic hypotension 11/22/2014  . Near syncope 11/21/2014  . Paraplegia at T9 level (HCC) 11/17/2014  . Weakness of both legs   . Nocturnal leg cramps 07/16/2014  . Encounter for therapeutic drug monitoring 12/25/2013  . Malaise and fatigue 06/01/2011  . Atrial flutter (HCC) 03/10/2011  . Long term (current) use of anticoagulants 03/10/2011  . Benign hypertensive heart disease without heart failure 03/10/2011  . Hypercholesterolemia 03/10/2011  . Paroxysmal atrial flutter (HCC)   . Palpitations   . PAC (premature atrial contraction)   . PVC's (premature ventricular contractions)   . Malaise   . Fatigue   . Myalgia     Orientation RESPIRATION BLADDER Height & Weight     Self, Time, Situation, Place  Normal Continent Weight: 104 lb (47.174  kg) Height:  5\' 6"  (167.6 cm)  BEHAVIORAL SYMPTOMS/MOOD NEUROLOGICAL BOWEL NUTRITION STATUS      Continent Diet (regular)  AMBULATORY STATUS COMMUNICATION OF NEEDS Skin   Extensive Assist Verbally Normal                       Personal Care Assistance Level of Assistance  Bathing, Feeding, Dressing Bathing Assistance: Limited assistance Feeding assistance: Independent Dressing Assistance: Limited assistance     Functional Limitations Info  Sight, Hearing, Speech Sight Info: Impaired Hearing Info: Impaired Speech Info: Adequate    SPECIAL CARE FACTORS FREQUENCY  PT (By licensed PT), OT (By licensed OT)     PT Frequency: 5 OT Frequency: 5            Contractures Contractures Info: Not present    Additional Factors Info  Code Status, Allergies, Psychotropic Code Status Info: Full Code Allergies Info: Crestor Psychotropic Info: Paxil (AMB)         Current Medications (06/02/2016):  This is the current hospital active medication list Current Facility-Administered Medications  Medication Dose Route Frequency Provider Last Rate Last Dose  . 0.9 %  sodium chloride infusion   Intravenous Continuous Eduard ClosArshad N Kakrakandy, MD 20 mL/hr at 05/31/16 0600    . acetaminophen (TYLENOL) tablet 650 mg  650 mg Oral Q6H PRN Eduard ClosArshad N Kakrakandy, MD   650 mg at 05/31/16 1646   Or  . acetaminophen (TYLENOL) suppository 650 mg  650 mg Rectal Q6H PRN Eduard ClosArshad N Kakrakandy, MD      .  digoxin (LANOXIN) tablet 125 mcg  125 mcg Oral Daily Eduard ClosArshad N Kakrakandy, MD   125 mcg at 06/02/16 0802  . Glycerin (Adult) 2.1 g suppository 1 suppository  1 suppository Rectal Daily PRN Leatha Gildingostin M Gherghe, MD   1 suppository at 06/02/16 1025  . hydrALAZINE (APRESOLINE) injection 5 mg  5 mg Intravenous Q4H PRN Eduard ClosArshad N Kakrakandy, MD      . HYDROmorphone (DILAUDID) injection 0.5 mg  0.5 mg Intravenous Q2H PRN Eduard ClosArshad N Kakrakandy, MD   0.5 mg at 06/01/16 2312  . ondansetron (ZOFRAN) tablet 4 mg  4 mg Oral Q6H PRN  Eduard ClosArshad N Kakrakandy, MD   4 mg at 06/01/16 2306   Or  . ondansetron (ZOFRAN) injection 4 mg  4 mg Intravenous Q6H PRN Eduard ClosArshad N Kakrakandy, MD   4 mg at 06/02/16 0651  . oxyCODONE-acetaminophen (PERCOCET/ROXICET) 5-325 MG per tablet 1 tablet  1 tablet Oral Q4H PRN Donalee CitrinGary Cram, MD   1 tablet at 06/01/16 2306   And  . oxyCODONE (Oxy IR/ROXICODONE) immediate release tablet 5 mg  5 mg Oral Q4H PRN Donalee CitrinGary Cram, MD      . polyethylene glycol (MIRALAX / GLYCOLAX) packet 17 g  17 g Oral Daily Leatha Gildingostin M Gherghe, MD   17 g at 06/02/16 0802  . promethazine (PHENERGAN) injection 6.25 mg  6.25 mg Intravenous Q6H PRN Leatha Gildingostin M Gherghe, MD   6.25 mg at 06/01/16 1432     Discharge Medications: Please see discharge summary for a list of discharge medications.  Relevant Imaging Results:  Relevant Lab Results:   Additional Information SS#  [id number]  Raye SorrowCoble, [place] Che[place] N, [place]
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
## 2016-06-03 DIAGNOSIS — I481 Persistent atrial fibrillation: Secondary | ICD-10-CM | POA: Diagnosis not present

## 2016-06-03 DIAGNOSIS — S32020S Wedge compression fracture of second lumbar vertebra, sequela: Secondary | ICD-10-CM | POA: Diagnosis not present

## 2016-06-03 DIAGNOSIS — S32022D Unstable burst fracture of second lumbar vertebra, subsequent encounter for fracture with routine healing: Secondary | ICD-10-CM | POA: Diagnosis not present

## 2016-06-03 MED ORDER — FLEET ENEMA 7-19 GM/118ML RE ENEM
1.0000 | ENEMA | Freq: Once | RECTAL | Status: DC
  Filled 2016-06-03: qty 1

## 2016-06-03 MED ORDER — POLYETHYLENE GLYCOL 3350 17 G PO PACK
34.0000 g | PACK | Freq: Two times a day (BID) | ORAL | Status: DC
  Administered 2016-06-03 – 2016-06-04 (×3): 34 g via ORAL
  Filled 2016-06-03 (×3): qty 2

## 2016-06-03 MED ORDER — BISACODYL 5 MG PO TBEC
10.0000 mg | DELAYED_RELEASE_TABLET | Freq: Once | ORAL | Status: AC
  Administered 2016-06-03: 10 mg via ORAL
  Filled 2016-06-03: qty 2

## 2016-06-03 MED ORDER — ASPIRIN EC 81 MG PO TBEC
81.0000 mg | DELAYED_RELEASE_TABLET | Freq: Every day | ORAL | Status: DC
  Administered 2016-06-03 – 2016-06-06 (×4): 81 mg via ORAL
  Filled 2016-06-03 (×4): qty 1

## 2016-06-03 MED ORDER — SENNOSIDES-DOCUSATE SODIUM 8.6-50 MG PO TABS
2.0000 | ORAL_TABLET | Freq: Two times a day (BID) | ORAL | Status: DC
  Administered 2016-06-03 – 2016-06-06 (×4): 2 via ORAL
  Filled 2016-06-03 (×4): qty 2

## 2016-06-03 MED ORDER — HEPARIN SODIUM (PORCINE) 5000 UNIT/ML IJ SOLN
5000.0000 [IU] | Freq: Two times a day (BID) | INTRAMUSCULAR | Status: DC
  Administered 2016-06-03 – 2016-06-06 (×7): 5000 [IU] via SUBCUTANEOUS
  Filled 2016-06-03 (×7): qty 1

## 2016-06-03 NOTE — Progress Notes (Signed)
Patient ID: [name] Weber, female   DOB: 12/19/1930, 82 y.o.   MRN: [id number] Stable seen by rehabilitation. Insurance to ok transfer

## 2016-06-03 NOTE — Progress Notes (Signed)
Rehab Admissions Coordinator Note:  Patient was screened by Trish MageLogue, Lindalou Soltis M for appropriateness for an Inpatient Acute Rehab Consult.  Noted PT recommending inpatient rehab consult.  At this time, we are recommending Inpatient Rehab consult.  Lelon FrohlichLogue, Kaydyn Chism M 06/03/2016, 10:27 AM  I can be reached at [phone].

## 2016-06-03 NOTE — Progress Notes (Signed)
I met with pt at bedside to discuss a possible inpt rehab admission pending insurance approval. I have ordered OT eval and requested eval for today so that I can begin insurance authorization. Pt previously admitted to CIR 12/15 and then went to [facility] to complete her rehab recovery. She states if insurance does not approve an inpt rehab admission, she will d/c home and not return to a SNF. [phone]
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
## 2016-06-04 ENCOUNTER — Ambulatory Visit: Payer: Medicare Other | Admitting: Physical Therapy

## 2016-06-04 DIAGNOSIS — S32020S Wedge compression fracture of second lumbar vertebra, sequela: Secondary | ICD-10-CM | POA: Diagnosis not present

## 2016-06-04 MED ORDER — MAGNESIUM CITRATE PO SOLN
0.5000 | Freq: Once | ORAL | Status: AC
  Administered 2016-06-04: 0.5 via ORAL
  Filled 2016-06-04: qty 296

## 2016-06-04 MED ORDER — SENNOSIDES-DOCUSATE SODIUM 8.6-50 MG PO TABS: 2.0000 | ORAL_TABLET | Freq: Two times a day (BID) | ORAL | Status: DC

## 2016-06-04 MED ORDER — POLYETHYLENE GLYCOL 3350 17 G PO PACK: 34.0000 g | PACK | Freq: Once | ORAL | Status: DC

## 2016-06-04 MED ORDER — MAGNESIUM HYDROXIDE 400 MG/5ML PO SUSP
960.0000 mL | Freq: Once | ORAL | Status: AC
  Administered 2016-06-04: 960 mL via RECTAL
  Filled 2016-06-04: qty 240
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
## 2016-06-04 NOTE — Progress Notes (Addendum)
[facility]. I spoke with pt and her friend, Via by phone and they are aware. They are requesting an appeal which I will initiate in the morning. Via also discussed that pt has had no BM since 6/30 therefore is not ready for d/c at this time. I have contacted both Dr. [name] and Dr. Randol KernElgergawy with this information. I will alert RN Cm of the appeal. Staff RN is aware of this information also. [phone]

## 2016-06-04 NOTE — Care Management Important Message (Signed)
Important Message  Patient Details  Name: [name] Weber MRN: [id number] Date of Birth: 02/25/1931   Medicare Important Message Given:  Yes    Bernadette HoitShoffner, Saysha Menta Coleman 06/04/2016, 9:03 AM

## 2016-06-04 NOTE — Progress Notes (Signed)
I await insurance determination for a possible inpt rehab admission today. [phone]
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
## 2016-06-05 ENCOUNTER — Encounter: Payer: Self-pay | Admitting: Physical Therapy

## 2016-06-05 DIAGNOSIS — E7800 Pure hypercholesterolemia, unspecified: Secondary | ICD-10-CM | POA: Diagnosis not present

## 2016-06-05 DIAGNOSIS — K5909 Other constipation: Secondary | ICD-10-CM | POA: Insufficient documentation

## 2016-06-05 DIAGNOSIS — K5900 Constipation, unspecified: Secondary | ICD-10-CM

## 2016-06-05 DIAGNOSIS — R030 Elevated blood-pressure reading, without diagnosis of hypertension: Secondary | ICD-10-CM | POA: Diagnosis not present

## 2016-06-05 DIAGNOSIS — S32020S Wedge compression fracture of second lumbar vertebra, sequela: Secondary | ICD-10-CM | POA: Diagnosis not present

## 2016-06-05 NOTE — Progress Notes (Signed)
Peer to peer has been completed with insurance for a possible inpt rehab admission. Denial is upheld. I spoke with Via, pt's caregiver, and she is aware of denial. I have provided [employer] with number for expedited appeal if they choose to appeal [phone]. I have contacted RN CM with this information for pt and family have a lot of questions concerning SNF, d/c and options for appeal. Please call me with any questions. [phone]
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
## 2016-06-05 NOTE — Care Management Note (Signed)
Case Management Note  Patient Details  Name: [name] Weber MRN: [id number] Date of Birth: 05/31/1931  Subjective/Objective:  82 yr old female admitted with L2-T8 compression Fracture.                  Action/Plan: Case manager has spoken with patient and her POA- Vi Appleton, concerning discharge plan. Case manager provided information concerning private duty agencies as well as [facility]. Patient wants to wait until final decision from CIR concerning insurance authorization.    Expected Discharge Date:   06/05/16               Expected Discharge Plan:     In-House Referral:     Discharge planning Services  CM Consult  Post Acute Care Choice:    Choice offered to:  [facility], [facility] PO[facility] / Guardian  DME Arranged:    DME Agency:     HH Arranged:    HH Agency:     Status of Service:  In process, will continue to follow  If discussed at Long Length of Stay Meetings, dates discussed:    Additional Comments:  Durenda GuthrieBrady, Tyresha Fede Naomi, RN 06/05/2016, 11:08 AM

## 2016-06-05 NOTE — Progress Notes (Signed)
I met with pt, as well as friends Via and [place] at bedside. We discussed that I was appealing the insurance denial by a peer to peer with the insurance company today. If denial upheld, other d/c options were discussed to include private pay for CIR, SNF rehab, home with [facility] or OP therapy and the need for 24/7 assist at home if d/c'd home. I then updated RN CM of the plan for today. I will follow up today with final insurance determination. [phone]

## 2016-06-05 NOTE — Care Management (Signed)
Case manager spoke with patient and Vi (POA), they are presently placing call for expedited appeal. Case Manager did let them know bed is available at [facility] for patient. CM has also provided information concerning procedure should patient's appeal be denied.  CM has notified Nicolasa DuckingSally Holland, CM AD of situation.

## 2016-06-05 NOTE — Clinical Social Work Note (Signed)
[facility]'s [facility], at this time, [facility] still does not have a decision on accepting the patient for STR; however, [facility] have [facility] (which was the family's first choice earlier in the week).  Bed confirmed today.  Of note, per medical staff, patient's family has filed a Medicare appeal and discharge is on hold.    PTAR form for transportation on the chart and PTAR will need to be called [phone] once Medicare appeal decision made.  [facility] can be reached at [phone]  Vickii PennaGina Azael Ragain, LCSW [phone]  [address] [facility] Licensed Clinical Social Worker

## 2016-06-05 NOTE — Progress Notes (Signed)
06/05/2016 9:18 AM  Care Manager informed me that patient to go to SNF and denied by insurance to go to CIR.  Discharge order written.  Updated discharge summary from 7/6.   [name] Weber. Graylee Arutyunyan, MD
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
## 2016-06-06 LAB — CULTURE, BLOOD (ROUTINE X 2)
Culture: NO GROWTH
Culture: NO GROWTH

## 2016-06-06 NOTE — Clinical Social Work Note (Signed)
Clinical Social Worker spoke with pt's POA to notify that doctor cleared pt for discharge today. Pt POA also notified that [facility] stated they have bed available and are willing for pt to discharge to SNF today. POA stated that she is willing to allow pt to discharge today to [facility]. CSW met with pt and informed her that she has been cleared for discharge today and that [facility] has been contacted to transport pt to [facility]. CSW also contacted [facility] and informed nurse of pt discharging and requested nurse contact SNF and give report.

## 2016-06-06 NOTE — Care Management (Signed)
Social worker has contacted patient's POA- Vi Appleton to discuss discharge to [facility]. Vi declined , stating they are waiting to hear from appeal.

## 2016-06-06 NOTE — Care Management Note (Signed)
Case Management Note  Patient Details  Name: Stephani Policevelyn C Messimer MRN: [id number] Date of Birth: 04/06/1931  Subjective/Objective:  Anticipate discharge to SNF today CM received call from [employer] who reports MD has faxed over Rxs that are not signed. This is confusing because this pt is being discharged to SNF today. Will continue to be available for additional CM needs.  Action/Plan:will sign off   Expected Discharge Date:                  Expected Discharge Plan:     In-House Referral:     Discharge planning Services  CM Consult  Post Acute Care Choice:    Choice offered to:  Patient, HC POA / Guardian  DME Arranged:    DME Agency:     HH Arranged:    HH Agency:     Status of Service:  In process, will continue to follow  If discussed at Long Length of Stay Meetings, dates discussed:    Additional Comments:  Yvone NeuCrutchfield, Amethyst Gainer M, RN 06/06/2016, 1:40 PM

## 2016-06-06 NOTE — Progress Notes (Signed)
06/06/2016 7:21 AM  PROGRESS  PT STABLE TO DISCHARGE TO SNF TODAY.  UPDATED DISCHARGE SUMMARY.   Standley Dakinslanford Jenniferann Stuckert MD

## 2016-06-06 NOTE — Progress Notes (Signed)
[facility].  Rolling walker and flower arrangement with patient's son.  VSS.  Patient without complaints of pain.

## 2016-06-08 ENCOUNTER — Encounter: Payer: Self-pay | Admitting: *Deleted

## 2016-06-08 ENCOUNTER — Inpatient Hospital Stay (HOSPITAL_COMMUNITY)
Admission: RE | Admit: 2016-06-08 | Discharge: 2016-06-19 | DRG: 561 | Disposition: A | Discharge status: Skilled Nursing Facility | Payer: Medicare Other | Source: Skilled Nursing Facility | Attending: Physical Medicine & Rehabilitation | Admitting: Physical Medicine & Rehabilitation

## 2016-06-08 ENCOUNTER — Other Ambulatory Visit (HOSPITAL_COMMUNITY): Payer: Self-pay | Admitting: Physician Assistant

## 2016-06-08 DIAGNOSIS — M21372 Foot drop, left foot: Secondary | ICD-10-CM | POA: Diagnosis not present

## 2016-06-08 DIAGNOSIS — K5900 Constipation, unspecified: Secondary | ICD-10-CM | POA: Diagnosis present

## 2016-06-08 DIAGNOSIS — M546 Pain in thoracic spine: Secondary | ICD-10-CM | POA: Insufficient documentation

## 2016-06-08 DIAGNOSIS — F419 Anxiety disorder, unspecified: Secondary | ICD-10-CM | POA: Diagnosis present

## 2016-06-08 DIAGNOSIS — R413 Other amnesia: Secondary | ICD-10-CM | POA: Diagnosis not present

## 2016-06-08 DIAGNOSIS — S32001S Stable burst fracture of unspecified lumbar vertebra, sequela: Secondary | ICD-10-CM

## 2016-06-08 DIAGNOSIS — R32 Unspecified urinary incontinence: Secondary | ICD-10-CM | POA: Diagnosis present

## 2016-06-08 DIAGNOSIS — F329 Major depressive disorder, single episode, unspecified: Secondary | ICD-10-CM | POA: Diagnosis present

## 2016-06-08 DIAGNOSIS — R159 Full incontinence of feces: Secondary | ICD-10-CM | POA: Diagnosis present

## 2016-06-08 DIAGNOSIS — M21371 Foot drop, right foot: Secondary | ICD-10-CM

## 2016-06-08 DIAGNOSIS — S32000S Wedge compression fracture of unspecified lumbar vertebra, sequela: Secondary | ICD-10-CM

## 2016-06-08 DIAGNOSIS — F32A Depression, unspecified: Secondary | ICD-10-CM | POA: Insufficient documentation

## 2016-06-08 DIAGNOSIS — S32000A Wedge compression fracture of unspecified lumbar vertebra, initial encounter for closed fracture: Secondary | ICD-10-CM | POA: Diagnosis present

## 2016-06-08 DIAGNOSIS — Z888 Allergy status to other drugs, medicaments and biological substances status: Secondary | ICD-10-CM | POA: Diagnosis not present

## 2016-06-08 DIAGNOSIS — K5903 Drug induced constipation: Secondary | ICD-10-CM | POA: Diagnosis not present

## 2016-06-08 DIAGNOSIS — R110 Nausea: Secondary | ICD-10-CM | POA: Diagnosis not present

## 2016-06-08 DIAGNOSIS — S32001A Stable burst fracture of unspecified lumbar vertebra, initial encounter for closed fracture: Secondary | ICD-10-CM | POA: Diagnosis present

## 2016-06-08 DIAGNOSIS — M21379 Foot drop, unspecified foot: Secondary | ICD-10-CM | POA: Diagnosis present

## 2016-06-08 DIAGNOSIS — I480 Paroxysmal atrial fibrillation: Secondary | ICD-10-CM | POA: Diagnosis present

## 2016-06-08 DIAGNOSIS — S32001D Stable burst fracture of unspecified lumbar vertebra, subsequent encounter for fracture with routine healing: Secondary | ICD-10-CM | POA: Diagnosis not present

## 2016-06-08 DIAGNOSIS — M4856XD Collapsed vertebra, not elsewhere classified, lumbar region, subsequent encounter for fracture with routine healing: Secondary | ICD-10-CM | POA: Diagnosis present

## 2016-06-08 DIAGNOSIS — I4891 Unspecified atrial fibrillation: Secondary | ICD-10-CM | POA: Insufficient documentation

## 2016-06-08 DIAGNOSIS — Z87891 Personal history of nicotine dependence: Secondary | ICD-10-CM

## 2016-06-08 HISTORY — DX: Stable burst fracture of unspecified lumbar vertebra, initial encounter for closed fracture: S32.001A

## 2016-06-08 HISTORY — DX: Wedge compression fracture of unspecified lumbar vertebra, initial encounter for closed fracture: S32.000A

## 2016-06-08 LAB — COMPREHENSIVE METABOLIC PANEL
ALT: 41 U/L (ref 14–54)
AST: 44 U/L — ABNORMAL HIGH (ref 15–41)
Albumin: 3 g/dL — ABNORMAL LOW (ref 3.5–5.0)
Alkaline Phosphatase: 72 U/L (ref 38–126)
Anion gap: 8 (ref 5–15)
BUN: 16 mg/dL (ref 6–20)
CO2: 26 mmol/L (ref 22–32)
Calcium: 9.1 mg/dL (ref 8.9–10.3)
Chloride: 100 mmol/L — ABNORMAL LOW (ref 101–111)
Creatinine, Ser: 0.67 mg/dL (ref 0.44–1.00)
GFR calc Af Amer: >60 mL/min (ref >60)
GFR calc non Af Amer: >60 mL/min (ref >60)
Glucose, Bld: 112 mg/dL — ABNORMAL HIGH (ref 65–99)
Potassium: 4 mmol/L (ref 3.5–5.1)
Sodium: 134 mmol/L — ABNORMAL LOW (ref 135–145)
Total Bilirubin: 0.6 mg/dL (ref 0.3–1.2)
Total Protein: 6.6 g/dL (ref 6.5–8.1)

## 2016-06-08 LAB — CBC WITH DIFFERENTIAL/PLATELET
Basophils Absolute: 0 10*3/uL (ref 0.0–0.1)
Basophils Relative: 0 %
Eosinophils Absolute: 0 10*3/uL (ref 0.0–0.7)
Eosinophils Relative: 1 %
HCT: 42.9 % (ref 36.0–46.0)
Hemoglobin: 14 g/dL (ref 12.0–15.0)
Lymphocytes Relative: 24 %
Lymphs Abs: 1.8 10*3/uL (ref 0.7–4.0)
MCH: 31.3 pg (ref 26.0–34.0)
MCHC: 32.6 g/dL (ref 30.0–36.0)
MCV: 95.8 fL (ref 78.0–100.0)
Monocytes Absolute: 0.6 10*3/uL (ref 0.1–1.0)
Monocytes Relative: 8 %
Neutro Abs: 5.1 10*3/uL (ref 1.7–7.7)
Neutrophils Relative %: 67 %
Platelets: 221 10*3/uL (ref 150–400)
RBC: 4.48 MIL/uL (ref 3.87–5.11)
RDW: 12.9 % (ref 11.5–15.5)
WBC: 7.6 10*3/uL (ref 4.0–10.5)

## 2016-06-08 MED ORDER — ACETAMINOPHEN 650 MG RE SUPP: 650.0000 mg | Freq: Four times a day (QID) | RECTAL | Status: DC | PRN

## 2016-06-08 MED ORDER — ACETAMINOPHEN 325 MG PO TABS: 325.0000 mg | ORAL_TABLET | ORAL | Status: DC | PRN

## 2016-06-08 MED ORDER — SORBITOL 70 % SOLN: 30.0000 mL | Freq: Every day | Status: DC | PRN

## 2016-06-08 MED ORDER — ONDANSETRON HCL 4 MG PO TABS
4.0000 mg | ORAL_TABLET | Freq: Four times a day (QID) | ORAL | Status: DC | PRN
  Administered 2016-06-11 – 2016-06-16 (×3): 4 mg via ORAL
  Filled 2016-06-08 (×3): qty 1

## 2016-06-08 MED ORDER — ACETAMINOPHEN 325 MG PO TABS: 650.0000 mg | ORAL_TABLET | Freq: Four times a day (QID) | ORAL | Status: DC | PRN

## 2016-06-08 MED ORDER — HYDROCODONE-ACETAMINOPHEN 5-325 MG PO TABS
1.0000 | ORAL_TABLET | Freq: Four times a day (QID) | ORAL | Status: DC | PRN
  Administered 2016-06-09 (×2): 1 via ORAL
  Administered 2016-06-10 – 2016-06-13 (×5): 2 via ORAL
  Filled 2016-06-08: qty 2
  Filled 2016-06-08: qty 1
  Filled 2016-06-08: qty 2
  Filled 2016-06-08: qty 1
  Filled 2016-06-08 (×3): qty 2

## 2016-06-08 MED ORDER — DIGOXIN 125 MCG PO TABS
0.1250 mg | ORAL_TABLET | Freq: Every day | ORAL | Status: DC
Start: 2016-06-08 — End: 2016-06-19
  Administered 2016-06-09 – 2016-06-19 (×11): 0.125 mg via ORAL
  Filled 2016-06-08 (×11): qty 1

## 2016-06-08 MED ORDER — ONDANSETRON HCL 4 MG/2ML IJ SOLN: 4.0000 mg | Freq: Four times a day (QID) | INTRAMUSCULAR | Status: DC | PRN

## 2016-06-08 MED ORDER — SENNOSIDES-DOCUSATE SODIUM 8.6-50 MG PO TABS
2.0000 | ORAL_TABLET | Freq: Two times a day (BID) | ORAL | Status: DC
  Administered 2016-06-08: 2 via ORAL
  Filled 2016-06-08 (×3): qty 2

## 2016-06-08 MED ORDER — SENNOSIDES-DOCUSATE SODIUM 8.6-50 MG PO TABS: 2.0000 | ORAL_TABLET | Freq: Two times a day (BID) | ORAL | Status: DC

## 2016-06-08 MED ORDER — OXYCODONE-ACETAMINOPHEN 5-325 MG PO TABS
1.0000 | ORAL_TABLET | ORAL | Status: DC | PRN
  Administered 2016-06-09 – 2016-06-10 (×3): 1 via ORAL
  Filled 2016-06-08 (×4): qty 1

## 2016-06-08 MED ORDER — HEPARIN SODIUM (PORCINE) 5000 UNIT/ML IJ SOLN: 5000.0000 [IU] | Freq: Two times a day (BID) | INTRAMUSCULAR | Status: DC

## 2016-06-08 MED ORDER — ONDANSETRON HCL 4 MG PO TABS: 4.0000 mg | ORAL_TABLET | Freq: Four times a day (QID) | ORAL | Status: DC | PRN

## 2016-06-08 MED ORDER — PAROXETINE HCL 10 MG PO TABS
10.0000 mg | ORAL_TABLET | Freq: Every day | ORAL | Status: DC
  Administered 2016-06-08 – 2016-06-11 (×4): 10 mg via ORAL
  Filled 2016-06-08 (×4): qty 1

## 2016-06-08 MED ORDER — OXYCODONE HCL 5 MG PO TABS
5.0000 mg | ORAL_TABLET | ORAL | Status: DC | PRN
  Administered 2016-06-09 – 2016-06-10 (×3): 5 mg via ORAL
  Filled 2016-06-08 (×3): qty 1

## 2016-06-08 MED ORDER — DIGOXIN 125 MCG PO TABS: 125.0000 ug | ORAL_TABLET | Freq: Every day | ORAL | Status: DC

## 2016-06-08 MED ORDER — ASPIRIN EC 81 MG PO TBEC
81.0000 mg | DELAYED_RELEASE_TABLET | Freq: Every day | ORAL | Status: DC
  Administered 2016-06-09 – 2016-06-19 (×11): 81 mg via ORAL
  Filled 2016-06-08 (×11): qty 1

## 2016-06-08 MED ORDER — ASPIRIN 81 MG PO CHEW: 81.0000 mg | CHEWABLE_TABLET | Freq: Every day | ORAL | Status: DC

## 2016-06-08 MED ORDER — HEPARIN SODIUM (PORCINE) 5000 UNIT/ML IJ SOLN
5000.0000 [IU] | Freq: Three times a day (TID) | INTRAMUSCULAR | Status: DC
  Administered 2016-06-08 – 2016-06-18 (×28): 5000 [IU] via SUBCUTANEOUS
  Filled 2016-06-08 (×29): qty 1

## 2016-06-08 MED ORDER — POLYETHYLENE GLYCOL 3350 17 G PO PACK
34.0000 g | PACK | Freq: Two times a day (BID) | ORAL | Status: AC
  Administered 2016-06-08: 34 g via ORAL
  Filled 2016-06-08: qty 2
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
## 2016-06-09 ENCOUNTER — Inpatient Hospital Stay (HOSPITAL_COMMUNITY): Payer: Medicare Other | Admitting: Occupational Therapy

## 2016-06-09 ENCOUNTER — Encounter (HOSPITAL_COMMUNITY): Payer: Self-pay

## 2016-06-09 ENCOUNTER — Ambulatory Visit: Payer: Medicare Other | Admitting: Physical Therapy

## 2016-06-09 ENCOUNTER — Inpatient Hospital Stay (HOSPITAL_COMMUNITY): Payer: Medicare Other | Admitting: Physical Therapy

## 2016-06-09 LAB — MRSA PCR SCREENING: MRSA by PCR: NEGATIVE

## 2016-06-09 MED ORDER — ALPRAZOLAM 0.25 MG PO TABS
0.2500 mg | ORAL_TABLET | Freq: Two times a day (BID) | ORAL | Status: DC | PRN
  Administered 2016-06-09: 0.25 mg via ORAL
  Filled 2016-06-09: qty 1

## 2016-06-09 MED ORDER — METHOCARBAMOL 500 MG PO TABS: 500.0000 mg | ORAL_TABLET | Freq: Four times a day (QID) | ORAL | Status: DC | PRN
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
## 2016-06-09 NOTE — Progress Notes (Signed)
Nursing reports high anxiety levels with patient asking for some "ativan" to help relax. Will order low dose xanax prn for now--may need paxil dose adjusted if levels of anxiety continues to be an issue.

## 2016-06-09 NOTE — Progress Notes (Signed)
Physical Therapy Session Note  Patient Details  Name: [name] Weber MRN: [id number] Date of Birth: 04/01/1931  Today's Date: 06/09/2016 PT Individual Time: [id number] PT Individual Time Calculation (min): 23 min   Short Term Goals: Week 1:  PT Short Term Goal 1 (Week 1): Perform sit to and from stand transfer with least restrctive assisitve device close supervision  PT Short Term Goal 2 (Week 1): Amubulate with least restrctive assisitve device close supervision 125 feet  PT Short Term Goal 3 (Week 1): Don/Doff TLSO in sitting min assist  PT Short Term Goal 4 (Week 1): Perfom bed mobility without use of bedrails close supervision  PT Short Term Goal 5 (Week 1): Negotiate 8 steps with left handrail min assist   Skilled Therapeutic Interventions/Progress Updates:    Pt received in bed, noting 8/10 back & R side pain but agreeable to PT. Pt able to transfer supine>sit via log rolling with supervision & use of bed features. While sitting EOB PT provided total A for donning socks, shoes, AFO & TLSO for time management. Throughout session pt performed sit<>Stand transfers with min A with maximum cuing to square up to chair, reach back with BUE & for improved eccentric control with sitting. Gait training x 55 ft + 70 ft with RW & Min A; pt demonstrates absent heel strike. Pt returned to room & left in straight back chair with w/c cushion in seat. Pt denied further treatment 2/2 pain; pt left with all needs within reach & set up with meal tray.   Therapy Documentation Precautions:  Precautions Precautions: Back, Fall Precaution Comments: pt able to recall 2/3 back precautions. Reviewed back precautions with pt and required cues to recall do not arch Required Braces or Orthoses: Other Brace/Splint Spinal Brace: Thoracolumbosacral orthotic Restrictions Weight Bearing Restrictions: No   General: PT Amount of Missed Time (min): 7 Minutes PT Missed Treatment Reason: Pain  Pain: Pain  Assessment Pain Assessment: 0-10 Pain Score: 8  Pain Location: Back (& right side) Pain Intervention(s): RN made aware   See Function Navigator for Current Functional Status.   Therapy/Group: Individual Therapy  Sandi MariscalVictoria M Scottlynn Lindell 06/09/2016, 5:01 PM

## 2016-06-09 NOTE — Progress Notes (Signed)
Orthopedic Tech Progress Note Patient Details:  [name] Weber 12/31/1930 [id number]  Patient ID: [name] Weber, female   DOB: 11/12/1931, 82 y.o.   MRN: [id number]   [name] FordyceJennifer C Aizah Gehlhausen 06/09/2016, 8:13 AMCalled [facility] for TLSO.

## 2016-06-09 NOTE — Progress Notes (Signed)
Inpatient Rehabilitation Center Individual Statement of Services  Patient Name:  [name] Weber  Date:  06/09/2016  Welcome to the Inpatient Rehabilitation Center.  Our goal is to provide you with an individualized program based on your diagnosis and situation, designed to meet your specific needs.  With this comprehensive rehabilitation program, you will be expected to participate in at least 3 hours of rehabilitation therapies Monday-Friday, with modified therapy programming on the weekends.  Your rehabilitation program will include the following services:  Physical Therapy (PT), Occupational Therapy (OT), 24 hour per day rehabilitation nursing, Case Management (Social Worker), Rehabilitation Medicine, Nutrition Services and Pharmacy Services  Weekly team conferences will be held on Tuesdays to discuss your progress.  Your Social Worker will talk with you frequently to get your input and to update you on team discussions.  Team conferences with you and your family in attendance may also be held.  Expected length of stay: 10 to 14 days  Overall anticipated outcome: modified independent with supervision for car transfers, community ambulation, stairs, bathing/showering, and toileting  Depending on your progress and recovery, your program may change. Your Social Worker will coordinate services and will keep you informed of any changes. Your Social Worker's name and contact numbers are listed  below.  The following services may also be recommended but are not provided by the Inpatient Rehabilitation Center:   Driving Evaluations  Home Health Rehabiltiation Services  Outpatient Rehabilitation Services   Arrangements will be made to provide these services after discharge if needed.  Arrangements include referral to agencies that provide these services.  Your insurance has been verified to be:  [employer] Your primary doctor is:  Dr. Marden Nobleobert [name]  Pertinent information will  be shared with your doctor and your insurance company.  Social Worker:  Staci AcostaJenny Kila Godina, LCSW  [phone] or [phone]) [phone]  Information discussed with and copy given to patient by: Elvera LennoxPrevatt, [place] Qazi Capps, 06/09/2016, 2:10 PM
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
## 2016-06-09 NOTE — Progress Notes (Signed)
Patient information reviewed and entered into eRehab system by Cheryl Stabenow, RN, CRRN, PPS Coordinator.  Information including medical coding and functional independence measure will be reviewed and updated through discharge.     Per nursing patient was given "Data Collection Information Summary for Patients in Inpatient Rehabilitation Facilities with attached "Privacy Act Statement-Health Care Records" upon admission.  

## 2016-06-10 ENCOUNTER — Inpatient Hospital Stay (HOSPITAL_COMMUNITY): Payer: Medicare Other | Admitting: Physical Therapy

## 2016-06-10 ENCOUNTER — Inpatient Hospital Stay (HOSPITAL_COMMUNITY): Payer: Medicare Other | Admitting: Occupational Therapy

## 2016-06-10 MED ORDER — SENNOSIDES-DOCUSATE SODIUM 8.6-50 MG PO TABS
1.0000 | ORAL_TABLET | Freq: Two times a day (BID) | ORAL | Status: DC
  Administered 2016-06-10 – 2016-06-11 (×3): 1 via ORAL
  Filled 2016-06-10 (×3): qty 1

## 2016-06-10 NOTE — Progress Notes (Signed)
Physical Therapy Session Note  Patient Details  Name: [name] Weber MRN: [id number] Date of Birth: 1931-03-24  Today's Date: 06/10/2016 PT Individual Time: 1400-1515 PT Individual Time Calculation (min): 75 min   Short Term Goals: Week 1:  PT Short Term Goal 1 (Week 1): Perform sit to and from stand transfer with least restrctive assisitve device close supervision  PT Short Term Goal 2 (Week 1): Amubulate with least restrctive assisitve device close supervision 125 feet  PT Short Term Goal 3 (Week 1): Don/Doff TLSO in sitting min assist  PT Short Term Goal 4 (Week 1): Perfom bed mobility without use of bedrails close supervision  PT Short Term Goal 5 (Week 1): Negotiate 8 steps with left handrail min assist   Skilled Therapeutic Interventions/Progress Updates:    Pt presented in bed stating mild fatigue but agreeable to participate.  Pt performed supine to sit with supervision maintaining spinal precautions.  Provided ModA for donning TLSO with pt demonstrating improving understanding of donning and adjusting.  Pt ambulated 141f x1 with no rest breaks with no significant exertion noted.  Participated in standing balance activities with single UE support reaching high/low and across midline,She also performed toe taps on 4in step with double UE support and cues for maintaining erect posture however no LOB noted. Pt able to tolerate standing for periods up to 5 minutes before noting fatigue and increased pain in back.  Practiced sit to stand transfers x 8 from mat and w/c with cues for decreasing use of LE bracing against mat/w/c with improved results.  Performed standing therex incl SLR, hip flexion, hip abd, x15 bilaterally. Pt ascend/decend x4 steps with 2 rails and step to pattern with CGA, however pt with noted fatigue after stairs.  Pt able to ambulate 1556f 504fith seated rest.  Pt was returned to her room and returned to bed with same assistance as above with call bell within reach and  all needs met.   Therapy Documentation Precautions:  Precautions Precautions: Back, Fall Precaution Booklet Issued: Yes (comment) Precaution Comments: Pt able to recall 1/3 back precautions with skilled OT. Pt was provided education on arching and twisting with use of recall strategy  Required Braces or Orthoses: Other Brace/Splint Spinal Brace: Thoracolumbosacral orthotic Restrictions Weight Bearing Restrictions: No General:   Vital Signs: Therapy Vitals Temp: 98.4 F (36.9 C) Temp Source: Oral Pulse Rate: 97 Resp: 18 BP: (!) 148/75 mmHg Patient Position (if appropriate): Lying Oxygen Therapy SpO2: 95 % O2 Device: Not Delivered     Other Treatments:     See Function Navigator for Current Functional Status.   Therapy/Group: Individual Therapy  Rozanne Heumann  Sevin Langenbach, PTA  06/10/2016, 3:52 PM

## 2016-06-10 NOTE — Progress Notes (Signed)
Physical Therapy Session Note  Patient Details  Name: [name] Weber MRN: [id number] Date of Birth: 12/12/1930  Today's Date: 06/10/2016 PT Individual Time: 0800-0900 PT Individual Time Calculation (min): 60 min   Short Term Goals: Week 1:  PT Short Term Goal 1 (Week 1): Perform sit to and from stand transfer with least restrctive assisitve device close supervision  PT Short Term Goal 2 (Week 1): Amubulate with least restrctive assisitve device close supervision 125 feet  PT Short Term Goal 3 (Week 1): Don/Doff TLSO in sitting min assist  PT Short Term Goal 4 (Week 1): Perfom bed mobility without use of bedrails close supervision  PT Short Term Goal 5 (Week 1): Negotiate 8 steps with left handrail min assist   Skilled Therapeutic Interventions/Progress Updates:    Pt presented in bed, agreeable to therapy. Pt able to recall 2/3 precautions, able to recall 3/3 at end of session.  Pt performed supine to sit with log rolling technique HOB elevated and use of bed rails. PTA provided mod assist for donning TLSO and providing instruction for donning, total assist for donning AFO's for time management. Pt ambulated to sink to brush hair with one hand steadying on sink. After brief seated rest pt ambulated 16000ft with RW and step through pattern.  Decreased heel strike noted with narrowing BOS noted with fatigue.  After seated rest pt able to continue add'l 8260ft.  Pt performed NuStep L1 x5 min for increasing endurance and cardiovascular activity.  Pt performed standing balance activities with standing tolerance limited to 2min before requiring seated rest.  Pt then with c/o increased dizziness and nausea, BP checked 116/71.  Pt ambulated with RW another 7280ft then pushed in w/c 2/2 fatigue.  Pt left in room in w/c with call bell within reach.    Therapy Documentation Precautions:  Precautions Precautions: Back, Fall Precaution Booklet Issued: Yes (comment) Precaution Comments: Pt able to recall 1/3  back precautions with skilled OT. Pt was provided education on arching and twisting with use of recall strategy  Required Braces or Orthoses: Other Brace/Splint Spinal Brace: Thoracolumbosacral orthotic Restrictions Weight Bearing Restrictions: No General:   Vital Signs:   Pain: Pain Assessment Pain Assessment: 0-10 Pain Score: 5  Pain Location: Back Pain Orientation: Mid Pain Descriptors / Indicators: Aching Pain Frequency: Constant Pain Onset: On-going Patients Stated Pain Goal: 3 Pain Intervention(s): Medication (See eMAR) Mobility:     See Function Navigator for Current Functional Status.   Therapy/Group: Individual Therapy  [name] Weber  Mia Milan, PTA  06/10/2016, 11:50 AM

## 2016-06-10 NOTE — Progress Notes (Signed)
Occupational Therapy Session Note  Patient Details  Name: [name] Weber MRN: [id number] Date of Birth: [birth year]  Today's Date: 06/10/2016 OT Individual Time: 3149-7026 OT Individual Time Calculation (min): 75 min    Short Term Goals: Week 1:  OT Short Term Goal 1 (Week 1): Pt will recall 3/3 back precautions  OT Short Term Goal 2 (Week 1): Pt will complete LB dressing with Min A OT Short Term Goal 3 (Week 1): Pt will complete bathing with AE and Min A   Skilled Therapeutic Interventions/Progress Updates:    Pt seen for skilled OT session focusing on self care. Pt sitting in w/c and tearful due to pain but agreeable to tx session upon arrival. RN administered pain medication prior to session. Pt ambulated using RW from bathroom door to shower chair needing min A due to balance. OT doffed shoes, AFOs sitting on shower chair and pt stood using RW to pull pants down. OT doffed TLSO and educated pt on back precautions. Pt able to recall 2/3 back precautions. Pt completed showering with supervision and VC to cross legs to wash LE or use long handled sponge. Due to "tightness" pt used long handled sponge.  Sitting on shower chair pt donned shirt with setup and OT donned TLSO educating pt on the importance of learning how to do independently. Pt transferred to w/c via stand pivot with min A from therapist. Pt completed LB dressing sitting in w/c trialing AE for demonstration of use. Pt stood to pull pants up with min A for balance. Pt initiated donning AFO and shoes using shoe horn, but required assist from OT.  Pt completed grooming activities with setup from sink. Pt transferred to bed using RW and min A. Pt rolled using bed rails for OT to doff TLSO.  Pt left supine in bed with all needs met.   Therapy Documentation Precautions:  Precautions Precautions: Back, Fall Precaution Booklet Issued: Yes (comment) Precaution Comments: Pt able to recall 1/3 back precautions with skilled OT. Pt was  provided education on arching and twisting with use of recall strategy  Required Braces or Orthoses: Other Brace/Splint Spinal Brace: Thoracolumbosacral orthotic Restrictions Weight Bearing Restrictions: No    Pain: Pain Assessment Pain Assessment: 0-10 Pain Score: 5  Pain Location: Back Pain Orientation: Mid Pain Descriptors / Indicators: Aching Pain Frequency: Constant Pain Onset: On-going Patients Stated Pain Goal: 3 Pain Intervention(s): Medication (See eMAR)  See Function Navigator for Current Functional Status.   Therapy/Group: Individual Therapy  Matilde Bash 06/10/2016, 12:14 PM
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
## 2016-06-11 ENCOUNTER — Inpatient Hospital Stay (HOSPITAL_COMMUNITY): Payer: Medicare Other | Admitting: Occupational Therapy

## 2016-06-11 ENCOUNTER — Inpatient Hospital Stay (HOSPITAL_COMMUNITY): Payer: Medicare Other | Admitting: Physical Therapy

## 2016-06-11 ENCOUNTER — Ambulatory Visit: Payer: Medicare Other | Admitting: Physical Therapy

## 2016-06-11 ENCOUNTER — Encounter (HOSPITAL_COMMUNITY): Payer: Medicare Other

## 2016-06-11 ENCOUNTER — Inpatient Hospital Stay (HOSPITAL_COMMUNITY): Payer: Medicare Other

## 2016-06-11 DIAGNOSIS — R413 Other amnesia: Secondary | ICD-10-CM

## 2016-06-11 MED ORDER — TRAMADOL HCL 50 MG PO TABS
50.0000 mg | ORAL_TABLET | Freq: Four times a day (QID) | ORAL | Status: DC
  Administered 2016-06-11 – 2016-06-16 (×19): 50 mg via ORAL
  Filled 2016-06-11 (×19): qty 1

## 2016-06-11 NOTE — Progress Notes (Addendum)
Physical Therapy Note  Patient Details  Name: [name] Weber MRN: [id number] Date of Birth: 09/30/1931 Today's Date: 06/11/2016  1400-1530, 90 min individual tx Pain: 5/10 premedicated this AM; declined asking for med at start if session.  Pt wearing TLSO.  Therapeutic activity in room, ambulating to dresser to put away clean clothes, adhering to back precautions without cues. Gait on level tile and carpet x 200' with RW, close supervision.  Transitional movements in standing, to pull chair out from table, turn with RW and sit in armchair and scoot it up to table with VCs. Gait to/from dayroom with close supervision. Up/down (8) 3" high steps bil rails, step through when ascending, step -to when descending until cued, then step- through safely; supervision/min guard.  Pt attempted x 2, but had to sit to rest in chair at top of stairs after 2nd ascent. Pt reported that her steps to enter at home (7+ landing+ 7) are higher than low steps in gym; close supervision/ min guard . Up/down 6" high steps x 2,  bil rails, step through when ascending, step to when descending, with cues, supervision. Pt exhausted and requested getting into bed,  VCs, close supervision. Pt needed mod assist to doff TLSO. Supine exs: 10 x 2 cervical flexion, alternating reciprocal heel slides.  Pt had problems sequencing reciprocal movements. 10 x 1 each L/R hip abduction.  Pt left resting in bed with all needs within reach. Pt issued general stengthening ex hand out.  She needs VCs to stay on task and do them accurately, even with hand-out. Bed alarm set.   Yohann Curl 06/11/2016, 2:26 PM

## 2016-06-11 NOTE — Progress Notes (Signed)
Occupational Therapy Session Note  Patient Details  Name: [name] Weber MRN: [id number] Date of Birth: 01/29/1931  Today's Date: 06/11/2016 OT Individual Time: 1120-1200 OT Individual Time Calculation (min): 40 min    Short Term Goals: Week 1:  OT Short Term Goal 1 (Week 1): Pt will recall 3/3 back precautions  OT Short Term Goal 2 (Week 1): Pt will complete LB dressing with Min A OT Short Term Goal 3 (Week 1): Pt will complete bathing with AE and Min A   Skilled Therapeutic Interventions/Progress Updates:    Pt seen for OT session focusing on ADL re-training, sit <> stands, and functional standing balance. Pt in supine upon arrival, voicing feeling slightly better than during earlier tx session and willing to attempt therapy. She transferred to EOB and donned TLSO with cues for donning technique. She ambulated to w/c and completed grooming tasks mod I from w/c level at sink.  She self propelled w/c to therapy gym using B UEs/LEs. Completed multiple sit <> stands from elevated EOM focusing on proper technique and "nose over toes" for sit <> stand as pt initially pushing back of legs strongly again EOM when standing, however, with cues progressed to completing with supervision. She completed dynamic standing task at [facility], required to weight shift, reach into all planes, and cross midline with CGA and one UE support on RW. Rest breaks required btwn standing trials due to decreased activity tolerance. Ambulated back to room, [id number] with RW and CGA- close supervision. She washed hands standing at sink with supervision, and left sitting in w/c at end of session, awaiting lunch with all needs in reach.  Therapy Documentation Precautions:  Precautions Precautions: Back, Fall Precaution Booklet Issued: Yes (comment) Precaution Comments: Pt able to recall 1/3 back precautions with skilled OT. Pt was provided education on arching and twisting with use of recall strategy  Required Braces or Orthoses:  Other Brace/Splint Spinal Brace: Thoracolumbosacral orthotic Restrictions Weight Bearing Restrictions: No Pain: Pain Assessment Pain Assessment: 0-10 Pain Score: 6  Pain Location: Back Pain Intervention(s):  (premedicated)  See Function Navigator for Current Functional Status.   Therapy/Group: Individual Therapy  Lewis, Brittanie Dosanjh C 06/11/2016, 12:35 PM
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
## 2016-06-11 NOTE — Progress Notes (Signed)
Occupational Therapy Session Note  Patient Details  Name: Stephani Policevelyn C Matulich MRN: [id number] Date of Birth: 06/01/1931  Today's Date: 06/11/2016 OT Individual Time: [id number] OT Individual Time Calculation (min): 31 min  and Today's Date: 06/11/2016 OT Missed Time: 10 Minutes Missed Time Reason: Other (comment) (Nausea)   Short Term Goals: Week 1:  OT Short Term Goal 1 (Week 1): Pt will recall 3/3 back precautions  OT Short Term Goal 2 (Week 1): Pt will complete LB dressing with Min A OT Short Term Goal 3 (Week 1): Pt will complete bathing with AE and Min A   Skilled Therapeutic Interventions/Progress Updates:    Pt seen for OT session focusing on ADL re-training. Pt in supine upon arrival voicing having had a "rough night" due to being uncomfortable in bed, however, declined pain at this time. She transferred to EOB using hospital bed functions with increased time and VCs for technique. [facility] with [facility]. She dressed UB with set-up, and cues for proper donning technique of TLSO. Upon sitting EOB, pt with complaints of dizziness, BP 102/63. OT went to obtain TED hose and upon return pt stating "i feel like i'm going to pass out". Assisted with return to supine and BP 131/54 in supine. Rest break provided in supine with pt feeling no relief of symptoms with increased complaints of nausea. Pt positioned in supine for comfort and left resting, missing 10 minutes of skilled tx. RN made aware of pt's complaints. Emotional support and education provided to pt regarding pain, BP and medication management, with pt voicing understanding.   Therapy Documentation Precautions:  Precautions Precautions: Back, Fall Precaution Booklet Issued: Yes (comment) Precaution Comments: Pt able to recall 1/3 back precautions with skilled OT. Pt was provided education on arching and twisting with use of recall strategy  Required Braces or Orthoses: Other  Brace/Splint Spinal Brace: Thoracolumbosacral orthotic Restrictions Weight Bearing Restrictions: No Pain:   No/ denies pain  See Function Navigator for Current Functional Status.   Therapy/Group: Individual Therapy  Lewis, [place] Franson C 06/11/2016, 6:32 AM

## 2016-06-11 NOTE — Progress Notes (Signed)
Physical Therapy Session Note  Patient Details  Name: [name] Weber MRN: [id number] Date of Birth: 01/19/1931  Today's Date: 06/11/2016 PT Individual Time: 1005-1030 PT Individual Time Calculation (min): 25 min   Short Term Goals: Week 1:  PT Short Term Goal 1 (Week 1): Perform sit to and from stand transfer with least restrctive assisitve device close supervision  PT Short Term Goal 2 (Week 1): Amubulate with least restrctive assisitve device close supervision 125 feet  PT Short Term Goal 3 (Week 1): Don/Doff TLSO in sitting min assist  PT Short Term Goal 4 (Week 1): Perfom bed mobility without use of bedrails close supervision  PT Short Term Goal 5 (Week 1): Negotiate 8 steps with left handrail min assist   Skilled Therapeutic Interventions/Progress Updates:    Pt received in bed noting 6/10 pain (premedicated) & nausea. Informed RN of pt's c/o nausea. Pt agreeable to bed level exercises & performed the following BLE: straight leg raises, hip abduction, hip adduction, and short arc quads with 5 second hold. At end of session pt left in bed with all needs within reach.   Therapy Documentation Precautions:  Precautions Precautions: Back, Fall Precaution Booklet Issued: Yes (comment) Precaution Comments: Pt able to recall 1/3 back precautions with skilled OT. Pt was provided education on arching and twisting with use of recall strategy  Required Braces or Orthoses: Other Brace/Splint Spinal Brace: Thoracolumbosacral orthotic Restrictions Weight Bearing Restrictions: No  Pain: Pain Assessment Pain Assessment: 0-10 Pain Score: 6  Pain Location: Back Pain Intervention(s):  (premedicated)   See Function Navigator for Current Functional Status.   Therapy/Group: Individual Therapy  Sandi MariscalVictoria M Payson Evrard 06/11/2016, 10:42 AM

## 2016-06-12 ENCOUNTER — Inpatient Hospital Stay (HOSPITAL_COMMUNITY): Payer: Medicare Other | Admitting: Occupational Therapy

## 2016-06-12 ENCOUNTER — Inpatient Hospital Stay (HOSPITAL_COMMUNITY): Payer: Medicare Other | Admitting: *Deleted

## 2016-06-12 DIAGNOSIS — R413 Other amnesia: Secondary | ICD-10-CM | POA: Insufficient documentation

## 2016-06-12 MED ORDER — SENNOSIDES-DOCUSATE SODIUM 8.6-50 MG PO TABS
2.0000 | ORAL_TABLET | Freq: Two times a day (BID) | ORAL | Status: DC
  Administered 2016-06-12 – 2016-06-17 (×7): 2 via ORAL
  Filled 2016-06-12 (×11): qty 2

## 2016-06-12 NOTE — Progress Notes (Signed)
Occupational Therapy Session Note  Patient Details  Name: Stephani Policevelyn C Decoste MRN: [id number] Date of Birth: 12/04/1930  Today's Date: 06/12/2016 OT Individual Time: 1400-1500 OT Individual Time Calculation (min): 60 min    Short Term Goals: Week 1:  OT Short Term Goal 1 (Week 1): Pt will recall 3/3 back precautions  OT Short Term Goal 2 (Week 1): Pt will complete LB dressing with Min A OT Short Term Goal 3 (Week 1): Pt will complete bathing with AE and Min A   Skilled Therapeutic Interventions/Progress Updates:    Pt seen for OT ADL bathing/dressing session. Pt in supine upon arrival, voicing feeling better than this morning and agreeable to tx session. Functional transfers completed throughout session with close supervision- min A. She bathed seated on shower chair with supervision using LH sponge.  Extensive time and education provided regarding donning technique of AFOs and TLSO. Pt with poor carry over over TLSO donning techniques taught in AM session. With chair in front of pt to support foot, pt able to don B AFOs with increased time and rest breaks required. Pt donned TLSO throughout session, initially requiring max cuing and demonstration on first 3 trials, however, following repeated trials pt able to don with set-up. She requested return to bed at end of session, left in supine with all needs in reach.   Therapy Documentation Precautions:  Precautions Precautions: Back, Fall Precaution Booklet Issued: Yes (comment) Precaution Comments: Pt able to recall 1/3 back precautions with skilled OT. Pt was provided education on arching and twisting with use of recall strategy  Required Braces or Orthoses: Other Brace/Splint Spinal Brace: Thoracolumbosacral orthotic Restrictions Weight Bearing Restrictions: No Pain:   No/ denies pain  See Function Navigator for Current Functional Status.   Therapy/Group: Individual Therapy  Lewis, Keyontae Huckeby C 06/12/2016, 3:47 PM

## 2016-06-12 NOTE — Progress Notes (Signed)
Physical Therapy Session Note  Patient Details  Name: Stephani Policevelyn C Novack MRN: [id number] Date of Birth: 08/27/1931  Today's Date: 06/12/2016 PT Group Time: 1000-1115 PT Group Time Calculation (min): 75 min  Short Term Goals: Week 1:  PT Short Term Goal 1 (Week 1): Perform sit to and from stand transfer with least restrctive assisitve device close supervision  PT Short Term Goal 2 (Week 1): Amubulate with least restrctive assisitve device close supervision 125 feet  PT Short Term Goal 3 (Week 1): Don/Doff TLSO in sitting min assist  PT Short Term Goal 4 (Week 1): Perfom bed mobility without use of bedrails close supervision  PT Short Term Goal 5 (Week 1): Negotiate 8 steps with left handrail min assist       Therapy Documentation Precautions:  TLSO Spinal and FALL Precautions   Patient participated in stride right group   Short sit to and from supine in bed mod I with bed rail.   Patient required min assist to don and doff TLSO seated edge of bed.   Transfers: Sit to and from stand transfer with close supervision; verbal cues for hand placement and controlled descent.   Sit to and from stand transfer block practice 10x with RW close supervision   Ambulation: Patient ambulated 155 feet and 175 feet, and 200 feet with rolling walker close supervision and bilateral AFO.  Patient ambulated with a step through gait pattern. Verbal cues for increased step length. Patient demonstrates uneven step and stride length with a varying cadence. Verbal cues for upright posture and pacing.    Stair negotiation: Patient negotiated 12 steps with left handrail and min assist and bilateral AFO. Patient performed stairs with a step to pattern. Patient educated on proper sequence and technique and was able to return demonstration with minimal verbal cues.  Standing ther ex: Heel raises 30x  Mini squats 30x.  Patient performed side stepping 25 feetx2 with RW and bilateral AFO min assist emphasis  placed on hip abduction, upright posture and hip abduction.    Patient tolerated treatment well. TLSO donned throughout session. Vitals monitored and remained stable throughout session responding appropriately to activity. Patient tolerated session with rest breaks throughout. Patient returned to bed at end of session.Call bell within reach and patient educated not to be up without assistance. Patient verbalized understanding.    See Function Navigator for Current Functional Status.   Therapy/Group: Group Therapy  Merri RayWISHART,Rayon Mcchristian J 06/12/2016, 1:11 PM

## 2016-06-12 NOTE — Progress Notes (Signed)
Occupational Therapy Session Note  Patient Details  Name: [name] Weber MRN: [id number] Date of Birth: 08/17/1931  Today's Date: 06/12/2016 OT Individual Time: [id number] OT Individual Time Calculation (min): 60 min    Short Term Goals: Week 1:  OT Short Term Goal 1 (Week 1): Pt will recall 3/3 back precautions  OT Short Term Goal 2 (Week 1): Pt will complete LB dressing with Min A OT Short Term Goal 3 (Week 1): Pt will complete bathing with AE and Min A   Skilled Therapeutic Interventions/Progress Updates:    Pt seen for OT session focusing on OOB/ upright tolerance, functional mobility, and ADL re-training. Pt in supine upon arrival, voicing need for toileting task. Shoes/ AFOs donned total A for time. She ambulated with RW to bathroom with min A. Upon sitting on toilet pt voiced complaints of diaphoresis and nausea/ dizziness. BP 128/86 and temp 97.6. Pt provided with cold wet wash cloth for comfort. RN made aware and came to assess pt. Educated with RN regarding importance of proper hydration, adequate diet, and effects time without having a BM can have on her feeling.  Following episode on toilet, pt declined showering task and opted to return to w/c. Following rest break in w/c pt voiced feeling better and agreeable to proceed with session. She completed grooming in sitting at w/c mod I.  She dressed from w/c level with set-up for UB dressing, requiring VCs for proper donning technique of TLSO. Min A sit <> stand at RW to pull pants up with steadying assist required during dynamic standing task. Pt able to cross ankle over knee to thread pants, however, was unsuccessful donning shoes/AFO in this manner. Chair placed in front of pt and pt able to prop foot on chair and reach to tie shoes. She cont to require min A with donning AFOs.  Pt requested to stay sitting in w/c at end of session to eat breakfast, left with all needs in reach and NT present.   Therapy Documentation Precautions:   Precautions Precautions: Back, Fall Precaution Booklet Issued: Yes (comment) Precaution Comments: Pt able to recall 1/3 back precautions with skilled OT. Pt was provided education on arching and twisting with use of recall strategy  Required Braces or Orthoses: Other Brace/Splint Spinal Brace: Thoracolumbosacral orthotic Restrictions Weight Bearing Restrictions: No Pain: Pain Assessment Pain Score: 9  Pain Type: Chronic pain;Surgical pain Pain Location: Back Pain Descriptors / Indicators: Aching Pain Intervention(s): RN made aware;Ambulation/increased activity;Repositioned  See Function Navigator for Current Functional Status.   Therapy/Group: Individual Therapy  Lewis, Vasilios Ottaway C 06/12/2016, 7:05 AM
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
## 2016-06-13 ENCOUNTER — Inpatient Hospital Stay (HOSPITAL_COMMUNITY): Payer: Medicare Other | Admitting: Physical Therapy

## 2016-06-13 ENCOUNTER — Ambulatory Visit (HOSPITAL_COMMUNITY): Payer: Medicare Other | Admitting: Physical Therapy

## 2016-06-13 ENCOUNTER — Inpatient Hospital Stay (HOSPITAL_COMMUNITY): Payer: Medicare Other | Admitting: Occupational Therapy

## 2016-06-13 DIAGNOSIS — S32001D Stable burst fracture of unspecified lumbar vertebra, subsequent encounter for fracture with routine healing: Secondary | ICD-10-CM

## 2016-06-13 NOTE — Progress Notes (Signed)
Occupational Therapy Session Note  Patient Details  Name: [name] Weber MRN: [id number] Date of Birth: 05/13/1931  Today's Date: 06/13/2016 OT Individual Time: 1015-1130 OT Individual Time Calculation (min): 75 min    Short Term Goals: Week 1:  OT Short Term Goal 1 (Week 1): Pt will recall 3/3 back precautions  OT Short Term Goal 2 (Week 1): Pt will complete LB dressing with Min A OT Short Term Goal 3 (Week 1): Pt will complete bathing with AE and Min A  Week 2:     Skilled Therapeutic Interventions/Progress Updates:    1:1 Pt getting ready to go to the bathroom. Pt ambulated there with bilateral AFO and shoes on with steadying A. Pt required total A to don TLSO correctly. Showered remaining seated with lateral leans for peri area and bottom. Squat pivot out of shower to w/c (without AFOs). Discussed process of getting in and out of shower with TLSO donned and needing someone there to assist with setup possibly. Continued extensive problem solving and practice of donning TLSO even with written instructions she helped write- pt still required max VC to don correctly.  Discussed her neighbor coming over at bath time to assist with brace.  Performed grooming in sitting at sink mod I.   Therapy Documentation Precautions:  Precautions Precautions: Back, Fall Precaution Booklet Issued: Yes (comment) Precaution Comments: Pt able to recall 1/3 back precautions with skilled OT. Pt was provided education on arching and twisting with use of recall strategy  Required Braces or Orthoses: Other Brace/Splint Spinal Brace: Thoracolumbosacral orthotic Restrictions Weight Bearing Restrictions: No Pain:  no c/o pain in session   See Function Navigator for Current Functional Status.   Therapy/Group: Individual Therapy  Roney MansSmith, Emilo Gras [facility] 06/13/2016, 11:01 AM

## 2016-06-13 NOTE — Progress Notes (Signed)
Physical Therapy Session Note  Patient Details  Name: Stephani Policevelyn C Grima MRN: [id number] Date of Birth: 11/25/1931  Today's Date: 06/13/2016 PT Group Time: 1300-1400 PT Group Time Calculation (min): 60 min  Short Term Goals: Week 1:  PT Short Term Goal 1 (Week 1): Perform sit to and from stand transfer with least restrctive assisitve device close supervision  PT Short Term Goal 2 (Week 1): Amubulate with least restrctive assisitve device close supervision 125 feet  PT Short Term Goal 3 (Week 1): Don/Doff TLSO in sitting min assist  PT Short Term Goal 4 (Week 1): Perfom bed mobility without use of bedrails close supervision  PT Short Term Goal 5 (Week 1): Negotiate 8 steps with left handrail min assist   Skilled Therapeutic Interventions/Progress Updates:    Patient received sitting in WC and agreeable to PT.  Gait training for 1980ft x2, and 15650ft with close supervsion A from PT with mod cues for improved heel contact and increase step length. Patient demonstrated improved gait pattern 50% of the time following cues from PT  PT instructed patient in Gait on carpeted floor for 13300ft and weave around chairs and furniture to simulate gait within home. PT provided supervision-min A with increased distance and mod cues for improve knee extension and increased heel contact with each step.   Patient instructed by PT  In Gait training through obstacle course on carpeted floor including weave through 6 cones, up and down 4 inch curb x 2 and stepping over 4 canes. Min A provided by PT throughout obstacle course as well as mod cues for AD management and proper step-to gait pattern for up/down curb and over obstacles in floor.   Patient returned to room and left with call bell within reach.    Therapy Documentation Precautions:  Precautions Precautions: Back, Fall Precaution Booklet Issued: Yes (comment) Precaution Comments: Pt able to recall 1/3 back precautions with skilled OT. Pt was provided  education on arching and twisting with use of recall strategy  Required Braces or Orthoses: Other Brace/Splint Spinal Brace: Thoracolumbosacral orthotic Restrictions Weight Bearing Restrictions: No    Pain: 0/10    See Function Navigator for Current Functional Status.   Therapy/Group: Group Therapy  Golden Popustin E Nicasio Barlowe 06/13/2016, 6:25 PM

## 2016-06-13 NOTE — Progress Notes (Signed)
Physical Therapy Session Note  Patient Details  Name: Stephani Policevelyn C Skowronek MRN: [id number] Date of Birth: 08/03/1931  Today's Date: 06/13/2016 PT Individual Time: 0800-0900 PT Individual Time Calculation (min): 60 min   Short Term Goals: Week 1:  PT Short Term Goal 1 (Week 1): Perform sit to and from stand transfer with least restrctive assisitve device close supervision  PT Short Term Goal 2 (Week 1): Amubulate with least restrctive assisitve device close supervision 125 feet  PT Short Term Goal 3 (Week 1): Don/Doff TLSO in sitting min assist  PT Short Term Goal 4 (Week 1): Perfom bed mobility without use of bedrails close supervision  PT Short Term Goal 5 (Week 1): Negotiate 8 steps with left handrail min assist   Skilled Therapeutic Interventions/Progress Updates:  Pt was seen bedside in the am. Pt sitting on toilet. Pt performed sit to stand transfer with grab bar and min guard. Pt performed all sit to stand transfers with S to min guard and rolling walker with verbal cues and increased time. Pt ambulated 8, 10, 20 x2 and 150 feet x 2 with rolling walker and S to min guard with verbal cues. Pt propelled w/c 150 feet x 2 with B UEs and S. Pt ascended/descended 8 stairs with R rail and min A twice with verbal cues for safety. Pt performed alternating cone taps and cone taps, 3 sets x 10 reps each for LE strengthening and NMR. Pt returned to room. Pt transferred w/c to edge of edge with min guard and verbal cues. Pt transferred edge of bed to supine with S and increased time. Pt left sitting up in bed with call bell within reach.   Therapy Documentation Precautions:  Precautions Precautions: Back, Fall Precaution Booklet Issued: Yes (comment) Precaution Comments: Pt able to recall 1/3 back precautions with skilled OT. Pt was provided education on arching and twisting with use of recall strategy  Required Braces or Orthoses: Other Brace/Splint Spinal Brace: Thoracolumbosacral  orthotic Restrictions Weight Bearing Restrictions: No General:   Pain: Pt c/o 4/10 back pain.   See Function Navigator for Current Functional Status.   Therapy/Group: Individual Therapy  Rayford HalstedMitchell, Cris Gibby G 06/13/2016, 12:28 PM
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
## 2016-06-14 ENCOUNTER — Inpatient Hospital Stay (HOSPITAL_COMMUNITY): Payer: Medicare Other | Admitting: Physical Therapy

## 2016-06-14 NOTE — Progress Notes (Signed)
Refusing to get OOB at HS to go to BR, requesting bedpan. Continues to request after encouragement.Alfredo MartinezMurray, Kylieann Eagles A

## 2016-06-14 NOTE — Progress Notes (Signed)
Physical Therapy Session Note  Patient Details  Name: [name] Weber MRN: [id number] Date of Birth: 06/21/1931  Today's Date: 06/14/2016 PT Individual Time: 1300-1315 PT Individual Time Calculation (min): 15 min  PT Group Time 1315 - 1400 PT Group Time Calculation: 45 min  Short Term Goals: Week 1:  PT Short Term Goal 1 (Week 1): Perform sit to and from stand transfer with least restrctive assisitve device close supervision  PT Short Term Goal 2 (Week 1): Amubulate with least restrctive assisitve device close supervision 125 feet  PT Short Term Goal 3 (Week 1): Don/Doff TLSO in sitting min assist  PT Short Term Goal 4 (Week 1): Perfom bed mobility without use of bedrails close supervision  PT Short Term Goal 5 (Week 1): Negotiate 8 steps with left handrail min assist   Skilled Therapeutic Interventions/Progress Updates:  Pt was seen bedside in the pm. Pt transferred supine to edge of bed with side rail and S. Donned TLSO and B AFOs as edge of bed. Pt transferred sit to stand with rolling walker and S to min guard. Pt ambulated 200 feet with rolling walker and S to min guard with verbal cues. Pt participated in walking group. Pt ambulated through 50 feet slalom course with rolling walker and S To min guard x 3 with rest breaks. Pt ambulated back to room about 200 feet with rolling walker and S to min guard. Pt doffed TLSO and B AFOs at edge of bed. Pt transferred edge of bed to supine with S and verbal cues. Pt left sitting up in bed with call bell within reach.   Therapy Documentation Precautions:  Precautions Precautions: Back, Fall Precaution Booklet Issued: Yes (comment) Precaution Comments: Pt able to recall 1/3 back precautions with skilled OT. Pt was provided education on arching and twisting with use of recall strategy  Required Braces or Orthoses: Other Brace/Splint Spinal Brace: Thoracolumbosacral orthotic Restrictions Weight Bearing Restrictions: No General:   Pain: PT  c/o 4/10 back pain.  See Function Navigator for Current Functional Status.   Therapy/Group: Individual Therapy and Group Therapy  Rayford HalstedMitchell, Shamirah Ivan G 06/14/2016, 3:41 PM
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
## 2016-06-15 ENCOUNTER — Inpatient Hospital Stay (HOSPITAL_COMMUNITY): Payer: Medicare Other | Admitting: Occupational Therapy

## 2016-06-15 ENCOUNTER — Encounter: Payer: Medicare Other | Admitting: Physical Medicine & Rehabilitation

## 2016-06-15 ENCOUNTER — Inpatient Hospital Stay (HOSPITAL_COMMUNITY): Payer: Medicare Other | Admitting: Physical Therapy

## 2016-06-15 ENCOUNTER — Inpatient Hospital Stay (HOSPITAL_COMMUNITY): Payer: Medicare Other

## 2016-06-15 MED ORDER — METHOCARBAMOL 500 MG PO TABS
500.0000 mg | ORAL_TABLET | Freq: Four times a day (QID) | ORAL | Status: DC
  Administered 2016-06-15 – 2016-06-16 (×5): 500 mg via ORAL
  Filled 2016-06-15 (×5): qty 1

## 2016-06-15 NOTE — Progress Notes (Signed)
Physical Therapy Session Note  Patient Details  Name: Stephani Policevelyn C Higuchi MRN: [id number] Date of Birth: 05/06/1931  Today's Date: 06/15/2016 PT Individual Time: 0900-1000 PT Individual Time Calculation (min): 60 min   Short Term Goals: Week 1:  PT Short Term Goal 1 (Week 1): Perform sit to and from stand transfer with least restrctive assisitve device close supervision  PT Short Term Goal 2 (Week 1): Amubulate with least restrctive assisitve device close supervision 125 feet  PT Short Term Goal 3 (Week 1): Don/Doff TLSO in sitting min assist  PT Short Term Goal 4 (Week 1): Perfom bed mobility without use of bedrails close supervision  PT Short Term Goal 5 (Week 1): Negotiate 8 steps with left handrail min assist   Skilled Therapeutic Interventions/Progress Updates:   Session focused on functional gait training with RW (Bilateral AFO's and TLSO already donned) with cues for gait pattern and safe positioning of RW, d/c planning and discussion as well as education on potential outing this week for community reintegration, neuro re-ed for dynamic balance and coordination activities (including no UE support, 1 UE support and BUE support to throw/catch ball and kick with alternating BLE), dynamic gait through obstacle course to simulate community and home mobility navigating compliant surfaces, turns, stepping over threshold, and sidestepping with supervision to steadying assist with verbal cues for safer technique, stair negotiation with L rail x 12 steps and simulated car transfer with steadying assist and verbal cues for technique. Pt fatigued during session but able to complete with rest breaks as needed. Continues to demonstrate decreased safety with RW at times and cues for carryover/memory.     Therapy Documentation Precautions:  Precautions Precautions: Back, Fall Precaution Booklet Issued: Yes (comment) Precaution Comments: Pt able to recall 3/3 precautions Required Braces or Orthoses:  Other Brace/Splint Spinal Brace: Thoracolumbosacral orthotic, Other (comment), Applied in sitting position Other Brace/Splint: Bilateral AFOs Restrictions Weight Bearing Restrictions: No   Pain:  Reports pain in back - using alternating ice and heat.     See Function Navigator for Current Functional Status.   Therapy/Group: Individual Therapy  [name], Meghen Akopyan Darrol PokeBrescia  Hiba Garry B. Fiorela Pelzer, PT, DPT  06/15/2016, 10:59 AM

## 2016-06-15 NOTE — Progress Notes (Signed)
Physical Therapy Weekly Progress Note  Patient Details  Name: [name] Weber MRN: [id number] Date of Birth: 1930/12/30  Beginning of progress report period: June 08, 2016 End of progress report period: June 15, 2016  Today's Date: 06/15/2016 PT Individual Time: 9672-8979 PT Individual Time Calculation (min): 24 min   Patient has met 4 of 5 short term goals.    Patient continues to demonstrate the following deficits: cardiovascular endurance, strength, dynamic balance and therefore will continue to benefit from skilled PT intervention to enhance overall performance with activity tolerance, balance, knowledge of precautions and functional mobility.  Patient progressing toward long term goals.  Continue plan of care.  PT Short Term Goals Week 1:  PT Short Term Goal 1 (Week 1): Perform sit to and from stand transfer with least restrctive assisitve device close supervision  PT Short Term Goal 2 (Week 1): Amubulate with least restrctive assisitve device close supervision 125 feet  PT Short Term Goal 3 (Week 1): Don/Doff TLSO in sitting min assist  PT Short Term Goal 4 (Week 1): Perfom bed mobility without use of bedrails close supervision  PT Short Term Goal 5 (Week 1): Negotiate 8 steps with left handrail min assist   Skilled Therapeutic Interventions/Progress Updates:    Patient continues to demonstrate functional progress. Patient able to recall 3/3 spinal precautions during session. Patient demonstrates improvements in functional mobility and balance.   Therapy Documentation Precautions:  Precautions Precautions: Back, Fall Precaution Booklet Issued: Yes (comment) Precaution Comments: Pt able to recall 3/3 precautions Required Braces or Orthoses: Other Brace/Splint Spinal Brace: Thoracolumbosacral orthotic, Other (comment), Applied in sitting position Other Brace/Splint: Bilateral AFOs Restrictions Weight Bearing Restrictions: No Pain: Pain Assessment Pain Score: 0-No  pain  Sit to and from stand transfer supervision with RW  Stand pivot transfer supervision with RW  Patient performed stairs with BUE support on left handrail 12 steps x2 close supervision assist. Verbal cues for proper sequence and technique. Patient required standing rest break during stair negotiation prior to descent.   Patient doffed shoes and B AFO supervision.  Patient able to doff TLSO min assist.  Short sit to supine on bed supervision with use of bed rail.   See Function Navigator for Current Functional Status.  Therapy/Group: Individual Therapy  [name] 06/15/2016, 4:00 PM

## 2016-06-15 NOTE — Progress Notes (Signed)
Physical Therapy Session Note  Patient Details  Name: [name] Weber MRN: [id number] Date of Birth: 08/02/1931  Today's Date: 06/15/2016 PT Individual Time: 1400-1430 PT Individual Time Calculation (min): 30 min   Short Term Goals: Week 1:  PT Short Term Goal 1 (Week 1): Perform sit to and from stand transfer with least restrctive assisitve device close supervision  PT Short Term Goal 2 (Week 1): Amubulate with least restrctive assisitve device close supervision 125 feet  PT Short Term Goal 3 (Week 1): Don/Doff TLSO in sitting min assist  PT Short Term Goal 4 (Week 1): Perfom bed mobility without use of bedrails close supervision  PT Short Term Goal 5 (Week 1): Negotiate 8 steps with left handrail min assist   Skilled Therapeutic Interventions/Progress Updates:    session focused on functional bed mobility, donning TLSO (requiring min assist and verbal cues- suggested leaving one strap fastened to make it easier for her), transfers, and gait outside over uneven surfaces with RW with min assist and cues for safe positioning of RW. Discussed community outing planned for tomorrow for lunch outing and what to expect.   Therapy Documentation Precautions:  Precautions Precautions: Back, Fall Precaution Booklet Issued: Yes (comment) Precaution Comments: Pt able to recall 3/3 precautions Required Braces or Orthoses: Other Brace/Splint Spinal Brace: Thoracolumbosacral orthotic, Other (comment), Applied in sitting position Other Brace/Splint: Bilateral AFOs Restrictions Weight Bearing Restrictions: No  Pain: No complaints.    See Function Navigator for Current Functional Status.   Therapy/Group: Individual Therapy  [name], [name]  [name], PT, DPT  06/15/2016, 3:33 PM
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
## 2016-06-15 NOTE — Progress Notes (Signed)
Requesting bedpan, "back brace is too much trouble to put on."  Urine malodorous. Alfredo MartinezMurray, Nhung Danko A

## 2016-06-15 NOTE — Progress Notes (Signed)
Occupational Therapy Session Note  Patient Details  Name: [name] Weber MRN: [id number] Date of Birth: 10/06/1931  Today's Date: 06/15/2016 OT Individual Time: 5638-75640730-0830 and 1430-1500 OT Individual Time Calculation (min): 60 min and 30 min   Short Term Goals: Week 1:  OT Short Term Goal 1 (Week 1): Pt will recall 3/3 back precautions  OT Short Term Goal 2 (Week 1): Pt will complete LB dressing with Min A OT Short Term Goal 3 (Week 1): Pt will complete bathing with AE and Min A   Skilled Therapeutic Interventions/Progress Updates:    Session One: Pt seen for OT session focusing on ADL re-training and functional mobility. Pt in supine upon arrival, easily awoken and agreeable to tx session. She declined pain upon arrival. She declined full bathing session today, opting instead to just change clothes. She dressed seated EOB with set-up for UB dressing, requiring min questioning cues for proper donning technique of TLSO. Steadying assist required in standing with non-slid socks on, however, once shoes/ AFO donned pt able to maintain static/dynamic standing balance with supervision. She ambulated throughout unit to ADL apartment. She completed simple kitchen mobility task, removing and replacing items from overhead cabinet, using one UE for stabilizing support on counter. Following seated rest break, she accessed refrigerator, requiring verbal and tactile cuing for proper RW management. She completed rest break on low soft surface couch, requiring assist for controlled descent due to decreased descentric control. She ambulated back to room at end of session and left resting in recliner. She cont to decline pain, however, requested ice pack for lower back discomfort. Left with ice bag placed and all needs in reach.   Session Two: Pt seen for OT session focusing on functional ambulation, education, and functional standing balance/ endurance. Pt received sitting in w/c in therapy gym with hand off  from PT and agreeable to tx session.  She ambulated throughout unit using weighted grocery cart to simulate shopping task and addressing functional ambulation and endurance. She ambulated with CGA and VCs for safety awareness. She was then required to pick up items placed in hallway to simulate grocery shopping task, and educated regarding how to retrieve items from low surfaces while maintaining spinal precautions.  In therapy gym, pt complete horseshoe toss with emphasis on no UE support during dynamic standing task. She required use of RW to obtain balance, however, was able to maintain dynamic standing balance without UE support with CGA. She ambulated with RW to pick up horseshoes from floor using reacher.  Discussed with pt community outing planning for tomorrow. Pt with decreased insight into deficits/ potential community barriers during outing including transporting items while using RW and general community mobility.  She was educated throughout session regarding modified techniques and other options for IADL tasks such as grocery shopping at d/c.  She cont to demonstrate decreased carry over of education throughout sessions. Pt left sitting in gym at end of session with hand off to next discipline.   Therapy Documentation Precautions:  Precautions Precautions: Back, Fall Precaution Booklet Issued: Yes (comment) Precaution Comments: Pt able to recall 1/3 back precautions with skilled OT. Pt was provided education on arching and twisting with use of recall strategy  Required Braces or Orthoses: Other Brace/Splint Spinal Brace: Thoracolumbosacral orthotic Restrictions Weight Bearing Restrictions: No  See Function Navigator for Current Functional Status.   Therapy/Group: Individual Therapy  Lewis, Jalayla Chrismer C 06/15/2016, 7:09 AM

## 2016-06-15 NOTE — Discharge Instructions (Signed)
Inpatient Rehab Discharge Instructions  [name] Weber Discharge date and time: No discharge date for patient encounter.   Activities/Precautions/ Functional Status: Activity: TLSO brace and bilateral AFOs when out of bed Diet: regular diet Wound Care: none needed Functional status:  ___ No restrictions     ___ Walk up steps independently ___ 24/7 supervision/assistance   ___ Walk up steps with assistance ___ Intermittent supervision/assistance  ___ Bathe/dress independently ___ Walk with walker     _x__ Bathe/dress with assistance ___ Walk Independently    ___ Shower independently ___ Walk with assistance    ___ Shower with assistance ___ No alcohol     ___ Return to work/school ________  Special Instructions:    My questions have been answered and I understand these instructions. I will adhere to these goals and the provided educational materials after my discharge from the hospital.  Patient/Caregiver Signature _______________________________ Date __________  Clinician Signature _______________________________________ Date __________  Please bring this form and your medication list with you to all your follow-up doctor's appointments.

## 2016-06-16 ENCOUNTER — Inpatient Hospital Stay (HOSPITAL_COMMUNITY): Payer: Medicare Other | Admitting: Physical Therapy

## 2016-06-16 ENCOUNTER — Inpatient Hospital Stay (HOSPITAL_COMMUNITY): Payer: Medicare Other | Admitting: Occupational Therapy

## 2016-06-16 ENCOUNTER — Ambulatory Visit: Payer: Medicare Other | Admitting: Physical Therapy

## 2016-06-16 ENCOUNTER — Inpatient Hospital Stay (HOSPITAL_COMMUNITY): Payer: Medicare Other | Admitting: *Deleted

## 2016-06-16 MED ORDER — METHOCARBAMOL 500 MG PO TABS: 500.0000 mg | ORAL_TABLET | Freq: Four times a day (QID) | ORAL | Status: DC | PRN

## 2016-06-16 MED ORDER — PANTOPRAZOLE SODIUM 40 MG PO TBEC
40.0000 mg | DELAYED_RELEASE_TABLET | Freq: Every day | ORAL | Status: DC
  Administered 2016-06-16 – 2016-06-19 (×4): 40 mg via ORAL
  Filled 2016-06-16 (×4): qty 1

## 2016-06-16 MED ORDER — TRAMADOL HCL 50 MG PO TABS
50.0000 mg | ORAL_TABLET | Freq: Three times a day (TID) | ORAL | Status: DC
  Administered 2016-06-16: 50 mg via ORAL
  Filled 2016-06-16: qty 1

## 2016-06-16 MED ORDER — HYDROCODONE-ACETAMINOPHEN 5-325 MG PO TABS
1.0000 | ORAL_TABLET | Freq: Four times a day (QID) | ORAL | Status: DC | PRN
  Administered 2016-06-17: 1 via ORAL
  Filled 2016-06-16: qty 1

## 2016-06-16 NOTE — Progress Notes (Signed)
Physical Therapy Session Note  Patient Details  Name: [name] Weber MRN: [id number] Date of Birth: 09/23/1931  Today's Date: 06/16/2016 PT Individual Time: [id number] PT Individual Time Calculation (min): 28 min   Short Term Goals: Week 2:  PT Short Term Goal 1 (Week 2): STG=LTG based on estimated length of stay  Skilled Therapeutic Interventions/Progress Updates:    Pt received in bed & agreeable to PT, noting 3/10 back pain. Pt able to transfer supine>sitting EOB with supervision, bed rails & HOB elevated. Pt able to don TLSO while sitting EOB with supervision and cuing for straps. Throughout session pt completed all transfers with supervision/mod I. Gait training x 150 ft room>gym with RW & supervision/mod I with absent heel strike BLE. Stair training completed x 16 steps with L ascending rail & supervision/mod I; pt negotiated stairs laterally. Utilized nu-step on level 3 x 10 minutes with pt reporting 11-13 on Borg RPE scale; activity focused on cardiovascular endurance training. Gait training x 150 ft back to room with RW in same manner as noted above. Pt left in w/c with all needs within reach.   Therapy Documentation Precautions:  Precautions Precautions: Back, Fall Precaution Booklet Issued: Yes (comment) Precaution Comments: Pt able to recall 3/3 precautions Required Braces or Orthoses: Other Brace/Splint Spinal Brace: Thoracolumbosacral orthotic, Other (comment), Applied in sitting position Other Brace/Splint: Bilateral AFOs Restrictions Weight Bearing Restrictions: No  Pain: Pain Assessment Pain Assessment: 0-10 Pain Score: 3  Pain Location: Back Pain Intervention(s): Ambulation/increased activity   See Function Navigator for Current Functional Status.   Therapy/Group: Individual Therapy  Sandi MariscalVictoria M Fredrick [place]og[place] 06/16/2016, 5:12 PM

## 2016-06-16 NOTE — Plan of Care (Signed)
Problem: RH Bed to Chair Transfers Goal: LTG Patient will perform bed/chair transfers w/assist (PT) LTG: Patient will perform bed/chair transfers with assistance, with/without cues (PT).  Downgraded to supervision for cues and safety. ABG  Problem: RH Ambulation Goal: LTG Patient will ambulate in controlled environment (PT) LTG: Patient will ambulate in a controlled environment, # of feet with assistance (PT).  Downgraded to supervision for safety/cues. ABG Goal: LTG Patient will ambulate in home environment (PT) LTG: Patient will ambulate in home environment, # of feet with assistance (PT).  Downgraded to supervision for safety/cues. ABG  Problem: RH Other (Specify) Goal: RH LTG Other (Specify)1 Downgraded due to cues needed. ABG

## 2016-06-16 NOTE — Plan of Care (Signed)
Problem: RH Dressing Goal: LTG Patient will perform upper body dressing (OT) LTG Patient will perform upper body dressing with assist, with/without cues (OT).  Goal modified as pt requires assist with donning TLSO  Problem: RH Simple Meal Prep Goal: LTG Patient will perform simple meal prep w/assist (OT) LTG: Patient will perform simple meal prep with assistance, with/without cues (OT).  Goal downgraded due to progress- AL  Problem: RH Laundry Goal: LTG Patient will perform laundry w/assist, cues (OT) LTG: Patient will perform laundry with assistance, with/without cues (OT).  Outcome: Not Applicable Date Met:  06/34/94 Goal d/c as not a priority at this time and pt has friends who can assist with this at d/c.- AL  Problem: RH Light Housekeeping Goal: LTG Patient will perform light housekeeping w/assist (OT) LTG: Patient will perform light housekeeping with assistance, with/without cues (OT).  Goal modified due to pt progress

## 2016-06-16 NOTE — Progress Notes (Signed)
Occupational Therapy Weekly Progress Note  Patient Details  Name: [name] Weber MRN: [id number] Date of Birth: 09/20/1931  Beginning of progress report period: June 09, 2016 End of progress report period: June 16, 2016  Today's Date: 06/16/2016 OT Individual Time: 0700-0800 and 1300-1400 OT Individual Time Calculation (min): 60 min and 60 min   Patient has met 2 of 3 short term goals.  Pt is inconsistent in her ability to perform LB dressing with min A due to need to don B AFOS. She has demonstrated ability to don AFOs with significantly increased time and energy, however, is unable to tolerate full LB dressing task including underwear/ pants and B AFOs due to decreased activity tolerance.  Pt making steady gains in OT, however, she cont to require assist for donning B AFOs and TLSO due to decreased recall of technique. Pt most limited in AM session due to fatigue/ decreased activity tolerance, however, is able to perform better in PM tx sessions. She requires rest breaks throughout ADL bathing/dressing session, and is unable to complete bathing/dressing task in their entirety independently due to fatigue.   Patient continues to demonstrate the following deficits:acute pain, low back pain and muscle weakness (generalized)and therefore will continue to benefit from skilled OT intervention to enhance overall performance with BADL, iADL and Reduce care partner burden.  Patient not progressing toward long term goals.  See goal revision..  Plan of care revisions: Goals have been  downgraded to overall supervision/ set-up as pt requires VCs for proper donning techniques/ assist with donning B AFOs and TLSO as well as for safety awareness and carry over of education. .  OT Short Term Goals Week 1:  OT Short Term Goal 1 (Week 1): Pt will recall 3/3 back precautions  OT Short Term Goal 1 - Progress (Week 1): Met OT Short Term Goal 2 (Week 1): Pt will complete LB dressing with Min A OT Short Term  Goal 2 - Progress (Week 1): Partly met OT Short Term Goal 3 (Week 1): Pt will complete bathing with AE and Min A  OT Short Term Goal 3 - Progress (Week 1): Met Week 2:  OT Short Term Goal 1 (Week 2): Pt will correctly don TLSO in 3 consecutive sessions mod I  OT Short Term Goal 2 (Week 2): Pt will complete full bathing/dressing sessions with one rest break in order to increase functional activity toerance OT Short Term Goal 3 (Week 2): Pt will complete toileting task with supervision standing at [place] with distant supervision in order to increase independence with ADLs  Skilled Therapeutic Interventions/Progress Updates:    Session One: Pt seen for OT ADL bathing/dressing session. Pt asleep in supine upon arrival, easily awoken and agreeable to tx session. She voiced not sleeping well last night due to back discomfort, however, declined pain this morning.  Stand pivot completed for functional transfers prior to showering as pt without B AFOs donned. Stand pivots completed to w/c, toilet, and shower chair with use of RW/ grab bars and min A.  She bathed seated on shower chair with supervision, using lateral leans to complete buttock hygiene. Rest breaks required throughout session due to decreased activity tolerance and pt with complaints of dizziness and "feeling like I'm going to pass out following showering task. Vital signs WNL, and RN made aware of pt's complaints. Following extended rest break pt agreeable to attempt to stand to pull pants up, she was able to complete sit > stand with min A, however, required  heavy steadying assist while she pulled pants up. Socks, shoes, and B AFOs donned total A as pt too fatigued to complete remainder of dressing task. Pt left sitting up in w/c at end of session, all needs in reach.  She required min-mod VCs throughout session for proper donning technique of TLSO, demonstrating poor carryover of education taught in previous sessions.   Session Two: Pt seen for  OT session focusing on functional standing balance, UE strengthening, and functional standing balance. Pt sitting up in w/c upon arrival, voicing fatigue from outing, however, agreeable to tx session.  She ambulated to therapy gym with close supervision using RW, requiring cues to recall route to gym.  She completed dynamic standing task at [facility], completing ball toss with CGA with emphasis on maintaining dynamic balance without use of UEs. Upgraded task by having pt stand on non-compliant surface with pt having difficulties obtaining and then maintaining static balance on mat, requiring max A dynamic standing balance on non compliant surface. She completed UE strengthening exercises using level I theraband. Completed chest expanstion, diagonal up, diagonal down, bicep curl, and upright row x2 sets requiring verbal and demonstrational cues to recall exercises btwn sets and for proper form and technique. Completed simulated shower stall transfer using RW. Completed transfer to shower chair with min A, however, required mod A to return to RW due to decreased balance. Pt returned to room at end of session, left with all needs in reach.   Therapy Documentation Precautions:  Precautions Precautions: Back, Fall Precaution Booklet Issued: Yes (comment) Precaution Comments: Pt able to recall 3/3 precautions Required Braces or Orthoses: Other Brace/Splint Spinal Brace: Thoracolumbosacral orthotic, Other (comment), Applied in sitting position Other Brace/Splint: Bilateral AFOs Restrictions Weight Bearing Restrictions: No  See Function Navigator for Current Functional Status.   Therapy/Group: Individual Therapy  Lewis, Armie Moren C 06/16/2016, 6:39 AM

## 2016-06-16 NOTE — Plan of Care (Signed)
  Downgraded LTGs to overall supervision for safety due to need for verbal cues due to poor carryover with memory and cues for donning TLSO.   See care plan for details. Philip AspenAlison B. Kyrah Schiro, PT, DPT

## 2016-06-16 NOTE — Progress Notes (Signed)
Physical Therapy Session Note  Patient Details  Name: [name] Weber MRN: [id number] Date of Birth: 03/29/1931  Today's Date: 06/16/2016 PT Concurrent Time: 1100-1300 PT Concurrent Time Calculation (min): 120 min   Pt participated in community lunch outing with focus on functional mobility (gait and transfers) in community setting in crowded environment, stair navigation on and off of [place], education on energy conservation and safety in community, and overall endurance/activity tolerance. Pt requires overall supervision to steadying assist for mobility with cues for safe walker placement and negotiation of obstacles. See outing goal sheet in paper chart for details.   Therapy Documentation Precautions:  Precautions Precautions: Back, Fall Precaution Booklet Issued: Yes (comment) Precaution Comments: Pt able to recall 3/3 precautions Required Braces or Orthoses: Other Brace/Splint Spinal Brace: Thoracolumbosacral orthotic, Other (comment), Applied in sitting position Other Brace/Splint: Bilateral AFOs Restrictions Weight Bearing Restrictions: No:  Pain: Pain Assessment Pain Assessment: No/denies pain   See Function Navigator for Current Functional Status.   Therapy/Group: Concurrent Therapy and Community Reintegration  [name], Rhian Funari Darrol PokeBrescia  Joas Motton B. Mattson Dayal, PT, DPT  06/16/2016, 3:45 PM
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
## 2016-06-17 ENCOUNTER — Inpatient Hospital Stay (HOSPITAL_COMMUNITY): Payer: Medicare Other | Admitting: *Deleted

## 2016-06-17 ENCOUNTER — Inpatient Hospital Stay (HOSPITAL_COMMUNITY): Payer: Medicare Other | Admitting: Occupational Therapy

## 2016-06-17 LAB — CBC
HCT: 45 % (ref 36.0–46.0)
Hemoglobin: 14.8 g/dL (ref 12.0–15.0)
MCH: 31.9 pg (ref 26.0–34.0)
MCHC: 32.9 g/dL (ref 30.0–36.0)
MCV: 97 fL (ref 78.0–100.0)
Platelets: 277 10*3/uL (ref 150–400)
RBC: 4.64 MIL/uL (ref 3.87–5.11)
RDW: 13.1 % (ref 11.5–15.5)
WBC: 5.3 10*3/uL (ref 4.0–10.5)

## 2016-06-17 LAB — BASIC METABOLIC PANEL
Anion gap: 10 (ref 5–15)
BUN: 12 mg/dL (ref 6–20)
CO2: 28 mmol/L (ref 22–32)
Calcium: 9.5 mg/dL (ref 8.9–10.3)
Chloride: 97 mmol/L — ABNORMAL LOW (ref 101–111)
Creatinine, Ser: 0.73 mg/dL (ref 0.44–1.00)
GFR calc Af Amer: >60 mL/min (ref >60)
GFR calc non Af Amer: >60 mL/min (ref >60)
Glucose, Bld: 95 mg/dL (ref 65–99)
Potassium: 5 mmol/L (ref 3.5–5.1)
Sodium: 135 mmol/L (ref 135–145)
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
## 2016-06-17 NOTE — Progress Notes (Signed)
Occupational Therapy Session Note  Patient Details  Name: [name] Weber MRN: [id number] Date of Birth: 07/02/1931  Today's Date: 06/17/2016 OT Individual Time: 1300-1415 OT Individual Time Calculation (min): 75 min   Short Term Goals: Week 1:  OT Short Term Goal 1 (Week 1): Pt will recall 3/3 back precautions  OT Short Term Goal 1 - Progress (Week 1): Met OT Short Term Goal 2 (Week 1): Pt will complete LB dressing with Min A OT Short Term Goal 2 - Progress (Week 1): Partly met OT Short Term Goal 3 (Week 1): Pt will complete bathing with AE and Min A  OT Short Term Goal 3 - Progress (Week 1): Met   Week 2:  OT Short Term Goal 1 (Week 2): Pt will correctly don TLSO in 3 consecutive sessions mod I  OT Short Term Goal 2 (Week 2): Pt will complete full bathing/dressing sessions with one rest break in order to increase functional activity toerance OT Short Term Goal 3 (Week 2): Pt will complete toileting task with supervision standing at RW with distant supervision in order to increase independence with ADLs  Skilled Therapeutic Interventions/Progress Updates:  Patient found supine in bed. Pt willing to work with therapist. No complaints of pain, pt commented "it was bad this morning, but it's better now". Pt engaged in bed mobility and sat EOB, therapist set-up brace and patient donned TLSO with mod cueing and minimal assistance. From here, pt stood with RW and pivoted to bed side chair. Pt used reacher (with min cueing to use reacher) to gather clothing from bottom drawer of dresser. Pt then stood at sink for grooming task of brushing hair, and then ambulated to laundry room on [address]. Pt required one seated rest break at elevators. Pt put clothing in washer. Pt then ambulated to ADL apartment and performed toilet transfer and toileting tasks. From here, pt ambulated to bed in apartment and worked on stripping and making up bed as pt reports she does this once a week at home. Mod cueing and  occasional min assist needed during activity. Pt also required multiple rest breaks. Due to fatigue, therapist propelled pt back to room and assisted pt back to bed. At end of session, left pt supine in bed with all needs within reach and neighbor present at bedside.   Therapy Documentation Precautions:  Precautions Precautions: Back, Fall Precaution Booklet Issued: Yes (comment) Precaution Comments: Pt able to recall 3/3 precautions Required Braces or Orthoses: Other Brace/Splint Spinal Brace: Thoracolumbosacral orthotic, Other (comment), Applied in sitting position Other Brace/Splint: Bilateral AFOs Restrictions Weight Bearing Restrictions: No  Vital Signs: Therapy Vitals Temp: 98.2 F (36.8 C) Temp Source: Oral Pulse Rate: 88 Resp: 16 BP: 139/79 mmHg Patient Position (if appropriate): Lying Oxygen Therapy SpO2: 99 % O2 Device: Not Delivered  See Function Navigator for Current Functional Status.  Therapy/Group: Individual Therapy    , MS, OTR/L, CLT  06/17/2016, 2:18 PM  

## 2016-06-17 NOTE — Progress Notes (Signed)
Physical Therapy Session Note  Patient Details  Name: [name] Weber MRN: [id number] Date of Birth: 11/05/1931  Today's Date: 06/17/2016 PT Group time: 1000-1100 PT group minutes: 60 min  Skilled Therapeutic Interventions/Progress Updates:    Pt participated in physical therapy walking group with PT & recreational therapist. Session focused on transfer training & gait training in outdoor/community setting for endurance training. Pt able to complete all sit<>Stand transfers with supervision A from recliner & furniture in lobby. Gait training off of unit, outside over uneven surfaces, over ramp with RW & close supervision. Pt required frequent rest breaks 2/2 fatigue & pt report of being "winded". At end of session pt returned to room & supine in bed with supervision A. Pt left with all needs within reach.   During session pt reported 4/10 L hip/back pain & RN made aware.  Therapy Documentation Precautions:  Precautions Precautions: Back, Fall Precaution Booklet Issued: Yes (comment) Precaution Comments: Pt able to recall 3/3 precautions Required Braces or Orthoses: Other Brace/Splint Spinal Brace: Thoracolumbosacral orthotic, Other (comment), Applied in sitting position Other Brace/Splint: Bilateral AFOs Restrictions Weight Bearing Restrictions: No  Pain: Pain Assessment Pain Assessment: 0-10 Pain Score: 4  Pain Location: Hip (back) Pain Orientation: Left Pain Intervention(s): RN made aware   See Function Navigator for Current Functional Status.   Therapy/Group: Group Therapy  Sandi MariscalVictoria M Xiadani Damman 06/17/2016, 12:16 PM

## 2016-06-17 NOTE — Progress Notes (Signed)
Occupational Therapy Session Note  Patient Details  Name: Stephani Policevelyn C Mcavoy MRN: [id number] Date of Birth: 11/18/1931  Today's Date: 06/17/2016 OT Individual Time: [id number] OT Individual Time Calculation (min): 60 min    Short Term Goals: Week 2:  OT Short Term Goal 1 (Week 2): Pt will correctly don TLSO in 3 consecutive sessions mod I  OT Short Term Goal 2 (Week 2): Pt will complete full bathing/dressing sessions with one rest break in order to increase functional activity toerance OT Short Term Goal 3 (Week 2): Pt will complete toileting task with supervision standing at RW with distant supervision in order to increase independence with ADLs  Skilled Therapeutic Interventions/Progress Updates:    Pt seen for OT session focusing on ADL re-training. Pt sitting on toilet upon arrival, stating "I need to get back to bed". Steadying assist required for pt to stand at RW to complete clothing management before ambulating back to EOB with RW. Sitting EOB, pt with complaints of dizziness/ nausea, BP 117/65. She requested prolonged rest break in supine before attempting to dress. While resting, discussed d/c planning and recommendation for pt to have 24/7 supervision assistance at d/c. She voiced understanding, however, was unable to state who would be providing that assistance.  She dressed seated EOB, able to don B AFOs and TLSO with set-up and increased time. She completed grooming tasks standing at sink with supervision, requiring VCs for RW management during functional task.  In ADL apartment, pt completed bed mobility and stand pivot [facility] in simulation of home use. Stressed importance of pt having TLSO donned when completing stand pivot transfers.  Pt ambulated back to room at end of session, left sitting in recliner with all needs in reach. She declined pain, however, wanted ice pack for lower back- provided for comfort.   Therapy Documentation Precautions:   Precautions Precautions: Back, Fall Precaution Booklet Issued: Yes (comment) Precaution Comments: Pt able to recall 3/3 precautions Required Braces or Orthoses: Other Brace/Splint Spinal Brace: Thoracolumbosacral orthotic, Other (comment), Applied in sitting position Other Brace/Splint: Bilateral AFOs Restrictions Weight Bearing Restrictions: No  See Function Navigator for Current Functional Status.   Therapy/Group: Individual Therapy  Lewis, Triston Skare C 06/17/2016, 7:07 AM

## 2016-06-18 ENCOUNTER — Inpatient Hospital Stay (HOSPITAL_COMMUNITY): Payer: Medicare Other | Admitting: Occupational Therapy

## 2016-06-18 ENCOUNTER — Inpatient Hospital Stay (HOSPITAL_COMMUNITY): Payer: Medicare Other

## 2016-06-18 ENCOUNTER — Ambulatory Visit: Payer: Medicare Other | Admitting: Physical Therapy

## 2016-06-18 MED ORDER — LOPERAMIDE HCL 2 MG PO CAPS: 2.0000 mg | ORAL_CAPSULE | ORAL | Status: DC | PRN

## 2016-06-18 NOTE — Progress Notes (Signed)
Physical Therapy Discharge Summary  Patient Details  Name: [name] Weber MRN: [id number] Date of Birth: 07/19/1931  Patient has met 7 of 8 long term goals due to improved activity tolerance, improved balance and ability to compensate for deficits.  Patient to discharge at an ambulatory level Supervision using RW and bilateral AFOs. Due to pt fluctuating levels of cognition and only intermittent support available at home, pt requires SNF.  Reasons goals not met: Pt did not meet goal for donning/doffing TLSO as she still requires up to min assist at times.   Recommendation:  Patient will benefit from ongoing skilled PT services in skilled nursing facility setting to continue to advance safe functional mobility, address ongoing impairments in gait, balance, endurance, activity tolerance, cognition, safety, donning/doffing TLSO, and minimize fall risk.  Equipment: No equipment provided. Pt already owns RW and has bilateral AFOs.  Reasons for discharge: treatment goals met and discharge from hospital  Patient/family agrees with progress made and goals achieved: Yes  PT Discharge Precautions/Restrictions Precautions Precautions: Back;Fall Precaution Comments: Pt able to recall 3/3 precautions Required Braces or Orthoses: Other Brace/Splint;Spinal Brace Spinal Brace: Thoracolumbosacral orthotic;Other (comment);Applied in sitting position Other Brace/Splint: Bilateral AFOs Restrictions Weight Bearing Restrictions: No Cognition Overall Cognitive Status: Impaired/Different from baseline Orientation Level: Oriented X4 Memory: Impaired Memory Impairment: Decreased recall of new information (decreased carryover of donning brace as well) Safety/Judgment: Appears intact Sensation Sensation Light Touch: Appears Intact Coordination Gross Motor Movements are Fluid and Coordinated:  (not in BLE) Motor  Motor Motor: Other (comment) (Bilateral foot drop)  Locomotion  Stairs / Additional  Locomotion Ramp: 5: Supervision  Trunk/Postural Assessment  Cervical Assessment Cervical Assessment: Within Functional Limits Thoracic Assessment Thoracic Assessment: Exceptions to WFL (TLSO and back precautions) Lumbar Assessment Lumbar Assessment: Exceptions to WFL (posterior pelvic tilt; back precautions and TLSO)  Balance Balance Balance Assessed: Yes Timed Up and Go Test TUG: Normal TUG (with RW and bilateral AFOs) Normal TUG (seconds): 24.3 (average of 3 trials) Static Sitting Balance Static Sitting - Level of Assistance: 6: Modified independent (Device/Increase time) Dynamic Sitting Balance Sitting balance - Comments: mod I with TLSO donned Static Standing Balance Static Standing - Level of Assistance: 6: Modified independent (Device/Increase time) Dynamic Standing Balance Dynamic Standing - Level of Assistance: 6: Modified independent (Device/Increase time);5: Stand by assistance Extremity Assessment      RLE Assessment RLE Assessment: Exceptions to WFL RLE Strength RLE Overall Strength Comments: hip and knee grossly 4/5 throughout; ankle PF/DF 2-/5  LLE Assessment LLE Assessment: Exceptions to WFL LLE Strength LLE Overall Strength Comments: hip and knee grossly 4/5 throughout; ankle PF/DF 2-/5    See Function Navigator for Current Functional Status.  ,  [place]   B. , PT, DPT  06/19/2016, 2:38 PM   

## 2016-06-18 NOTE — Progress Notes (Signed)
Occupational Therapy Session Note  Patient Details  Name: [name] BOUCHILLONMRN: [id number] Date of Birth: Feb 13, 1931  Today's Date: 06/18/2016 OT Individual Time: 1105-1200 and 1330-1400 OT Individual Time Calculation (min): 55 min and 30 min   Short Term Goals: Week 2:  OT Short Term Goal 1 (Week 2): Pt will correctly don TLSO in 3 consecutive sessions mod I  OT Short Term Goal 2 (Week 2): Pt will complete full bathing/dressing sessions with one rest break in order to increase functional activity toerance OT Short Term Goal 3 (Week 2): Pt will complete toileting task with supervision standing at RW with distant supervision in order to increase independence with ADLs  Skilled Therapeutic Interventions/Progress Updates:    Session One: Pt seen for OT ADL bathing/dressing session. Pt sitting up in recliner upon arrival, voicing desire to complete showering task. She ambulated throughout session using RW with supervision.  She bathed seated on shower seat, requiring max cuing to remember not to stand without TLSO donned. She bathed seated on shower chair with supervision. Pt again unable to correctly don TLSO following showering task, OT referenced donning instruction sheet created in PT session and pt able to self correct with min cuing. Squat pivot completed to w/c due to pt not having AFOs donned. She dressed seated in w/c with set-up and supervision. Grooming completed standing at sink. She ambulated to ADL apartment where she completed simulated shower stall transfer. She was unable to recall technique for accessing shower with use of RW. She required min A for stability and cuing for technique and safety awareness. Pt still unable to recall need for TLSO to be donned when entering/ exiting shower and why supervision/ set-up will be required at home to assist with managing brace and equipment. Pt returned to room at end of session, requiring cuing to correctly identify room location. Pt left  sitting in w/c at end of session, set-up with meal tray and all needs in reach.  She became tearful towards end of session, saddend by her impaired cognition/ memory. She is in agreement with recommendation for 24 hr assist at supervision- min A level and reports friends unable to provide this assist at home.   Session Two: Pt seen for OT session focusing on functional standing balance and endurance as well as education with caregivers. Pt supine upon arrival with "half sisters"/ "friends" present. She transferred to EOB to don TLSO, requiring VCs and referencing to handout for proper donning techniques, pt with decreased carryover of education, as pt not able to independently refer to reference sheet.  She ambulated to therapy gym with supervision using RW with supervision. She desired to work on standing balance. Completed dynamic standing task without use of RW, initially requiring HHA, however, progressing to steadying assist/ CGA. Pt required to weight shift and cross midline during dynamic standing tasks. Pt left sitting on EOM with hand off to PT. Discussed with pt's friends/ caregiver's pt's current need for 24/hr supervision due to impaired cognition with poor carryover of education and insight into deficits. They voiced understanding, however, with many questions regarding nature of cognitive deficits as they report she was not like this PTA.  Answered questions as able and referred to MD for more answers.    Therapy Documentation Precautions:  Precautions Precautions: Back, Fall Precaution Booklet Issued: Yes (comment) Precaution Comments: Pt able to recall 3/3 precautions Required Braces or Orthoses: Other Brace/Splint Spinal Brace: Thoracolumbosacral orthotic, Other (comment), Applied in sitting position Other Brace/Splint: Bilateral AFOs Restrictions  Weight Bearing Restrictions: No Pain:   No/ denies pain  See Function Navigator for Current Functional Status.   Therapy/Group:  Individual Therapy  Lewis, Qais Jowers C 06/18/2016, 7:01 AM
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
## 2016-06-18 NOTE — Progress Notes (Signed)
Physical Therapy Session Note  Patient Details  Name: Stephani Policevelyn C Wescoat MRN: [id number] Date of Birth: 12/24/1930  Today's Date: 06/18/2016 PT Individual Time: 1400-1445 PT Individual Time Calculation (min): 45 min   Short Term Goals: Week 2:  PT Short Term Goal 1 (Week 2): STG=LTG based on estimated length of stay  Skilled Therapeutic Interventions/Progress Updates:   Session focused on re-administering Berg Balance test (see results below) and discussed results with patient educating on fall risk, neuro re-ed for balance retraining on compliant surface while completing functional reaching task while adhering to back precautions using RW for support at supervision level, dynamic gait through obstacle course to simulate home and community mobility with close supervision to steadying assist (when stepping over large bolster to simulate large threshold), and nustep for general endurance and strengthening of BLE x 8 min on level 4 with BLE only. Without AD, pt requiring up to modmax assist for balance. End of session set up in w/c with all needs in reach.   Therapy Documentation Precautions:  Precautions Precautions: Back, Fall Precaution Booklet Issued: Yes (comment) Precaution Comments: Pt able to recall 3/3 precautions Required Braces or Orthoses: Other Brace/Splint, Spinal Brace Spinal Brace: Thoracolumbosacral orthotic, Other (comment), Applied in sitting position Other Brace/Splint: Bilateral AFOs Restrictions Weight Bearing Restrictions: No  Pain: No complaints.  Balance: Standardized Balance Assessment Standardized Balance Assessment: Berg Balance Test Berg Balance Test Sit to Stand: Needs minimal aid to stand or to stabilize Standing Unsupported: Needs several tries to stand 30 seconds unsupported Sitting with Back Unsupported but Feet Supported on Floor or Stool: Able to sit safely and securely 2 minutes Stand to Sit: Uses backs of legs against chair to control  descent Transfers: Needs one person to assist Standing Unsupported with Eyes Closed: Needs help to keep from falling Standing Ubsupported with Feet Together: Needs help to attain position and unable to hold for 15 seconds From Standing, Reach Forward with Outstretched Arm: Loses balance while trying/requires external support From Standing Position, Pick up Object from Floor: Unable to try/needs assist to keep balance (back precautions) From Standing Position, Turn to Look Behind Over each Shoulder: Needs supervision when turning Turn 360 Degrees: Needs assistance while turning Standing Unsupported, Alternately Place Feet on Step/Stool: Needs assistance to keep from falling or unable to try Standing Unsupported, One Foot in Front: Loses balance while stepping or standing Standing on One Leg: Unable to try or needs assist to prevent fall Total Score: 10  See Function Navigator for Current Functional Status.   Therapy/Group: Individual Therapy  [name], Fairy Ashlock Darrol PokeBrescia  Jshaun Abernathy B. Ellysia Char, PT, DPT  06/18/2016, 3:38 PM

## 2016-06-18 NOTE — Discharge Summary (Signed)
Discharge summary job [id number]

## 2016-06-18 NOTE — NC FL2 (Signed)
MEDICAID FL2 LEVEL OF CARE SCREENING TOOL     IDENTIFICATION  Patient Name: [name] Weber Birthdate: [birth year] Sex: female Admission Date (Current Location): 06/08/2016  [facility] and [place] Number:  Producer, television/film/video and Address:  [facility], [address] [place], [place] [address]      Provider Number: [id number]  Attending Physician Name and Address:  Ranelle Oyster, MD  Relative Name and Phone Number:  Via Dora Sims - [phone]    Current Level of Care: Hospital Recommended Level of Care: Skilled Nursing Facility Prior Approval Number:    Date Approved/Denied:   PASRR Number: [id number]  Discharge Plan: SNF    Current Diagnoses: Patient Active Problem List   Diagnosis Date Noted  . Memory loss   . Lumbar burst fracture (HCC) 06/08/2016  . Lumbar compression fracture (HCC) 06/08/2016  . Bilateral foot-drop   . Midline thoracic back pain   . PAF (paroxysmal atrial fibrillation) (HCC)   . Depression   . Constipation due to pain medication   . Chronic constipation   . Compression fracture of L2 (HCC) 05/30/2016  . Elevated blood pressure 05/30/2016  . L2 vertebral fracture (HCC) 05/30/2016  . Anxiety state 12/10/2014  . Neurogenic bladder 11/30/2014  . Neurogenic bowel 11/30/2014  . Bacterial UTI 11/30/2014  . Thoracic myelopathy 11/29/2014  . Persistent atrial fibrillation (HCC) 11/27/2014  . Traumatic spinal subdural hematoma (HCC)   . Acute pulmonary embolism (HCC)   . Orthostatic hypotension 11/22/2014  . Near syncope 11/21/2014  . Paraplegia at T9 level (HCC) 11/17/2014  . Weakness of both legs   . Nocturnal leg cramps 07/16/2014  . Encounter for therapeutic drug monitoring 12/25/2013  . Malaise and fatigue 06/01/2011  . Atrial flutter (HCC) 03/10/2011  . Long term (current) use of anticoagulants 03/10/2011  . Benign hypertensive heart disease without heart failure 03/10/2011  .  Hypercholesterolemia 03/10/2011  . Paroxysmal atrial flutter (HCC)   . Palpitations   . PAC (premature atrial contraction)   . PVC's (premature ventricular contractions)   . Malaise   . Fatigue   . Myalgia     Orientation RESPIRATION BLADDER Height & Weight     Self, Time, Situation, Place  Normal Continent Weight: 42.139 kg (92 lb 14.4 oz) Height:     BEHAVIORAL SYMPTOMS/MOOD NEUROLOGICAL BOWEL NUTRITION STATUS      Continent Diet (heart healthy diet)  AMBULATORY STATUS COMMUNICATION OF NEEDS Skin   Limited Assist Verbally Bruising (in abdomen from heparin injections; protective sacral dressing)                       Personal Care Assistance Level of Assistance  Bathing, Dressing Bathing Assistance: Limited assistance Feeding assistance: Independent Dressing Assistance: Limited assistance     Functional Limitations Info    Sight Info: Adequate Hearing Info: Impaired Speech Info: Adequate    SPECIAL CARE FACTORS FREQUENCY  PT (By licensed PT), OT (By licensed OT)     PT Frequency: 5x/week OT Frequency: 5x/week            Contractures Contractures Info: Not present    Additional Factors Info    Code Status Info: Full code Allergies Info: Crestor           Current Medications (06/18/2016):  This is the current hospital active medication list Current Facility-Administered Medications  Medication Dose Route Frequency Provider Last Rate Last Dose  . acetaminophen (TYLENOL) tablet 650 mg  650 mg Oral Q6H PRN Mcarthur Rossettianiel J Angiulli, PA-C       Or  . acetaminophen (TYLENOL) suppository 650 mg  650 mg Rectal Q6H PRN Mcarthur Rossettianiel J Angiulli, PA-C      . ALPRAZolam Prudy Feeler(XANAX) tablet 0.25 mg  0.25 mg Oral BID PRN Jacquelynn Creeamela S Love, PA-C   0.25 mg at 06/09/16 1713  . aspirin EC tablet 81 mg  81 mg Oral Daily Mcarthur RossettiDaniel J Angiulli, PA-C   81 mg at 06/18/16 0756  . digoxin (LANOXIN) tablet 0.125 mg  0.125 mg Oral Daily Mcarthur RossettiDaniel J Angiulli, PA-C   0.125 mg at 06/18/16 0756  .  HYDROcodone-acetaminophen (NORCO/VICODIN) 5-325 MG per tablet 1 tablet  1 tablet Oral Q6H PRN Ranelle OysterZachary T Swartz, MD   1 tablet at 06/17/16 1051  . loperamide (IMODIUM) capsule 2 mg  2 mg Oral PRN Ranelle OysterZachary T Swartz, MD      . methocarbamol (ROBAXIN) tablet 500 mg  500 mg Oral Q6H PRN Ranelle OysterZachary T Swartz, MD      . ondansetron [facility](ZOFRAN) tablet 4 mg  4 mg Oral Q6H PRN Mcarthur Rossettianiel J Angiulli, PA-C   4 mg at 06/16/16 [id number]   Or  . ondansetron (ZOFRAN) injection 4 mg  4 mg Intravenous Q6H PRN Mcarthur Rossettianiel J Angiulli, PA-C      . oxyCODONE-acetaminophen (PERCOCET/ROXICET) 5-325 MG per tablet 1 tablet  1 tablet Oral Q4H PRN Mcarthur Rossettianiel J Angiulli, PA-C   1 tablet at 06/10/16 1514   And  . oxyCODONE (Oxy IR/ROXICODONE) immediate release tablet 5 mg  5 mg Oral Q4H PRN Mcarthur Rossettianiel J Angiulli, PA-C   5 mg at 06/10/16 1514  . pantoprazole (PROTONIX) EC tablet 40 mg  40 mg Oral Daily Ranelle OysterZachary T Swartz, MD   40 mg at 06/18/16 0756  . sorbitol 70 % solution 30 mL  30 mL Oral Daily PRN Charlton Amoraniel J Angiulli, PA-C         Discharge Medications: Please see discharge summary for a list of discharge medications.  Relevant Imaging Results:  Relevant Lab Results:   Additional Information SS#  [id number]  Keaton Beichner, Vista DeckJennifer Capps, LCSW

## 2016-06-18 NOTE — Progress Notes (Signed)
Physical Therapy Session Note  Patient Details  Name: [name] Weber MRN: [id number] Date of Birth: 01/03/1931  Today's Date: 06/18/2016 PT Individual Time: 0800-0900 PT Individual Time Calculation (min): 60 min   Short Term Goals: Week 2:  PT Short Term Goal 1 (Week 2): STG=LTG based on estimated length of stay  Skilled Therapeutic Interventions/Progress Updates:   Pt unsure if plan is still for d/c tomorrow as she knows CSW spoke with her friend but didn't know the outcome. Discussed concerns with being home alone for safety due to fluctuating cognition and need for assist for TLSO management and pt in agreement. Pt required verbal cues for correct donning of TLSO (pt put on backwards initially and unaware of mistake). Wrote out step by step instructions on a paper for pt to follow but still unable to complete correctly without verbal cues. Pt required overall supervision for all mobility including transfers (bed, furniture, and toilet), bed mobility on flat bed to simulate home environment, gait in controlled and home environment settings, ramp negotiation, car transfer and stairs to simulate home access. Pt used RW and bilateral AFOS (able to don those independently) for mobility. Administered TUG (3 trials with RW 24 sec, 24 sec, and 25 sec) indicating fall risk. Reviewed results with patient who verbalized understanding and in agreement. End of session set up in recliner with heating pad on and call bell in reach.    Spoke with CSW in regards to concerns and d/c plan.   Therapy Documentation Precautions:  Precautions Precautions: Back, Fall Precaution Booklet Issued: Yes (comment) Precaution Comments: Pt able to recall 3/3 precautions Required Braces or Orthoses: Other Brace/Splint, Spinal Brace Spinal Brace: Thoracolumbosacral orthotic, Other (comment), Applied in sitting position Other Brace/Splint: Bilateral AFOs Restrictions Weight Bearing Restrictions: No  Pain: Denies  pain and just received morning medication by RN.  See Function Navigator for Current Functional Status.   Therapy/Group: Individual Therapy  [name], Kyri Dai Darrol PokeBrescia  Emelee Rodocker B. Nitesh Pitstick, PT, DPT  06/18/2016, 10:31 AM

## 2016-06-18 NOTE — Progress Notes (Signed)
Occupational Therapy Discharge Summary  Patient Details  Name: [name] Weber MRN: [id number] Date of Birth: 1930-12-27   Patient has met 10 of 10 long term goals due to improved activity tolerance, improved balance, postural control and improved coordination.  Patient to discharge at overall Supervision level.  Patient's care partner is unable to provide 24 hr assistance, therefore pt with plans to d/c to SNF to cont to increase functional balance and address cognitive impairments as pt with poor carryover of education/ donning techniques for required TLSO.  Reasons goals not met: Pt requires min A for shower stall transfers due to decreased stability. Due to pt's impaired cognition and inability to maintain spinal precautions/ TLSO donning/ doffing, pt is very unsafe to be independent with any basic mobility or functional tasks.   Recommendation:  Patient will benefit from ongoing skilled OT services in home health setting to continue to advance functional skills in the area of BADL, iADL and Reduce care partner burden.  Equipment: BSC. Pt has shower chair  Reasons for discharge: treatment goals met and discharge from hospital  Patient/family agrees with progress made and goals achieved: Yes  OT Discharge Precautions/Restrictions  Precautions Precautions: Back;Fall Precaution Comments: Pt able to recall 3/3 precautions Required Braces or Orthoses: Other Brace/Splint;Spinal Brace Spinal Brace: Thoracolumbosacral orthotic;Other (comment);Applied in sitting position Other Brace/Splint: Bilateral AFOs Restrictions Weight Bearing Restrictions: No Vision/Perception  Vision- History Baseline Vision/History: Wears glasses Wears Glasses: Reading only Patient Visual Report: No change from baseline  Cognition Overall Cognitive Status: Impaired/Different from baseline Arousal/Alertness: Awake/alert Orientation Level: Oriented X4 Attention: Sustained Sustained Attention: Appears  intact Selective Attention: Appears intact Memory: Impaired Memory Impairment: Decreased recall of new information (decreased carry over of learned education) Awareness: Appears intact Problem Solving: Appears intact Safety/Judgment: Appears intact Sensation Sensation Light Touch: Appears Intact Stereognosis: Appears Intact Hot/Cold: Appears Intact Proprioception: Appears Intact Coordination Gross Motor Movements are Fluid and Coordinated: No (Impaired in B LEs) Fine Motor Movements are Fluid and Coordinated: Yes Motor  Motor Motor: Other (comment) (Hx of B foot drop)  Trunk/Postural Assessment  Cervical Assessment Cervical Assessment: Within Functional Limits Thoracic Assessment Thoracic Assessment: Exceptions to WFL (TLSO and back precautions) Lumbar Assessment Lumbar Assessment: Exceptions to WFL (TLSO and back precautions) Postural Control Postural Control: Within Functional Limits  Balance Balance Balance Assessed: Yes Static Sitting Balance Static Sitting - Balance Support: Feet supported Static Sitting - Level of Assistance: 6: Modified independent (Device/Increase time) Dynamic Sitting Balance Sitting balance - Comments: mod I with TLSO donned Static Standing Balance Static Standing - Balance Support: Right upper extremity supported;Left upper extremity supported Static Standing - Level of Assistance: 6: Modified independent (Device/Increase time) Dynamic Standing Balance Dynamic Standing - Balance Support: Bilateral upper extremity supported Dynamic Standing - Level of Assistance: 5: Stand by assistance Dynamic Standing - Comments: During LB dressing tasks/ grooming tasks at sink Extremity/Trunk Assessment RUE Assessment RUE Assessment: Within Functional Limits LUE Assessment LUE Assessment: Within Functional Limits   See Function Navigator for Current Functional Status.  Lewis, Alcie Runions C 06/19/2016, 11:54 AM

## 2016-06-19 ENCOUNTER — Inpatient Hospital Stay (HOSPITAL_COMMUNITY): Payer: Medicare Other | Admitting: Occupational Therapy

## 2016-06-19 ENCOUNTER — Inpatient Hospital Stay (HOSPITAL_COMMUNITY): Payer: Medicare Other | Admitting: Physical Therapy

## 2016-06-19 MED ORDER — METHOCARBAMOL 500 MG PO TABS: 500.0000 mg | ORAL_TABLET | Freq: Three times a day (TID) | ORAL | Status: DC | PRN

## 2016-06-19 MED ORDER — HYDROCODONE-ACETAMINOPHEN 5-325 MG PO TABS: 1.0000 | ORAL_TABLET | Freq: Two times a day (BID) | ORAL | Status: DC | PRN

## 2016-06-19 NOTE — Progress Notes (Signed)
**Note De-Identified [name] Obfuscation** Social Work Patient ID: [name] Weber, female   DOB: 02/27/1931, 82 y.o.   MRN: [id number]   CSW met with pt and then later talked with pt's friend and POA to update them on team conference discussion.  Pt and POA, [name] Weber, agreed that pt needs to go for more therapy prior to trying to be at home on her own, especially with pt's difficulty with carryover of information and with no one to assist pt at home.  CSW has started the process for pt to transfer to SNF, now awaiting bed offers for pt and POA to choose from.  CSW will continue to follow and assist with SNF transfer.

## 2016-06-19 NOTE — Progress Notes (Signed)
Occupational Therapy Session Note  Patient Details  Name: [name] Weber MRN: [id number] Date of Birth: 04/30/1931  Today's Date: 06/19/2016 OT Individual Time: 1000-1100 OT Individual Time Calculation (min): 60 min    Short Term Goals: Week 2:  OT Short Term Goal 1 (Week 2): Pt will correctly don TLSO in 3 consecutive sessions mod I  OT Short Term Goal 2 (Week 2): Pt will complete full bathing/dressing sessions with one rest break in order to increase functional activity toerance OT Short Term Goal 3 (Week 2): Pt will complete toileting task with supervision standing at RW with distant supervision in order to increase independence with ADLs  Skilled Therapeutic Interventions/Progress Updates:    Pt seen for OT ADL bathing/dressing session. Pt in supine upon arrival, agreeable to tx session.  She required x4 attempts to correctly orient TLSO for proper donning positioning with questioning cues provided to alert pt to brace being donned incorrectly. She ambulated with RW and supervision to gather clothing items in prep for shower. She bathed seated on shower chair with distant supervision, requiring assist for sequencing of tasks in sitting/standing with TLSO donned/ doffed.  She dressed seated in w/c with set-up and supervision for sit <> stand at RW, able to don B AFOs with increased time. Pt completed grooming tasks standing at sink, one UE support used for stability. At end of session, completed UE theraband strengthening exercises, requiring VCs and demonstration for recalling exercises taught in previous sessions. Pt left sitting in w/c at end of session, all needs in reach.  Pt tearful during parts of session, saddened by "the burden she is on everyone else". Empathetic listening and support/ encouragement provided.   Therapy Documentation Precautions:  Precautions Precautions: Back, Fall Precaution Booklet Issued: Yes (comment) Precaution Comments: Pt able to recall 3/3  precautions Required Braces or Orthoses: Other Brace/Splint, Spinal Brace Spinal Brace: Thoracolumbosacral orthotic, Other (comment), Applied in sitting position Other Brace/Splint: Bilateral AFOs Restrictions Weight Bearing Restrictions: No Pain: Pain Assessment Pain Assessment: No/denies pain  See Function Navigator for Current Functional Status.   Therapy/Group: Individual Therapy  Lewis, Raimundo Corbit C 06/19/2016, 6:56 AM
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
## 2016-06-19 NOTE — Progress Notes (Signed)
Pt transferred to SNF via private transport. Discharge instructions accompanied pt. Pt escorted off unit in w/c with personal belonging by Allena NapoleonKieara, NT
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
## 2016-06-19 NOTE — Progress Notes (Signed)
Physical Therapy Session Note  Patient Details  Name: [name] Weber MRN: [id number] Date of Birth: 06/21/1931  Today's Date: 06/19/2016 PT Individual Time: 0800-0853 PT Individual Time Calculation (min): 53 min   Short Term Goals: Week 2:  PT Short Term Goal 1 (Week 2): STG=LTG based on estimated length of stay  Skilled Therapeutic Interventions/Progress Updates:   Treatment 1: Session focused on bed mobility with mod I using bed rails, donning TLSO with no cues seated EOB with setup assist, donning socks and shoes with B AFOs seated EOB with increased time, sit to and from stand transfers using RW, gait training using RW > 150 ft in controlled environment and x 10 ft across uneven wood chip surface, simulated car transfer to sedan height using RW, stair training up/down 12 (6") stairs laterally using 1 rail with min verbal cues for safe foot placement, standing on foam pad with RW for UE support while completing bimanual task of tabletop pipetree puzzle x 7 min with steady assist and verbal cues for error recognition and problem solving. Patient requested to toilet, ambulated to bathroom in ADL apartment and performed toileting tasks and hand hygiene with supervision. Patient ambulated back to room and requested to return to bed, doffed TLSO and left semi reclined with all needs in reach.   Therapy Documentation Precautions:  Precautions Precautions: Back, Fall Precaution Booklet Issued: Yes (comment) Precaution Comments: Pt able to recall 3/3 precautions Required Braces or Orthoses: Other Brace/Splint, Spinal Brace Spinal Brace: Thoracolumbosacral orthotic, Other (comment), Applied in sitting position Other Brace/Splint: Bilateral AFOs Restrictions Weight Bearing Restrictions: No Pain: Pain Assessment Pain Assessment: No/denies pain  See Function Navigator for Current Functional Status.   Therapy/Group: Individual Therapy  Kerney ElbeVarner, Catherina Pates A 06/19/2016, 8:56 AM

## 2016-06-19 NOTE — Plan of Care (Signed)
Problem: RH Tub/Shower Transfers Goal: LTG Patient will perform tub/shower transfers w/assist (OT) LTG: Patient will perform tub/shower transfers with assist, with/without cues using equipment (OT)  Outcome: Not Met (add Reason) Pt requires min A for shower stall transfer. - AL

## 2016-06-19 NOTE — Plan of Care (Signed)
Problem: RH Other (Specify) Goal: RH LTG Other (Specify)1 Outcome: Not Met (add Reason) Pt requires up to min assist (inconsistent between supervision to min assist) for donning TLSO.

## 2016-06-19 NOTE — Progress Notes (Signed)
Occupational Therapy Session Note  Patient Details  Name: [name] Weber MRN: [id number] Date of Birth: September 10, 1931  Today's Date: 06/19/2016 OT Individual Time: 1100-1130 OT Individual Time Calculation (min): 30 min   Short Term Goals: Week 1:  OT Short Term Goal 1 (Week 1): Pt will recall 3/3 back precautions  OT Short Term Goal 1 - Progress (Week 1): Met OT Short Term Goal 2 (Week 1): Pt will complete LB dressing with Min A OT Short Term Goal 2 - Progress (Week 1): Partly met OT Short Term Goal 3 (Week 1): Pt will complete bathing with AE and Min A  OT Short Term Goal 3 - Progress (Week 1): Met   Week 2:  OT Short Term Goal 1 (Week 2): Pt will correctly don TLSO in 3 consecutive sessions mod I  OT Short Term Goal 2 (Week 2): Pt will complete full bathing/dressing sessions with one rest break in order to increase functional activity toerance OT Short Term Goal 3 (Week 2): Pt will complete toileting task with supervision standing at [place] with distant supervision in order to increase independence with ADLs  Skilled Therapeutic Interventions/Progress Updates:  Patient found seated in w/c at sink finishing grooming tasks. Pt with no complaints of pain. Therapist had patient work on doffing, then donning TLSO; pt able to do so with min assist for right axillary strap. Therapist then had pt state back precautions, pt able to state 2/3, needing cueing for no arching. Therapist discussed how patient would pick up items off floor and pt able to independently verbalize that she would use a reacher to pick up items so she would not bend. Pt then engaged in BUE HEP using level 1 theraband; shoulder abduction/adduction, shoulder flexion/extenstion, horizontal abduction/adduction, elbow flexion/extension - X10 each. Pt with good carryover regarding BUE HEP. Therapist then had pt stand to complete BUE HEP, X10 each. Therapist educated pt on importance of sitting for HEP when she is alone and that the reason  therapist had her stand was to work on her balance. At end of session, left pt seated in w/c with all needs within reach.    Therapy Documentation Precautions:  Precautions Precautions: Back, Fall Precaution Booklet Issued: Yes (comment) Precaution Comments: Pt able to recall 3/3 precautions Required Braces or Orthoses: Other Brace/Splint, Spinal Brace Spinal Brace: Thoracolumbosacral orthotic, Other (comment), Applied in sitting position Other Brace/Splint: Bilateral AFOs Restrictions Weight Bearing Restrictions: No  Vital Signs: Therapy Vitals Temp: 97.5 F (36.4 C) Temp Source: Oral Pulse Rate: 84 Resp: 18 BP: 122/77 mmHg Patient Position (if appropriate): Lying Oxygen Therapy SpO2: 96 % O2 Device: Not Delivered  See Function Navigator for Current Functional Status.  Therapy/Group: Individual Therapy  Chrys Racer , MS, OTR/L, CLT  06/19/2016, 12:08 PM
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
## 2016-06-21 ENCOUNTER — Inpatient Hospital Stay (HOSPITAL_COMMUNITY): Payer: Medicare Other | Admitting: Occupational Therapy

## 2016-06-22 NOTE — Progress Notes (Signed)
Social Work Discharge Note  The overall goal for the admission was met for:   Discharge location: No - pt to SNF  Length of Stay: Yes - 11 days  Discharge activity level: No - Supervision to minimal assistance  Home/community participation: No - Pt's d/c disposition changed to SNF due to needing 24/7 supervision and some minimal assistance  Services provided included: MD, RD, PT, OT, RN, Pharmacy, Margaret: Private Insurance: [employer]  Follow-up services arranged: Other: Pt transferred to [facility]  Comments (or additional information):  Pt could not meet modified independent goals as originally we and pt/family had hoped for and pt could not go home alone safely.  Pt to [facility] for more rehab prior to trying to return home.  POA may need to hire private duty care when pt does go home.  Pt transferred to SNF with POA via private vehicle.  Patient/Family verbalized understanding of follow-up arrangements: Yes  Individual responsible for coordination of the follow-up plan: pt with support/assistance from [place], friend/POA  Confirmed correct DME delivered: Trey Sailors 06/22/2016    Laurey Salser, Silvestre Mesi

## 2016-06-23 ENCOUNTER — Ambulatory Visit: Payer: Medicare Other | Admitting: Physical Therapy

## 2016-06-25 ENCOUNTER — Telehealth: Payer: Self-pay | Admitting: *Deleted

## 2016-06-25 ENCOUNTER — Ambulatory Visit: Payer: Medicare Other | Admitting: Physical Therapy

## 2016-06-25 NOTE — Telephone Encounter (Signed)
Called patient to remind of 06-30-16 28:45 APPT.  , 06-25-16   0930.  Unable TO LEAVE MESSAGE, MAIL BOX FULL.Marland Kitchen

## 2016-06-30 ENCOUNTER — Ambulatory Visit: Payer: Medicare Other | Admitting: Physical Therapy

## 2016-06-30 ENCOUNTER — Encounter: Payer: Medicare Other | Attending: Physical Medicine & Rehabilitation | Admitting: Physical Medicine & Rehabilitation

## 2016-06-30 ENCOUNTER — Encounter: Payer: Self-pay | Admitting: Physical Medicine & Rehabilitation

## 2016-06-30 VITALS — BP 123/74 | HR 75

## 2016-06-30 DIAGNOSIS — S32028S Other fracture of second lumbar vertebra, sequela: Secondary | ICD-10-CM | POA: Diagnosis not present

## 2016-06-30 DIAGNOSIS — S32029A Unspecified fracture of second lumbar vertebra, initial encounter for closed fracture: Secondary | ICD-10-CM | POA: Diagnosis not present

## 2016-06-30 DIAGNOSIS — Z86718 Personal history of other venous thrombosis and embolism: Secondary | ICD-10-CM | POA: Diagnosis not present

## 2016-06-30 DIAGNOSIS — G8220 Paraplegia, unspecified: Secondary | ICD-10-CM

## 2016-06-30 DIAGNOSIS — Z7901 Long term (current) use of anticoagulants: Secondary | ICD-10-CM | POA: Insufficient documentation

## 2016-06-30 DIAGNOSIS — Z5189 Encounter for other specified aftercare: Secondary | ICD-10-CM | POA: Insufficient documentation

## 2016-06-30 DIAGNOSIS — G839 Paralytic syndrome, unspecified: Secondary | ICD-10-CM | POA: Diagnosis not present

## 2016-06-30 DIAGNOSIS — Z87891 Personal history of nicotine dependence: Secondary | ICD-10-CM | POA: Diagnosis not present

## 2016-06-30 DIAGNOSIS — I481 Persistent atrial fibrillation: Secondary | ICD-10-CM | POA: Diagnosis not present

## 2016-06-30 NOTE — Patient Instructions (Signed)
1. FOLLOW PAPER INSTRUCTIONS ON TLSO PLACEMENT AND FIT 2. KNEE HIGH TEDS---WEAR DAILY ON AM OFF PM 3. ICE AFTER THERAPY TO LEFT ANKLE 4. IF URINE URGENCY PERSISTS, PLEASE COLLECT A URINALYSIS AND CULTURE.
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
## 2016-07-15 ENCOUNTER — Telehealth: Payer: Self-pay | Admitting: Physical Medicine & Rehabilitation

## 2016-07-15 NOTE — Telephone Encounter (Signed)
Rx is in my box 

## 2016-07-15 NOTE — Telephone Encounter (Signed)
[facility] is requesting a prescription for a chairlift/mobility lift, patient can't walk up stairs.  With a prescription patient will not have to pay taxes for it and it will save her around $100.  Any questions please call [facility] at [phone].

## 2016-07-16 NOTE — Telephone Encounter (Signed)
Pt notified per April Marshall

## 2016-07-20 ENCOUNTER — Telehealth: Payer: Self-pay | Admitting: Physical Medicine & Rehabilitation

## 2016-07-20 NOTE — Telephone Encounter (Signed)
Patient has a Aspin back brace that she can't get on correctly.  Would like to know if there is a different brace she could get.  Please call [facility] [place] back at [phone].

## 2016-07-21 NOTE — Telephone Encounter (Signed)
They need to contact her back surgeon

## 2016-07-22 NOTE — Telephone Encounter (Signed)
Spoke with Via's husband. Advised for pt to return call when available.

## 2016-07-24 NOTE — Telephone Encounter (Signed)
I spoke with [facility].  They have taken her to [facility] and had them adjust brace.  [facility]

## 2016-08-10 ENCOUNTER — Encounter: Payer: Medicare Other | Attending: Physical Medicine & Rehabilitation | Admitting: Physical Medicine & Rehabilitation

## 2016-08-10 ENCOUNTER — Encounter: Payer: Self-pay | Admitting: Physical Medicine & Rehabilitation

## 2016-08-10 VITALS — BP 128/82 | HR 74

## 2016-08-10 DIAGNOSIS — G839 Paralytic syndrome, unspecified: Secondary | ICD-10-CM | POA: Diagnosis not present

## 2016-08-10 DIAGNOSIS — S32029A Unspecified fracture of second lumbar vertebra, initial encounter for closed fracture: Secondary | ICD-10-CM | POA: Diagnosis not present

## 2016-08-10 DIAGNOSIS — G8220 Paraplegia, unspecified: Secondary | ICD-10-CM

## 2016-08-10 DIAGNOSIS — S32020S Wedge compression fracture of second lumbar vertebra, sequela: Secondary | ICD-10-CM

## 2016-08-10 DIAGNOSIS — Z86718 Personal history of other venous thrombosis and embolism: Secondary | ICD-10-CM | POA: Insufficient documentation

## 2016-08-10 DIAGNOSIS — I481 Persistent atrial fibrillation: Secondary | ICD-10-CM | POA: Diagnosis not present

## 2016-08-10 DIAGNOSIS — Z5189 Encounter for other specified aftercare: Secondary | ICD-10-CM | POA: Diagnosis present

## 2016-08-10 DIAGNOSIS — K5903 Drug induced constipation: Secondary | ICD-10-CM

## 2016-08-10 DIAGNOSIS — K592 Neurogenic bowel, not elsewhere classified: Secondary | ICD-10-CM

## 2016-08-10 DIAGNOSIS — Z7901 Long term (current) use of anticoagulants: Secondary | ICD-10-CM | POA: Insufficient documentation

## 2016-08-10 DIAGNOSIS — K5900 Constipation, unspecified: Secondary | ICD-10-CM | POA: Diagnosis not present

## 2016-08-10 DIAGNOSIS — Z87891 Personal history of nicotine dependence: Secondary | ICD-10-CM | POA: Insufficient documentation

## 2016-08-10 DIAGNOSIS — K5909 Other constipation: Secondary | ICD-10-CM

## 2016-08-10 MED ORDER — ALENDRONATE SODIUM 35 MG PO TABS
35.0000 mg | ORAL_TABLET | ORAL | 2 refills | Status: DC
Start: 2016-08-16 — End: 2016-10-07

## 2016-08-10 NOTE — Patient Instructions (Signed)
PLEASE CALL ME WITH ANY PROBLEMS OR QUESTIONS ([phone])  
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
## 2016-09-09 ENCOUNTER — Ambulatory Visit: Payer: Medicare Other | Attending: Physical Medicine & Rehabilitation | Admitting: Physical Therapy

## 2016-09-09 DIAGNOSIS — R2681 Unsteadiness on feet: Secondary | ICD-10-CM

## 2016-09-09 DIAGNOSIS — M6281 Muscle weakness (generalized): Secondary | ICD-10-CM | POA: Diagnosis present

## 2016-09-09 DIAGNOSIS — R2689 Other abnormalities of gait and mobility: Secondary | ICD-10-CM
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
## 2016-09-15 ENCOUNTER — Ambulatory Visit: Payer: Medicare Other | Admitting: Physical Therapy

## 2016-09-17 ENCOUNTER — Ambulatory Visit: Payer: Medicare Other | Admitting: Physical Therapy

## 2016-09-17 DIAGNOSIS — R2689 Other abnormalities of gait and mobility: Secondary | ICD-10-CM | POA: Diagnosis not present

## 2016-09-17 DIAGNOSIS — R2681 Unsteadiness on feet: Secondary | ICD-10-CM

## 2016-09-17 DIAGNOSIS — M6281 Muscle weakness (generalized): Secondary | ICD-10-CM

## 2016-09-17 NOTE — Patient Instructions (Signed)
Sit to Stand Transfers:  1. Scoot out to the edge of the chair 2. Place your feet flat on the floor, shoulder width apart.  Make sure your feet are tucked just under your knees. 3. Lean forward (nose over toes) with momentum, and stand up tall with your best posture.  If you need to use your arms, use them as a quick boost up to stand. 4. If you are in a low or soft chair, you can lean back and then forward up to stand, in order to get more momentum. 5. Once you are standing, make sure you are looking ahead and standing tall.  To sit down:  1. Back up until you feel the chair behind your legs. 2. Bend at you hips, reaching  Back for you chair, if needed, then slowly squat to sit down on your chair.    KNEE: Extension, Long Arc Quad (Band)    Place yellow band around both ankles. Pull band forward until knee is straight. Do 10 times on each leg once a day.  Copyright  VHI. All rights reserved.   Hip (Front)    Place yellow band around both knees.  Begin sitting tall, both feet flat on floor. Inhale, then exhale while lifting knee as high as is comfortable, keeping upper body straight and still. Slowly return to starting position. Repeat 10 times on each leg once a day.  Copyright  VHI. All rights reserved.   HIP: Flexion / KNEE: Extension, Straight Leg Raise    Raise leg, keeping knee straight. Perform slowly. Do 10 times on each leg once a day.   Copyright  VHI. All rights reserved.    Abduction: Clam (Eccentric) - Side-Lying    Lie on side with knees bent. Place blue band around both knees.  Lift top knee, keeping feet together. Keep trunk steady. Slowly lower for 3-5 seconds. Do 10 times on each side once a day.  http://ecce.exer.us/65   Copyright  VHI. All rights reserved.    Abduction: Side Leg Lift (Eccentric) - Side-Lying    Lie on side. Lift top leg slightly higher than shoulder level. Keep top leg straight with body, toes pointing forward. Slowly  lower for 3-5 seconds.  Do 10 times on each side, once a day.  http://ecce.exer.us/63   Copyright  VHI. All rights reserved.    Hip Abduction / Adduction: with Knee Flexion (Supine)    With both knees bent and blue band around both knees, gently lower both knees to side and return. Repeat 10 times.  Can also hold one leg steady and bring the other knee to the side.  Repeat 10 times on each side.  Do once a day.  http://orth.exer.us/683   Copyright  VHI. All rights reserved.

## 2016-09-17 NOTE — Therapy (Signed)
[facility] [address] [place], [place], [address] Phone: [phone]   Fax:  [phone]  Physical Therapy Treatment  Patient Details  Name: [name] Weber MRN: [id number] Date of Birth: 05/04/1931 Referring Provider: Riley Kill  Encounter Date: 09/17/2016      PT End of Session - 09/17/16 1502    Visit Number 2   Number of Visits 17   Date for PT Re-Evaluation 11/08/16   Authorization Type UHC Medicare-GCODE every 10th visit   PT Start Time 1404   PT Stop Time 1447   PT Time Calculation (min) 43 min   Activity Tolerance Patient tolerated treatment well   Behavior During Therapy [facility] for tasks assessed/performed      Past Medical History:  Diagnosis Date  . Fatigue   . Malaise   . Myalgia   . PAC (premature atrial contraction)    ISOLATED  . Palpitations    OCCASSIONAL  . Paroxysmal atrial flutter (HCC)   . PVC's (premature ventricular contractions)    ISOLATED    Past Surgical History:  Procedure Laterality Date  . THORACIC LAMINECTOMY FOR EPIDURAL ABSCESS N/A 11/16/2014   Procedure: THORACIC LAMINECTOMY FOR EPIDURAL ABSCESS;  Surgeon: Karn Cassis, MD;  Location: [facility];  Service: Neurosurgery;  Laterality: N/A;  . VULVAR LESION REMOVAL  01/01/2009   She had a 1-cm area of erythema to the right of the urethral meatus    There were no vitals filed for this visit.            [facility] Adult PT Treatment/Exercise - 09/17/16 0001      Self-Care   Self-Care Other Self-Care Comments   Other Self-Care Comments  Pt came into clinic with brace higher than needed.  Pt states it rides up.  Had pt adjust brace in seated position.  Took several times for patient to adjust brace to proper position.  Educated on proper fit of brace and need to possibly adjust brace during the day.     Exercises   Exercises Knee/Hip     Knee/Hip Exercises: Aerobic   Other Aerobic Scifit level 2.5 all 4  extremities x 10 minutes for strengthening     Knee/Hip Exercises: Seated   Long Arc Quad Both;10 reps;Other (comment)  yellow theraband   Marching Both;10 reps;Other (comment)  yellow theraband   Abduction/Adduction  Both;10 reps;Other (comment)  yellow theraband   Sit to Sand 10 reps;with UE support;Other (comment)  cues for technique;tends to push posteriorly against mat     Knee/Hip Exercises: Supine   Straight Leg Raises Both;10 reps   Other Supine Knee/Hip Exercises Seated marching x 15 reps with blue theraband bil sides   Other Supine Knee/Hip Exercises hooklying for bil hip abduction/add x 10 reps then repeated 10 times isolating for single leg     Knee/Hip Exercises: Sidelying   Hip ABduction Both;10 reps   Clams 10 reps with blue theraband and cues for full ROM                PT Education - 09/17/16 1501    Education provided Yes   Education Details HEP, transfers, proper fit of TLSO   Person(s) Educated Patient   Methods Explanation;Demonstration;Verbal cues;Handout   Comprehension Verbalized understanding;Returned demonstration          PT Short Term Goals - 09/10/16 1324      PT SHORT TERM GOAL #1   Title Pt will be independent with HEP for improved  strength, balance, and gait.  TARGET 10/09/16   Time 4   Period Weeks   Status New     PT SHORT TERM GOAL #2   Title Pt will improve TUG score to less than or equal to 20 seconds for decreased fall risk.   Time 4   Period Weeks   Status New     PT SHORT TERM GOAL #3   Title Pt will improve Berg Balance score to at least 24/56 for decreased fall risk.   Time 4   Period Weeks   Status New     PT SHORT TERM GOAL #4   Title Pt will perform at least 5 minutes of standing activities with intermittent UE support for improved participation in ADLs and household activities.   Time 4   Period Weeks   Status New           PT Long Term Goals - 09/10/16 0926      PT LONG TERM GOAL #1   Title Pt  will verbalize understanding of fall prevention within the home environemnt.  TARGET 11/08/16   Time 8   Period Weeks   Status New     PT LONG TERM GOAL #2   Title Pt will improve TUG score to less than or equal to 17 seconds for decreased fall risk.   Time 8   Period Weeks   Status New     PT LONG TERM GOAL #3   Title Pt will improve Berg balance score to at least 29/56 for decreased fall risk.     PT LONG TERM GOAL #4   Title Pt will improve gait velocity to at least 2.4 ft/sec for improved gait efficiency and safety with household and community gait.   Time 8   Period Weeks   Status New     PT LONG TERM GOAL #5   Title Pt will verbalize understanding of transition to community fitness upon D/C from PT.   Time 8   Period Weeks   Status New               Plan - 09/17/16 1504    Clinical Impression Statement Pt motivated to increase mobility.  Needs cues to slow down with exercises.  Continue PT per POC.   Rehab Potential Good   PT Frequency 2x / week   PT Duration 8 weeks  plus eval   PT Treatment/Interventions ADLs/Self Care Home Management;Functional mobility training;Gait training;DME Instruction;Therapeutic activities;Therapeutic exercise;Balance training;Neuromuscular re-education;Patient/family education;Orthotic Fit/Training   PT Next Visit Plan Review HEP, add balance to HEP.   Consulted and Agree with Plan of Care Patient;Family member/caregiver   Family Member Consulted Neighbor, Vi      Patient will benefit from skilled therapeutic intervention in order to improve the following deficits and impairments:  Abnormal gait, Decreased balance, Decreased mobility, Decreased range of motion, Decreased safety awareness, Decreased strength, Difficulty walking, Impaired flexibility, Postural dysfunction  Visit Diagnosis: Other abnormalities of gait and mobility  Unsteadiness on feet  Muscle weakness (generalized)     Problem List Patient Active Problem  List   Diagnosis Date Noted  . Memory loss   . Lumbar burst fracture (HCC) 06/08/2016  . Lumbar compression fracture (HCC) 06/08/2016  . Bilateral foot-drop   . Midline thoracic back pain   . PAF (paroxysmal atrial fibrillation) (HCC)   . Depression   . Constipation due to pain medication   . Chronic constipation   . Compression fracture of L2 (HCC)  05/30/2016  . Elevated blood pressure 05/30/2016  . L2 vertebral fracture (HCC) 05/30/2016  . Anxiety state 12/10/2014  . Neurogenic bladder 11/30/2014  . Neurogenic bowel 11/30/2014  . Bacterial UTI 11/30/2014  . Thoracic myelopathy 11/29/2014  . Persistent atrial fibrillation (HCC) 11/27/2014  . Traumatic spinal subdural hematoma   . Acute pulmonary embolism (HCC)   . Orthostatic hypotension 11/22/2014  . Near syncope 11/21/2014  . Paraplegia at T9 level (HCC) 11/17/2014  . Weakness of both legs   . Nocturnal leg cramps 07/16/2014  . Encounter for therapeutic drug monitoring 12/25/2013  . Malaise and fatigue 06/01/2011  . Atrial flutter (HCC) 03/10/2011  . Long term (current) use of anticoagulants 03/10/2011  . Benign hypertensive heart disease without heart failure 03/10/2011  . Hypercholesterolemia 03/10/2011  . Paroxysmal atrial flutter (HCC)   . Palpitations   . PAC (premature atrial contraction)   . PVC's (premature ventricular contractions)   . Malaise   . Fatigue   . Myalgia     Newell Coral 09/17/2016, 3:15 PM  [facility] [address] [place], [place], [address] Phone: [phone]   Fax:  [phone]  Name: DEROTHA FISHBAUGH MRN: [id number] Date of Birth: [birth year]  Newell Coral, [facility] 09/17/16 3:15 PM Phone: [phone] Fax: [phone]

## 2016-09-22 ENCOUNTER — Ambulatory Visit: Payer: Medicare Other | Admitting: Physical Therapy

## 2016-09-22 DIAGNOSIS — M6281 Muscle weakness (generalized): Secondary | ICD-10-CM

## 2016-09-22 DIAGNOSIS — R2689 Other abnormalities of gait and mobility: Secondary | ICD-10-CM | POA: Diagnosis not present

## 2016-09-22 DIAGNOSIS — R2681 Unsteadiness on feet: Secondary | ICD-10-CM

## 2016-09-22 NOTE — Therapy (Signed)
[facility] [address] [place], [place], [address] Phone: [phone]   Fax:  [phone]  Physical Therapy Treatment  Patient Details  Name: [name] Weber MRN: [id number] Date of Birth: 04/02/1931 Referring Provider: Riley Kill  Encounter Date: 09/22/2016      PT End of Session - 09/22/16 1549    Visit Number 3   Number of Visits 17   Date for PT Re-Evaluation 11/08/16   Authorization Type UHC Medicare-GCODE every 10th visit   PT Start Time 1150   PT Stop Time 1235   PT Time Calculation (min) 45 min   Activity Tolerance Patient tolerated treatment well   Behavior During Therapy [facility] for tasks assessed/performed      Past Medical History:  Diagnosis Date  . Fatigue   . Malaise   . Myalgia   . PAC (premature atrial contraction)    ISOLATED  . Palpitations    OCCASSIONAL  . Paroxysmal atrial flutter (HCC)   . PVC's (premature ventricular contractions)    ISOLATED    Past Surgical History:  Procedure Laterality Date  . THORACIC LAMINECTOMY FOR EPIDURAL ABSCESS N/A 11/16/2014   Procedure: THORACIC LAMINECTOMY FOR EPIDURAL ABSCESS;  Surgeon: Karn Cassis, MD;  Location: [facility];  Service: Neurosurgery;  Laterality: N/A;  . VULVAR LESION REMOVAL  01/01/2009   She had a 1-cm area of erythema to the right of the urethral meatus    There were no vitals filed for this visit.      Subjective Assessment - 09/22/16 1329    Subjective Pt denies falls or changes.  Still having a difficult time getting brace to stay in proper position.  No issues with exercises provided last session.   Patient is accompained by: --  [name] Weber   Patient Stated Goals Pt wants to do whatever she needs to do to make back stronger.   Currently in Pain? No/denies                         [facility] Adult PT Treatment/Exercise - 09/22/16 0001      Transfers   Transfers Sit to Stand;Stand to Sit   Sit to  Stand 5: Supervision;With upper extremity assist;From chair/3-in-1   Stand to Sit 5: Supervision;With upper extremity assist;To chair/3-in-1     Ambulation/Gait   Ambulation/Gait Yes   Ambulation/Gait Assistance 5: Supervision   Ambulation Distance (Feet) 110 Feet  x 4   Assistive device Rolling walker   Gait Pattern Step-through pattern;Decreased step length - right;Decreased step length - left;Decreased dorsiflexion - right;Decreased dorsiflexion - left;Right steppage;Right foot flat   Ambulation Surface Level;Indoor     High Level Balance   High Level Balance Activities Side stepping;Braiding;Backward walking;Other (comment)   High Level Balance Comments in Parallel bars performed above as well as SLS x 10 sec x 2 reps, tandem stance 10 sec x 2 each direction, tandem gait;also performed taps to 6" step with 1 UE assist, standing on blue foam with head turns/nods with 1 UE suport, standing on foam eyes closed with 1 UE     Self-Care   Self-Care Other Self-Care Comments   Other Self-Care Comments  Pt arrived again today with brace up too high.  continues to state that brace rides up and she can not get to fit properly.  Pt and friend report she has been back to [employer] and they state pt continues to need to adjust is during the day  as it rides up.  Had pt place brace as low as possible in sitting then lay supine and loosen brace to pull down even lower and retighten.  Brace appeared to stay in position during treatment session today.  Pt able to verbalize where brace should sit and the need to readjust it frequently.     Exercises   Exercises Knee/Hip     Knee/Hip Exercises: Standing   Other Standing Knee Exercises Performed standing hip straight leg flexion, marching, hip abduction, hip extension and hamstring curl alternating LE's x 15 reps with 2# weights.  Instructed pt to add 2# weights to these exercises at home (pt was performing these without weights at home)                 PT Education - 09/22/16 1536    Education provided Yes   Education Details proper fit of TLSO, HEP   Person(s) Educated Patient   Methods Explanation;Demonstration   Comprehension Verbalized understanding          PT Short Term Goals - 09/10/16 0921      PT SHORT TERM GOAL #1   Title Pt will be independent with HEP for improved strength, balance, and gait.  TARGET 10/09/16   Time 4   Period Weeks   Status New     PT SHORT TERM GOAL #2   Title Pt will improve TUG score to less than or equal to 20 seconds for decreased fall risk.   Time 4   Period Weeks   Status New     PT SHORT TERM GOAL #3   Title Pt will improve Berg Balance score to at least 24/56 for decreased fall risk.   Time 4   Period Weeks   Status New     PT SHORT TERM GOAL #4   Title Pt will perform at least 5 minutes of standing activities with intermittent UE support for improved participation in ADLs and household activities.   Time 4   Period Weeks   Status New           PT Long Term Goals - 09/10/16 0926      PT LONG TERM GOAL #1   Title Pt will verbalize understanding of fall prevention within the home environemnt.  TARGET 11/08/16   Time 8   Period Weeks   Status New     PT LONG TERM GOAL #2   Title Pt will improve TUG score to less than or equal to 17 seconds for decreased fall risk.   Time 8   Period Weeks   Status New     PT LONG TERM GOAL #3   Title Pt will improve Berg balance score to at least 29/56 for decreased fall risk.     PT LONG TERM GOAL #4   Title Pt will improve gait velocity to at least 2.4 ft/sec for improved gait efficiency and safety with household and community gait.   Time 8   Period Weeks   Status New     PT LONG TERM GOAL #5   Title Pt will verbalize understanding of transition to community fitness upon D/C from PT.   Time 8   Period Weeks   Status New               Plan - 09/22/16 1549    Clinical Impression Statement Pt continues with  decreased balance and decreased strength.  Continue PT per POC.   Rehab Potential Good  PT Frequency 2x / week   PT Duration 8 weeks  plus eval   PT Treatment/Interventions ADLs/Self Care Home Management;Functional mobility training;Gait training;DME Instruction;Therapeutic activities;Therapeutic exercise;Balance training;Neuromuscular re-education;Patient/family education;Orthotic Fit/Training   PT Next Visit Plan continue strengthening and balance   Consulted and Agree with Plan of Care Patient;Family member/caregiver   Family Member Consulted Neighbor, Vi      Patient will benefit from skilled therapeutic intervention in order to improve the following deficits and impairments:  Abnormal gait, Decreased balance, Decreased mobility, Decreased range of motion, Decreased safety awareness, Decreased strength, Difficulty walking, Impaired flexibility, Postural dysfunction  Visit Diagnosis: Other abnormalities of gait and mobility  Unsteadiness on feet  Muscle weakness (generalized)     Problem List Patient Active Problem List   Diagnosis Date Noted  . Memory loss   . Lumbar burst fracture (HCC) 06/08/2016  . Lumbar compression fracture (HCC) 06/08/2016  . Bilateral foot-drop   . Midline thoracic back pain   . PAF (paroxysmal atrial fibrillation) (HCC)   . Depression   . Constipation due to pain medication   . Chronic constipation   . Compression fracture of L2 (HCC) 05/30/2016  . Elevated blood pressure 05/30/2016  . L2 vertebral fracture (HCC) 05/30/2016  . Anxiety state 12/10/2014  . Neurogenic bladder 11/30/2014  . Neurogenic bowel 11/30/2014  . Bacterial UTI 11/30/2014  . Thoracic myelopathy 11/29/2014  . Persistent atrial fibrillation (HCC) 11/27/2014  . Traumatic spinal subdural hematoma   . Acute pulmonary embolism (HCC)   . Orthostatic hypotension 11/22/2014  . Near syncope 11/21/2014  . Paraplegia at T9 level (HCC) 11/17/2014  . Weakness of both legs   .  Nocturnal leg cramps 07/16/2014  . Encounter for therapeutic drug monitoring 12/25/2013  . Malaise and fatigue 06/01/2011  . Atrial flutter (HCC) 03/10/2011  . Long term (current) use of anticoagulants 03/10/2011  . Benign hypertensive heart disease without heart failure 03/10/2011  . Hypercholesterolemia 03/10/2011  . Paroxysmal atrial flutter (HCC)   . Palpitations   . PAC (premature atrial contraction)   . PVC's (premature ventricular contractions)   . Malaise   . Fatigue   . Myalgia     Newell Coral 09/22/2016, 3:51 PM  [facility] [address] [place], [place], [address] Phone: [phone]   Fax:  [phone]  Name: [name] Weber MRN: [id number] Date of Birth: 21-Nov-1931  Newell Coral, [facility] 09/22/16 3:51 PM Phone: [phone] Fax: [phone]

## 2016-09-24 ENCOUNTER — Ambulatory Visit: Payer: Medicare Other | Admitting: Physical Therapy

## 2016-09-29 ENCOUNTER — Ambulatory Visit: Payer: Medicare Other | Admitting: Physical Therapy

## 2016-09-30 ENCOUNTER — Telehealth: Payer: Self-pay | Admitting: Internal Medicine

## 2016-09-30 NOTE — Telephone Encounter (Signed)
New Message  Pt c/o swelling: STAT is pt has developed SOB within 24 hours  1. How long have you been experiencing swelling? Several Weeks  2. Where is the swelling located? Both ankles  3.  Are you currently taking a "fluid pill"? Not sure  4.  Are you currently SOB? No  5.  Have you traveled recently? No
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
## 2016-09-30 NOTE — Telephone Encounter (Signed)
Follow up  Pt c/o swelling: STAT is pt has developed SOB within 24 hours  1. How long have you been experiencing swelling? Several Weeks  2. Where is the swelling located? Both ankles  3.  Are you currently taking a "fluid pill"? Not sure  4.  Are you currently SOB? No  5.  Have you traveled recently? No

## 2016-10-01 ENCOUNTER — Telehealth: Payer: Self-pay

## 2016-10-01 NOTE — Telephone Encounter (Signed)
Via [name] (patients emergency contact) has left a messaging stating that [name] Weber is having a hard time with her back brace. She was wanting to know if possible, if there was an alternative other than the brace? Please Advise.

## 2016-10-02 ENCOUNTER — Ambulatory Visit: Payer: Medicare Other | Attending: Physical Medicine & Rehabilitation | Admitting: Physical Therapy

## 2016-10-02 DIAGNOSIS — R2681 Unsteadiness on feet: Secondary | ICD-10-CM

## 2016-10-02 DIAGNOSIS — M6281 Muscle weakness (generalized): Secondary | ICD-10-CM | POA: Diagnosis not present

## 2016-10-02 DIAGNOSIS — R2689 Other abnormalities of gait and mobility: Secondary | ICD-10-CM | POA: Insufficient documentation

## 2016-10-02 NOTE — Therapy (Signed)
[facility] [address] [place], [place], [address] Phone: [phone]   Fax:  [phone]  Physical Therapy Treatment  Patient Details  Name: [name] Weber MRN: [id number] Date of Birth: 1931/06/20 Referring Provider: Riley Kill  Encounter Date: 10/02/2016      PT End of Session - 10/02/16 1124    Visit Number 4   Number of Visits 17   Date for PT Re-Evaluation 11/08/16   Authorization Type UHC Medicare-GCODE every 10th visit   PT Start Time 1022   PT Stop Time 1106   PT Time Calculation (min) 44 min   Activity Tolerance Patient tolerated treatment well   Behavior During Therapy [facility] for tasks assessed/performed      Past Medical History:  Diagnosis Date  . Fatigue   . Malaise   . Myalgia   . PAC (premature atrial contraction)    ISOLATED  . Palpitations    OCCASSIONAL  . Paroxysmal atrial flutter (HCC)   . PVC's (premature ventricular contractions)    ISOLATED    Past Surgical History:  Procedure Laterality Date  . THORACIC LAMINECTOMY FOR EPIDURAL ABSCESS N/A 11/16/2014   Procedure: THORACIC LAMINECTOMY FOR EPIDURAL ABSCESS;  Surgeon: Karn Cassis, MD;  Location: [facility];  Service: Neurosurgery;  Laterality: N/A;  . VULVAR LESION REMOVAL  01/01/2009   She had a 1-cm area of erythema to the right of the urethral meatus    There were no vitals filed for this visit.      Subjective Assessment - 10/02/16 1025    Subjective Still having a difficult time getting brace to stay in proper position.  It rides up throughout the day.  Have good days and bad days as far as strength.   Patient is accompained by: --  Cloria Spring   Patient Stated Goals Pt wants to do whatever she needs to do to make back stronger.   Currently in Pain? No/denies                         [facility] Adult PT Treatment/Exercise - 10/02/16 0001      Transfers   Transfers Sit to Stand;Stand to Sit   Sit to  Stand 5: Supervision;With upper extremity assist;From chair/3-in-1   Stand to Sit 5: Supervision;With upper extremity assist;To chair/3-in-1   Comments multiple reps throughout session, due to brief seated rest breaks     High Level Balance   High Level Balance Activities Side stepping;Backward walking;Other (comment)  Forwards walking, resisted sidestepping   High Level Balance Comments in parallel bars, 3-4 reps of 10 ft each direction with bilateral UE support     Self-Care   Self-Care Other Self-Care Comments   Other Self-Care Comments  Pt arrived again today with brace up too high.  continues to state that brace rides up and she can not get to fit properly. Assisted pt in repositioning brace in supine, by lowering brace into proper position, with therapist tightening brace.  Brace does not appear to change positions or ride up significantly during PT session.  Pt would benefit from additional practice of re-adjusting brace in sitting with tightening as much as possible in supine fro optimal positioning.     Exercises   Exercises Knee/Hip     Knee/Hip Exercises: Standing   Functional Squat 1 set;10 reps  in parallel bars, cues for positioning   Other Standing Knee Exercises Performed standing hip straight leg flexion, marching, hip  abduction, hip extension and hamstring curl 2 sets of 10 reps with 2# weights.  (Pt has not yet added weights to standing exercises at home.             Balance Exercises - 10/02/16 1116      Balance Exercises: Standing   Standing Eyes Opened Narrow base of support (BOS);Wide (BOA);Foam/compliant surface;Solid surface;2 reps;30 secs;Head turns  min guard/supervision; also head nods   Standing Eyes Closed Wide (BOA);Foam/compliant surface;Solid surface;2 reps;30 secs  min guard/supervision   Other Standing Exercises Standing on foam surface marching in place x 10, forward kicks x 10, alternating legs with UE support      Pt needs several seated  rest breaks during PT session due to fatigue.       PT Short Term Goals - 09/10/16 [id number]      PT SHORT TERM GOAL #1   Title Pt will be independent with HEP for improved strength, balance, and gait.  TARGET 10/09/16   Time 4   Period Weeks   Status New     PT SHORT TERM GOAL #2   Title Pt will improve TUG score to less than or equal to 20 seconds for decreased fall risk.   Time 4   Period Weeks   Status New     PT SHORT TERM GOAL #3   Title Pt will improve Berg Balance score to at least 24/56 for decreased fall risk.   Time 4   Period Weeks   Status New     PT SHORT TERM GOAL #4   Title Pt will perform at least 5 minutes of standing activities with intermittent UE support for improved participation in ADLs and household activities.   Time 4   Period Weeks   Status New           PT Long Term Goals - 09/10/16 0926      PT LONG TERM GOAL #1   Title Pt will verbalize understanding of fall prevention within the home environemnt.  TARGET 11/08/16   Time 8   Period Weeks   Status New     PT LONG TERM GOAL #2   Title Pt will improve TUG score to less than or equal to 17 seconds for decreased fall risk.   Time 8   Period Weeks   Status New     PT LONG TERM GOAL #3   Title Pt will improve Berg balance score to at least 29/56 for decreased fall risk.     PT LONG TERM GOAL #4   Title Pt will improve gait velocity to at least 2.4 ft/sec for improved gait efficiency and safety with household and community gait.   Time 8   Period Weeks   Status New     PT LONG TERM GOAL #5   Title Pt will verbalize understanding of transition to community fitness upon D/C from PT.   Time 8   Period Weeks   Status New               Plan - 10/02/16 1124    Clinical Impression Statement Pt appears to feel frustrated at continuing to have brace repositioning issues as well as fluctuations in strength from day to day.  Pt continues to need UE support for most standing  strengthening and balance activities, as well as postural cues for upright posture.  Pt will continue to benefit from skilled PT to address balance, strengthening and gait.   Rehab Potential Good  PT Frequency 2x / week   PT Duration 8 weeks  plus eval   PT Treatment/Interventions ADLs/Self Care Home Management;Functional mobility training;Gait training;DME Instruction;Therapeutic activities;Therapeutic exercise;Balance training;Neuromuscular re-education;Patient/family education;Orthotic Fit/Training   PT Next Visit Plan continue strengthening and balance, gait activities; will need to assess STGs and likely add more appointments   Consulted and Agree with Plan of Care Patient;Family member/caregiver   Family Member Consulted Neighbor, Vi      Patient will benefit from skilled therapeutic intervention in order to improve the following deficits and impairments:  Abnormal gait, Decreased balance, Decreased mobility, Decreased range of motion, Decreased safety awareness, Decreased strength, Difficulty walking, Impaired flexibility, Postural dysfunction  Visit Diagnosis: Muscle weakness (generalized)  Unsteadiness on feet     Problem List Patient Active Problem List   Diagnosis Date Noted  . Memory loss   . Lumbar burst fracture (HCC) 06/08/2016  . Lumbar compression fracture (HCC) 06/08/2016  . Bilateral foot-drop   . Midline thoracic back pain   . PAF (paroxysmal atrial fibrillation) (HCC)   . Depression   . Constipation due to pain medication   . Chronic constipation   . Compression fracture of L2 (HCC) 05/30/2016  . Elevated blood pressure 05/30/2016  . L2 vertebral fracture (HCC) 05/30/2016  . Anxiety state 12/10/2014  . Neurogenic bladder 11/30/2014  . Neurogenic bowel 11/30/2014  . Bacterial UTI 11/30/2014  . Thoracic myelopathy 11/29/2014  . Persistent atrial fibrillation (HCC) 11/27/2014  . Traumatic spinal subdural hematoma   . Acute pulmonary embolism (HCC)   .  Orthostatic hypotension 11/22/2014  . Near syncope 11/21/2014  . Paraplegia at T9 level (HCC) 11/17/2014  . Weakness of both legs   . Nocturnal leg cramps 07/16/2014  . Encounter for therapeutic drug monitoring 12/25/2013  . Malaise and fatigue 06/01/2011  . Atrial flutter (HCC) 03/10/2011  . Long term (current) use of anticoagulants 03/10/2011  . Benign hypertensive heart disease without heart failure 03/10/2011  . Hypercholesterolemia 03/10/2011  . Paroxysmal atrial flutter (HCC)   . Palpitations   . PAC (premature atrial contraction)   . PVC's (premature ventricular contractions)   . Malaise   . Fatigue   . Myalgia     Abubakr Wieman W. 10/02/2016, 11:29 AM  Fredia SorrowMARRIOTT,Arrion Broaddus W. [facility] [address] [place], [place], [address] Phone: [phone]   Fax:  [phone]  Name: [name] Weber MRN: [id number] Date of Birth: 08/30/1931

## 2016-10-05 ENCOUNTER — Ambulatory Visit: Payer: Medicare Other | Admitting: Physical Therapy

## 2016-10-05 DIAGNOSIS — R2689 Other abnormalities of gait and mobility: Secondary | ICD-10-CM

## 2016-10-06 ENCOUNTER — Ambulatory Visit: Payer: Medicare Other | Admitting: Physical Therapy

## 2016-10-06 NOTE — Telephone Encounter (Signed)
I spoke with Ms [name] Weber.  [name] Weber is coming in to see Dr [name] this week and can discuss then.

## 2016-10-06 NOTE — Telephone Encounter (Signed)
i'm pretty sure that it was fitted by [employer]. She would probably be best served talking to surgeon as to how long she needs the brace first. If it's going to be needed for an extended time, then [employer] could re-fit, or try a different type of brace perhaps.

## 2016-10-06 NOTE — Therapy (Signed)
[facility] [address] [place], [place], [address] Phone: [phone]   Fax:  [phone]  Patient Details  Name: [name] Weber MRN: [id number] Date of Birth: 11/26/1931 Referring Provider:  Marden NobleGates, Robert, MD  Encounter Date: 10/05/2016  Pt presents today to therapy not wearing her TLSO brace.  She initially says that she will not be doing any bending, lifting, twisting, so she figured it would be okay not to wear it.  At end of time in PT gym, she states she just forgot to put it on.  PT spoke to patient and to neighbor, Vi, about last written information that PT has is from Dr. [name] for patient to wear TLSO when out of bed and to defer to neurosurgery for further weaning instructions on TLSO.  Vi reports not having spoken directly to Dr. Jeral FruitBotero recently about definitively progressing away from brace (originally she says he said okay to d/c after 90 days, which was end of October).  To seek clarification, PT phoned Dr. Cassandria SanteeBotero's office and left message regarding instructions as to whether pt needs to continue wearing brace.  Did not progress further with PT session today.  Advik Weatherspoon W. 10/06/2016, 12:19 PM  Gean MaidensMARRIOTT,Rayleigh Gillyard W., PT  [facility] [address] [place], [place], [address] Phone: [phone]   Fax:  [phone]

## 2016-10-07 ENCOUNTER — Encounter: Payer: Self-pay | Admitting: Physical Medicine & Rehabilitation

## 2016-10-07 ENCOUNTER — Encounter: Payer: Medicare Other | Attending: Physical Medicine & Rehabilitation | Admitting: Physical Medicine & Rehabilitation

## 2016-10-07 ENCOUNTER — Ambulatory Visit: Payer: Medicare Other | Admitting: Physical Therapy

## 2016-10-07 VITALS — BP 118/73 | HR 75 | Resp 14

## 2016-10-07 DIAGNOSIS — G839 Paralytic syndrome, unspecified: Secondary | ICD-10-CM

## 2016-10-07 DIAGNOSIS — S32029A Unspecified fracture of second lumbar vertebra, initial encounter for closed fracture: Secondary | ICD-10-CM | POA: Diagnosis present

## 2016-10-07 DIAGNOSIS — S32020S Wedge compression fracture of second lumbar vertebra, sequela: Secondary | ICD-10-CM | POA: Diagnosis not present

## 2016-10-07 DIAGNOSIS — M6281 Muscle weakness (generalized): Secondary | ICD-10-CM | POA: Diagnosis not present

## 2016-10-07 DIAGNOSIS — R2681 Unsteadiness on feet: Secondary | ICD-10-CM

## 2016-10-07 DIAGNOSIS — Z86718 Personal history of other venous thrombosis and embolism: Secondary | ICD-10-CM | POA: Insufficient documentation

## 2016-10-07 DIAGNOSIS — Z87891 Personal history of nicotine dependence: Secondary | ICD-10-CM | POA: Insufficient documentation

## 2016-10-07 DIAGNOSIS — Z5189 Encounter for other specified aftercare: Secondary | ICD-10-CM | POA: Insufficient documentation

## 2016-10-07 DIAGNOSIS — Z7901 Long term (current) use of anticoagulants: Secondary | ICD-10-CM | POA: Diagnosis not present

## 2016-10-07 DIAGNOSIS — I481 Persistent atrial fibrillation: Secondary | ICD-10-CM | POA: Insufficient documentation

## 2016-10-07 DIAGNOSIS — R2689 Other abnormalities of gait and mobility: Secondary | ICD-10-CM

## 2016-10-07 DIAGNOSIS — G8220 Paraplegia, unspecified: Secondary | ICD-10-CM
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
## 2016-10-07 NOTE — Patient Instructions (Addendum)
    AQUATIC EXERCISES

## 2016-10-07 NOTE — Therapy (Signed)
[facility] [address] [place], [place], [address] Phone: [phone]   Fax:  [phone]  Physical Therapy Treatment  Patient Details  Name: [name] Weber MRN: [id number] Date of Birth: 1930-12-20 Referring Provider: Naaman Plummer  Encounter Date: 10/07/2016      PT End of Session - 10/07/16 1210    Visit Number 5   Number of Visits 17   Date for PT Re-Evaluation 11/08/16   Authorization Type UHC Medicare-GCODE every 10th visit   PT Start Time 0932   PT Stop Time 1015   PT Time Calculation (min) 43 min   Equipment Utilized During Treatment Gait belt   Activity Tolerance Patient tolerated treatment well   Behavior During Therapy [facility] for tasks assessed/performed      Past Medical History:  Diagnosis Date  . Fatigue   . Malaise   . Myalgia   . PAC (premature atrial contraction)    ISOLATED  . Palpitations    OCCASSIONAL  . Paroxysmal atrial flutter ([place])   . PVC's (premature ventricular contractions)    ISOLATED    Past Surgical History:  Procedure Laterality Date  . THORACIC LAMINECTOMY FOR EPIDURAL ABSCESS N/A 11/16/2014   Procedure: THORACIC LAMINECTOMY FOR EPIDURAL ABSCESS;  Surgeon: Floyce Stakes, MD;  Location: [facility];  Service: Neurosurgery;  Laterality: N/A;  . VULVAR LESION REMOVAL  01/01/2009   She had a 1-cm area of erythema to the right of the urethral meatus    There were no vitals filed for this visit.      Subjective Assessment - 10/07/16 0935    Subjective PT shared with pt conversation from Dr. Harley Hallmark office yesterday-pt was to wear TLSO for 3 weeks after 07/31/16, and then discharge TLSO.  Pt verbalizes understanding.   Patient is accompained by: --  [name] Weber   Patient Stated Goals Pt wants to do whatever she needs to do to make back stronger.   Currently in Pain? No/denies                         [facility] Adult PT Treatment/Exercise - 10/07/16 0941      Standardized Balance Assessment   Standardized Balance Assessment Timed Up and Go Test;Berg Balance Test     Berg Balance Test   Sit to Stand Able to stand  independently using hands   Standing Unsupported Able to stand 30 seconds unsupported  1:32 sec without support   Sitting with Back Unsupported but Feet Supported on Floor or Stool Able to sit safely and securely 2 minutes   Stand to Sit Controls descent by using hands   Transfers Able to transfer safely, definite need of hands   Standing Unsupported with Eyes Closed Able to stand 10 seconds with supervision   Standing Ubsupported with Feet Together Needs help to attain position but able to stand for 30 seconds with feet together   From Standing, Reach Forward with Outstretched Arm Loses balance while trying/requires external support   From Standing Position, Pick up Object from Floor Unable to try/needs assist to keep balance   From Standing Position, Turn to Look Behind Over each Shoulder Needs assist to keep from losing balance and falling   Turn 360 Degrees Needs assistance while turning   Standing Unsupported, Alternately Place Feet on Step/Stool Needs assistance to keep from falling or unable to try   Standing Unsupported, One Foot in Front Needs help to step but can hold  15 seconds   Standing on One Leg Unable to try or needs assist to prevent fall   Total Score 20     Timed Up and Go Test   TUG Normal TUG   Normal TUG (seconds) 19.56     Self-Care   Self-Care Other Self-Care Comments   Other Self-Care Comments  Discussed with patient and friend, [name] Weber, phone call from Dr. Harley Hallmark office, stating no longer needing to wear TLSO (Dr. Harley Hallmark office states as of 08/10/16, pt needed to wear TLSO for 3 more weeks and no longer needs to wear).  Discussed progress towards goals, POC and need to make more appointments.     Neuro Re-ed    Neuro Re-ed Details  Seated core stability and balance activities-on mat surface and on green  therapy disc:  rhythmic stabilization given anter/posterior directions at shoulders and extended arms with cues for abdominal activation and upright posture.  Seated on green balance disc-alternating UE lifts, then bilateral UE lifts with cues for abdominal activation.             Balance Exercises - 10/07/16 1201      Balance Exercises: Standing   Standing Eyes Opened Narrow base of support (BOS);Wide (BOA);Head turns;Solid surface  Head nods x 8 reps each, 1 UE support   Standing, One Foot on a Step Eyes open;6 inch;Other reps (comment)  10 reps, alternating feet, also 12 inch step, BUE support   Step Ups Forward;6 inch;UE support 2    Step-ups performed in step up/up-down/down methods x 10 reps each leg with UE support (cues for upright posture)       PT Education - 10/07/16 1209    Education provided Yes   Education Details Educated on update on TLSO per Dr. Samuel Germany not need to wear at all times; discussed POC and progress towards goals; reviewed back precautions-avoid bending/arching/twisting   Person(s) Educated Patient;Other (comment)  [name] Weber   Methods Explanation   Comprehension Verbalized understanding          PT Short Term Goals - 10/07/16 0937      PT SHORT TERM GOAL #1   Title Pt will be independent with HEP for improved strength, balance, and gait.  TARGET 10/09/16   Time 4   Period Weeks   Status Achieved     PT SHORT TERM GOAL #2   Title Pt will improve TUG score to less than or equal to 20 seconds for decreased fall risk.   Baseline 19.56 sec with RW on TUG 10/07/16   Time 4   Period Weeks   Status Achieved     PT SHORT TERM GOAL #3   Title Pt will improve Berg Balance score to at least 24/56 for decreased fall risk.   Time 4   Period Weeks   Status Not Met     PT SHORT TERM GOAL #4   Title Pt will perform at least 5 minutes of standing activities with intermittent UE support for improved participation in ADLs and household  activities.   Time 4   Period Weeks   Status Achieved           PT Long Term Goals - 09/10/16 5859      PT LONG TERM GOAL #1   Title Pt will verbalize understanding of fall prevention within the home environemnt.  TARGET 11/08/16   Time 8   Period Weeks   Status New     PT LONG TERM GOAL #2  Title Pt will improve TUG score to less than or equal to 17 seconds for decreased fall risk.   Time 8   Period Weeks   Status New     PT LONG TERM GOAL #3   Title Pt will improve Berg balance score to at least 29/56 for decreased fall risk.     PT LONG TERM GOAL #4   Title Pt will improve gait velocity to at least 2.4 ft/sec for improved gait efficiency and safety with household and community gait.   Time 8   Period Weeks   Status New     PT LONG TERM GOAL #5   Title Pt will verbalize understanding of transition to community fitness upon D/C from PT.   Time 8   Period Weeks   Status New               Plan - 10/07/16 1211    Clinical Impression Statement After following up with Dr. Harley Hallmark office, discussed with patient, that she no longer has to wear the brace at all times (discharged as of 3 weeks from 08/10/16-PT was unaware of this until phone call yesterday).  Checked STGs-pt has met STG 1, 2, 4.  STG # 3 for Berg not met, with pt scoring 20/56.  Pt has missed some therapy visits due to scheduling and transportation conflicts, so this may be contributing to progress.  Pt tolerates session today without brace without c/o pain.  Pt will continue to benefit from further skilled PT to address balance, strength, and gait.   Rehab Potential Good   PT Frequency 2x / week   PT Duration 8 weeks  plus eval   PT Treatment/Interventions ADLs/Self Care Home Management;Functional mobility training;Gait training;DME Instruction;Therapeutic activities;Therapeutic exercise;Balance training;Neuromuscular re-education;Patient/family education;Orthotic Fit/Training   PT Next Visit Plan  Work on core stability, lower extremity strengthening, standing balance, gait activities   Consulted and Agree with Plan of Care Patient;Family member/caregiver   Family Member Consulted [name] Weber      Patient will benefit from skilled therapeutic intervention in order to improve the following deficits and impairments:  Abnormal gait, Decreased balance, Decreased mobility, Decreased range of motion, Decreased safety awareness, Decreased strength, Difficulty walking, Impaired flexibility, Postural dysfunction  Visit Diagnosis: Muscle weakness (generalized)  Unsteadiness on feet  Other abnormalities of gait and mobility     Problem List Patient Active Problem List   Diagnosis Date Noted  . Memory loss   . Lumbar burst fracture ([place]) 06/08/2016  . Lumbar compression fracture ([place]) 06/08/2016  . Bilateral foot-drop   . Midline thoracic back pain   . PAF (paroxysmal atrial fibrillation) ([place])   . Depression   . Constipation due to pain medication   . Chronic constipation   . Compression fracture of L2 ([place]) 05/30/2016  . Elevated blood pressure 05/30/2016  . L2 vertebral fracture ([place]) 05/30/2016  . Anxiety state 12/10/2014  . Neurogenic bladder 11/30/2014  . Neurogenic bowel 11/30/2014  . Bacterial UTI 11/30/2014  . Thoracic myelopathy 11/29/2014  . Persistent atrial fibrillation ([place]) 11/27/2014  . Traumatic spinal subdural hematoma   . Acute pulmonary embolism ([place])   . Orthostatic hypotension 11/22/2014  . Near syncope 11/21/2014  . Paraplegia at T9 level ([place]) 11/17/2014  . Weakness of both legs   . Nocturnal leg cramps 07/16/2014  . Encounter for therapeutic drug monitoring 12/25/2013  . Malaise and fatigue 06/01/2011  . Atrial flutter ([place]) 03/10/2011  . Long term (current) use of anticoagulants 03/10/2011  .  Benign hypertensive heart disease without heart failure 03/10/2011  . Hypercholesterolemia 03/10/2011  . Paroxysmal atrial flutter ([place])   .  Palpitations   . PAC (premature atrial contraction)   . PVC's (premature ventricular contractions)   . Malaise   . Fatigue   . Myalgia     [name] Weber W. 10/07/2016, 12:17 PM  Frazier Butt., PT  [facility] [address] [place], [place], [address] Phone: [phone]   Fax:  [phone]  Name: [name] Weber MRN: [id number] Date of Birth: [birth year]

## 2016-10-12 ENCOUNTER — Ambulatory Visit: Payer: Medicare Other | Admitting: Physical Therapy

## 2016-10-12 DIAGNOSIS — M6281 Muscle weakness (generalized): Secondary | ICD-10-CM

## 2016-10-12 DIAGNOSIS — R2689 Other abnormalities of gait and mobility: Secondary | ICD-10-CM

## 2016-10-12 DIAGNOSIS — R2681 Unsteadiness on feet: Secondary | ICD-10-CM

## 2016-10-13 NOTE — Therapy (Signed)
[facility] [address] [place], [place], [address] Phone: [phone]   Fax:  [phone]  Physical Therapy Treatment  Patient Details  Name: [name] Weber MRN: [id number] Date of Birth: [birth year] Referring Provider: Naaman Plummer  Encounter Date: 10/12/2016      PT End of Session - 10/13/16 1856    Visit Number 6   Number of Visits 17   Date for PT Re-Evaluation 11/08/16   Authorization Type UHC Medicare-GCODE every 10th visit   PT Start Time 1108   PT Stop Time 1148   PT Time Calculation (min) 40 min   Equipment Utilized During Treatment Gait belt   Activity Tolerance Patient tolerated treatment well   Behavior During Therapy [facility] for tasks assessed/performed      Past Medical History:  Diagnosis Date  . Fatigue   . Malaise   . Myalgia   . PAC (premature atrial contraction)    ISOLATED  . Palpitations    OCCASSIONAL  . Paroxysmal atrial flutter ([place])   . PVC's (premature ventricular contractions)    ISOLATED    Past Surgical History:  Procedure Laterality Date  . THORACIC LAMINECTOMY FOR EPIDURAL ABSCESS N/A 11/16/2014   Procedure: THORACIC LAMINECTOMY FOR EPIDURAL ABSCESS;  Surgeon: Floyce Stakes, MD;  Location: [facility];  Service: Neurosurgery;  Laterality: N/A;  . VULVAR LESION REMOVAL  01/01/2009   She had a 1-cm area of erythema to the right of the urethral meatus    There were no vitals filed for this visit.      Subjective Assessment - 10/12/16 1111    Subjective No changes from last visit.  No pain today.     Patient is accompained by: --  Charlesetta Ivory   Patient Stated Goals Pt wants to do whatever she needs to do to make back stronger.   Currently in Pain? No/denies                         [facility] Adult PT Treatment/Exercise - 10/13/16 0001      Ambulation/Gait   Ambulation/Gait Yes   Ambulation/Gait Assistance 5: Supervision   Ambulation/Gait Assistance  Details Cues for increased step length, increased heelstrike to decrease steppage gait.   Ambulation Distance (Feet) 350 Feet  then 200   Assistive device Rolling walker   Gait Pattern Step-through pattern;Decreased step length - right;Decreased step length - left;Decreased dorsiflexion - right;Decreased dorsiflexion - left;Right steppage;Right foot flat   Ambulation Surface Level;Indoor     Neuro Re-ed    Neuro Re-ed Details  Seated core stability and balance activities-on mat surface and on green therapy disc:  rhythmic stabilization given anter/posterior directions at shoulders and extended arms with cues for abdominal activation and upright posture.  Seated on green balance disc-alternating UE lifts, then bilateral UE lifts, then seated march, LAQ, then opposite arm/leg lifts with cues for abdominal activation.             Balance Exercises - 10/13/16 1846      Balance Exercises: Standing   Standing Eyes Opened Narrow base of support (BOS);Wide (BOA);Head turns;Solid surface  Head nods, UE support   Standing, One Foot on a Step Eyes open;6 inch;Other reps (comment)  6" and 12" step:  alternating step taps x 10 reps UE support   Stepping Strategy Anterior;Lateral;UE support;10 reps  Stepping over obstacle    Step Ups Forward;6 inch;UE support 2  Up/up-down/down x 10 reps  Retro Gait Upper extremity support;3 reps  /forward gait in parallel bars for improved step length   Other Standing Exercises Standing on foam surface marching in place x 10, forward kicks x 10, alternating legs with UE support.    Overall Comments --  Min guard assist provided throughout      Therapeutic Exercise: Supine posterior pelvic tilts x 5 reps with cues for abdominal activation.  Hooklying marching x 10 reps, then hooklying hip abduction and adduction x 10 reps with cues for abdominal activation.        PT Short Term Goals - 10/07/16 0981      PT SHORT TERM GOAL #1   Title Pt will be  independent with HEP for improved strength, balance, and gait.  TARGET 10/09/16   Time 4   Period Weeks   Status Achieved     PT SHORT TERM GOAL #2   Title Pt will improve TUG score to less than or equal to 20 seconds for decreased fall risk.   Baseline 19.56 sec with RW on TUG 10/07/16   Time 4   Period Weeks   Status Achieved     PT SHORT TERM GOAL #3   Title Pt will improve Berg Balance score to at least 24/56 for decreased fall risk.   Time 4   Period Weeks   Status Not Met     PT SHORT TERM GOAL #4   Title Pt will perform at least 5 minutes of standing activities with intermittent UE support for improved participation in ADLs and household activities.   Time 4   Period Weeks   Status Achieved           PT Long Term Goals - 09/10/16 1914      PT LONG TERM GOAL #1   Title Pt will verbalize understanding of fall prevention within the home environemnt.  TARGET 11/08/16   Time 8   Period Weeks   Status New     PT LONG TERM GOAL #2   Title Pt will improve TUG score to less than or equal to 17 seconds for decreased fall risk.   Time 8   Period Weeks   Status New     PT LONG TERM GOAL #3   Title Pt will improve Berg balance score to at least 29/56 for decreased fall risk.     PT LONG TERM GOAL #4   Title Pt will improve gait velocity to at least 2.4 ft/sec for improved gait efficiency and safety with household and community gait.   Time 8   Period Weeks   Status New     PT LONG TERM GOAL #5   Title Pt will verbalize understanding of transition to community fitness upon D/C from PT.   Time 8   Period Weeks   Status New               Plan - 10/13/16 1857    Clinical Impression Statement Pt continues to be motivated for therapy activities, has had no problems with weaning off brace since last therapy session.  Pt continues to need UE support for standing balance activities, but overall tolerates without fatigue and pain.  Pt will continue to benefit from  skilled PT to address balance, strength, and gait.   Rehab Potential Good   PT Frequency 2x / week   PT Duration 8 weeks  plus eval   PT Treatment/Interventions ADLs/Self Care Home Management;Functional mobility training;Gait training;DME Instruction;Therapeutic activities;Therapeutic exercise;Balance training;Neuromuscular  re-education;Patient/family education;Orthotic Fit/Training   PT Next Visit Plan Continue to work on core stability, lower extremity strengthening, standing balance, gait activities   Consulted and Agree with Plan of Care Patient;Family member/caregiver   Family Member Consulted Vi      Patient will benefit from skilled therapeutic intervention in order to improve the following deficits and impairments:  Abnormal gait, Decreased balance, Decreased mobility, Decreased range of motion, Decreased safety awareness, Decreased strength, Difficulty walking, Impaired flexibility, Postural dysfunction  Visit Diagnosis: Unsteadiness on feet  Muscle weakness (generalized)  Other abnormalities of gait and mobility     Problem List Patient Active Problem List   Diagnosis Date Noted  . Memory loss   . Lumbar burst fracture ([place]) 06/08/2016  . Lumbar compression fracture ([place]) 06/08/2016  . Bilateral foot-drop   . Midline thoracic back pain   . PAF (paroxysmal atrial fibrillation) ([place])   . Depression   . Constipation due to pain medication   . Chronic constipation   . Compression fracture of L2 ([place]) 05/30/2016  . Elevated blood pressure 05/30/2016  . L2 vertebral fracture ([place]) 05/30/2016  . Anxiety state 12/10/2014  . Neurogenic bladder 11/30/2014  . Neurogenic bowel 11/30/2014  . Bacterial UTI 11/30/2014  . Thoracic myelopathy 11/29/2014  . Persistent atrial fibrillation ([place]) 11/27/2014  . Traumatic spinal subdural hematoma   . Acute pulmonary embolism ([place])   . Orthostatic hypotension 11/22/2014  . Near syncope 11/21/2014  . Paraplegia at T9 level ([place])  11/17/2014  . Weakness of both legs   . Nocturnal leg cramps 07/16/2014  . Encounter for therapeutic drug monitoring 12/25/2013  . Malaise and fatigue 06/01/2011  . Atrial flutter ([place]) 03/10/2011  . Long term (current) use of anticoagulants 03/10/2011  . Benign hypertensive heart disease without heart failure 03/10/2011  . Hypercholesterolemia 03/10/2011  . Paroxysmal atrial flutter ([place])   . Palpitations   . PAC (premature atrial contraction)   . PVC's (premature ventricular contractions)   . Malaise   . Fatigue   . Myalgia     Chamar Broughton W. 10/13/2016, 7:02 PM  Frazier Butt., PT  [facility] [address] [place], [place], [address] Phone: [phone]   Fax:  [phone]  Name: [name] Weber MRN: [id number] Date of Birth: 03-Mar-1931

## 2016-10-14 ENCOUNTER — Ambulatory Visit: Payer: Medicare Other | Admitting: Physical Therapy

## 2016-10-19 ENCOUNTER — Ambulatory Visit: Payer: Medicare Other | Admitting: Physical Therapy

## 2016-10-19 ENCOUNTER — Ambulatory Visit: Payer: Medicare Other | Admitting: Internal Medicine

## 2016-10-19 DIAGNOSIS — M6281 Muscle weakness (generalized): Secondary | ICD-10-CM | POA: Diagnosis not present

## 2016-10-19 DIAGNOSIS — R2689 Other abnormalities of gait and mobility: Secondary | ICD-10-CM

## 2016-10-19 DIAGNOSIS — R2681 Unsteadiness on feet: Secondary | ICD-10-CM

## 2016-10-19 NOTE — Therapy (Signed)
[facility] [address] [place], [place], [address] Phone: [phone]   Fax:  [phone]  Physical Therapy Treatment  Patient Details  Name: [name] Weber MRN: [id number] Date of Birth: 1931-03-18 Referring Provider: Naaman Plummer  Encounter Date: 10/19/2016      PT End of Session - 10/19/16 2235    Visit Number 7   Number of Visits 17   Date for PT Re-Evaluation 11/08/16   Authorization Type UHC Medicare-GCODE every 10th visit   PT Start Time 1104   PT Stop Time 1145   PT Time Calculation (min) 41 min   Equipment Utilized During Treatment Gait belt   Activity Tolerance Patient tolerated treatment well   Behavior During Therapy [facility] for tasks assessed/performed      Past Medical History:  Diagnosis Date  . Fatigue   . Malaise   . Myalgia   . PAC (premature atrial contraction)    ISOLATED  . Palpitations    OCCASSIONAL  . Paroxysmal atrial flutter ([place])   . PVC's (premature ventricular contractions)    ISOLATED    Past Surgical History:  Procedure Laterality Date  . THORACIC LAMINECTOMY FOR EPIDURAL ABSCESS N/A 11/16/2014   Procedure: THORACIC LAMINECTOMY FOR EPIDURAL ABSCESS;  Surgeon: Floyce Stakes, MD;  Location: [facility];  Service: Neurosurgery;  Laterality: N/A;  . VULVAR LESION REMOVAL  01/01/2009   She had a 1-cm area of erythema to the right of the urethral meatus    There were no vitals filed for this visit.      Subjective Assessment - 10/19/16 1108    Subjective No changes; not wearing the brace at all anymore.   Patient is accompained by: --  Friend, Vi   Patient Stated Goals Pt wants to do whatever she needs to do to make back stronger.   Currently in Pain? No/denies                     Therapeutic Exercise: for core stability and strengthening     [facility] Adult PT Treatment/Exercise - 10/19/16 0001      Neuro Re-ed    Neuro Re-ed Details  Seated core  stability and balance activities-on green therapy disc:  alternating UE lifts, then bilateral UE lifts, gentle UE lifts with slight trunk rotation, then seated march, LAQ, then opposite arm/leg lifts with cues for abdominal activation.  Standing wall squats x 10 reps cue for abdom acitvation     Seated on blue large therapy ball with min-mod assist from therapist-UE activities as above on green disc, seated marching, seated LAQ x 10 reps each, seated anterior/posterior weightshifting x 10 reps, with cues for abdominal activation.        Balance Exercises - 10/19/16 1120      Balance Exercises: Standing   Standing Eyes Opened Narrow base of support (BOS);Wide (BOA);Head turns;Solid surface;Foam/compliant surface;5 reps  Head nods   Standing Eyes Closed Wide (BOA);Narrow base of support (BOS);Solid surface;Foam/compliant surface;10 secs  intermittent UE support of chair   Standing, One Foot on a Step Eyes open;6 inch;Other reps (comment)  6", 12" step, alternating legs with UE support   Stepping Strategy Anterior;Lateral;UE support;10 reps  stepping over obstacle   Step Ups Forward;6 inch;UE support 2  single leg step ups x 15 reps   Retro Gait Upper extremity support;3 reps  /forward walking in parallel bars   Marching Limitations Forward marching in parallel bars 3 reps with UE support  Other Standing Exercises Standing on foam surface marching in place x 10, forward kicks x 10, alternating legs with UE support.  Side-step over and back with obstacle, x 10 reps each direction UE support   Overall Comments --  Cues provided for abdom activation, lessening UE support             PT Short Term Goals - 10/07/16 3536      PT SHORT TERM GOAL #1   Title Pt will be independent with HEP for improved strength, balance, and gait.  TARGET 10/09/16   Time 4   Period Weeks   Status Achieved     PT SHORT TERM GOAL #2   Title Pt will improve TUG score to less than or equal to 20  seconds for decreased fall risk.   Baseline 19.56 sec with RW on TUG 10/07/16   Time 4   Period Weeks   Status Achieved     PT SHORT TERM GOAL #3   Title Pt will improve Berg Balance score to at least 24/56 for decreased fall risk.   Time 4   Period Weeks   Status Not Met     PT SHORT TERM GOAL #4   Title Pt will perform at least 5 minutes of standing activities with intermittent UE support for improved participation in ADLs and household activities.   Time 4   Period Weeks   Status Achieved           PT Long Term Goals - 09/10/16 1443      PT LONG TERM GOAL #1   Title Pt will verbalize understanding of fall prevention within the home environemnt.  TARGET 11/08/16   Time 8   Period Weeks   Status New     PT LONG TERM GOAL #2   Title Pt will improve TUG score to less than or equal to 17 seconds for decreased fall risk.   Time 8   Period Weeks   Status New     PT LONG TERM GOAL #3   Title Pt will improve Berg balance score to at least 29/56 for decreased fall risk.     PT LONG TERM GOAL #4   Title Pt will improve gait velocity to at least 2.4 ft/sec for improved gait efficiency and safety with household and community gait.   Time 8   Period Weeks   Status New     PT LONG TERM GOAL #5   Title Pt will verbalize understanding of transition to community fitness upon D/C from PT.   Time 8   Period Weeks   Status New               Plan - 10/19/16 2235    Clinical Impression Statement Pt appears more upright and steady during PT activities today, with pt able to lessen UE support to 1 hand at times for balance activities.  Pt continues to need UE support for more challenging balance activities and with gait.  Pt will continue to benefit from further skilled PT to address balance, strengthening and gait.   Rehab Potential Good   PT Frequency 2x / week   PT Duration 8 weeks  plus eval   PT Treatment/Interventions ADLs/Self Care Home Management;Functional  mobility training;Gait training;DME Instruction;Therapeutic activities;Therapeutic exercise;Balance training;Neuromuscular re-education;Patient/family education;Orthotic Fit/Training   PT Next Visit Plan Continue to work on core stability, lower extremity strengthening, standing balance, gait activities; SLS and compliant surfaces; Revisit HEP   Consulted and Agree  with Plan of Care Patient;Family member/caregiver   Family Member Consulted Vi      Patient will benefit from skilled therapeutic intervention in order to improve the following deficits and impairments:  Abnormal gait, Decreased balance, Decreased mobility, Decreased range of motion, Decreased safety awareness, Decreased strength, Difficulty walking, Impaired flexibility, Postural dysfunction  Visit Diagnosis: Unsteadiness on feet  Other abnormalities of gait and mobility  Muscle weakness (generalized)     Problem List Patient Active Problem List   Diagnosis Date Noted  . Memory loss   . Lumbar burst fracture ([place]) 06/08/2016  . Lumbar compression fracture ([place]) 06/08/2016  . Bilateral foot-drop   . Midline thoracic back pain   . PAF (paroxysmal atrial fibrillation) ([place])   . Depression   . Constipation due to pain medication   . Chronic constipation   . Compression fracture of L2 ([place]) 05/30/2016  . Elevated blood pressure 05/30/2016  . L2 vertebral fracture ([place]) 05/30/2016  . Anxiety state 12/10/2014  . Neurogenic bladder 11/30/2014  . Neurogenic bowel 11/30/2014  . Bacterial UTI 11/30/2014  . Thoracic myelopathy 11/29/2014  . Persistent atrial fibrillation ([place]) 11/27/2014  . Traumatic spinal subdural hematoma   . Acute pulmonary embolism ([place])   . Orthostatic hypotension 11/22/2014  . Near syncope 11/21/2014  . Paraplegia at T9 level ([place]) 11/17/2014  . Weakness of both legs   . Nocturnal leg cramps 07/16/2014  . Encounter for therapeutic drug monitoring 12/25/2013  . Malaise and fatigue 06/01/2011  .  Atrial flutter ([place]) 03/10/2011  . Long term (current) use of anticoagulants 03/10/2011  . Benign hypertensive heart disease without heart failure 03/10/2011  . Hypercholesterolemia 03/10/2011  . Paroxysmal atrial flutter ([place])   . Palpitations   . PAC (premature atrial contraction)   . PVC's (premature ventricular contractions)   . Malaise   . Fatigue   . Myalgia     MARRIOTT,AMY W. 10/19/2016, 10:38 PM  Frazier Butt., PT  [facility] [address] [place], [place], [address] Phone: [phone]   Fax:  [phone]  Name: [name] Weber MRN: [id number] Date of Birth: 02/17/1931

## 2016-10-20 ENCOUNTER — Ambulatory Visit: Payer: Medicare Other | Admitting: Physical Therapy

## 2016-10-20 DIAGNOSIS — R2689 Other abnormalities of gait and mobility: Secondary | ICD-10-CM

## 2016-10-20 DIAGNOSIS — R2681 Unsteadiness on feet: Secondary | ICD-10-CM

## 2016-10-20 DIAGNOSIS — M6281 Muscle weakness (generalized): Secondary | ICD-10-CM | POA: Diagnosis not present

## 2016-10-20 NOTE — Therapy (Signed)
[facility] [address] [place], [place], [address] Phone: [phone]   Fax:  [phone]  Physical Therapy Treatment  Patient Details  Name: [name] Weber MRN: [id number] Date of Birth: 27-Mar-1931 Referring Provider: Naaman Plummer  Encounter Date: 10/20/2016      PT End of Session - 10/20/16 1433    Visit Number 8   Number of Visits 17   Date for PT Re-Evaluation 11/08/16   Authorization Type UHC Medicare-GCODE every 10th visit   PT Start Time 0804   PT Stop Time 0845   PT Time Calculation (min) 41 min   Activity Tolerance Patient tolerated treatment well   Behavior During Therapy [facility] for tasks assessed/performed      Past Medical History:  Diagnosis Date  . Fatigue   . Malaise   . Myalgia   . PAC (premature atrial contraction)    ISOLATED  . Palpitations    OCCASSIONAL  . Paroxysmal atrial flutter ([place])   . PVC's (premature ventricular contractions)    ISOLATED    Past Surgical History:  Procedure Laterality Date  . THORACIC LAMINECTOMY FOR EPIDURAL ABSCESS N/A 11/16/2014   Procedure: THORACIC LAMINECTOMY FOR EPIDURAL ABSCESS;  Surgeon: Floyce Stakes, MD;  Location: [facility];  Service: Neurosurgery;  Laterality: N/A;  . VULVAR LESION REMOVAL  01/01/2009   She had a 1-cm area of erythema to the right of the urethral meatus    There were no vitals filed for this visit.      Subjective Assessment - 10/20/16 0810    Subjective Still doing exercises at home-agrees to bring in handouts next visit.   Patient is accompained by: --  Charlesetta Ivory   Patient Stated Goals Pt wants to do whatever she needs to do to make back stronger.   Currently in Pain? No/denies                         [facility] Adult PT Treatment/Exercise - 10/20/16 0821      Transfers   Transfers Sit to Stand;Stand to Sit   Sit to Stand 5: Supervision;With upper extremity assist;From chair/3-in-1;From elevated  surface   Stand to Sit 5: Supervision;With upper extremity assist;To chair/3-in-1;To elevated surface   Number of Reps 10 reps;Other sets (comment)  3 sets from elevated surfaces UE support at RW upon stand   Comments Repeated sit<>stand for functional lower extremity strengthening, with cues for 3 seconds hold/quad and glut activation upon standing with minimal UE support     Ambulation/Gait   Ambulation/Gait Yes   Ambulation/Gait Assistance 5: Supervision   Ambulation/Gait Assistance Details Initial cues for increased step length, heelstrike.  Pt appears to have decreased steppage gait today.   Ambulation Distance (Feet) 400 Feet  then 200    Assistive device Rolling walker   Gait Pattern Step-through pattern;Decreased step length - right;Decreased step length - left;Decreased dorsiflexion - right;Decreased dorsiflexion - left;Right foot flat   Ambulation Surface Level;Indoor   Gait Comments Gait activities with environmental scanning and head turns.  Pt slows gait with head turns.     Neuro Re-ed    Neuro Re-ed Details  Seated core stability and balance activities-on green therapy disc:  alternating UE lifts, then bilateral UE lifts, gentle UE lifts with slight trunk rotation, then seated march, LAQ, then opposite arm/leg lifts with cues for abdominal activation.  Then on large therapy ball:  seated alternating UE lifts, lifts of weigthed ball, then  seated march, anterior/posterior weightshifting on therapy ball with min guard/min assistance.     Knee/Hip Exercises: Standing   Functional Squat 1 set;10 reps  at RW for UE support   Wall Squat 1 set;10 reps  cues for abdominal activation             Balance Exercises - 10/20/16 0832      Balance Exercises: Standing   Stepping Strategy Anterior;Lateral;UE support;10 reps  2 sets over obstacle   Step Ups Forward;6 inch;UE support 2  15 reps           PT Education - 10/20/16 1431    Education provided Yes   Education  Details Requested pt bring in HEP pictures for full review/updateing of HEP as needed and appropriate.   Person(s) Educated Patient;Other (comment)  Friend, Vi   Methods Explanation   Comprehension Verbalized understanding          PT Short Term Goals - 10/07/16 4128      PT SHORT TERM GOAL #1   Title Pt will be independent with HEP for improved strength, balance, and gait.  TARGET 10/09/16   Time 4   Period Weeks   Status Achieved     PT SHORT TERM GOAL #2   Title Pt will improve TUG score to less than or equal to 20 seconds for decreased fall risk.   Baseline 19.56 sec with RW on TUG 10/07/16   Time 4   Period Weeks   Status Achieved     PT SHORT TERM GOAL #3   Title Pt will improve Berg Balance score to at least 24/56 for decreased fall risk.   Time 4   Period Weeks   Status Not Met     PT SHORT TERM GOAL #4   Title Pt will perform at least 5 minutes of standing activities with intermittent UE support for improved participation in ADLs and household activities.   Time 4   Period Weeks   Status Achieved           PT Long Term Goals - 09/10/16 7867      PT LONG TERM GOAL #1   Title Pt will verbalize understanding of fall prevention within the home environemnt.  TARGET 11/08/16   Time 8   Period Weeks   Status New     PT LONG TERM GOAL #2   Title Pt will improve TUG score to less than or equal to 17 seconds for decreased fall risk.   Time 8   Period Weeks   Status New     PT LONG TERM GOAL #3   Title Pt will improve Berg balance score to at least 29/56 for decreased fall risk.     PT LONG TERM GOAL #4   Title Pt will improve gait velocity to at least 2.4 ft/sec for improved gait efficiency and safety with household and community gait.   Time 8   Period Weeks   Status New     PT LONG TERM GOAL #5   Title Pt will verbalize understanding of transition to community fitness upon D/C from PT.   Time 8   Period Weeks   Status New                Plan - 10/20/16 1433    Clinical Impression Statement Treatment session focused on trunk stability and leg strengthening.  Pt needs only occasional rest breaks during session, but continues to need UE support ofr stability with standing  strengthening.   Rehab Potential Good   PT Frequency 2x / week   PT Duration 8 weeks  plus eval   PT Treatment/Interventions ADLs/Self Care Home Management;Functional mobility training;Gait training;DME Instruction;Therapeutic activities;Therapeutic exercise;Balance training;Neuromuscular re-education;Patient/family education;Orthotic Fit/Training   PT Next Visit Plan Continue to work on core stability, lower extremity strengthening, standing balance, gait activities; SLS and compliant surfaces; Revisit HEP (pt to bring in)   Consulted and Agree with Plan of Care Patient;Family member/caregiver   Family Member Consulted Vi      Patient will benefit from skilled therapeutic intervention in order to improve the following deficits and impairments:  Abnormal gait, Decreased balance, Decreased mobility, Decreased range of motion, Decreased safety awareness, Decreased strength, Difficulty walking, Impaired flexibility, Postural dysfunction  Visit Diagnosis: Unsteadiness on feet  Muscle weakness (generalized)  Other abnormalities of gait and mobility     Problem List Patient Active Problem List   Diagnosis Date Noted  . Memory loss   . Lumbar burst fracture ([place]) 06/08/2016  . Lumbar compression fracture ([place]) 06/08/2016  . Bilateral foot-drop   . Midline thoracic back pain   . PAF (paroxysmal atrial fibrillation) ([place])   . Depression   . Constipation due to pain medication   . Chronic constipation   . Compression fracture of L2 ([place]) 05/30/2016  . Elevated blood pressure 05/30/2016  . L2 vertebral fracture ([place]) 05/30/2016  . Anxiety state 12/10/2014  . Neurogenic bladder 11/30/2014  . Neurogenic bowel 11/30/2014  . Bacterial UTI 11/30/2014  .  Thoracic myelopathy 11/29/2014  . Persistent atrial fibrillation ([place]) 11/27/2014  . Traumatic spinal subdural hematoma   . Acute pulmonary embolism ([place])   . Orthostatic hypotension 11/22/2014  . Near syncope 11/21/2014  . Paraplegia at T9 level ([place]) 11/17/2014  . Weakness of both legs   . Nocturnal leg cramps 07/16/2014  . Encounter for therapeutic drug monitoring 12/25/2013  . Malaise and fatigue 06/01/2011  . Atrial flutter ([place]) 03/10/2011  . Long term (current) use of anticoagulants 03/10/2011  . Benign hypertensive heart disease without heart failure 03/10/2011  . Hypercholesterolemia 03/10/2011  . Paroxysmal atrial flutter ([place])   . Palpitations   . PAC (premature atrial contraction)   . PVC's (premature ventricular contractions)   . Malaise   . Fatigue   . Myalgia     Shakeeta Godette W. 10/20/2016, 2:36 PM Frazier Butt., PT [facility] [address] [place], [place], [address] Phone: [phone]   Fax:  [phone]  Name: [name] Weber MRN: [id number] Date of Birth: 07-01-1931

## 2016-11-09 ENCOUNTER — Encounter: Payer: Medicare Other | Admitting: Physical Medicine & Rehabilitation

## 2016-11-10 ENCOUNTER — Ambulatory Visit: Payer: Medicare Other | Attending: Physical Medicine & Rehabilitation | Admitting: Physical Therapy

## 2016-11-10 DIAGNOSIS — M6281 Muscle weakness (generalized): Secondary | ICD-10-CM | POA: Insufficient documentation

## 2016-11-10 DIAGNOSIS — R2681 Unsteadiness on feet: Secondary | ICD-10-CM | POA: Insufficient documentation

## 2016-11-10 DIAGNOSIS — R2689 Other abnormalities of gait and mobility: Secondary | ICD-10-CM | POA: Diagnosis present

## 2016-11-10 NOTE — Therapy (Signed)
[facility] [address] [place], [place], [address] Phone: [phone]   Fax:  [phone]  Physical Therapy Treatment  Patient Details  Name: [name] Weber MRN: [id number] Date of Birth: 1931-02-01 Referring Provider: Naaman Plummer  Encounter Date: 11/10/2016      PT End of Session - 11/10/16 7035    Visit Number 9   Number of Visits 21  per recert 00/93/81   Date for PT Re-Evaluation 01/08/17   Authorization Type UHC Medicare-GCODE every 10th visit   PT Start Time 1152   PT Stop Time 1231   PT Time Calculation (min) 39 min   Activity Tolerance Patient tolerated treatment well   Behavior During Therapy [facility] for tasks assessed/performed      Past Medical History:  Diagnosis Date  . Fatigue   . Malaise   . Myalgia   . PAC (premature atrial contraction)    ISOLATED  . Palpitations    OCCASSIONAL  . Paroxysmal atrial flutter ([place])   . PVC's (premature ventricular contractions)    ISOLATED    Past Surgical History:  Procedure Laterality Date  . THORACIC LAMINECTOMY FOR EPIDURAL ABSCESS N/A 11/16/2014   Procedure: THORACIC LAMINECTOMY FOR EPIDURAL ABSCESS;  Surgeon: Floyce Stakes, MD;  Location: [facility];  Service: Neurosurgery;  Laterality: N/A;  . VULVAR LESION REMOVAL  01/01/2009   She had a 1-cm area of erythema to the right of the urethral meatus    There were no vitals filed for this visit.      Subjective Assessment - 11/10/16 1237    Subjective Denies falls or changes.  Forgot to bring HEP.   Patient is accompained by: --  Friend   Patient Stated Goals Pt wants to do whatever she needs to do to make back stronger.   Currently in Pain? No/denies            [facility] Adult PT Treatment/Exercise - 11/10/16 0001      Transfers   Transfers Sit to Stand;Stand to Sit   Sit to Stand 5: Supervision;With upper extremity assist;From chair/3-in-1;From elevated surface   Stand to Sit 5:  Supervision;With upper extremity assist;To chair/3-in-1;To elevated surface   Number of Reps 10 reps   Comments Repeated x 10 for LE strengthening and functional transfers.  Pt needs cues for forward lean, foot placement, equal LE weight bearing and to avoid leaning against mat.     Ambulation/Gait   Ambulation/Gait Yes   Ambulation/Gait Assistance 5: Supervision   Ambulation/Gait Assistance Details cues for increased heel strike bil   Ambulation Distance (Feet) 330 Feet  110' x 2   Assistive device Rolling walker   Gait Pattern Step-through pattern;Decreased step length - right;Decreased step length - left;Decreased dorsiflexion - right;Decreased dorsiflexion - left;Right foot flat   Ambulation Surface Level;Indoor   Gait velocity 13.42 sec=2.44 ft/sec   Gait Comments Pt arrived with bil AFO's with siginificant gap at top.  Instructed pt on proper fit of AFO's for optimum benefit.  Pt able to return demonstration on how to tighten/fit properly.     Posture/Postural Control   Posture/Postural Control Postural limitations   Postural Limitations Posterior pelvic tilt  increased forward lean     Standardized Balance Assessment   Standardized Balance Assessment Timed Up and Go Test     Timed Up and Go Test   TUG Normal TUG   Normal TUG (seconds) 16.87     Self-Care   Self-Care Other Self-Care Comments  Other Self-Care Comments  Discussed progress toward goals and renewal per Mady Haagensen, PT.     Neuro Re-ed    Neuro Re-ed Details  Seated core stability and balance activities-on green therapy disc:  alternating UE lifts, then bilateral UE lifts, anterior/posterior pelvic tilts, then seated march, LAQ, with 2# weights x 15 reps with cues for abdominal activation.     Exercises   Exercises Knee/Hip     Knee/Hip Exercises: Standing   Forward Step Up 1 set;Both;15 reps;Hand Hold: 1;Step Height: 6"     Knee/Hip Exercises: Seated   Long Arc Quad Both;15 reps;Weights;Strengthening    Long Arc Quad Weight 2 lbs.   Marching Both;15 reps;Weights;Strengthening   Marching Limitations 2                PT Education - 11/10/16 1244    Education provided Yes   Education Details Proper fit of bil AFO's;progress toward goals and renewal per Mady Haagensen, PT.   Person(s) Educated Patient;Other (comment)   Methods Explanation;Demonstration;Verbal cues   Comprehension Verbalized understanding;Returned demonstration          PT Short Term Goals - 11/10/16 1335      PT SHORT TERM GOAL #1   Title Pt will be independent with updated/progressive HEP for improved strength, balance, and gait.  UPDATED TARGET 11/27/16   Baseline per recert 85/63/14   Time 4   Period Weeks   Status Revised     PT SHORT TERM GOAL #2   Title Pt will improve TUG score to less than or equal to 20 seconds for decreased fall risk.   Baseline 19.56 sec with RW on TUG 10/07/16   Time 4   Period Weeks   Status Achieved     PT SHORT TERM GOAL #3   Title Pt will improve Berg Balance score to at least 24/56 for decreased fall risk.   Time 4   Period Weeks   Status Not Met     PT SHORT TERM GOAL #4   Title Pt will perform at least 5 minutes of standing activities with intermittent UE support for improved participation in ADLs and household activities.   Time 4   Period Weeks   Status Achieved           PT Long Term Goals - 11/10/16 1245      PT LONG TERM GOAL #1   Title Pt will verbalize understanding of fall prevention within the home environemnt.  TARGET 11/08/16  GOAL ONGOING-UPDATED TARGET 12/18/16   Time 6   Period Weeks   Status On-going     PT LONG TERM GOAL #2   Title Pt will improve TUG score to less than or equal to 17 seconds for decreased fall risk.  GOAL UPDATED TUG scores <or equal to 15 seconds for decreased fall risk.  TARGET 12/18/16   Baseline 16.87 seconds with Rollator on 11/10/16   Time 6   Period Weeks   Status Revised     PT LONG TERM GOAL #3   Title Pt  will improve Berg balance score to at least 29/56 for decreased fall risk.  GOAL ONGOING-UPDATED TARGET 12/18/16   Time 6   Period Weeks   Status On-going     PT LONG TERM GOAL #4   Title Pt will improve gait velocity to at least 2.4 ft/sec for improved gait efficiency and safety with household and community gait.  GOAL UPDATED Gait velocity at least 2.62 ft/sec for improved community  gait.  TARGET 12/18/16   Baseline 2.44 ft/sec with Rollator on 11/10/16   Time 6   Period Weeks   Status Revised     PT LONG TERM GOAL #5   Title Pt will verbalize understanding of transition to community fitness upon D/C from PT.  ONGOING GOAL UPDATED TARGET 12/18/16   Time 8   Period Weeks   Status On-going               Plan - 11/10/16 1247    Clinical Impression Statement Pt met LTG's 2 and 4.  Other LTG's ongoing.  Pt has not been seen in 3 weeks due to scheduling.  Renewal per Mady Haagensen, PT.  Brief discussion with pt, caregiver, and PTA-due to scheduling issues, pt has not been seen and will benefit from further skilled PT-See renewal  NOte modified STG for HEP and updated LTGs.   Rehab Potential Good   PT Frequency 2x / week   PT Duration 6 weeks  per recert 56/97/94   PT Treatment/Interventions ADLs/Self Care Home Management;Functional mobility training;Gait training;DME Instruction;Therapeutic activities;Therapeutic exercise;Balance training;Neuromuscular re-education;Patient/family education;Orthotic Fit/Training   PT Next Visit Plan Renewal per Mady Haagensen, PT.  G-code next visit.  Revisit and revise HEP (pt to bring in next session).   Consulted and Agree with Plan of Care Patient;Family member/caregiver   Family Member Consulted Vi      Patient will benefit from skilled therapeutic intervention in order to improve the following deficits and impairments:  Abnormal gait, Decreased balance, Decreased mobility, Decreased range of motion, Decreased safety awareness, Decreased strength,  Difficulty walking, Impaired flexibility, Postural dysfunction  Visit Diagnosis: Unsteadiness on feet  Muscle weakness (generalized)  Other abnormalities of gait and mobility     Problem List Patient Active Problem List   Diagnosis Date Noted  . Memory loss   . Lumbar burst fracture ([place]) 06/08/2016  . Lumbar compression fracture ([place]) 06/08/2016  . Bilateral foot-drop   . Midline thoracic back pain   . PAF (paroxysmal atrial fibrillation) ([place])   . Depression   . Constipation due to pain medication   . Chronic constipation   . Compression fracture of L2 ([place]) 05/30/2016  . Elevated blood pressure 05/30/2016  . L2 vertebral fracture ([place]) 05/30/2016  . Anxiety state 12/10/2014  . Neurogenic bladder 11/30/2014  . Neurogenic bowel 11/30/2014  . Bacterial UTI 11/30/2014  . Thoracic myelopathy 11/29/2014  . Persistent atrial fibrillation ([place]) 11/27/2014  . Traumatic spinal subdural hematoma   . Acute pulmonary embolism ([place])   . Orthostatic hypotension 11/22/2014  . Near syncope 11/21/2014  . Paraplegia at T9 level ([place]) 11/17/2014  . Weakness of both legs   . Nocturnal leg cramps 07/16/2014  . Encounter for therapeutic drug monitoring 12/25/2013  . Malaise and fatigue 06/01/2011  . Atrial flutter ([place]) 03/10/2011  . Long term (current) use of anticoagulants 03/10/2011  . Benign hypertensive heart disease without heart failure 03/10/2011  . Hypercholesterolemia 03/10/2011  . Paroxysmal atrial flutter ([place])   . Palpitations   . PAC (premature atrial contraction)   . PVC's (premature ventricular contractions)   . Malaise   . Fatigue   . Myalgia     MARRIOTT,AMY W. 11/10/2016, 1:37 PM  Frazier Butt., PT  [facility] [address] [place], [place], [address] Phone: [phone]   Fax:  [phone]  Name: [name] Weber MRN: [id number] Date of Birth: [birth year]  Narda Bonds, PTA [facility] 11/10/16 1:37 PM Phone: [phone] Fax: [phone]

## 2016-11-11 ENCOUNTER — Ambulatory Visit: Payer: Medicare Other | Admitting: Physical Therapy

## 2016-11-11 DIAGNOSIS — R2681 Unsteadiness on feet: Secondary | ICD-10-CM | POA: Diagnosis not present

## 2016-11-11 DIAGNOSIS — M6281 Muscle weakness (generalized): Secondary | ICD-10-CM

## 2016-11-11 DIAGNOSIS — R2689 Other abnormalities of gait and mobility: Secondary | ICD-10-CM

## 2016-11-11 NOTE — Patient Instructions (Addendum)
HIP: Hamstrings - Short Sitting    Rest leg on raised surface. Keep knee straight. Lift chest. Hold _30__ seconds. _3__ reps per set, _2-3__ sets per day.  Copyright  VHI. All rights reserved.   

## 2016-11-12 NOTE — Therapy (Signed)
[facility] [address] [place], [place], [address] Phone: [phone]   Fax:  [phone]  Physical Therapy Treatment  Patient Details  Name: [name] Weber MRN: [id number] Date of Birth: [birth year] Referring Provider: Naaman Plummer  Encounter Date: 11/11/2016      PT End of Session - 11/12/16 0914    Visit Number 10   Number of Visits 21  per recert 85/46/27   Date for PT Re-Evaluation 01/08/17   Authorization Type UHC Medicare-GCODE every 10th visit   PT Start Time 0934   PT Stop Time 1017   PT Time Calculation (min) 43 min   Activity Tolerance Patient tolerated treatment well   Behavior During Therapy [facility] for tasks assessed/performed      Past Medical History:  Diagnosis Date  . Fatigue   . Malaise   . Myalgia   . PAC (premature atrial contraction)    ISOLATED  . Palpitations    OCCASSIONAL  . Paroxysmal atrial flutter ([place])   . PVC's (premature ventricular contractions)    ISOLATED    Past Surgical History:  Procedure Laterality Date  . THORACIC LAMINECTOMY FOR EPIDURAL ABSCESS N/A 11/16/2014   Procedure: THORACIC LAMINECTOMY FOR EPIDURAL ABSCESS;  Surgeon: Floyce Stakes, MD;  Location: [facility];  Service: Neurosurgery;  Laterality: N/A;  . VULVAR LESION REMOVAL  01/01/2009   She had a 1-cm area of erythema to the right of the urethral meatus    There were no vitals filed for this visit.      Subjective Assessment - 11/11/16 0937    Subjective Brought in HEP today.  No falls, no pain   Patient is accompained by: --  Friend   Patient Stated Goals Pt wants to do whatever she needs to do to make back stronger.   Currently in Pain? No/denies                         [facility] Adult PT Treatment/Exercise - 11/11/16 0943      Standardized Balance Assessment   Standardized Balance Assessment Berg Balance Test  Unable to complete due to time constraints     Berg Balance Test    Sit to Stand Able to stand  independently using hands   Standing Unsupported Able to stand 30 seconds unsupported   Sitting with Back Unsupported but Feet Supported on Floor or Stool Able to sit safely and securely 2 minutes   Stand to Sit Controls descent by using hands   Transfers Able to transfer with verbal cueing and /or supervision   Standing Unsupported with Eyes Closed Able to stand 3 seconds   Standing Ubsupported with Feet Together Needs help to attain position but able to stand for 30 seconds with feet together     Knee/Hip Exercises: Stretches   Active Hamstring Stretch Right;Left;3 reps;30 seconds     Knee/Hip Exercises: Seated   Long Arc Quad Both;Weights;Strengthening;2 sets;10 reps  Attempted 3#, then no weight-cues for technqiue   [employer] --  3rd set of 10 reps with 2lb weight-no c/o pain    Marching Both;Weights;Strengthening;2 sets;10 reps  Attempted 3#, pt c/o pain in low back   Marching Limitations 3rd set of 10 with 2#-no c/o back pain   Sit to Sand 10 reps;with UE support;Other (comment)     Knee/Hip Exercises: Supine   Straight Leg Raises Both;10 reps   Other Supine Knee/Hip Exercises hooklying for bil hip abduction/add  x 10 reps then repeated 10 times isolating for single leg  red theraband     Knee/Hip Exercises: Sidelying   Hip ABduction Strengthening;Right;Left;2 sets;10 reps   Hip ABduction Limitations cues for positioning   Clams 10 reps with red theraband and cues for full ROM      Reviewed and modified HEP, keeping the following exercises as part of the HEP:  -seated LAQ, march with 2# weights-10 reps, 2-3 sets (if not using weight, cues to hold 3 sec) -sit to stand 10 reps (cues for upright standing) -supine SLR x 10 reps -supine hip abduction with red theraband in hooklying position -sidelying hip abduction (straight leg) x 10 reps -sidelying clamshell red theraband x 10 reps  -seated hamstring stretch 3 x 30  seconds           PT Education - 11/12/16 0912    Education provided Yes   Education Details Review of HEP-d/c standing corner exercises; continue HEP 1-2 x/day   Person(s) Educated Patient;Other (comment)  Friend   Methods Explanation;Demonstration;Verbal cues;Handout  Updated HEP handouts   Comprehension Verbalized understanding;Returned demonstration;Verbal cues required          PT Short Term Goals - 11/10/16 1335      PT SHORT TERM GOAL #1   Title Pt will be independent with updated/progressive HEP for improved strength, balance, and gait.  UPDATED TARGET 11/27/16   Baseline per recert 00/37/04   Time 4   Period Weeks   Status Revised     PT SHORT TERM GOAL #2   Title Pt will improve TUG score to less than or equal to 20 seconds for decreased fall risk.   Baseline 19.56 sec with RW on TUG 10/07/16   Time 4   Period Weeks   Status Achieved     PT SHORT TERM GOAL #3   Title Pt will improve Berg Balance score to at least 24/56 for decreased fall risk.   Time 4   Period Weeks   Status Not Met     PT SHORT TERM GOAL #4   Title Pt will perform at least 5 minutes of standing activities with intermittent UE support for improved participation in ADLs and household activities.   Time 4   Period Weeks   Status Achieved           PT Long Term Goals - 11/10/16 1245      PT LONG TERM GOAL #1   Title Pt will verbalize understanding of fall prevention within the home environemnt.  TARGET 11/08/16  GOAL ONGOING-UPDATED TARGET 12/18/16   Time 6   Period Weeks   Status On-going     PT LONG TERM GOAL #2   Title Pt will improve TUG score to less than or equal to 17 seconds for decreased fall risk.  GOAL UPDATED TUG scores <or equal to 15 seconds for decreased fall risk.  TARGET 12/18/16   Baseline 16.87 seconds with Rollator on 11/10/16   Time 6   Period Weeks   Status Revised     PT LONG TERM GOAL #3   Title Pt will improve Berg balance score to at least 29/56 for  decreased fall risk.  GOAL ONGOING-UPDATED TARGET 12/18/16   Time 6   Period Weeks   Status On-going     PT LONG TERM GOAL #4   Title Pt will improve gait velocity to at least 2.4 ft/sec for improved gait efficiency and safety with household and community gait.  GOAL  UPDATED Gait velocity at least 2.62 ft/sec for improved community gait.  TARGET 12/18/16   Baseline 2.44 ft/sec with Rollator on 11/10/16   Time 6   Period Weeks   Status Revised     PT LONG TERM GOAL #5   Title Pt will verbalize understanding of transition to community fitness upon D/C from PT.  ONGOING GOAL UPDATED TARGET 12/18/16   Time 8   Period Weeks   Status On-going               Plan - 11/12/16 0914    Clinical Impression Statement Spent time during session today reviewing full HEP that pt brought in.  Focused on supine/seated/sidelying and sit<>stand activities.  Pt does need cues for slowed pace of exercises and to perform consistently.  Attempted to trial 3# weight for lower extremity strengthening, but pt unable to tolerated due to low back pain.  Began [employer] assessment, but unable to complete due to time constraints.   Rehab Potential Good   PT Frequency 2x / week   PT Duration 6 weeks  per recert 84/16/60   PT Treatment/Interventions ADLs/Self Care Home Management;Functional mobility training;Gait training;DME Instruction;Therapeutic activities;Therapeutic exercise;Balance training;Neuromuscular re-education;Patient/family education;Orthotic Fit/Training   PT Next Visit Plan Review standing hip strengthening.  Work on standing functional strengthening including standing tolerance without UE support.  (this is week 1 of 6 in new POC)   Consulted and Agree with Plan of Care Patient;Other (Comment)   Family Member Consulted Friend      Patient will benefit from skilled therapeutic intervention in order to improve the following deficits and impairments:  Abnormal gait, Decreased balance, Decreased  mobility, Decreased range of motion, Decreased safety awareness, Decreased strength, Difficulty walking, Impaired flexibility, Postural dysfunction  Visit Diagnosis: Other abnormalities of gait and mobility  Muscle weakness (generalized)  Unsteadiness on feet       G-Codes - 11/24/2016 0918    Functional Assessment Tool Used TUG 16.87 sec, gait velocity 2.44 ft/sec   Functional Limitation Mobility: Walking and moving around   Mobility: Walking and Moving Around Current Status [phone]) At least 40 percent but less than 60 percent impaired, limited or restricted   Mobility: Walking and Moving Around Goal Status [phone]) At least 20 percent but less than 40 percent impaired, limited or restricted      Problem List Patient Active Problem List   Diagnosis Date Noted  . Memory loss   . Lumbar burst fracture ([place]) 06/08/2016  . Lumbar compression fracture ([place]) 06/08/2016  . Bilateral foot-drop   . Midline thoracic back pain   . PAF (paroxysmal atrial fibrillation) ([place])   . Depression   . Constipation due to pain medication   . Chronic constipation   . Compression fracture of L2 ([place]) 05/30/2016  . Elevated blood pressure 05/30/2016  . L2 vertebral fracture ([place]) 05/30/2016  . Anxiety state 12/10/2014  . Neurogenic bladder 11/30/2014  . Neurogenic bowel 11/30/2014  . Bacterial UTI 11/30/2014  . Thoracic myelopathy 11/29/2014  . Persistent atrial fibrillation ([place]) 11/27/2014  . Traumatic spinal subdural hematoma   . Acute pulmonary embolism ([place])   . Orthostatic hypotension 11/22/2014  . Near syncope 11/21/2014  . Paraplegia at T9 level ([place]) 11/17/2014  . Weakness of both legs   . Nocturnal leg cramps 07/16/2014  . Encounter for therapeutic drug monitoring 12/25/2013  . Malaise and fatigue 06/01/2011  . Atrial flutter ([place]) 03/10/2011  . Long term (current) use of anticoagulants 03/10/2011  . Benign hypertensive  heart disease without heart failure 03/10/2011  .  Hypercholesterolemia 03/10/2011  . Paroxysmal atrial flutter ([place])   . Palpitations   . PAC (premature atrial contraction)   . PVC's (premature ventricular contractions)   . Malaise   . Fatigue   . Myalgia     Suraj Ramdass W. 11/12/2016, 9:20 AM Frazier Butt., PT [facility] [address] [place], [place], [address] Phone: [phone]   Fax:  [phone]  Name: [name] Weber MRN: [id number] Date of Birth: 1931/08/15

## 2016-11-13 ENCOUNTER — Ambulatory Visit: Payer: Medicare Other | Admitting: Internal Medicine

## 2016-11-18 ENCOUNTER — Ambulatory Visit: Payer: Medicare Other | Admitting: Physical Therapy

## 2016-11-18 DIAGNOSIS — R2681 Unsteadiness on feet: Secondary | ICD-10-CM | POA: Diagnosis not present

## 2016-11-18 DIAGNOSIS — M6281 Muscle weakness (generalized): Secondary | ICD-10-CM

## 2016-11-18 DIAGNOSIS — R2689 Other abnormalities of gait and mobility: Secondary | ICD-10-CM

## 2016-11-18 NOTE — Patient Instructions (Addendum)
  FOR THE STANDING EXERCISES, WEAR YOUR ANKLE WEIGHTS AND STAND AT THE COUNTER FOR SAFETY.  Standing Hip Flexion    While standing on one leg, lift your other leg forward with a straight knee as shown. Return to starting position and repeat 10  times for 2 sets.  1-2 sessions per day.   Use your arms for support if needed for balance and safety.  Standing Hip Abduction    While standing, raise your leg out to the side. Keep your knee straight and maintain your toes pointed forward the entire time.  Repeat 10 times for 2 sets.  1-2 sessions per day.  Use your arms for support if needed for balance and safety.  Standing Hip Extension    While standing, balance on one leg and move your other leg in a backward direction. Do not swing the leg. Perform smooth and controlled movements.   Keep your trunk stable and without arching during the movement.  Repeat 10 times for 2 sets.  Perform 1-2 sessions per day.  Use your arms for support if needed for balance and safety.  Hamstring Curl    Stand on both legs. Inhaling, shift weight onto left leg. Exhaling, bend right leg, heel toward buttock then inhale and placing right leg down, shift weight onto right leg. Repeat with other leg. Repeat _ 2 sets of 10__ times, alternating legs. Do _1-2__ times per day.  Copyright  VHI. All rights reserved.  "I love a Parade" Lift    Using a chair if necessary, march in place  Repeat _2 sets of 10___ times. Do _1-2__ sessions per day.  http://gt2.exer.us/345   Copyright  VHI. All rights reserved.  Side-Stepping    Walk to left side with eyes open. Take even steps, leading with same foot. Make sure each foot lifts off the floor. Repeat in opposite direction. Repeat for __2-3__ minutes per session. Do __1-2__ sessions per day. Do this at your counter, wearing your ankle weights.    MAKE SURE TO STAND TALL AND UPRIGHT AS POSSIBLE WITH YOUR STANDING EXERCISES.  MAKE SURE TO TIGHTEN YOUR  STOMACH MUSCLES WHILE DOING THESE STANDING EXERCISES.  Copyright  VHI. All rights reserved.

## 2016-11-19 ENCOUNTER — Ambulatory Visit: Payer: Medicare Other | Admitting: Physical Therapy

## 2016-11-19 DIAGNOSIS — M6281 Muscle weakness (generalized): Secondary | ICD-10-CM

## 2016-11-19 DIAGNOSIS — R2681 Unsteadiness on feet: Secondary | ICD-10-CM | POA: Diagnosis not present

## 2016-11-19 DIAGNOSIS — R2689 Other abnormalities of gait and mobility: Secondary | ICD-10-CM

## 2016-11-19 NOTE — Therapy (Signed)
[facility] [address] [place], [place], [address] Phone: [phone]   Fax:  [phone]  Physical Therapy Treatment  Patient Details  Name: [name] Weber MRN: [id number] Date of Birth: 10-Aug-1931 Referring Provider: Naaman Plummer  Encounter Date: 11/19/2016      PT End of Session - 11/19/16 1419    Visit Number 12   Number of Visits 21  per recert 32/95/18   Date for PT Re-Evaluation 01/08/17   Authorization Type UHC Medicare-GCODE every 10th visit   PT Start Time 1103   PT Stop Time 1145   PT Time Calculation (min) 42 min   Equipment Utilized During Treatment Gait belt   Activity Tolerance Patient tolerated treatment well   Behavior During Therapy [facility] for tasks assessed/performed      Past Medical History:  Diagnosis Date  . Fatigue   . Malaise   . Myalgia   . PAC (premature atrial contraction)    ISOLATED  . Palpitations    OCCASSIONAL  . Paroxysmal atrial flutter ([place])   . PVC's (premature ventricular contractions)    ISOLATED    Past Surgical History:  Procedure Laterality Date  . THORACIC LAMINECTOMY FOR EPIDURAL ABSCESS N/A 11/16/2014   Procedure: THORACIC LAMINECTOMY FOR EPIDURAL ABSCESS;  Surgeon: Floyce Stakes, MD;  Location: [facility];  Service: Neurosurgery;  Laterality: N/A;  . VULVAR LESION REMOVAL  01/01/2009   She had a 1-cm area of erythema to the right of the urethral meatus    There were no vitals filed for this visit.      Subjective Assessment - 11/19/16 1110    Subjective Friend, Vie, asks about balance in order to be able to get back to [facility] in the water.   Patient is accompained by: Family member  Malachy Mood   Patient Stated Goals Pt wants to do whatever she needs to do to make back stronger.   Currently in Pain? No/denies                         [facility] Adult PT Treatment/Exercise - 11/19/16 1302      Ambulation/Gait   Ambulation/Gait Yes   Ambulation/Gait Assistance 5: Supervision   Ambulation Distance (Feet) 40 Feet  x 2, 100 ft x 2   Assistive device Rolling walker   Gait Pattern Step-through pattern;Decreased step length - right;Decreased step length - left;Decreased dorsiflexion - right;Decreased dorsiflexion - left;Right foot flat   Ambulation Surface Level;Indoor   Stairs Yes   Stairs Assistance 5: Supervision;4: Min assist   Stairs Assistance Details (indicate cue type and reason) With bilateral rails, pt performs step through pattern x 2 with supervision.  With one rail simulating pool entry/exit, pt performs with step to pattern, with min assist/HHA, x 2 reps   Stair Management Technique One rail Right;One rail Left;Two rails;Alternating pattern;Step to pattern   Number of Stairs 4  4 reps   Height of Stairs 6        Neuro Re-education:      Balance Exercises - 11/19/16 1315      Balance Exercises: Standing   Standing Eyes Opened Narrow base of support (BOS);Wide (BOA);Solid surface;Head turns  30 sec feet together, 120 sec feet apart, head nods x10 each   Step Ups Forward;4 inch;UE support 1  x 10 reps each leg      In parallel bars:  With widened BOS - alternating UE lifts, then UE lifts with  2.2lb weighted ball  -With widened semi-tandem BOS-alternating UE lifts, then head turns x 10, head nods x 10 rep with UE support -Single limb stance activities with intermittent 1-2 UE support  -forward step over obstacle, x 10 reps each leg  -forward/back rocking with obstacle in middle, for larger BOS and improved SLS  -side step over obstacle x 10 reps each leg  -side/side rocking with obstacle between legs, for improved BOS and improved SLS  -side step over, bilateral feet R and L, 10 reps each leg  -Forward step downs 4 inch, UE support x 10 reps each leg -SLS with forward step and back step, UE support x 10 reps each leg        PT Short Term Goals - 11/10/16 1335      PT SHORT TERM GOAL #1    Title Pt will be independent with updated/progressive HEP for improved strength, balance, and gait.  UPDATED TARGET 11/27/16   Baseline per recert 72/62/03   Time 4   Period Weeks   Status Revised     PT SHORT TERM GOAL #2   Title Pt will improve TUG score to less than or equal to 20 seconds for decreased fall risk.   Baseline 19.56 sec with RW on TUG 10/07/16   Time 4   Period Weeks   Status Achieved     PT SHORT TERM GOAL #3   Title Pt will improve Berg Balance score to at least 24/56 for decreased fall risk.   Time 4   Period Weeks   Status Not Met     PT SHORT TERM GOAL #4   Title Pt will perform at least 5 minutes of standing activities with intermittent UE support for improved participation in ADLs and household activities.   Time 4   Period Weeks   Status Achieved           PT Long Term Goals - 11/10/16 1245      PT LONG TERM GOAL #1   Title Pt will verbalize understanding of fall prevention within the home environemnt.  TARGET 11/08/16  GOAL ONGOING-UPDATED TARGET 12/18/16   Time 6   Period Weeks   Status On-going     PT LONG TERM GOAL #2   Title Pt will improve TUG score to less than or equal to 17 seconds for decreased fall risk.  GOAL UPDATED TUG scores <or equal to 15 seconds for decreased fall risk.  TARGET 12/18/16   Baseline 16.87 seconds with Rollator on 11/10/16   Time 6   Period Weeks   Status Revised     PT LONG TERM GOAL #3   Title Pt will improve Berg balance score to at least 29/56 for decreased fall risk.  GOAL ONGOING-UPDATED TARGET 12/18/16   Time 6   Period Weeks   Status On-going     PT LONG TERM GOAL #4   Title Pt will improve gait velocity to at least 2.4 ft/sec for improved gait efficiency and safety with household and community gait.  GOAL UPDATED Gait velocity at least 2.62 ft/sec for improved community gait.  TARGET 12/18/16   Baseline 2.44 ft/sec with Rollator on 11/10/16   Time 6   Period Weeks   Status Revised     PT LONG TERM  GOAL #5   Title Pt will verbalize understanding of transition to community fitness upon D/C from PT.  ONGOING GOAL UPDATED TARGET 12/18/16   Time 8   Period Weeks  Status On-going               Plan - 11/19/16 1419    Clinical Impression Statement Focused treatment session on balance as a result of discussion on patient's goals to get back to water exercises at [facility] pool.  With cues, patient able to decreased UE from 2 to 1 UE support and pt able to maintain upright posture.  Pt will continue to benefit from further skilled PT to address balance, gait and strengthening.   Rehab Potential Good   PT Frequency 2x / week   PT Duration 6 weeks  per recert 65/53/74   PT Treatment/Interventions ADLs/Self Care Home Management;Functional mobility training;Gait training;DME Instruction;Therapeutic activities;Therapeutic exercise;Balance training;Neuromuscular re-education;Patient/family education;Orthotic Fit/Training   PT Next Visit Plan Review standing hip strengthening with weights as needed.  Work on standing balance activities, step negotiation (in preparation for return to pool activities at [facility] with caregiver assistance). (this is week 2 of 6 in new POC)   Consulted and Agree with Plan of Care Patient;Other (Comment)   Family Member Consulted [name]      Patient will benefit from skilled therapeutic intervention in order to improve the following deficits and impairments:  Abnormal gait, Decreased balance, Decreased mobility, Decreased range of motion, Decreased safety awareness, Decreased strength, Difficulty walking, Impaired flexibility, Postural dysfunction  Visit Diagnosis: Unsteadiness on feet  Muscle weakness (generalized)  Other abnormalities of gait and mobility     Problem List Patient Active Problem List   Diagnosis Date Noted  . Memory loss   . Lumbar burst fracture ([place]) 06/08/2016  . Lumbar compression fracture ([place]) 06/08/2016  . Bilateral foot-drop   .  Midline thoracic back pain   . PAF (paroxysmal atrial fibrillation) ([place])   . Depression   . Constipation due to pain medication   . Chronic constipation   . Compression fracture of L2 ([place]) 05/30/2016  . Elevated blood pressure 05/30/2016  . L2 vertebral fracture ([place]) 05/30/2016  . Anxiety state 12/10/2014  . Neurogenic bladder 11/30/2014  . Neurogenic bowel 11/30/2014  . Bacterial UTI 11/30/2014  . Thoracic myelopathy 11/29/2014  . Persistent atrial fibrillation ([place]) 11/27/2014  . Traumatic spinal subdural hematoma   . Acute pulmonary embolism ([place])   . Orthostatic hypotension 11/22/2014  . Near syncope 11/21/2014  . Paraplegia at T9 level ([place]) 11/17/2014  . Weakness of both legs   . Nocturnal leg cramps 07/16/2014  . Encounter for therapeutic drug monitoring 12/25/2013  . Malaise and fatigue 06/01/2011  . Atrial flutter ([place]) 03/10/2011  . Long term (current) use of anticoagulants 03/10/2011  . Benign hypertensive heart disease without heart failure 03/10/2011  . Hypercholesterolemia 03/10/2011  . Paroxysmal atrial flutter ([place])   . Palpitations   . PAC (premature atrial contraction)   . PVC's (premature ventricular contractions)   . Malaise   . Fatigue   . Myalgia     Bill Yohn W. 11/19/2016, 2:24 PM  Frazier Butt., PT  [facility] [address] [place], [place], [address] Phone: [phone]   Fax:  [phone]  Name: BRIANNAH LONA MRN: [id number] Date of Birth: 14-Jun-1931

## 2016-11-19 NOTE — Therapy (Signed)
[name] [address] [place], [place], [address] Phone: [phone]   Fax:  [phone]  Physical Therapy Treatment  Patient Details  Name: [name] Weber MRN: [id number] Date of Birth: [birth year] Referring Provider: Naaman Plummer  Encounter Date: 11/18/2016      PT End of Session - 11/19/16 0807    Visit Number 11   Number of Visits 21  per recert 94/85/46   Date for PT Re-Evaluation 01/08/17   Authorization Type UHC Medicare-GCODE every 10th visit   PT Start Time 1103   PT Stop Time 1154   PT Time Calculation (min) 51 min   Equipment Utilized During Treatment Gait belt   Activity Tolerance Patient tolerated treatment well   Behavior During Therapy [facility] for tasks assessed/performed      Past Medical History:  Diagnosis Date  . Fatigue   . Malaise   . Myalgia   . PAC (premature atrial contraction)    ISOLATED  . Palpitations    OCCASSIONAL  . Paroxysmal atrial flutter ([place])   . PVC's (premature ventricular contractions)    ISOLATED    Past Surgical History:  Procedure Laterality Date  . THORACIC LAMINECTOMY FOR EPIDURAL ABSCESS N/A 11/16/2014   Procedure: THORACIC LAMINECTOMY FOR EPIDURAL ABSCESS;  Surgeon: Floyce Stakes, MD;  Location: [facility];  Service: Neurosurgery;  Laterality: N/A;  . VULVAR LESION REMOVAL  01/01/2009   She had a 1-cm area of erythema to the right of the urethral meatus    There were no vitals filed for this visit.      Subjective Assessment - 11/18/16 1105    Subjective Brought in HEP today.  No falls, no pain.  Been doing the exercises at home with 2# weights.  Caregiver/friend asks assistance in finding elastic shoe laces, as pt's have broken   Patient is accompained by: Family member  Friend, [place]   Patient Stated Goals Pt wants to do whatever she needs to do to make back stronger.   Currently in Pain? No/denies      (No charge at end of session):  Per OT at  clinic, provided patient with pair of elastic shoelaces.  PT spent time unlacing/relacing shoes with new elastic laces, per patient/caregiver request.                   [facility] Adult PT Treatment/Exercise - 11/19/16 0800      Transfers   Transfers Sit to Stand;Stand to Sit   Sit to Stand 5: Supervision;With upper extremity assist;From chair/3-in-1;From elevated surface   Stand to Sit 5: Supervision;With upper extremity assist;To chair/3-in-1;To elevated surface   Number of Reps 10 reps;2 sets  from elevated mat surface   Comments For functional lower extremity strengthening, in conjunction with squats to elevated mat, x 2 sets of 10 reps.      Standardized Balance Assessment   Standardized Balance Assessment Berg Balance Test  completed from last visit     Berg Balance Test   Sit to Stand Able to stand  independently using hands   Standing Unsupported Able to stand 30 seconds unsupported   Sitting with Back Unsupported but Feet Supported on Floor or Stool Able to sit safely and securely 2 minutes   Stand to Sit Controls descent by using hands   Transfers Able to transfer with verbal cueing and /or supervision   Standing Unsupported with Eyes Closed Able to stand 3 seconds   Standing Ubsupported with Feet Together Needs  help to attain position but able to stand for 30 seconds with feet together   From Standing, Reach Forward with Outstretched Arm Can reach forward >5 cm safely (2")   From Standing Position, Pick up Object from Floor Unable to try/needs assist to keep balance   From Standing Position, Turn to Look Behind Over each Shoulder Needs supervision when turning   Turn 360 Degrees Needs assistance while turning   Standing Unsupported, Alternately Place Feet on Step/Stool Needs assistance to keep from falling or unable to try   Standing Unsupported, One Foot in [place] help to step but can hold 15 seconds   Standing on One Leg Tries to lift leg/unable to hold 3  seconds but remains standing independently   Total Score 22     Exercises   Exercises Knee/Hip     Knee/Hip Exercises: Aerobic   Other Aerobic SciFit Level, Level 2.5, 6 minutes 4 extremities for leg strengthening     Knee/Hip Exercises: Standing   Knee Flexion Strengthening;Right;Left;2 sets;10 reps  Hamstring curls, 2# weight   Hip Flexion Stengthening;Right;Left;2 sets;10 reps;Knee bent;Knee straight  2#   Hip Abduction Stengthening;Right;Left;10 reps;2 sets;Knee straight  2#   Hip Extension Stengthening;Right;Left;2 sets;10 reps;Knee straight  2#   Other Standing Knee Exercises Provided updated handouts for the above exercises for HEP with 2# weights.  Also, pt performed sidestepping along counter, 3 reps with UE support, with 2# weights at ankles for hip strengthening.  Cues provided throughout for upright posture and abdominal activation                PT Education - 11/19/16 0806    Education provided Yes   Education Details Updated standing exercises for HEP-see instructions   Person(s) Educated Patient;Other (comment)  Friend, Vie   Methods Explanation;Demonstration;Handout;Verbal cues   Comprehension Verbalized understanding;Returned demonstration;Verbal cues required          PT Short Term Goals - 11/10/16 1335      PT SHORT TERM GOAL #1   Title Pt will be independent with updated/progressive HEP for improved strength, balance, and gait.  UPDATED TARGET 11/27/16   Baseline per recert 27/78/24   Time 4   Period Weeks   Status Revised     PT SHORT TERM GOAL #2   Title Pt will improve TUG score to less than or equal to 20 seconds for decreased fall risk.   Baseline 19.56 sec with RW on TUG 10/07/16   Time 4   Period Weeks   Status Achieved     PT SHORT TERM GOAL #3   Title Pt will improve Berg Balance score to at least 24/56 for decreased fall risk.   Time 4   Period Weeks   Status Not Met     PT SHORT TERM GOAL #4   Title Pt will perform at  least 5 minutes of standing activities with intermittent UE support for improved participation in ADLs and household activities.   Time 4   Period Weeks   Status Achieved           PT Long Term Goals - 11/10/16 1245      PT LONG TERM GOAL #1   Title Pt will verbalize understanding of fall prevention within the home environemnt.  TARGET 11/08/16  GOAL ONGOING-UPDATED TARGET 12/18/16   Time 6   Period Weeks   Status On-going     PT LONG TERM GOAL #2   Title Pt will improve TUG score to less  than or equal to 17 seconds for decreased fall risk.  GOAL UPDATED TUG scores <or equal to 15 seconds for decreased fall risk.  TARGET 12/18/16   Baseline 16.87 seconds with Rollator on 11/10/16   Time 6   Period Weeks   Status Revised     PT LONG TERM GOAL #3   Title Pt will improve Berg balance score to at least 29/56 for decreased fall risk.  GOAL ONGOING-UPDATED TARGET 12/18/16   Time 6   Period Weeks   Status On-going     PT LONG TERM GOAL #4   Title Pt will improve gait velocity to at least 2.4 ft/sec for improved gait efficiency and safety with household and community gait.  GOAL UPDATED Gait velocity at least 2.62 ft/sec for improved community gait.  TARGET 12/18/16   Baseline 2.44 ft/sec with Rollator on 11/10/16   Time 6   Period Weeks   Status Revised     PT LONG TERM GOAL #5   Title Pt will verbalize understanding of transition to community fitness upon D/C from PT.  ONGOING GOAL UPDATED TARGET 12/18/16   Time 8   Period Weeks   Status On-going               Plan - 11/19/16 0807    Clinical Impression Statement Addressed standing exercises of pt's HEP and updated handouts for use of 2# weights with standing exercises at counter.  Pt needs cues for upright posture, abdominal activation, and slowed pace of exercises.  Pt's Berg score of 22/56 continues to show that patient is at fall risk, with pt having significant difficulty with standing without UE support.  Pt will  continue to benefit from further skilled PT to address core/back/leg strength, balance and gait.   Rehab Potential Good   PT Frequency 2x / week   PT Duration 6 weeks  per recert 82/99/37   PT Treatment/Interventions ADLs/Self Care Home Management;Functional mobility training;Gait training;DME Instruction;Therapeutic activities;Therapeutic exercise;Balance training;Neuromuscular re-education;Patient/family education;Orthotic Fit/Training   PT Next Visit Plan Review standing hip strengthening with weights.  Work on standing functional strengthening including standing tolerance without UE support; also work on quadruped/tall kneeling if able for back/core strength.  (this is week 2 of 6 in new POC)   Consulted and Agree with Plan of Care Patient;Other (Comment)   Family Member Consulted [name]      Patient will benefit from skilled therapeutic intervention in order to improve the following deficits and impairments:  Abnormal gait, Decreased balance, Decreased mobility, Decreased range of motion, Decreased safety awareness, Decreased strength, Difficulty walking, Impaired flexibility, Postural dysfunction  Visit Diagnosis: Muscle weakness (generalized)  Unsteadiness on feet  Other abnormalities of gait and mobility     Problem List Patient Active Problem List   Diagnosis Date Noted  . Memory loss   . Lumbar burst fracture ([place]) 06/08/2016  . Lumbar compression fracture ([place]) 06/08/2016  . Bilateral foot-drop   . Midline thoracic back pain   . PAF (paroxysmal atrial fibrillation) ([place])   . Depression   . Constipation due to pain medication   . Chronic constipation   . Compression fracture of L2 ([place]) 05/30/2016  . Elevated blood pressure 05/30/2016  . L2 vertebral fracture ([place]) 05/30/2016  . Anxiety state 12/10/2014  . Neurogenic bladder 11/30/2014  . Neurogenic bowel 11/30/2014  . Bacterial UTI 11/30/2014  . Thoracic myelopathy 11/29/2014  . Persistent atrial  fibrillation ([place]) 11/27/2014  . Traumatic spinal subdural hematoma   . Acute  pulmonary embolism ([place])   . Orthostatic hypotension 11/22/2014  . Near syncope 11/21/2014  . Paraplegia at T9 level ([place]) 11/17/2014  . Weakness of both legs   . Nocturnal leg cramps 07/16/2014  . Encounter for therapeutic drug monitoring 12/25/2013  . Malaise and fatigue 06/01/2011  . Atrial flutter ([place]) 03/10/2011  . Long term (current) use of anticoagulants 03/10/2011  . Benign hypertensive heart disease without heart failure 03/10/2011  . Hypercholesterolemia 03/10/2011  . Paroxysmal atrial flutter ([place])   . Palpitations   . PAC (premature atrial contraction)   . PVC's (premature ventricular contractions)   . Malaise   . Fatigue   . Myalgia     Kathreen Dileo W. 11/19/2016, 8:12 AM Frazier Butt., PT [facility] [address] [place], [place], [address] Phone: [phone]   Fax:  [phone]  Name: [name] Weber MRN: [id number] Date of Birth: [birth year]

## 2016-11-25 ENCOUNTER — Ambulatory Visit: Payer: Medicare Other | Admitting: Physical Therapy

## 2016-11-25 DIAGNOSIS — R2681 Unsteadiness on feet: Secondary | ICD-10-CM | POA: Diagnosis not present

## 2016-11-25 DIAGNOSIS — M6281 Muscle weakness (generalized): Secondary | ICD-10-CM

## 2016-11-25 DIAGNOSIS — R2689 Other abnormalities of gait and mobility: Secondary | ICD-10-CM

## 2016-11-25 NOTE — Therapy (Signed)
[facility] [address] [place], [place], [address] Phone: [phone]   Fax:  [phone]  Physical Therapy Treatment  Patient Details  Name: [name] Weber MRN: [id number] Date of Birth: 12-16-1930 Referring Provider: Naaman Plummer  Encounter Date: 11/25/2016      PT End of Session - 11/25/16 1515    Visit Number 13   Number of Visits 21  per recert 88/32/54   Date for PT Re-Evaluation 01/08/17   Authorization Type [employer] Medicare-GCODE every 10th visit   PT Start Time 0934   PT Stop Time 1014   PT Time Calculation (min) 40 min   Equipment Utilized During Treatment Gait belt   Activity Tolerance Patient tolerated treatment well   Behavior During Therapy [facility] for tasks assessed/performed      Past Medical History:  Diagnosis Date  . Fatigue   . Malaise   . Myalgia   . PAC (premature atrial contraction)    ISOLATED  . Palpitations    OCCASSIONAL  . Paroxysmal atrial flutter ([place])   . PVC's (premature ventricular contractions)    ISOLATED    Past Surgical History:  Procedure Laterality Date  . THORACIC LAMINECTOMY FOR EPIDURAL ABSCESS N/A 11/16/2014   Procedure: THORACIC LAMINECTOMY FOR EPIDURAL ABSCESS;  Surgeon: Floyce Stakes, MD;  Location: [facility];  Service: Neurosurgery;  Laterality: N/A;  . VULVAR LESION REMOVAL  01/01/2009   She had a 1-cm area of erythema to the right of the urethral meatus    There were no vitals filed for this visit.      Subjective Assessment - 11/25/16 0937    Subjective Have had a little back pain over the past few days.  Pain not bothering me today though   Patient is accompained by: Family member  Malachy Mood   Patient Stated Goals Pt wants to do whatever she needs to do to make back stronger.   Currently in Pain? No/denies               In parallel bars:  With widened BOS - alternating UE lifts x 10 reps             -With widened semi-tandem  BOS-alternating UE lifts, then head turns x 10, head nods x 10 rep with UE support -Single limb stance activities with intermittent 1-2 UE support             -forward step over obstacle, x 10 reps each leg             -forward/back rocking/lifting leg with obstacle in middle, for larger BOS and improved SLS             -side step over obstacle x 10 reps each leg             -side/side rocking with obstacle between legs, for improved BOS and improved SLS                          [facility] Adult PT Treatment/Exercise - 11/25/16 0001      Ambulation/Gait   Ambulation/Gait Yes   Ambulation/Gait Assistance 5: Supervision   Ambulation Distance (Feet) 160 Feet  then 80 ft   Assistive device Rolling walker   Gait Pattern Step-through pattern;Decreased step length - right;Decreased step length - left;Decreased dorsiflexion - right;Decreased dorsiflexion - left;Right steppage;Left steppage   Ambulation Surface Level;Indoor   Stairs Yes   Stairs Assistance 5: Supervision;4: Min  assist   Stairs Assistance Details (indicate cue type and reason) with bilateral rails, pt performs with supervision, step-to pattern.  With one rail and HHA simulating pool entry, pt performs with step-through pattern ascending and step to pattern descending, min assist, 3 reps.  Cues provided for step to pattern to ascend steps for safety.   Stair Management Technique One rail Right;One rail Left;Two rails;Alternating pattern;Step to pattern   Number of Stairs 4  4 reps   Height of Stairs 6    Pt doffs AFOs and puts on water shoes to simulate getting in and out of the pool.  With short distance gait activities and stairs as above, pt wears water shoes. PT assists patient in donning AFOs with verbal cues for proper positioning for donning.         Balance Exercises - 11/25/16 0938      Balance Exercises: Standing   Standing Eyes Opened Narrow base of support (BOS);Wide (BOA);Solid surface;Head turns  30 sec  feet together, 120 sec feet apart, head nods 10 reps   Step Ups Forward;4 inch;UE support 1  15 reps each leg             PT Short Term Goals - 11/10/16 1335      PT SHORT TERM GOAL #1   Title Pt will be independent with updated/progressive HEP for improved strength, balance, and gait.  UPDATED TARGET 11/27/16   Baseline per recert 53/29/92   Time 4   Period Weeks   Status Revised     PT SHORT TERM GOAL #2   Title Pt will improve TUG score to less than or equal to 20 seconds for decreased fall risk.   Baseline 19.56 sec with RW on TUG 10/07/16   Time 4   Period Weeks   Status Achieved     PT SHORT TERM GOAL #3   Title Pt will improve Berg Balance score to at least 24/56 for decreased fall risk.   Time 4   Period Weeks   Status Not Met     PT SHORT TERM GOAL #4   Title Pt will perform at least 5 minutes of standing activities with intermittent UE support for improved participation in ADLs and household activities.   Time 4   Period Weeks   Status Achieved           PT Long Term Goals - 11/10/16 1245      PT LONG TERM GOAL #1   Title Pt will verbalize understanding of fall prevention within the home environemnt.  TARGET 11/08/16  GOAL ONGOING-UPDATED TARGET 12/18/16   Time 6   Period Weeks   Status On-going     PT LONG TERM GOAL #2   Title Pt will improve TUG score to less than or equal to 17 seconds for decreased fall risk.  GOAL UPDATED TUG scores <or equal to 15 seconds for decreased fall risk.  TARGET 12/18/16   Baseline 16.87 seconds with Rollator on 11/10/16   Time 6   Period Weeks   Status Revised     PT LONG TERM GOAL #3   Title Pt will improve Berg balance score to at least 29/56 for decreased fall risk.  GOAL ONGOING-UPDATED TARGET 12/18/16   Time 6   Period Weeks   Status On-going     PT LONG TERM GOAL #4   Title Pt will improve gait velocity to at least 2.4 ft/sec for improved gait efficiency and safety with household and community  gait.  GOAL  UPDATED Gait velocity at least 2.62 ft/sec for improved community gait.  TARGET 12/18/16   Baseline 2.44 ft/sec with Rollator on 11/10/16   Time 6   Period Weeks   Status Revised     PT LONG TERM GOAL #5   Title Pt will verbalize understanding of transition to community fitness upon D/C from PT.  ONGOING GOAL UPDATED TARGET 12/18/16   Time 8   Period Weeks   Status On-going               Plan - 11/25/16 1515    Clinical Impression Statement Pt able to decrease UE support with standing balance activities in parallel bars, with continued min guard assistance for safety.  Pt continues to benefit from skilled PT to address balance, gait and strengthening.   Rehab Potential Good   PT Frequency 2x / week   PT Duration 6 weeks  per recert 41/58/30   PT Treatment/Interventions ADLs/Self Care Home Management;Functional mobility training;Gait training;DME Instruction;Therapeutic activities;Therapeutic exercise;Balance training;Neuromuscular re-education;Patient/family education;Orthotic Fit/Training   PT Next Visit Plan Continue to work on standing balance activities, step negotiation.  (This is week 3 of 6 in POC)   Consulted and Agree with Plan of Care Patient;Other (Comment)   Family Member Consulted [name]      Patient will benefit from skilled therapeutic intervention in order to improve the following deficits and impairments:  Abnormal gait, Decreased balance, Decreased mobility, Decreased range of motion, Decreased safety awareness, Decreased strength, Difficulty walking, Impaired flexibility, Postural dysfunction  Visit Diagnosis: Unsteadiness on feet  Muscle weakness (generalized)  Other abnormalities of gait and mobility     Problem List Patient Active Problem List   Diagnosis Date Noted  . Memory loss   . Lumbar burst fracture ([place]) 06/08/2016  . Lumbar compression fracture ([place]) 06/08/2016  . Bilateral foot-drop   . Midline thoracic back pain   . PAF  (paroxysmal atrial fibrillation) ([place])   . Depression   . Constipation due to pain medication   . Chronic constipation   . Compression fracture of L2 ([place]) 05/30/2016  . Elevated blood pressure 05/30/2016  . L2 vertebral fracture ([place]) 05/30/2016  . Anxiety state 12/10/2014  . Neurogenic bladder 11/30/2014  . Neurogenic bowel 11/30/2014  . Bacterial UTI 11/30/2014  . Thoracic myelopathy 11/29/2014  . Persistent atrial fibrillation ([place]) 11/27/2014  . Traumatic spinal subdural hematoma   . Acute pulmonary embolism ([place])   . Orthostatic hypotension 11/22/2014  . Near syncope 11/21/2014  . Paraplegia at T9 level ([place]) 11/17/2014  . Weakness of both legs   . Nocturnal leg cramps 07/16/2014  . Encounter for therapeutic drug monitoring 12/25/2013  . Malaise and fatigue 06/01/2011  . Atrial flutter ([place]) 03/10/2011  . Long term (current) use of anticoagulants 03/10/2011  . Benign hypertensive heart disease without heart failure 03/10/2011  . Hypercholesterolemia 03/10/2011  . Paroxysmal atrial flutter ([place])   . Palpitations   . PAC (premature atrial contraction)   . PVC's (premature ventricular contractions)   . Malaise   . Fatigue   . Myalgia     Wess Baney W. 11/25/2016, 3:18 PM  Frazier Butt., PT  [facility] [address] [place], [place], [address] Phone: [phone]   Fax:  [phone]  Name: BRELEIGH CARPINO MRN: [id number] Date of Birth: 06-Feb-1931

## 2016-11-27 ENCOUNTER — Ambulatory Visit: Payer: Medicare Other | Admitting: Physical Therapy

## 2016-11-27 DIAGNOSIS — R2681 Unsteadiness on feet: Secondary | ICD-10-CM

## 2016-11-27 DIAGNOSIS — R2689 Other abnormalities of gait and mobility: Secondary | ICD-10-CM

## 2016-11-27 NOTE — Therapy (Signed)
[facility] [address] [place], [place], [address] Phone: [phone]   Fax:  [phone]  Physical Therapy Treatment  Patient Details  Name: [name] Weber MRN: [id number] Date of Birth: 1931-09-20 Referring Provider: Naaman Plummer  Encounter Date: 11/27/2016      PT End of Session - 11/27/16 1111    Visit Number 14   Number of Visits 21  per recert 69/62/95   Date for PT Re-Evaluation 01/08/17   Authorization Type [employer]HC Medicare-GCODE every 10th visit   PT Start Time 1018   PT Stop Time 1059   PT Time Calculation (min) 41 min   Equipment Utilized During Treatment Gait belt   Activity Tolerance Patient tolerated treatment well   Behavior During Therapy [facility] for tasks assessed/performed      Past Medical History:  Diagnosis Date  . Fatigue   . Malaise   . Myalgia   . PAC (premature atrial contraction)    ISOLATED  . Palpitations    OCCASSIONAL  . Paroxysmal atrial flutter ([place])   . PVC's (premature ventricular contractions)    ISOLATED    Past Surgical History:  Procedure Laterality Date  . THORACIC LAMINECTOMY FOR EPIDURAL ABSCESS N/A 11/16/2014   Procedure: THORACIC LAMINECTOMY FOR EPIDURAL ABSCESS;  Surgeon: Floyce Stakes, MD;  Location: [facility];  Service: Neurosurgery;  Laterality: N/A;  . VULVAR LESION REMOVAL  01/01/2009   She had a 1-cm area of erythema to the right of the urethral meatus    There were no vitals filed for this visit.      Subjective Assessment - 11/27/16 1019    Subjective Nothing's bothering me today.   Patient is accompained by: Family member  [name] Weber   Patient Stated Goals Pt wants to do whatever she needs to do to make back stronger.   Currently in Pain? No/denies                         [facility] Adult PT Treatment/Exercise - 11/27/16 0001      Ambulation/Gait   Ambulation/Gait Yes   Ambulation/Gait Assistance 5: Supervision   Ambulation/Gait Assistance Details Gait with starts/stops, head turns and nods for environmental scanning tasks, no LOB   Ambulation Distance (Feet) 180 Feet  then 350   Assistive device Rolling walker   Gait Pattern Step-through pattern;Decreased step length - right;Decreased step length - left;Decreased dorsiflexion - right;Decreased dorsiflexion - left  wears bilateral AFOs   Ambulation Surface Level;Indoor             Balance Exercises - 11/27/16 1037      Balance Exercises: Standing   Standing Eyes Opened Narrow base of support (BOS);Wide (BOA);Solid surface;Head turns;Time  60 sec feet together, 120 sec feet apart; head nods, x 10   SLS Eyes open;Solid surface;Upper extremity support 2  standing on 4" step, opposite foot steps forward/back   Standing, One Foot on a Step Eyes open;Foam/compliant surface;4 inch  intermittent UE support, alternating UE lifts x 10   Stepping Strategy Anterior;Lateral;UE support;Other reps (comment)  Stepping over obstacle, x 15 reps   Step Ups Forward;4 inch;UE support 1   Retro Gait Upper extremity support;5 reps  in parallel bars      In parallel bars: With widened BOS - alternating UE lifts x 10 reps -With widened semi-tandem BOS-alternating UE lifts, then head turns x 10, head nods x 10 rep with UE support -Single limb stance activities with  intermittent 1-2 UE support -forward step over obstacle, x 15 reps each leg, alternating lower extremities -forward/back rocking/lifting leg with obstacle in middle, 15 reps each leg, for larger BOS and improved SLS -side step over obstacle x 15 reps each leg, then side step over R and L x 15 reps each leg -side/side rocking with obstacle between legs, for improved BOS and improved SLS, x 15 reps   Standing with RW in front of patient, with min guard assistance-tapping with open hand to boxing bag, including high/low, and then crossover  taps, progressing to sidestep with RW then tapping to boxing bag.  No LOB noted with min guard assistance.         PT Short Term Goals - 11/10/16 1335      PT SHORT TERM GOAL #1   Title Pt will be independent with updated/progressive HEP for improved strength, balance, and gait.  UPDATED TARGET 11/27/16   Baseline per recert 58/30/94   Time 4   Period Weeks   Status Revised     PT SHORT TERM GOAL #2   Title Pt will improve TUG score to less than or equal to 20 seconds for decreased fall risk.   Baseline 19.56 sec with RW on TUG 10/07/16   Time 4   Period Weeks   Status Achieved     PT SHORT TERM GOAL #3   Title Pt will improve Berg Balance score to at least 24/56 for decreased fall risk.   Time 4   Period Weeks   Status Not Met     PT SHORT TERM GOAL #4   Title Pt will perform at least 5 minutes of standing activities with intermittent UE support for improved participation in ADLs and household activities.   Time 4   Period Weeks   Status Achieved           PT Long Term Goals - 11/10/16 1245      PT LONG TERM GOAL #1   Title Pt will verbalize understanding of fall prevention within the home environemnt.  TARGET 11/08/16  GOAL ONGOING-UPDATED TARGET 12/18/16   Time 6   Period Weeks   Status On-going     PT LONG TERM GOAL #2   Title Pt will improve TUG score to less than or equal to 17 seconds for decreased fall risk.  GOAL UPDATED TUG scores <or equal to 15 seconds for decreased fall risk.  TARGET 12/18/16   Baseline 16.87 seconds with Rollator on 11/10/16   Time 6   Period Weeks   Status Revised     PT LONG TERM GOAL #3   Title Pt will improve Berg balance score to at least 29/56 for decreased fall risk.  GOAL ONGOING-UPDATED TARGET 12/18/16   Time 6   Period Weeks   Status On-going     PT LONG TERM GOAL #4   Title Pt will improve gait velocity to at least 2.4 ft/sec for improved gait efficiency and safety with household and community gait.  GOAL UPDATED Gait  velocity at least 2.62 ft/sec for improved community gait.  TARGET 12/18/16   Baseline 2.44 ft/sec with Rollator on 11/10/16   Time 6   Period Weeks   Status Revised     PT LONG TERM GOAL #5   Title Pt will verbalize understanding of transition to community fitness upon D/C from PT.  ONGOING GOAL UPDATED TARGET 12/18/16   Time 8   Period Weeks   Status On-going  Plan - 11/27/16 1301    Clinical Impression Statement Pt able to tolerate increased reps of standing exercises, continued to need UE support for standing activities in parallel bars.  Able to tolerate dynamic standing activities with RW nearby with boxing activity with min guard assistance.  Pt will continue to benefit from further skilled PT to address balance, gait and strengthening.   Rehab Potential Good   PT Frequency 2x / week   PT Duration 6 weeks  per recert 81/59/47   PT Treatment/Interventions ADLs/Self Care Home Management;Functional mobility training;Gait training;DME Instruction;Therapeutic activities;Therapeutic exercise;Balance training;Neuromuscular re-education;Patient/family education;Orthotic Fit/Training   PT Next Visit Plan Continue to work on standing balance activities, step negotiation.  (This is week 3 of 6 in POC)  Work on dynamic standing balance activities-boxing bag, Zoomball   Consulted and Agree with Plan of Care Patient;Other (Comment)   Family Member Consulted [name]    Check on STG for HEP-next visit-AWM 11/27/16  Patient will benefit from skilled therapeutic intervention in order to improve the following deficits and impairments:  Abnormal gait, Decreased balance, Decreased mobility, Decreased range of motion, Decreased safety awareness, Decreased strength, Difficulty walking, Impaired flexibility, Postural dysfunction  Visit Diagnosis: Unsteadiness on feet  Other abnormalities of gait and mobility     Problem List Patient Active Problem List   Diagnosis Date  Noted  . Memory loss   . Lumbar burst fracture ([place]) 06/08/2016  . Lumbar compression fracture ([place]) 06/08/2016  . Bilateral foot-drop   . Midline thoracic back pain   . PAF (paroxysmal atrial fibrillation) ([place])   . Depression   . Constipation due to pain medication   . Chronic constipation   . Compression fracture of L2 ([place]) 05/30/2016  . Elevated blood pressure 05/30/2016  . L2 vertebral fracture ([place]) 05/30/2016  . Anxiety state 12/10/2014  . Neurogenic bladder 11/30/2014  . Neurogenic bowel 11/30/2014  . Bacterial UTI 11/30/2014  . Thoracic myelopathy 11/29/2014  . Persistent atrial fibrillation ([place]) 11/27/2014  . Traumatic spinal subdural hematoma   . Acute pulmonary embolism ([place])   . Orthostatic hypotension 11/22/2014  . Near syncope 11/21/2014  . Paraplegia at T9 level ([place]) 11/17/2014  . Weakness of both legs   . Nocturnal leg cramps 07/16/2014  . Encounter for therapeutic drug monitoring 12/25/2013  . Malaise and fatigue 06/01/2011  . Atrial flutter ([place]) 03/10/2011  . Long term (current) use of anticoagulants 03/10/2011  . Benign hypertensive heart disease without heart failure 03/10/2011  . Hypercholesterolemia 03/10/2011  . Paroxysmal atrial flutter ([place])   . Palpitations   . PAC (premature atrial contraction)   . PVC's (premature ventricular contractions)   . Malaise   . Fatigue   . Myalgia     [name] Fortin W. 11/27/2016, 1:04 PM  Frazier Butt., PT  [facility] [address] [place], [place], [address] Phone: [phone]   Fax:  [phone]  Name: [name] Weber MRN: [id number] Date of Birth: 01-24-1931

## 2016-12-02 ENCOUNTER — Ambulatory Visit: Payer: Medicare Other | Attending: Physical Medicine & Rehabilitation | Admitting: Physical Therapy

## 2016-12-02 DIAGNOSIS — M6281 Muscle weakness (generalized): Secondary | ICD-10-CM

## 2016-12-02 DIAGNOSIS — R2689 Other abnormalities of gait and mobility: Secondary | ICD-10-CM | POA: Diagnosis present

## 2016-12-02 DIAGNOSIS — R2681 Unsteadiness on feet: Secondary | ICD-10-CM

## 2016-12-02 NOTE — Therapy (Signed)
[place] [address] [place], [place], [address] Phone: [phone]   Fax:  [phone]  Physical Therapy Treatment  Patient Details  Name: [name] Weber MRN: [id number] Date of Birth: 1931/01/31 Referring Provider: Naaman Plummer  Encounter Date: 12/02/2016      PT End of Session - 12/02/16 1652    Visit Number 15   Number of Visits 21  per recert 40/34/74   Date for PT Re-Evaluation 01/08/17   Authorization Type UHC Medicare-GCODE every 10th visit   PT Start Time 1104   PT Stop Time 1147   PT Time Calculation (min) 43 min   Equipment Utilized During Treatment Gait belt   Activity Tolerance Patient tolerated treatment well   Behavior During Therapy [facility] for tasks assessed/performed      Past Medical History:  Diagnosis Date  . Fatigue   . Malaise   . Myalgia   . PAC (premature atrial contraction)    ISOLATED  . Palpitations    OCCASSIONAL  . Paroxysmal atrial flutter ([place])   . PVC's (premature ventricular contractions)    ISOLATED    Past Surgical History:  Procedure Laterality Date  . THORACIC LAMINECTOMY FOR EPIDURAL ABSCESS N/A 11/16/2014   Procedure: THORACIC LAMINECTOMY FOR EPIDURAL ABSCESS;  Surgeon: Floyce Stakes, MD;  Location: [facility];  Service: Neurosurgery;  Laterality: N/A;  . VULVAR LESION REMOVAL  01/01/2009   She had a 1-cm area of erythema to the right of the urethral meatus    There were no vitals filed for this visit.      Subjective Assessment - 12/02/16 1105    Subjective No pain, no falls.  Sometimes have pain in the later part of the day.    Patient is accompained by: Family member  Malachy Mood   Patient Stated Goals Pt wants to do whatever she needs to do to make back stronger.   Currently in Pain? No/denies                         [facility] Adult PT Treatment/Exercise - 12/02/16 0001      Ambulation/Gait   Ambulation/Gait Yes   Ambulation/Gait  Assistance 5: Supervision   Ambulation/Gait Assistance Details Cues with gait for upright posture, decreased forward lean on RW   Ambulation Distance (Feet) 500 Feet  then 100 ft, then 110 ft   Assistive device Rolling walker   Gait Pattern Step-through pattern;Decreased step length - right;Decreased step length - left;Decreased dorsiflexion - right;Decreased dorsiflexion - left  wearing bilateral AFOs   Ambulation Surface Level;Indoor   Stairs Yes   Stairs Assistance 4: Min assist   Stair Management Technique One rail Right;One rail Left;Alternating pattern;Step to pattern  alternating ascending, step-to descending   Number of Stairs 4  x 3   Height of Stairs 6             Balance Exercises - 12/02/16 1238      Balance Exercises: Standing   Standing Eyes Opened Narrow base of support (BOS);Wide (BOA);Solid surface;Head turns;Time  60 sec feet together; 120 sec feet apart   SLS Eyes open;Solid surface;Upper extremity support 2  standing on 6" step-opposite foot steps forward/back   Standing, One Foot on a Step Eyes open;6 inch  solid surface, alternating UE lifts 10 reps   Stepping Strategy Anterior;Lateral;UE support;Other reps (comment)  stepping over obstacle, x 15 reps   Step Ups Forward;6 inch;UE support 1  15  reps   Other Standing Exercises Standing on foam-marching in place, forward kicks, forward step taps x 15 reps each leg with UE support      In parallel bars: With widened BOS - alternating UE lifts x 10 reps -Head nods x 10 rep with no UE support -Single limb stance activities with intermittent 1-2 UE support -side step over obstacle x 15 reps each leg, then side step over R and L x 15 reps each leg -side/side rocking with obstacle between legs, for improved BOS and improved SLS, x 15 reps   Pt dons boxing gloves, and takes several steps from chair to boxing bag with min assist; With min guard assistance-alternating  jabs to boxing bag, including high/low, and then crossover taps, with therapist providing tactile cues at hips for lateral weightshifting while standing; with progression to sidestep R and L (around bag with UE supported at top of boxing bag), followed by continued jabs to boxing bag.  No LOB noted with min guard assistance.  Seated Zoomball activity x 5 reps for improved posture; standing at mat table, with therapist min/mod assistance and legs blocked at mat table, tech assist to perform Zoomball activity x 4 reps, with pt relying on legs propped on mat and quick reaches to walker for UE support.        PT Short Term Goals - 12/02/16 1700      PT SHORT TERM GOAL #1   Title Pt will be independent with updated/progressive HEP for improved strength, balance, and gait.  UPDATED TARGET 11/27/16   Baseline per recert 34/28/76   Time 4   Period Weeks   Status Achieved     PT SHORT TERM GOAL #2   Title Pt will improve TUG score to less than or equal to 20 seconds for decreased fall risk.   Baseline 19.56 sec with RW on TUG 10/07/16   Time 4   Period Weeks   Status Achieved     PT SHORT TERM GOAL #3   Title Pt will improve Berg Balance score to at least 24/56 for decreased fall risk.   Time 4   Period Weeks   Status Not Met     PT SHORT TERM GOAL #4   Title Pt will perform at least 5 minutes of standing activities with intermittent UE support for improved participation in ADLs and household activities.   Time 4   Period Weeks   Status Achieved           PT Long Term Goals - 11/10/16 1245      PT LONG TERM GOAL #1   Title Pt will verbalize understanding of fall prevention within the home environemnt.  TARGET 11/08/16  GOAL ONGOING-UPDATED TARGET 12/18/16   Time 6   Period Weeks   Status On-going     PT LONG TERM GOAL #2   Title Pt will improve TUG score to less than or equal to 17 seconds for decreased fall risk.  GOAL UPDATED TUG scores <or equal to 15 seconds for decreased  fall risk.  TARGET 12/18/16   Baseline 16.87 seconds with Rollator on 11/10/16   Time 6   Period Weeks   Status Revised     PT LONG TERM GOAL #3   Title Pt will improve Berg balance score to at least 29/56 for decreased fall risk.  GOAL ONGOING-UPDATED TARGET 12/18/16   Time 6   Period Weeks   Status On-going     PT LONG TERM GOAL #  4   Title Pt will improve gait velocity to at least 2.4 ft/sec for improved gait efficiency and safety with household and community gait.  GOAL UPDATED Gait velocity at least 2.62 ft/sec for improved community gait.  TARGET 12/18/16   Baseline 2.44 ft/sec with Rollator on 11/10/16   Time 6   Period Weeks   Status Revised     PT LONG TERM GOAL #5   Title Pt will verbalize understanding of transition to community fitness upon D/C from PT.  ONGOING GOAL UPDATED TARGET 12/18/16   Time 8   Period Weeks   Status On-going               Plan - 12/02/16 1656    Clinical Impression Statement Pt continues to tolerate standing activities with supervision/min guard and minimal UE support.  Pt does have difficulty maintaining balance with dynamic standing Zoomball activity without mod assist and quick reaching out for support.  Had brief discussion at end of session about POC and possibility of transitioning towards community fitness at [facility] in several weeks.  Per HEP review several visits ago, pt has met revised STG 1.  AWM-12/02/16   Rehab Potential Good   PT Frequency 2x / week   PT Duration 6 weeks  per recert 95/18/84   PT Treatment/Interventions ADLs/Self Care Home Management;Functional mobility training;Gait training;DME Instruction;Therapeutic activities;Therapeutic exercise;Balance training;Neuromuscular re-education;Patient/family education;Orthotic Fit/Training   PT Next Visit Plan Continue to work on standing balance activities, step negotiation.  (This is week 4 of 6 in POC)  Work on dynamic standing balance activities-boxing bag, seated machines in prep  for return to [employer] and Agree with Plan of Care Patient;Other (Comment)   Family Member Consulted [name]      Patient will benefit from skilled therapeutic intervention in order to improve the following deficits and impairments:  Abnormal gait, Decreased balance, Decreased mobility, Decreased range of motion, Decreased safety awareness, Decreased strength, Difficulty walking, Impaired flexibility, Postural dysfunction  Visit Diagnosis: Unsteadiness on feet  Muscle weakness (generalized)  Other abnormalities of gait and mobility     Problem List Patient Active Problem List   Diagnosis Date Noted  . Memory loss   . Lumbar burst fracture ([place]) 06/08/2016  . Lumbar compression fracture ([place]) 06/08/2016  . Bilateral foot-drop   . Midline thoracic back pain   . PAF (paroxysmal atrial fibrillation) ([place])   . Depression   . Constipation due to pain medication   . Chronic constipation   . Compression fracture of L2 ([place]) 05/30/2016  . Elevated blood pressure 05/30/2016  . L2 vertebral fracture ([place]) 05/30/2016  . Anxiety state 12/10/2014  . Neurogenic bladder 11/30/2014  . Neurogenic bowel 11/30/2014  . Bacterial UTI 11/30/2014  . Thoracic myelopathy 11/29/2014  . Persistent atrial fibrillation ([place]) 11/27/2014  . Traumatic spinal subdural hematoma   . Acute pulmonary embolism ([place])   . Orthostatic hypotension 11/22/2014  . Near syncope 11/21/2014  . Paraplegia at T9 level ([name]) 11/17/2014  . Weakness of both legs   . Nocturnal leg cramps 07/16/2014  . Encounter for therapeutic drug monitoring 12/25/2013  . Malaise and fatigue 06/01/2011  . Atrial flutter ([place]) 03/10/2011  . Long term (current) use of anticoagulants 03/10/2011  . Benign hypertensive heart disease without heart failure 03/10/2011  . Hypercholesterolemia 03/10/2011  . Paroxysmal atrial flutter ([place])   . Palpitations   . PAC (premature atrial contraction)   . PVC's (premature ventricular  contractions)   . Malaise   .  Fatigue   . Myalgia     Joel Cowin W. 12/02/2016, 5:02 PM  Frazier Butt., PT  [place] [address] [place], [place], [address] Phone: [phone]   Fax:  [phone]  Name: [name] Weber MRN: [id number] Date of Birth: Apr 09, 1931

## 2016-12-04 ENCOUNTER — Ambulatory Visit: Payer: Medicare Other | Admitting: Physical Therapy

## 2016-12-04 DIAGNOSIS — R2681 Unsteadiness on feet: Secondary | ICD-10-CM | POA: Diagnosis not present

## 2016-12-04 DIAGNOSIS — M6281 Muscle weakness (generalized): Secondary | ICD-10-CM

## 2016-12-04 DIAGNOSIS — R2689 Other abnormalities of gait and mobility: Secondary | ICD-10-CM

## 2016-12-04 NOTE — Therapy (Signed)
[place] [address] [place], [place], [address] Phone: [phone]   Fax:  [phone]  Physical Therapy Treatment  Patient Details  Name: [name] Weber MRN: [id number] Date of Birth: Aug 16, 1931 Referring Provider: Naaman Plummer  Encounter Date: 12/04/2016      PT End of Session - 12/04/16 2951    Visit Number 16   Number of Visits 21  per recert 88/41/66   Date for PT Re-Evaluation 01/08/17   Authorization Type UHC Medicare-GCODE every 10th visit   PT Start Time 1110   PT Stop Time 1155   PT Time Calculation (min) 45 min   Equipment Utilized During Treatment Gait belt   Activity Tolerance Patient tolerated treatment well   Behavior During Therapy [facility] for tasks assessed/performed      Past Medical History:  Diagnosis Date  . Fatigue   . Malaise   . Myalgia   . PAC (premature atrial contraction)    ISOLATED  . Palpitations    OCCASSIONAL  . Paroxysmal atrial flutter ([place])   . PVC's (premature ventricular contractions)    ISOLATED    Past Surgical History:  Procedure Laterality Date  . THORACIC LAMINECTOMY FOR EPIDURAL ABSCESS N/A 11/16/2014   Procedure: THORACIC LAMINECTOMY FOR EPIDURAL ABSCESS;  Surgeon: Floyce Stakes, MD;  Location: [facility];  Service: Neurosurgery;  Laterality: N/A;  . VULVAR LESION REMOVAL  01/01/2009   She had a 1-cm area of erythema to the right of the urethral meatus    There were no vitals filed for this visit.      Subjective Assessment - 12/04/16 1357    Subjective Nothing new since last visit.  Malachy Mood, does remind patient that she c/o pain later in day yesterday.  Pt unsure of what she did to bring on back pain.   Patient is accompained by: Family member  Malachy Mood   Patient Stated Goals Pt wants to do whatever she needs to do to make back stronger.   Currently in Pain? No/denies                         [facility] Adult PT  Treatment/Exercise - 12/04/16 0001      Ambulation/Gait   Ambulation/Gait Yes   Ambulation/Gait Assistance 5: Supervision   Ambulation/Gait Assistance Details Quick turns, changes of direction, narrow spaces, environmental scanning tasks with gait.   Ambulation Distance (Feet) 700 Feet  plus, including negotiating furniture   Assistive device Rolling walker   Gait Pattern Step-through pattern;Decreased step length - right;Decreased step length - left;Decreased dorsiflexion - right;Decreased dorsiflexion - left  wears bilateral AFOs   Ambulation Surface Level;Indoor   Stairs Yes   Stairs Assistance 4: Min guard  HHA and rail   Stair Management Technique One rail Right;One rail Left;Alternating pattern;Step to pattern  HHA; alternating pattern ascending; step to descending   Number of Stairs 4  4 reps   Height of Stairs 6     Exercises   Exercises Knee/Hip     Knee/Hip Exercises: Aerobic   Other Aerobic Scifit, Level 2, 7 minutes, lower extremities>all 4 extremities for lower extremity strengthening     Knee/Hip Exercises: Machines for Strengthening   Total Gym Leg Press 30 #, bilateral lower extremities, 2 sets x 10 reps             Balance Exercises - 12/04/16 1400      Balance Exercises: Standing   SLS Eyes  open;Foam/compliant surface;Upper extremity support 2  standing on foam-opposite foot goes fwd/back x 15   Stepping Strategy Anterior;Lateral;UE support;Other reps (comment)  stepping over obstacle, alternating steps   Tandem Gait Forward;Upper extremity support;2 reps  Progress to march>tandem march, 2 reps, parallel bars   Other Standing Exercises Standing on foam-marching in place, forward kicks, forward step taps x 15 reps each leg with UE support      Single limb stance activities with intermittent 1-2 UE support -side step over obstacle x 15reps each leg, then side step over R and L x 15 reps each leg -side/side rocking with  obstacle between legs, for improved BOS and improved SLS, x 15 reps  Standing with feet narrow BOS 60 seconds no UE support, then tandem stance x 30 seconds, with min guard assistance, no UE support  With balance activities in parallel bars, therapist provides close supervision>min guard assistance, with cues for 1-2 UE support and tightened abdominal muscles.      PT Short Term Goals - 12/02/16 1700      PT SHORT TERM GOAL #1   Title Pt will be independent with updated/progressive HEP for improved strength, balance, and gait.  UPDATED TARGET 11/27/16   Baseline per recert 50/53/97   Time 4   Period Weeks   Status Achieved     PT SHORT TERM GOAL #2   Title Pt will improve TUG score to less than or equal to 20 seconds for decreased fall risk.   Baseline 19.56 sec with RW on TUG 10/07/16   Time 4   Period Weeks   Status Achieved     PT SHORT TERM GOAL #3   Title Pt will improve Berg Balance score to at least 24/56 for decreased fall risk.   Time 4   Period Weeks   Status Not Met     PT SHORT TERM GOAL #4   Title Pt will perform at least 5 minutes of standing activities with intermittent UE support for improved participation in ADLs and household activities.   Time 4   Period Weeks   Status Achieved           PT Long Term Goals - 11/10/16 1245      PT LONG TERM GOAL #1   Title Pt will verbalize understanding of fall prevention within the home environemnt.  TARGET 11/08/16  GOAL ONGOING-UPDATED TARGET 12/18/16   Time 6   Period Weeks   Status On-going     PT LONG TERM GOAL #2   Title Pt will improve TUG score to less than or equal to 17 seconds for decreased fall risk.  GOAL UPDATED TUG scores <or equal to 15 seconds for decreased fall risk.  TARGET 12/18/16   Baseline 16.87 seconds with Rollator on 11/10/16   Time 6   Period Weeks   Status Revised     PT LONG TERM GOAL #3   Title Pt will improve Berg balance score to at least 29/56 for decreased fall risk.  GOAL  ONGOING-UPDATED TARGET 12/18/16   Time 6   Period Weeks   Status On-going     PT LONG TERM GOAL #4   Title Pt will improve gait velocity to at least 2.4 ft/sec for improved gait efficiency and safety with household and community gait.  GOAL UPDATED Gait velocity at least 2.62 ft/sec for improved community gait.  TARGET 12/18/16   Baseline 2.44 ft/sec with Rollator on 11/10/16   Time 6   Period Weeks  Status Revised     PT LONG TERM GOAL #5   Title Pt will verbalize understanding of transition to community fitness upon D/C from PT.  ONGOING GOAL UPDATED TARGET 12/18/16   Time 8   Period Weeks   Status On-going               Plan - 12/04/16 1404    Clinical Impression Statement Worked on aerobic and lower extremity strengthening with use of machines this visit to assist in prep for transition back to [facility] in coming weeks.  Cues provided throughout session for use of abdominal muscles for upright posture.  Discussed pt's back pain at home in evenings, and reiterated importance of abdominal activation and slowed, deliberate performance of standing exercises with best posture.   Rehab Potential Good   PT Frequency 2x / week   PT Duration 6 weeks  per recert 34/19/62   PT Treatment/Interventions ADLs/Self Care Home Management;Functional mobility training;Gait training;DME Instruction;Therapeutic activities;Therapeutic exercise;Balance training;Neuromuscular re-education;Patient/family education;Orthotic Fit/Training   PT Next Visit Plan Continue to work on standing balance activities, step negotiation.  (This is week 4 of 6 in POC)  Work on dynamic standing balance activities-boxing bag, seated machines in prep for return to [employer] and Agree with Plan of Care Patient;Other (Comment)   Family Member Consulted [name]      Patient will benefit from skilled therapeutic intervention in order to improve the following deficits and impairments:  Abnormal gait, Decreased balance,  Decreased mobility, Decreased range of motion, Decreased safety awareness, Decreased strength, Difficulty walking, Impaired flexibility, Postural dysfunction  Visit Diagnosis: Unsteadiness on feet  Other abnormalities of gait and mobility  Muscle weakness (generalized)     Problem List Patient Active Problem List   Diagnosis Date Noted  . Memory loss   . Lumbar burst fracture ([place]) 06/08/2016  . Lumbar compression fracture ([place]) 06/08/2016  . Bilateral foot-drop   . Midline thoracic back pain   . PAF (paroxysmal atrial fibrillation) ([place])   . Depression   . Constipation due to pain medication   . Chronic constipation   . Compression fracture of L2 ([place]) 05/30/2016  . Elevated blood pressure 05/30/2016  . L2 vertebral fracture ([place]) 05/30/2016  . Anxiety state 12/10/2014  . Neurogenic bladder 11/30/2014  . Neurogenic bowel 11/30/2014  . Bacterial UTI 11/30/2014  . Thoracic myelopathy 11/29/2014  . Persistent atrial fibrillation ([place]) 11/27/2014  . Traumatic spinal subdural hematoma   . Acute pulmonary embolism ([place])   . Orthostatic hypotension 11/22/2014  . Near syncope 11/21/2014  . Paraplegia at T9 level ([place]) 11/17/2014  . Weakness of both legs   . Nocturnal leg cramps 07/16/2014  . Encounter for therapeutic drug monitoring 12/25/2013  . Malaise and fatigue 06/01/2011  . Atrial flutter ([place]) 03/10/2011  . Long term (current) use of anticoagulants 03/10/2011  . Benign hypertensive heart disease without heart failure 03/10/2011  . Hypercholesterolemia 03/10/2011  . Paroxysmal atrial flutter ([place])   . Palpitations   . PAC (premature atrial contraction)   . PVC's (premature ventricular contractions)   . Malaise   . Fatigue   . Myalgia     Alicha Raspberry W. 12/04/2016, 2:07 PM Frazier Butt., PT [place] [address] [place], [place], [address] Phone: [phone]   Fax:  [phone]  Name: FARHANA FELLOWS MRN: [id number] Date of Birth: [birth year]

## 2016-12-09 ENCOUNTER — Ambulatory Visit: Payer: Medicare Other | Admitting: Physical Therapy

## 2016-12-11 ENCOUNTER — Ambulatory Visit: Payer: Medicare Other | Admitting: Physical Therapy

## 2016-12-16 ENCOUNTER — Ambulatory Visit: Payer: Medicare Other | Admitting: Physical Therapy

## 2016-12-17 ENCOUNTER — Ambulatory Visit: Payer: Medicare Other | Admitting: Physical Therapy

## 2016-12-18 ENCOUNTER — Ambulatory Visit: Payer: Medicare Other | Admitting: Physical Therapy

## 2016-12-21 ENCOUNTER — Ambulatory Visit: Payer: Medicare Other | Admitting: Physical Therapy

## 2016-12-21 ENCOUNTER — Ambulatory Visit (INDEPENDENT_AMBULATORY_CARE_PROVIDER_SITE_OTHER): Payer: Medicare Other | Admitting: Internal Medicine

## 2016-12-21 ENCOUNTER — Encounter: Payer: Self-pay | Admitting: Internal Medicine

## 2016-12-21 VITALS — BP 97/69 | HR 79 | Ht 66.0 in | Wt 106.4 lb

## 2016-12-21 DIAGNOSIS — I480 Paroxysmal atrial fibrillation: Secondary | ICD-10-CM | POA: Diagnosis not present

## 2016-12-21 DIAGNOSIS — R2681 Unsteadiness on feet: Secondary | ICD-10-CM | POA: Diagnosis not present

## 2016-12-21 DIAGNOSIS — M6281 Muscle weakness (generalized): Secondary | ICD-10-CM

## 2016-12-21 NOTE — Patient Instructions (Signed)
Your physician recommends that you continue on your current medications as directed. Please refer to the Current Medication list given to you today. Your physician wants you to follow-up in: 1 year with Dr. Ross.  You will receive a reminder letter in the mail two months in advance. If you don't receive a letter, please call our office to schedule the follow-up appointment.  
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
## 2016-12-21 NOTE — Therapy (Signed)
[facility] [address] [place], [place], [address] Phone: [phone]   Fax:  [phone]  Physical Therapy Treatment  Patient Details  Name: [name] Weber MRN: [id number] Date of Birth: 1931/11/11 Referring Provider: Naaman Plummer  Encounter Date: 12/21/2016      PT End of Session - 12/21/16 2148    Visit Number 17   Number of Visits 21  per recert 63/81/77   Date for PT Re-Evaluation 01/08/17   Authorization Type UHC Medicare-GCODE every 10th visit   PT Start Time 1233   PT Stop Time 1313   PT Time Calculation (min) 40 min   Equipment Utilized During Treatment Gait belt   Activity Tolerance Patient tolerated treatment well   Behavior During Therapy [facility] for tasks assessed/performed      Past Medical History:  Diagnosis Date  . Fatigue   . Malaise   . Myalgia   . PAC (premature atrial contraction)    ISOLATED  . Palpitations    OCCASSIONAL  . Paroxysmal atrial flutter ([place])   . PVC's (premature ventricular contractions)    ISOLATED    Past Surgical History:  Procedure Laterality Date  . THORACIC LAMINECTOMY FOR EPIDURAL ABSCESS N/A 11/16/2014   Procedure: THORACIC LAMINECTOMY FOR EPIDURAL ABSCESS;  Surgeon: Floyce Stakes, MD;  Location: [facility];  Service: Neurosurgery;  Laterality: N/A;  . VULVAR LESION REMOVAL  01/01/2009   She had a 1-cm area of erythema to the right of the urethral meatus    There were no vitals filed for this visit.      Subjective Assessment - 12/21/16 1236    Subjective [place] okay at the beach-got out mainly to go out to eat.  Stayed in when it snowed.  No problems.   Patient is accompained by: Family member  Malachy Mood   Patient Stated Goals Pt wants to do whatever she needs to do to make back stronger.   Currently in Pain? No/denies                         [facility] Adult PT Treatment/Exercise - 12/21/16 1240      Exercises   Exercises Knee/Hip     Knee/Hip Exercises: Aerobic   Other Aerobic Scifit, Level 2.5, 8 minutes, 4 extremities for lower extremity strengthening     Knee/Hip Exercises: Standing   Forward Step Up Right;Left;1 set;20 reps;Hand Hold: 2   Functional Squat 1 set;10 reps  At [place] [place] for UE support   Other Standing Knee Exercises Sit<>stand x 10 reps from 22" height, for functional lower extremity strengthening.             Balance Exercises - 12/21/16 2145      Balance Exercises: Standing   Standing Eyes Opened Narrow base of support (BOS);Wide (BOA);Solid surface;Head turns;Head nods x 10 reps each position, intermittent UE support    Standing with no UE support: 60 sec feet together; 120 sec feet apart   Standing, One Foot on a Step Eyes open;6 inch;3 reps;10 secs  1 UE support; then alternating step taps x 15 reps each leg; side step taps x 10 reps each leg to 6" step   Stepping Strategy Anterior;Lateral;UE support;Other reps (comment)  stepping over obstacle, atlernating legs, 15 reps    Single limb stance activities with intermittent 1-2 UE support -side step over obstacle x 15reps each leg, then side step over R and L x 15 reps each leg  With standing one foot on a step:  Pt provides min guard assistance; addition of head turns/head nods with pt using 1 UE support   -Single limb stance, with multiple step taps, varied directions forward, side, back at 6 inch step in front, with 1-2 UE support, x 5 reps each side         PT Short Term Goals - 12/02/16 1700      PT SHORT TERM GOAL #1   Title Pt will be independent with updated/progressive HEP for improved strength, balance, and gait.  UPDATED TARGET 11/27/16   Baseline per recert 93/81/82   Time 4   Period Weeks   Status Achieved     PT SHORT TERM GOAL #2   Title Pt will improve TUG score to less than or equal to 20 seconds for decreased fall risk.   Baseline 19.56 sec with RW on TUG 10/07/16   Time 4   Period Weeks   Status  Achieved     PT SHORT TERM GOAL #3   Title Pt will improve Berg Balance score to at least 24/56 for decreased fall risk.   Time 4   Period Weeks   Status Not Met     PT SHORT TERM GOAL #4   Title Pt will perform at least 5 minutes of standing activities with intermittent UE support for improved participation in ADLs and household activities.   Time 4   Period Weeks   Status Achieved           PT Long Term Goals - 12/21/16 2149      PT LONG TERM GOAL #1   Title Pt will verbalize understanding of fall prevention within the home environemnt.  Updated TARGET 12/30/16 (as weeks remain in POC)   Time 6   Period Weeks   Status On-going     PT LONG TERM GOAL #2   Title Pt will improve TUG score to less than or equal to 17 seconds for decreased fall risk.  GOAL UPDATED TUG scores <or equal to 15 seconds for decreased fall risk.  TARGET 12/30/16   Baseline 16.87 seconds with Rollator on 11/10/16   Time 6   Period Weeks   Status Revised     PT LONG TERM GOAL #3   Title Pt will improve Berg balance score to at least 29/56 for decreased fall risk.  GOAL ONGOING-UPDATED TARGET 12/30/16   Time 6   Period Weeks   Status On-going     PT LONG TERM GOAL #4   Title Pt will improve gait velocity to at least 2.4 ft/sec for improved gait efficiency and safety with household and community gait.  GOAL UPDATED Gait velocity at least 2.62 ft/sec for improved community gait.  TARGET 12/30/16   Baseline 2.44 ft/sec with Rollator on 11/10/16   Time 6   Period Weeks   Status Revised     PT LONG TERM GOAL #5   Title Pt will verbalize understanding of transition to community fitness upon D/C from PT.  ONGOING GOAL UPDATED TARGET 12/30/16   Time 8   Period Weeks   Status On-going               Plan - 12/21/16 2151    Clinical Impression Statement Pt has missed the past 2 weeks due to being out of town and then due to inclement weather.  Modified goal date to next week, as visits remain in  POC-will work towards [place] with plans fordischarge  next week.  Pt continues to demonstrate need for UE support with challenging balance activities as well as with transition movements.   Rehab Potential Good   PT Frequency 2x / week   PT Duration 6 weeks  per recert 15/94/70   PT Treatment/Interventions ADLs/Self Care Home Management;Functional mobility training;Gait training;DME Instruction;Therapeutic activities;Therapeutic exercise;Balance training;Neuromuscular re-education;Patient/family education;Orthotic Fit/Training   PT Next Visit Plan Continue to work on standing balance activities, step negotiation.  (This is week 5 of 6 in POC)  Work on dynamic standing balance activities-boxing bag, seated machines in prep for return to [employer] and Agree with Plan of Care Patient      Patient will benefit from skilled therapeutic intervention in order to improve the following deficits and impairments:  Abnormal gait, Decreased balance, Decreased mobility, Decreased range of motion, Decreased safety awareness, Decreased strength, Difficulty walking, Impaired flexibility, Postural dysfunction  Visit Diagnosis: Unsteadiness on feet  Muscle weakness (generalized)     Problem List Patient Active Problem List   Diagnosis Date Noted  . Memory loss   . Lumbar burst fracture ([place]) 06/08/2016  . Lumbar compression fracture ([place]) 06/08/2016  . Bilateral foot-drop   . Midline thoracic back pain   . PAF (paroxysmal atrial fibrillation) ([place])   . Depression   . Constipation due to pain medication   . Chronic constipation   . Compression fracture of L2 ([place]) 05/30/2016  . Elevated blood pressure 05/30/2016  . L2 vertebral fracture ([place]) 05/30/2016  . Anxiety state 12/10/2014  . Neurogenic bladder 11/30/2014  . Neurogenic bowel 11/30/2014  . Bacterial UTI 11/30/2014  . Thoracic myelopathy 11/29/2014  . Persistent atrial fibrillation ([place]) 11/27/2014  . Traumatic spinal subdural  hematoma   . Acute pulmonary embolism ([place])   . Orthostatic hypotension 11/22/2014  . Near syncope 11/21/2014  . Paraplegia at T9 level ([place]) 11/17/2014  . Weakness of both legs   . Nocturnal leg cramps 07/16/2014  . Encounter for therapeutic drug monitoring 12/25/2013  . Malaise and fatigue 06/01/2011  . Atrial flutter ([place]) 03/10/2011  . Long term (current) use of anticoagulants 03/10/2011  . Benign hypertensive heart disease without heart failure 03/10/2011  . Hypercholesterolemia 03/10/2011  . Paroxysmal atrial flutter ([place])   . Palpitations   . PAC (premature atrial contraction)   . PVC's (premature ventricular contractions)   . Malaise   . Fatigue   . Myalgia     Cleston Lautner W. 12/21/2016, 9:54 PM  Frazier Butt., PT  [facility] [address] [place], [place], [address] Phone: [phone]   Fax:  [phone]  Name: [name] Weber MRN: [id number] Date of Birth: 1931/03/29

## 2016-12-22 ENCOUNTER — Ambulatory Visit: Payer: Medicare Other | Admitting: Physical Therapy

## 2016-12-22 DIAGNOSIS — R2689 Other abnormalities of gait and mobility: Secondary | ICD-10-CM

## 2016-12-22 DIAGNOSIS — R2681 Unsteadiness on feet: Secondary | ICD-10-CM | POA: Diagnosis not present

## 2016-12-22 DIAGNOSIS — M6281 Muscle weakness (generalized): Secondary | ICD-10-CM

## 2016-12-22 NOTE — Therapy (Signed)
[facility] [address] [place], [place], [address] Phone: [phone]   Fax:  [phone]  Physical Therapy Treatment  Patient Details  Name: [name] Weber MRN: [id number] Date of Birth: [birth year] Referring Provider: Naaman Plummer  Encounter Date: 12/22/2016      PT End of Session - 12/22/16 1512    Visit Number 18   Number of Visits 21  per recert 59/56/38   Date for PT Re-Evaluation 01/08/17   Authorization Type UHC Medicare-GCODE every 10th visit   PT Start Time 1235   PT Stop Time 1317   PT Time Calculation (min) 42 min   Equipment Utilized During Treatment Gait belt   Activity Tolerance Patient tolerated treatment well   Behavior During Therapy [facility] for tasks assessed/performed      Past Medical History:  Diagnosis Date  . Fatigue   . Malaise   . Myalgia   . PAC (premature atrial contraction)    ISOLATED  . Palpitations    OCCASSIONAL  . Paroxysmal atrial flutter ([place])   . PVC's (premature ventricular contractions)    ISOLATED    Past Surgical History:  Procedure Laterality Date  . THORACIC LAMINECTOMY FOR EPIDURAL ABSCESS N/A 11/16/2014   Procedure: THORACIC LAMINECTOMY FOR EPIDURAL ABSCESS;  Surgeon: Floyce Stakes, MD;  Location: [facility];  Service: Neurosurgery;  Laterality: N/A;  . VULVAR LESION REMOVAL  01/01/2009   She had a 1-cm area of erythema to the right of the urethral meatus    There were no vitals filed for this visit.      Subjective Assessment - 12/22/16 1238    Subjective States her back began hurting after she got home yesterday.  Reports 5/10 back pain at home after session and took some tylenol and it eased off after the tylenol.   Patient is accompained by: Family member  [name]   Patient Stated Goals Pt wants to do whatever she needs to do to make back stronger.   Currently in Pain? No/denies             [facility] Adult PT Treatment/Exercise - 12/21/16  1240      Exercises   Exercises Knee/Hip     Knee/Hip Exercises: Aerobic   Other Aerobic Scifit, Level 1.5, 6 minutes, 4 extremities for lower extremity strengthening     Knee/Hip Exercises: Standing   Forward Step Up Right;Left;1 set;20 reps;Hand Hold: 2   Functional Squat 1 set;10 reps  At parallel for UE support   Other Standing Knee Exercises Standing in parallel bars for UE support for marching, hip abduction, hip SLR, hip extension x 15 reps.  Taps to 4" step x 20 reps bil sides, taps to 6" step x 20 bil sides, standing on foam and taps to 6" step x 20 reps, SLS x 10 sec x 3 reps each side, stepping across forward<>backward ruler in floor with 1 UE support working on weight shifting and balance.       BP 135/63 HR 94 after PT session            PT Short Term Goals - 12/02/16 1700      PT SHORT TERM GOAL #1   Title Pt will be independent with updated/progressive HEP for improved strength, balance, and gait.  UPDATED TARGET 11/27/16   Baseline per recert 75/64/33   Time 4   Period Weeks   Status Achieved     PT SHORT TERM GOAL #2   Title Pt  will improve TUG score to less than or equal to 20 seconds for decreased fall risk.   Baseline 19.56 sec with RW on TUG 10/07/16   Time 4   Period Weeks   Status Achieved     PT SHORT TERM GOAL #3   Title Pt will improve Berg Balance score to at least 24/56 for decreased fall risk.   Time 4   Period Weeks   Status Not Met     PT SHORT TERM GOAL #4   Title Pt will perform at least 5 minutes of standing activities with intermittent UE support for improved participation in ADLs and household activities.   Time 4   Period Weeks   Status Achieved           PT Long Term Goals - 12/21/16 2149      PT LONG TERM GOAL #1   Title Pt will verbalize understanding of fall prevention within the home environemnt.  Updated TARGET 12/30/16 (as weeks remain in POC)   Time 6   Period Weeks   Status On-going     PT LONG TERM GOAL #2    Title Pt will improve TUG score to less than or equal to 17 seconds for decreased fall risk.  GOAL UPDATED TUG scores <or equal to 15 seconds for decreased fall risk.  TARGET 12/30/16   Baseline 16.87 seconds with Rollator on 11/10/16   Time 6   Period Weeks   Status Revised     PT LONG TERM GOAL #3   Title Pt will improve Berg balance score to at least 29/56 for decreased fall risk.  GOAL ONGOING-UPDATED TARGET 12/30/16   Time 6   Period Weeks   Status On-going     PT LONG TERM GOAL #4   Title Pt will improve gait velocity to at least 2.4 ft/sec for improved gait efficiency and safety with household and community gait.  GOAL UPDATED Gait velocity at least 2.62 ft/sec for improved community gait.  TARGET 12/30/16   Baseline 2.44 ft/sec with Rollator on 11/10/16   Time 6   Period Weeks   Status Revised     PT LONG TERM GOAL #5   Title Pt will verbalize understanding of transition to community fitness upon D/C from PT.  ONGOING GOAL UPDATED TARGET 12/30/16   Time 8   Period Weeks   Status On-going               Plan - 12/22/16 1305    Clinical Impression Statement Pt continues to need UE support with standing, balance and gait. Reports continues to perform HEP and looking into transition to [facility] after finishes therapy sessions.  Continue PT per POC.   Rehab Potential Good   PT Frequency 2x / week   PT Duration 6 weeks  per recert 92/11/94   PT Treatment/Interventions ADLs/Self Care Home Management;Functional mobility training;Gait training;DME Instruction;Therapeutic activities;Therapeutic exercise;Balance training;Neuromuscular re-education;Patient/family education;Orthotic Fit/Training   PT Next Visit Plan Continue to work on standing balance activities, step negotiation.  (This is week 6 of 6 in POC)  Work on dynamic standing balance activities-boxing bag, seated machines in prep for return to [employer].   Consulted and Agree with Plan of Care Patient    Family Member Consulted [name]      Patient will benefit from skilled therapeutic intervention in order to improve the following deficits and impairments:  Abnormal gait, Decreased balance, Decreased mobility, Decreased range of motion, Decreased safety awareness,  Decreased strength, Difficulty walking, Impaired flexibility, Postural dysfunction  Visit Diagnosis: Unsteadiness on feet  Muscle weakness (generalized)  Other abnormalities of gait and mobility     Problem List Patient Active Problem List   Diagnosis Date Noted  . Memory loss   . Lumbar burst fracture ([place]) 06/08/2016  . Lumbar compression fracture ([place]) 06/08/2016  . Bilateral foot-drop   . Midline thoracic back pain   . PAF (paroxysmal atrial fibrillation) ([place])   . Depression   . Constipation due to pain medication   . Chronic constipation   . Compression fracture of L2 ([place]) 05/30/2016  . Elevated blood pressure 05/30/2016  . L2 vertebral fracture ([place]) 05/30/2016  . Anxiety state 12/10/2014  . Neurogenic bladder 11/30/2014  . Neurogenic bowel 11/30/2014  . Bacterial UTI 11/30/2014  . Thoracic myelopathy 11/29/2014  . Persistent atrial fibrillation ([place]) 11/27/2014  . Traumatic spinal subdural hematoma   . Acute pulmonary embolism ([place])   . Orthostatic hypotension 11/22/2014  . Near syncope 11/21/2014  . Paraplegia at T9 level ([place]) 11/17/2014  . Weakness of both legs   . Nocturnal leg cramps 07/16/2014  . Encounter for therapeutic drug monitoring 12/25/2013  . Malaise and fatigue 06/01/2011  . Atrial flutter ([place]) 03/10/2011  . Long term (current) use of anticoagulants 03/10/2011  . Benign hypertensive heart disease without heart failure 03/10/2011  . Hypercholesterolemia 03/10/2011  . Paroxysmal atrial flutter ([place])   . Palpitations   . PAC (premature atrial contraction)   . PVC's (premature ventricular contractions)   . Malaise   . Fatigue   . [facility] [address] [place], [place], [address] Phone: [phone]   Fax:  [phone]  Name: [name] Weber MRN: [id number] Date of Birth: June 25, 1931   Narda Bonds, [place] 12/22/16 3:16 PM Phone: [phone] Fax: [phone]

## 2016-12-23 ENCOUNTER — Ambulatory Visit: Payer: Medicare Other | Admitting: Physical Therapy

## 2016-12-26 ENCOUNTER — Emergency Department (HOSPITAL_COMMUNITY): Payer: Medicare Other

## 2016-12-26 ENCOUNTER — Encounter (HOSPITAL_COMMUNITY): Payer: Self-pay | Admitting: Emergency Medicine

## 2016-12-26 ENCOUNTER — Inpatient Hospital Stay (HOSPITAL_COMMUNITY)
Admission: EM | Admit: 2016-12-26 | Discharge: 2016-12-30 | DRG: 536 | Disposition: A | Discharge status: Skilled Nursing Facility | Payer: Medicare Other | Attending: Internal Medicine | Admitting: Internal Medicine

## 2016-12-26 DIAGNOSIS — I119 Hypertensive heart disease without heart failure: Secondary | ICD-10-CM | POA: Diagnosis present

## 2016-12-26 DIAGNOSIS — M21371 Foot drop, right foot: Secondary | ICD-10-CM | POA: Diagnosis present

## 2016-12-26 DIAGNOSIS — K5901 Slow transit constipation: Secondary | ICD-10-CM

## 2016-12-26 DIAGNOSIS — R55 Syncope and collapse: Secondary | ICD-10-CM | POA: Diagnosis present

## 2016-12-26 DIAGNOSIS — M21372 Foot drop, left foot: Secondary | ICD-10-CM | POA: Diagnosis present

## 2016-12-26 DIAGNOSIS — F411 Generalized anxiety disorder: Secondary | ICD-10-CM

## 2016-12-26 DIAGNOSIS — Z87891 Personal history of nicotine dependence: Secondary | ICD-10-CM | POA: Diagnosis not present

## 2016-12-26 DIAGNOSIS — S32810A Multiple fractures of pelvis with stable disruption of pelvic ring, initial encounter for closed fracture: Secondary | ICD-10-CM | POA: Diagnosis present

## 2016-12-26 DIAGNOSIS — D696 Thrombocytopenia, unspecified: Secondary | ICD-10-CM | POA: Diagnosis present

## 2016-12-26 DIAGNOSIS — F329 Major depressive disorder, single episode, unspecified: Secondary | ICD-10-CM | POA: Diagnosis present

## 2016-12-26 DIAGNOSIS — E46 Unspecified protein-calorie malnutrition: Secondary | ICD-10-CM

## 2016-12-26 DIAGNOSIS — E8809 Other disorders of plasma-protein metabolism, not elsewhere classified: Secondary | ICD-10-CM

## 2016-12-26 DIAGNOSIS — I482 Chronic atrial fibrillation: Secondary | ICD-10-CM | POA: Diagnosis present

## 2016-12-26 DIAGNOSIS — Z79899 Other long term (current) drug therapy: Secondary | ICD-10-CM | POA: Diagnosis not present

## 2016-12-26 DIAGNOSIS — I951 Orthostatic hypotension: Secondary | ICD-10-CM | POA: Diagnosis present

## 2016-12-26 DIAGNOSIS — S329XXD Fracture of unspecified parts of lumbosacral spine and pelvis, subsequent encounter for fracture with routine healing: Secondary | ICD-10-CM | POA: Diagnosis not present

## 2016-12-26 DIAGNOSIS — I10 Essential (primary) hypertension: Secondary | ICD-10-CM

## 2016-12-26 DIAGNOSIS — J449 Chronic obstructive pulmonary disease, unspecified: Secondary | ICD-10-CM | POA: Diagnosis present

## 2016-12-26 DIAGNOSIS — S32810D Multiple fractures of pelvis with stable disruption of pelvic ring, subsequent encounter for fracture with routine healing: Secondary | ICD-10-CM | POA: Diagnosis not present

## 2016-12-26 DIAGNOSIS — E8889 Other specified metabolic disorders: Secondary | ICD-10-CM | POA: Diagnosis present

## 2016-12-26 DIAGNOSIS — Z7982 Long term (current) use of aspirin: Secondary | ICD-10-CM | POA: Diagnosis not present

## 2016-12-26 DIAGNOSIS — Z681 Body mass index (BMI) 19 or less, adult: Secondary | ICD-10-CM

## 2016-12-26 DIAGNOSIS — R531 Weakness: Secondary | ICD-10-CM | POA: Diagnosis present

## 2016-12-26 DIAGNOSIS — Z888 Allergy status to other drugs, medicaments and biological substances status: Secondary | ICD-10-CM

## 2016-12-26 DIAGNOSIS — I4891 Unspecified atrial fibrillation: Secondary | ICD-10-CM | POA: Diagnosis not present

## 2016-12-26 DIAGNOSIS — T148XXA Other injury of unspecified body region, initial encounter: Secondary | ICD-10-CM | POA: Diagnosis not present

## 2016-12-26 DIAGNOSIS — R52 Pain, unspecified: Secondary | ICD-10-CM

## 2016-12-26 DIAGNOSIS — W1830XA Fall on same level, unspecified, initial encounter: Secondary | ICD-10-CM | POA: Diagnosis present

## 2016-12-26 DIAGNOSIS — F419 Anxiety disorder, unspecified: Secondary | ICD-10-CM | POA: Diagnosis present

## 2016-12-26 DIAGNOSIS — S329XXA Fracture of unspecified parts of lumbosacral spine and pelvis, initial encounter for closed fracture: Secondary | ICD-10-CM | POA: Diagnosis present

## 2016-12-26 DIAGNOSIS — I272 Pulmonary hypertension, unspecified: Secondary | ICD-10-CM | POA: Diagnosis present

## 2016-12-26 DIAGNOSIS — E785 Hyperlipidemia, unspecified: Secondary | ICD-10-CM | POA: Diagnosis present

## 2016-12-26 DIAGNOSIS — G959 Disease of spinal cord, unspecified: Secondary | ICD-10-CM | POA: Diagnosis not present

## 2016-12-26 DIAGNOSIS — T07XXXA Unspecified multiple injuries, initial encounter: Secondary | ICD-10-CM

## 2016-12-26 DIAGNOSIS — M8000XA Age-related osteoporosis with current pathological fracture, unspecified site, initial encounter for fracture: Secondary | ICD-10-CM

## 2016-12-26 HISTORY — DX: Fracture of unspecified parts of lumbosacral spine and pelvis, initial encounter for closed fracture: S32.9XXA

## 2016-12-26 LAB — URINALYSIS, ROUTINE W REFLEX MICROSCOPIC
Bilirubin Urine: NEGATIVE
Glucose, UA: NEGATIVE mg/dL
Hgb urine dipstick: NEGATIVE
Ketones, ur: NEGATIVE mg/dL
Leukocytes, UA: NEGATIVE
Nitrite: NEGATIVE
Protein, ur: NEGATIVE mg/dL
Specific Gravity, Urine: 1.018 (ref 1.005–1.030)
pH: 7 (ref 5.0–8.0)

## 2016-12-26 LAB — BASIC METABOLIC PANEL
Anion gap: 12 (ref 5–15)
BUN: 21 mg/dL — ABNORMAL HIGH (ref 6–20)
CO2: 27 mmol/L (ref 22–32)
Calcium: 9.2 mg/dL (ref 8.9–10.3)
Chloride: 101 mmol/L (ref 101–111)
Creatinine, Ser: 0.57 mg/dL (ref 0.44–1.00)
GFR calc Af Amer: >60 mL/min (ref >60)
GFR calc non Af Amer: >60 mL/min (ref >60)
Glucose, Bld: 123 mg/dL — ABNORMAL HIGH (ref 65–99)
Potassium: 4.1 mmol/L (ref 3.5–5.1)
Sodium: 140 mmol/L (ref 135–145)

## 2016-12-26 LAB — CBC WITH DIFFERENTIAL/PLATELET
Basophils Absolute: 0 10*3/uL (ref 0.0–0.1)
Basophils Relative: 0 %
Eosinophils Absolute: 0 10*3/uL (ref 0.0–0.7)
Eosinophils Relative: 0 %
HCT: 41.7 % (ref 36.0–46.0)
Hemoglobin: 13.7 g/dL (ref 12.0–15.0)
Lymphocytes Relative: 8 %
Lymphs Abs: 0.9 10*3/uL (ref 0.7–4.0)
MCH: 32.3 pg (ref 26.0–34.0)
MCHC: 32.9 g/dL (ref 30.0–36.0)
MCV: 98.3 fL (ref 78.0–100.0)
Monocytes Absolute: 0.8 10*3/uL (ref 0.1–1.0)
Monocytes Relative: 7 %
Neutro Abs: 9.7 10*3/uL — ABNORMAL HIGH (ref 1.7–7.7)
Neutrophils Relative %: 85 %
Platelets: 157 10*3/uL (ref 150–400)
RBC: 4.24 MIL/uL (ref 3.87–5.11)
RDW: 12.2 % (ref 11.5–15.5)
WBC: 11.4 10*3/uL — ABNORMAL HIGH (ref 4.0–10.5)

## 2016-12-26 LAB — TYPE AND SCREEN
ABO/RH(D): A POS
Antibody Screen: NEGATIVE

## 2016-12-26 LAB — I-STAT CHEM 8, ED
BUN: 25 mg/dL — ABNORMAL HIGH (ref 6–20)
Calcium, Ion: 1.13 mmol/L — ABNORMAL LOW (ref 1.15–1.40)
Chloride: 101 mmol/L (ref 101–111)
Creatinine, Ser: 0.5 mg/dL (ref 0.44–1.00)
Glucose, Bld: 124 mg/dL — ABNORMAL HIGH (ref 65–99)
HCT: 43 % (ref 36.0–46.0)
Hemoglobin: 14.6 g/dL (ref 12.0–15.0)
Potassium: 4.1 mmol/L (ref 3.5–5.1)
Sodium: 141 mmol/L (ref 135–145)
TCO2: 32 mmol/L (ref 0–100)

## 2016-12-26 LAB — PROTIME-INR
INR: 1.05
Prothrombin Time: 13.8 seconds (ref 11.4–15.2)

## 2016-12-26 LAB — DIGOXIN LEVEL: Digoxin Level: 0.6 ng/mL — ABNORMAL LOW (ref 0.8–2.0)

## 2016-12-26 MED ORDER — ACETAMINOPHEN 325 MG PO TABS
650.0000 mg | ORAL_TABLET | Freq: Four times a day (QID) | ORAL | Status: DC | PRN
  Filled 2016-12-26: qty 2

## 2016-12-26 MED ORDER — POLYETHYLENE GLYCOL 3350 17 G PO PACK
17.0000 g | PACK | Freq: Every day | ORAL | Status: DC | PRN
  Administered 2016-12-29: 17 g via ORAL
  Filled 2016-12-26: qty 1

## 2016-12-26 MED ORDER — DIGOXIN 125 MCG PO TABS
125.0000 ug | ORAL_TABLET | Freq: Every day | ORAL | Status: DC
  Administered 2016-12-26 – 2016-12-30 (×5): 125 ug via ORAL
  Filled 2016-12-26 (×4): qty 1

## 2016-12-26 MED ORDER — MORPHINE SULFATE (PF) 4 MG/ML IV SOLN
2.0000 mg | Freq: Once | INTRAVENOUS | Status: AC
  Administered 2016-12-26: 2 mg via INTRAVENOUS
  Filled 2016-12-26: qty 1

## 2016-12-26 MED ORDER — MORPHINE SULFATE (PF) 4 MG/ML IV SOLN: 2.0000 mg | INTRAVENOUS | Status: DC | PRN

## 2016-12-26 MED ORDER — TRAZODONE HCL 50 MG PO TABS: 25.0000 mg | ORAL_TABLET | Freq: Every evening | ORAL | Status: DC | PRN

## 2016-12-26 MED ORDER — ASPIRIN EC 81 MG PO TBEC
81.0000 mg | DELAYED_RELEASE_TABLET | Freq: Every day | ORAL | Status: DC
  Administered 2016-12-26 – 2016-12-30 (×5): 81 mg via ORAL
  Filled 2016-12-26 (×4): qty 1

## 2016-12-26 MED ORDER — ONDANSETRON HCL 4 MG/2ML IJ SOLN
4.0000 mg | Freq: Four times a day (QID) | INTRAMUSCULAR | Status: DC | PRN
  Administered 2016-12-28: 4 mg via INTRAVENOUS
  Filled 2016-12-26: qty 2

## 2016-12-26 MED ORDER — OMEGA-3-ACID ETHYL ESTERS 1 G PO CAPS
1.0000 g | ORAL_CAPSULE | Freq: Every day | ORAL | Status: DC
  Administered 2016-12-26 – 2016-12-30 (×5): 1 g via ORAL
  Filled 2016-12-26 (×4): qty 1

## 2016-12-26 MED ORDER — HYDROCODONE-ACETAMINOPHEN 5-325 MG PO TABS
1.0000 | ORAL_TABLET | ORAL | Status: DC | PRN
Start: 2016-12-26 — End: 2016-12-30
  Administered 2016-12-26 – 2016-12-28 (×4): 2 via ORAL
  Administered 2016-12-28 – 2016-12-29 (×4): 1 via ORAL
  Administered 2016-12-30: 2 via ORAL
  Administered 2016-12-30: 1 via ORAL
  Filled 2016-12-26 (×2): qty 2
  Filled 2016-12-26: qty 1
  Filled 2016-12-26 (×2): qty 2
  Filled 2016-12-26 (×2): qty 1
  Filled 2016-12-26: qty 2
  Filled 2016-12-26: qty 1
  Filled 2016-12-26: qty 2
  Filled 2016-12-26: qty 1

## 2016-12-26 MED ORDER — ONDANSETRON HCL 4 MG PO TABS: 4.0000 mg | ORAL_TABLET | Freq: Four times a day (QID) | ORAL | Status: DC | PRN

## 2016-12-26 MED ORDER — ONDANSETRON HCL 4 MG/2ML IJ SOLN
4.0000 mg | Freq: Once | INTRAMUSCULAR | Status: AC
  Administered 2016-12-26: 4 mg via INTRAVENOUS
  Filled 2016-12-26: qty 2

## 2016-12-26 MED ORDER — ACETAMINOPHEN 500 MG PO TABS
1000.0000 mg | ORAL_TABLET | Freq: Once | ORAL | Status: DC
  Filled 2016-12-26 (×2): qty 2

## 2016-12-26 MED ORDER — LATANOPROST 0.005 % OP SOLN
1.0000 [drp] | Freq: Every day | OPHTHALMIC | Status: DC
  Administered 2016-12-26 – 2016-12-29 (×4): 1 [drp] via OPHTHALMIC
  Filled 2016-12-26: qty 2.5

## 2016-12-26 MED ORDER — HEPARIN SODIUM (PORCINE) 5000 UNIT/ML IJ SOLN
5000.0000 [IU] | Freq: Three times a day (TID) | INTRAMUSCULAR | Status: DC
  Administered 2016-12-29 – 2016-12-30 (×2): 5000 [IU] via SUBCUTANEOUS
  Filled 2016-12-26 (×4): qty 1

## 2016-12-26 MED ORDER — ACETAMINOPHEN 650 MG RE SUPP: 650.0000 mg | Freq: Four times a day (QID) | RECTAL | Status: DC | PRN

## 2016-12-26 NOTE — Consult Note (Signed)
Reason for Consult: multiple pelvic fx's Referring Physician: Dr. Tyrone Nine (EDP)  [name] Weber is an 83 y.o. female.  HPI: Pt experienced a mechanical fall, brought to hospital c/o hip/low back pain. Found to have multiple pelvic fx's on studies. Admitted to medicine service, ortho consulted for assistance.  Pt ambulates with a walker at home, lives alone with her dog, has family help her with grocery shopping and other necessities.  Past Medical History:  Diagnosis Date  . Fatigue   . Malaise   . Myalgia   . PAC (premature atrial contraction)    ISOLATED  . Palpitations    OCCASSIONAL  . Paroxysmal atrial flutter ([place])   . PVC's (premature ventricular contractions)    ISOLATED    Past Surgical History:  Procedure Laterality Date  . THORACIC LAMINECTOMY FOR EPIDURAL ABSCESS N/A 11/16/2014   Procedure: THORACIC LAMINECTOMY FOR EPIDURAL ABSCESS;  Surgeon: Floyce Stakes, MD;  Location: [facility];  Service: Neurosurgery;  Laterality: N/A;  . VULVAR LESION REMOVAL  01/01/2009   She had a 1-cm area of erythema to the right of the urethral meatus    Family History  Problem Relation Age of Onset  . Hyperlipidemia Mother     Social History:  reports that she quit smoking about 25 years ago. She has quit using smokeless tobacco. She reports that she does not drink alcohol or use drugs.  Allergies:  Allergies  Allergen Reactions  . Crestor [Rosuvastatin Calcium] Other (See Comments)    "bones ache all over"  . Rosuvastatin Other (See Comments)    "bones ache all over" Bone pain all over body    Medications: I have reviewed the patient's current medications.  Results for orders placed or performed during the hospital encounter of 12/26/16 (from the past 48 hour(s))  Type and screen [place]     Status: None   Collection Time: 12/26/16  8:08 AM  Result Value Ref Range   ABO/RH(D) A POS    Antibody Screen NEG    Sample Expiration 97/12/6376   Basic  metabolic panel     Status: Abnormal   Collection Time: 12/26/16  8:10 AM  Result Value Ref Range   Sodium 140 135 - 145 mmol/L   Potassium 4.1 3.5 - 5.1 mmol/L   Chloride 101 101 - 111 mmol/L   CO2 27 22 - 32 mmol/L   Glucose, Bld 123 (H) 65 - 99 mg/dL   BUN 21 (H) 6 - 20 mg/dL   Creatinine, Ser 0.57 0.44 - 1.00 mg/dL   Calcium 9.2 8.9 - 10.3 mg/dL   GFR calc non Af Amer >60 >60 mL/min   GFR calc Af Amer >60 >60 mL/min    Comment: (NOTE) The eGFR has been calculated using the CKD EPI equation. This calculation has not been validated in all clinical situations. eGFR's persistently <60 mL/min signify possible Chronic Kidney Disease.    Anion gap 12 5 - 15  CBC WITH DIFFERENTIAL     Status: Abnormal   Collection Time: 12/26/16  8:10 AM  Result Value Ref Range   WBC 11.4 (H) 4.0 - 10.5 K/uL   RBC 4.24 3.87 - 5.11 MIL/uL   Hemoglobin 13.7 12.0 - 15.0 g/dL   HCT 41.7 36.0 - 46.0 %   MCV 98.3 78.0 - 100.0 fL   MCH 32.3 26.0 - 34.0 pg   MCHC 32.9 30.0 - 36.0 g/dL   RDW 12.2 11.5 - 15.5 %   Platelets  157 150 - 400 K/uL   Neutrophils Relative % 85 %   Neutro Abs 9.7 (H) 1.7 - 7.7 K/uL   Lymphocytes Relative 8 %   Lymphs Abs 0.9 0.7 - 4.0 K/uL   Monocytes Relative 7 %   Monocytes Absolute 0.8 0.1 - 1.0 K/uL   Eosinophils Relative 0 %   Eosinophils Absolute 0.0 0.0 - 0.7 K/uL   Basophils Relative 0 %   Basophils Absolute 0.0 0.0 - 0.1 K/uL  Protime-INR     Status: None   Collection Time: 12/26/16  8:10 AM  Result Value Ref Range   Prothrombin Time 13.8 11.4 - 15.2 seconds   INR 1.05   I-Stat Chem 8, ED  (not at [facility])     Status: Abnormal   Collection Time: 12/26/16  9:18 AM  Result Value Ref Range   Sodium 141 135 - 145 mmol/L   Potassium 4.1 3.5 - 5.1 mmol/L   Chloride 101 101 - 111 mmol/L   BUN 25 (H) 6 - 20 mg/dL   Creatinine, Ser 0.50 0.44 - 1.00 mg/dL   Glucose, Bld 124 (H) 65 - 99 mg/dL   Calcium, Ion 1.13 (L) 1.15 - 1.40 mmol/L   TCO2 32 0 - 100 mmol/L    Hemoglobin 14.6 12.0 - 15.0 g/dL   HCT 43.0 36.0 - 46.0 %  Urinalysis, Routine w reflex microscopic     Status: None   Collection Time: 12/26/16 12:04 PM  Result Value Ref Range   Color, Urine YELLOW YELLOW   APPearance CLEAR CLEAR   Specific Gravity, Urine 1.018 1.005 - 1.030   pH 7.0 5.0 - 8.0   Glucose, UA NEGATIVE NEGATIVE mg/dL   Hgb urine dipstick NEGATIVE NEGATIVE   Bilirubin Urine NEGATIVE NEGATIVE   Ketones, ur NEGATIVE NEGATIVE mg/dL   Protein, ur NEGATIVE NEGATIVE mg/dL   Nitrite NEGATIVE NEGATIVE   Leukocytes, UA NEGATIVE NEGATIVE    Dg Chest 1 View  Result Date: 12/26/2016 CLINICAL DATA:  Fall EXAM: CHEST 1 VIEW COMPARISON:  11/13/2014 FINDINGS: Mild cardiomegaly. There is hyperinflation of the lungs compatible with COPD. No confluent opacities or effusions. No acute bony abnormality. IMPRESSION: COPD.  Cardiomegaly.  No active disease. Electronically Signed   By: Rolm Baptise M.D.   On: 12/26/2016 09:01   Ct Pelvis Wo Contrast  Result Date: 12/26/2016 CLINICAL DATA:  Status post fall. EXAM: CT PELVIS WITHOUT CONTRAST TECHNIQUE: Multidetector CT imaging of the pelvis was performed following the standard protocol without intravenous contrast. COMPARISON:  None. FINDINGS: Urinary Tract: No renal abnormality visualized. The urinary bladder is significantly distended. Bowel: Diffuse left colonic diverticulosis without evidence of diverticulitis on this nonenhanced exam. Vascular/Lymphatic: No pathologically enlarged lymph nodes. Advanced calcific atherosclerotic disease of the aorta and its branches. IVC filter is in place. Reproductive:  Post hysterectomy. Other:  None. Musculoskeletal: There is a comminuted impacted fracture of the right inferior pubic ramus with buckle 0.8 similarly comminuted fracture of the superior pubic ramus on the right. There is a minimally displaced fracture of the right ischium and ilium extending to the SI joint. There is a questionable tiny avulsion  fracture of the right anterior sacrum at the anterior portion of the right sacroiliac joint. There is generalized advanced osteopenia. No other fractures are seen. There are severe osteoarthritic changes of the visualized portion of the lumbosacral spine with likely degenerative grade 1 anterolisthesis of L5 on S1. IMPRESSION: Significant distension of the urinary bladder. Please correlate clinically. Advanced calcific atherosclerotic  disease of the aorta. Advanced osteopenia. Right-sided pelvic fractures to include comminuted impacted fractures of the right inferior and superior pubic rami, minimally displaced fractures of the right ischium and ileum extending to the SI joint, and probable avulsion fractures of the right sacrum at the inferior SI joint. Severe osteoarthritic changes of the visualized portion of the lumbosacral spine with grade 1 anterolisthesis of L5 on S1. Electronically Signed   By: Fidela Salisbury M.D.   On: 12/26/2016 10:41   Dg Hip Unilat With Pelvis 2-3 Views Right  Result Date: 12/26/2016 CLINICAL DATA:  Right groin pain. EXAM: DG HIP (WITH OR WITHOUT PELVIS) 2-3V RIGHT COMPARISON:  Lumbar spine 08/10/2016 FINDINGS: New displaced fractures involving the right inferior and superior pubic rami. Possible fractures involving the right side of the sacrum demonstrated by disruption of the sacral arcuate lines. Atherosclerotic calcifications in the iliac arteries. The right hip is located without a fracture. IMPRESSION: Displaced right pubic rami fractures.  Possible sacral fractures. Electronically Signed   By: Markus Daft M.D.   On: 12/26/2016 09:02    Review of Systems  Constitutional: Negative.   HENT: Negative.   Eyes: Negative.   Respiratory: Negative.   Cardiovascular: Negative.   Gastrointestinal: Negative.   Genitourinary: Negative.   Musculoskeletal: Positive for back pain and joint pain.  Skin: Negative.   Neurological: Negative.    Blood pressure (!) 145/76, pulse  84, temperature 97.8 F (36.6 C), temperature source Oral, resp. rate 18, height _0  (1.676 m), weight 49.4 kg (109 lb), SpO2 94 %. Physical Exam  Constitutional: She is oriented to person, place, and time. She appears well-developed.  HENT:  Head: Normocephalic.  Eyes: Pupils are equal, round, and reactive to light.  Neck: Normal range of motion.  Respiratory: Effort normal.  GI: Soft.  Musculoskeletal:  Comfortable, lying in bed No calf pain or sign of DVT No leg length discrepancy noted Sensation intact distally Pain with log rolling of R hip   Neurological: She is alert and oriented to person, place, and time.  Skin: Skin is warm and dry.      xrays and CT reviewed by Dr. Lyla Glassing. R inferior and superior pubic rami fx, crescent fx and Denis I sacrum fx, extensive DDD lumbar spine.  Assessment/Plan: R LC2 pelvis fx  Admit to medicine Pain control DVT ppx Dr. Lyla Glassing discussed with Ainsley Spinner, Dr. Carlean Jews PA, who will eval on Monday and discuss with Dr. Marcelino Scot then. Will likely require surgical fixation which would be per Dr. Marcelino Scot. Discussed plan with family and they are in agreement Pt seen by myself and Dr. Nash Shearer, [facility] Rolls. 12/26/2016, 2:52 PM

## 2016-12-26 NOTE — ED Provider Notes (Signed)
[facility] Provider Note   CSN: [id number] Arrival date & time: 12/26/16  4782     History   Chief Complaint Chief Complaint  Patient presents with  . Fall    HPI [name] Weber is a 83 y.o. female.  83 yo F with a chief complaint of a fall. Patient is unsure exactly how she fell. Complaining of pain to the right inguinal region. Unable to get up and walk after the incident. Denies head injury denies chest pain denies shortness breath denies abdominal pain denies back pain. Patient denies illness prior to the fall. Pain with internal and external rotation of the right leg.   The history is provided by the patient and a relative.  Illness  Pertinent negatives include no chest pain, no headaches and no shortness of breath.    Past Medical History:  Diagnosis Date  . Fatigue   . Malaise   . Myalgia   . PAC (premature atrial contraction)    ISOLATED  . Palpitations    OCCASSIONAL  . Paroxysmal atrial flutter ([facility])   . PVC's (premature ventricular contractions)    ISOLATED    Patient Active Problem List   Diagnosis Date Noted  . Pelvic fracture ([facility]CC) 12/26/2016  . Multiple closed fractures of pelvis with stable disruption of pelvic circle (HCC)   . Memory loss   . Lumbar burst fracture ([facility]CC) 06/08/2016  . Lumbar compression fracture ([facility]) 06/08/2016  . Bilateral foot-drop   . Midline thoracic back pain   . A-fib (HCC)   . Depression   . Constipation due to pain medication   . Chronic constipation   . Compression fracture of L2 (HCC) 05/30/2016  . Elevated blood pressure 05/30/2016  . L2 vertebral fracture (HCC) 05/30/2016  . Anxiety state 12/10/2014  . Neurogenic bladder 11/30/2014  . Neurogenic bowel 11/30/2014  . Bacterial UTI 11/30/2014  . Thoracic myelopathy 11/29/2014  . Persistent atrial fibrillation (HCC) 11/27/2014  . Traumatic spinal subdural hematoma   . Acute pulmonary embolism (HCC)   . Orthostatic hypotension 11/22/2014  . Near  syncope 11/21/2014  . Paraplegia at T9 level (HCC) 11/17/2014  . Weakness of both legs   . Nocturnal leg cramps 07/16/2014  . Encounter for therapeutic drug monitoring 12/25/2013  . Malaise and fatigue 06/01/2011  . Atrial flutter (HCC) 03/10/2011  . Long term (current) use of anticoagulants 03/10/2011  . Benign hypertensive heart disease without heart failure 03/10/2011  . HLD (hyperlipidemia) 03/10/2011  . Paroxysmal atrial flutter (HCC)   . Palpitations   . PAC (premature atrial contraction)   . PVC's (premature ventricular contractions)   . Malaise   . Fatigue   . Myalgia     Past Surgical History:  Procedure Laterality Date  . THORACIC LAMINECTOMY FOR EPIDURAL ABSCESS N/A 11/16/2014   Procedure: THORACIC LAMINECTOMY FOR EPIDURAL ABSCESS;  Surgeon: Karn Cassis, MD;  Location: [facility];  Service: Neurosurgery;  Laterality: N/A;  . VULVAR LESION REMOVAL  01/01/2009   She had a 1-cm area of erythema to the right of the urethral meatus    OB History    No data available       Home Medications    Prior to Admission medications   Medication Sig Start Date End Date Taking? Authorizing Provider  aspirin EC 81 MG EC tablet Take 1 tablet (81 mg total) by mouth daily. 12/17/14  Yes Evlyn Kanner Love, PA-C  Calcium Carb-Cholecalciferol (CALCIUM 600 + D PO) Take 1 tablet by  mouth daily.    Yes Historical Provider, MD  digoxin (LANOXIN) 0.125 MG tablet TAKE 1 TABLET BY MOUTH DAILY 03/09/16  Yes Pricilla RifflePaula Ross V, MD  latanoprost (XALATAN) 0.005 % ophthalmic solution Place 1 drop into both eyes at bedtime. 12/22/16  Yes Historical Provider, MD  Magnesium 400 MG CAPS Take 400 mg by mouth daily.    Yes Historical Provider, MD  Omega-3 Fatty Acids (FISH OIL ULTRA) 1400 MG CAPS Take 1,400 mg by mouth daily.    Yes Historical Provider, MD  Turmeric 500 MG CAPS Take 500 mg by mouth daily.    Yes Historical Provider, MD    Family History Family History  Problem Relation Age of Onset  .  Hyperlipidemia Mother     Social History Social History  Substance Use Topics  . Smoking status: Former Smoker    Quit date: 03/09/1991  . Smokeless tobacco: Former NeurosurgeonUser  . Alcohol use No     Allergies   Crestor [rosuvastatin calcium] and Rosuvastatin   Review of Systems Review of Systems  Constitutional: Negative for chills and fever.  HENT: Negative for congestion and rhinorrhea.   Eyes: Negative for redness and visual disturbance.  Respiratory: Negative for shortness of breath and wheezing.   Cardiovascular: Negative for chest pain and palpitations.  Gastrointestinal: Negative for nausea and vomiting.  Genitourinary: Negative for dysuria and urgency.  Musculoskeletal: Positive for arthralgias, gait problem and myalgias.  Skin: Negative for pallor and wound.  Neurological: Negative for dizziness and headaches.     Physical Exam Updated Vital Signs BP (!) 145/76 (BP Location: Right Arm)   Pulse 84   Temp 97.8 F (36.6 C) (Oral)   Resp 18   Ht 5\' 6"  (1.676 m)   Wt 109 lb (49.4 kg)   SpO2 94%   BMI 17.59 kg/m   Physical Exam  Constitutional: She is oriented to person, place, and time. She appears well-developed and well-nourished. No distress.  HENT:  Head: Normocephalic and atraumatic.  Eyes: EOM are normal. Pupils are equal, round, and reactive to light.  Neck: Normal range of motion. Neck supple.  Cardiovascular: Normal rate and regular rhythm.  Exam reveals no gallop and no friction rub.   No murmur heard. Pulmonary/Chest: Effort normal. She has no wheezes. She has no rales.  Abdominal: Soft. She exhibits no distension. There is no tenderness.  Musculoskeletal: She exhibits tenderness (tenderness about the right inguinal region. Mild tenderness with internal and external rotation of the right lower extremity. Pulse motor and sensation is intact.). She exhibits no edema.  Neurological: She is alert and oriented to person, place, and time.  Skin: Skin is warm  and dry. She is not diaphoretic.  Psychiatric: She has a normal mood and affect. Her behavior is normal.  Nursing note and vitals reviewed.    ED Treatments / Results  Labs (all labs ordered are listed, but only abnormal results are displayed) Labs Reviewed  BASIC METABOLIC PANEL - Abnormal; Notable for the following:       Result Value   Glucose, Bld 123 (*)    BUN 21 (*)    All other components within normal limits  CBC WITH DIFFERENTIAL/PLATELET - Abnormal; Notable for the following:    WBC 11.4 (*)    Neutro Abs 9.7 (*)    All other components within normal limits  I-STAT CHEM 8, ED - Abnormal; Notable for the following:    BUN 25 (*)    Glucose, Bld 124 (*)  Calcium, Ion 1.13 (*)    All other components within normal limits  PROTIME-INR  URINALYSIS, ROUTINE W REFLEX MICROSCOPIC  DIGOXIN LEVEL  TYPE AND SCREEN    EKG  EKG Interpretation  Date/Time:  Saturday December 26 2016 08:50:44 EST Ventricular Rate:  82 PR Interval:    QRS Duration: 88 QT Interval:  364 QTC Calculation: 426 R Axis:   -81 Text Interpretation:  Atrial fibrillation Left anterior fascicular block Anterior infarct, old Nonspecific T abnormalities, lateral leads Since last tracing rate slower Otherwise no significant change Confirmed by Adela Lank MD, DANIEL [phone]) on 12/26/2016 9:06:31 AM       Radiology Dg Chest 1 View  Result Date: 12/26/2016 CLINICAL DATA:  Fall EXAM: CHEST 1 VIEW COMPARISON:  11/13/2014 FINDINGS: Mild cardiomegaly. There is hyperinflation of the lungs compatible with COPD. No confluent opacities or effusions. No acute bony abnormality. IMPRESSION: COPD.  Cardiomegaly.  No active disease. Electronically Signed   By: Charlett Nose M.D.   On: 12/26/2016 09:01   Ct Pelvis Wo Contrast  Result Date: 12/26/2016 CLINICAL DATA:  Status post fall. EXAM: CT PELVIS WITHOUT CONTRAST TECHNIQUE: Multidetector CT imaging of the pelvis was performed following the standard protocol without  intravenous contrast. COMPARISON:  None. FINDINGS: Urinary Tract: No renal abnormality visualized. The urinary bladder is significantly distended. Bowel: Diffuse left colonic diverticulosis without evidence of diverticulitis on this nonenhanced exam. Vascular/Lymphatic: No pathologically enlarged lymph nodes. Advanced calcific atherosclerotic disease of the aorta and its branches. IVC filter is in place. Reproductive:  Post hysterectomy. Other:  None. Musculoskeletal: There is a comminuted impacted fracture of the right inferior pubic ramus with buckle 0.8 similarly comminuted fracture of the superior pubic ramus on the right. There is a minimally displaced fracture of the right ischium and ilium extending to the SI joint. There is a questionable tiny avulsion fracture of the right anterior sacrum at the anterior portion of the right sacroiliac joint. There is generalized advanced osteopenia. No other fractures are seen. There are severe osteoarthritic changes of the visualized portion of the lumbosacral spine with likely degenerative grade 1 anterolisthesis of L5 on S1. IMPRESSION: Significant distension of the urinary bladder. Please correlate clinically. Advanced calcific atherosclerotic disease of the aorta. Advanced osteopenia. Right-sided pelvic fractures to include comminuted impacted fractures of the right inferior and superior pubic rami, minimally displaced fractures of the right ischium and ileum extending to the SI joint, and probable avulsion fractures of the right sacrum at the inferior SI joint. Severe osteoarthritic changes of the visualized portion of the lumbosacral spine with grade 1 anterolisthesis of L5 on S1. Electronically Signed   By: Ted Mcalpine M.D.   On: 12/26/2016 10:41   Dg Hip Unilat With Pelvis 2-3 Views Right  Result Date: 12/26/2016 CLINICAL DATA:  Right groin pain. EXAM: DG HIP (WITH OR WITHOUT PELVIS) 2-3V RIGHT COMPARISON:  Lumbar spine 08/10/2016 FINDINGS: New  displaced fractures involving the right inferior and superior pubic rami. Possible fractures involving the right side of the sacrum demonstrated by disruption of the sacral arcuate lines. Atherosclerotic calcifications in the iliac arteries. The right hip is located without a fracture. IMPRESSION: Displaced right pubic rami fractures.  Possible sacral fractures. Electronically Signed   By: Richarda Overlie M.D.   On: 12/26/2016 09:02    Procedures Procedures (including critical care time)  Medications Ordered in ED Medications  acetaminophen (TYLENOL) tablet 1,000 mg (1,000 mg Oral Not Given 12/26/16 0924)  latanoprost (XALATAN) 0.005 % ophthalmic solution 1 drop (  not administered)  omega-3 acid ethyl esters (LOVAZA) capsule 1 g (not administered)  digoxin (LANOXIN) tablet 125 mcg (not administered)  aspirin EC tablet 81 mg (not administered)  heparin injection 5,000 Units (not administered)  acetaminophen (TYLENOL) tablet 650 mg (not administered)    Or  acetaminophen (TYLENOL) suppository 650 mg (not administered)  HYDROcodone-acetaminophen (NORCO/VICODIN) 5-325 MG per tablet 1-2 tablet (2 tablets Oral Given 12/26/16 1341)  polyethylene glycol (MIRALAX / GLYCOLAX) packet 17 g (not administered)  ondansetron (ZOFRAN) tablet 4 mg (not administered)    Or  ondansetron (ZOFRAN) injection 4 mg (not administered)  traZODone (DESYREL) tablet 25 mg (not administered)  morphine 4 MG/ML injection 2 mg (not administered)  morphine 4 MG/ML injection 2 mg (2 mg Intravenous Given 12/26/16 0922)  ondansetron (ZOFRAN) injection 4 mg (4 mg Intravenous Given 12/26/16 0921)     Initial Impression / Assessment and Plan / ED Course  I have reviewed the triage vital signs and the nursing notes.  Pertinent labs & imaging results that were available during my care of the patient were reviewed by me and considered in my medical decision making (see chart for details).     83 yo F With a chief complaint of a fall.  Patient has pain to the right inguinal region. X-ray is consistent with a pelvic fracture with some significant displacement. Will obtain a CT to evaluate for other injury.   The patient lives alone in cares for herself. Currently unable to get around with her injury. We'll discuss with hospitalist for admission.   Discussed with [place], ortho, agreed with admission with discuss with Dr. Carola Frost about further care.   The patients results and plan were reviewed and discussed.   Any x-rays performed were independently reviewed by myself.   Differential diagnosis were considered with the presenting HPI.  Medications  acetaminophen (TYLENOL) tablet 1,000 mg (1,000 mg Oral Not Given 12/26/16 0924)  latanoprost (XALATAN) 0.005 % ophthalmic solution 1 drop (not administered)  omega-3 acid ethyl esters (LOVAZA) capsule 1 g (not administered)  digoxin (LANOXIN) tablet 125 mcg (not administered)  aspirin EC tablet 81 mg (not administered)  heparin injection 5,000 Units (not administered)  acetaminophen (TYLENOL) tablet 650 mg (not administered)    Or  acetaminophen (TYLENOL) suppository 650 mg (not administered)  HYDROcodone-acetaminophen (NORCO/VICODIN) 5-325 MG per tablet 1-2 tablet (2 tablets Oral Given 12/26/16 1341)  polyethylene glycol (MIRALAX / GLYCOLAX) packet 17 g (not administered)  ondansetron (ZOFRAN) tablet 4 mg (not administered)    Or  ondansetron (ZOFRAN) injection 4 mg (not administered)  traZODone (DESYREL) tablet 25 mg (not administered)  morphine 4 MG/ML injection 2 mg (not administered)  morphine 4 MG/ML injection 2 mg (2 mg Intravenous Given 12/26/16 0922)  ondansetron (ZOFRAN) injection 4 mg (4 mg Intravenous Given 12/26/16 0921)    Vitals:   12/26/16 1030 12/26/16 1100 12/26/16 1130 12/26/16 1338  BP: 114/60 118/70 (!) 110/52 (!) 145/76  Pulse:   83 84  Resp: 18 24 20 18   Temp:      TempSrc:      SpO2: 95% 96% 95% 94%  Weight:      Height:        Final  diagnoses:  Multiple closed fractures of pelvis with stable disruption of pelvic ring, initial encounter (HCC)    Admission/ observation were discussed with the admitting physician, patient and/or family and they are comfortable with the plan.   Final Clinical Impressions(s) / ED Diagnoses   Final diagnoses:  Multiple closed fractures of pelvis with stable disruption of pelvic ring, initial encounter [facility])    New Prescriptions Current Discharge Medication List       Melene Plan, DO 12/26/16 1435

## 2016-12-26 NOTE — ED Triage Notes (Signed)
Pt to ER by [facility] from home for evaluation of right hip pain after spontaneously falling this morning around 3 am, patient denies tripping, dizziness, and LOC. States "I just fell." Pt remained on the floor until she was able to crawl to the telephone at 7am. External rotation noted to right foot, pulses present in bilateral lower extremities. Vitals are stable. Received 100 mcg of fentanyl in route and 4 mg of zofran. Denies pain at current time without movement. Hx of atrial fibrillation, not on any anticoagulation. Pt is alert and oriented x4.

## 2016-12-26 NOTE — H&P (Signed)
History and Physical    [name] Weber ZOX:[id number] DOB: 01/15/1931 DOA: 12/26/2016  PCP: Pearla DubonnetGATES,[name], MD Patient coming from: home   Chief Complaint: Fall with severe pelvic and right groin pain  HPI: [name] Weber is a 83 y.o. female with medical history significant of Afib, with rate controlled on digoxin, previous epidural hemorrhage on anticoagulation therapy that was discontinued and patient is now on aspirin for primary CVA prophylaxis who presented to the emergency room after she fell this morning in the bathroom. Patient reported that in early morning hours she went to the bathroom using her walker and was to use a walker to return back to the bedroom, but all of a sudden found herself lying on the floor near, round. Doesn't have any recollection of how she fell the last thing she remembers is her intention to use a walker to go back. She doesn't remember how long she might of been  unconscious and is unsure whether she passed out at all. She denied any previous episodes like this. Patient managed to slide back to the bedroom and around 6:30 in the morning she called her daughter.  Patient denied chest pain, shortness of breath, dizziness, palpitations, lightheadedness.  ED Course: In the emergency department vital signs were stable, blood work revealed mildly elevated white blood cells count of 11,400,. UA was negative, chest x-ray showed cardiomegaly, COPD, otherwise no active cardiopulmonary disease. Abdominal and pelvic CT showed right-sided pelvic fractures including comminuted impacted fractures of the right inferior and superior pubic Ramey minimally displaced doctors of the right ischium and ileum extending to the SI joint, probable avulsion fractures of the right sacrum and inferior SI joint per Dr.    Review of Systems: As per HPI otherwise 10 point review of systems negative.   Ambulatory Status: Uses walker for ambulation  Past Medical History:  Diagnosis  Date  . Fatigue   . Malaise   . Myalgia   . PAC (premature atrial contraction)    ISOLATED  . Palpitations    OCCASSIONAL  . Paroxysmal atrial flutter (HCC)   . PVC's (premature ventricular contractions)    ISOLATED    Past Surgical History:  Procedure Laterality Date  . THORACIC LAMINECTOMY FOR EPIDURAL ABSCESS N/A 11/16/2014   Procedure: THORACIC LAMINECTOMY FOR EPIDURAL ABSCESS;  Surgeon: Karn CassisErnesto M Botero, MD;  Location: [facility];  Service: Neurosurgery;  Laterality: N/A;  . VULVAR LESION REMOVAL  01/01/2009   She had a 1-cm area of erythema to the right of the urethral meatus    Social History   Social History  . Marital status: Divorced    Spouse name: N/A  . Number of children: N/A  . Years of education: N/A   Occupational History  . Not on file.   Social History Main Topics  . Smoking status: Former Smoker    Quit date: 03/09/1991  . Smokeless tobacco: Former NeurosurgeonUser  . Alcohol use No  . Drug use: No  . Sexual activity: Not on file   Other Topics Concern  . Not on file   Social History Narrative  . No narrative on file    Allergies  Allergen Reactions  . Crestor [Rosuvastatin Calcium] Other (See Comments)    "bones ache all over"  . Rosuvastatin Other (See Comments)    "bones ache all over" Bone pain all over body    Family History  Problem Relation Age of Onset  . Hyperlipidemia Mother     Prior to Admission  medications   Medication Sig Start Date End Date Taking? Authorizing Provider  aspirin EC 81 MG EC tablet Take 1 tablet (81 mg total) by mouth daily. 12/17/14  Yes Evlyn Kanner Love, PA-C  Calcium Carb-Cholecalciferol (CALCIUM 600 + D PO) Take 1 tablet by mouth daily.    Yes Historical Provider, MD  digoxin (LANOXIN) 0.125 MG tablet TAKE 1 TABLET BY MOUTH DAILY 03/09/16  Yes Pricilla Riffle, MD  latanoprost (XALATAN) 0.005 % ophthalmic solution Place 1 drop into both eyes at bedtime. 12/22/16  Yes Historical Provider, MD  Magnesium 400 MG CAPS Take  400 mg by mouth daily.    Yes Historical Provider, MD  Omega-3 Fatty Acids (FISH OIL ULTRA) 1400 MG CAPS Take 1,400 mg by mouth daily.    Yes Historical Provider, MD  Turmeric 500 MG CAPS Take 500 mg by mouth daily.    Yes Historical Provider, MD    Physical Exam: Vitals:   12/26/16 0915 12/26/16 1030 12/26/16 1100 12/26/16 1130  BP: 120/60 114/60 118/70 (!) 110/52  Pulse:    83  Resp: 16 18 24 20   Temp:      TempSrc:      SpO2: 97% 95% 96% 95%  Weight:      Height:         General: Appears calm and comfortable If remains still Eyes: PERRLA, EOMI, normal lids, iris ENT:  grossly normal hearing, lips & tongue, mucous membranes moist and intact Neck: no lymphoadenopathy, masses or thyromegaly Cardiovascular: RRR, no m/r/g. No JVD, carotid bruits. No LE edema.  Respiratory: bilateral no wheezes, rales, rhonchi or cracles. Normal respiratory effort. No accessory muscle use observed Abdomen: soft, non-tender, non-distended, no organomegaly or masses appreciated. BS present in all quadrants Skin: no rash, ulcers or induration seen on limited exam Musculoskeletal: grossly normal tone BUE/BLE, good ROM, no bony abnormality or joint deformities observed Psychiatric: grossly normal mood and affect, speech fluent and appropriate, alert and oriented x3 Neurologic: CN II-XII grossly intact, moves all extremities in coordinated fashion, sensation intact  Labs on Admission: I have personally reviewed following labs and imaging studies  CBC, BMP  GFR: Estimated Creatinine Clearance: 41.6 mL/min (by C-G formula based on SCr of 0.5 mg/dL).   Creatinine Clearance: Estimated Creatinine Clearance: 41.6 mL/min (by C-G formula based on SCr of 0.5 mg/dL).   Radiological Exams on Admission: Dg Chest 1 View  Result Date: 12/26/2016 CLINICAL DATA:  Fall EXAM: CHEST 1 VIEW COMPARISON:  11/13/2014 FINDINGS: Mild cardiomegaly. There is hyperinflation of the lungs compatible with COPD. No confluent  opacities or effusions. No acute bony abnormality. IMPRESSION: COPD.  Cardiomegaly.  No active disease. Electronically Signed   By: Charlett Nose M.D.   On: 12/26/2016 09:01   Ct Pelvis Wo Contrast  Result Date: 12/26/2016 CLINICAL DATA:  Status post fall. EXAM: CT PELVIS WITHOUT CONTRAST TECHNIQUE: Multidetector CT imaging of the pelvis was performed following the standard protocol without intravenous contrast. COMPARISON:  None. FINDINGS: Urinary Tract: No renal abnormality visualized. The urinary bladder is significantly distended. Bowel: Diffuse left colonic diverticulosis without evidence of diverticulitis on this nonenhanced exam. Vascular/Lymphatic: No pathologically enlarged lymph nodes. Advanced calcific atherosclerotic disease of the aorta and its branches. IVC filter is in place. Reproductive:  Post hysterectomy. Other:  None. Musculoskeletal: There is a comminuted impacted fracture of the right inferior pubic ramus with buckle 0.8 similarly comminuted fracture of the superior pubic ramus on the right. There is a minimally displaced fracture of the right  ischium and ilium extending to the SI joint. There is a questionable tiny avulsion fracture of the right anterior sacrum at the anterior portion of the right sacroiliac joint. There is generalized advanced osteopenia. No other fractures are seen. There are severe osteoarthritic changes of the visualized portion of the lumbosacral spine with likely degenerative grade 1 anterolisthesis of L5 on S1. IMPRESSION: Significant distension of the urinary bladder. Please correlate clinically. Advanced calcific atherosclerotic disease of the aorta. Advanced osteopenia. Right-sided pelvic fractures to include comminuted impacted fractures of the right inferior and superior pubic rami, minimally displaced fractures of the right ischium and ileum extending to the SI joint, and probable avulsion fractures of the right sacrum at the inferior SI joint. Severe  osteoarthritic changes of the visualized portion of the lumbosacral spine with grade 1 anterolisthesis of L5 on S1. Electronically Signed   By: Ted Mcalpine M.D.   On: 12/26/2016 10:41   Dg Hip Unilat With Pelvis 2-3 Views Right  Result Date: 12/26/2016 CLINICAL DATA:  Right groin pain. EXAM: DG HIP (WITH OR WITHOUT PELVIS) 2-3V RIGHT COMPARISON:  Lumbar spine 08/10/2016 FINDINGS: New displaced fractures involving the right inferior and superior pubic rami. Possible fractures involving the right side of the sacrum demonstrated by disruption of the sacral arcuate lines. Atherosclerotic calcifications in the iliac arteries. The right hip is located without a fracture. IMPRESSION: Displaced right pubic rami fractures.  Possible sacral fractures. Electronically Signed   By: Richarda Overlie M.D.   On: 12/26/2016 09:02    EKG: Independently reviewed - atrial fibrillation with controlled ventricular response, no acute ST-T wave changes Assessment/Plan Principal Problem:   Pelvic fracture (HCC) Active Problems:   Benign hypertensive heart disease without heart failure   HLD (hyperlipidemia)   Near syncope   Orthostatic hypotension   A-fib (HCC)    Fall most likely associated with syncopal or near syncopal spell and/or orthostatic Continue to monitor patient on telemetry to rule out tachyarrhythmia or bradycardia that could be a causative factor Check orthostatic vital signs when patient is able to tolerate positional changes Will repeat echocardiogram to assess valvular structures  Multiple right sided pelvic fractures Orthopedic consult requested, will follow their recommendations  Atrial fibrillation with CHADsVasc score 3 She was on Coumadin prior to spinal hemorrhage and now on aspirin which will be continued Doubt patient will be good candidate to resume full anticoagulation therapy Will check Digoxin level  Hypertension - historic diagnosis Blood pressure is well-controlled without  any antihypertensive medications Continue to monitor  Hyperlipidemia - last lipid profile in 2013 revealed cholesterol of 230 and LDL 126 Patient is on fish oil Will update lipid profile  DVT prophylaxis: Heparin Code Status: Full Family Communication: At bedside Disposition Plan: Telemetry Consults called: Orthopedic consult requested by ED physician Admission status: Inpatient   Raymon Mutton, [place] Pager: [phone] [facility]  If 7PM-7AM, please contact night-coverage [email or web address] Password [email or web address]  12/26/2016, 1:14 PM

## 2016-12-27 DIAGNOSIS — S329XXA Fracture of unspecified parts of lumbosacral spine and pelvis, initial encounter for closed fracture: Secondary | ICD-10-CM

## 2016-12-27 DIAGNOSIS — E785 Hyperlipidemia, unspecified: Secondary | ICD-10-CM

## 2016-12-27 DIAGNOSIS — R55 Syncope and collapse: Secondary | ICD-10-CM

## 2016-12-27 LAB — COMPREHENSIVE METABOLIC PANEL
ALT: 18 U/L (ref 14–54)
AST: 28 U/L (ref 15–41)
Albumin: 3.2 g/dL — ABNORMAL LOW (ref 3.5–5.0)
Alkaline Phosphatase: 50 U/L (ref 38–126)
Anion gap: 6 (ref 5–15)
BUN: 19 mg/dL (ref 6–20)
CO2: 28 mmol/L (ref 22–32)
Calcium: 8.7 mg/dL — ABNORMAL LOW (ref 8.9–10.3)
Chloride: 104 mmol/L (ref 101–111)
Creatinine, Ser: 0.61 mg/dL (ref 0.44–1.00)
GFR calc Af Amer: >60 mL/min (ref >60)
GFR calc non Af Amer: >60 mL/min (ref >60)
Glucose, Bld: 106 mg/dL — ABNORMAL HIGH (ref 65–99)
Potassium: 4 mmol/L (ref 3.5–5.1)
Sodium: 138 mmol/L (ref 135–145)
Total Bilirubin: 0.4 mg/dL (ref 0.3–1.2)
Total Protein: 5.9 g/dL — ABNORMAL LOW (ref 6.5–8.1)

## 2016-12-27 LAB — CBC
HCT: 40.4 % (ref 36.0–46.0)
Hemoglobin: 12.9 g/dL (ref 12.0–15.0)
MCH: 31.7 pg (ref 26.0–34.0)
MCHC: 31.9 g/dL (ref 30.0–36.0)
MCV: 99.3 fL (ref 78.0–100.0)
Platelets: 156 10*3/uL (ref 150–400)
RBC: 4.07 MIL/uL (ref 3.87–5.11)
RDW: 12.6 % (ref 11.5–15.5)
WBC: 7.5 10*3/uL (ref 4.0–10.5)

## 2016-12-27 LAB — PROTIME-INR
INR: 1.15
Prothrombin Time: 14.8 seconds (ref 11.4–15.2)

## 2016-12-27 MED ORDER — ALUM & MAG HYDROXIDE-SIMETH 200-200-20 MG/5ML PO SUSP
30.0000 mL | Freq: Four times a day (QID) | ORAL | Status: DC | PRN
  Administered 2016-12-27 – 2016-12-29 (×3): 30 mL via ORAL
  Filled 2016-12-27 (×3): qty 30

## 2016-12-27 NOTE — Progress Notes (Signed)
PROGRESS NOTE        PATIENT DETAILS Name: [name] Weber Age: 83 y.o. Sex: female Date of Birth: 1931-05-14 Admit Date: 12/26/2016 Admitting Physician Pete Glatter, MD WUJ:[id number] NEVILL, MD  Brief Narrative: Patient is a 83 y.o. female with past medical history of atrial fibrillation no longer an anticoagulation (prior history of epidural hemorrhage) who sustained a mechanical fall/syncope and was found to have pelvic fracture.  Subjective: Lying comfortably in bed-pain appears to be well controlled.  Assessment/Plan: Pelvic fracture: Secondary to fall/syncope, orthopedics following and planning on surgical repair. Defer further to orthopedics  Mechanical fall vs syncope: Patient not entirely sure whether she lost consciousness, EKG/telemetry shows atrial fibrillation. Obtain echocardiogram.  Chronic atrial fibrillation: Rate controlled, continue digoxin. No longer on anticoagulation given prior history of epidural hematoma. Continue aspirin  History of hypertension: Blood pressure currently controlled without the use of any antihypertensives. Follow  DVT Prophylaxis: Prophylactic Heparin   Code Status: Full code   Family Communication: Son at bedside   Disposition Plan: Remain inpatient-but will plan SNF on discharge in the next 3-4 days  Antimicrobial agents: Anti-infectives    None     Procedures: None  CONSULTS:  orthopedic surgery  Time spent: 25 minutes-Greater than 50% of this time was spent in counseling, explanation of diagnosis, planning of further management, and coordination of care.  MEDICATIONS: Scheduled Meds: . acetaminophen  1,000 mg Oral Once  . aspirin EC  81 mg Oral Daily  . digoxin  125 mcg Oral Daily  . heparin  5,000 Units Subcutaneous Q8H  . latanoprost  1 drop Both Eyes QHS  . omega-3 acid ethyl esters  1 g Oral Daily   Continuous Infusions: PRN Meds:.acetaminophen **OR** acetaminophen,  HYDROcodone-acetaminophen, morphine injection, ondansetron **OR** ondansetron (ZOFRAN) IV, polyethylene glycol, traZODone   PHYSICAL EXAM: Vital signs: Vitals:   12/26/16 1338 12/26/16 2125 12/27/16 0610 12/27/16 0957  BP: (!) 145/76 106/62 133/69   Pulse: 84 83 100 85  Resp: 18 16 18    Temp:  99 F (37.2 C) 98.8 F (37.1 C)   TempSrc:  Oral Oral   SpO2: 94% 95% 93%   Weight:   52.2 kg (115 lb 1.3 oz)   Height:       Filed Weights   12/26/16 0753 12/27/16 0610  Weight: 49.4 kg (109 lb) 52.2 kg (115 lb 1.3 oz)   Body mass index is 18.57 kg/m.   General appearance :Awake, alert, not in any distress. Speech Clear.  Eyes:, pupils equally reactive to light and accomodation,no scleral icterus.Pink conjunctiva HEENT: Atraumatic and Normocephalic Neck: supple, no JVD. No cervical lymphadenopathy. No thyromegaly Resp:Good air entry bilaterally, no added sounds  CVS: S1 S2 irregular GI: Bowel sounds present, Non tender and not distended with no gaurding, rigidity or rebound.No organomegaly Extremities: B/L Lower Ext shows no edema, both legs are warm to touch Neurology:  speech clear,Non focal, sensation is grossly intact. Psychiatric: Normal judgment and insight. Alert and oriented x 3. Normal mood. Musculoskeletal:No digital cyanosis Skin:No Rash, warm and dry Wounds:N/A  I have personally reviewed following labs and imaging studies  LABORATORY DATA: CBC:  Recent Labs Lab 12/26/16 0810 12/26/16 0918 12/27/16 0357  WBC 11.4*  --  7.5  NEUTROABS 9.7*  --   --   HGB 13.7 14.6 12.9  HCT 41.7 43.0 40.4  MCV  98.3  --  99.3  PLT 157  --  156    Basic Metabolic Panel:  Recent Labs Lab 12/26/16 0810 12/26/16 0918 12/27/16 0357  NA 140 141 138  K 4.1 4.1 4.0  CL 101 101 104  CO2 27  --  28  GLUCOSE 123* 124* 106*  BUN 21* 25* 19  CREATININE 0.57 0.50 0.61  CALCIUM 9.2  --  8.7*    GFR: Estimated Creatinine Clearance: 43.9 mL/min (by C-G formula based on SCr of  0.61 mg/dL).  Liver Function Tests:  Recent Labs Lab 12/27/16 0357  AST 28  ALT 18  ALKPHOS 50  BILITOT 0.4  PROT 5.9*  ALBUMIN 3.2*   No results for input(s): LIPASE, AMYLASE in the last 168 hours. No results for input(s): AMMONIA in the last 168 hours.  Coagulation Profile:  Recent Labs Lab 12/26/16 0810 12/27/16 0357  INR 1.05 1.15    Cardiac Enzymes: No results for input(s): CKTOTAL, CKMB, CKMBINDEX, TROPONINI in the last 168 hours.  BNP (last 3 results) No results for input(s): PROBNP in the last 8760 hours.  HbA1C: No results for input(s): HGBA1C in the last 72 hours.  CBG: No results for input(s): GLUCAP in the last 168 hours.  Lipid Profile: No results for input(s): CHOL, HDL, LDLCALC, TRIG, CHOLHDL, LDLDIRECT in the last 72 hours.  Thyroid Function Tests: No results for input(s): TSH, T4TOTAL, FREET4, T3FREE, THYROIDAB in the last 72 hours.  Anemia Panel: No results for input(s): VITAMINB12, FOLATE, FERRITIN, TIBC, IRON, RETICCTPCT in the last 72 hours.  Urine analysis:    Component Value Date/Time   COLORURINE YELLOW 12/26/2016 1204   APPEARANCEUR CLEAR 12/26/2016 1204   LABSPEC 1.018 12/26/2016 1204   PHURINE 7.0 12/26/2016 1204   GLUCOSEU NEGATIVE 12/26/2016 1204   HGBUR NEGATIVE 12/26/2016 1204   BILIRUBINUR NEGATIVE 12/26/2016 1204   KETONESUR NEGATIVE 12/26/2016 1204   PROTEINUR NEGATIVE 12/26/2016 1204   UROBILINOGEN 0.2 01/23/2015 0959   NITRITE NEGATIVE 12/26/2016 1204   LEUKOCYTESUR NEGATIVE 12/26/2016 1204    Sepsis Labs: Lactic Acid, Venous No results found for: LATICACIDVEN  MICROBIOLOGY: No results found for this or any previous visit (from the past 240 hour(s)).  RADIOLOGY STUDIES/RESULTS: Dg Chest 1 View  Result Date: 12/26/2016 CLINICAL DATA:  Fall EXAM: CHEST 1 VIEW COMPARISON:  11/13/2014 FINDINGS: Mild cardiomegaly. There is hyperinflation of the lungs compatible with COPD. No confluent opacities or effusions. No  acute bony abnormality. IMPRESSION: COPD.  Cardiomegaly.  No active disease. Electronically Signed   By: Charlett NoseKevin  Dover M.D.   On: 12/26/2016 09:01   Ct Pelvis Wo Contrast  Result Date: 12/26/2016 CLINICAL DATA:  Status post fall. EXAM: CT PELVIS WITHOUT CONTRAST TECHNIQUE: Multidetector CT imaging of the pelvis was performed following the standard protocol without intravenous contrast. COMPARISON:  None. FINDINGS: Urinary Tract: No renal abnormality visualized. The urinary bladder is significantly distended. Bowel: Diffuse left colonic diverticulosis without evidence of diverticulitis on this nonenhanced exam. Vascular/Lymphatic: No pathologically enlarged lymph nodes. Advanced calcific atherosclerotic disease of the aorta and its branches. IVC filter is in place. Reproductive:  Post hysterectomy. Other:  None. Musculoskeletal: There is a comminuted impacted fracture of the right inferior pubic ramus with buckle 0.8 similarly comminuted fracture of the superior pubic ramus on the right. There is a minimally displaced fracture of the right ischium and ilium extending to the SI joint. There is a questionable tiny avulsion fracture of the right anterior sacrum at the anterior portion of the  right sacroiliac joint. There is generalized advanced osteopenia. No other fractures are seen. There are severe osteoarthritic changes of the visualized portion of the lumbosacral spine with likely degenerative grade 1 anterolisthesis of L5 on S1. IMPRESSION: Significant distension of the urinary bladder. Please correlate clinically. Advanced calcific atherosclerotic disease of the aorta. Advanced osteopenia. Right-sided pelvic fractures to include comminuted impacted fractures of the right inferior and superior pubic rami, minimally displaced fractures of the right ischium and ileum extending to the SI joint, and probable avulsion fractures of the right sacrum at the inferior SI joint. Severe osteoarthritic changes of the  visualized portion of the lumbosacral spine with grade 1 anterolisthesis of L5 on S1. Electronically Signed   By: Ted Mcalpine M.D.   On: 12/26/2016 10:41   Dg Hip Unilat With Pelvis 2-3 Views Right  Result Date: 12/26/2016 CLINICAL DATA:  Right groin pain. EXAM: DG HIP (WITH OR WITHOUT PELVIS) 2-3V RIGHT COMPARISON:  Lumbar spine 08/10/2016 FINDINGS: New displaced fractures involving the right inferior and superior pubic rami. Possible fractures involving the right side of the sacrum demonstrated by disruption of the sacral arcuate lines. Atherosclerotic calcifications in the iliac arteries. The right hip is located without a fracture. IMPRESSION: Displaced right pubic rami fractures.  Possible sacral fractures. Electronically Signed   By: Richarda Overlie M.D.   On: 12/26/2016 09:02     LOS: 1 day   Jeoffrey Massed, MD  [facility] Pager:[phone]  If 7PM-7AM, please contact night-coverage [email or web address] Password [email or web address] 12/27/2016, 11:30 AM

## 2016-12-28 ENCOUNTER — Inpatient Hospital Stay (HOSPITAL_COMMUNITY): Payer: Medicare Other

## 2016-12-28 ENCOUNTER — Ambulatory Visit: Payer: Medicare Other | Admitting: Physical Therapy

## 2016-12-28 DIAGNOSIS — S32810D Multiple fractures of pelvis with stable disruption of pelvic ring, subsequent encounter for fracture with routine healing: Secondary | ICD-10-CM

## 2016-12-28 DIAGNOSIS — I10 Essential (primary) hypertension: Secondary | ICD-10-CM

## 2016-12-28 DIAGNOSIS — I272 Pulmonary hypertension, unspecified: Secondary | ICD-10-CM

## 2016-12-28 DIAGNOSIS — F411 Generalized anxiety disorder: Secondary | ICD-10-CM

## 2016-12-28 DIAGNOSIS — T07XXXA Unspecified multiple injuries, initial encounter: Secondary | ICD-10-CM

## 2016-12-28 DIAGNOSIS — E8809 Other disorders of plasma-protein metabolism, not elsewhere classified: Secondary | ICD-10-CM

## 2016-12-28 DIAGNOSIS — I119 Hypertensive heart disease without heart failure: Secondary | ICD-10-CM

## 2016-12-28 DIAGNOSIS — M8000XA Age-related osteoporosis with current pathological fracture, unspecified site, initial encounter for fracture: Secondary | ICD-10-CM

## 2016-12-28 DIAGNOSIS — I4891 Unspecified atrial fibrillation: Secondary | ICD-10-CM

## 2016-12-28 DIAGNOSIS — R52 Pain, unspecified: Secondary | ICD-10-CM

## 2016-12-28 DIAGNOSIS — D696 Thrombocytopenia, unspecified: Secondary | ICD-10-CM

## 2016-12-28 DIAGNOSIS — E46 Unspecified protein-calorie malnutrition: Secondary | ICD-10-CM

## 2016-12-28 DIAGNOSIS — S329XXD Fracture of unspecified parts of lumbosacral spine and pelvis, subsequent encounter for fracture with routine healing: Secondary | ICD-10-CM

## 2016-12-28 DIAGNOSIS — T148XXA Other injury of unspecified body region, initial encounter: Secondary | ICD-10-CM

## 2016-12-28 LAB — CBC
HCT: 42.3 % (ref 36.0–46.0)
Hemoglobin: 13.7 g/dL (ref 12.0–15.0)
MCH: 32.2 pg (ref 26.0–34.0)
MCHC: 32.4 g/dL (ref 30.0–36.0)
MCV: 99.5 fL (ref 78.0–100.0)
Platelets: 146 10*3/uL — ABNORMAL LOW (ref 150–400)
RBC: 4.25 MIL/uL (ref 3.87–5.11)
RDW: 12.3 % (ref 11.5–15.5)
WBC: 7.5 10*3/uL (ref 4.0–10.5)

## 2016-12-28 LAB — ECHOCARDIOGRAM COMPLETE
FS: 34 % (ref 28–44)
Height: 66 in
IVS/LV PW RATIO, ED: 0.73
LA ID, A-P, ES: 33 mm
LA diam end sys: 33 mm
LA diam index: 2.04 cm/m2
LA vol A4C: 48.6 ml
LA vol index: 30.1 mL/m2
LA vol: 48.7 mL
LV PW d: 9.63 mm — AB (ref 0.6–1.1)
LVOT area: 3.14 cm2
LVOT diameter: 20 mm
RV sys press: 40 mmHg
Reg peak vel: 303 cm/s
TR max vel: 303 cm/s
Weight: 1936.52 oz

## 2016-12-28 LAB — SURGICAL PCR SCREEN
MRSA, PCR: NEGATIVE
Staphylococcus aureus: NEGATIVE

## 2016-12-28 NOTE — Evaluation (Signed)
Physical Therapy Evaluation Patient Details Name: [name] Weber MRN: [id number] DOB: 12-05-1930 Today's Date: 12/28/2016   History of Present Illness   [name] Weber is a 83 y.o. female with history of A fib, T-9 paraplegia s/p thoracic laminectomy for abscess (off anticoagulation due to epidural hemorrhage), L 2 compression fracture--CIR stay 05/2016, anxiety disorder, bilateral foot drop managed by AFOs, gait disorder with ongoing outpatient therapy;  who was admitted on 12/26/16 after fall due to syncope and inability to walk. History taken from chart review and patient. She was found to have pelvic ring fracture as well as reports of new low back pain. Dr. Carola Frost consulted and non-surgical management recommended. To be WBAT with B-AFOs and walker.  Clinical Impression  Pt admitted with above diagnosis. Pt currently with functional limitations due to the deficits listed below (see PT Problem List). Pt was able to stand and pivot to chair with mod assist mainly due to pain.  Feel pt is a good rehab candidate. Very motivated and should progress well.  Will follow acutely.  Pt will benefit from skilled PT to increase their independence and safety with mobility to allow discharge to the venue listed below.      Follow Up Recommendations CIR;Supervision/Assistance - 24 hour    Equipment Recommendations  None recommended by PT    Recommendations for Other Services Rehab consult     Precautions / Restrictions Precautions Precautions: Fall Required Braces or Orthoses: Other Brace/Splint Other Brace/Splint: Bil AFOs Restrictions Weight Bearing Restrictions: Yes RLE Weight Bearing: Weight bearing as tolerated LLE Weight Bearing: Weight bearing as tolerated      Mobility  Bed Mobility Overal bed mobility: Needs Assistance Bed Mobility: Supine to Sit     Supine to sit: Mod assist;+2 for physical assistance     General bed mobility comments: Needed assist for LEs and elevation of  trunk.   Transfers Overall transfer level: Needs assistance Equipment used: Rolling walker (2 wheeled) Transfers: Sit to/from [employer] Sit to Stand: Mod assist;+2 physical assistance;Max assist Stand pivot transfers: Mod assist;+2 physical assistance       General transfer comment: Pt needed close to max assist to power up.  Had difficulty with foot placement.  Pt was able to take pivotal steps to chair with assist and cues.  Needed assist to control descent into chair due to pain.  Attempted to stand again from chair with less assist to get up but could not advance feet due to pain.    Ambulation/Gait             General Gait Details: Unable today  Stairs            Wheelchair Mobility    Modified Rankin (Stroke Patients Only)       Balance Overall balance assessment: Needs assistance;History of Falls Sitting-balance support: No upper extremity supported;Feet supported Sitting balance-Leahy Scale: Fair   Postural control: Posterior lean Standing balance support: Bilateral upper extremity supported;During functional activity Standing balance-Leahy Scale: Poor Standing balance comment: Needed mod assist with RW with pt with significant posterior lean.  Pt needed cues for postural stability.  Pt limited by pain.                              Pertinent Vitals/Pain Pain Assessment: 0-10 Pain Score: 4  Pain Location: pelvis Pain Descriptors / Indicators: Aching;Grimacing;Guarding;Sore Pain Intervention(s): Limited activity within patient's tolerance;Monitored during session;Premedicated before session;Repositioned  VSS  Home Living Family/patient expects to be discharged to:: Private residence Living Arrangements: Alone Available Help at Discharge: Family;Friend(s);Available PRN/intermittently Type of Home: Apartment Home Access: Other (comment) (stair lift)     Home Layout: One level Home Equipment: Walker - 4 wheels;Walker -  2 wheels;Toilet riser;Shower seat;Hand held shower head;Grab bars - tub/shower;Adaptive equipment      Prior Function Level of Independence: Independent with assistive device(s)         Comments: Pt uses RW indoors and rollator outdoors for ambulation PTA. Has Bil  AFOs. Drives, cooks.      Hand Dominance   Dominant Hand: Right    Extremity/Trunk Assessment   Upper Extremity Assessment Upper Extremity Assessment: Defer to OT evaluation    Lower Extremity Assessment Lower Extremity Assessment: RLE deficits/detail;LLE deficits/detail RLE Deficits / Details: grossly 3/5 except DF 0/5 RLE: Unable to fully assess due to pain LLE Deficits / Details: grossly 3/5 except DF 0/5 LLE: Unable to fully assess due to pain    Cervical / Trunk Assessment Cervical / Trunk Assessment: Kyphotic  Communication   Communication: HOH (has hearing aides)  Cognition Arousal/Alertness: Awake/alert Behavior During Therapy: WFL for tasks assessed/performed Overall Cognitive Status: Within Functional Limits for tasks assessed                      General Comments General comments (skin integrity, edema, etc.): Needed total assist to don AFOs.      Exercises General Exercises - Lower Extremity Ankle Circles/Pumps: AROM;Both;5 reps;Supine Quad Sets: AROM;Both;10 reps;Supine Gluteal Sets: AROM;Both;10 reps;Supine Long Arc Quad: AROM;Both;5 reps;Seated   Assessment/Plan    PT Assessment Patient needs continued PT services  PT Problem List Decreased activity tolerance;Decreased balance;Decreased mobility;Decreased knowledge of use of DME;Decreased knowledge of precautions;Decreased safety awareness;Pain;Decreased strength;Decreased range of motion          PT Treatment Interventions DME instruction;Gait training;Functional mobility training;Therapeutic activities;Therapeutic exercise;Balance training;Patient/family education    PT Goals (Current goals can be found in the Care Plan  section)  Acute Rehab PT Goals Patient Stated Goal: to get better PT Goal Formulation: With patient Time For Goal Achievement: 01/11/17 Potential to Achieve Goals: Good    Frequency Min 5X/week   Barriers to discharge Decreased caregiver support      Co-evaluation               End of Session Equipment Utilized During Treatment: Gait belt Activity Tolerance: Patient limited by fatigue;Patient limited by pain Patient left: in chair;with call bell/phone within reach Nurse Communication: Mobility status         Time: [id number] PT Time Calculation (min) (ACUTE ONLY): 23 min   Charges:   PT Evaluation $PT Eval Moderate Complexity: 1 Procedure PT Treatments $Therapeutic Activity: 8-22 mins   PT G Codes:        Berline LopesDawn F Haidar Muse 12/28/2016, 5:24 PM [facility] HospitalDawn Donte Lenzo,PT Acute Rehabilitation [phone] (pager)

## 2016-12-28 NOTE — Consult Note (Signed)
Physical Medicine and Rehabilitation Consult   Reason for Consult: Pelvic fractures in setting of fall and gait disorder Referring Physician: Dr. Carola Frost   HPI: [name] Weber is a 83 y.o. female with history of A fib, T-9 paraplegia s/p thoracic laminectomy for abscess (off anticoagulation due to epidural hemorrhage), L 2 compression fracture--CIR stay 05/2016, anxiety disorder, bilateral foot drop managed by AFOs, gait disorder with ongoing outpatient therapy;  who was admitted on 12/26/16 after fall due to syncope and inability to walk. History taken from chart review and patient. She was found to have pelvic ring fracture as well as reports of new low back pain. Dr. Carola Frost consulted and non-surgical management recommended. To be WBAT with B-AFOs and walker. Therapy evaluations to be done today and CIR consulted for follow up therapy.   Patient discharged to SNF after CIR stay due to lack of social supports. She has been going to outpatient PT twice a week since Oct.   Review of Systems  HENT: Negative for hearing loss and tinnitus.   Eyes: Negative for blurred vision and double vision.  Respiratory: Negative for cough and shortness of breath.   Cardiovascular: Negative for chest pain and palpitations.  Gastrointestinal: Negative for constipation, heartburn and nausea.  Genitourinary: Negative for dysuria and urgency.  Musculoskeletal: Positive for back pain and myalgias.  Skin: Negative for itching and rash.  Neurological: Negative for dizziness and headaches.  Psychiatric/Behavioral: Negative for depression. The patient is nervous/anxious.   All other systems reviewed and are negative.    Past Medical History:  Diagnosis Date  . Fatigue   . Malaise   . Myalgia   . PAC (premature atrial contraction)    ISOLATED  . Palpitations    OCCASSIONAL  . Paroxysmal atrial flutter (HCC)   . PVC's (premature ventricular contractions)    ISOLATED    Past Surgical History:    Procedure Laterality Date  . THORACIC LAMINECTOMY FOR EPIDURAL ABSCESS N/A 11/16/2014   Procedure: THORACIC LAMINECTOMY FOR EPIDURAL ABSCESS;  Surgeon: Karn Cassis, MD;  Location: [facility];  Service: Neurosurgery;  Laterality: N/A;  . VULVAR LESION REMOVAL  01/01/2009   She had a 1-cm area of erythema to the right of the urethral meatus    Family History  Problem Relation Age of Onset  . Hyperlipidemia Mother     Social History:  Lives alone. Retired --use to work for [employer]. She was independent with walker and AFOs. Family assists with home management and shopping. reports that she quit smoking about 25 years ago. She has quit using smokeless tobacco. She reports that she does not drink alcohol or use drugs.     Allergies  Allergen Reactions  . Crestor [Rosuvastatin Calcium] Other (See Comments)    "bones ache all over"  . Rosuvastatin Other (See Comments)    "bones ache all over" Bone pain all over body    Medications Prior to Admission  Medication Sig Dispense Refill  . aspirin EC 81 MG EC tablet Take 1 tablet (81 mg total) by mouth daily.    . Calcium Carb-Cholecalciferol (CALCIUM 600 + D PO) Take 1 tablet by mouth daily.     . digoxin (LANOXIN) 0.125 MG tablet TAKE 1 TABLET BY MOUTH DAILY 90 tablet 3  . latanoprost (XALATAN) 0.005 % ophthalmic solution Place 1 drop into both eyes at bedtime.    . Magnesium 400 MG CAPS Take 400 mg by mouth daily.     Marland Kitchen  Omega-3 Fatty Acids (FISH OIL ULTRA) 1400 MG CAPS Take 1,400 mg by mouth daily.     . Turmeric 500 MG CAPS Take 500 mg by mouth daily.       Home: Home Living Family/patient expects to be discharged to:: Private residence Living Arrangements: Alone  Functional History:   Functional Status:  Mobility:          ADL:    Cognition:      Blood pressure (!) 150/78, pulse 80, temperature 98.9 F (37.2 C), temperature source Oral, resp. rate 16, height 5\' 6"  (1.676 m), weight 54.9 kg (121 lb 0.5 oz),  SpO2 97 %. Physical Exam  Nursing note and vitals reviewed. Constitutional: She is oriented to person, place, and time. She appears well-developed. She appears cachectic. She is cooperative.  Frail appearing elderly female.   HENT:  Head: Normocephalic and atraumatic.  Mouth/Throat: Oropharynx is clear and moist.  Eyes: Conjunctivae and EOM are normal. Pupils are equal, round, and reactive to light. No scleral icterus.  Neck: Normal range of motion. Neck supple.  Cardiovascular:  Irregularly irregular  Respiratory: Effort normal and breath sounds normal. No stridor. No respiratory distress.  GI: Soft. Bowel sounds are normal. She exhibits no distension. There is no tenderness.  Musculoskeletal: She exhibits edema and tenderness.  Bilateral foot drop with contractures.  Pain with attempts at ROM right > LLE.   Neurological: She is alert and oriented to person, place, and time.  Speech clear.  Follows commands without difficulty.  Sensation intact to light touch Motor: B/l UE 4/5 proximal to distal RLE: HF, KE 3/5, ADF/PF 2/5 with contracture LLE: HF, KE 4-/5, ADF/PF 2/5 with contracture  Skin: Skin is warm and dry. She is not diaphoretic.  Psychiatric: She has a normal mood and affect. Her behavior is normal. Judgment and thought content normal.    Results for orders placed or performed during the hospital encounter of 12/26/16 (from the past 24 hour(s))  Surgical pcr screen     Status: None   Collection Time: 12/28/16  6:51 AM  Result Value Ref Range   MRSA, PCR NEGATIVE NEGATIVE   Staphylococcus aureus NEGATIVE NEGATIVE  CBC     Status: Abnormal   Collection Time: 12/28/16  7:04 AM  Result Value Ref Range   WBC 7.5 4.0 - 10.5 K/uL   RBC 4.25 3.87 - 5.11 MIL/uL   Hemoglobin 13.7 12.0 - 15.0 g/dL   HCT 40.9 81.1 - 91.4 %   MCV 99.5 78.0 - 100.0 fL   MCH 32.2 26.0 - 34.0 pg   MCHC 32.4 30.0 - 36.0 g/dL   RDW 78.2 95.6 - 21.3 %   Platelets 146 (L) 150 - 400 K/uL   No  results found.  Assessment/Plan: Diagnosis: Pelvic fractures Labs and images independently reviewed.  Records reviewed and summated above.  1. Does the need for close, 24 hr/day medical supervision in concert with the patient's rehab needs make it unreasonable for this patient to be served in a less intensive setting? Potentially  2. Co-Morbidities requiring supervision/potential complications: A fib (monitor HR with increased physical activity), T-9 paraplegia s/p thoracic laminectomy for abscess (off anticoagulation due to epidural hemorrhage), L 2 compression fracture, anxiety disorder (meds as needed), bilateral foot drop managed by AFOs, HTN (monitor and provide prns in accordance with increased physical exertion and pain), pain (Biofeedback training with therapies to help reduce reliance on opiate pain medications, monitor pain control during therapies, and sedation at rest and titrate  to maximum efficacy to ensure participation and gains in therapies), hypoalbuminemia (maximize nutrition for overall health and wound healing), Thrombocytopenia (< 60,000/mm3 no resistive exercise), pulm HTN (Monitor in accordance with increased physical activity and avoid UE resistance excercises), severe osteoporosis with pathological fractures (consider scan, meds) 3. Due to bladder management, bowel management, safety, disease management, pain management and patient education, does the patient require 24 hr/day rehab nursing? Yes 4. Does the patient require coordinated care of a physician, rehab nurse, PT (1-2 hrs/day, 5 days/week) and OT (1-2 hrs/day, 5 days/week) to address physical and functional deficits in the context of the above medical diagnosis(es)? Potentially Addressing deficits in the following areas: balance, endurance, locomotion, strength, transferring, bathing, dressing, toileting and psychosocial support 5. Can the patient actively participate in an intensive therapy program of at least 3 hrs of  therapy per day at least 5 days per week? Potentially 6. The potential for patient to make measurable gains while on inpatient rehab is TBD 7. Anticipated functional outcomes upon discharge from inpatient rehab are TBD  with PT, TBD with OT, n/a with SLP. 8. Estimated rehab length of stay to reach the above functional goals is: TBD. 9. Does the patient have adequate social supports and living environment to accommodate these discharge functional goals? Potentially 10. Anticipated D/C setting: Home 11. Anticipated post D/C treatments: HH therapy and Home excercise program 12. Overall Rehab/Functional Prognosis: good  RECOMMENDATIONS: This patient's condition is appropriate for continued rehabilitative care in the following setting: Will need to await evaluations by therapies to determine most appropriate discharge destination once medical workup and final plan has been completed. If it appears pt may be able to reach an indepenent level of functioning, would recommend CIR, otherwise SNF given falls and recurrent fractures. Patient has agreed to participate in recommended program. Potentially Note that insurance prior authorization may be required for reimbursement for recommended care.  Comment: Rehab Admissions Coordinator to follow up.  Maryla MorrowAnkit Wavie Hashimi, MD, [address]FAAPMR Love, Pamela S, [place]PA-C 12/28/2016

## 2016-12-28 NOTE — Progress Notes (Signed)
  Echocardiogram 2D Echocardiogram has been performed.  Delcie RochENNINGTON, Debrah Granderson 12/28/2016, 8:45 AM

## 2016-12-28 NOTE — Progress Notes (Signed)
PROGRESS NOTE        PATIENT DETAILS Name: [name] Weber Age: 83 y.o. Sex: female Date of Birth: 06/08/1931 Admit Date: 12/26/2016 Admitting Physician Pete Glatterawn T Langeland, MD ZOX:[id number]:[name],[name], MD  Brief Narrative: Patient is a 83 y.o. female with a h/o Afib, and epidural hemorrhage  (no longer on anticoagulants). She reported to ED on 12/26/16 after a fall/syncope incident where she found herself on the bathroom floor. Patient cannot fully recall the details of the  incident. Abdominal and pelvic CT showed right-sided pelvic fractures including a comminuted impacted fx of the right inferior pubic ramus, possible avulsion fx of right anterior sacrum, and displaced fx of right ischium and ilium extending to the SI joint. Patient was admitted for further management and consultation with surgical service.   Subjective:  Patient appears in pleasant mood accompanied by family members. Pain appears well controlled.   Assessment/Plan: Fall/Syncope Incident: Possible etiology mechanical fall and/or atrial fibrillation syncope. Echocardiogram pending. EKG on 12/26/16 showed rate controlled atrial fibrillation.   Multiple closed fractures of pelvis with stable disruption of pelvic circle: Abdominal and pelvic CT showed right-sided pelvic fractures including a comminuted impacted fx of the right inferior pubic ramus, possible avulsion fx of right anterior sacrum, and displaced fx of right ischium and ilium extending to the SI joint. Continue pain management. Consult with surgery.   Atrial Fibrillation: EKG on 12/26/16 showed atrial fibrillation with rate control due to digoxin. CHA2DS2-Vas score of at least 2. Prior anticoagulation was d/c due to epidural hematoma. Continue daily ASA and heparin. Today's echocardiogram results pending.   Essential Hypertension: Controlled without any medications. Continue vitals daily.   HLD (hyperlipidemia): Managed at home via  supplements of tumeric and fish oil. Continue outpatient management.   DVT Prophylaxis: Heparin  Code Status: Full code  Family Communication: Multiple family members at bedside   Disposition Plan: Remain inpatient  Antimicrobial agents: Anti-infectives    None      Procedures: Echocardiogram   CONSULTS: Ortho Consult   Time spent: 25 minutes-Greater than 50% of this time was spent in counseling, explanation of diagnosis, planning of further management, and coordination of care.  MEDICATIONS: Scheduled Meds: . acetaminophen  1,000 mg Oral Once  . aspirin EC  81 mg Oral Daily  . digoxin  125 mcg Oral Daily  . heparin  5,000 Units Subcutaneous Q8H  . latanoprost  1 drop Both Eyes QHS  . omega-3 acid ethyl esters  1 g Oral Daily   Continuous Infusions: PRN Meds:.acetaminophen **OR** acetaminophen, alum & mag hydroxide-simeth, HYDROcodone-acetaminophen, morphine injection, ondansetron **OR** ondansetron (ZOFRAN) IV, polyethylene glycol, traZODone   PHYSICAL EXAM: Vital signs: Vitals:   12/27/16 0957 12/27/16 1446 12/27/16 2023 12/28/16 0619  BP:  125/63 113/62 (!) 150/78  Pulse: 85 84 73 80  Resp:   16 16  Temp:  98 F (36.7 C) 98.7 F (37.1 C) 98.9 F (37.2 C)  TempSrc:  Oral Oral Oral  SpO2:  95% 95% 97%  Weight:    54.9 kg (121 lb 0.5 oz)  Height:       Filed Weights   12/26/16 0753 12/27/16 0610 12/28/16 0619  Weight: 49.4 kg (109 lb) 52.2 kg (115 lb 1.3 oz) 54.9 kg (121 lb 0.5 oz)   Body mass index is 19.54 kg/m.   General appearance: Awake, alert, not in any  distress. Speech Clear. Not toxic Looking Eyes: PERRLA, no scleral icterus. Pink conjunctiva HEENT: Atraumatic and Normocephalic Neck: Supple, no JVD. No cervical lymphadenopathy. No thyromegaly. CVS: S1 S2 regular, no murmurs.  GI: Bowel sounds present, Non tender and not distended with no gaurding, rigidity or rebound. No organomegaly. Extremities: B/L Lower Ext shows no edema, both legs  are warm to touch Neurology: Speech clear,Non focal, sensation is grossly intact. Psychiatric: Normal judgment and insight. AAO x 3. Normal mood. Musculoskeletal:No digital cyanosis Skin:No Rash, warm and dry  I have personally reviewed following labs and imaging studies  LABORATORY DATA: CBC:  Recent Labs Lab 12/26/16 0810 12/26/16 0918 12/27/16 0357 12/28/16 0704  WBC 11.4*  --  7.5 7.5  NEUTROABS 9.7*  --   --   --   HGB 13.7 14.6 12.9 13.7  HCT 41.7 43.0 40.4 42.3  MCV 98.3  --  99.3 99.5  PLT 157  --  156 146*    Basic Metabolic Panel:  Recent Labs Lab 12/26/16 0810 12/26/16 0918 12/27/16 0357  NA 140 141 138  K 4.1 4.1 4.0  CL 101 101 104  CO2 27  --  28  GLUCOSE 123* 124* 106*  BUN 21* 25* 19  CREATININE 0.57 0.50 0.61  CALCIUM 9.2  --  8.7*    GFR: Estimated Creatinine Clearance: 46.2 mL/min (by C-G formula based on SCr of 0.61 mg/dL).  Liver Function Tests:  Recent Labs Lab 12/27/16 0357  AST 28  ALT 18  ALKPHOS 50  BILITOT 0.4  PROT 5.9*  ALBUMIN 3.2*   No results for input(s): LIPASE, AMYLASE in the last 168 hours. No results for input(s): AMMONIA in the last 168 hours.  Coagulation Profile:  Recent Labs Lab 12/26/16 0810 12/27/16 0357  INR 1.05 1.15    Cardiac Enzymes: No results for input(s): CKTOTAL, CKMB, CKMBINDEX, TROPONINI in the last 168 hours.  BNP (last 3 results) No results for input(s): PROBNP in the last 8760 hours.  HbA1C: No results for input(s): HGBA1C in the last 72 hours.  CBG: No results for input(s): GLUCAP in the last 168 hours.  Lipid Profile: No results for input(s): CHOL, HDL, LDLCALC, TRIG, CHOLHDL, LDLDIRECT in the last 72 hours.  Thyroid Function Tests: No results for input(s): TSH, T4TOTAL, FREET4, T3FREE, THYROIDAB in the last 72 hours.  Anemia Panel: No results for input(s): VITAMINB12, FOLATE, FERRITIN, TIBC, IRON, RETICCTPCT in the last 72 hours.  Urine analysis:    Component Value  Date/Time   COLORURINE YELLOW 12/26/2016 1204   APPEARANCEUR CLEAR 12/26/2016 1204   LABSPEC 1.018 12/26/2016 1204   PHURINE 7.0 12/26/2016 1204   GLUCOSEU NEGATIVE 12/26/2016 1204   HGBUR NEGATIVE 12/26/2016 1204   BILIRUBINUR NEGATIVE 12/26/2016 1204   KETONESUR NEGATIVE 12/26/2016 1204   PROTEINUR NEGATIVE 12/26/2016 1204   UROBILINOGEN 0.2 01/23/2015 0959   NITRITE NEGATIVE 12/26/2016 1204   LEUKOCYTESUR NEGATIVE 12/26/2016 1204    Sepsis Labs: Lactic Acid, Venous No results found for: LATICACIDVEN  MICROBIOLOGY: Recent Results (from the past 240 hour(s))  Surgical pcr screen     Status: None   Collection Time: 12/28/16  6:51 AM  Result Value Ref Range Status   MRSA, PCR NEGATIVE NEGATIVE Final   Staphylococcus aureus NEGATIVE NEGATIVE Final    Comment:        The Xpert SA Assay (FDA approved for NASAL specimens in patients over 2 years of age), is one component of a comprehensive surveillance program.  Test performance has  been validated by [facility] for patients greater than or equal to 16 year old. It is not intended to diagnose infection nor to guide or monitor treatment.     RADIOLOGY STUDIES/RESULTS: Dg Chest 1 View  Result Date: 12/26/2016 CLINICAL DATA:  Fall EXAM: CHEST 1 VIEW COMPARISON:  11/13/2014 FINDINGS: Mild cardiomegaly. There is hyperinflation of the lungs compatible with COPD. No confluent opacities or effusions. No acute bony abnormality. IMPRESSION: COPD.  Cardiomegaly.  No active disease. Electronically Signed   By: Charlett Nose M.D.   On: 12/26/2016 09:01   Ct Pelvis Wo Contrast  Result Date: 12/26/2016 CLINICAL DATA:  Status post fall. EXAM: CT PELVIS WITHOUT CONTRAST TECHNIQUE: Multidetector CT imaging of the pelvis was performed following the standard protocol without intravenous contrast. COMPARISON:  None. FINDINGS: Urinary Tract: No renal abnormality visualized. The urinary bladder is significantly distended. Bowel: Diffuse left  colonic diverticulosis without evidence of diverticulitis on this nonenhanced exam. Vascular/Lymphatic: No pathologically enlarged lymph nodes. Advanced calcific atherosclerotic disease of the aorta and its branches. IVC filter is in place. Reproductive:  Post hysterectomy. Other:  None. Musculoskeletal: There is a comminuted impacted fracture of the right inferior pubic ramus with buckle 0.8 similarly comminuted fracture of the superior pubic ramus on the right. There is a minimally displaced fracture of the right ischium and ilium extending to the SI joint. There is a questionable tiny avulsion fracture of the right anterior sacrum at the anterior portion of the right sacroiliac joint. There is generalized advanced osteopenia. No other fractures are seen. There are severe osteoarthritic changes of the visualized portion of the lumbosacral spine with likely degenerative grade 1 anterolisthesis of L5 on S1. IMPRESSION: Significant distension of the urinary bladder. Please correlate clinically. Advanced calcific atherosclerotic disease of the aorta. Advanced osteopenia. Right-sided pelvic fractures to include comminuted impacted fractures of the right inferior and superior pubic rami, minimally displaced fractures of the right ischium and ileum extending to the SI joint, and probable avulsion fractures of the right sacrum at the inferior SI joint. Severe osteoarthritic changes of the visualized portion of the lumbosacral spine with grade 1 anterolisthesis of L5 on S1. Electronically Signed   By: Ted Mcalpine M.D.   On: 12/26/2016 10:41   Dg Hip Unilat With Pelvis 2-3 Views Right  Result Date: 12/26/2016 CLINICAL DATA:  Right groin pain. EXAM: DG HIP (WITH OR WITHOUT PELVIS) 2-3V RIGHT COMPARISON:  Lumbar spine 08/10/2016 FINDINGS: New displaced fractures involving the right inferior and superior pubic rami. Possible fractures involving the right side of the sacrum demonstrated by disruption of the sacral  arcuate lines. Atherosclerotic calcifications in the iliac arteries. The right hip is located without a fracture. IMPRESSION: Displaced right pubic rami fractures.  Possible sacral fractures. Electronically Signed   By: Richarda Overlie M.D.   On: 12/26/2016 09:02     LOS: 2 days   Rose Clousing, PAS  [employer]  If 7PM-7AM, please contact night-coverage [email or web address] Password [facility] 12/28/2016, 9:34 AM  Attending MD note  Patient was seen, examined,treatment plan was discussed with the PA-S.  I have personally reviewed the clinical findings, lab, imaging studies and management of this patient in detail. I agree with the documentation, as recorded by the PA-S.   Patient doing well-denies any complaints when she is lying still. Claims pain is only on movement.  On Exam: Gen. exam: Awake, alert, not in any distress Chest: Good air entry bilaterally, no rhonchi or rales CVS: S1-S2 regular, no murmurs Abdomen:  Soft, nontender and nondistended Neurology: Non-focal Skin: No rash or lesions  Impression: Multiple pelvic fractures-orthopedics contemplating surgical repair Syncope versus mechanical fall Rate controlled atrial fibrillation-no longer on anticoagulation  Plan: Await 2-D echocardiogram, continue telemetry monitoring Await formal evaluation by Dr. Dawna Part timing of surgical procedure Will likely require SNF on discharge   Rest as above  [facility]

## 2016-12-28 NOTE — Consult Note (Signed)
Orthopaedic Trauma Service (OTS) Consult   Reason for Consult: pelvic ring fracture  Referring Physician: Geralynn Rile, MD   HPI: [name] Weber is an 83 y.o. white female with hx of Afib, walker dependence, spinal bleed due to anticoagulation and lumbar spine fractures, B LEx weakness/dysfunction (wears AFOs) who sustained a ground level fall on 12/26/2016 while ambulating to the bathroom. Pt really does not recall how she fell. Pt was brought to [facility] hospital and admitted to the medical service. She was found to have a pelvic ring fracture. Pt was seen and evaluated by orthopaedics. Due to the nature of her injury it was felt that the pts injury should be evaluated and treated by an orthopaedic trauma fellowship trained surgeon. As such the orthopaedic trauma service was consulted for definitve management   Pt has been on bedrest since admission   She does report low back pain that is new in nature   Pt does have a history of osteoporosis. She currently takes vitamin d and calcium   Last dexa scan notable for T-score of -3.1 in spine and -2.5 in femur   Did sustain multiple lumbar fractures July of 2017 for which she is still recovering from. Sounds as if surgery was a potential option but there was concern for HW failure due to osteoporosis (family report)   Pt no longer on chronic anticoagulation other than baby aspirin    PCP: Dr. Inda Merlin  Past Medical History:  Diagnosis Date  . Fatigue   . Malaise   . Myalgia   . PAC (premature atrial contraction)    ISOLATED  . Palpitations    OCCASSIONAL  . Paroxysmal atrial flutter ([place])   . PVC's (premature ventricular contractions)    ISOLATED    Past Surgical History:  Procedure Laterality Date  . THORACIC LAMINECTOMY FOR EPIDURAL ABSCESS N/A 11/16/2014   Procedure: THORACIC LAMINECTOMY FOR EPIDURAL ABSCESS;  Surgeon: Floyce Stakes, MD;  Location: [facility] NEURO ORS;  Service: Neurosurgery;  Laterality: N/A;  . VULVAR LESION REMOVAL   01/01/2009   She had a 1-cm area of erythema to the right of the urethral meatus    Family History  Problem Relation Age of Onset  . Hyperlipidemia Mother     Social History:  reports that she quit smoking about 25 years ago. She has quit using smokeless tobacco. She reports that she does not drink alcohol or use drugs.  Allergies:  Allergies  Allergen Reactions  . Crestor [Rosuvastatin Calcium] Other (See Comments)    "bones ache all over"  . Rosuvastatin Other (See Comments)    "bones ache all over" Bone pain all over body    Medications:  Prior to Admission:  Prescriptions Prior to Admission  Medication Sig Dispense Refill Last Dose  . aspirin EC 81 MG EC tablet Take 1 tablet (81 mg total) by mouth daily.   12/25/2016 at Unknown time  . Calcium Carb-Cholecalciferol (CALCIUM 600 + D PO) Take 1 tablet by mouth daily.    12/25/2016 at Unknown time  . digoxin (LANOXIN) 0.125 MG tablet TAKE 1 TABLET BY MOUTH DAILY 90 tablet 3 12/25/2016 at Unknown time  . latanoprost (XALATAN) 0.005 % ophthalmic solution Place 1 drop into both eyes at bedtime.   12/25/2016 at Unknown time  . Magnesium 400 MG CAPS Take 400 mg by mouth daily.    12/25/2016 at Unknown time  . Omega-3 Fatty Acids (FISH OIL ULTRA) 1400 MG CAPS Take 1,400 mg by mouth daily.  12/25/2016 at Unknown time  . Turmeric 500 MG CAPS Take 500 mg by mouth daily.    12/25/2016 at Unknown time    Results for orders placed or performed during the hospital encounter of 12/26/16 (from the past 48 hour(s))  Urinalysis, Routine w reflex microscopic     Status: None   Collection Time: 12/26/16 12:04 PM  Result Value Ref Range   Color, Urine YELLOW YELLOW   APPearance CLEAR CLEAR   Specific Gravity, Urine 1.018 1.005 - 1.030   pH 7.0 5.0 - 8.0   Glucose, UA NEGATIVE NEGATIVE mg/dL   Hgb urine dipstick NEGATIVE NEGATIVE   Bilirubin Urine NEGATIVE NEGATIVE   Ketones, ur NEGATIVE NEGATIVE mg/dL   Protein, ur NEGATIVE NEGATIVE mg/dL    Nitrite NEGATIVE NEGATIVE   Leukocytes, UA NEGATIVE NEGATIVE  Digoxin level     Status: Abnormal   Collection Time: 12/26/16  2:13 PM  Result Value Ref Range   Digoxin Level 0.6 (L) 0.8 - 2.0 ng/mL  Comprehensive metabolic panel     Status: Abnormal   Collection Time: 12/27/16  3:57 AM  Result Value Ref Range   Sodium 138 135 - 145 mmol/L   Potassium 4.0 3.5 - 5.1 mmol/L   Chloride 104 101 - 111 mmol/L   CO2 28 22 - 32 mmol/L   Glucose, Bld 106 (H) 65 - 99 mg/dL   BUN 19 6 - 20 mg/dL   Creatinine, Ser 0.61 0.44 - 1.00 mg/dL   Calcium 8.7 (L) 8.9 - 10.3 mg/dL   Total Protein 5.9 (L) 6.5 - 8.1 g/dL   Albumin 3.2 (L) 3.5 - 5.0 g/dL   AST 28 15 - 41 U/L   ALT 18 14 - 54 U/L   Alkaline Phosphatase 50 38 - 126 U/L   Total Bilirubin 0.4 0.3 - 1.2 mg/dL   GFR calc non Af Amer >60 >60 mL/min   GFR calc Af Amer >60 >60 mL/min    Comment: (NOTE) The eGFR has been calculated using the CKD EPI equation. This calculation has not been validated in all clinical situations. eGFR's persistently <60 mL/min signify possible Chronic Kidney Disease.    Anion gap 6 5 - 15  CBC     Status: None   Collection Time: 12/27/16  3:57 AM  Result Value Ref Range   WBC 7.5 4.0 - 10.5 K/uL   RBC 4.07 3.87 - 5.11 MIL/uL   Hemoglobin 12.9 12.0 - 15.0 g/dL   HCT 40.4 36.0 - 46.0 %   MCV 99.3 78.0 - 100.0 fL   MCH 31.7 26.0 - 34.0 pg   MCHC 31.9 30.0 - 36.0 g/dL   RDW 12.6 11.5 - 15.5 %   Platelets 156 150 - 400 K/uL  Protime-INR     Status: None   Collection Time: 12/27/16  3:57 AM  Result Value Ref Range   Prothrombin Time 14.8 11.4 - 15.2 seconds   INR 1.15   Surgical pcr screen     Status: None   Collection Time: 12/28/16  6:51 AM  Result Value Ref Range   MRSA, PCR NEGATIVE NEGATIVE   Staphylococcus aureus NEGATIVE NEGATIVE    Comment:        The Xpert SA Assay (FDA approved for NASAL specimens in patients over 59 years of age), is one component of a comprehensive surveillance program.   Test performance has been validated by [facility] for patients greater than or equal to 75 year old. It is not intended  to diagnose infection nor to guide or monitor treatment.   CBC     Status: Abnormal   Collection Time: 12/28/16  7:04 AM  Result Value Ref Range   WBC 7.5 4.0 - 10.5 K/uL   RBC 4.25 3.87 - 5.11 MIL/uL   Hemoglobin 13.7 12.0 - 15.0 g/dL   HCT 42.3 36.0 - 46.0 %   MCV 99.5 78.0 - 100.0 fL   MCH 32.2 26.0 - 34.0 pg   MCHC 32.4 30.0 - 36.0 g/dL   RDW 12.3 11.5 - 15.5 %   Platelets 146 (L) 150 - 400 K/uL    Ct Pelvis Wo Contrast  Result Date: 12/26/2016 CLINICAL DATA:  Status post fall. EXAM: CT PELVIS WITHOUT CONTRAST TECHNIQUE: Multidetector CT imaging of the pelvis was performed following the standard protocol without intravenous contrast. COMPARISON:  None. FINDINGS: Urinary Tract: No renal abnormality visualized. The urinary bladder is significantly distended. Bowel: Diffuse left colonic diverticulosis without evidence of diverticulitis on this nonenhanced exam. Vascular/Lymphatic: No pathologically enlarged lymph nodes. Advanced calcific atherosclerotic disease of the aorta and its branches. IVC filter is in place. Reproductive:  Post hysterectomy. Other:  None. Musculoskeletal: There is a comminuted impacted fracture of the right inferior pubic ramus with buckle 0.8 similarly comminuted fracture of the superior pubic ramus on the right. There is a minimally displaced fracture of the right ischium and ilium extending to the SI joint. There is a questionable tiny avulsion fracture of the right anterior sacrum at the anterior portion of the right sacroiliac joint. There is generalized advanced osteopenia. No other fractures are seen. There are severe osteoarthritic changes of the visualized portion of the lumbosacral spine with likely degenerative grade 1 anterolisthesis of L5 on S1. IMPRESSION: Significant distension of the urinary bladder. Please correlate clinically.  Advanced calcific atherosclerotic disease of the aorta. Advanced osteopenia. Right-sided pelvic fractures to include comminuted impacted fractures of the right inferior and superior pubic rami, minimally displaced fractures of the right ischium and ileum extending to the SI joint, and probable avulsion fractures of the right sacrum at the inferior SI joint. Severe osteoarthritic changes of the visualized portion of the lumbosacral spine with grade 1 anterolisthesis of L5 on S1. Electronically Signed   By: Fidela Salisbury M.D.   On: 12/26/2016 10:41    ROS  As above   Blood pressure (!) 150/78, pulse 80, temperature 98.9 F (37.2 C), temperature source Oral, resp. rate 16, height _0  (1.676 m), weight 54.9 kg (121 lb 0.5 oz), SpO2 97 %. Physical Exam  Constitutional: She is cooperative.  Elderly appearing white female NAD   Cardiovascular: An irregularly irregular rhythm present.  Pulmonary/Chest:  Clear anterior fields   Abdominal:  Soft, +BS  Musculoskeletal:  Pelvis    No open wounds or lesions    No pain with palpation of pubic symphysis     + pain to R hemipelvis with lateral compression but no gross motion appreciated   Bilateral lower Extremities  Inspection:   No open wounds    B lower legs with PVD type skin changes    Hallux valgus deformity to L foot   Bony eval:    Feet, ankles, lower legs, knees and thighs are nontender    No crepitus or gross motion with manipulation   Soft tissue:    No appreciable swelling to B LEx     Skin changes as noted above     Moderate B LEx atrophy   ROM/Motor:  Pt with severely restricted ankle extension. About -5 degrees of extension      Ankle flexion to about 20 degrees bilaterally      Active inversion ok B      No active ankle extension or eversion B      Weak EHL B       Toe flexion intact      Unable to perform SLR on well side       Sensation:     DPN, SPN, TN sensation grossly intact and symmetric by pts  report   Vascular:    Ext warm     + DP pulses B      Psychiatric: She has a normal mood and affect.     Assessment/Plan:  83 y/o female s/p ground level fall with LC2 pelvic ring fracture   - R LC 2 pelvic ring fracture, baseline B foot drop/LEx weakness due to spinal bleed   Given entire clinical picture we will proceed with non-op management  Do think there is enough stability present to allow for non-op tx     Allow pt to be WBAT on R leg with continued use of her walker and AFOs  PT/OT evla  No ROM restrictions     Baseline pelvic flims, repeat films in 2 days or so    Could consider SI screws if pain too severe but concerned that fixation/purchase of screw will be suboptimal in this pt given her significant osteoporosis    Pt must mobilize with her AFOs   Family does confirm that she has significant B LEx weakness and has difficulty performing hip flexion to clear her legs while walking  Clinically her contractures are fairly significant   ? If her ankle contractures and LE weakness caused her to fall as it would appear to me that she has a hard time with normal gait mechanics   ? If candidate for HKFO?    - Pain management:  Tylenol  Low dose norco for severe pain   - ABL anemia/Hemodynamics  Stable  - Medical issues   Per medical service     Osteoporosis   Would recommend repeat dexa as it has been 2 years since last scan   Given hx of lumbar fractures and this pelvic fracture, along with previous dexa, pt would likely benefit from pharmacologic treatment of her osteoporosis   Pt has a PCP that is actively following, defer to PCP  - DVT/PE prophylaxis:  Per medical service   - Metabolic Bone Disease:  Check vitamin D levels  - Activity:  PT/OT evals  CIR consult   - FEN/GI prophylaxis/Foley/Lines:  Ok to eat from Ortho standpoint  No OR today or foreseeable future  - Impediments to fracture healing:  Limited mobility   Osteoporosis  -  Dispo:  PT/OT evals    Jari Pigg, PA-C [facility] [phone] (P) 12/28/2016, 10:13 AM

## 2016-12-29 ENCOUNTER — Ambulatory Visit: Payer: Medicare Other | Admitting: Physical Therapy

## 2016-12-29 NOTE — Clinical Social Work Note (Signed)
Clinical Social Work Assessment  Patient Details  Name: [name] Weber MRN: [id number] Date of Birth: 03/07/1931  Date of referral:  12/29/16               Reason for consult:  Facility Placement                Permission sought to share information with:  Family Supports Permission granted to share information::  Yes, Verbal Permission Granted  Name::     Via  Agency::     Relationship::  POA  Contact Information:  [phone]  Housing/Transportation Living arrangements for the past 2 months:  Skilled Nursing Facility Source of Information:  Patient, Power of Attorney Patient Interpreter Needed:  None Criminal Activity/Legal Involvement Pertinent to Current Situation/Hospitalization:  No - Comment as needed Significant Relationships:  Adult Children Lives with:  Self Do you feel safe going back to the place where you live?    Need for family participation in patient care:     Care giving concerns:  Pt's POA's were present at bedside during initial assessment.    Social Worker assessment / plan:  CSW spoke with pt at bedside to complete initial assessment. Pt lives home alone. Pt has two POA and they are both friends. When CSW was asking the pt where she would like to go for SNF she stated " ill go wherever they say, I have to, they are my POA." Pt is alert and oriented. CSW explains the need to fax out pt for SNF because per inpatient coordinator she does not believe insurance will approve pt for inpatient rehab. Pt has been to [facility] previously and the POA's appealed the insurance denials for CIR, three times. Pt's POA states "we will appeal again." RNCM notified CIR coordinator to come speak with the POA. POA agreed for CSW to fax referral to [facility] and [facility].  Employment status:  Retired Database administratornsurance information:  Managed Medicare PT Recommendations:  Skilled Nursing Facility Information / Referral to community resources:  Skilled Nursing Facility  Patient/Family's  Response to care:  Pt did not not speak much. Pt verbalized understanding of CSW role. Pt's POA did most of the speaking.  Patient/Family's Understanding of and Emotional Response to Diagnosis, Current Treatment, and Prognosis:  Pt's POA is understanding of pt's physical limitations and understands pt can not go home as this would not be a safe plan. Pt's POA wants pt to go to inpatient rehab and is determined to appeal any denials. CSW will continue to follow.  Emotional Assessment Appearance:  Appears stated age Attitude/Demeanor/Rapport:   (Patient was appropriate.) Affect (typically observed):  Accepting, Appropriate, Calm Orientation:  Oriented to Self, Oriented to Place, Oriented to  Time, Oriented to Situation Alcohol / Substance use:  Not Applicable Psych involvement (Current and /or in the community):  No (Comment)  Discharge Needs  Concerns to be addressed:  No discharge needs identified Readmission within the last 30 days:  No Current discharge risk:  Dependent with Mobility Barriers to Discharge:  Continued Medical Work up   [employer] IncBridget A Mayton, LCSW 12/29/2016, 2:07 PM

## 2016-12-29 NOTE — Progress Notes (Signed)
Rehab admissions - I met with Via and Letta Median, sisters, who are friends and HCPOA for patient.  They very much want inpatient rehab.  Patient has been to inpatient rehab twice before and did very well.  Her HCPOA attend to appeal the insurance decision should we receive a denial.  I will not hear back from insurance case manager today.  I will follow up in am with insurance case manager and patient and HCPOA with decision.  Patient does understand that she will be discharged when she is medically ready for discharge whether or not we have an answer from insurance carrier.  Call me for questions.  #[phone]

## 2016-12-29 NOTE — Progress Notes (Signed)
PROGRESS NOTE        PATIENT DETAILS Name: [name] Weber Age: 83 y.o. Sex: female Date of Birth: 1931-04-23 Admit Date: 12/26/2016 Admitting Physician Pete Glatter, MD RUE:AVWUJ,WJXBJY NEVILL, MD  Brief Narrative: Patient is a 83 y.o. female with past medical history of atrial fibrillation no longer an anticoagulation (prior history of epidural hemorrhage) who sustained a mechanical fall/syncope and was found to have pelvic fracture. Seen by orthopedics, initially surgical repair was contemplated, but after evaluation by trauma orthopedics-commended lesions were to pursue nonoperative treatment.  Subjective: Lying comfortably in bed-pain appears to be well controlled.  Assessment/Plan: Pelvic fracture: Secondary to fall/syncope, orthopedics initially felt that surgical repair was appropriate, but upon reevaluation by trauma orthopedics-nonoperative management has been recommended. Seen by physical therapy-recommendations are to pursue CIR eval. Stable for discharge if CIR bed is available.  Mechanical fall vs syncope: Patient not entirely sure whether she lost consciousness, EKG/telemetry shows atrial fibrillation. Echocardiogram shows preserved ejection fraction. Doubt any further workup required.   Chronic atrial fibrillation: Rate controlled, continue digoxin. No longer on anticoagulation given prior history of epidural hematoma. Continue aspirin  History of hypertension: Blood pressure currently controlled without the use of any antihypertensives. Follow  DVT Prophylaxis: Prophylactic Heparin   Code Status: Full code   Family Communication: Son at bedside   Disposition Plan: Remain inpatient-but will plan SNF versus CIR when bed available-hopefully on 1/31  Antimicrobial agents: Anti-infectives    None     Procedures: None  CONSULTS:  orthopedic surgery  Time spent: 25 minutes-Greater than 50% of this time was spent in counseling,  explanation of diagnosis, planning of further management, and coordination of care.  MEDICATIONS: Scheduled Meds: . acetaminophen  1,000 mg Oral Once  . aspirin EC  81 mg Oral Daily  . digoxin  125 mcg Oral Daily  . heparin  5,000 Units Subcutaneous Q8H  . latanoprost  1 drop Both Eyes QHS  . omega-3 acid ethyl esters  1 g Oral Daily   Continuous Infusions: PRN Meds:.acetaminophen **OR** acetaminophen, alum & mag hydroxide-simeth, HYDROcodone-acetaminophen, morphine injection, ondansetron **OR** ondansetron (ZOFRAN) IV, polyethylene glycol, traZODone   PHYSICAL EXAM: Vital signs: Vitals:   12/28/16 0619 12/28/16 1409 12/28/16 2037 12/29/16 0603  BP: (!) 150/78 134/61 127/63 133/84  Pulse: 80 74  87  Resp: 16 16 16 16   Temp: 98.9 F (37.2 C) 98.9 F (37.2 C) 99.1 F (37.3 C) 98.3 F (36.8 C)  TempSrc: Oral Oral Oral Oral  SpO2: 97% 97% 97% 97%  Weight: 54.9 kg (121 lb 0.5 oz)     Height:       Filed Weights   12/26/16 0753 12/27/16 0610 12/28/16 0619  Weight: 49.4 kg (109 lb) 52.2 kg (115 lb 1.3 oz) 54.9 kg (121 lb 0.5 oz)   Body mass index is 19.54 kg/m.   General appearance :Awake, alert, not in any distress. Speech Clear.  Eyes:, pupils equally reactive to light and accomodation,no scleral icterus.Pink conjunctiva HEENT: Atraumatic and Normocephalic Neck: supple, no JVD. No cervical lymphadenopathy. No thyromegaly Resp:Good air entry bilaterally, no added sounds  CVS: S1 S2 irregular GI: Bowel sounds present, Non tender and not distended with no gaurding, rigidity or rebound.No organomegaly Extremities: B/L Lower Ext shows no edema, both legs are warm to touch Neurology:  speech clear,Non focal, sensation is grossly intact. Psychiatric: Normal  judgment and insight. Alert and oriented x 3. Normal mood. Musculoskeletal:No digital cyanosis Skin:No Rash, warm and dry Wounds:N/A  I have personally reviewed following labs and imaging studies  LABORATORY  DATA: CBC:  Recent Labs Lab 12/26/16 0810 12/26/16 0918 12/27/16 0357 12/28/16 0704  WBC 11.4*  --  7.5 7.5  NEUTROABS 9.7*  --   --   --   HGB 13.7 14.6 12.9 13.7  HCT 41.7 43.0 40.4 42.3  MCV 98.3  --  99.3 99.5  PLT 157  --  156 146*    Basic Metabolic Panel:  Recent Labs Lab 12/26/16 0810 12/26/16 0918 12/27/16 0357  NA 140 141 138  K 4.1 4.1 4.0  CL 101 101 104  CO2 27  --  28  GLUCOSE 123* 124* 106*  BUN 21* 25* 19  CREATININE 0.57 0.50 0.61  CALCIUM 9.2  --  8.7*    GFR: Estimated Creatinine Clearance: 46.2 mL/min (by C-G formula based on SCr of 0.61 mg/dL).  Liver Function Tests:  Recent Labs Lab 12/27/16 0357  AST 28  ALT 18  ALKPHOS 50  BILITOT 0.4  PROT 5.9*  ALBUMIN 3.2*   No results for input(s): LIPASE, AMYLASE in the last 168 hours. No results for input(s): AMMONIA in the last 168 hours.  Coagulation Profile:  Recent Labs Lab 12/26/16 0810 12/27/16 0357  INR 1.05 1.15    Cardiac Enzymes: No results for input(s): CKTOTAL, CKMB, CKMBINDEX, TROPONINI in the last 168 hours.  BNP (last 3 results) No results for input(s): PROBNP in the last 8760 hours.  HbA1C: No results for input(s): HGBA1C in the last 72 hours.  CBG: No results for input(s): GLUCAP in the last 168 hours.  Lipid Profile: No results for input(s): CHOL, HDL, LDLCALC, TRIG, CHOLHDL, LDLDIRECT in the last 72 hours.  Thyroid Function Tests: No results for input(s): TSH, T4TOTAL, FREET4, T3FREE, THYROIDAB in the last 72 hours.  Anemia Panel: No results for input(s): VITAMINB12, FOLATE, FERRITIN, TIBC, IRON, RETICCTPCT in the last 72 hours.  Urine analysis:    Component Value Date/Time   COLORURINE YELLOW 12/26/2016 1204   APPEARANCEUR CLEAR 12/26/2016 1204   LABSPEC 1.018 12/26/2016 1204   PHURINE 7.0 12/26/2016 1204   GLUCOSEU NEGATIVE 12/26/2016 1204   HGBUR NEGATIVE 12/26/2016 1204   BILIRUBINUR NEGATIVE 12/26/2016 1204   KETONESUR NEGATIVE 12/26/2016  1204   PROTEINUR NEGATIVE 12/26/2016 1204   UROBILINOGEN 0.2 01/23/2015 0959   NITRITE NEGATIVE 12/26/2016 1204   LEUKOCYTESUR NEGATIVE 12/26/2016 1204    Sepsis Labs: Lactic Acid, Venous No results found for: LATICACIDVEN  MICROBIOLOGY: Recent Results (from the past 240 hour(s))  Surgical pcr screen     Status: None   Collection Time: 12/28/16  6:51 AM  Result Value Ref Range Status   MRSA, PCR NEGATIVE NEGATIVE Final   Staphylococcus aureus NEGATIVE NEGATIVE Final    Comment:        The Xpert SA Assay (FDA approved for NASAL specimens in patients over 81 years of age), is one component of a comprehensive surveillance program.  Test performance has been validated by [facility] for patients greater than or equal to 81 year old. It is not intended to diagnose infection nor to guide or monitor treatment.     RADIOLOGY STUDIES/RESULTS: Dg Chest 1 View  Result Date: 12/26/2016 CLINICAL DATA:  Fall EXAM: CHEST 1 VIEW COMPARISON:  11/13/2014 FINDINGS: Mild cardiomegaly. There is hyperinflation of the lungs compatible with COPD. No confluent opacities or effusions.  No acute bony abnormality. IMPRESSION: COPD.  Cardiomegaly.  No active disease. Electronically Signed   By: Charlett Nose M.D.   On: 12/26/2016 09:01   Ct Pelvis Wo Contrast  Result Date: 12/26/2016 CLINICAL DATA:  Status post fall. EXAM: CT PELVIS WITHOUT CONTRAST TECHNIQUE: Multidetector CT imaging of the pelvis was performed following the standard protocol without intravenous contrast. COMPARISON:  None. FINDINGS: Urinary Tract: No renal abnormality visualized. The urinary bladder is significantly distended. Bowel: Diffuse left colonic diverticulosis without evidence of diverticulitis on this nonenhanced exam. Vascular/Lymphatic: No pathologically enlarged lymph nodes. Advanced calcific atherosclerotic disease of the aorta and its branches. IVC filter is in place. Reproductive:  Post hysterectomy. Other:  None.  Musculoskeletal: There is a comminuted impacted fracture of the right inferior pubic ramus with buckle 0.8 similarly comminuted fracture of the superior pubic ramus on the right. There is a minimally displaced fracture of the right ischium and ilium extending to the SI joint. There is a questionable tiny avulsion fracture of the right anterior sacrum at the anterior portion of the right sacroiliac joint. There is generalized advanced osteopenia. No other fractures are seen. There are severe osteoarthritic changes of the visualized portion of the lumbosacral spine with likely degenerative grade 1 anterolisthesis of L5 on S1. IMPRESSION: Significant distension of the urinary bladder. Please correlate clinically. Advanced calcific atherosclerotic disease of the aorta. Advanced osteopenia. Right-sided pelvic fractures to include comminuted impacted fractures of the right inferior and superior pubic rami, minimally displaced fractures of the right ischium and ileum extending to the SI joint, and probable avulsion fractures of the right sacrum at the inferior SI joint. Severe osteoarthritic changes of the visualized portion of the lumbosacral spine with grade 1 anterolisthesis of L5 on S1. Electronically Signed   By: Ted Mcalpine M.D.   On: 12/26/2016 10:41   Dg Pelvis Comp Min 3v  Result Date: 12/28/2016 CLINICAL DATA:  Pelvic fracture.  Status post fall in the bathroom. EXAM: JUDET PELVIS - 3+ VIEW COMPARISON:  Pelvic CT 12/26/2016 FINDINGS: Generalized osteopenia. Comminuted fracture of the right superior pubic ramus with 9 mm of inferior displacement of the more medial fragment. Mildly comminuted fracture of the right inferior pubic ramus. Right posterior ilial fracture and right sacral fracture are not well delineated on the x-ray compared with the recent CT performed 12/26/2016. IMPRESSION: 1. Comminuted fracture of the right superior pubic ramus with 9 mm of inferior displacement of the more medial  fragment. Mildly comminuted fracture of the right inferior pubic ramus. Electronically Signed   By: Elige Ko   On: 12/28/2016 14:17   Dg Hip Unilat With Pelvis 2-3 Views Right  Result Date: 12/26/2016 CLINICAL DATA:  Right groin pain. EXAM: DG HIP (WITH OR WITHOUT PELVIS) 2-3V RIGHT COMPARISON:  Lumbar spine 08/10/2016 FINDINGS: New displaced fractures involving the right inferior and superior pubic rami. Possible fractures involving the right side of the sacrum demonstrated by disruption of the sacral arcuate lines. Atherosclerotic calcifications in the iliac arteries. The right hip is located without a fracture. IMPRESSION: Displaced right pubic rami fractures.  Possible sacral fractures. Electronically Signed   By: Richarda Overlie M.D.   On: 12/26/2016 09:02     LOS: 3 days   Jeoffrey Massed, MD  [facility] Pager:[phone]  If 7PM-7AM, please contact night-coverage [email or web address] Password [email or web address] 12/29/2016, 11:50 AM

## 2016-12-29 NOTE — Progress Notes (Signed)
Physical Therapy Treatment Patient Details Name: [name] Policevelyn C Genao MRN: [id number] DOB: 07/07/1931 Today's Date: 12/29/2016    History of Present Illness  [name] Weber is a 83 y.o. female with history of A fib, T-9 paraplegia s/p thoracic laminectomy for abscess (off anticoagulation due to epidural hemorrhage), L 2 compression fracture--CIR stay 05/2016, anxiety disorder, bilateral foot drop managed by AFOs, gait disorder with ongoing outpatient therapy;  who was admitted on 12/26/16 after fall due to syncope and inability to walk. History taken from chart review and patient. She was found to have pelvic ring fracture as well as reports of new low back pain. Dr. Carola FrostHandy consulted and non-surgical management recommended. To be WBAT with B-AFOs and walker.    PT Comments    Seen for second PT session today for more transfer training and assist pt back to bed; stood x2 with stedy; Good participation even in late afternoon; Good use of the stedy for safe transfers as well and it gives [name] Weber more options for transfers to choose from; Continue to recommend comprehensive inpatient rehab (CIR) for post-acute therapy needs.   Follow Up Recommendations  CIR;Supervision/Assistance - 24 hour     Equipment Recommendations  None recommended by PT    Recommendations for Other Services Rehab consult     Precautions / Restrictions Precautions Precautions: Fall Required Braces or Orthoses: Other Brace/Splint Other Brace/Splint: Bil AFOs Restrictions Weight Bearing Restrictions: Yes RLE Weight Bearing: Weight bearing as tolerated LLE Weight Bearing: Weight bearing as tolerated    Mobility  Bed Mobility Overal bed mobility: Needs Assistance Bed Mobility: Sit to Supine     Supine to sit: Mod assist;+2 for physical assistance;HOB elevated Sit to supine: +2 for physical assistance;Mod assist   General bed mobility comments: +2 assist to lay back down; assist for LEs  into bed and to ease shoulders to the bed  Transfers Overall transfer level: Needs assistance Equipment used:  (STEDY) Transfers: Sit to/from Stand Sit to Stand: Mod assist         General transfer comment: Stood x2 -- once from lower shair into stedy, and then from higher stedy seat; mod assist to rise and cues fo rfully upright standing  Ambulation/Gait Ambulation/Gait assistance: Min assist;+2 safety/equipment Ambulation Distance (Feet): 10 Feet Assistive device: Rolling walker (2 wheeled) Gait Pattern/deviations: Step-through pattern;Decreased stance time - right;Decreased step length - left;Decreased step length - right;Decreased weight shift to right;Antalgic     General Gait Details: cues for sequencing of gait with use of AD and vc and assist to weight shift and advance RW    Stairs            Wheelchair Mobility    Modified Rankin (Stroke Patients Only)       Balance Overall balance assessment: Needs assistance;History of Falls Sitting-balance support: No upper extremity supported;Feet supported Sitting balance-Leahy Scale: Fair   Postural control: Posterior lean Standing balance support: Bilateral upper extremity supported;During functional activity Standing balance-Leahy Scale: Poor Standing balance comment: Needed mod assist with RW with pt with significant posterior lean.  Pt needed cues for postural stability.  Pt limited by pain.                     Cognition Arousal/Alertness: Awake/alert Behavior During Therapy: WFL for tasks assessed/performed Overall Cognitive Status: Within Functional Limits for tasks assessed  Exercises      General Comments General comments (skin integrity, edema, etc.): total A to don AFOs      Pertinent Vitals/Pain Pain Assessment: Faces Faces Pain Scale: Hurts little more Pain Location: pelvis and R hip with laying back down into bed Pain Descriptors / Indicators:  Aching;Grimacing;Guarding Pain Intervention(s): Monitored during session    Home Living               Home Equipment: Walker - 4 wheels;Walker - 2 wheels;Toilet riser;Shower seat;Hand held shower head;Grab bars - tub/shower;Adaptive equipment      Prior Function            PT Goals (current goals can now be found in the care plan section) Acute Rehab PT Goals Patient Stated Goal: go to CIR PT Goal Formulation: With patient Time For Goal Achievement: 01/11/17 Potential to Achieve Goals: Good Progress towards PT goals: Progressing toward goals    Frequency    Min 5X/week      PT Plan Current plan remains appropriate    Co-evaluation             End of Session Equipment Utilized During Treatment: Gait belt Activity Tolerance: Patient tolerated treatment well Patient left: in bed;with call bell/phone within reach     Time: 1551-1606 PT Time Calculation (min) (ACUTE ONLY): 15 min  Charges:  $Gait Training: 8-22 mins $Therapeutic Activity: 8-22 mins                    G Codes:      Levi Aland 01/21/17, 4:37 PM  Van Clines, PT  Acute Rehabilitation Services Pager [phone] Office [phone]

## 2016-12-29 NOTE — Evaluation (Signed)
Occupational Therapy Evaluation Patient Details Name: [name] Weber MRN: [id number] DOB: 06/30/1931 Today's Date: 12/29/2016    History of Present Illness  [name] Weber is a 83 y.o. female with history of A fib, T-9 paraplegia s/p thoracic laminectomy for abscess (off anticoagulation due to epidural hemorrhage), L 2 compression fracture--CIR stay 05/2016, anxiety disorder, bilateral foot drop managed by AFOs, gait disorder with ongoing outpatient therapy;  who was admitted on 12/26/16 after fall due to syncope and inability to walk. History taken from chart review and patient. She was found to have pelvic ring fracture as well as reports of new low back pain. Dr. Carola FrostHandy consulted and non-surgical management recommended. To be WBAT with B-AFOs and walker.   Clinical Impression   Pt with decline in function and safety with ADLs and ADL mobility with decreased strength, balance and endurance. Pt is limited by pain and requires extensive assist with LB ADLs and ADL mobility using RW. Pt would benefit from acute OT services to address impairments to increase level of function and safety    Follow Up Recommendations  CIR    Equipment Recommendations  None recommended by OT;Other (comment) (TBD at next venue of care)    Recommendations for Other Services Rehab consult     Precautions / Restrictions Precautions Precautions: Fall Required Braces or Orthoses: Other Brace/Splint Other Brace/Splint: Bil AFOs Restrictions Weight Bearing Restrictions: Yes RLE Weight Bearing: Weight bearing as tolerated LLE Weight Bearing: Weight bearing as tolerated      Mobility Bed Mobility               General bed mobility comments: pt up in recliner  Transfers Overall transfer level: Needs assistance Equipment used: Rolling walker (2 wheeled) Transfers: Sit to/from [employer] CorporationStand;Stand Pivot Transfers Sit to Stand: Mod assist;Max assist Stand pivot transfers: Mod assist       General  transfer comment: Pt needed close to max assist to power up.  Had difficulty with foot placement.  Pt was able to take pivotal steps to chair with assist and cues.  Needed assist to control descent into chair due to pain    Balance Overall balance assessment: Needs assistance;History of Falls Sitting-balance support: No upper extremity supported;Feet supported Sitting balance-Leahy Scale: Fair   Postural control: Posterior lean Standing balance support: Bilateral upper extremity supported;During functional activity Standing balance-Leahy Scale: Poor Standing balance comment: Needed mod assist with RW with pt with significant posterior lean.  Pt needed cues for postural stability.  Pt limited by pain.                             ADL Overall ADL's : Needs assistance/impaired     Grooming: Wash/dry hands;Wash/dry face;Min guard;Sitting   Upper Body Bathing: Min guard;Sitting   Lower Body Bathing: Maximal assistance   Upper Body Dressing : Min guard;Sitting   Lower Body Dressing: Total assistance   Toilet Transfer: Moderate assistance;Maximal assistance;RW;Ambulation;BSC;Cueing for safety;Cueing for sequencing   Toileting- Clothing Manipulation and Hygiene: Maximal assistance   Tub/ Shower Transfer: Moderate assistance;Maximal assistance;3 in 1;Rolling walker;Ambulation   Functional mobility during ADLs: Moderate assistance;Maximal assistance;Cueing for safety;Rolling walker;Cueing for sequencing       Vision Vision Assessment?: No apparent visual deficits              Pertinent Vitals/Pain Pain Assessment: 0-10 Pain Score: 7  Pain Location: pelvis after activity Pain Descriptors / Indicators: Aching;Sore;Grimacing;Guarding Pain Intervention(s): Limited activity within patient's  tolerance;Monitored during session;Patient requesting pain meds-RN notified;Repositioned     Hand Dominance Right   Extremity/Trunk Assessment Upper Extremity Assessment Upper  Extremity Assessment: Overall WFL for tasks assessed       Cervical / Trunk Assessment Cervical / Trunk Assessment: Kyphotic   Communication Communication Communication: HOH (has hearing aids)   Cognition Arousal/Alertness: Awake/alert Behavior During Therapy: WFL for tasks assessed/performed Overall Cognitive Status: Within Functional Limits for tasks assessed                     General Comments   pt very pleasant and cooperative                 Home Living Family/patient expects to be discharged to:: Private residence Living Arrangements: Alone Available Help at Discharge: Family;Friend(s);Available PRN/intermittently Type of Home: Apartment       Home Layout: One level     Bathroom Shower/Tub: Producer, television/film/video: Handicapped height Bathroom Accessibility: Yes How Accessible: Accessible via walker Home Equipment: Walker - 4 wheels;Walker - 2 wheels;Toilet riser;Shower seat;Hand held shower head;Grab bars - tub/shower;Adaptive equipment Adaptive Equipment: Reacher;Sock aid;Long-handled shoe horn        Prior Functioning/Environment Level of Independence: Independent with assistive device(s)        Comments: Pt uses RW indoors and rollator outdoors for ambulation PTA. Has Bil  AFOs. Drives, cooks.         OT Problem List: Decreased strength;Impaired balance (sitting and/or standing);Pain;Decreased activity tolerance;Decreased knowledge of use of DME or AE   OT Treatment/Interventions: Self-care/ADL training;DME and/or AE instruction;Patient/family education;Therapeutic activities    OT Goals(Current goals can be found in the care plan section) Acute Rehab OT Goals Patient Stated Goal: to get better OT Goal Formulation: With patient Time For Goal Achievement: 01/05/17 Potential to Achieve Goals: Good ADL Goals Pt Will Perform Grooming: with min assist;with min guard assist;standing Pt Will Perform Upper Body Bathing: with  supervision;with set-up;sitting Pt Will Perform Lower Body Bathing: with mod assist Pt Will Perform Upper Body Dressing: with set-up;with supervision;sitting Pt Will Transfer to Toilet: with mod assist;with min assist;bedside commode Pt Will Perform Toileting - Clothing Manipulation and hygiene: sitting/lateral leans;sit to/from stand;with mod assist Pt Will Perform Tub/Shower Transfer: with mod assist;with min assist;3 in 1;rolling walker;ambulating  OT Frequency: Min 2X/week   Barriers to D/C: Decreased caregiver support                        End of Session Equipment Utilized During Treatment: Gait belt;Rolling walker;Other (comment) (BSC, B AFOs)  Activity Tolerance:   Patient left: in chair;with call bell/phone within reach;with chair alarm set;with family/visitor present   Time: 1204-1229 OT Time Calculation (min): 25 min Charges:  OT General Charges $OT Visit: 1 Procedure OT Evaluation $OT Eval Moderate Complexity: 1 Procedure OT Treatments $Therapeutic Activity: 8-22 mins G-Codes:    Galen Manila 12/29/2016, 12:43 PM

## 2016-12-29 NOTE — [place] FL2 (Signed)
[place] MEDICAID FL2 LEVEL OF CARE SCREENING TOOL     IDENTIFICATION  Patient Name: [name] Weber Cancel Birthdate: 10/06/1931 Sex: female Admission Date (Current Location): 12/26/2016  [facility] [place]   Facility and Address:  The . [facility], [address], [place], [place] [address]      Provider Number: [id number]  Attending Physician Name and Address:  Maretta BeesShanker M Ghimire, MD  Relative Name and Phone Number:       Current Level of Care: Hospital Recommended Level of Care: Skilled Nursing Facility Prior Approval Number:    Date Approved/Denied: 12/29/16 PASRR Number: [id number]  Discharge Plan: SNF    Current Diagnoses: Patient Active Problem List   Diagnosis Date Noted  . Fractures   . Pain   . Generalized anxiety disorder   . Benign essential HTN   . Hypoalbuminemia due to protein-calorie malnutrition (HCC)   . Thrombocytopenia (HCC)   . Pulmonary hypertension   . Age-related osteoporosis with current pathological fracture   . Pelvic fracture (HCC) 12/26/2016  . Multiple closed fractures of pelvis with stable disruption of pelvic circle (HCC)   . Memory loss   . Lumbar burst fracture (HCC) 06/08/2016  . Lumbar compression fracture (HCC) 06/08/2016  . Bilateral foot-drop   . Midline thoracic back pain   . A-fib (HCC)   . Depression   . Constipation due to pain medication   . Chronic constipation   . Compression fracture of L2 (HCC) 05/30/2016  . Elevated blood pressure 05/30/2016  . L2 vertebral fracture (HCC) 05/30/2016  . Anxiety state 12/10/2014  . Neurogenic bladder 11/30/2014  . Neurogenic bowel 11/30/2014  . Bacterial UTI 11/30/2014  . Thoracic myelopathy 11/29/2014  . Persistent atrial fibrillation (HCC) 11/27/2014  . Traumatic spinal subdural hematoma   . Acute pulmonary embolism (HCC)   . Orthostatic hypotension 11/22/2014  . Near syncope 11/21/2014  . Paraplegia at T9 level (HCC) 11/17/2014  .  Weakness of both legs   . Nocturnal leg cramps 07/16/2014  . Encounter for therapeutic drug monitoring 12/25/2013  . Malaise and fatigue 06/01/2011  . Atrial flutter (HCC) 03/10/2011  . Long term (current) use of anticoagulants 03/10/2011  . Benign hypertensive heart disease without heart failure 03/10/2011  . HLD (hyperlipidemia) 03/10/2011  . Paroxysmal atrial flutter (HCC)   . Palpitations   . PAC (premature atrial contraction)   . PVC's (premature ventricular contractions)   . Malaise   . Fatigue   . Myalgia     Orientation RESPIRATION BLADDER Height & Weight     Situation, Place, Time, Self  Normal Continent Weight: 121 lb 0.5 oz (54.9 kg) Height:  5\' 6"  (167.6 cm)  BEHAVIORAL SYMPTOMS/MOOD NEUROLOGICAL BOWEL NUTRITION STATUS      Continent  (Please see d/Weber summary)  AMBULATORY STATUS COMMUNICATION OF NEEDS Skin   Supervision Verbally Normal                       Personal Care Assistance Level of Assistance  Bathing, Feeding, Dressing Bathing Assistance: Maximum assistance Feeding assistance: Limited assistance Dressing Assistance: Maximum assistance     Functional Limitations Info  Sight, Hearing, Speech Sight Info: Adequate Hearing Info: Adequate Speech Info: Adequate    SPECIAL CARE FACTORS FREQUENCY  PT (By licensed PT), OT (By licensed OT)     PT Frequency: 5x week OT Frequency: 5x week            Contractures Contractures Info: Not  present    Additional Factors Info  Code Status, Allergies Code Status Info: Full Code Allergies Info: Crestor Rosuvastatin Calcium, Rosuvastatin           Current Medications (12/29/2016):  This is the current hospital active medication list Current Facility-Administered Medications  Medication Dose Route Frequency Provider Last Rate Last Dose  . acetaminophen (TYLENOL) tablet 650 mg  650 mg Oral Q6H PRN Isaiah Blakes, PA-Weber       Or  . acetaminophen (TYLENOL) suppository 650 mg  650 mg Rectal Q6H PRN  Isaiah Blakes, PA-Weber      . acetaminophen (TYLENOL) tablet 1,000 mg  1,000 mg Oral Once Melene Plan, DO      . alum & mag hydroxide-simeth (MAALOX/MYLANTA) 200-200-20 MG/5ML suspension 30 mL  30 mL Oral Q6H PRN Isaiah Blakes, PA-Weber   30 mL at 12/28/16 1946  . aspirin EC tablet 81 mg  81 mg Oral Daily Isaiah Blakes, PA-Weber   81 mg at 12/29/16 1610  . digoxin (LANOXIN) tablet 125 mcg  125 mcg Oral Daily Ancil Linsey [place], [place]   125 mcg at 12/29/16 9604  . heparin injection 5,000 Units  5,000 Units Subcutaneous Q8H Ancil Linsey [place], [place]      . HYDROcodone-acetaminophen (NORCO/VICODIN) 5-325 MG per tablet 1-2 tablet  1-2 tablet Oral Q4H PRN Isaiah Blakes, PA-Weber   2 tablet at 12/28/16 1812  . latanoprost (XALATAN) 0.005 % ophthalmic solution 1 drop  1 drop Both Eyes QHS Isaiah Blakes, PA-Weber   1 drop at 12/28/16 2206  . morphine 4 MG/ML injection 2 mg  2 mg Intravenous Q2H PRN Isaiah Blakes, PA-Weber      . omega-3 acid ethyl esters (LOVAZA) capsule 1 g  1 g Oral Daily Isaiah Blakes, PA-Weber   1 g at 12/29/16 5409  . ondansetron (ZOFRAN) tablet 4 mg  4 mg Oral Q6H PRN Isaiah Blakes, PA-Weber       Or  . ondansetron [facility]) injection 4 mg  4 mg Intravenous Q6H PRN Isaiah Blakes, PA-Weber   4 mg at 12/28/16 0655  . polyethylene glycol (MIRALAX / GLYCOLAX) packet 17 g  17 g Oral Daily PRN Isaiah Blakes, PA-Weber   17 g at 12/29/16 0929  . traZODone (DESYREL) tablet 25 mg  25 mg Oral QHS PRN Isaiah Blakes, PA-Weber         Discharge Medications: Please see discharge summary for a list of discharge medications.  Relevant Imaging Results:  Relevant Lab Results:   Additional Information SSN: [id number]  Volney American, LCSW

## 2016-12-29 NOTE — Progress Notes (Signed)
Physical Therapy Treatment Patient Details Name: [name] Weber MRN: [id number] DOB: 03/28/1931 Today's Date: 12/29/2016    History of Present Illness  [name] Weber is a [age] y.o. female with history of A fib, T-9 paraplegia s/p thoracic laminectomy for abscess (off anticoagulation due to epidural hemorrhage), L 2 compression fracture--CIR stay 05/2016, anxiety disorder, bilateral foot drop managed by AFOs, gait disorder with ongoing outpatient therapy;  who was admitted on 12/26/16 after fall due to syncope and inability to walk. History taken from chart review and patient. She was found to have pelvic ring fracture as well as reports of new low back pain. Dr. Carola FrostHandy consulted and non-surgical management recommended. To be WBAT with B-AFOs and walker.    PT Comments    Patient is progressing toward mobility goals and tolerated short gait distance this session. Pt continues to be good candidate for CIR for further skilled PT services.    Follow Up Recommendations  CIR;Supervision/Assistance - 24 hour     Equipment Recommendations  None recommended by PT    Recommendations for Other Services Rehab consult     Precautions / Restrictions Precautions Precautions: Fall Required Braces or Orthoses: Other Brace/Splint Other Brace/Splint: Bil AFOs Restrictions Weight Bearing Restrictions: Yes RLE Weight Bearing: Weight bearing as tolerated LLE Weight Bearing: Weight bearing as tolerated    Mobility  Bed Mobility Overal bed mobility: Needs Assistance Bed Mobility: Supine to Sit     Supine to sit: Mod assist;+2 for physical assistance;HOB elevated     General bed mobility comments: assist at R LE and trunk to come into sitting; use of bed rail and bed pad; most painful with transitional movements; cues for sequencing and technique  Transfers Overall transfer level: Needs assistance Equipment used: Rolling walker (2 wheeled) Transfers: Sit to/from Stand Sit to Stand: Mod  assist;+2 physical assistance Stand pivot transfers: Mod assist       General transfer comment: assist to power up into standing with bilat feet blocked; pt uses bilat LE against EOB to assist in pushing up into standing; cues for hand/foot placement; assist to steady upon standing and to perform pre gait activities  Ambulation/Gait Ambulation/Gait assistance: Min assist;+2 safety/equipment Ambulation Distance (Feet): 10 Feet Assistive device: Rolling walker (2 wheeled) Gait Pattern/deviations: Step-through pattern;Decreased stance time - right;Decreased step length - left;Decreased step length - right;Decreased weight shift to right;Antalgic     General Gait Details: cues for sequencing of gait with use of AD and vc and assist to weight shift and advance RW    Stairs            Wheelchair Mobility    Modified Rankin (Stroke Patients Only)       Balance Overall balance assessment: Needs assistance;History of Falls Sitting-balance support: No upper extremity supported;Feet supported Sitting balance-Leahy Scale: Fair   Postural control: Posterior lean Standing balance support: Bilateral upper extremity supported;During functional activity Standing balance-Leahy Scale: Poor Standing balance comment: Needed mod assist with RW with pt with significant posterior lean.  Pt needed cues for postural stability.  Pt limited by pain.                     Cognition Arousal/Alertness: Awake/alert Behavior During Therapy: WFL for tasks assessed/performed Overall Cognitive Status: Within Functional Limits for tasks assessed                      Exercises      General Comments General comments (skin integrity,  edema, etc.): total A to don AFOs      Pertinent Vitals/Pain Pain Assessment: Faces Pain Score: 7  Faces Pain Scale: Hurts even more Pain Location: pelvis Pain Descriptors / Indicators: Aching;Grimacing;Guarding;Sore Pain Intervention(s): Limited  activity within patient's tolerance;Monitored during session;Premedicated before session;Repositioned    Home Living Family/patient expects to be discharged to:: Private residence Living Arrangements: Alone Available Help at Discharge: Family;Friend(s);Available PRN/intermittently Type of Home: Apartment     Home Layout: One level Home Equipment: Walker - 4 wheels;Walker - 2 wheels;Toilet riser;Shower seat;Hand held shower head;Grab bars - tub/shower;Adaptive equipment      Prior Function Level of Independence: Independent with assistive device(s)      Comments: Pt uses RW indoors and rollator outdoors for ambulation PTA. Has Bil  AFOs. Drives, cooks.    PT Goals (current goals can now be found in the care plan section) Acute Rehab PT Goals Patient Stated Goal: go to CIR PT Goal Formulation: With patient Time For Goal Achievement: 01/11/17 Potential to Achieve Goals: Good Progress towards PT goals: Progressing toward goals    Frequency    Min 5X/week      PT Plan Current plan remains appropriate    Co-evaluation             End of Session Equipment Utilized During Treatment: Gait belt Activity Tolerance: Patient limited by fatigue;Patient limited by pain Patient left: in chair;with call bell/phone within reach     Time: 1129-1155 PT Time Calculation (min) (ACUTE ONLY): 26 min  Charges:  $Gait Training: 8-22 mins $Therapeutic Activity: 8-22 mins                    G Codes:      Derek Mound, PTA Pager: [phone]   12/29/2016, 1:45 PM

## 2016-12-29 NOTE — Progress Notes (Signed)
Rehab admissions - I met with patient.  I gave her rehab booklets and explained inpatient rehab.  She would like to come to rehab.  Awaiting OT eval and then will submit to [place].  Call me for questions.  #[phone]

## 2016-12-30 ENCOUNTER — Inpatient Hospital Stay (HOSPITAL_COMMUNITY): Payer: Medicare Other

## 2016-12-30 ENCOUNTER — Ambulatory Visit: Payer: Medicare Other | Admitting: Physical Therapy

## 2016-12-30 DIAGNOSIS — M8000XS Age-related osteoporosis with current pathological fracture, unspecified site, sequela: Secondary | ICD-10-CM

## 2016-12-30 DIAGNOSIS — G959 Disease of spinal cord, unspecified: Secondary | ICD-10-CM

## 2016-12-30 DIAGNOSIS — S32810S Multiple fractures of pelvis with stable disruption of pelvic ring, sequela: Secondary | ICD-10-CM

## 2016-12-30 DIAGNOSIS — K5901 Slow transit constipation: Secondary | ICD-10-CM

## 2016-12-30 LAB — VITAMIN D 25 HYDROXY (VIT D DEFICIENCY, FRACTURES): Vit D, 25-Hydroxy: 37.8 ng/mL (ref 30.0–100.0)

## 2016-12-30 MED ORDER — HYDROCODONE-ACETAMINOPHEN 5-325 MG PO TABS: 1.0000 | ORAL_TABLET | Freq: Four times a day (QID) | ORAL | 0 refills | Status: DC | PRN

## 2016-12-30 MED ORDER — POLYETHYLENE GLYCOL 3350 17 G PO PACK: 17.0000 g | PACK | Freq: Every day | ORAL | 0 refills | Status: DC | PRN

## 2016-12-30 NOTE — Progress Notes (Signed)
Rehab admissions - The patient and her HCPOA, [employer] [place], are now aware of insurance denial.  [employer] is pursuing an expedited appeal with [facility].  This appeal process can take up to 72 hours to receive an insurance decision through [facility]. [employer] has all of the information to start the appeal process.  Call me for questions.  #[phone]

## 2016-12-30 NOTE — Clinical Social Work Placement (Signed)
   CLINICAL SOCIAL WORK PLACEMENT  NOTE  Date:  12/30/2016  Patient Details  Name: [name] Weber MRN: [id number] Date of Birth: 05/17/1931  Clinical Social Work is seeking post-discharge placement for this patient at the Skilled  Nursing Facility level of care (*CSW will initial, date and re-position this form in  chart as items are completed):      Patient/family provided with [facility] Clinical Social Work Department's list of facilities offering this level of care within the geographic area requested by the patient (or if unable, by the patient's family).      Patient/family informed of their freedom to choose among providers that offer the needed level of care, that participate in Medicare, Medicaid or managed care program needed by the patient, have an available bed and are willing to accept the patient.      Patient/family informed of [facility] ownership interest in [facility] and [facility], as well as of the fact that they are under no obligation to receive care at these facilities.  PASRR submitted to EDS on       PASRR number received on 12/29/16     Existing PASRR number confirmed on       FL2 transmitted to all facilities in geographic area requested by pt/family on 12/29/16     FL2 transmitted to all facilities within larger geographic area on       Patient informed that his/her managed care company has contracts with or will negotiate with certain facilities, including the following:        Yes   Patient/family informed of bed offers received.  [facility]     Physician recommends and patient chooses bed at      [facility] 12[facility]30[facility].  Patient to be transferred to facility by [facility]     Patient family notified on 12/30/16 of transfer.  Name of family member notified:  Via     PHYSICIAN       Additional Comment:     _______________________________________________ Volney AmericanBridget A Mayton, LCSW 12/30/2016, 11:49 AM

## 2016-12-30 NOTE — Discharge Instructions (Signed)
Orthopaedic Trauma Service Discharge Instructions  General Discharge Instructions  WEIGHT BEARING STATUS: weightbear as tolerated Right leg. Be sure to wear AFO braces when walking  RANGE OF MOTION/ACTIVITY: as tolerated, no restrictions    PAIN MEDICATION USE AND EXPECTATIONS  You have likely been given narcotic medications to help control your pain.  After a traumatic event that results in an fracture (broken bone) with or without surgery, it is ok to use narcotic pain medications to help control one's pain.  We understand that everyone responds to pain differently and each individual patient will be evaluated on a regular basis for the continued need for narcotic medications. Ideally, narcotic medication use should last no more than 6-8 weeks (coinciding with fracture healing).   As a patient it is your responsibility as well to monitor narcotic medication use and report the amount and frequency you use these medications when you come to your office visit.   We would also advise that if you are using narcotic medications, you should take a dose prior to therapy to maximize you participation.  IF YOU ARE ON NARCOTIC MEDICATIONS IT IS NOT PERMISSIBLE TO OPERATE A MOTOR VEHICLE (MOTORCYCLE/CAR/TRUCK/MOPED) OR HEAVY MACHINERY DO NOT MIX NARCOTICS WITH OTHER CNS (CENTRAL NERVOUS SYSTEM) DEPRESSANTS SUCH AS ALCOHOL  Diet: as you were eating previously.  Can use over the counter stool softeners and bowel preparations, such as Miralax, to help with bowel movements.  Narcotics can be constipating.  Be sure to drink plenty of fluids   DO NOT USE NONSTEROIDAL ANTI-INFLAMMATORY DRUGS (NSAID'S)  Using products such as Advil (ibuprofen), Aleve (naproxen), Motrin (ibuprofen) for additional pain control during fracture healing can delay and/or prevent the healing response.  If you would like to take over the counter (OTC) medication, Tylenol (acetaminophen) is ok.  However, some narcotic medications that are  given for pain control contain acetaminophen as well. Therefore, you should not exceed more than 4000 mg of tylenol in a day if you do not have liver disease.  Also note that there are may OTC medicines, such as cold medicines and allergy medicines that my contain tylenol as well.  If you have any questions about medications and/or interactions please ask your doctor/PA or your pharmacist.      ICE AND ELEVATE INJURED/OPERATIVE EXTREMITY  Using ice and elevating the injured extremity above your heart can help with swelling and pain control.  Icing in a pulsatile fashion, such as 20 minutes on and 20 minutes off, can be followed.    Do not place ice directly on skin. Make sure there is a barrier between to skin and the ice pack.    Using frozen items such as frozen peas works well as the conform nicely to the are that needs to be iced.  CALL THE OFFICE WITH ANY QUESTIONS OR CONCERNS: [phone]    .

## 2016-12-30 NOTE — Care Management Important Message (Signed)
Important Message  Patient Details  Name: [name] Weber MRN: [id number] Date of Birth: 06/20/1931   Medicare Important Message Given:  Yes    Arwyn Besaw Stefan [place] 12/30/2016, 12:41 PM

## 2016-12-30 NOTE — Progress Notes (Signed)
Report given to [facility] staff.

## 2016-12-30 NOTE — Discharge Summary (Signed)
PATIENT DETAILS Name: [name] Weber Age: 83 y.o. Sex: female Date of Birth: 01-12-1931 MRN: [id number]. Admitting Physician: Pete Glatter, MD WUJ:[id number] NEVILL, MD  Admit Date: 12/26/2016 Discharge date: 12/30/2016  Recommendations for Outpatient Follow-up:  1. Follow up with PCP in 1-2 weeks 2. Please obtain BMP/CBC in one week 3. Please ensure follow up with  Dr Carola Frost  Admitted From:  Home  Disposition: SNF   Home Health: No  Equipment/Devices: None  Discharge Condition: Stable  CODE STATUS: FULL CODE  Diet recommendation:  Heart Healthy   Brief Summary: See H&P, Labs, Consult and Test reports for all details in brief, Patient is a 83 y.o. female with past medical history of atrial fibrillation no longer an anticoagulation (prior history of epidural hemorrhage) who sustained a mechanical fall/syncope and was found to have pelvic fracture. Seen by orthopedics, initially surgical repair was contemplated, but after evaluation by trauma orthopedics-commended lesions were to pursue nonoperative treatment.  Brief Hospital Course: Pelvic fracture: Secondary to fall/syncope, orthopedics initially felt that surgical repair was appropriate, but upon reevaluation by trauma orthopedics-nonoperative management has been recommended. Seen by physical therapy-recommendations are to pursue CIR eval. unfortunately, her insurance company has denied CIR stay, recommendations from social work are to discharge to SNF. She is stable for discharge today. Please ensure follow-up with Dr Carola Frost.  Mechanical fall vs syncope: Patient not entirely sure whether she lost consciousness, EKG/telemetry shows atrial fibrillation. Echocardiogram shows preserved ejection fraction. Doubt any further workup required.   Chronic atrial fibrillation: Rate controlled, continue digoxin. No longer on anticoagulation given prior history of epidural hematoma. Continue aspirin  History of  hypertension: Blood pressure currently controlled without the use of any antihypertensives. Follow  Procedures/Studies: None  Discharge Diagnoses:  Principal Problem:   Pelvic fracture (HCC) Active Problems:   Benign hypertensive heart disease without heart failure   HLD (hyperlipidemia)   Near syncope   Orthostatic hypotension   A-fib (HCC)   Multiple closed fractures of pelvis with stable disruption of pelvic circle (HCC)   Fractures   Pain   Generalized anxiety disorder   Benign essential HTN   Hypoalbuminemia due to protein-calorie malnutrition (HCC)   Thrombocytopenia (HCC)   Pulmonary hypertension   Age-related osteoporosis with current pathological fracture   Discharge Instructions:  Discharge Instructions    Call MD for:  severe uncontrolled pain    Complete by:  As directed    Diet - low sodium heart healthy    Complete by:  As directed    Increase activity slowly    Complete by:  As directed    WBAT R leg Must be in AFOs for ambulation  No ROM restrictions  Passive and active motion of R leg   Weight bearing as tolerated    Complete by:  As directed    Laterality:  right   Extremity:  Lower     Allergies as of 12/30/2016      Reactions   Crestor [rosuvastatin Calcium] Other (See Comments)   "bones ache all over"   Rosuvastatin Other (See Comments)   "bones ache all over" Bone pain all over body      Medication List    TAKE these medications   aspirin 81 MG EC tablet Take 1 tablet (81 mg total) by mouth daily.   CALCIUM 600 + D PO Take 1 tablet by mouth daily.   digoxin 0.125 MG tablet Commonly known as:  LANOXIN TAKE 1 TABLET BY MOUTH DAILY  FISH OIL ULTRA 1400 MG Caps Take 1,400 mg by mouth daily.   HYDROcodone-acetaminophen 5-325 MG tablet Commonly known as:  NORCO/VICODIN Take 1-2 tablets by mouth every 6 (six) hours as needed for moderate pain.   latanoprost 0.005 % ophthalmic solution Commonly known as:  XALATAN Place 1 drop  into both eyes at bedtime.   Magnesium 400 MG Caps Take 400 mg by mouth daily.   polyethylene glycol packet Commonly known as:  MIRALAX / GLYCOLAX Take 17 g by mouth daily as needed for mild constipation.   Turmeric 500 MG Caps Take 500 mg by mouth daily.      Follow-up Information    HANDY,MICHAEL H, MD. Schedule an appointment as soon as possible for a visit in 2 week(s).   Specialty:  Orthopedic Surgery Why:  follow up of pelvic fracture  Contact information: [address] [place] [address] [phone]        Pearla Dubonnet, MD. Schedule an appointment as soon as possible for a visit in 1 week(s).   Specialty:  Internal Medicine Contact information: [address] [place] [address] [phone]          Allergies  Allergen Reactions  . Crestor [Rosuvastatin Calcium] Other (See Comments)    "bones ache all over"  . Rosuvastatin Other (See Comments)    "bones ache all over" Bone pain all over body   Consultations:   orthopedic surgery  Other Procedures/Studies: Dg Chest 1 View  Result Date: 12/26/2016 CLINICAL DATA:  Fall EXAM: CHEST 1 VIEW COMPARISON:  11/13/2014 FINDINGS: Mild cardiomegaly. There is hyperinflation of the lungs compatible with COPD. No confluent opacities or effusions. No acute bony abnormality. IMPRESSION: COPD.  Cardiomegaly.  No active disease. Electronically Signed   By: Charlett Nose M.D.   On: 12/26/2016 09:01   Ct Pelvis Wo Contrast  Result Date: 12/26/2016 CLINICAL DATA:  Status post fall. EXAM: CT PELVIS WITHOUT CONTRAST TECHNIQUE: Multidetector CT imaging of the pelvis was performed following the standard protocol without intravenous contrast. COMPARISON:  None. FINDINGS: Urinary Tract: No renal abnormality visualized. The urinary bladder is significantly distended. Bowel: Diffuse left colonic diverticulosis without evidence of diverticulitis on this nonenhanced exam. Vascular/Lymphatic: No  pathologically enlarged lymph nodes. Advanced calcific atherosclerotic disease of the aorta and its branches. IVC filter is in place. Reproductive:  Post hysterectomy. Other:  None. Musculoskeletal: There is a comminuted impacted fracture of the right inferior pubic ramus with buckle 0.8 similarly comminuted fracture of the superior pubic ramus on the right. There is a minimally displaced fracture of the right ischium and ilium extending to the SI joint. There is a questionable tiny avulsion fracture of the right anterior sacrum at the anterior portion of the right sacroiliac joint. There is generalized advanced osteopenia. No other fractures are seen. There are severe osteoarthritic changes of the visualized portion of the lumbosacral spine with likely degenerative grade 1 anterolisthesis of L5 on S1. IMPRESSION: Significant distension of the urinary bladder. Please correlate clinically. Advanced calcific atherosclerotic disease of the aorta. Advanced osteopenia. Right-sided pelvic fractures to include comminuted impacted fractures of the right inferior and superior pubic rami, minimally displaced fractures of the right ischium and ileum extending to the SI joint, and probable avulsion fractures of the right sacrum at the inferior SI joint. Severe osteoarthritic changes of the visualized portion of the lumbosacral spine with grade 1 anterolisthesis of L5 on S1. Electronically Signed   By: Ulanda Edison.D.  On: 12/26/2016 10:41   Dg Pelvis Comp Min 3v  Result Date: 12/30/2016 CLINICAL DATA:  Pelvic ring fracture, fell on 12/26/2016 landing on the right hip with pain EXAM: JUDET PELVIS - 3+ VIEW COMPARISON:  Pelvis films of 12/28/2016 and CT pelvis of 12/26/2016 FINDINGS: Displaced fractures of the right superior and inferior pelvic rami are again noted. The bones are osteopenic. No additional fracture is seen. The SI joints are unremarkable. IMPRESSION: No change in displaced right superior and inferior  pelvic rami fractures. Osteopenia. Electronically Signed   By: Dwyane Dee M.D.   On: 12/30/2016 09:07   Dg Pelvis Comp Min 3v  Result Date: 12/28/2016 CLINICAL DATA:  Pelvic fracture.  Status post fall in the bathroom. EXAM: JUDET PELVIS - 3+ VIEW COMPARISON:  Pelvic CT 12/26/2016 FINDINGS: Generalized osteopenia. Comminuted fracture of the right superior pubic ramus with 9 mm of inferior displacement of the more medial fragment. Mildly comminuted fracture of the right inferior pubic ramus. Right posterior ilial fracture and right sacral fracture are not well delineated on the x-ray compared with the recent CT performed 12/26/2016. IMPRESSION: 1. Comminuted fracture of the right superior pubic ramus with 9 mm of inferior displacement of the more medial fragment. Mildly comminuted fracture of the right inferior pubic ramus. Electronically Signed   By: Elige Ko   On: 12/28/2016 14:17   Dg Hip Unilat With Pelvis 2-3 Views Right  Result Date: 12/26/2016 CLINICAL DATA:  Right groin pain. EXAM: DG HIP (WITH OR WITHOUT PELVIS) 2-3V RIGHT COMPARISON:  Lumbar spine 08/10/2016 FINDINGS: New displaced fractures involving the right inferior and superior pubic rami. Possible fractures involving the right side of the sacrum demonstrated by disruption of the sacral arcuate lines. Atherosclerotic calcifications in the iliac arteries. The right hip is located without a fracture. IMPRESSION: Displaced right pubic rami fractures.  Possible sacral fractures. Electronically Signed   By: Richarda Overlie M.D.   On: 12/26/2016 09:02      TODAY-DAY OF DISCHARGE:  Subjective:   Kashira Behunin today has no headache,no chest abdominal pain,no new weakness tingling or numbness, feels much better wants to go home today.   Objective:   Blood pressure 117/68, pulse 80, temperature 98.2 F (36.8 C), temperature source Oral, resp. rate 16, height 5\' 6"  (1.676 m), weight 54.9 kg (121 lb 0.5 oz), SpO2 98 %.  Intake/Output  Summary (Last 24 hours) at 12/30/16 1041 Last data filed at 12/30/16 0600  Gross per 24 hour  Intake              476 ml  Output              700 ml  Net             -224 ml   Filed Weights   12/26/16 0753 12/27/16 0610 12/28/16 0619  Weight: 49.4 kg (109 lb) 52.2 kg (115 lb 1.3 oz) 54.9 kg (121 lb 0.5 oz)    Exam: Awake Alert, Oriented *3, No new F.N deficits, Normal affect [place].AT,PERRAL Supple Neck,No JVD, No cervical lymphadenopathy appriciated.  Symmetrical Chest wall movement, Good air movement bilaterally, CTAB RRR,No Gallops,Rubs or new Murmurs, No Parasternal Heave +ve B.Sounds, Abd Soft, Non tender, No organomegaly appriciated, No rebound -guarding or rigidity. No Cyanosis, Clubbing or edema, No new Rash or bruise   PERTINENT RADIOLOGIC STUDIES: Dg Chest 1 View  Result Date: 12/26/2016 CLINICAL DATA:  Fall EXAM: CHEST 1 VIEW COMPARISON:  11/13/2014 FINDINGS: Mild cardiomegaly. There is hyperinflation of  the lungs compatible with COPD. No confluent opacities or effusions. No acute bony abnormality. IMPRESSION: COPD.  Cardiomegaly.  No active disease. Electronically Signed   By: Charlett Nose M.D.   On: 12/26/2016 09:01   Ct Pelvis Wo Contrast  Result Date: 12/26/2016 CLINICAL DATA:  Status post fall. EXAM: CT PELVIS WITHOUT CONTRAST TECHNIQUE: Multidetector CT imaging of the pelvis was performed following the standard protocol without intravenous contrast. COMPARISON:  None. FINDINGS: Urinary Tract: No renal abnormality visualized. The urinary bladder is significantly distended. Bowel: Diffuse left colonic diverticulosis without evidence of diverticulitis on this nonenhanced exam. Vascular/Lymphatic: No pathologically enlarged lymph nodes. Advanced calcific atherosclerotic disease of the aorta and its branches. IVC filter is in place. Reproductive:  Post hysterectomy. Other:  None. Musculoskeletal: There is a comminuted impacted fracture of the right inferior pubic ramus with  buckle 0.8 similarly comminuted fracture of the superior pubic ramus on the right. There is a minimally displaced fracture of the right ischium and ilium extending to the SI joint. There is a questionable tiny avulsion fracture of the right anterior sacrum at the anterior portion of the right sacroiliac joint. There is generalized advanced osteopenia. No other fractures are seen. There are severe osteoarthritic changes of the visualized portion of the lumbosacral spine with likely degenerative grade 1 anterolisthesis of L5 on S1. IMPRESSION: Significant distension of the urinary bladder. Please correlate clinically. Advanced calcific atherosclerotic disease of the aorta. Advanced osteopenia. Right-sided pelvic fractures to include comminuted impacted fractures of the right inferior and superior pubic rami, minimally displaced fractures of the right ischium and ileum extending to the SI joint, and probable avulsion fractures of the right sacrum at the inferior SI joint. Severe osteoarthritic changes of the visualized portion of the lumbosacral spine with grade 1 anterolisthesis of L5 on S1. Electronically Signed   By: Ted Mcalpine M.D.   On: 12/26/2016 10:41   Dg Pelvis Comp Min 3v  Result Date: 12/30/2016 CLINICAL DATA:  Pelvic ring fracture, fell on 12/26/2016 landing on the right hip with pain EXAM: JUDET PELVIS - 3+ VIEW COMPARISON:  Pelvis films of 12/28/2016 and CT pelvis of 12/26/2016 FINDINGS: Displaced fractures of the right superior and inferior pelvic rami are again noted. The bones are osteopenic. No additional fracture is seen. The SI joints are unremarkable. IMPRESSION: No change in displaced right superior and inferior pelvic rami fractures. Osteopenia. Electronically Signed   By: Dwyane Dee M.D.   On: 12/30/2016 09:07   Dg Pelvis Comp Min 3v  Result Date: 12/28/2016 CLINICAL DATA:  Pelvic fracture.  Status post fall in the bathroom. EXAM: JUDET PELVIS - 3+ VIEW COMPARISON:  Pelvic CT  12/26/2016 FINDINGS: Generalized osteopenia. Comminuted fracture of the right superior pubic ramus with 9 mm of inferior displacement of the more medial fragment. Mildly comminuted fracture of the right inferior pubic ramus. Right posterior ilial fracture and right sacral fracture are not well delineated on the x-ray compared with the recent CT performed 12/26/2016. IMPRESSION: 1. Comminuted fracture of the right superior pubic ramus with 9 mm of inferior displacement of the more medial fragment. Mildly comminuted fracture of the right inferior pubic ramus. Electronically Signed   By: Elige Ko   On: 12/28/2016 14:17   Dg Hip Unilat With Pelvis 2-3 Views Right  Result Date: 12/26/2016 CLINICAL DATA:  Right groin pain. EXAM: DG HIP (WITH OR WITHOUT PELVIS) 2-3V RIGHT COMPARISON:  Lumbar spine 08/10/2016 FINDINGS: New displaced fractures involving the right inferior and superior pubic rami.  Possible fractures involving the right side of the sacrum demonstrated by disruption of the sacral arcuate lines. Atherosclerotic calcifications in the iliac arteries. The right hip is located without a fracture. IMPRESSION: Displaced right pubic rami fractures.  Possible sacral fractures. Electronically Signed   By: Richarda Overlie M.D.   On: 12/26/2016 09:02     PERTINENT LAB RESULTS: CBC:  Recent Labs  12/28/16 0704  WBC 7.5  HGB 13.7  HCT 42.3  PLT 146*   CMET CMP     Component Value Date/Time   NA 138 12/27/2016 0357   K 4.0 12/27/2016 0357   CL 104 12/27/2016 0357   CO2 28 12/27/2016 0357   GLUCOSE 106 (H) 12/27/2016 0357   BUN 19 12/27/2016 0357   CREATININE 0.61 12/27/2016 0357   CALCIUM 8.7 (L) 12/27/2016 0357   PROT 5.9 (L) 12/27/2016 0357   ALBUMIN 3.2 (L) 12/27/2016 0357   AST 28 12/27/2016 0357   ALT 18 12/27/2016 0357   ALKPHOS 50 12/27/2016 0357   BILITOT 0.4 12/27/2016 0357   GFRNONAA >60 12/27/2016 0357   GFRAA >60 12/27/2016 0357    GFR Estimated Creatinine Clearance: 46.2  mL/min (by C-G formula based on SCr of 0.61 mg/dL). No results for input(s): LIPASE, AMYLASE in the last 72 hours. No results for input(s): CKTOTAL, CKMB, CKMBINDEX, TROPONINI in the last 72 hours. Invalid input(s): POCBNP No results for input(s): DDIMER in the last 72 hours. No results for input(s): HGBA1C in the last 72 hours. No results for input(s): CHOL, HDL, LDLCALC, TRIG, CHOLHDL, LDLDIRECT in the last 72 hours. No results for input(s): TSH, T4TOTAL, T3FREE, THYROIDAB in the last 72 hours.  Invalid input(s): FREET3 No results for input(s): VITAMINB12, FOLATE, FERRITIN, TIBC, IRON, RETICCTPCT in the last 72 hours. Coags: No results for input(s): INR in the last 72 hours.  Invalid input(s): PT Microbiology: Recent Results (from the past 240 hour(s))  Surgical pcr screen     Status: None   Collection Time: 12/28/16  6:51 AM  Result Value Ref Range Status   MRSA, PCR NEGATIVE NEGATIVE Final   Staphylococcus aureus NEGATIVE NEGATIVE Final    Comment:        The Xpert SA Assay (FDA approved for NASAL specimens in patients over 79 years of age), is one component of a comprehensive surveillance program.  Test performance has been validated by [facility] for patients greater than or equal to 9 year old. It is not intended to diagnose infection nor to guide or monitor treatment.     FURTHER DISCHARGE INSTRUCTIONS:  Get Medicines reviewed and adjusted: Please take all your medications with you for your next visit with your Primary MD  Laboratory/radiological data: Please request your Primary MD to go over all hospital tests and procedure/radiological results at the follow up, please ask your Primary MD to get all Hospital records sent to his/her office.  In some cases, they will be blood work, cultures and biopsy results pending at the time of your discharge. Please request that your primary care M.D. goes through all the records of your hospital data and follows up on  these results.  Also Note the following: If you experience worsening of your admission symptoms, develop shortness of breath, life threatening emergency, suicidal or homicidal thoughts you must seek medical attention immediately by calling 911 or calling your MD immediately  if symptoms less severe.  You must read complete instructions/literature along with all the possible adverse reactions/side effects for all the Medicines  you take and that have been prescribed to you. Take any new Medicines after you have completely understood and accpet all the possible adverse reactions/side effects.   Do not drive when taking Pain medications or sleeping medications (Benzodaizepines)  Do not take more than prescribed Pain, Sleep and Anxiety Medications. It is not advisable to combine anxiety,sleep and pain medications without talking with your primary care practitioner  Special Instructions: If you have smoked or chewed Tobacco  in the last 2 yrs please stop smoking, stop any regular Alcohol  and or any Recreational drug use.  Wear Seat belts while driving.  Please note: You were cared for by a hospitalist during your hospital stay. Once you are discharged, your primary care physician will handle any further medical issues. Please note that NO REFILLS for any discharge medications will be authorized once you are discharged, as it is imperative that you return to your primary care physician (or establish a relationship with a primary care physician if you do not have one) for your post hospital discharge needs so that they can reassess your need for medications and monitor your lab values.  Total Time spent coordinating discharge including counseling, education and face to face time equals 45 minutes.  SignedJeoffrey Massed: Hareem Surowiec 12/30/2016 10:41 AM

## 2016-12-30 NOTE — Progress Notes (Signed)
[facility] Progress Note  Subjective  Doing ok  Pain improving Progressing well with therapies   ROS As above  Objective   BP 117/68 (BP Location: Right Arm)   Pulse 80   Temp 98.2 F (36.8 C) (Oral)   Resp 16   Ht 5\' 6"  (1.676 m)   Wt 54.9 kg (121 lb 0.5 oz)   SpO2 98%   BMI 19.54 kg/m   Intake/Output      01/30 0701 - 01/31 0700 01/31 0701 - 02/01 0700   P.O. 716    Total Intake(mL/kg) 716 (13)    Urine (mL/kg/hr) 700 (0.5)    Total Output 700     Net +16          Urine Occurrence 1 x      Labs  Results for [name] Weber, [name] (MRN [id number]) as of 12/30/2016 10:05  Ref. Range 12/29/2016 03:45  Vitamin D, 25-Hydroxy Latest Ref Range: 30.0 - 100.0 ng/mL 37.8    Exam  Gen: resting comfortably in bed, NAD Pelvis: tenderness persist with palpation of right hemipelvis, no gross motion  Ext:   Right Lower Extremity    Baseline ankle flexion contractures/foot drop unchanged   Exam stable   Motor and sensory functions at baseline    Ext warm   + DP pulses   Imaging   Follow up 3 v of pelvis does not demonstrate and further displacement of fracture   Assessment and Plan   POD/HD#: 594  83 y/o female s/p ground level fall with LC2 pelvic ring fracture    - R LC 2 pelvic ring fracture, baseline B foot drop/LEx weakness due to spinal bleed               continue with non-op tx  WBAT R leg  Must be in AFOs for ambulation   No ROM restrictions   Passive and active motion of R leg   PT/OT    Given baseline gait disorder/proprioceptive dysfunction, still believe pt is a very good candidate for CIR   She is progressing well with therapies and I believe that she will improve more rapidly with aggressive therapy in CIR. Concerned that a stay in the SNF would cause her to regress   Think pt would benefit from 7-10 day stay in inpatient rehab      - Pain management:             Tylenol             Low dose norco for severe pain    - ABL  anemia/Hemodynamics             Stable   - Medical issues              Per medical service                           Osteoporosis                         Would recommend repeat dexa as it has been 2 years since last scan                         Given hx of lumbar fractures and this pelvic fracture, along with previous dexa, pt would likely benefit from pharmacologic treatment of her osteoporosis  Pt has a PCP that is actively following, defer to PCP   Vitamin D levels are ok    - DVT/PE prophylaxis:             Per medical service    - Metabolic Bone Disease:             vitamin D levels are ok    - Activity:             PT/OT   WBAT with AFOs and assistance               - FEN/GI prophylaxis/Foley/Lines:             heart healthy diet    - Impediments to fracture healing:             Limited mobility              Osteoporosis   - Dispo:             continue OT/PT  ? Appeal medicare denial    Mearl Latin, PA-C [facility] [phone] (O) 12/30/2016 10:01 AM

## 2016-12-30 NOTE — Progress Notes (Signed)
Rehab admissions - I have a denial for acute inpatient rehab admission per [place] director.  I will inform the family and call the HCPOA around 10 am today.  Psychologist, occupational says needs can be met at a lower level of care such as SNF.  Call me for questions.  #[phone]

## 2016-12-30 NOTE — Progress Notes (Signed)
Occupational Therapy Treatment Patient Details Name: [name] Weber MRN: [id number] DOB: 11/15/1931 Today's Date: 12/30/2016    History of present illness  [name] Weber is a 83 y.o. female with history of A fib, T-9 paraplegia s/p thoracic laminectomy for abscess (off anticoagulation due to epidural hemorrhage), L 2 compression fracture--CIR stay 05/2016, anxiety disorder, bilateral foot drop managed by AFOs, gait disorder with ongoing outpatient therapy;  who was admitted on 12/26/16 after fall due to syncope and inability to walk. History taken from chart review and patient. She was found to have pelvic ring fracture as well as reports of new low back pain. Dr. Carola FrostHandy consulted and non-surgical management recommended. To be WBAT with B-AFOs and walker.   OT comments  Pt making good progress towards OT goals, continue plan of care for now. Continue to recommend CIR. If unable to go to CIR, recommend SNF for rehab. See below under ADL and exercises for skilled intervention this session.    Follow Up Recommendations  CIR;Supervision/Assistance - 24 hour    Equipment Recommendations   (defer to next venue)    Recommendations for Other Services Rehab consult    Precautions / Restrictions Precautions Precautions: Fall Required Braces or Orthoses: Other Brace/Splint Other Brace/Splint: Bil AFOs Restrictions Weight Bearing Restrictions: Yes RLE Weight Bearing: Weight bearing as tolerated LLE Weight Bearing: Weight bearing as tolerated    Mobility Bed Mobility General bed mobility comments: Pt found seated in recliner upon OT entering room  Transfers General transfer comment: Did not occur during OT session    Balance Overall balance assessment: Needs assistance;History of Falls Sitting-balance support: No upper extremity supported;Feet supported Sitting balance-Leahy Scale: Good Standing balance comment: did not occur    ADL Overall ADL's : Needs  assistance/impaired Eating/Feeding: Set up;Sitting   Grooming: Set up;Sitting   Upper Body Bathing: Min guard;Sitting   Lower Body Bathing: Maximal assistance;Sitting/lateral leans   Upper Body Dressing : Min guard;Sitting   Lower Body Dressing: Maximal assistance;Sitting/lateral leans General ADL Comments: Pt found seated in recliner. No transfer performed during this OT session as pt stated she was tired and sore from PT session earlier. Therapist worked with pt on ADL seated in recliner and UB exercises, see exercise information below.            Cognition   Behavior During Therapy: WFL for tasks assessed/performed Overall Cognitive Status: Within Functional Limits for tasks assessed      Exercises General Exercises - Upper Extremity Shoulder Flexion: AROM;15 reps;Seated;Both Shoulder Extension: AROM;15 reps;Both;Seated Shoulder ABduction: AROM;Both;Seated;15 reps Shoulder ADduction: AROM;Both;15 reps;Seated Other Exercises Other Exercises: Shoulder protraction/retraction, 15 reps, both, seated x3 sets of 15 for each exercise           Pertinent Vitals/ Pain       Pain Assessment: Faces Faces Pain Scale: Hurts even more Pain Location: R hip Pain Descriptors / Indicators: Aching;Grimacing;Guarding Pain Intervention(s): Monitored during session         Frequency  Min 2X/week     Progress Toward Goals  OT Goals(current goals can now be found in the care plan section)  Progress towards OT goals: Progressing toward goals  Acute Rehab OT Goals Patient Stated Goal: go to rehab OT Goal Formulation: With patient Time For Goal Achievement: 01/05/17 Potential to Achieve Goals: Good  Plan Discharge plan remains appropriate       End of Session   Activity Tolerance Patient tolerated treatment well   Patient Left in chair;with call bell/phone within reach  Time: 1610-9604 OT Time Calculation (min): 23 min  Charges: OT General Charges $OT Visit: 1  Procedure OT Treatments $Self Care/Home Management : 8-22 mins $Therapeutic Exercise: 8-22 mins  Edwin Cap , MS, OTR/L, [place] Pager: [phone] 12/30/2016, 1:50 PM

## 2016-12-30 NOTE — Clinical Social Work Note (Signed)
Clinical Social Worker facilitated patient discharge including contacting patient family and facility to confirm patient discharge plans.  Clinical information faxed to facility and family agreeable with plan.  CSW arranged ambulance transport via [facility] to [facility] at 3:30 .  RN to call [phone] for report prior to discharge.  Clinical Social Worker will sign off for now as social work intervention is no longer needed. Please consult us again if new need arises.  [address] Pfefferkorn [place], [place] [phone]

## 2016-12-31 ENCOUNTER — Ambulatory Visit: Payer: Medicare Other | Admitting: Physical Therapy

## 2017-01-05 ENCOUNTER — Encounter: Payer: Medicare Other | Admitting: Physical Medicine & Rehabilitation

## 2017-01-05 ENCOUNTER — Telehealth: Payer: Self-pay

## 2017-01-05 NOTE — Telephone Encounter (Signed)
I don't have control over who is [facility]. I wish I could help, but that is up to the admitting MD. I'm sorry she injured herself again.

## 2017-01-05 NOTE — Telephone Encounter (Signed)
Fell and fractured pelvis, Vi states that they have the approval for her to be hospitalized in [facility] but unable to obtain room due to avialiablity, is asking for assistance in obtaining a room for this patient in either [facility] or [facility]

## 2017-01-05 NOTE — Telephone Encounter (Signed)
Conveyed Doctor Swartz's advise.  Obvious disappointment expressed by pt's POA. She understood

## 2017-01-06 ENCOUNTER — Encounter: Payer: Self-pay | Admitting: *Deleted

## 2017-01-06 ENCOUNTER — Inpatient Hospital Stay (HOSPITAL_COMMUNITY)
Admission: RE | Admit: 2017-01-06 | Discharge: 2017-01-13 | DRG: 052 | Disposition: A | Discharge status: Home Health | Payer: Medicare Other | Source: Skilled Nursing Facility | Attending: Physical Medicine & Rehabilitation | Admitting: Physical Medicine & Rehabilitation

## 2017-01-06 ENCOUNTER — Other Ambulatory Visit (HOSPITAL_COMMUNITY): Payer: Self-pay | Admitting: Physician Assistant

## 2017-01-06 DIAGNOSIS — I8290 Acute embolism and thrombosis of unspecified vein: Secondary | ICD-10-CM | POA: Diagnosis not present

## 2017-01-06 DIAGNOSIS — G8220 Paraplegia, unspecified: Secondary | ICD-10-CM | POA: Diagnosis present

## 2017-01-06 DIAGNOSIS — M8080XA Other osteoporosis with current pathological fracture, unspecified site, initial encounter for fracture: Secondary | ICD-10-CM

## 2017-01-06 DIAGNOSIS — Z888 Allergy status to other drugs, medicaments and biological substances status: Secondary | ICD-10-CM

## 2017-01-06 DIAGNOSIS — D696 Thrombocytopenia, unspecified: Secondary | ICD-10-CM | POA: Diagnosis present

## 2017-01-06 DIAGNOSIS — K5900 Constipation, unspecified: Secondary | ICD-10-CM | POA: Diagnosis present

## 2017-01-06 DIAGNOSIS — R110 Nausea: Secondary | ICD-10-CM | POA: Diagnosis not present

## 2017-01-06 DIAGNOSIS — Z981 Arthrodesis status: Secondary | ICD-10-CM

## 2017-01-06 DIAGNOSIS — S32810S Multiple fractures of pelvis with stable disruption of pelvic ring, sequela: Secondary | ICD-10-CM | POA: Diagnosis not present

## 2017-01-06 DIAGNOSIS — Z87891 Personal history of nicotine dependence: Secondary | ICD-10-CM

## 2017-01-06 DIAGNOSIS — I4891 Unspecified atrial fibrillation: Secondary | ICD-10-CM | POA: Diagnosis present

## 2017-01-06 DIAGNOSIS — S32810A Multiple fractures of pelvis with stable disruption of pelvic ring, initial encounter for closed fracture: Secondary | ICD-10-CM

## 2017-01-06 DIAGNOSIS — M21372 Foot drop, left foot: Secondary | ICD-10-CM | POA: Diagnosis present

## 2017-01-06 DIAGNOSIS — M810 Age-related osteoporosis without current pathological fracture: Secondary | ICD-10-CM | POA: Diagnosis present

## 2017-01-06 DIAGNOSIS — S329XXD Fracture of unspecified parts of lumbosacral spine and pelvis, subsequent encounter for fracture with routine healing: Secondary | ICD-10-CM

## 2017-01-06 DIAGNOSIS — G959 Disease of spinal cord, unspecified: Secondary | ICD-10-CM | POA: Diagnosis present

## 2017-01-06 DIAGNOSIS — S3282XS Multiple fractures of pelvis without disruption of pelvic ring, sequela: Secondary | ICD-10-CM | POA: Diagnosis not present

## 2017-01-06 DIAGNOSIS — I824Z1 Acute embolism and thrombosis of unspecified deep veins of right distal lower extremity: Secondary | ICD-10-CM | POA: Diagnosis not present

## 2017-01-06 DIAGNOSIS — I82491 Acute embolism and thrombosis of other specified deep vein of right lower extremity: Secondary | ICD-10-CM | POA: Diagnosis present

## 2017-01-06 DIAGNOSIS — S32810D Multiple fractures of pelvis with stable disruption of pelvic ring, subsequent encounter for fracture with routine healing: Secondary | ICD-10-CM

## 2017-01-06 DIAGNOSIS — E8809 Other disorders of plasma-protein metabolism, not elsewhere classified: Secondary | ICD-10-CM | POA: Diagnosis present

## 2017-01-06 DIAGNOSIS — M21371 Foot drop, right foot: Secondary | ICD-10-CM | POA: Diagnosis present

## 2017-01-06 DIAGNOSIS — W19XXXD Unspecified fall, subsequent encounter: Secondary | ICD-10-CM | POA: Diagnosis present

## 2017-01-06 DIAGNOSIS — M8000XA Age-related osteoporosis with current pathological fracture, unspecified site, initial encounter for fracture: Secondary | ICD-10-CM

## 2017-01-06 DIAGNOSIS — G839 Paralytic syndrome, unspecified: Secondary | ICD-10-CM | POA: Diagnosis not present

## 2017-01-06 DIAGNOSIS — G8929 Other chronic pain: Secondary | ICD-10-CM

## 2017-01-06 DIAGNOSIS — K5901 Slow transit constipation: Secondary | ICD-10-CM

## 2017-01-06 DIAGNOSIS — M545 Low back pain: Secondary | ICD-10-CM

## 2017-01-06 DIAGNOSIS — I482 Chronic atrial fibrillation: Secondary | ICD-10-CM | POA: Diagnosis not present

## 2017-01-06 HISTORY — DX: Disease of spinal cord, unspecified: G95.9

## 2017-01-06 MED ORDER — SORBITOL 70 % SOLN: 30.0000 mL | Freq: Every day | Status: DC | PRN

## 2017-01-06 MED ORDER — CALCITONIN (SALMON) 200 UNIT/ACT NA SOLN
1.0000 | Freq: Every day | NASAL | Status: DC
  Administered 2017-01-07 – 2017-01-13 (×7): 1 via NASAL
  Filled 2017-01-06: qty 3.7

## 2017-01-06 MED ORDER — DIGOXIN 125 MCG PO TABS
0.1250 mg | ORAL_TABLET | Freq: Every day | ORAL | Status: DC
  Administered 2017-01-07 – 2017-01-13 (×7): 0.125 mg via ORAL
  Filled 2017-01-06 (×7): qty 1

## 2017-01-06 MED ORDER — ONDANSETRON HCL 4 MG/2ML IJ SOLN
4.0000 mg | Freq: Four times a day (QID) | INTRAMUSCULAR | Status: DC | PRN
Start: 2017-01-06 — End: 2017-01-13

## 2017-01-06 MED ORDER — POLYETHYLENE GLYCOL 3350 17 G PO PACK
17.0000 g | PACK | Freq: Every day | ORAL | Status: DC
Start: 2017-01-06 — End: 2017-01-13
  Filled 2017-01-06 (×5): qty 1

## 2017-01-06 MED ORDER — CALCIUM CARBONATE-VITAMIN D 500-200 MG-UNIT PO TABS
1.0000 | ORAL_TABLET | Freq: Every day | ORAL | Status: DC
  Administered 2017-01-07 – 2017-01-13 (×7): 1 via ORAL
  Filled 2017-01-06 (×7): qty 1

## 2017-01-06 MED ORDER — ACETAMINOPHEN 325 MG PO TABS
325.0000 mg | ORAL_TABLET | ORAL | Status: DC | PRN
  Administered 2017-01-06 – 2017-01-12 (×13): 650 mg via ORAL
  Filled 2017-01-06 (×13): qty 2

## 2017-01-06 MED ORDER — ASPIRIN EC 81 MG PO TBEC
81.0000 mg | DELAYED_RELEASE_TABLET | Freq: Every day | ORAL | Status: DC
  Administered 2017-01-07 – 2017-01-13 (×7): 81 mg via ORAL
  Filled 2017-01-06 (×7): qty 1

## 2017-01-06 MED ORDER — MAGNESIUM OXIDE 400 (241.3 MG) MG PO TABS
400.0000 mg | ORAL_TABLET | Freq: Every day | ORAL | Status: DC
  Administered 2017-01-07 – 2017-01-13 (×7): 400 mg via ORAL
  Filled 2017-01-06 (×7): qty 1

## 2017-01-06 MED ORDER — HYDROCODONE-ACETAMINOPHEN 5-325 MG PO TABS
2.0000 | ORAL_TABLET | Freq: Four times a day (QID) | ORAL | Status: DC | PRN
  Filled 2017-01-06: qty 2

## 2017-01-06 MED ORDER — LATANOPROST 0.005 % OP SOLN
1.0000 [drp] | Freq: Every day | OPHTHALMIC | Status: DC
  Administered 2017-01-06 – 2017-01-12 (×7): 1 [drp] via OPHTHALMIC
  Filled 2017-01-06: qty 2.5

## 2017-01-06 MED ORDER — ONDANSETRON HCL 4 MG PO TABS: 4.0000 mg | ORAL_TABLET | Freq: Four times a day (QID) | ORAL | Status: DC | PRN

## 2017-01-06 NOTE — Progress Notes (Signed)
Ankit Karis Juba, MD Physician Signed Physical Medicine and Rehabilitation  Consult Note Date of Service: 12/28/2016 11:29 AM  Related encounter: ED to Hosp-Admission (Discharged) from 12/26/2016 in [facility]     Expand All Collapse All   [] Hide copied text [] Hover for attribution information      Physical Medicine and Rehabilitation Consult   Reason for Consult: Pelvic fractures in setting of fall and gait disorder Referring Physician: Dr. Carola Frost   HPI: [name] Weber is a 83 y.o. female with history of A fib, T-9 paraplegia s/p thoracic laminectomy for abscess (off anticoagulation due to epidural hemorrhage), L 2 compression fracture--CIR stay 05/2016, anxiety disorder, bilateral foot drop managed by AFOs, gait disorder with ongoing outpatient therapy;  who was admitted on 12/26/16 after fall due to syncope and inability to walk. History taken from chart review and patient. She was found to have pelvic ring fracture as well as reports of new low back pain. Dr. Carola Frost consulted and non-surgical management recommended. To be WBAT with B-AFOs and walker. Therapy evaluations to be done today and CIR consulted for follow up therapy.   Patient discharged to SNF after CIR stay due to lack of social supports. She has been going to outpatient PT twice a week since Oct.   Review of Systems  HENT: Negative for hearing loss and tinnitus.   Eyes: Negative for blurred vision and double vision.  Respiratory: Negative for cough and shortness of breath.   Cardiovascular: Negative for chest pain and palpitations.  Gastrointestinal: Negative for constipation, heartburn and nausea.  Genitourinary: Negative for dysuria and urgency.  Musculoskeletal: Positive for back pain and myalgias.  Skin: Negative for itching and rash.  Neurological: Negative for dizziness and headaches.  Psychiatric/Behavioral: Negative for depression. The patient is nervous/anxious.     All other systems reviewed and are negative.        Past Medical History:  Diagnosis Date  . Fatigue   . Malaise   . Myalgia   . PAC (premature atrial contraction)    ISOLATED  . Palpitations    OCCASSIONAL  . Paroxysmal atrial flutter (HCC)   . PVC's (premature ventricular contractions)    ISOLATED         Past Surgical History:  Procedure Laterality Date  . THORACIC LAMINECTOMY FOR EPIDURAL ABSCESS N/A 11/16/2014   Procedure: THORACIC LAMINECTOMY FOR EPIDURAL ABSCESS;  Surgeon: Karn Cassis, MD;  Location: [facility];  Service: Neurosurgery;  Laterality: N/A;  . VULVAR LESION REMOVAL  01/01/2009   She had a 1-cm area of erythema to the right of the urethral meatus         Family History  Problem Relation Age of Onset  . Hyperlipidemia Mother     Social History:  Lives alone. Retired --use to work for [employer]. She was independent with walker and AFOs. Family assists with home management and shopping. reports that she quit smoking about 25 years ago. She has quit using smokeless tobacco. She reports that she does not drink alcohol or use drugs.          Allergies  Allergen Reactions  . Crestor [Rosuvastatin Calcium] Other (See Comments)    "bones ache all over"  . Rosuvastatin Other (See Comments)    "bones ache all over" Bone pain all over body          Medications Prior to Admission  Medication Sig Dispense Refill  . aspirin EC 81 MG EC tablet  Take 1 tablet (81 mg total) by mouth daily.    . Calcium Carb-Cholecalciferol (CALCIUM 600 + D PO) Take 1 tablet by mouth daily.     . digoxin (LANOXIN) 0.125 MG tablet TAKE 1 TABLET BY MOUTH DAILY 90 tablet 3  . latanoprost (XALATAN) 0.005 % ophthalmic solution Place 1 drop into both eyes at bedtime.    . Magnesium 400 MG CAPS Take 400 mg by mouth daily.     . Omega-3 Fatty Acids (FISH OIL ULTRA) 1400 MG CAPS Take 1,400 mg by mouth daily.     . Turmeric 500 MG CAPS  Take 500 mg by mouth daily.       Home: Home Living Family/patient expects to be discharged to:: Private residence Living Arrangements: Alone  Functional History: Functional Status:  Mobility:  ADL:  Cognition:  Blood pressure (!) 150/78, pulse 80, temperature 98.9 F (37.2 C), temperature source Oral, resp. rate 16, height 5\' 6"  (1.676 m), weight 54.9 kg (121 lb 0.5 oz), SpO2 97 %. Physical Exam  Nursing note and vitals reviewed. Constitutional: She is oriented to person, place, and time. She appears well-developed. She appears cachectic. She is cooperative.  Frail appearing elderly female.   HENT:  Head: Normocephalic and atraumatic.  Mouth/Throat: Oropharynx is clear and moist.  Eyes: Conjunctivae and EOM are normal. Pupils are equal, round, and reactive to light. No scleral icterus.  Neck: Normal range of motion. Neck supple.  Cardiovascular:  Irregularly irregular  Respiratory: Effort normal and breath sounds normal. No stridor. No respiratory distress.  GI: Soft. Bowel sounds are normal. She exhibits no distension. There is no tenderness.  Musculoskeletal: She exhibits edema and tenderness.  Bilateral foot drop with contractures.  Pain with attempts at ROM right > LLE.   Neurological: She is alert and oriented to person, place, and time.  Speech clear.  Follows commands without difficulty.  Sensation intact to light touch Motor: B/l UE 4/5 proximal to distal RLE: HF, KE 3/5, ADF/PF 2/5 with contracture LLE: HF, KE 4-/5, ADF/PF 2/5 with contracture  Skin: Skin is warm and dry. She is not diaphoretic.  Psychiatric: She has a normal mood and affect. Her behavior is normal. Judgment and thought content normal.    Lab Results Last 24 Hours       Results for orders placed or performed during the hospital encounter of 12/26/16 (from the past 24 hour(s))  Surgical pcr screen     Status: None   Collection Time: 12/28/16  6:51 AM  Result Value Ref Range    MRSA, PCR NEGATIVE NEGATIVE   Staphylococcus aureus NEGATIVE NEGATIVE  CBC     Status: Abnormal   Collection Time: 12/28/16  7:04 AM  Result Value Ref Range   WBC 7.5 4.0 - 10.5 K/uL   RBC 4.25 3.87 - 5.11 MIL/uL   Hemoglobin 13.7 12.0 - 15.0 g/dL   HCT 16.1 09.6 - 04.5 %   MCV 99.5 78.0 - 100.0 fL   MCH 32.2 26.0 - 34.0 pg   MCHC 32.4 30.0 - 36.0 g/dL   RDW 40.9 81.1 - 91.4 %   Platelets 146 (L) 150 - 400 K/uL     Imaging Results (Last 48 hours)  No results found.    Assessment/Plan: Diagnosis: Pelvic fractures Labs and images independently reviewed.  Records reviewed and summated above.  1. Does the need for close, 24 hr/day medical supervision in concert with the patient's rehab needs make it unreasonable for this patient to be served  in a less intensive setting? Potentially  2. Co-Morbidities requiring supervision/potential complications: A fib (monitor HR with increased physical activity), T-9 paraplegia s/p thoracic laminectomy for abscess (off anticoagulation due to epidural hemorrhage), L 2 compression fracture, anxiety disorder (meds as needed), bilateral foot drop managed by AFOs, HTN (monitor and provide prns in accordance with increased physical exertion and pain), pain (Biofeedback training with therapies to help reduce reliance on opiate pain medications, monitor pain control during therapies, and sedation at rest and titrate to maximum efficacy to ensure participation and gains in therapies), hypoalbuminemia (maximize nutrition for overall health and wound healing), Thrombocytopenia (< 60,000/mm3 no resistive exercise), pulm HTN (Monitor in accordance with increased physical activity and avoid UE resistance excercises), severe osteoporosis with pathological fractures (consider scan, meds) 3. Due to bladder management, bowel management, safety, disease management, pain management and patient education, does the patient require 24 hr/day rehab nursing?  Yes 4. Does the patient require coordinated care of a physician, rehab nurse, PT (1-2 hrs/day, 5 days/week) and OT (1-2 hrs/day, 5 days/week) to address physical and functional deficits in the context of the above medical diagnosis(es)? Potentially Addressing deficits in the following areas: balance, endurance, locomotion, strength, transferring, bathing, dressing, toileting and psychosocial support 5. Can the patient actively participate in an intensive therapy program of at least 3 hrs of therapy per day at least 5 days per week? Potentially 6. The potential for patient to make measurable gains while on inpatient rehab is TBD 7. Anticipated functional outcomes upon discharge from inpatient rehab are TBD  with PT, TBD with OT, n/a with SLP. 8. Estimated rehab length of stay to reach the above functional goals is: TBD. 9. Does the patient have adequate social supports and living environment to accommodate these discharge functional goals? Potentially 10. Anticipated D/C setting: Home 11. Anticipated post D/C treatments: HH therapy and Home excercise program 12. Overall Rehab/Functional Prognosis: good  RECOMMENDATIONS: This patient's condition is appropriate for continued rehabilitative care in the following setting: Will need to await evaluations by therapies to determine most appropriate discharge destination once medical workup and final plan has been completed. If it appears pt may be able to reach an indepenent level of functioning, would recommend CIR, otherwise SNF given falls and recurrent fractures. Patient has agreed to participate in recommended program. Potentially Note that insurance prior authorization may be required for reimbursement for recommended care.  Comment: Rehab Admissions Coordinator to follow up.  Maryla MorrowAnkit Patel, MD, [place] DomFAAPMR Jacquelynn CreeLove, Pamela S, [place]PA-C 12/28/2016    Revision History

## 2017-01-06 NOTE — Progress Notes (Signed)
Patient was informed about rehab process including patient safety plan and rehab booklet. 

## 2017-01-06 NOTE — H&P (Signed)
Physical Medicine and Rehabilitation Admission H&P    Chief Complaint  Patient presents with  . Fall  : HPI: 83 year old right-handed female with history of atrial fibrillation, T9 paraplegia/myelopathy status post thoracic laminectomy for abscess off anticoagulation due to epidural hemorrhage and received CIR 11/29/2014-12/17/2014 and [facility] to [facility], L2 compression fracture received CIR and again received inpatient rehabilitation services 06/08/2016-06/19/2016 and discharge to [facility] and had since returned home where she was using a walker for mobility. She was able to complete her ADLs. History taken from chart review and patient. Patient lives alone. She [place] a son in [place]. Neighbors with good support that bring her to the grocery store as well as doctor appointments. Patient with chronic bilateral foot drop managed by AFOs, gait disorder and had been receiving ongoing outpatient therapies. Recent admission to [facility] 12/26/2016 after a fall due to question syncope. Found to have pelvic ring fracture and was evaluated by orthopedic services Dr. Carola FrostHandy who advised nonsurgical management weightbearing as tolerated. Hip xray 1/31 reviewed, showing osteopenia and stable fracture. She received evaluations from both physical and occupational therapy. Moderate assist for stand pivot transfers as well as moderate assist sit to stand. She was denied by insurance for inpatient rehabilitation services and discharged [facility] 12/30/2016. Expedited appeal by her insurance excepting patient to CIR. She was admitted for a comprehensive rehabilitation program.  Review of Systems  Constitutional: Positive for malaise/fatigue. Negative for chills and fever.  HENT: Negative for hearing loss and tinnitus.   Eyes: Negative for blurred vision, double vision and pain.  Respiratory: Negative for cough and  shortness of breath.   Cardiovascular: Negative for chest pain.  Gastrointestinal: Positive for constipation. Negative for nausea and vomiting.  Genitourinary: Negative for dysuria, flank pain and hematuria.  Musculoskeletal: Positive for back pain, falls and myalgias.  Skin: Negative for rash.  Neurological: Positive for weakness. Negative for seizures.  All other systems reviewed and are negative.  Past Medical History:  Diagnosis Date  . Fatigue   . Malaise   . Myalgia   . PAC (premature atrial contraction)    ISOLATED  . Palpitations    OCCASSIONAL  . Paroxysmal atrial flutter (HCC)   . PVC's (premature ventricular contractions)    ISOLATED   Past Surgical History:  Procedure Laterality Date  . THORACIC LAMINECTOMY FOR EPIDURAL ABSCESS N/A 11/16/2014   Procedure: THORACIC LAMINECTOMY FOR EPIDURAL ABSCESS;  Surgeon: Karn CassisErnesto M Botero, MD;  Location: [facility];  Service: Neurosurgery;  Laterality: N/A;  . VULVAR LESION REMOVAL  01/01/2009   She had a 1-cm area of erythema to the right of the urethral meatus   Family History  Problem Relation Age of Onset  . Hyperlipidemia Mother    Social History:  reports that she quit smoking about 25 years ago. She has quit using smokeless tobacco. She reports that she does not drink alcohol or use drugs. Allergies:  Allergies  Allergen Reactions  . Crestor [Rosuvastatin Calcium] Other (See Comments)    "bones ache all over"  . Rosuvastatin Other (See Comments)    "bones ache all over" Bone pain all over body   No prescriptions prior to admission.    Home: Home Living Family/patient expects to be discharged to:: Private residence Living Arrangements: Alone Available Help at Discharge: Family, Friend(s), Available PRN/intermittently Type of Home: Apartment Home Access: Other (comment) (stair lift) Home Layout: One level Bathroom Shower/Tub: Walk-in shower  Bathroom Toilet: Handicapped height Bathroom Accessibility:  Yes Home Equipment: Environmental consultant - 4 wheels, Walker - 2 wheels, Toilet riser, Shower seat, Hand held shower head, Grab bars - tub/shower, Mudlogger: Reacher, Sock aid, Long-handled shoe horn   Functional History: Prior Function Level of Independence: Independent with assistive device(s) Comments: Pt uses RW indoors and rollator outdoors for ambulation PTA. Has Bil  AFOs. Drives, cooks.   Functional Status:  Mobility: Bed Mobility Overal bed mobility: Needs Assistance Bed Mobility: Sit to Supine Supine to sit: Mod assist, +2 for physical assistance, HOB elevated Sit to supine: +2 for physical assistance, Mod assist General bed mobility comments: Pt found seated in recliner upon OT entering room Transfers Overall transfer level: Needs assistance Equipment used:  (STEDY) Transfer via Lift Equipment: Stedy Transfers: Sit to/from Stand Sit to Stand: Mod assist Stand pivot transfers: Mod assist General transfer comment: Did not occur during OT session Ambulation/Gait Ambulation/Gait assistance: Min assist, +2 safety/equipment Ambulation Distance (Feet): 10 Feet Assistive device: Rolling walker (2 wheeled) Gait Pattern/deviations: Step-through pattern, Decreased stance time - right, Decreased step length - left, Decreased step length - right, Decreased weight shift to right, Antalgic General Gait Details: cues for sequencing of gait with use of AD and vc and assist to weight shift and advance RW     ADL: ADL Overall ADL's : Needs assistance/impaired Eating/Feeding: Set up, Sitting Grooming: Set up, Sitting Upper Body Bathing: Min guard, Sitting Lower Body Bathing: Maximal assistance, Sitting/lateral leans Upper Body Dressing : Min guard, Sitting Lower Body Dressing: Maximal assistance, Sitting/lateral leans Toilet Transfer: Moderate assistance, Maximal assistance, RW, Ambulation, BSC, Cueing for safety, Cueing for sequencing Toileting- Clothing Manipulation  and Hygiene: Maximal assistance Tub/ Shower Transfer: Moderate assistance, Maximal assistance, 3 in 1, Rolling walker, Ambulation Functional mobility during ADLs: Moderate assistance, Maximal assistance, Cueing for safety, Rolling walker, Cueing for sequencing General ADL Comments: Pt found seated in recliner. No transfer performed during this OT session as pt stated she was tired and sore from PT session earlier. Therapist worked with pt on ADL seated in recliner and UB exercises, see exercise information below.   Cognition: Cognition Overall Cognitive Status: Within Functional Limits for tasks assessed Cognition Arousal/Alertness: Awake/alert Behavior During Therapy: WFL for tasks assessed/performed Overall Cognitive Status: Within Functional Limits for tasks assessed  Physical Exam: Blood pressure 96/69, pulse 90, temperature 97.8 F (36.6 C), temperature source Oral, resp. rate 17, height 5\' 6"  (1.676 m), weight 54.9 kg (121 lb 0.5 oz), SpO2 97 %. Physical Exam  Vitals reviewed. Constitutional: She is oriented to person, place, and time. She appears well-developed.  Frail  HENT:  Head: Normocephalic and atraumatic.  Eyes: Conjunctivae and EOM are normal. Left eye exhibits no discharge.  Neck: Normal range of motion. Neck supple. No thyromegaly present.  Cardiovascular:  Irregularly irregular  Respiratory: Effort normal and breath sounds normal. No respiratory distress.  GI: Soft. Bowel sounds are normal. She exhibits no distension. There is no tenderness.  Musculoskeletal: She exhibits tenderness. She exhibits no edema.  Bilateral foot drop with contractures.  Pain with range of motion right greater than left hip.   Neurological: She is alert and oriented to person, place, and time.  Follows commands Sensation intact to light touch Speech clear.  Motor: B/l UE 5/5 proximal to distal RLE: HF 2/5, KE 4/5, ADF/PF 2/5 with contracture LLE: HF 2/5, KE 4+/5, ADF/PF 2/5 with  contracture   Skin: Skin is warm and dry.  Psychiatric: She has a  normal mood and affect. Her behavior is normal. Thought content normal.    No results found for this or any previous visit (from the past 48 hour(s)). No results found.  Medical Problem List and Plan: 1.  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 Weightbearing as tolerated. AFO braces.  Cont AFOs 2.  DVT Prophylaxis/Anticoagulation: SCDs. Check vascular study 3. Pain Management: Hydrocodone as needed. 4. Mood: Provide emotional support 5. Neuropsych: This patient is capable of making decisions on her own behalf. 6. Skin/Wound Care: Routine skin checks 7. Fluids/Electrolytes/Nutrition: Routine I&O with follow-up chemistries 8. Atrial fibrillation. Lanoxin 0.125 mg daily. Cardiac rate control. Continue aspirin therapy 9. Constipation. Laxative assistance 10. Hypoalbuminemia: Albumin ordered  11. Thrombocytopenia: Follow CBC 12. Severe osteoporosis: Monitor for further pathological fractures   Post Admission Physician Evaluation: 1. Preadmission assessment reviewed and changes made below. 2. Functional deficits secondary  to Pelvic fractures with history of myelopathy. 3. Patient is admitted to receive collaborative, interdisciplinary care between the physiatrist, rehab nursing staff, and therapy team. 4. Patient's level of medical complexity and substantial therapy needs in context of that medical necessity cannot be provided at a lesser intensity of care such as a SNF. 5. Patient has experienced substantial functional loss from his/her baseline which was documented above under the "Functional History" and "Functional Status" headings.  Judging by the patient's diagnosis, physical exam, and functional history, the patient has potential for functional progress which will result in measurable gains while on inpatient  rehab.  These gains will be of substantial and practical use upon discharge  in facilitating mobility and self-care at the household level. 6. Physiatrist will provide 24 hour management of medical needs as well as oversight of the therapy plan/treatment and provide guidance as appropriate regarding the interaction of the two. 7. The Preadmission Screening has been reviewed and patient status is unchanged unless otherwise stated above. 8. 24 hour rehab nursing will assist with safety, disease management, pain management and patient education  and help integrate therapy concepts, techniques,education, etc. 9. PT will assess and treat for/with: Lower extremity strength, range of motion, stamina, balance, functional mobility, safety, adaptive techniques and equipment, coping skills, pain control, education.  Goals are: Supervision/Min A. 10. OT will assess and treat for/with: ADL's, functional mobility, safety, upper extremity strength, adaptive techniques and equipment, ego support, and community reintegration.   Goals are: Supervision/Min A. Therapy may proceed with showering this patient. 11. Case Management and Social Worker will assess and treat for psychological issues and discharge planning. 12. Team conference will be held weekly to assess progress toward goals and to determine barriers to discharge. 13. Patient will receive at least 3 hours of therapy per day at least 5 days per week. 14. ELOS: 13-18 days. 15. Prognosis:  good  Maryla Morrow, MD, [place] Amor., PA-C 01/06/2017

## 2017-01-06 NOTE — PMR Pre-admission (Signed)
[facility] PMR Admission Coordinator Pre-Admission Assessment  Patient: [name] Weber is an 83 y.o., female MRN: [id number] DOB: 29-Jun-1931 Height: '5\' 6"'  (167.6 cm) Weight: 54.9 kg (121 lb 0.5 oz)  Insurance Information HMO: Yes   PPO:       PCP:       IPA:       80/20:       OTHER:  Group # [id number] PRIMARY: [place]      Policy#: [id number]      Subscriber:  [name] CM Name: Sherlynn Stalls      Phone#: [phone]     Fax#:  [phone] Pre-Cert#:  [id number]      Employer:  Retired Benefits:  Phone #: [phone]     Name:  Hanley Seamen. Date: 11/30/16     Deduct:  $0      Out of Pocket Max: $4500 (met $40)      Life Max:  unlimited CIR: $345 days 1-5      SNF: $0 days 1-20; $160 days 21-49; $0 days 50-100 Outpatient: medical necessity     Co-Pay: $40/visit Home Health: 100%      Co-Pay: none DME: 80%     Co-Pay: 20% Providers: in network  Medicaid Application Date:        Case Manager:   Disability Application Date:        Case Worker:    Emergency Contact Information Contact Information    Name Relation Home Work [place]   Davis   [phone]   Lusero, Nordlund   [phone]   Defrain,Fay Other   [phone]   Johnnette Litter Sister [phone]     Oren Section Niece [phone]        Current Medical History  Patient Admitting Diagnosis: Pelvic fractures  History of Present Illness: An 83 y.o. female with history of A fib, T-9 paraplegia s/p thoracic laminectomy for abscess (off anticoagulation due to epidural hemorrhage), L 2 compression fracture--CIR stay 05/2016, anxiety disorder, bilateral foot drop managed by AFOs, gait disorder with ongoing outpatient therapy;  who was admitted to [facility] on 12/26/16 after fall due to syncope and inability to walk. History taken from chart review and patient. She was found to have pelvic ring fracture as well as reports of new low back pain. Dr. Marcelino Scot consulted  and non-surgical management recommended. To be WBAT with B-AFOs and walker. Therapy evaluations completed and CIR consulted for follow up therapy.   Patient discharged to SNF after CIR stay in 07/17 due to lack of social supports. She has been going to outpatient PT twice a week since Oct. 2017.  Patient was medically ready for discharge on 12/30/16 and went to [facility] for continued therapies.  We had received a denial for acute inpatient rehab admission.  The denial was overturned on expedited appeal.  We are re-admitting to acute inpatient rehab today from [facility].   Patient's medical record from [facility] has been reviewed by the rehabilitation admission coordinator and physician.  Past Medical History  Past Medical History:  Diagnosis Date  . Fatigue   . Malaise   . Myalgia   . PAC (premature atrial contraction)    ISOLATED  . Palpitations    OCCASSIONAL  . Paroxysmal atrial flutter ([place])   . PVC's (premature ventricular contractions)    ISOLATED    Family History   family history includes Hyperlipidemia in her mother.  Prior Rehab/Hospitalizations Has the patient  had major surgery during 100 days prior to admission? No.  Was recently on CIR in July 2017 and then at [facility] SNF after CIR.  Was discharged from [facility] on 12/30/16.  Was going to [place] outpatient therapy 2 X a week.   Current Medications Aspirin, calcium, digoxin, fish oil, norco, xalatan, magnesium, miralax, tumeric  Patients Current Diet:   Low sodium diet  Precautions / Restrictions Precautions Precautions: Fall Other Brace/Splint: Bil AFOs Restrictions Weight Bearing Restrictions: Yes RLE Weight Bearing: Weight bearing as tolerated LLE Weight Bearing: Weight bearing as tolerated   Has the patient had 2 or more falls or a fall with injury in the past year?Yes.  Had 1 fall in the bathroom which resulted in current pelvic fracture.  Prior Activity Level Community (5-7x/wk):  Went to get hair done, visit friends, went to MD office,  Not driving since 08/01/41  Prior Functional Level Self Care: Did the patient need help bathing, dressing, using the toilet or eating?  Independent  Indoor Mobility: Did the patient need assistance with walking from room to room (with or without device)? Independent  Stairs: Did the patient need assistance with internal or external stairs (with or without device)? Needed some help  Functional Cognition: Did the patient need help planning regular tasks such as shopping or remembering to take medications? [place] Devices/Equipment: Environmental consultant (specify type), Other (Comment) Home Equipment: Walker - 2 wheels, Walker - 4 wheels, Shower seat, Toilet riser, Grab bars - tub/shower, Hand held shower head, Adaptive equipment  Prior Device Use: Indicate devices/aids used by the patient prior to current illness, exacerbation or injury? Mechanical lift, Walker and rollator.   Prior Functional Level Current Functional Level  Bed Mobility  Independent  Mod assist   Transfers  Independent  Mod assist   Mobility - Walk/Wheelchair  Independent  Min assist (Ambulated 10 feet RW.)   Upper Body Dressing  Independent  Min assist   Lower Body Dressing  Independent  Max assist   Grooming  Independent  Other (Set up)   Eating/Drinking  Independent  Other (Set up)   Toilet Transfer  Independent  Mod assist   Bladder Continence   Depends at night form incontinence  Wears depends at night for incontinence   Bowel Management  Last BM 01/05/17  Last BM 01/05/17   Stair Climbing  Needs assistance Other (Not tried.)   Communication  Verbal and intact, but very HOH  Verbal and intact.  Very HOH and wears B hearing aides   Memory  Mild impairment  mild impairment   Cooking/Meal Prep  Some assistance from friends      Housework  Hired help on a weekly basis    Money  Management  Independent but has 2 friends who are HCPOA and they assist when needed.    Driving  Not driving since 70/34.     Special needs/care consideration BiPAP/CPAP No CPM No Continuous Drip IV No Dialysis No         Life Vest No Oxygen No Special Bed No Trach Size No Wound Vac (area) No     Skin No                            Bowel mgmt: Last BM 01/05/17 Bladder mgmt: Wears depends at night for incontinence Diabetic mgmt No  Previous Home Environment Living Arrangements: Alone  Lives With: Alone Available Help at  Discharge: Family, Friend(s), Available PRN/intermittently Type of Home: Condominum Home Layout: One level Home Access: Other (comment) (Stair lift) From garage where there are 6 steps. Bathroom Shower/Tub: Multimedia programmer: Handicapped height Bathroom Accessibility: Yes How Accessible: Accessible via walker Home Care Services: No  Discharge Living Setting Plans for Discharge Living Setting: Alone, Condominium Type of Home at Discharge: Condominium Discharge Home Layout: One level Discharge Home Access: Other (comment) (Stair lift from garage entry.)  6 steps at garage entry. Does the patient have any problems obtaining your medications?: No  Social/Family/Support Systems Patient Roles: Parent, Other (Comment) Contact Information: Via Lawana Chambers - friend and HCPOA Anticipated Caregiver: Self and friends.  Has a son who lives in [place]. Anticipated Caregiver's Contact Information: Via - friend - [phone] Ability/Limitations of Caregiver: Friends works.  Via and her sister live next door. Caregiver Availability: Intermittent Discharge Plan Discussed with Primary Caregiver: Yes Is Caregiver In Agreement with Plan?: Yes Does Caregiver/Family have Issues with Lodging/Transportation while Pt is in Rehab?: No  Goals/Additional Needs Patient/Family Goal for Rehab: PT/OT mod I goals Expected length of stay: 7-10 days Cultural  Considerations: Attends church on Sundays Dietary Needs: Low Sodium diet Equipment Needs: Has hearing aides.  Very HOH.Marland Kitchen Special Service Needs: Patient was discharged to [facility] after we received a denial from [facility] last week.  An expediated appeal was completed and the denial was overturned.  Today is the first day that we have had an available bed on inpatient rehab.  The patient prefers to admit to inpatient rehab rather than remain at [facility] at this time. Additional Information: Sees Dr. Naaman Plummer in the outpatient setting Pt/Family Agrees to Admission and willing to participate: Yes Program Orientation Provided & Reviewed with Pt/Caregiver Including Roles  & Responsibilities: Yes  Patient Condition: Patient was discharged to [facility] on 12/30/16.  Since then I have spoken to [place] several times with her requesting acute inpatient rehab admission.  Via and the patient feel patient will progress better and faster with intense therapies.  Patient has been to inpatient rehab previously and she did very well.  With 3 hours of therapy a day, the hope is that she can reach mod I goals and live alone once again upon discharge, but this may be difficult.  The patient has been participating with therapies in the SNF, but feels she needs the 3 hours of therapy a day in order to regain function.  I have approval and will re-admit to acute inpatient rehab today.  Preadmission Screen Completed By:  [name], 01/06/2017 11:49 AM ______________________________________________________________________   Discussed status with Dr. Posey Pronto on  01/06/17 at 1219 and received telephone approval for admission today.  Admission Coordinator:  [name], time 1229/Date 01/06/17   Assessment/Plan: Diagnosis: Pelvic fractures 1. Does the need for close, 24 hr/day  Medical supervision in concert with the patient's rehab needs make it unreasonable for this patient to be served in a less  intensive setting? Potentially 2. Co-Morbidities requiring supervision/potential complications: A fib (monitor HR with increased physical activity), T-9 paraplegia s/p thoracic laminectomy for abscess (off anticoagulation due to epidural hemorrhage), L 2 compression fracture, anxiety disorder (meds as needed), bilateral foot drop managed by AFOs, HTN (monitor and provide prns in accordance with increased physical exertion and pain), pain (Biofeedback training with therapies to help reduce reliance on opiate pain medications, monitor pain control during therapies, and sedation at rest and titrate to maximum efficacy to ensure participation and gains in therapies),  hypoalbuminemia (maximize nutrition for overall health and wound healing), Thrombocytopenia (< 60,000/mm3 no resistive exercise), pulm HTN (Monitor in accordance with increased physical activity and avoid UE resistance excercises), severe osteoporosis with pathological fractures (consider scan, meds) 3. Due to bladder management, bowel management, safety, disease management, pain management and patient education does the patient require 24 hr/day rehab nursing? Yes 4. Does the patient require coordinated care of a physician, rehab nurse, PT (1-2 hrs/day, 5 days/week) and OT (1-2 hrs/day, 5 days/week) to address physical and functional deficits in the context of the above medical diagnosis(es)? Potentially Addressing deficits in the following areas: balance, endurance, locomotion, strength, transferring, bathing, dressing, toileting and psychosocial support 5. Can the patient actively participate in an intensive therapy program of at least 3 hrs of therapy 5 days a week? Yes 6. The potential for patient to make measurable gains while on inpatient rehab is excellent 7. Anticipated functional outcomes upon discharge from inpatients are: supervision PT, supervision and min assist OT, n/a SLP 8. Estimated rehab length of stay to reach the above  functional goals is: 13-18 days. 9. Does the patient have adequate social supports to accommodate these discharge functional goals? Potentially 10. Anticipated D/C setting: Possibly SNF 11. Anticipated post D/C treatments: HH therapy and Home excercise program 12. Overall Rehab/Functional Prognosis: good  RECOMMENDATIONS: This patient's condition is appropriate for continued rehabilitative care in the following setting: CIR Patient has agreed to participate in recommended program. Yes Note that insurance prior authorization may be required for reimbursement for recommended care.  [name] 01/06/2017  Delice Lesch, MD, [place]ody Drown

## 2017-01-06 NOTE — Progress Notes (Signed)
[name] Diones, RN Rehab Admission Coordinator Signed Physical Medicine and Rehabilitation  PMR Pre-admission Encounter Date: 01/06/2017 11:49 AM  Related encounter: Documentation from 01/06/2017 in [place]       '[]' Hide copied text   [place] PMR Admission Coordinator Pre-Admission Assessment  Patient: [name] Weber is an 83 y.o., female MRN: [id number] DOB: [birth year] Height: '5\' 6"'  (167.6 cm) Weight: 54.9 kg (121 lb 0.5 oz)  Insurance Information HMO: Yes   PPO:       PCP:       IPA:       80/20:       OTHER:  Group # [id number] PRIMARY: [place]      Policy#: [id number]      Subscriber:  [name] Weber CM Name: Sherlynn Stalls      Phone#: [phone]     Fax#:  [phone] Pre-Cert#:  [id number]      Employer:  Retired Benefits:  Phone #: [phone]     Name:  Hanley Seamen. Date: 11/30/16     Deduct:  $0      Out of Pocket Max: $4500 (met $40)      Life Max:  unlimited CIR: $345 days 1-5      SNF: $0 days 1-20; $160 days 21-49; $0 days 50-100 Outpatient: medical necessity     Co-Pay: $40/visit Home Health: 100%      Co-Pay: none DME: 80%     Co-Pay: 20% Providers: in network  Medicaid Application Date:        Case Manager:   Disability Application Date:        Case Worker:    Emergency Contact Information        Contact Information    Name Relation Home Work Vina   Jenera   [phone]   Angellina, Ferdinand   [phone]   Burkitt,Fay Other   [phone]   Johnnette Litter Sister [phone]     Oren Section Niece [phone]        Current Medical History  Patient Admitting Diagnosis: Pelvic fractures  History of Present Illness:An 83 y.o.femalewith history of A fib, T-9 paraplegia s/p thoracic laminectomy for abscess (off anticoagulation due to epidural hemorrhage), L 2 compression fracture--CIR stay 05/2016, anxiety disorder, bilateral foot drop  managed by AFOs, gait disorder with ongoing outpatient therapy; who was admitted to [facility] on 12/26/16 after fall due to syncope and inability to walk. History taken from chart review and patient. She was found to have pelvic ring fracture as well as reports of new low back pain. Dr. Marcelino Scot consulted and non-surgical management recommended. To be WBAT with B-AFOs and walker. Therapy evaluations completed and CIR consulted for follow up therapy.   Patient discharged to SNF after CIR stay in 07/17 due to lack of social supports. She has been going to outpatient PT twice a week since Oct. 2017.  Patient was medically ready for discharge on 12/30/16 and went to [facility] for continued therapies.  We had received a denial for acute inpatient rehab admission.  The denial was overturned on expedited appeal.  We are re-admitting to acute inpatient rehab today from [facility].   Patient's medical record from [facility] has been reviewed by the rehabilitation admission coordinator and physician.  Past Medical History      Past Medical History:  Diagnosis Date  . Fatigue   . Malaise   . Myalgia   . PAC (premature atrial contraction)  ISOLATED  . Palpitations    OCCASSIONAL  . Paroxysmal atrial flutter ([place])   . PVC's (premature ventricular contractions)    ISOLATED    Family History   family history includes Hyperlipidemia in her mother.  Prior Rehab/Hospitalizations Has the patient had major surgery during 100 days prior to admission? No.  Was recently on CIR in July 2017 and then at [facility] SNF after CIR.  Was discharged from [facility] on 12/30/16.  Was going to [place] outpatient therapy 2 X a week.              Current Medications Aspirin, calcium, digoxin, Weber oil, norco, xalatan, magnesium, miralax, tumeric  Patients Current Diet:   Low sodium diet  Precautions / Restrictions Precautions Precautions: Fall Other Brace/Splint: Bil  AFOs Restrictions Weight Bearing Restrictions: Yes RLE Weight Bearing: Weight bearing as tolerated LLE Weight Bearing: Weight bearing as tolerated   Has the patient had 2 or more falls or a fall with injury in the past year?Yes.  Had 1 fall in the bathroom which resulted in current pelvic fracture.  Prior Activity Level Community (5-7x/wk): Went to get hair done, visit friends, went to MD office,  Not driving since 01/03/81  Prior Functional Level Self Care: Did the patient need help bathing, dressing, using the toilet or eating?  Independent  Indoor Mobility: Did the patient need assistance with walking from room to room (with or without device)? Independent  Stairs: Did the patient need assistance with internal or external stairs (with or without device)? Needed some help  Functional Cognition: Did the patient need help planning regular tasks such as shopping or remembering to take medications? [place] Devices/Equipment: Environmental consultant (specify type), Other (Comment) Home Equipment: Walker - 2 wheels, Walker - 4 wheels, Shower seat, Toilet riser, Grab bars - tub/shower, Hand held shower head, Adaptive equipment  Prior Device Use: Indicate devices/aids used by the patient prior to current illness, exacerbation or injury? Mechanical lift, Walker and rollator.   Prior Functional Level Current Functional Level  Bed Mobility  Independent  Mod assist   Transfers  Independent  Mod assist   Mobility - Walk/Wheelchair  Independent  Min assist (Ambulated 10 feet RW.)   Upper Body Dressing  Independent  Min assist   Lower Body Dressing  Independent  Max assist   Grooming  Independent  Other (Set up)   Eating/Drinking  Independent  Other (Set up)   Toilet Transfer  Independent  Mod assist   Bladder Continence   Depends at night form incontinence  Wears depends at night for  incontinence   Bowel Management  Last BM 01/05/17  Last BM 01/05/17   Stair Climbing  Needs assistance Other (Not tried.)   Communication  Verbal and intact, but very HOH  Verbal and intact.  Very HOH and wears B hearing aides   Memory  Mild impairment  mild impairment   Cooking/Meal Prep  Some assistance from friends      Housework  Hired help on a weekly basis    Money Management  Independent but has 2 friends who are HCPOA and they assist when needed.    Driving  Not driving since 50/03.     Special needs/care consideration BiPAP/CPAP No CPM No Continuous Drip IV No Dialysis No         Life Vest No Oxygen No Special Bed No Trach Size No Wound Vac (area) No     Skin No  Bowel mgmt: Last BM 01/05/17 Bladder mgmt: Wears depends at night for incontinence Diabetic mgmt No  Previous Home Environment Living Arrangements: Alone  Lives With: Alone Available Help at Discharge: Family, Friend(s), Available PRN/intermittently Type of Home: Condominum Home Layout: One level Home Access: Other (comment) (Stair lift) From garage where there are 6 steps. Bathroom Shower/Tub: Multimedia programmer: Handicapped height Bathroom Accessibility: Yes How Accessible: Accessible via walker Home Care Services: No  Discharge Living Setting Plans for Discharge Living Setting: Alone, Condominium Type of Home at Discharge: Condominium Discharge Home Layout: One level Discharge Home Access: Other (comment) (Stair lift from garage entry.)  6 steps at garage entry. Does the patient have any problems obtaining your medications?: No  Social/Family/Support Systems Patient Roles: Parent, Other (Comment) Contact Information: Via Lawana Chambers - friend and HCPOA Anticipated Caregiver: Self and friends.  Has a son who lives in [place]. Anticipated Caregiver's Contact Information: Via - friend -  [phone] Ability/Limitations of Caregiver: Friends works.  Via and her sister live next door. Caregiver Availability: Intermittent Discharge Plan Discussed with Primary Caregiver: Yes Is Caregiver In Agreement with Plan?: Yes Does Caregiver/Family have Issues with Lodging/Transportation while Pt is in Rehab?: No  Goals/Additional Needs Patient/Family Goal for Rehab: PT/OT mod I goals Expected length of stay: 7-10 days Cultural Considerations: Attends church on Sundays Dietary Needs: Low Sodium diet Equipment Needs: Has hearing aides.  Very HOH.Marland Kitchen Special Service Needs: Patient was discharged to [facility] after we received a denial from [place] last week.  An expediated appeal was completed and the denial was overturned.  Today is the first day that we have had an available bed on inpatient rehab.  The patient prefers to admit to inpatient rehab rather than remain at [facility] at this time. Additional Information: Sees Dr. Naaman Plummer in the outpatient setting Pt/Family Agrees to Admission and willing to participate: Yes Program Orientation Provided & Reviewed with Pt/Caregiver Including Roles  & Responsibilities: Yes  Patient Condition: Patient was discharged to [facility] on 12/30/16.  Since then I have spoken to [place] several times with her requesting acute inpatient rehab admission.  Via and the patient feel patient will progress better and faster with intense therapies.  Patient has been to inpatient rehab previously and she did very well.  With 3 hours of therapy a day, the hope is that she can reach mod I goals and live alone once again upon discharge, but this may be difficult.  The patient has been participating with therapies in the SNF, but feels she needs the 3 hours of therapy a day in order to regain function.  I have approval and will re-admit to acute inpatient rehab today.  Preadmission Screen Completed By:  [name] Weber, 01/06/2017 11:49  AM ______________________________________________________________________   Discussed status with Dr. Posey Pronto on  01/06/17 at 1219 and received telephone approval for admission today.  Admission Coordinator:  [name] Weber, time 1229/Date 01/06/17   Assessment/Plan: Diagnosis: Pelvic fractures 1. Does the need for close, 24 hr/day  Medical supervision in concert with the patient's rehab needs make it unreasonable for this patient to be served in a less intensive setting? Potentially 2. Co-Morbidities requiring supervision/potential complications: A fib (monitor HR with increased physical activity), T-9 paraplegia s/p thoracic laminectomy for abscess (off anticoagulation due to epidural hemorrhage), L 2 compression fracture, anxiety disorder (meds as needed), bilateral foot drop managed by AFOs, HTN (monitor and provide prns in accordance with increased physical exertion and pain), pain (Biofeedback training with  therapies to help reduce reliance on opiate pain medications, monitor pain control during therapies, and sedation at rest and titrate to maximum efficacy to ensure participation and gains in therapies), hypoalbuminemia (maximize nutrition for overall health and wound healing), Thrombocytopenia (< 60,000/mm3 no resistive exercise), pulm HTN (Monitor in accordance with increased physical activity and avoid UE resistance excercises), severe osteoporosis with pathological fractures (consider scan, meds) 3. Due to bladder management, bowel management, safety, disease management, pain management and patient education does the patient require 24 hr/day rehab nursing? Yes 4. Does the patient require coordinated care of a physician, rehab nurse, PT (1-2 hrs/day, 5 days/week) and OT (1-2 hrs/day, 5 days/week) to address physical and functional deficits in the context of the above medical diagnosis(es)? Potentially Addressing deficits in the following areas: balance, endurance, locomotion, strength,  transferring, bathing, dressing, toileting and psychosocial support 5. Can the patient actively participate in an intensive therapy program of at least 3 hrs of therapy 5 days a week? Yes 6. The potential for patient to make measurable gains while on inpatient rehab is excellent 7. Anticipated functional outcomes upon discharge from inpatients are: supervision PT, supervision and min assist OT, n/a SLP 8. Estimated rehab length of stay to reach the above functional goals is: 13-18 days. 9. Does the patient have adequate social supports to accommodate these discharge functional goals? Potentially 10. Anticipated D/C setting: Possibly SNF 11. Anticipated post D/C treatments: HH therapy and Home excercise program 12. Overall Rehab/Functional Prognosis: good  RECOMMENDATIONS: This patient's condition is appropriate for continued rehabilitative care in the following setting: CIR Patient has agreed to participate in recommended program. Yes Note that insurance prior authorization may be required for reimbursement for recommended care.  [name] Weber 01/06/2017  Delice Lesch, MD, [facility]

## 2017-01-07 ENCOUNTER — Inpatient Hospital Stay (HOSPITAL_COMMUNITY): Payer: Medicare Other | Admitting: Physical Therapy

## 2017-01-07 ENCOUNTER — Inpatient Hospital Stay (HOSPITAL_COMMUNITY): Payer: Medicare Other

## 2017-01-07 ENCOUNTER — Inpatient Hospital Stay (HOSPITAL_COMMUNITY): Payer: Medicare Other | Admitting: Occupational Therapy

## 2017-01-07 DIAGNOSIS — G959 Disease of spinal cord, unspecified: Secondary | ICD-10-CM

## 2017-01-07 DIAGNOSIS — S3282XS Multiple fractures of pelvis without disruption of pelvic ring, sequela: Secondary | ICD-10-CM

## 2017-01-07 DIAGNOSIS — I8290 Acute embolism and thrombosis of unspecified vein: Secondary | ICD-10-CM

## 2017-01-07 LAB — COMPREHENSIVE METABOLIC PANEL
ALT: 11 U/L — ABNORMAL LOW (ref 14–54)
AST: 17 U/L (ref 15–41)
Albumin: 2.8 g/dL — ABNORMAL LOW (ref 3.5–5.0)
Alkaline Phosphatase: 89 U/L (ref 38–126)
Anion gap: 10 (ref 5–15)
BUN: 22 mg/dL — ABNORMAL HIGH (ref 6–20)
CO2: 28 mmol/L (ref 22–32)
Calcium: 8.9 mg/dL (ref 8.9–10.3)
Chloride: 102 mmol/L (ref 101–111)
Creatinine, Ser: 0.6 mg/dL (ref 0.44–1.00)
GFR calc Af Amer: >60 mL/min (ref >60)
GFR calc non Af Amer: >60 mL/min (ref >60)
Glucose, Bld: 98 mg/dL (ref 65–99)
Potassium: 4.2 mmol/L (ref 3.5–5.1)
Sodium: 140 mmol/L (ref 135–145)
Total Bilirubin: 0.5 mg/dL (ref 0.3–1.2)
Total Protein: 5.9 g/dL — ABNORMAL LOW (ref 6.5–8.1)

## 2017-01-07 LAB — CBC WITH DIFFERENTIAL/PLATELET
Basophils Absolute: 0 10*3/uL (ref 0.0–0.1)
Basophils Relative: 1 %
Eosinophils Absolute: 0.1 10*3/uL (ref 0.0–0.7)
Eosinophils Relative: 2 %
HCT: 37.4 % (ref 36.0–46.0)
Hemoglobin: 12 g/dL (ref 12.0–15.0)
Lymphocytes Relative: 23 %
Lymphs Abs: 1.6 10*3/uL (ref 0.7–4.0)
MCH: 31.7 pg (ref 26.0–34.0)
MCHC: 32.1 g/dL (ref 30.0–36.0)
MCV: 98.9 fL (ref 78.0–100.0)
Monocytes Absolute: 0.7 10*3/uL (ref 0.1–1.0)
Monocytes Relative: 10 %
Neutro Abs: 4.5 10*3/uL (ref 1.7–7.7)
Neutrophils Relative %: 64 %
Platelets: 295 10*3/uL (ref 150–400)
RBC: 3.78 MIL/uL — ABNORMAL LOW (ref 3.87–5.11)
RDW: 12.5 % (ref 11.5–15.5)
WBC: 6.9 10*3/uL (ref 4.0–10.5)

## 2017-01-07 MED ORDER — ENOXAPARIN SODIUM 30 MG/0.3ML ~~LOC~~ SOLN
30.0000 mg | SUBCUTANEOUS | Status: DC
  Administered 2017-01-07 – 2017-01-08 (×2): 30 mg via SUBCUTANEOUS
  Filled 2017-01-07 (×2): qty 0.3

## 2017-01-07 NOTE — Progress Notes (Signed)
Social Work Assessment and Plan  Patient Details  Name: [name] Weber MRN: [id number] Date of Birth: [birth year]  Today's Date: 01/07/2017  Problem List:  Patient Active Problem List   Diagnosis Date Noted  . Closed nondisplaced fracture of pelvis with routine healing   . Pelvic ring fracture ([place])   . Osteoporosis with current pathological fracture   . Slow transit constipation   . Fractures   . Pain   . Generalized anxiety disorder   . Benign essential HTN   . Hypoalbuminemia due to protein-calorie malnutrition ([place])   . Thrombocytopenia ([place])   . Pulmonary hypertension   . Age-related osteoporosis with current pathological fracture   . Pelvic fracture ([place]) 12/26/2016  . Multiple closed fractures of pelvis with stable disruption of pelvic circle (HCC)   . Memory loss   . Lumbar burst fracture ([place]) 06/08/2016  . Lumbar compression fracture ([place]) 06/08/2016  . Bilateral foot-drop   . Midline thoracic back pain   . A-fib ([place])   . Depression   . Constipation due to pain medication   . Chronic constipation   . Compression fracture of L2 ([place]) 05/30/2016  . Elevated blood pressure 05/30/2016  . L2 vertebral fracture ([place]) 05/30/2016  . Anxiety state 12/10/2014  . Neurogenic bladder 11/30/2014  . Neurogenic bowel 11/30/2014  . Bacterial UTI 11/30/2014  . Thoracic myelopathy 11/29/2014  . Persistent atrial fibrillation ([place]) 11/27/2014  . Traumatic spinal subdural hematoma   . Acute pulmonary embolism ([place])   . Orthostatic hypotension 11/22/2014  . Near syncope 11/21/2014  . Paraplegia at T9 level ([place]) 11/17/2014  . Weakness of both legs   . Nocturnal leg cramps 07/16/2014  . Encounter for therapeutic drug monitoring 12/25/2013  . Malaise and fatigue 06/01/2011  . Atrial flutter ([place]) 03/10/2011  . Long term (current) use of anticoagulants 03/10/2011  . Benign hypertensive heart disease without heart failure 03/10/2011  . HLD (hyperlipidemia) 03/10/2011  .  Paroxysmal atrial flutter ([place])   . Palpitations   . PAC (premature atrial contraction)   . PVC's (premature ventricular contractions)   . Malaise   . Fatigue   . Myalgia    Past Medical History:  Past Medical History:  Diagnosis Date  . Fatigue   . Malaise   . Myalgia   . PAC (premature atrial contraction)    ISOLATED  . Palpitations    OCCASSIONAL  . Paroxysmal atrial flutter ([place])   . PVC's (premature ventricular contractions)    ISOLATED   Past Surgical History:  Past Surgical History:  Procedure Laterality Date  . THORACIC LAMINECTOMY FOR EPIDURAL ABSCESS N/A 11/16/2014   Procedure: THORACIC LAMINECTOMY FOR EPIDURAL ABSCESS;  Surgeon: Floyce Stakes, MD;  Location: [facility];  Service: Neurosurgery;  Laterality: N/A;  . VULVAR LESION REMOVAL  01/01/2009   She had a 1-cm area of erythema to the right of the urethral meatus   Social History:  reports that she quit smoking about 25 years ago. She has quit using smokeless tobacco. She reports that she does not drink alcohol or use drugs.  Family / Support Systems Marital Status: Divorced How Long?: "a long time ago" Patient Roles: Parent, Psychologist, occupational, Other (Comment) (friend/neighbor; church member) Children: Erminia Mcnew - son - [phone]; Mylena Sedberry - dtr-in-law - [phone] They live in [place]. Other Supports: Via [place] - friend/POA - [phone]; Faye Mohorn - friend/POA - [phone];  Johnnette Litter - sister - [phone]; Oren Section - niece - [phone] Anticipated Caregiver: Pt is trying to figure this out right now.  Friends vs. paid caregivers vs. Assisted Living Facility Ability/Limitations of Caregiver: Friends work Careers adviser: Intermittent Family Dynamics: close, supportive friends  Social History Preferred language: English Religion: Catholic Education: some classes at [employer]: Yes Write: Yes Employment Status: Retired Date  Retired/Disabled/Unemployed: 1995 Age Retired: 67 Public relations account executive Issues: none reported Guardian/Conservator: N/A - Per Dr. Naaman Plummer, pt is capable of making her own decisions.   Abuse/Neglect Physical Abuse: Denies Verbal Abuse: Denies Sexual Abuse: Denies Exploitation of patient/patient's resources: Denies Self-Neglect: Denies  Emotional Status Pt's affect, behavior and adjustment status: Pt reports coping with another medical setback, but admits to wondering why this is all happening to her.  In the next moment, she is grateful that nothing worse has happened to her and is motivated to get better. Recent Psychosocial Issues: Pt with multiple medical issues in recent years.  She is disappointment by this, but is hopeful to get better.  Pt ended up d/c'ing from [facility] on 12-30-16 due to insurance initially denying CIR admission.  POA appealed, decision overturned, and pt was admitted on 01-06-17. Psychiatric History: none reported Substance Abuse History: none reported  Patient / Family Perceptions, Expectations & Goals Pt/Family understanding of illness & functional limitations: Pt expressed an understanding of her condition. Premorbid pt/family roles/activities: Pt had returned from [facility] 08/2016 to home (after 3 month stay) and was going for outpt therapy.  She was pleased to be home until she fell and fractured her pelvis.  She was managing with help with meals, housekeeping. Anticipated changes in roles/activities/participation: Pt hopes to return to activities as she is able. Pt/family expectations/goals: Pt wants to get back to enjoying life.  Community Resources [employer]: Other (Comment) ([place]) Premorbid Home Care/DME Agencies: Other (Comment) (pt has a rollator, rolling walker, and shower seat) Transportation available at discharge: friends Resource referrals recommended: Neuropsychology  Discharge Planning Living  Arrangements: Alone Support Systems: Children, Other relatives, Friends/neighbors, Social worker community Type of Residence: Private residence Insurance Resources: Information systems manager (Theme park manager) Museum/gallery curator Resources: [place] Referred: No Money Management: Patient Does the patient have any problems obtaining your medications?: No Home Management: Pt has someone to help her clean her home. Patient/Family Preliminary Plans: Pt is trying to decide what to do at d/c - hire paid caregivers vs. ALF.  CSW will assist. Barriers to Discharge: Steps Social Work Anticipated Follow Up Needs: HH/OP Expected length of stay: 5 to 7 days  Clinical Impression CSW met with pt who is known to this CSW from previous two CIR admissions to reintroduce self and role, as well as to update assessment.  Pt was grateful to have been moved from SNF to CIR and was hopeful for a good amount of time on CIR.  CSW explained that pt was doing quite well and therapists expected 5 to 7 day length of stay.  Pt seemed surprised by this.  We talked about recommendations for supervision and options for d/c support being paid caregivers vs. [place].  Pt will not need SNF level of care at d/c per therapists and CSW shared this with pt.  Insurance would likely not cover SNF again, especially if pt reaches supervision goal level.  Pt expressed understanding.  Pt's POA and friend, Via Lawana Chambers, was not present at visit, but pt gave CSW permission to call to update her.  CSW will reach out to her to present options and  offer assistance with d/c planning.  Pt prefers outpt rehab to [facility].  CSW will continue to follow and assist as needed.  Yanai Hobson, Silvestre Mesi 01/07/2017, 4:24 PM

## 2017-01-07 NOTE — Progress Notes (Signed)
Occupational Therapy Session Note  Patient Details  Name: [name] Weber MRN: [id number] Date of Birth: 03/05/1931  Today's Date: 01/07/2017 OT Individual Time: 1500-1530 OT Individual Time Calculation (min): 30 min   Skilled Therapeutic Interventions/Progress Updates:    Pt seated in wc and agreeable to OT treatment session focused on cognition. OT administered Montreal Cognitive Assessment (MOCA). Pt score 15/30 with deficits in the areas of executive function, memory, language, abstraction, delayed recall, and orientation. Pt had difficulty remembering how to set the hands on a clock and difficulty recalling simple directions. Pt also disoriented to time, thinking it was March and [name] was the president.  A score of 11-17/30 on the MOCA indicates a definite cognitive decline and impairment most often associated with mild dementia. Individuals with mild dementia often have mild impairments with household tasks / personal care and most often require supervision with daily tasks for safety.   Therapy Documentation Precautions:  Restrictions RLE Weight Bearing: Weight bearing as tolerated LLE Weight Bearing: Weight bearing as tolerated Pain:  Denies pain  See Function Navigator for Current Functional Status.   Therapy/Group: Individual Therapy  Mal Amabilelisabeth S Deshondra Worst 01/07/2017, 3:41 PM

## 2017-01-07 NOTE — Evaluation (Signed)
Occupational Therapy Assessment and Plan  Patient Details  Name: [name] Weber MRN: [id number] Date of Birth: 12-26-1930  OT Diagnosis: acute pain, muscle weakness (generalized) and pain in joint Rehab Potential: Rehab Potential (ACUTE ONLY): Fair ELOS: 5-7 days   Today's Date: 01/07/2017 OT Individual Time: 0930-1030 OT Individual Time Calculation (min): 60 min     Problem List:  Patient Active Problem List   Diagnosis Date Noted  . Closed nondisplaced fracture of pelvis with routine healing   . Pelvic ring fracture ([place])   . Osteoporosis with current pathological fracture   . Slow transit constipation   . Fractures   . Pain   . Generalized anxiety disorder   . Benign essential HTN   . Hypoalbuminemia due to protein-calorie malnutrition ([place])   . Thrombocytopenia ([place])   . Pulmonary hypertension   . Age-related osteoporosis with current pathological fracture   . Pelvic fracture ([place]) 12/26/2016  . Multiple closed fractures of pelvis with stable disruption of pelvic circle (HCC)   . Memory loss   . Lumbar burst fracture ([place]) 06/08/2016  . Lumbar compression fracture ([place]) 06/08/2016  . Bilateral foot-drop   . Midline thoracic back pain   . A-fib ([place])   . Depression   . Constipation due to pain medication   . Chronic constipation   . Compression fracture of L2 ([place]) 05/30/2016  . Elevated blood pressure 05/30/2016  . L2 vertebral fracture ([place]) 05/30/2016  . Anxiety state 12/10/2014  . Neurogenic bladder 11/30/2014  . Neurogenic bowel 11/30/2014  . Bacterial UTI 11/30/2014  . Thoracic myelopathy 11/29/2014  . Persistent atrial fibrillation ([place]) 11/27/2014  . Traumatic spinal subdural hematoma   . Acute pulmonary embolism ([place])   . Orthostatic hypotension 11/22/2014  . Near syncope 11/21/2014  . Paraplegia at T9 level ([place]) 11/17/2014  . Weakness of both legs   . Nocturnal leg cramps 07/16/2014  . Encounter for therapeutic drug monitoring 12/25/2013  .  Malaise and fatigue 06/01/2011  . Atrial flutter ([place]) 03/10/2011  . Long term (current) use of anticoagulants 03/10/2011  . Benign hypertensive heart disease without heart failure 03/10/2011  . HLD (hyperlipidemia) 03/10/2011  . Paroxysmal atrial flutter ([place])   . Palpitations   . PAC (premature atrial contraction)   . PVC's (premature ventricular contractions)   . Malaise   . Fatigue   . Myalgia     Past Medical History:  Past Medical History:  Diagnosis Date  . Fatigue   . Malaise   . Myalgia   . PAC (premature atrial contraction)    ISOLATED  . Palpitations    OCCASSIONAL  . Paroxysmal atrial flutter ([place])   . PVC's (premature ventricular contractions)    ISOLATED   Past Surgical History:  Past Surgical History:  Procedure Laterality Date  . THORACIC LAMINECTOMY FOR EPIDURAL ABSCESS N/A 11/16/2014   Procedure: THORACIC LAMINECTOMY FOR EPIDURAL ABSCESS;  Surgeon: Floyce Stakes, MD;  Location: [facility];  Service: Neurosurgery;  Laterality: N/A;  . VULVAR LESION REMOVAL  01/01/2009   She had a 1-cm area of erythema to the right of the urethral meatus    Assessment & Plan Clinical Impression: 83 year old right-handed female with history of atrial fibrillation, T9 paraplegia/myelopathy status post thoracic laminectomy for abscess off anticoagulation due to epidural hemorrhage and received CIR 11/29/2014-12/17/2014 and discharged to [facility], L2 compression fracture received CIR and again received inpatient rehabilitation services 06/08/2016-06/19/2016 and discharge to [facility] skilled nursing facility and had since  returned home where she was using a walker for mobility. She was able to complete her ADLs. History taken from chart review and patient. Patient lives alone. She has a son in [place]. Neighbors with good support that bring her to the grocery store as well as doctor appointments. Patient with chronic bilateral foot drop managed  by AFOs, gait disorder and had been receiving ongoing outpatient therapies. Recent admission to [facility] 12/26/2016 after a fall due to question syncope. Found to have pelvic ring fracture and was evaluated by orthopedic services Dr. Marcelino Scot who advised nonsurgical management weightbearing as tolerated. Hip xray 1/31 reviewed, showing osteopenia and stable fracture. She received evaluations from both physical and occupational therapy. Moderate assist for stand pivot transfers as well as moderate assist sit to stand. She was denied by insurance for inpatient rehabilitation services and discharged to [facility] skilled nursing facility 12/30/2016. Expedited appeal by her insurance excepting patient to CIR. She was admitted for a comprehensive rehabilitation program.  Patient transferred to CIR on 01/06/2017 .    Patient currently requires min with basic self-care skills secondary to muscle weakness, decreased cardiorespiratoy endurance, decreased problem solving, decreased safety awareness and decreased memory and decreased standing balance, decreased postural control and decreased balance strategies.  Prior to hospitalization, patient could complete ADLs with modified independent .  Patient will benefit from skilled intervention to decrease level of assist with basic self-care skills, increase independence with basic self-care skills and increase level of independence with iADL prior to discharge home independently.  Anticipate patient will require intermittent supervision and follow up home health.  OT - End of Session Activity Tolerance: Tolerates 10 - 20 min activity with multiple rests Endurance Deficit: Yes Endurance Deficit Description: Requires rest breaks throughout bathing/dressing OT Assessment Rehab Potential (ACUTE ONLY): Fair Barriers to Discharge: Decreased caregiver support Barriers to Discharge Comments: Only has friends stopping by intermittently throughout the week.  Recommending consistent intermittent supervision OT Patient demonstrates impairments in the following area(s): Balance;Cognition;Endurance;Safety;Pain OT Basic ADL's Functional Problem(s): Bathing;Dressing;Grooming;Toileting OT Advanced ADL's Functional Problem(s): Simple Meal Preparation OT Transfers Functional Problem(s): Toilet;Tub/Shower OT Additional Impairment(s): None OT Plan OT Intensity: Minimum of 1-2 x/day, 45 to 90 minutes OT Frequency: 5 out of 7 days OT Duration/Estimated Length of Stay: 5-7 days OT Treatment/Interventions: Balance/vestibular training;Discharge planning;Community reintegration;DME/adaptive equipment instruction;Functional mobility training;Pain management;Patient/family education;Self Care/advanced ADL retraining;Therapeutic Activities;Therapeutic Exercise;UE/LE Strength taining/ROM;UE/LE Coordination activities OT Self Feeding Anticipated Outcome(s): Mod I OT Basic Self-Care Anticipated Outcome(s): Mod i OT Toileting Anticipated Outcome(s): Mod I OT Bathroom Transfers Anticipated Outcome(s): Supervision shower; mod I toileting OT Recommendation Patient destination: Home Follow Up Recommendations: Home health OT Equipment Recommended: To be determined   Skilled Therapeutic Intervention  Pt seen for OT eval and ADL bathing/dressing session. Pt sitting up in w/c upon arrival, voicing increased pain in back, RN made aware and medication administered.  She ambulated throughout session with RW and CGA. She bathed seated on shower seat with steadying assist when standing to complete buttock hygiene. See functional navigator for function level. Completed simulated shower stall transfer in simulation of home environment, mod A required to stand from shower chair. Pt returned to room at end of session, left sitting in w/c with all needs in reach.   OT Evaluation Precautions/Restrictions  Precautions Precautions: Fall Required Braces or Orthoses: Other  Brace/Splint Other Brace/Splint: Bil AFOs Restrictions Weight Bearing Restrictions: Yes RLE Weight Bearing: Weight bearing as tolerated LLE Weight Bearing: Weight bearing as tolerated General Chart Reviewed: Yes Additional Pertinent  History: Hx of paraperesis Pain   No/ denies pain Home Living/Prior Functioning Home Living Available Help at Discharge: Family, Friend(s), Available PRN/intermittently Type of Home: Apartment Home Access: Other (comment) (Stair lift) Home Layout: One level Bathroom Shower/Tub: Multimedia programmer: Handicapped height Bathroom Accessibility: Yes  Lives With: Alone Vision/Perception  Vision- History Baseline Vision/History: Wears glasses Wears Glasses: Reading only Patient Visual Report: No change from baseline Vision- Assessment Vision Assessment?: No apparent visual deficits  Cognition Overall Cognitive Status: History of cognitive impairments - at baseline Arousal/Alertness: Awake/alert Orientation Level: Person;Place;Situation Person: Oriented Place: Oriented Situation: Oriented Year: 2018 Month: January Day of Week: Incorrect Memory: Impaired Memory Impairment: Decreased recall of new information;Decreased short term memory;Decreased long term memory Decreased Long Term Memory: Verbal basic;Functional basic Decreased Short Term Memory: Verbal basic;Functional basic Immediate Memory Recall: Sock;Blue;Bed Memory Recall: Sock;Bed;Blue Memory Recall Sock: Without Cue Memory Recall Blue: With Cue Memory Recall Bed: Without Cue Awareness: Impaired Awareness Impairment: Anticipatory impairment Problem Solving: Impaired Problem Solving Impairment: Verbal complex;Functional complex Executive Function: Decision Making Decision Making: Impaired Decision Making Impairment: Functional complex;Verbal complex Safety/Judgment: Impaired Comments: Decreased safety awareness/ problem solving and memory at  [place] Sensation Sensation Light Touch: Appears Intact Coordination Gross Motor Movements are Fluid and Coordinated: No Fine Motor Movements are Fluid and Coordinated: Yes Coordination and Movement Description: Hx of incomplete paraperesis Motor  Motor Motor: Other (comment) Motor - Skilled Clinical Observations: Hx of incomplete parapesis Trunk/Postural Assessment  Cervical Assessment Cervical Assessment: Within Functional Limits Thoracic Assessment Thoracic Assessment: Exceptions to [facility] Lumbar Assessment Lumbar Assessment: Exceptions to [facility] (Posterior pelvic tilt) Postural Control Postural Control: Within Functional Limits  Balance Balance Balance Assessed: Yes Static Standing Balance Static Standing - Balance Support: During functional activity Static Standing - Level of Assistance: 5: Stand by assistance Dynamic Standing Balance Dynamic Standing - Balance Support: During functional activity;Right upper extremity supported;Left upper extremity supported Dynamic Standing - Level of Assistance: 4: Min assist Dynamic Standing - Comments: With support of RW Extremity/Trunk Assessment RUE Assessment RUE Assessment: Within Functional Limits LUE Assessment LUE Assessment: Within Functional Limits   See Function Navigator for Current Functional Status.   Refer to Care Plan for Long Term Goals  Recommendations for other services: None    Discharge Criteria: Patient will be discharged from OT if patient refuses treatment 3 consecutive times without medical reason, if treatment goals not met, if there is a change in medical status, if patient makes no progress towards goals or if patient is discharged from hospital.  The above assessment, treatment plan, treatment alternatives and goals were discussed and mutually agreed upon: by patient  Ernestina Patches 01/07/2017, 4:15 PM

## 2017-01-07 NOTE — Progress Notes (Signed)
Vascular study lower extremity showed isolated DVT in the right soleal vein. No DVT left lower extremity. Plan to begin Lovenox 30 mg daily and repeat vascular study prior to discharge.

## 2017-01-07 NOTE — Progress Notes (Signed)
*  PRELIMINARY RESULTS* Vascular Ultrasound Lower extremity venous duplex has been completed.  Preliminary findings: isolated DVT noted in the right soleal vein. No DVT LLE.  Gave results to Eaton CorporationPamela Love.  Farrel DemarkJill Eunice, RDMS, RVT  01/07/2017, 11:22 AM

## 2017-01-07 NOTE — Progress Notes (Signed)
[facility]ICINE [facility] RE[facility]ILITATION     PROGRESS NOTE    Subjective/Complaints: Had a reasonable night. Moving bowels. Still having pain in right pelvis--using tylenol for relief  ROS: pt denies nausea, vomiting, diarrhea, cough, shortness of breath or chest pain   Objective: Vital Signs: Blood pressure 118/85, pulse 78, temperature 98.6 F (37 C), temperature source Oral, resp. rate 16, height 5\' 8"  (1.727 m), weight 47.5 kg (104 lb 11.2 oz), SpO2 99 %. No results found.  Recent Labs  01/07/17 0515  WBC 6.9  HGB 12.0  HCT 37.4  PLT 295    Recent Labs  01/07/17 0515  NA 140  K 4.2  CL 102  GLUCOSE 98  BUN 22*  CREATININE 0.60  CALCIUM 8.9   CBG (last 3)  No results for input(s): GLUCAP in the last 72 hours.  Wt Readings from Last 3 Encounters:  01/06/17 47.5 kg (104 lb 11.2 oz)  01/06/17 54.9 kg (121 lb 0.5 oz)  12/28/16 54.9 kg (121 lb 0.5 oz)    Physical Exam:   Constitutional: She is oriented to person, place, and time. She appears well-developed.  Frail appearing. No distress HENT:  Head: Normocephalic and atraumatic.  Eyes: PERRL  Neck: Normal range of motion. Neck supple. No thyromegaly present.  Cardiovascular:  IRR/IRR Respiratory: cta bilaterally. No wheezes GI: Soft. Bowel sounds are normal. She exhibits no distension. There is no tenderness.  Musculoskeletal: She exhibits tenderness. She exhibits no edema.  Bilateral foot drop with contractures present..  Pain with range of motion right greater than left hip.  unable to lift right hip against gravity while seated Neurological: She is alert and oriented to person, place, and time.  Follows commands Sensation intact to light touch except for distal LE's Speech clear.  Motor: B/l UE 5/5 proximal to distal RLE: HF 2-/5, KE 4/5, ADF/PF 2/5 with contracture LLE: HF 3+/5, KE 4+/5, ADF/PF 2/5 with contracture   Skin: Skin is warm and dry.  Psychiatric: She has a normal mood and  affect. Her behavior is normal. Thought content normal.    Assessment/Plan: 1. Functional and mobility deficits secondary to pelvic fractures after fall which require 3+ hours per day of interdisciplinary therapy in a comprehensive inpatient rehab setting. Physiatrist is providing close team supervision and 24 hour management of active medical problems listed below. Physiatrist and rehab team continue to assess barriers to discharge/monitor patient progress toward functional and medical goals.  Function:  Bathing Bathing position      Bathing parts      Bathing assist        Upper Body Dressing/Undressing Upper body dressing                    Upper body assist        Lower Body Dressing/Undressing Lower body dressing                                  Lower body assist        Toileting Toileting          Toileting assist     Transfers Chair/bed transfer             Locomotion Ambulation           Wheelchair          Cognition Comprehension    Expression    Social Interaction  Problem Solving    Memory     Medical Problem List and Plan: 1.  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 Weightbearing as tolerated. AFO braces.             -begin CIR therapies  -will need to be discussing with patient long term living situation. Pt is aware of the need. We reviewed today 2.  DVT Prophylaxis/Anticoagulation: SCDs. Check vascular study 3. Pain Management: Hydrocodone as needed. 4. Mood: Provide emotional support 5. Neuropsych: This patient is capable of making decisions on her own behalf. 6. Skin/Wound Care: Routine skin checks 7. Fluids/Electrolytes/Nutrition: encourage PO, especially fluids.    -I personally reviewed the patient's labs today.  8. Atrial fibrillation. Lanoxin 0.125 mg daily. Cardiac rate control. Continue  aspirin therapy 9. Constipation. Laxative assistance 10. Hypoalbuminemia: Albumin ordered, supps as tolerated  11. Thrombocytopenia: Follow CBC 12. Severe osteoporosis: Monitor for further pathological fractures    LOS (Days) 1 A FACE TO FACE EVALUATION WAS PERFORMED  Faith RogueSWARTZ,ZACHARY T, MD 01/07/2017 8:50 AM

## 2017-01-07 NOTE — Progress Notes (Signed)
Inpatient Rehabilitation Center Individual Statement of Services  Patient Name:  [name] Weber  Date:  01/07/2017  Welcome to the Inpatient Rehabilitation Center.  Our goal is to provide you with an individualized program based on your diagnosis and situation, designed to meet your specific needs.  With this comprehensive rehabilitation program, you will be expected to participate in at least 3 hours of rehabilitation therapies Monday-Friday, with modified therapy programming on the weekends.  Your rehabilitation program will include the following services:  Physical Therapy (PT), Occupational Therapy (OT), 24 hour per day rehabilitation nursing, Neuropsychology, Case Management (Social Worker), Rehabilitation Medicine, Nutrition Services and Pharmacy Services  Weekly team conferences will be held on Tuesdays to discuss your progress.  Your Social Worker will talk with you frequently to get your input and to update you on team discussions.  Team conferences with you and your family in attendance may also be held.  Expected length of stay:  5 to 7 days  Overall anticipated outcome:  Supervision  Depending on your progress and recovery, your program may change. Your Social Worker will coordinate services and will keep you informed of any changes. Your Social Worker's name and contact numbers are listed  below.  The following services may also be recommended but are not provided by the Inpatient Rehabilitation Center:   Driving Evaluations  Home Health Rehabiltiation Services  Outpatient Rehabilitation Services   Arrangements will be made to provide these services after discharge if needed.  Arrangements include referral to agencies that provide these services.  Your insurance has been verified to be:  [employer] Your primary doctor is:  Dr. Marden Nobleobert [name]  Pertinent information will be shared with your doctor and your insurance company.  Social Worker:  Staci AcostaJenny Lakeisha Waldrop,  LCSW  [phone] or [phone]) [phone]  Information discussed with and copy given to patient by: Elvera LennoxPrevatt, Syrai Gladwin Capps, 01/07/2017, 2:12 PM

## 2017-01-07 NOTE — Evaluation (Signed)
Physical Therapy Assessment and Plan  Patient Details  Name: [name] Weber MRN: [id number] Date of Birth: [birth year]  PT Diagnosis: Abnormal posture, Abnormality of gait, Ataxic gait, Contracture of joint: B ankles, Coordination disorder, Difficulty walking, Impaired cognition, Muscle weakness and Pain in low back, pelvis Rehab Potential: Good ELOS: 5-7 days   Today's Date: 01/07/2017 PT Individual Time: 0800-0900 PT Individual Time Calculation (min): 60 min    Problem List:  Patient Active Problem List   Diagnosis Date Noted  . Closed nondisplaced fracture of pelvis with routine healing   . Pelvic ring fracture ([place])   . Osteoporosis with current pathological fracture   . Slow transit constipation   . Fractures   . Pain   . Generalized anxiety disorder   . Benign essential HTN   . Hypoalbuminemia due to protein-calorie malnutrition ([place])   . Thrombocytopenia ([place])   . Pulmonary hypertension   . Age-related osteoporosis with current pathological fracture   . Pelvic fracture ([place]) 12/26/2016  . Multiple closed fractures of pelvis with stable disruption of pelvic circle (HCC)   . Memory loss   . Lumbar burst fracture ([place]) 06/08/2016  . Lumbar compression fracture ([place]) 06/08/2016  . Bilateral foot-drop   . Midline thoracic back pain   . A-fib ([place])   . Depression   . Constipation due to pain medication   . Chronic constipation   . Compression fracture of L2 ([place]) 05/30/2016  . Elevated blood pressure 05/30/2016  . L2 vertebral fracture ([place]) 05/30/2016  . Anxiety state 12/10/2014  . Neurogenic bladder 11/30/2014  . Neurogenic bowel 11/30/2014  . Bacterial UTI 11/30/2014  . Thoracic myelopathy 11/29/2014  . Persistent atrial fibrillation ([place]) 11/27/2014  . Traumatic spinal subdural hematoma   . Acute pulmonary embolism ([place])   . Orthostatic hypotension 11/22/2014  . Near syncope 11/21/2014  . Paraplegia at T9 level ([place]) 11/17/2014  . Weakness of both legs   .  Nocturnal leg cramps 07/16/2014  . Encounter for therapeutic drug monitoring 12/25/2013  . Malaise and fatigue 06/01/2011  . Atrial flutter ([place]) 03/10/2011  . Long term (current) use of anticoagulants 03/10/2011  . Benign hypertensive heart disease without heart failure 03/10/2011  . HLD (hyperlipidemia) 03/10/2011  . Paroxysmal atrial flutter ([place])   . Palpitations   . PAC (premature atrial contraction)   . PVC's (premature ventricular contractions)   . Malaise   . Fatigue   . Myalgia     Past Medical History:  Past Medical History:  Diagnosis Date  . Fatigue   . Malaise   . Myalgia   . PAC (premature atrial contraction)    ISOLATED  . Palpitations    OCCASSIONAL  . Paroxysmal atrial flutter ([place])   . PVC's (premature ventricular contractions)    ISOLATED   Past Surgical History:  Past Surgical History:  Procedure Laterality Date  . THORACIC LAMINECTOMY FOR EPIDURAL ABSCESS N/A 11/16/2014   Procedure: THORACIC LAMINECTOMY FOR EPIDURAL ABSCESS;  Surgeon: Floyce Stakes, MD;  Location: [facility];  Service: Neurosurgery;  Laterality: N/A;  . VULVAR LESION REMOVAL  01/01/2009   She had a 1-cm area of erythema to the right of the urethral meatus    Assessment & Plan Clinical Impression: An24 y.o.femalewith history of A fib, T-9 paraplegia s/p thoracic laminectomy for abscess (off anticoagulation due to epidural hemorrhage), L 2 compression fracture--CIR stay 05/2016, anxiety disorder, bilateral foot drop managed by AFOs, gait disorder with ongoing outpatient therapy; [facility] fall due to syncope and inability to walk. History taken from chart review and patient. She was found to have pelvic ring fracture as well as reports of new low back pain. Dr. Marcelino Scot consulted and non-surgical management recommended. To be WBAT with B-AFOs and walker. Therapy evaluations completedand CIR consulted for follow up therapy.   Patient discharged  to SNF after CIR stay in 07/17 due to lack of social supports. She has been going to outpatient PT twice a week since Oct. 2017. Patient was medically ready for discharge on 12/30/16 and went to [facility] for continued therapies. We had received a denial for acute inpatient rehab admission. The denial was overturned on expedited appeal. We are re-admitting to acute inpatient rehab today from [facility]. Patient transferred to CIR on 01/06/2017 .   Patient currently requires min with mobility secondary to muscle weakness, decreased cardiorespiratoy endurance, unbalanced muscle activation, ataxia and decreased coordination, decreased problem solving, decreased safety awareness and decreased memory and decreased standing balance, decreased postural control and decreased balance strategies.  Prior to hospitalization, patient was modified independent  with mobility and lived with Alone in a [place] home.  Home access is  Other (comment) (Stair lift).  Patient will benefit from skilled PT intervention to maximize safe functional mobility and minimize fall risk for planned discharge home with intermittent S..  Anticipate patient will benefit from follow up [place] at discharge.  PT - End of Session Activity Tolerance: Tolerates 30+ min activity with multiple rests Endurance Deficit: Yes Endurance Deficit Description: Requires rest breaks following short duration mobility tasks PT Assessment Rehab Potential (ACUTE/IP ONLY): Good Barriers to Discharge: Decreased caregiver support PT Patient demonstrates impairments in the following area(s): Balance;Safety;Endurance;Sensory;Motor;Pain PT Transfers Functional Problem(s): Bed Mobility;Bed to Chair;Car;Furniture PT Locomotion Functional Problem(s): Ambulation;Stairs;Wheelchair Mobility PT Plan PT Intensity: Minimum of 1-2 x/day ,45 to 90 minutes PT Frequency: 5 out of 7 days PT Duration Estimated Length of Stay: 5-7 days PT Treatment/Interventions:  Ambulation/gait training;Balance/vestibular training;Community reintegration;Discharge planning;Disease management/prevention;DME/adaptive equipment instruction;Functional mobility training;Neuromuscular re-education;Patient/family education;Pain management;Therapeutic Exercise;Therapeutic Activities;UE/LE Coordination activities;Wheelchair propulsion/positioning;UE/LE Strength taining/ROM;Splinting/orthotics PT Transfers Anticipated Outcome(s): modI  PT Locomotion Anticipated Outcome(s): modI with LRAD in home, S in community PT Recommendation Recommendations for Other Services: Therapeutic Recreation consult Therapeutic Recreation Interventions: Kitchen group;Pet therapy;Outing/community reintergration Follow Up Recommendations: Home health PT Patient destination: Home Equipment Recommended: None recommended by PT Equipment Details: has RW  Skilled Therapeutic Intervention Pt received seated in bed, denies pain at rest but does report pain when ambulating but does not rate. PT initial evaluation performed and completed with minA overall as described below. Educated pt in rehab process, estimated LOS, goals, and fall prevention safety. Pt agreeable to all the above. Remained seated in w/c at end of session, all needs in reach.   PT Evaluation Precautions/Restrictions Precautions Precautions: Fall Required Braces or Orthoses: Other Brace/Splint Other Brace/Splint: Bil AFOs Restrictions Weight Bearing Restrictions: Yes RLE Weight Bearing: Weight bearing as tolerated LLE Weight Bearing: Weight bearing as tolerated General Chart Reviewed: Yes Response to Previous Treatment: Not applicable Family/Caregiver Present: No  Vital SignsTherapy Vitals Temp: 97.9 F (36.6 C) Temp Source: Oral Pulse Rate: 75 Resp: 18 BP: (!) 106/44 Patient Position (if appropriate): Sitting Oxygen Therapy SpO2: 98 % O2 Device: Not Delivered Pain Pain Assessment Pain Assessment: 0-10 Pain Score: 2  Pain  Type: Chronic pain Pain Location: Back Pain Intervention(s): Heat applied Home Living/Prior Functioning Home Living Available Help at Discharge: Family;Friend(s);Available PRN/intermittently Type of Home: [place]  Access: Other (comment) (Stair lift) Home Layout: One level Bathroom Shower/Tub: Walk-in shower Bathroom Toilet: Handicapped height Bathroom Accessibility: Yes  Lives With: Alone Vision/Perception    WFL Cognition Overall Cognitive Status: History of cognitive impairments - at baseline Arousal/Alertness: Awake/alert Memory: Impaired Memory Impairment: Decreased recall of new information;Decreased short term memory;Decreased long term memory Decreased Long Term Memory: Verbal basic;Functional basic Decreased Short Term Memory: Verbal basic;Functional basic Awareness: Impaired Awareness Impairment: Anticipatory impairment Problem Solving: Impaired Problem Solving Impairment: Verbal complex;Functional complex Executive Function: Decision Making Decision Making: Impaired Decision Making Impairment: Functional complex;Verbal complex Safety/Judgment: Impaired Comments: Decreased safety awareness/ problem solving and memory at bsaeline Sensation Sensation Light Touch: Appears Intact Coordination Gross Motor Movements are Fluid and Coordinated: No Fine Motor Movements are Fluid and Coordinated: Yes Coordination and Movement Description: Hx of incomplete paraperesis Motor  Motor Motor: Other (comment) Motor - Skilled Clinical Observations: Hx of incomplete parapesis  Mobility Bed Mobility Bed Mobility: Sit to Supine;Supine to Sit Supine to Sit: 5: Supervision;With rails Supine to Sit Details: Verbal cues for sequencing Sit to Supine: 5: Supervision Sit to Supine - Details: Verbal cues for precautions/safety Transfers Transfers: Yes Stand Pivot Transfers: 4: Min guard;4: Min assist Stand Pivot Transfer Details: Verbal cues for precautions/safety;Verbal cues  for technique;Verbal cues for safe use of DME/AE Locomotion  Ambulation Ambulation: Yes Ambulation/Gait Assistance: 4: Min guard Ambulation Distance (Feet): 30 Feet Assistive device: Rolling walker;Other (Comment) (B AFOs) Gait Gait: Yes Gait Pattern: Impaired Gait Pattern: Ataxic;Antalgic;Wide base of support;Poor foot clearance - right;Poor foot clearance - left;Decreased stride length;Decreased dorsiflexion - right;Decreased dorsiflexion - left Gait velocity: decreased for age/gender norms Stairs / Additional Locomotion Stairs: No Wheelchair Mobility Wheelchair Mobility: Yes Wheelchair Assistance: 5: Supervision Wheelchair Assistance Details: Verbal cues for technique Wheelchair Propulsion: Both upper extremities Wheelchair Parts Management: Needs assistance Distance: 150'  Trunk/Postural Assessment  Cervical Assessment Cervical Assessment: Within Functional Limits Thoracic Assessment Thoracic Assessment: Exceptions to WFL Lumbar Assessment Lumbar Assessment: Exceptions to WFL (Posterior pelvic tilt) Postural Control Postural Control: Within Functional Limits  Balance Balance Balance Assessed: Yes Static Standing Balance Static Standing - Balance Support: During functional activity Static Standing - Level of Assistance: 5: Stand by assistance Dynamic Standing Balance Dynamic Standing - Balance Support: During functional activity;Right upper extremity supported;Left upper extremity supported Dynamic Standing - Level of Assistance: 4: Min assist Dynamic Standing - Comments: With support of RW Extremity Assessment  RUE Assessment RUE Assessment: Within Functional Limits LUE Assessment LUE Assessment: Within Functional Limits RLE Assessment RLE Assessment: Exceptions to WFL (hip flexion 2/5, knee extension 4-/5, knee flexion 4-/5, ankle dorsiflexion 1/5) LLE Assessment LLE Assessment: Exceptions to WFL (hip flexion 4/5, knee extension 4/5, knee flexion 4-/5, ankle  dorsiflexion 2/5)   See Function Navigator for Current Functional Status.   Refer to Care Plan for Long Term Goals  Recommendations for other services: Therapeutic Recreation  Pet therapy, Kitchen group and Outing/community reintegration  Discharge Criteria: Patient will be discharged from PT if patient refuses treatment 3 consecutive times without medical reason, if treatment goals not met, if there is a change in medical status, if patient makes no progress towards goals or if patient is discharged from hospital.  The above assessment, treatment plan, treatment alternatives and goals were discussed and mutually agreed upon: by patient   J Tygielski 01/07/2017, 12:55 PM  

## 2017-01-07 NOTE — Progress Notes (Signed)
Physical Therapy Session Note  Patient Details  Name: [name] Weber MRN: [id number] Date of Birth: 03/13/1931  Today's Date: 01/07/2017 PT Individual Time: 1620-1700 PT Individual Time Calculation (min): 40 min   Short Term Goals: Week 1:  PT Short Term Goal 1 (Week 1): =LTG due to estimated LOS  Skilled Therapeutic Interventions/Progress Updates:  Pt received sitting in WC and agreeable to PT  [facility]  [facility] instructed pt in Stair negotiation x 12 (6" and 3") with min assist from PT. Patient noted to have self selected step to gait pattern.    WC mobility with supervision assist x 14450ft through hall with min cues for doorway management.   Gait up/down 4210ft ramp and over 7110ft mulched surface with RW. 3 rest breaks given due pain in R hip.   Gait back to room x 13330ft with RW, BAFO,and min assist for safety.    Bed mobility to supine with supervision assist from PT with min cues for technique.   Pt left supine in bed with call bell in reach and bed alarm on.      Therapy Documentation Precautions:  Precautions Precautions: Fall Required Braces or Orthoses: Other Brace/Splint Other Brace/Splint: Bil AFOs Restrictions Weight Bearing Restrictions: Yes RLE Weight Bearing: Weight bearing as tolerated LLE Weight Bearing: Weight bearing as tolerated General: Chart Reviewed: Yes Response to Previous Treatment: Not applicable Family/Caregiver Present: No Vital Signs: Therapy Vitals Temp: 97.9 F (36.6 C) Temp Source: Oral Pulse Rate: 75 Resp: 18 BP: (!) 106/44 Patient Position (if appropriate): Sitting Oxygen Therapy SpO2: 98 % O2 Device: Not Delivered Pain: Pain Assessment Pain Assessment: 0-10 Pain Score: 8 Pain Type: aching  Pain Location: R hip  Pain Intervention(s): Medication (See eMAR) RN made aware.  Mobility:   See Function Navigator for Current Functional Status.   Therapy/Group: Individual Therapy  Golden Popustin E Shakeya Kerkman 01/07/2017, 5:37  PM

## 2017-01-08 ENCOUNTER — Inpatient Hospital Stay (HOSPITAL_COMMUNITY): Payer: Medicare Other | Admitting: Occupational Therapy

## 2017-01-08 ENCOUNTER — Inpatient Hospital Stay (HOSPITAL_COMMUNITY): Payer: Medicare Other | Admitting: Physical Therapy

## 2017-01-08 DIAGNOSIS — I482 Chronic atrial fibrillation: Secondary | ICD-10-CM

## 2017-01-08 DIAGNOSIS — S32810S Multiple fractures of pelvis with stable disruption of pelvic ring, sequela: Secondary | ICD-10-CM

## 2017-01-08 DIAGNOSIS — I824Z1 Acute embolism and thrombosis of unspecified deep veins of right distal lower extremity: Secondary | ICD-10-CM

## 2017-01-08 DIAGNOSIS — G839 Paralytic syndrome, unspecified: Secondary | ICD-10-CM

## 2017-01-08 MED ORDER — ENOXAPARIN SODIUM 40 MG/0.4ML ~~LOC~~ SOLN
40.0000 mg | SUBCUTANEOUS | Status: DC
  Administered 2017-01-09 – 2017-01-13 (×5): 40 mg via SUBCUTANEOUS
  Filled 2017-01-08 (×5): qty 0.4

## 2017-01-08 MED ORDER — SENNOSIDES-DOCUSATE SODIUM 8.6-50 MG PO TABS
1.0000 | ORAL_TABLET | Freq: Two times a day (BID) | ORAL | Status: DC
  Administered 2017-01-08 – 2017-01-13 (×3): 1 via ORAL
  Filled 2017-01-08 (×6): qty 1

## 2017-01-08 MED ORDER — ENSURE ENLIVE PO LIQD
237.0000 mL | Freq: Two times a day (BID) | ORAL | Status: DC
  Administered 2017-01-08 – 2017-01-13 (×6): 237 mL via ORAL

## 2017-01-08 NOTE — Progress Notes (Signed)
Physical Therapy Session Note  Patient Details  Name: [name] Weber MRN: [id number] Date of Birth: 05/25/1931  Today's Date: 01/08/2017 PT Individual Time: 1100-1200 and [id number] PT Individual Time Calculation (min): 60 min and 30 min (total 90 min)   Short Term Goals: Week 1:  PT Short Term Goal 1 (Week 1): =LTG due to estimated LOS  Skilled Therapeutic Interventions/Progress Updates: Tx 1: Pt received seated in w/c, c/o pain as below in low back R side, agreeable to treatment. Gait with RW and close S x140' on level surface; seated rest break due to fatigue after trial. Stand pivot transfer w/c <>mat table x4 during session with min guard, no AD. Seated hamstring and hip ER stretches 2x1 min each LE for each stretch. Sit <>supine on mat with S and increased time d/t pain. Bridging x10 reps, hip abduction with light orange theraband isometric d/t strength deficits 1x10 reps, 1 set 10 reps hip abduction isometric combined with bridging. Provided pt with handout for supine/sitting LE exercises to perform in room outside of regular therapy time. W/c propulsion to room with BUE and S for strengthening and endurance. Remained seated in w/c with friends present, all needs in reach at end of session.   Tx 2: Pt received seated in w/c, c/o pain as below and agreeable to treatment. W/c propulsion to gym for UE strengthening and endurance. Stand pivot transfer with RW w/c <>nustep. Performed nustep x10 min total with BUE/BLE for strengthening and endurance; utilized RLE support to maintain hip in neutral alignment. Ambulated towards stairs with RW however noted significant R foot drag indicative of increased fatigue compared to AM session, and stairs held at this time for safety. W/c propulsion to room with BUEs; remained seated in w/c with all needs in reach at end of session.      Therapy Documentation Precautions:  Precautions Precautions: Fall Required Braces or Orthoses: Other Brace/Splint Other  Brace/Splint: Bil AFOs Restrictions Weight Bearing Restrictions: Yes RLE Weight Bearing: Weight bearing as tolerated LLE Weight Bearing: Weight bearing as tolerated Pain: Pain Assessment Pain Assessment: 0-10 Pain Score: 3  Pain Intervention(s): Heat applied   See Function Navigator for Current Functional Status.   Therapy/Group: Individual Therapy  Vista Lawmanlizabeth J Tygielski 01/08/2017, 12:12 PM

## 2017-01-08 NOTE — IPOC Note (Signed)
Overall Plan of Care [facility](IPOC) Patient Details Name: [name] Weber MRN: [id number] DOB: 06/18/1931  Admitting Diagnosis: Pelvic fXS  Hospital Problems: Principal Problem:   Multiple closed fractures of pelvis with stable disruption of pelvic circle (HCC) Active Problems:   Paraplegia at T9 level (HCC)   A-fib (HCC)     Functional Problem List: Nursing Bladder, Bowel, Endurance, Medication Management, Motor, Nutrition, Pain, Perception, Safety, Sensory, Skin Integrity  PT Balance, Safety, Endurance, Sensory, Motor, Pain  OT Balance, Cognition, Endurance, Safety, Pain  SLP    TR         Basic ADL's: OT Bathing, Dressing, Grooming, Toileting     Advanced  ADL's: OT Simple Meal Preparation     Transfers: PT Bed Mobility, Bed to Chair, Car, Occupational psychologisturniture  OT Toilet, Research scientist (life sciences)Tub/Shower     Locomotion: PT Ambulation, Educational psychologisttairs, Psychologist, prison and probation servicesWheelchair Mobility     Additional Impairments: OT None  SLP        TR      Anticipated Outcomes Item Anticipated Outcome  Self Feeding Mod I  Swallowing      Basic self-care  Mod i  Toileting  Mod I   Bathroom Transfers Supervision shower; mod I toileting  Bowel/Bladder  continent of bladder and bowel with mod assist.  Transfers  modI   Locomotion  modI with LRAD in home, S in community  Communication     Cognition     Pain  less<3  Safety/Judgment  min assist.   Therapy Plan: PT Intensity: Minimum of 1-2 x/day ,45 to 90 minutes PT Frequency: 5 out of 7 days PT Duration Estimated Length of Stay: 5-7 days OT Intensity: Minimum of 1-2 x/day, 45 to 90 minutes OT Frequency: 5 out of 7 days OT Duration/Estimated Length of Stay: 5-7 days         Team Interventions: Nursing Interventions Patient/Family Education, Bladder Management, Bowel Management, Disease Management/Prevention, Pain Management, Medication Management, Skin Care/Wound Management, Cognitive Remediation/Compensation, Discharge Planning, Psychosocial Support  PT interventions  Ambulation/gait training, Warden/rangerBalance/vestibular training, Community reintegration, Discharge planning, Disease management/prevention, DME/adaptive equipment instruction, Functional mobility training, Neuromuscular re-education, Patient/family education, Pain management, Therapeutic Exercise, Therapeutic Activities, UE/LE Coordination activities, Wheelchair propulsion/positioning, UE/LE Strength taining/ROM, Splinting/orthotics  OT Interventions Warden/rangerBalance/vestibular training, Discharge planning, Community reintegration, Fish farm managerDME/adaptive equipment instruction, Functional mobility training, Pain management, Patient/family education, Self Care/advanced ADL retraining, Therapeutic Activities, Therapeutic Exercise, UE/LE Strength taining/ROM, UE/LE Coordination activities  SLP Interventions    TR Interventions    SW/CM Interventions Discharge Planning, Psychosocial Support, Patient/Family Education    Team Discharge Planning: Destination: PT-Home ,OT- Home , SLP-  Projected Follow-up: PT-Home health PT, OT-  Home health OT, SLP-  Projected Equipment Needs: PT-None recommended by PT, OT- To be determined, SLP-  Equipment Details: PT-has RW, OT-  Patient/family involved in discharge planning: PT- Patient,  OT-Patient, SLP-   MD ELOS: 5-7 days Medical Rehab Prognosis:  Excellent Assessment: The patient has been admitted for CIR therapies with the diagnosis of pelvic fractures after fall. The team will be addressing functional mobility, strength, stamina, balance, safety, adaptive techniques and equipment, self-care, bowel and bladder mgt, patient and caregiver education, pain mgt, community reintegration, long term living set up/plans, cognitive considerations. Goals have been set at mod I for mobility and self-care and min assist to supervision for safety and judgement. Consider neuro-psych involvement to assess for cognitive needs.Ranelle Oyster.    Zachary T. Swartz, MD, FAAPMR      See Team Conference Notes for weekly  updates to the plan of care

## 2017-01-08 NOTE — Progress Notes (Signed)
[facility] PHYSICAL MEDICINE & REHABILITATION     PROGRESS NOTE    Subjective/Complaints: Had a reasonable night. Moving bowels. Still having pain in right pelvis--using tylenol for relief  ROS: pt denies nausea, vomiting, diarrhea, cough, shortness of breath or chest pain   Objective: Vital Signs: Blood pressure (!) 126/50, pulse 65, temperature 97.8 F (36.6 C), temperature source Oral, resp. rate 18, height 5\' 8"  (1.727 m), weight 47.5 kg (104 lb 11.2 oz), SpO2 98 %. No results found.  Recent Labs  01/07/17 0515  WBC 6.9  HGB 12.0  HCT 37.4  PLT 295    Recent Labs  01/07/17 0515  NA 140  K 4.2  CL 102  GLUCOSE 98  BUN 22*  CREATININE 0.60  CALCIUM 8.9   CBG (last 3)  No results for input(s): GLUCAP in the last 72 hours.  Wt Readings from Last 3 Encounters:  01/06/17 47.5 kg (104 lb 11.2 oz)  01/06/17 54.9 kg (121 lb 0.5 oz)  12/28/16 54.9 kg (121 lb 0.5 oz)    Physical Exam:   Constitutional:  Frail appearing. No distress HENT:  Head: Normocephalic and atraumatic.  Eyes: PERRL  Neck: Normal range of motion. Neck supple. No thyromegaly present.  Cardiovascular:  IRR/IRR Respiratory: clear bilaterally GI: Soft. Bowel sounds are normal. She exhibits no distension. There is no tenderness.  Musculoskeletal: She exhibits tenderness. She exhibits no edema.  Bilateral heel cords tight Pain with range of motion right greater than left hip.  unable to lift right hip against gravity while seated Neurological: She is alert and oriented to person, place, and time.  Follows commands Sensation intact to light touch except for distal LE's Speech clear.  Motor: B/l UE 5/5 proximal to distal RLE: HF 2-/5, KE 4/5, ADF/PF 2/5 with contracture LLE: HF 3+/5, KE 4+/5, ADF/PF 2/5 with contracture   Skin: Skin is warm and dry.  Psychiatric: She has a normal mood and affect. Her behavior is normal. Thought content normal.    Assessment/Plan: 1. Functional and  mobility deficits secondary to pelvic fractures after fall which require 3+ hours per day of interdisciplinary therapy in a comprehensive inpatient rehab setting. Physiatrist is providing close team supervision and 24 hour management of active medical problems listed below. Physiatrist and rehab team continue to assess barriers to discharge/monitor patient progress toward functional and medical goals.  Function:  Bathing Bathing position   Position: Shower  Bathing parts Body parts bathed by patient: Right arm, Left arm, Abdomen, Chest, Front perineal area, Buttocks, Right upper leg, Left upper leg, Right lower leg, Left lower leg Body parts bathed by helper: Back  Bathing assist Assist Level: Touching or steadying assistance(Pt > 75%)      Upper Body Dressing/Undressing Upper body dressing   What is the patient wearing?: Pull over shirt/dress     Pull over shirt/dress - Perfomed by patient: Thread/unthread right sleeve, Thread/unthread left sleeve, Put head through opening, Pull shirt over trunk          Upper body assist Assist Level: Set up   Set up : To obtain clothing/put away  Lower Body Dressing/Undressing Lower body dressing   What is the patient wearing?: Pants, Non-skid slipper socks, Shoes, AFO     Pants- Performed by patient: Thread/unthread right pants leg, Thread/unthread left pants leg, Pull pants up/down   Non-skid slipper socks- Performed by patient: Don/doff right sock, Don/doff left sock   Socks - Performed by patient: Don/doff right sock, Don/doff left sock  Shoes - Performed by patient: Don/doff right shoe, Don/doff left shoe   AFO - Performed by patient: Don/doff right AFO, Don/doff left AFO        Lower body assist Assist for lower body dressing: Touching or steadying assistance (Pt > 75%)   Set up : To obtain clothing/put away  Toileting Toileting   Toileting steps completed by patient: Adjust clothing prior to toileting, Performs perineal  hygiene, Adjust clothing after toileting      Toileting assist Assist level: Touching or steadying assistance (Pt.75%)   Transfers Chair/bed transfer   Chair/bed transfer method: Stand pivot Chair/bed transfer assist level: Touching or steadying assistance (Pt > 75%) Chair/bed transfer assistive device: Patent attorneyWalker     Locomotion Ambulation     Max distance: 130 Assist level: Touching or steadying assistance (Pt > 75%)   Wheelchair   Type: Manual Max wheelchair distance: 16150ft  Assist Level: Supervision or verbal cues  Cognition Comprehension Comprehension assist level: Follows basic conversation/direction with extra time/assistive device  Expression Expression assist level: Expresses basic needs/ideas: With extra time/assistive device  Social Interaction Social Interaction assist level: Interacts appropriately 90% of the time - Needs monitoring or encouragement for participation or interaction.  Problem Solving Problem solving assist level: Solves basic 75 - 89% of the time/requires cueing 10 - 24% of the time  Memory Memory assist level: Recognizes or recalls 50 - 74% of the time/requires cueing 25 - 49% of the time, Recognizes or recalls 25 - 49% of the time/requires cueing 50 - 75% of the time   Medical Problem List and Plan: 1.  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 Weightbearing as tolerated. AFO braces.             -begin CIR therapies  -will need to be discussing with patient long term living situation. Pt is aware of the need. We reviewed today 2.  DVT Prophylaxis/Anticoagulation: right soleal DVT  -lovenox 30mg  daily  -recheck dopplers next week 3. Pain Management: Hydrocodone as needed.  -kpad,ice 4. Mood: Provide emotional support 5. Neuropsych: This patient is capable of making decisions on her own behalf. 6. Skin/Wound Care: Routine skin checks 7.  Fluids/Electrolytes/Nutrition: encourage PO, especially fluids.    -pt would like supp shake between meals  -recheck labs monday.  8. Atrial fibrillation. Lanoxin 0.125 mg daily. Cardiac rate control. Continue aspirin therapy 9. Constipation. Laxative assistance 10. Hypoalbuminemia: Albumin ordered, supps as tolerated  11. Thrombocytopenia: Follow CBC 12. Severe osteoporosis: Monitor for further pathological fractures    LOS (Days) 2 A FACE TO FACE EVALUATION WAS PERFORMED  Ranelle OysterSWARTZ,Curtis Cain T, MD 01/08/2017 9:12 AM

## 2017-01-08 NOTE — Progress Notes (Signed)
Occupational Therapy Session Note  Patient Details  Name: Stephani Policevelyn C Verley MRN: [id number] Date of Birth: 01/26/1931  Today's Date: 01/08/2017 OT Individual Time: [id number] and [id number] OT Individual Time Calculation (min): 71 min and 32 min    Short Term Goals: Week 1:  OT Short Term Goal 1 (Week 1): STG= LTG due to LOS  Skilled Therapeutic Interventions/Progress Updates:    Session One: Pt seen for OT session focusing on ADL and IADL re-training Pt sitting up in w/c upon arrival, voicing need for toileting task. Denied placing AFOs before ambulating into bathroom. She ambulated with RW and close supervision, steadying assist required for standing toileting tasks due to posterior LOB and assist for controlled descent onto low toilet.  She bathed seated in w/c at sink with set-up assist, dressed seated in w/c, standing at sink with UE steadying assist to pull pants up. In ADL apartment, completed kitchen mobility, managing RW while also transporting and retrieving items. Pt provided with walker bag. She required cont VCs for RW management in functional context. Pt with limited activity tolerance today due to pain in R hip/ lower back. RN aware, hwoever, not yet time for next pain med dose.  Pt ambulated from ADL apartment to room with supervision using RW. Pt left seated in w/c at end of session, k-pad applied, and all needs in reach.  Reviewed with pt PT/OT goals and recommendations for intermittent supervision and supervision for showering tasks and simple meal prep. Also highly recommending life alert for pt, she voiced understanding of recommendations and stated friend and POA Val would be the one to provide needed assist.  Pt cont with STM difficultiues, however, is able to remember some things and not others. She was able to correctly identify who played and the winner of last nights big basketball game, however, 3x during session, pt asked about whether or not she could have heating pad  (which she got yesterday morning).   Session Two: Pt seen for OT session focusing on functional activity tolerance and ambulation. Pt sitting up in w/c upon arrival, ready for tx session. Throughout session, she ambulated with supervision using RW. Required min VCs for RW management in functional context. She watered plants in therapy day room, demonstrating carry over of education/ good problem solving for transporting watering can while managing RW. Min VCs for safety awareness. Seated rest breaks required throughout. She ambulated back to room at end of session with one seated rest break. Pt left sitting up in w/c at end of session, volunteer chaplain present to serve communion.   Therapy Documentation Precautions:  Precautions Precautions: Fall Required Braces or Orthoses: Other Brace/Splint Other Brace/Splint: Bil AFOs Restrictions Weight Bearing Restrictions: Yes RLE Weight Bearing: Weight bearing as tolerated LLE Weight Bearing: Weight bearing as tolerated  See Function Navigator for Current Functional Status.   Therapy/Group: Individual Therapy  Lewis, Akiel Fennell C 01/08/2017, 7:08 AM

## 2017-01-08 NOTE — Progress Notes (Signed)
Patient information reviewed and entered into eRehab system by Raiquan Chandler, RN, CRRN, PPS Coordinator.  Information including medical coding and functional independence measure will be reviewed and updated through discharge.     Per nursing patient was given "Data Collection Information Summary for Patients in Inpatient Rehabilitation Facilities with attached "Privacy Act Statement-Health Care Records" upon admission.  

## 2017-01-09 ENCOUNTER — Inpatient Hospital Stay (HOSPITAL_COMMUNITY): Payer: Medicare Other | Admitting: Occupational Therapy

## 2017-01-09 ENCOUNTER — Inpatient Hospital Stay (HOSPITAL_COMMUNITY): Payer: Medicare Other | Admitting: Physical Therapy

## 2017-01-09 DIAGNOSIS — R52 Pain, unspecified: Secondary | ICD-10-CM

## 2017-01-09 DIAGNOSIS — K5901 Slow transit constipation: Secondary | ICD-10-CM

## 2017-01-09 NOTE — Progress Notes (Signed)
Physical Therapy Session Note  Patient Details  Name: [name] Weber MRN: [id number] Date of Birth: 09/22/1931  Today's Date: 01/09/2017 PT Individual Time: 1000-1100 PT Individual Time Calculation (min): 60 min   Short Term Goals: Week 1:  PT Short Term Goal 1 (Week 1): =LTG due to estimated LOS  Skilled Therapeutic Interventions/Progress Updates:  Pt was seen bedside in the am. Pt performed all transfers with rolling walker and c/s to min guard. Pt ambulated 200 feet x 2 with rolling walker, B AFOs and c/s to min guard. Pt performed step taps and alternating step taps, 3 sets x 10 reps each. Pt also performed 3 sets x 5 reps minim squats and STSs. Pt returned to room and left sitting up in w/c with family at bedside.   Therapy Documentation Precautions:  Precautions Precautions: Fall Required Braces or Orthoses: Other Brace/Splint Other Brace/Splint: Bil AFOs Restrictions Weight Bearing Restrictions: Yes RLE Weight Bearing: Weight bearing as tolerated LLE Weight Bearing: Weight bearing as tolerated General:   Pain: Pt c/o 6/10 pain R hip.  See Function Navigator for Current Functional Status.   Therapy/Group: Individual Therapy  Rayford HalstedMitchell, Gaynel Schaafsma G 01/09/2017, 7:56 AM

## 2017-01-09 NOTE — Progress Notes (Signed)
[facility] [facility] PHYSICAL MEDICINE & REHABILITATION     PROGRESS NOTE    Subjective/Complaints: Pt sitting up in bed this AM.  She slept well overnight.   ROS: Denies nausea, vomiting, diarrhea, shortness of breath or chest pain  Objective: Vital Signs: Blood pressure (!) 112/52, pulse 71, temperature 98.5 F (36.9 C), temperature source Oral, resp. rate 18, height 5\' 8"  (1.727 m), weight 47.5 kg (104 lb 11.2 oz), SpO2 99 %. No results found.  Recent Labs  01/07/17 0515  WBC 6.9  HGB 12.0  HCT 37.4  PLT 295    Recent Labs  01/07/17 0515  NA 140  K 4.2  CL 102  GLUCOSE 98  BUN 22*  CREATININE 0.60  CALCIUM 8.9   CBG (last 3)  No results for input(s): GLUCAP in the last 72 hours.  Wt Readings from Last 3 Encounters:  01/06/17 47.5 kg (104 lb 11.2 oz)  01/06/17 54.9 kg (121 lb 0.5 oz)  12/28/16 54.9 kg (121 lb 0.5 oz)    Physical Exam:  Constitutional: Frail appearing. No distress.  HENT: Normocephalic and atraumatic.  Cardiovascular: IRRR Respiratory: clear bilaterally. Unlabored. GI: Soft. Bowel sounds are normal.  Musculoskeletal: She exhibits tenderness. She exhibits no edema.  Bilateral heel cords tight Pain with range of motion right greater than left hip. Neurological: She is alert and oriented.  Follows commands Sensation intact to light touch except for distal LE's Speech clear.  Motor: B/l UE 5/5 proximal to distal RLE: HF 2/5, KE 4/5, ADF/PF 2/5 with contracture LLE: HF 3+/5, KE 4+/5, ADF/PF 2/5 with contracture   Skin: Skin is warm and dry.  Psychiatric: She has a normal mood and affect. Her behavior is normal. Thought content normal.    Assessment/Plan: 1. Functional and mobility deficits secondary to pelvic fractures after fall which require 3+ hours per day of interdisciplinary therapy in a comprehensive inpatient rehab setting. Physiatrist is providing close team supervision and 24 hour management of active medical problems listed  below. Physiatrist and rehab team continue to assess barriers to discharge/monitor patient progress toward functional and medical goals.  Function:  Bathing Bathing position   Position: Wheelchair/chair at sink  Bathing parts Body parts bathed by patient: Right arm, Left arm, Abdomen, Chest, Front perineal area, Buttocks, Right upper leg, Left upper leg, Right lower leg, Left lower leg Body parts bathed by helper: Back  Bathing assist Assist Level: Supervision or verbal cues      Upper Body Dressing/Undressing Upper body dressing   What is the patient wearing?: Pull over shirt/dress     Pull over shirt/dress - Perfomed by patient: Thread/unthread right sleeve, Thread/unthread left sleeve, Put head through opening, Pull shirt over trunk          Upper body assist Assist Level: More than reasonable time   Set up : To obtain clothing/put away  Lower Body Dressing/Undressing Lower body dressing   What is the patient wearing?: Pants, Non-skid slipper socks, Shoes, AFO     Pants- Performed by patient: Thread/unthread right pants leg, Thread/unthread left pants leg, Pull pants up/down   Non-skid slipper socks- Performed by patient: Don/doff right sock, Don/doff left sock   Socks - Performed by patient: Don/doff right sock, Don/doff left sock   Shoes - Performed by patient: Don/doff right shoe, Don/doff left shoe   AFO - Performed by patient: Don/doff right AFO, Don/doff left AFO        Lower body assist Assist for lower body dressing: Touching or  steadying assistance (Pt > 75%)   Set up : To obtain clothing/put away  Toileting Toileting   Toileting steps completed by patient: Adjust clothing prior to toileting, Performs perineal hygiene, Adjust clothing after toileting   Toileting Assistive Devices: Grab bar or rail  Toileting assist Assist level: Touching or steadying assistance (Pt.75%)   Transfers Chair/bed transfer   Chair/bed transfer method: Stand  pivot Chair/bed transfer assist level: Touching or steadying assistance (Pt > 75%) Chair/bed transfer assistive device: Armrests     Locomotion Ambulation     Max distance: 140 Assist level: Supervision or verbal cues   Wheelchair   Type: Manual Max wheelchair distance: 17950ft  Assist Level: Supervision or verbal cues  Cognition Comprehension Comprehension assist level: Follows complex conversation/direction with extra time/assistive device  Expression Expression assist level: Expresses complex ideas: With extra time/assistive device  Social Interaction Social Interaction assist level: Interacts appropriately 90% of the time - Needs monitoring or encouragement for participation or interaction.  Problem Solving Problem solving assist level: Solves basic 90% of the time/requires cueing < 10% of the time  Memory Memory assist level: Recognizes or recalls 90% of the time/requires cueing < 10% of the time   Medical Problem List and Plan: 1.  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 Weightbearing as tolerated. AFO braces.             -Cont CIR   -will need to be discussing with patient long term living situation. Pt is aware of the need. 2.  DVT Prophylaxis/Anticoagulation: right soleal DVT  -lovenox 30mg  daily  -recheck dopplers next week 3. Pain Management: Hydrocodone as needed.  -kpad,ice 4. Mood: Provide emotional support 5. Neuropsych: This patient is capable of making decisions on her own behalf.  Appreciate Neuropsych eval 6. Skin/Wound Care: Routine skin checks 7. Fluids/Electrolytes/Nutrition: encourage PO, especially fluids.    -pt would like supp shake between meals  -recheck labs monday.  8. Atrial fibrillation. Lanoxin 0.125 mg daily. Cardiac rate control. Continue aspirin therapy 9. Constipation. Laxative assistance 10. Hypoalbuminemia: Albumin ordered, supps  as tolerated  11. Thrombocytopenia: Resolved 12. Severe osteoporosis: Monitor for further pathological fractures    LOS (Days) 3 A FACE TO FACE EVALUATION WAS PERFORMED  Ankit Karis JubaAnil Patel, MD 01/09/2017 8:21 AM

## 2017-01-09 NOTE — Progress Notes (Signed)
Occupational Therapy Session Note  Patient Details  Name: [name] MRN: [id number] Date of Birth: 08/21/1931  Today's Date: 01/09/2017 OT Individual Time: 0900-1000 and 1300-1330 OT Individual Time Calculation (min): 60 min and 30 min   Short Term Goals: Week 1:  OT Short Term Goal 1 (Week 1): STG= LTG due to LOS  Skilled Therapeutic Interventions/Progress Updates:    Session One: Pt seen for OT ADL bathing/dressing session. Pt sitting up in w/c upon arrival, agreeable to session and desiring bathing at shower level. She ambulated throughout session with RW and supervision. She bathed with distant supervision seated on tub bench, lateral leans for buttock hygiene. She dressed from w/c, standing at sink ledge to pull up pants. Able to don B shoes/ AFOs with min A. VCs to lock w/c during functional task.  Completed functional ambulation task for scavenger hunt throughout unit. Pt given written items to find and asked to rememeber items to locate while ambulating. Able to recall first 2 items she encountered, however, required total A to remember remaining 3 items. Pt with no more success when she created the list to recall. She ambulated throughout session with RW and supervision, occassional seated rest breaks required. Max VCs and use of signs to navigate back to room.  Pt returned to room at end of session, family members present and awaiting PT.   Session Two: Pt seen for OT session focusing on functional transfers and education with pt's POA/ friend. Pt sitting up in w/c upon arrival with caregiver preset. Agreeable to tx session. In ADL apartment, completed simulated shower stall transfer using RW. Set-up environment in simulation of home shower. Following demonstration for technique, completed trasnfer with min A to exit shower due to standing up from chair with armrests (home shower seat has armrests). Pt's friend reported they are looking to hire someone to assist with bathing at  d/c and have been in contact with CSW to find assist.  Reviewed extensively with pt's friend regarding pt's current level of function with biggest deficit being cognitive and recommendations made for home including supervision for shower transfers, use of BSC at bedside at night, and use of Life Alert type of communication/safety device for pt. Pt's caregiver voiced understanding, however, am not quite sure she understands severity of pt's cognitive impairments. Therapist provided her with examples ( forgetting she already had pain meds and asking for more meds, not being able to navigate back to room, etc.). And functional implications. She ambulated back to room at end of session with supervision. Left seated in w/c with K-pad applied, all needs in reach and friend present.    Therapy Documentation Precautions:  Precautions Precautions: Fall Required Braces or Orthoses: Other Brace/Splint Other Brace/Splint: Bil AFOs Restrictions Weight Bearing Restrictions: Yes RLE Weight Bearing: Weight bearing as tolerated LLE Weight Bearing: Weight bearing as tolerated Pain:   R hip discomfort, K-pad applied, repositioned  See Function Navigator for Current Functional Status.   Therapy/Group: Individual Therapy  Lewis, Shemaiah Round C 01/09/2017, 6:40 AM

## 2017-01-10 ENCOUNTER — Inpatient Hospital Stay (HOSPITAL_COMMUNITY): Payer: Medicare Other

## 2017-01-10 DIAGNOSIS — I4891 Unspecified atrial fibrillation: Secondary | ICD-10-CM

## 2017-01-10 DIAGNOSIS — I8290 Acute embolism and thrombosis of unspecified vein: Secondary | ICD-10-CM

## 2017-01-10 DIAGNOSIS — M545 Low back pain, unspecified: Secondary | ICD-10-CM

## 2017-01-10 DIAGNOSIS — M8080XD Other osteoporosis with current pathological fracture, unspecified site, subsequent encounter for fracture with routine healing: Secondary | ICD-10-CM

## 2017-01-10 DIAGNOSIS — M8080XA Other osteoporosis with current pathological fracture, unspecified site, initial encounter for fracture: Secondary | ICD-10-CM

## 2017-01-10 DIAGNOSIS — G8929 Other chronic pain: Secondary | ICD-10-CM

## 2017-01-10 DIAGNOSIS — M8448XD Pathological fracture, other site, subsequent encounter for fracture with routine healing: Secondary | ICD-10-CM

## 2017-01-10 NOTE — Progress Notes (Signed)
Physical Therapy Note  Patient Details  Name: [name] Weber MRN: [id number] Date of Birth: 01/10/1931 Today's Date: 01/10/2017  1130-1200, 30 min individual tx Pain:5/10 R pelvis; premedicated and [name] informed  Seated Therapeutic exercise performed with LEs to increase strength for functional mobility: R/L marching with assistance for R; long arc quad knee ext R/L with 3 second hold and emphasis on eccentric control; bil hip abduction/adduction .  Gait training with bil AFOs on, RW, x 120' forward, 20' each sidestepping R/L and retro gait backwards.  Min cues for sequencing laterally.  Standing balance activity at bulletin board in room, removing and replacing push pins with R hand without contralateral  UE support 8/8 pins, with L hand without contralateral support 6/8 pins.  Pt left resting in w/c with all needs within reach.  See function navigator for current status  [name] 01/10/2017, 8:30 AM

## 2017-01-10 NOTE — Progress Notes (Signed)
[place] PHYSICAL MEDICINE & REHABILITATION     PROGRESS NOTE    Subjective/Complaints: Pt seen laying in bed this AM.  She states she slept well overnight, but had some back pain.  She recently took meds, which usually help.    ROS: Denies nausea, vomiting, diarrhea, shortness of breath or chest pain  Objective: Vital Signs: Blood pressure 124/63, pulse 69, temperature 98.5 F (36.9 C), temperature source Oral, resp. rate 18, height 5\' 8"  (1.727 m), weight 47.5 kg (104 lb 11.2 oz), SpO2 98 %. No results found. No results for input(s): WBC, HGB, HCT, PLT in the last 72 hours. No results for input(s): NA, K, CL, GLUCOSE, BUN, CREATININE, CALCIUM in the last 72 hours.  Invalid input(s): CO CBG (last 3)  No results for input(s): GLUCAP in the last 72 hours.  Wt Readings from Last 3 Encounters:  01/06/17 47.5 kg (104 lb 11.2 oz)  01/06/17 54.9 kg (121 lb 0.5 oz)  12/28/16 54.9 kg (121 lb 0.5 oz)    Physical Exam:  Constitutional: Frail appearing. No distress.  HENT: Normocephalic and atraumatic.  Cardiovascular: IRRR Respiratory: clear bilaterally. Unlabored. GI: Soft. Bowel sounds are normal.  Musculoskeletal: She exhibits tenderness. She exhibits no edema.  Bilateral heel cords tight Pain with range of motion right greater than left hip. Neurological: She is alert and oriented.  Follows commands Sensation intact to light touch except for distal LE's Speech clear.  Motor: B/l UE 5/5 proximal to distal RLE: HF 2/5, KE 4/5, ADF/PF 2/5 with contracture (unchanged) LLE: HF 3+/5, KE 4+/5, ADF/PF 2/5 with contracture (unchanged) Skin: Skin is warm and dry.  Psychiatric: She has a normal mood and affect. Her behavior is normal. Thought content normal.    Assessment/Plan: 1. Functional and mobility deficits secondary to pelvic fractures after fall which require 3+ hours per day of interdisciplinary therapy in a comprehensive inpatient rehab setting. Physiatrist is providing  close team supervision and 24 hour management of active medical problems listed below. Physiatrist and rehab team continue to assess barriers to discharge/monitor patient progress toward functional and medical goals.  Function:  Bathing Bathing position   Position: Shower  Bathing parts Body parts bathed by patient: Right arm, Left arm, Abdomen, Chest, Front perineal area, Buttocks, Right upper leg, Left upper leg, Right lower leg, Left lower leg, Back Body parts bathed by helper: Back  Bathing assist Assist Level: Supervision or verbal cues      Upper Body Dressing/Undressing Upper body dressing   What is the patient wearing?: Pull over shirt/dress     Pull over shirt/dress - Perfomed by patient: Thread/unthread right sleeve, Thread/unthread left sleeve, Put head through opening, Pull shirt over trunk          Upper body assist Assist Level: More than reasonable time   Set up : To obtain clothing/put away  Lower Body Dressing/Undressing Lower body dressing   What is the patient wearing?: Pants, Non-skid slipper socks, Shoes, AFO     Pants- Performed by patient: Thread/unthread right pants leg, Thread/unthread left pants leg, Pull pants up/down   Non-skid slipper socks- Performed by patient: Don/doff right sock, Don/doff left sock   Socks - Performed by patient: Don/doff right sock, Don/doff left sock   Shoes - Performed by patient: Don/doff right shoe, Don/doff left shoe   AFO - Performed by patient: Don/doff right AFO, Don/doff left AFO        Lower body assist Assist for lower body dressing: Supervision or verbal cues  Set up : To obtain clothing/put away  Toileting Toileting   Toileting steps completed by patient: Adjust clothing prior to toileting, Performs perineal hygiene, Adjust clothing after toileting   Toileting Assistive Devices: Grab bar or rail  Toileting assist Assist level: Touching or steadying assistance (Pt.75%)   Transfers Chair/bed transfer    Chair/bed transfer method: Stand pivot Chair/bed transfer assist level: Touching or steadying assistance (Pt > 75%) Chair/bed transfer assistive device: Armrests, Patent attorneyWalker     Locomotion Ambulation     Max distance: 200 Assist level: Supervision or verbal cues   Wheelchair   Type: Manual Max wheelchair distance: 15650ft  Assist Level: Supervision or verbal cues  Cognition Comprehension Comprehension assist level: Follows complex conversation/direction with extra time/assistive device  Expression Expression assist level: Expresses complex ideas: With extra time/assistive device  Social Interaction Social Interaction assist level: Interacts appropriately 90% of the time - Needs monitoring or encouragement for participation or interaction.  Problem Solving Problem solving assist level: Solves basic 90% of the time/requires cueing < 10% of the time  Memory Memory assist level: Recognizes or recalls 90% of the time/requires cueing < 10% of the time   Medical Problem List and Plan: 1.  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 Weightbearing as tolerated. AFO braces.             -Cont CIR   -will need to be discussing with patient long term living situation. Pt is aware of the need. 2.  DVT Prophylaxis/Anticoagulation: right soleal thrombus  -lovenox 30mg  daily  -recheck dopplers this week 3. Pain Management: Hydrocodone as needed.  -kpad,ice 4. Mood: Provide emotional support 5. Neuropsych: This patient is capable of making decisions on her own behalf.  Appreciate Neuropsych eval 6. Skin/Wound Care: Routine skin checks 7. Fluids/Electrolytes/Nutrition: encourage PO, especially fluids.  -pt would like supp shake between meals  -recheck labs tomorrow.  8. Atrial fibrillation. Lanoxin 0.125 mg daily. Cardiac rate control. Continue aspirin therapy 9. Constipation. Laxative  assistance 10. Hypoalbuminemia: Albumin ordered, supps as tolerated  11. Thrombocytopenia: Resolved 12. Severe osteoporosis: Monitor for further pathological fractures    LOS (Days) 4 A FACE TO FACE EVALUATION WAS PERFORMED  Jamari Diana Karis JubaAnil Cordero Surette, MD 01/10/2017 8:41 AM

## 2017-01-10 NOTE — Progress Notes (Signed)
Physical Therapy Session Note  Patient Details  Name: Stephani Policevelyn C Stauch MRN: [id number] Date of Birth: 04/03/1931  Today's Date: 01/09/2017 PT Individual Time:1616-1635   PT Individual Time Calculation: 19 min   Short Term Goals: Week 1:  PT Short Term Goal 1 (Week 1): =LTG due to estimated LOS  Skilled Therapeutic Interventions/Progress Updates:   Pt received sitting in WC and agreeable to PT. But states that she is in a lot of pain. Gait in room x5015ft with increasing pain. Patient requests to rest due to pain. Bed mobility to supine in bed with min assist from PT with min cues for safety. Pt able to doff BAFO with supervision assist. Patient left supine in bed with call bell in reach and bed alarm set .       Therapy Documentation Precautions:  Precautions Precautions: Fall Required Braces or Orthoses: Other Brace/Splint Other Brace/Splint: Bil AFOs Restrictions Weight Bearing Restrictions: No RLE Weight Bearing: Weight bearing as tolerated LLE Weight Bearing: Weight bearing as tolerated General:   Vital Signs: Therapy Vitals Temp: 98.5 F (36.9 C) Temp Source: Oral Pulse Rate: 69 Resp: 18 BP: 124/63 Patient Position (if appropriate): Lying Oxygen Therapy SpO2: 98 % O2 Device: Not Delivered   See Function Navigator for Current Functional Status.   Therapy/Group: Individual Therapy  Golden Popustin E Danny Zimny 01/10/2017, 6:11 AM

## 2017-01-11 ENCOUNTER — Inpatient Hospital Stay (HOSPITAL_COMMUNITY): Payer: Medicare Other | Admitting: Physical Therapy

## 2017-01-11 ENCOUNTER — Ambulatory Visit (HOSPITAL_REHABILITATION): Payer: Medicare Other | Admitting: Psychology

## 2017-01-11 ENCOUNTER — Inpatient Hospital Stay (HOSPITAL_COMMUNITY): Payer: Medicare Other | Admitting: Occupational Therapy

## 2017-01-11 DIAGNOSIS — S32001S Stable burst fracture of unspecified lumbar vertebra, sequela: Secondary | ICD-10-CM

## 2017-01-11 LAB — BASIC METABOLIC PANEL
Anion gap: 9 (ref 5–15)
BUN: 16 mg/dL (ref 6–20)
CO2: 27 mmol/L (ref 22–32)
Calcium: 8.7 mg/dL — ABNORMAL LOW (ref 8.9–10.3)
Chloride: 100 mmol/L — ABNORMAL LOW (ref 101–111)
Creatinine, Ser: 0.5 mg/dL (ref 0.44–1.00)
GFR calc Af Amer: >60 mL/min (ref >60)
GFR calc non Af Amer: >60 mL/min (ref >60)
Glucose, Bld: 97 mg/dL (ref 65–99)
Potassium: 4 mmol/L (ref 3.5–5.1)
Sodium: 136 mmol/L (ref 135–145)

## 2017-01-11 LAB — CBC
HCT: 36.9 % (ref 36.0–46.0)
Hemoglobin: 12 g/dL (ref 12.0–15.0)
MCH: 32.2 pg (ref 26.0–34.0)
MCHC: 32.5 g/dL (ref 30.0–36.0)
MCV: 98.9 fL (ref 78.0–100.0)
Platelets: 319 10*3/uL (ref 150–400)
RBC: 3.73 MIL/uL — ABNORMAL LOW (ref 3.87–5.11)
RDW: 12.6 % (ref 11.5–15.5)
WBC: 10 10*3/uL (ref 4.0–10.5)

## 2017-01-11 MED ORDER — LOPERAMIDE HCL 2 MG PO CAPS
4.0000 mg | ORAL_CAPSULE | ORAL | Status: DC | PRN
  Administered 2017-01-11: 4 mg via ORAL
  Filled 2017-01-11: qty 2

## 2017-01-11 NOTE — Progress Notes (Signed)
**Note De-Identified [name] Obfuscation** Social Work Patient ID: [name] Weber, female   DOB: 02/17/1931, 83 y.o.   MRN: [id number]   CSW talked with pt's POA, [name] Weber, 01-07-17 and 01-08-17 [name] telephone to update her on pt's goals and needs at d/c. CSW mentioned staff's concern re: pt's memory and orientation and being home alone.  [name] has not seen any cognitive changes in pt and feels she can continue to be managed at home with her and her sister's help.  CSW discussed the idea of hiring private caregivers to supplement what they feel they can do.    We also discussed the benefit of having a neuropsychology assessment while pt is here, so they could have a baseline of pt's cognition.  [name] questioned if this was the best time for this with her being on medications and we decided to ask Dr. Charm Barges opinion.  Dr. Naaman Plummer thought it would be helpful now and would tell us if/when further testing may be needed.  Pt was to see Dr. Sima Matas today and declined the visit, stating she would see him as an outpatient.  [name] wondered about follow up visits with Drs. Eddie Dibbles and Naaman Plummer scheduled for Wednesday and CSW told her that Dr. Naaman Plummer told her to postpone those.  PT/OT feel that may be the day pt is ready for d/c, so [name] will definitely reschedule those two appointments.  CSW met with pt, [name], and [name] Weber (pt's other friend) in pt's room and they are committed to pt going home with intermittent supervision and assistance from them and paid caregiver.  They are proposing that they hire someone to come in the mornings to help her start her day and then again in the evenings to get ready for bed.  This would be in addition to them bringing her meals and checking on her regularly.  [name] assists with making sure pt takes her medications and will provide supervision when they are out in the community.    CSW gave pt and friends [employer] information and pt is agreeable to this.  [name] Weber is working  on Conservation officer, nature a caregiver.  Pt will decide with therapists between home health and outpt rehab.  CSW will continue to follow and assist as needed.

## 2017-01-11 NOTE — Progress Notes (Signed)
Occupational Therapy Session Note  Patient Details  Name: [name] Weber MRN: [id number] Date of Birth: 11/14/1931  Today's Date: 01/11/2017 OT Individual Time: 4132-4401 and 1415-1500 OT Individual Time Calculation (min): 60 min and 45 min   Short Term Goals: Week 1:  OT Short Term Goal 1 (Week 1): STG= LTG due to LOS  Skilled Therapeutic Interventions/Progress Updates:    Session One: Pt seen for OT ADL bathing/dressing session. Pt in supine upon arrival, voicing complaints of nausea and having had bad night due to constantly going to bathroom. RN made aware and was already aware of pt's condition and medication had already been administered. Pt agreeable to attempt therapy session as able.  She ambulated throughout room with RW and supervision, completing toileting tasks with steadying assist from grab bars. Made recommendation for use of 3-1 BSC over toilet for assist for sit <> stand, cont education to pt's caregiver will be needed due to pt's poor carryover of new information. She completed bathing/dressing seated in w/c at sink, completed sit <> stand at sink ledge for pericare/ buttock hygiene with VC required for sequencing and distant supervision. In ADL kitchen, practiced obtaining and replacing items from overhead cabinet. Focus on standing without UE support, completed with guarding assist while pt reached for weighted items and across midline to complete task.  She then completed bed mobility and simulated toileting task for transfer supine> stand pivot to [facility] as part of recommendation for home routine. Completed supervision-mod I. Pt returned to room at end of session, left sitting up in w/c, k-pad applied and all needs in reach. Reviewed with pt remainder of therapy schedule for the day. Pt adamently refusing visit from neuro psych stating "I refuse, there's nothing wrong with my mind or thinking and unless that man's going to help with my walking and standing I don't want to  see him". Reviewed need for physical and cognitive needs to ensure safe d/c home, however, pt cont to refuse. CSW made aware. Pt voiced not feeling ready for home, however, was unable to state what aspect she was concerned about stating " I just won't know until I get there". Reviewed recommendation from therapists for at least intermittent supervision, however, encouraged pt to think longer term of finding permanent help or seeking next venue of care. Pt very motivated to return Home.   Session Two: Pt seen for OT session focusing on d/c planning and safety awareness. Pt sitting up in w/c upon arrival, agreeable to tx session. She first ambulated throughout room and completed toileting task/ hand hygiene with close supervision, VC for RW management.  She self propelled w/c to therapy gym. At table top level completed home safety cards, correctly identifying safe and unsafe features from picture cards.  We then discussed plans for home health aid to assist with morning and evening routines. Wrote out scheduled routine as well as duties for home healthe aid and what pt is safe to complete independently. She was unable to recall recommendation for [facility] at bedside for night time use as discussed and practiced during A.M. tx session. Also reviewed possible need for assist with medication management due to poor memory, however, pt states that her memory is fine and can be independent with this task- explanation and example provided for times when pt unable to recall taking of meds and possible safety problems if missing or overdosing on medications.  Pt self propelled w/c to therapy gym at end of session and left with hand off to  PT.   Therapy Documentation Precautions:  Precautions Precautions: Fall Required Braces or Orthoses: Other Brace/Splint Other Brace/Splint: Bil AFOs Restrictions Weight Bearing Restrictions: No RLE Weight Bearing: Weight bearing as tolerated LLE Weight Bearing: Weight bearing as  tolerated  See Function Navigator for Current Functional Status.   Therapy/Group: Individual Therapy  Lewis, Mollyann Halbert C 01/11/2017, 7:03 AM

## 2017-01-11 NOTE — Progress Notes (Signed)
Patient refused to talk with me today.  She reports that she will come see me after she is discharged.  She had two of her friends there today.

## 2017-01-11 NOTE — Progress Notes (Signed)
[place] PHYSICAL MEDICINE & REHABILITATION     PROGRESS NOTE    Subjective/Complaints: Complains of nausea, "not doing well" this morning. Pain still in pelvis, right side  ROS: pt denies   vomiting, diarrhea, cough, shortness of breath or chest pain  Objective: Vital Signs: Blood pressure 125/63, pulse 82, temperature 99.5 F (37.5 C), temperature source Oral, resp. rate 18, height 5\' 8"  (1.727 m), weight 47.5 kg (104 lb 11.2 oz), SpO2 96 %. No results found.  Recent Labs  01/11/17 0441  WBC 10.0  HGB 12.0  HCT 36.9  PLT 319    Recent Labs  01/11/17 0441  NA 136  K 4.0  CL 100*  GLUCOSE 97  BUN 16  CREATININE 0.50  CALCIUM 8.7*   CBG (last 3)  No results for input(s): GLUCAP in the last 72 hours.  Wt Readings from Last 3 Encounters:  01/06/17 47.5 kg (104 lb 11.2 oz)  01/06/17 54.9 kg (121 lb 0.5 oz)  12/28/16 54.9 kg (121 lb 0.5 oz)    Physical Exam:  Constitutional: Frail appearing. No distress.  HENT: Normocephalic and atraumatic.  Cardiovascular: IRR/IRR Respiratory: CTA B, no wheezes GI: Soft. Bowel sounds are normal. Some pain in hypogastric region with palpation. Non-distended Musculoskeletal: She exhibits tenderness. She exhibits no edema.  Bilateral heel cords tight Pain with range of motion right greater than left hip. Neurological: She is alert and oriented.  Follows commands Sensation intact to light touch except for distal LE's Speech clear.  Motor: B/l UE 5/5 proximal to distal RLE: HF 2/5, KE 4/5, ADF/PF 2/5 with contracture (unchanged) LLE: HF 3+/5, KE 4+/5, ADF/PF 2/5 with contracture (unchanged) Skin: Skin is warm and dry.  Psychiatric: She has a normal mood and affect. Her behavior is normal. Thought content normal.    Assessment/Plan: 1. Functional and mobility deficits secondary to pelvic fractures after fall which require 3+ hours per day of interdisciplinary therapy in a comprehensive inpatient rehab setting. Physiatrist  is providing close team supervision and 24 hour management of active medical problems listed below. Physiatrist and rehab team continue to assess barriers to discharge/monitor patient progress toward functional and medical goals.  Function:  Bathing Bathing position   Position: Shower  Bathing parts Body parts bathed by patient: Right arm, Left arm, Abdomen, Chest, Front perineal area, Buttocks, Right upper leg, Left upper leg, Right lower leg, Left lower leg, Back Body parts bathed by helper: Back  Bathing assist Assist Level: Supervision or verbal cues      Upper Body Dressing/Undressing Upper body dressing   What is the patient wearing?: Pull over shirt/dress     Pull over shirt/dress - Perfomed by patient: Thread/unthread right sleeve, Thread/unthread left sleeve, Put head through opening, Pull shirt over trunk          Upper body assist Assist Level: More than reasonable time   Set up : To obtain clothing/put away  Lower Body Dressing/Undressing Lower body dressing   What is the patient wearing?: Pants, Non-skid slipper socks, Shoes, AFO     Pants- Performed by patient: Thread/unthread right pants leg, Thread/unthread left pants leg, Pull pants up/down   Non-skid slipper socks- Performed by patient: Don/doff right sock, Don/doff left sock   Socks - Performed by patient: Don/doff right sock, Don/doff left sock   Shoes - Performed by patient: Don/doff right shoe, Don/doff left shoe   AFO - Performed by patient: Don/doff right AFO, Don/doff left AFO  Lower body assist Assist for lower body dressing: Supervision or verbal cues   Set up : To obtain clothing/put away  Toileting Toileting   Toileting steps completed by patient: Adjust clothing prior to toileting, Performs perineal hygiene, Adjust clothing after toileting Toileting steps completed by helper: Adjust clothing prior to toileting, Adjust clothing after toileting Toileting Assistive Devices: Grab bar  or rail  Toileting assist Assist level: Touching or steadying assistance (Pt.75%)   Transfers Chair/bed transfer   Chair/bed transfer method: Ambulatory Chair/bed transfer assist level: Supervision or verbal cues Chair/bed transfer assistive device: Walker, Orthosis (bil AFOs)     Locomotion Ambulation     Max distance: 120 Assist level: Supervision or verbal cues   Wheelchair   Type: Manual Max wheelchair distance: 163ft  Assist Level: Supervision or verbal cues  Cognition Comprehension Comprehension assist level: Follows complex conversation/direction with extra time/assistive device  Expression Expression assist level: Expresses complex ideas: With extra time/assistive device  Social Interaction Social Interaction assist level: Interacts appropriately with others - No medications needed.  Problem Solving Problem solving assist level: Solves complex 90% of the time/cues < 10% of the time  Memory Memory assist level: Recognizes or recalls 90% of the time/requires cueing < 10% of the time   Medical Problem List and Plan: 1.  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 Weightbearing as tolerated. AFO braces.             -Cont CIR   - . 2.  DVT Prophylaxis/Anticoagulation: right soleal thrombus  -lovenox 30mg  daily  -recheck dopplers tomorrow 3. Pain Management: Hydrocodone as needed.  -kpad,ice 4. Mood: Provide emotional support 5. Neuropsych: This patient is capable of making decisions on her own behalf.  Appreciate Neuropsych eval 6. Skin/Wound Care: Routine skin checks 7. Fluids/Electrolytes/Nutrition: encourage PO, especially fluids.  -pt would like supp shake between meals  -bun with some improvement  -continue to push po.  8. Atrial fibrillation. Lanoxin 0.125 mg daily. Cardiac rate control. Continue aspirin therapy 9. Constipation. Laxative assistance 10.  Hypoalbuminemia:   supps as tolerated  11. Thrombocytopenia: Resolved 12. Severe osteoporosis: Monitor for further pathological fractures  13. Nausea--has been moving bowels. May be pain related too  -not using much hydrocodone  -observe today  -prn zofran   LOS (Days) 5 A FACE TO FACE EVALUATION WAS PERFORMED  Ranelle Oyster, MD 01/11/2017 8:53 AM

## 2017-01-11 NOTE — Progress Notes (Signed)
Physical Therapy Session Note  Patient Details  Name: [name] Weber MRN: [id number] Date of Birth: 02/11/1931  Today's Date: 01/11/2017 PT Individual Time: 1100-1200 and 1500-1530 PT Individual Time Calculation (min): 60 min and 30 min (total 90 min)   Short Term Goals: Week 1:  PT Short Term Goal 1 (Week 1): =LTG due to estimated LOS  Skilled Therapeutic Interventions/Progress Updates: Tx 1: Pt received seated in w/c, friends and CSW present. C/o abdominal discomfort but agreeable to treatment. Beginning of session with focus on education with pt and friends on equipment recommendations, home safety. Gait in/out of bathroom with RW and S; managed clothing and hygiene with modI. Transported to ADL apartment in w/c totalA for energy conservation. Ambulatory transfer w/c <>bed with RW and S. Sit <>supine with S and mod cues for technique due to pain and difficulty repositioning RLE. Gait trial 2x50' with RW and S; trial with BLE PLS AFOs to reduce energy expenditure, as pt currently using GRAFOs with increased weight and too much assist at B knees for stance. Pt has discomfort with LLE AFO, however similar brace style in forefoot to GRAFO and unable to determine cause of pain. Recommend moving forward with assessment from orthotic professional to assess need for change in brace style. Pt's friends verbalizing concern regarding pt's poor appetite and weight loss; recommended nutrition consul to RN. Remained seated in w/c at end of session, all needs in reach.   Tx 2: Pt received seated in gym with handoff from OT; denies pain and agreeable to treatment. Pt doffs BLE PLS AFOs with S, requires minA for donning due to difficulty getting heel into shoe. Recommended pt use long-handled shoe horn at home; none available in gym to trial at this time. Gait x15' with RW and S. Performed nustep x9 min with BUE/BLE on level 3 for strengthening and aerobic endurance. W/c propulsion to room with modI. Stand pivot  transfer w/c >bed with S and bedrails. Sit >supine with S. Remained supine in bed, alarm intact and all needs in reach at completion of session.      Therapy Documentation Precautions:  Precautions Precautions: Fall Required Braces or Orthoses: Other Brace/Splint Other Brace/Splint: Bil AFOs Restrictions Weight Bearing Restrictions: No RLE Weight Bearing: Weight bearing as tolerated LLE Weight Bearing: Weight bearing as tolerated   See Function Navigator for Current Functional Status.   Therapy/Group: Individual Therapy  Vista Lawmanlizabeth J Tygielski 01/11/2017, 12:04 PM

## 2017-01-12 ENCOUNTER — Inpatient Hospital Stay (HOSPITAL_COMMUNITY): Payer: Medicare Other

## 2017-01-12 ENCOUNTER — Inpatient Hospital Stay (HOSPITAL_COMMUNITY): Payer: Medicare Other | Admitting: Physical Therapy

## 2017-01-12 ENCOUNTER — Inpatient Hospital Stay (HOSPITAL_COMMUNITY): Payer: Medicare Other | Admitting: Occupational Therapy

## 2017-01-12 DIAGNOSIS — I8290 Acute embolism and thrombosis of unspecified vein: Secondary | ICD-10-CM

## 2017-01-12 NOTE — Progress Notes (Signed)
[place] PHYSICAL MEDICINE & REHABILITATION     PROGRESS NOTE    Subjective/Complaints: Nausea better today. Still has back/pelvic pain---k pad helps. Improving mobility. Refused neuropsych yesterday  ROS: pt denies nausea, vomiting, diarrhea, cough, shortness of breath or chest pain   Objective: Vital Signs: Blood pressure 125/66, pulse 75, temperature 98 F (36.7 C), temperature source Oral, resp. rate 17, height 5\' 8"  (1.727 m), weight 47.5 kg (104 lb 11.2 oz), SpO2 100 %. No results found.  Recent Labs  01/11/17 0441  WBC 10.0  HGB 12.0  HCT 36.9  PLT 319    Recent Labs  01/11/17 0441  NA 136  K 4.0  CL 100*  GLUCOSE 97  BUN 16  CREATININE 0.50  CALCIUM 8.7*   CBG (last 3)  No results for input(s): GLUCAP in the last 72 hours.  Wt Readings from Last 3 Encounters:  01/06/17 47.5 kg (104 lb 11.2 oz)  01/06/17 54.9 kg (121 lb 0.5 oz)  12/28/16 54.9 kg (121 lb 0.5 oz)    Physical Exam:  Constitutional: Frail appearing. No distress.  HENT: Normocephalic and atraumatic.  Cardiovascular: IRR Respiratory: cta GI: Soft. Bowel sounds are normal. Some pain in hypogastric region with palpation. Non-distended Musculoskeletal: She exhibits tenderness in the low back/pelvis. She exhibits no edema.  Bilateral heel cords tight Pain with range of motion right greater than left hip. Neurological: She is alert and oriented.  Follows commands Sensation intact to light touch except for distal LE's Speech clear.  Motor: B/l UE 5/5 proximal to distal RLE: HF 2/5, KE 4/5, ADF/PF 2/5 with contracture (stable) LLE: HF 3+/5, KE 4+/5, ADF/PF 2/5 with contracture (stable) Skin: Skin is warm and dry.  Psychiatric: She has a normal mood and affect. Her behavior is normal. Thought content normal.    Assessment/Plan: 1. Functional and mobility deficits secondary to pelvic fractures after fall which require 3+ hours per day of interdisciplinary therapy in a comprehensive  inpatient rehab setting. Physiatrist is providing close team supervision and 24 hour management of active medical problems listed below. Physiatrist and rehab team continue to assess barriers to discharge/monitor patient progress toward functional and medical goals.  Function:  Bathing Bathing position   Position: Shower  Bathing parts Body parts bathed by patient: Right arm, Left arm, Abdomen, Chest, Front perineal area, Buttocks, Right upper leg, Left upper leg, Right lower leg, Left lower leg, Back Body parts bathed by helper: Back  Bathing assist Assist Level: More than reasonable time      Upper Body Dressing/Undressing Upper body dressing   What is the patient wearing?: Pull over shirt/dress     Pull over shirt/dress - Perfomed by patient: Thread/unthread right sleeve, Thread/unthread left sleeve, Put head through opening, Pull shirt over trunk          Upper body assist Assist Level: More than reasonable time   Set up : To obtain clothing/put away  Lower Body Dressing/Undressing Lower body dressing   What is the patient wearing?: Underwear, Pants, Socks, Shoes, AFO Underwear - Performed by patient: Thread/unthread right underwear leg, Thread/unthread left underwear leg, Pull underwear up/down   Pants- Performed by patient: Thread/unthread right pants leg, Thread/unthread left pants leg, Pull pants up/down   Non-skid slipper socks- Performed by patient: Don/doff right sock, Don/doff left sock   Socks - Performed by patient: Don/doff right sock, Don/doff left sock   Shoes - Performed by patient: Don/doff right shoe, Don/doff left shoe   AFO - Performed by  patient: Don/doff right AFO, Don/doff left AFO        Lower body assist Assist for lower body dressing: More than reasonable time   Set up : To obtain clothing/put away  Toileting Toileting   Toileting steps completed by patient: Performs perineal hygiene, Adjust clothing prior to toileting, Adjust clothing  after toileting Toileting steps completed by helper: Adjust clothing prior to toileting, Adjust clothing after toileting Toileting Assistive Devices: Grab bar or rail  Toileting assist Assist level: More than reasonable time   Transfers Chair/bed transfer   Chair/bed transfer method: Ambulatory Chair/bed transfer assist level: Supervision or verbal cues Chair/bed transfer assistive device: Walker, Orthosis (B AFOs)     Locomotion Ambulation     Max distance: 75 Assist level: Supervision or verbal cues   Wheelchair   Type: Manual Max wheelchair distance: 177ft  Assist Level: Supervision or verbal cues  Cognition Comprehension Comprehension assist level: Follows basic conversation/direction with no assist  Expression Expression assist level: Expresses basic needs/ideas: With extra time/assistive device  Social Interaction Social Interaction assist level: Interacts appropriately with others with medication or extra time (anti-anxiety, antidepressant).  Problem Solving Problem solving assist level: Solves basic problems with no assist  Memory Memory assist level: Recognizes or recalls 25 - 49% of the time/requires cueing 50 - 75% of the time   Medical Problem List and Plan: 1.  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 Weightbearing as tolerated. AFO braces.             -Cont CIR   -team conference today---plan for home tomorrow 2.  DVT Prophylaxis/Anticoagulation: right soleal thrombus  -lovenox 30mg  daily  -repeat dopplers pending 3. Pain Management: Hydrocodone as needed.  -kpad,ice 4. Mood: Provide emotional support 5. Neuropsych: This patient is capable of making decisions on her own behalf.  Appreciate Neuropsych eval 6. Skin/Wound Care: Routine skin checks 7. Fluids/Electrolytes/Nutrition: encourage PO, especially fluids.  -  supp shake between meals  -bun with  some improvement  -continue to push po.  8. Atrial fibrillation. Lanoxin 0.125 mg daily. Cardiac rate control. Continue aspirin therapy 9. Constipation. Laxative assistance 10. Hypoalbuminemia:   supps as tolerated  11. Thrombocytopenia: Resolved 12. Severe osteoporosis: Monitor for further pathological fractures  13. Nausea--some improvement  -not using much hydrocodone  -likely pain component  -prn zofran   LOS (Days) 6 A FACE TO FACE EVALUATION WAS PERFORMED  Ranelle Oyster, MD 01/12/2017 8:45 AM

## 2017-01-12 NOTE — Discharge Summary (Signed)
[name], [name]             ACCOUNT NO.:  [id number]  MEDICAL RECORD NO.:  [id number]  LOCATION:                                 FACILITY:  PHYSICIAN:  Ranelle OysterZachary T. Swartz, M.D.DATE OF BIRTH:  05/15/1931  DATE OF ADMISSION:  01/06/2017 DATE OF DISCHARGE:  01/13/2017                              DISCHARGE SUMMARY   DISCHARGE DIAGNOSES: 1. Decreased functional mobility secondary to recent pelvic fracture     as well as history of myelopathy due to epidural hemorrhage. 2. Lovenox for deep vein thrombosis prophylaxis. 3. Isolated deep vein thrombosis, right soleal vein - Resolved. 4. Pain management. 5. Atrial fibrillation. 6. Constipation. 7. Decreased nutritional storage. 8. Thrombocytopenia. 9. Nausea, resolved.  HISTORY OF PRESENT ILLNESS:  This is an 83 year old right-handed female with history of atrial fibrillation, T9 paraplegia myelopathy, status post thoracic laminectomy for abscess off anticoagulation due to epidural hemorrhage received inpatient rehab services in December of 2015.  She was discharged to a skilled nursing facility, lumbar L2 compression fracture, again receiving inpatient rehab services July of 2017, was [facility].  Had since returned home, using a walker.  She lives alone.  She has a son in [place], neighbors with good support.  The patient with chronic bilateral foot drop, managed by AFO braces with gait disorder, had been receiving outpatient therapies.  Recent admission [facility] on December 26, 2016, after a fall due to questionable syncope, sustaining a pelvic ring fracture.  Evaluated by Orthopedic Services, nonsurgical, advised weightbearing as tolerated.  X-rays also showed some osteopenia, but a stable fracture.  Physical and occupational therapy ongoing.  The patient was admitted for a comprehensive rehab program.  PAST MEDICAL HISTORY:  See discharge diagnoses.  SOCIAL HISTORY:   Lives alone.  Good support of her neighbor.  She uses a walker and AFO braces.  Functional status upon admission to rehab services was minimal assist 10 feet rolling walker; moderate assist, sit to stand; mod-to-max assist, activities of daily living.  PHYSICAL EXAMINATION:  VITAL SIGNS:  Blood pressure 96/69, pulse 90 temperature 97, respirations 17. GENERAL:  This was an alert female and oriented x3. HEENT:  Pupils were round and reactive to light. CARDIAC:  Irregularly irregular. ABDOMEN:  Soft and nontender.  Good bowel sounds. LUNGS:  Clear to auscultation without wheeze. EXTREMITIES:  Ankle dorsi and plantar flexion 2/5 with contractures.  REHABILITATION HOSPITAL COURSE:  The patient was admitted to Inpatient Rehab Services with therapies initiated on a 3-hour daily basis, consisting of physical therapy, occupational therapy, and rehabilitation nursing.  The following issues were addressed during the patient's rehabilitation stay.  Pertaining to Ms. [name] CandleCummings' recent pelvic fracture, weightbearing as tolerated, neurovascular sensation intact. She would follow up with Orthopedic Service, Dr. Carola FrostHandy.  She was wearing bilateral AFO braces for history of myelopathy.  Initially, on aspirin for DVT prophylaxis.  Routine venous Doppler study showed an isolated DVT of right soleal vein placed on Lovenox therapy.  Follow up Doppler studies on January 12, 2017, showing no signs of DVT.  Pain Management use of hydrocodone as well as a heating pad with good results.  Cardiac rate remained controlled.  No  chest pain or shortness of breath.  She remained on low-dose aspirin.  Her appetite continued to improve.  She had some nausea, which was resolved as bowel program was regulated. Thrombocytopenia again resolved, latest platelet count 319,000.  The patient received weekly Collaborative Interdisciplinary Team conferences to discuss estimated length of stay, family teaching, any barriers to her  discharge.  Ongoing education for ambulation, ambulating with rolling walker with supervision, working with energy conservation techniques, wheelchair-to-bed with rolling walker with supervision, sit- to-supine with supervision, ambulating 50-feet with rolling walker. Again stressed the need to maintain the use of her walker and AFO braces.  She could gather her belongings for activities of daily living and homemaking.  Full teaching was completed and plan discharge to home with home health therapies.  DISCHARGE MEDICATIONS: 1. Aspirin 81 mg p.o. daily. 2. Calcitonin nasal spray daily. 3. Os-Cal 1 tablet daily. 4. Lanoxin 0.125 mg daily. 5. Xalatan ophthalmic solution 0.05% 1 drop both eyes at bedtime. 6. Magnesium oxide 400 mg p.o. daily. 7. MiraLAX daily. 8. Senokot-S 1 tablet twice daily, hold for loose stools. 9. Hydrocodone 2 tablets every 6 hours as needed pain.  DIET:  Regular.  SPECIAL INSTRUCTIONS:  Weightbearing as tolerated with bilateral AFO braces.  The patient would follow up with Dr. Faith Rogue at the outpatient rehab service office as directed; Dr. Myrene Galas, Orthopedic Services, call for appointment; Dr. Kevan Ny, Medical Management.     Mariam Dollar, P.A.   ______________________________ Ranelle Oyster, M.D.    DA/MEDQ  D:  01/12/2017  T:  01/12/2017  Job:  [id number]  cc:   Doralee Albino. Carola Frost, M.D. Pearla Dubonnet, M.D. Ranelle Oyster, M.D.

## 2017-01-12 NOTE — Progress Notes (Signed)
Physical Therapy Session Note  Patient Details  Name: [name] Weber MRN: [id number] Date of Birth: 03/22/1931  Today's Date: 01/12/2017 PT Individual Time: 1430-1500 PT Individual Time Calculation (min): 30 min   Short Term Goals: Week 1:  PT Short Term Goal 1 (Week 1): =LTG due to estimated LOS  Skilled Therapeutic Interventions/Progress Updates:   PT assisted with donning shoes and AFOs to prepare for therapy session for time management. Mod I for gait with RW into bathroom and transferred to toilet maintaining balance with grab bar and managing clothing. Gait down to and from therapy gym for functional mobility training. Neuro re-ed to address balance reactions, static and dynamic standing balance without UE support, and sit <> stands without use of UE's for support requiring overall min assist. Cues for upright posture to decreased posterior lean.    Therapy Documentation Precautions:  Precautions Precautions: Fall Required Braces or Orthoses: Other Brace/Splint Other Brace/Splint: Bil AFOs Restrictions Weight Bearing Restrictions: No RLE Weight Bearing: Weight bearing as tolerated LLE Weight Bearing: Weight bearing as tolerated  Pain: Pain Assessment Pain Score: Asleep Faces Pain Scale: Hurts little more Pain Type: Acute pain Pain Location: Hip Pain Orientation: Right Pain Descriptors / Indicators: Aching Pain Intervention(s): Medication (See eMAR)   See Function Navigator for Current Functional Status.   Therapy/Group: Individual Therapy  [name], Neelam Tiggs Darrol PokeBrescia  Bowie Delia B. Earl Losee, PT, DPT  01/12/2017, 3:23 PM

## 2017-01-12 NOTE — Progress Notes (Signed)
Initial Nutrition Assessment  DOCUMENTATION CODES:   Non-severe (moderate) malnutrition in context of chronic illness, Underweight  INTERVENTION:  Continue Ensure Enlive po BID, each supplement provides 350 kcal and 20 grams of protein  Encourage adequate PO intake.  NUTRITION DIAGNOSIS:   Malnutrition related to chronic illness as evidenced by severe depletion of muscle mass, moderate depletion of body fat.  GOAL:   Patient will meet greater than or equal to 90% of their needs  MONITOR:   PO intake, Supplement acceptance, Labs, Weight trends, Skin, I & O's  REASON FOR ASSESSMENT:   Consult Assessment of nutrition requirement/status  ASSESSMENT:   83 year old right-handed female with history of atrial fibrillation, T9 paraplegia/myelopathy status post thoracic laminectomy for abscess off anticoagulation due to epidural hemorrhage. Recent admission [facility] 12/26/2016 after a fall due to question syncope. Found to have pelvic ring fracture and was evaluated by orthopedic services Dr. Carola FrostHandy who advised nonsurgical management weightbearing as tolerated. Hip xray 1/31 reviewed, showing osteopenia and stable fracture.  She was denied by insurance for inpatient rehabilitation services and [facility] 12/30/[facility]. Expedited appeal by her insurance excepting patient to CIR  RD consulted for nutrition assessment via Victorino DikeJennifer, CSW. Meal completion has been varied from 0-90% with 90% at lunch today. Pt reports eating well at home with a protein shake at least 1-2 daily. Pt does report however not a big meat eater. Pt was educated on the importance of adequate caloric and especially protein intake. High sources of protein were discussed. Pt expressed understanding. Pt encouraged to eat her foods at meals and to drink her supplements. Plans for discharge tomorrow. Recommend continuation of nutritional supplements post discharge.    Nutrition-Focused physical exam completed. Findings are moderate fat depletion, severe muscle depletion, and no edema.   Labs and medications reviewed.   Diet Order:  Diet Heart Room service appropriate? Yes; Fluid consistency: Thin  Skin:  Reviewed, no issues  Last BM:  2/12  Height:   Ht Readings from Last 1 Encounters:  01/06/17 5\' 8"  (1.727 m)    Weight:   Wt Readings from Last 1 Encounters:  01/06/17 104 lb 11.2 oz (47.5 kg)    Ideal Body Weight:  63.6 kg  BMI:  Body mass index is 15.92 kg/m.  Estimated Nutritional Needs:   Kcal:  1500-1700  Protein:  65-75 grams  Fluid:  >/= 1.5 L/day  EDUCATION NEEDS:   Education needs addressed  Roslyn SmilingStephanie Amiliah Campisi, MS, RD, LDN Pager # [phone] After hours/ weekend pager # [phone]

## 2017-01-12 NOTE — Progress Notes (Signed)
Orthopedic Tech Progress Note Patient Details:  [name] Weber 01/07/1931 [id number] Called in advanced brace order; [facility] Patient ID: [name] Weber, female   DOB: 02/16/1931, 83 y.o.   MRN: [id number]   Nikki DomCrawford, Hillis Mcphatter 01/12/2017, 9:27 AM

## 2017-01-12 NOTE — Progress Notes (Signed)
Occupational Therapy Discharge Summary  Patient Details  Name: [name] Weber MRN: [id number] Date of Birth: [birth year]   Patient has met 7 of 8 long term goals due to improved activity tolerance, improved balance, postural control and improved coordination.  Patient to discharge at overall Modified Independent level with recommendations for intermittent supervision and assist for shower transfers and meal prep. Pt most limited by cognitive deficits and decreased short term memory and poor orientation. Pt reports friends/ caregivers will check in throughout the day and assist with meals. Plan is for them to hire morning and nighttime assist for pt.  Patient's care partner is independent to provide the necessary physical and cognitive assistance at discharge.    Reasons goals not met: Pt requires assist with donning B AFOs. She received new AFOs while on CIR and demonstrates increased difficulty donning these compared to previous version of AFOs. Pt to have paid hired assist at morning and evening who can assist with donning AFOs.   Recommendation:  Patient will benefit from ongoing skilled OT services in home health setting to continue to advance functional skills in the area of BADL, iADL and Reduce care partner burden.  Equipment: BSC  Reasons for discharge: treatment goals met and discharge from hospital  Patient/family agrees with progress made and goals achieved: Yes  OT Discharge Precautions/Restrictions  Precautions Precautions: Fall Required Braces or Orthoses: Other Brace/Splint Other Brace/Splint: Bil AFOs Restrictions Weight Bearing Restrictions: No RLE Weight Bearing: Weight bearing as tolerated LLE Weight Bearing: Weight bearing as tolerated Vision/Perception  Vision- History Baseline Vision/History: Wears glasses Wears Glasses: Reading only Patient Visual Report: No change from baseline Vision- Assessment Vision Assessment?: No apparent visual deficits   Cognition Overall Cognitive Status: History of cognitive impairments - at baseline Arousal/Alertness: Awake/alert Orientation Level: Oriented X4 Memory: Impaired Memory Impairment: Decreased recall of new information;Decreased short term memory;Decreased long term memory Decreased Long Term Memory: Verbal basic;Functional basic Decreased Short Term Memory: Verbal basic;Functional basic Problem Solving: Impaired Problem Solving Impairment: Verbal complex;Functional complex Executive Function: Decision Making Decision Making: Impaired Decision Making Impairment: Functional complex;Verbal complex Safety/Judgment: Appears intact Comments: Decreased STM affecting her abiliity to follow through on safety recommendations Sensation Sensation Light Touch: Appears Intact Coordination Gross Motor Movements are Fluid and Coordinated: No Fine Motor Movements are Fluid and Coordinated: Yes Coordination and Movement Description: Hx of incomplete paraperesis; Impaired due to R hip pain and generalized weakness Motor  Motor Motor: Other (comment) Motor - Skilled Clinical Observations: Hx of incomplete paraperesis Trunk/Postural Assessment  Cervical Assessment Cervical Assessment: Within Functional Limits Thoracic Assessment Thoracic Assessment: Exceptions to [facility] (Kyphotic) Lumbar Assessment Lumbar Assessment: Exceptions to [facility] (Posterior pelvic tilt) Postural Control Postural Control: Within Functional Limits  Balance Balance Balance Assessed: Yes Dynamic Sitting Balance Dynamic Sitting - Balance Support: Feet supported;During functional activity Dynamic Sitting - Level of Assistance: 6: Modified independent (Device/Increase time) Static Standing Balance Static Standing - Balance Support: During functional activity;Right upper extremity supported;Left upper extremity supported Static Standing - Level of Assistance: 6: Modified independent (Device/Increase time) Dynamic Standing  Balance Dynamic Standing - Balance Support: During functional activity;Right upper extremity supported;Left upper extremity supported Dynamic Standing - Level of Assistance: 6: Modified independent (Device/Increase time) Dynamic Standing - Comments: Standing to complete LB clothing management. Extremity/Trunk Assessment RUE Assessment RUE Assessment: Within Functional Limits LUE Assessment LUE Assessment: Within Functional Limits   See Function Navigator for Current Functional Status.  Lewis, Dorthey Depace C 01/12/2017, 9:10 AM

## 2017-01-12 NOTE — Plan of Care (Signed)
Problem: RH Dressing Goal: LTG Patient will perform lower body dressing w/assist (OT) LTG: Patient will perform lower body dressing with assist, with/without cues in positioning using equipment (OT)  Outcome: Not Met (add Reason) Pt requires assistance to don new AFOs as part of LB dressing. AL 2/13

## 2017-01-12 NOTE — Progress Notes (Signed)
*  PRELIMINARY RESULTS* Vascular Ultrasound Right lower extremity venous duplex has been completed.  Preliminary findings: No evidence of deep vein thrombosis in the right lower extremity.  No evidence of prior soleal vein thrombosis on today's exam.  Small simple cyst noted in right groin.   Chauncey FischerCharlotte C Kyran Whittier 01/12/2017, 12:41 PM

## 2017-01-12 NOTE — Progress Notes (Signed)
Physical Therapy Session Note  Patient Details  Name: [name] Weber MRN: [id number] Date of Birth: [birth year]  Today's Date: 01/12/2017 PT Individual Time: 1050-1135 PT Individual Time Calculation (min): 45 min   Short Term Goals: Week 1:  PT Short Term Goal 1 (Week 1): =LTG due to estimated LOS  Skilled Therapeutic Interventions/Progress Updates: Pt presented in w/c agreeable to therapy. Performed bed mobility at exercise mat. Pt able to perform transfer and bed mobility with min cues and no significant increase in pain. Pt performed supine therex including heel slides, AASLR, hip abd/add, SAQ, bridges x 10 bilaterally with min cues. Pt ambulated back with room with RW with no rest breaks and demonstrating good safety. Pt request to use toilet and performed toilet transfer with mod I. Pt left in w/c with call bell within reach and all needs met.      Therapy Documentation Precautions:  Precautions Precautions: Fall Required Braces or Orthoses: Other Brace/Splint Other Brace/Splint: Bil AFOs Restrictions Weight Bearing Restrictions: No RLE Weight Bearing: Weight bearing as tolerated LLE Weight Bearing: Weight bearing as tolerated General:   Vital Signs: Therapy Vitals Temp: 98.1 F (36.7 C) Temp Source: Oral Pulse Rate: 76 Resp: 18 BP: 109/61 Patient Position (if appropriate): Lying Oxygen Therapy SpO2: 99 % O2 Device: Not Delivered Pain: Pain Assessment Pain Score: Asleep Faces Pain Scale: Hurts little more Pain Type: Acute pain Pain Location: Hip Pain Orientation: Right Pain Descriptors / Indicators: Aching Pain Intervention(s): Medication (See eMAR)   See Function Navigator for Current Functional Status.   Therapy/Group: Individual Therapy  Sayed Apostol  Gowri Suchan, PTA  01/12/2017, 3:50 PM

## 2017-01-12 NOTE — Progress Notes (Signed)
Physical Therapy Discharge Summary  Patient Details  Name: [name] Weber MRN: [id number] Date of Birth: May 03, 1931  Today's Date: 01/12/2017 PT Individual Time: 0900-1015 PT Individual Time Calculation (min): 75 min    Patient has met 9 of 9 long term goals due to improved activity tolerance, improved balance, improved postural control, increased strength, increased range of motion and decreased pain.  Patient to discharge at an ambulatory level Modified Independent with intermittent S. Patient's care partner is independent to provide the necessary physical and cognitive assistance at discharge.  Reasons goals not met: All goals met  Recommendation:  Patient will benefit from ongoing skilled PT services in home health setting to continue to advance safe functional mobility, address ongoing impairments in strength, coordination, balance, activity tolerance, and minimize fall risk.  Equipment: No equipment provided  Reasons for discharge: treatment goals met and discharge from hospital  Patient/family agrees with progress made and goals achieved: Yes  PT Discharge Precautions/Restrictions Precautions Precautions: Fall Required Braces or Orthoses: Other Brace/Splint Other Brace/Splint: Bil AFOs Restrictions Weight Bearing Restrictions: No RLE Weight Bearing: Weight bearing as tolerated LLE Weight Bearing: Weight bearing as tolerated Pain Pain Assessment Pain Assessment: Faces Faces Pain Scale: Hurts a little bit Pain Type: Acute pain Pain Location: Hip Pain Orientation: Right Pain Intervention(s): Medication (See eMAR);Heat applied Vision/Perception    WFL, wears reading glasses Cognition Overall Cognitive Status: History of cognitive impairments - at baseline Arousal/Alertness: Awake/alert Orientation Level: Oriented X4 Memory: Impaired Memory Impairment: Decreased recall of new information;Decreased short term memory;Decreased long term memory Decreased Long Term  Memory: Verbal basic;Functional basic Decreased Short Term Memory: Verbal basic;Functional basic Problem Solving: Impaired Problem Solving Impairment: Verbal complex;Functional complex Executive Function: Decision Making Decision Making: Impaired Decision Making Impairment: Functional complex;Verbal complex Safety/Judgment: Appears intact Comments: Decreased STM affecting her abiliity to follow through on safety recommendations Sensation Sensation Light Touch: Appears Intact Coordination Gross Motor Movements are Fluid and Coordinated: No Fine Motor Movements are Fluid and Coordinated: Yes Coordination and Movement Description: Hx of incomplete paraperesis; Impaired due to R hip pain and generalized weakness Motor  Motor Motor: Other (comment) Motor - Skilled Clinical Observations: Hx of incomplete paraperesis  Mobility Bed Mobility Bed Mobility: Supine to Sit;Sit to Supine Supine to Sit: 6: Modified independent (Device/Increase time);HOB flat Sit to Supine: 6: Modified independent (Device/Increase time);HOB flat Transfers Transfers: Yes Stand Pivot Transfers: 6: Modified independent (Device/Increase time) Locomotion  Ambulation Ambulation: Yes Ambulation/Gait Assistance: 6: Modified independent (Device/Increase time) Ambulation Distance (Feet): 200 Feet Assistive device: Rolling walker;Other (Comment) (BLE AFOs) Gait Gait: Yes Gait Pattern: Impaired Gait Pattern: Step-through pattern;Decreased stride length;Poor foot clearance - right;Poor foot clearance - left;Trunk flexed;Lateral hip instability;Trendelenburg Gait velocity: 1.57 ft/sec Stairs / Additional Locomotion Stairs: Yes Stairs Assistance: 5: Supervision Stairs Assistance Details: Verbal cues for gait pattern;Verbal cues for precautions/safety Stair Management Technique: Two rails;Step to pattern;Forwards Number of Stairs: 12 Height of Stairs: 3 Ramp: 5: Supervision Curb: 5: Agricultural engineer: Yes Wheelchair Assistance: 6: Modified independent (Device/Increase time) Environmental health practitioner: Both upper extremities Wheelchair Parts Management: Independent Distance: >200'  Trunk/Postural Assessment  Cervical Assessment Cervical Assessment: Within Functional Limits Thoracic Assessment Thoracic Assessment: Exceptions to [facility] (Kyphotic) Lumbar Assessment Lumbar Assessment: Exceptions to [facility] (Posterior pelvic tilt) Postural Control Postural Control: Deficits on evaluation (decreased stepping, righting, and ankle strategies d/t AFOs, LE strength/coordination deficits)  Balance Balance Balance Assessed: Yes Standardized Balance Assessment Standardized Balance Assessment: Timed Up and Go Test;Berg Balance Test Berg Balance Test Sit  to Stand: Able to stand  independently using hands Standing Unsupported: Unable to stand 30 seconds unassisted Sitting with Back Unsupported but Feet Supported on Floor or Stool: Able to sit safely and securely 2 minutes Stand to Sit: Controls descent by using hands Transfers: Able to transfer safely, definite need of hands Standing Unsupported with Eyes Closed: Needs help to keep from falling Standing Ubsupported with Feet Together: Needs help to attain position and unable to hold for 15 seconds From Standing, Reach Forward with Outstretched Arm: Loses balance while trying/requires external support From Standing Position, Pick up Object from Floor: Unable to try/needs assist to keep balance From Standing Position, Turn to Look Behind Over each Shoulder: Needs assist to keep from losing balance and falling Turn 360 Degrees: Needs assistance while turning Standing Unsupported, Alternately Place Feet on Step/Stool: Needs assistance to keep from falling or unable to try Standing Unsupported, One Foot in Front: Needs help to step but can hold 15 seconds Standing on One Leg: Unable to try or needs assist to prevent fall Total  Score: 14 Timed Up and Go Test TUG: Normal TUG Normal TUG (seconds): 26 Dynamic Sitting Balance Dynamic Sitting - Balance Support: Feet supported;During functional activity Dynamic Sitting - Level of Assistance: 6: Modified independent (Device/Increase time) Static Standing Balance Static Standing - Balance Support: During functional activity;Right upper extremity supported;Left upper extremity supported Static Standing - Level of Assistance: 6: Modified independent (Device/Increase time) Dynamic Standing Balance Dynamic Standing - Balance Support: During functional activity;Right upper extremity supported;Left upper extremity supported Dynamic Standing - Level of Assistance: 6: Modified independent (Device/Increase time) Dynamic Standing - Comments: standing to pull up pants after toileting Extremity Assessment  RUE Assessment RUE Assessment: Within Functional Limits LUE Assessment LUE Assessment: Within Functional Limits RLE Assessment RLE Assessment: Exceptions to [facility] (Hip flexion 2/5, knee extension 4+/5, knee flexion 4+/5, ankle dorsiflexion 2/5, ankle plantarflexion 3/5) LLE Assessment LLE Assessment: Exceptions to [facility] (hip/knee 4+/5 throughout, ankle dorsiflexion 2/5, ankle plantarflexion 3/5)  Skilled Therapeutic Intervention: Pt received in restroom with handoff from NT. Performed clothing management and transfer to w/c with modI and increased time, use of grab bars. Seated in w/c pt performs hand and oral hygiene at sink modI from w/c level. W/c propulsion modI to gym. Ascent/descent twelve 3" and 6" stairs with step-to pattern, B handrails and S. Gait x200' with RW and modI, BLE AFOs. Performed remainder of mobility as described above with modI overall. Orthotist present for assessment/fit of PLS AFOs; reports discomfort likely due to type of brace interfacing with high arch support on pt's shoes. Orthotist will return tomorrow with lateral support AFOs for pt to take home.  Assessed TUG and Berg as above with scores indicating high fall risk; educated pt on risk for falls and recommendation to continue using RW for safety at home. Pt with no further questions/concerns regarding d/c home at this time. Remained seated in w/c at end of session, all needs in reach.   See Function Navigator for Current Functional Status.  Benjiman Core Tygielski 01/12/2017, 10:07 AM

## 2017-01-12 NOTE — Progress Notes (Signed)
Occupational Therapy Session Note  Patient Details  Name: [name] Weber MRN: [id number] Date of Birth: 06/21/1931  Today's Date: 01/12/2017 OT Individual Time: 0700-0800 OT Individual Time Calculation (min): 60 min    Short Term Goals: Week 1:  OT Short Term Goal 1 (Week 1): STG= LTG due to LOS  Skilled Therapeutic Interventions/Progress Updates:    Pt seen for OT ADL bathing/dressing session. Pt in supine upon arrival, easily awoken and min VCs for orientation. Pt agreeable to tx session and bathing at shower level. She ambulated throughout session with RW and supervision, bathing from seated position on tub bench. She gathered clothing items from w/c level and dressed seated in w/c sit <> stand from sink ledge to complete clothing management.  She required assist for donning new AFOs to ensure proper fitting of shoe. She ambulated throughout unit with supervision, standing rest breaks required throughout ambulation. She returned to room at end of session, left seated in w/c set-up with K-pad for comfort and all needs in reach. Encouraged pt to eat breakfast for sustained energy throughout tx sessions.  Reviewed with pt d/c recommendations for safe d/c home wit intermittent supervision. Discussed HHOT vs OPOT for follow up.    Therapy Documentation Precautions:  Precautions Precautions: Fall Required Braces or Orthoses: Other Brace/Splint Other Brace/Splint: Bil AFOs Restrictions Weight Bearing Restrictions: No RLE Weight Bearing: Weight bearing as tolerated LLE Weight Bearing: Weight bearing as tolerated Pain: Pain Assessment Pain Assessment: Faces Faces Pain Scale: Hurts whole lot Pain Type: Acute pain Pain Location: Hip Pain Orientation: Right Pain Intervention(s): Medication (See eMAR);Heat applied, RN aware, repositioned  See Function Navigator for Current Functional Status.   Therapy/Group: Individual Therapy  Lewis, Lymon Kidney C 01/12/2017, 6:40 AM

## 2017-01-12 NOTE — Discharge Summary (Signed)
Discharge summary job # [id number]

## 2017-01-13 ENCOUNTER — Encounter: Payer: Medicare Other | Admitting: Physical Medicine & Rehabilitation

## 2017-01-13 MED ORDER — MAGNESIUM 400 MG PO CAPS
400.0000 mg | ORAL_CAPSULE | Freq: Every day | ORAL | 0 refills | Status: DC
Start: 2017-01-13 — End: 2018-11-05

## 2017-01-13 MED ORDER — SENNOSIDES-DOCUSATE SODIUM 8.6-50 MG PO TABS: 1.0000 | ORAL_TABLET | Freq: Two times a day (BID) | ORAL | Status: DC

## 2017-01-13 MED ORDER — HYDROCODONE-ACETAMINOPHEN 5-325 MG PO TABS: 1.0000 | ORAL_TABLET | Freq: Four times a day (QID) | ORAL | 0 refills | Status: DC | PRN

## 2017-01-13 MED ORDER — DIGOXIN 125 MCG PO TABS: 125.0000 ug | ORAL_TABLET | Freq: Every day | ORAL | 3 refills | Status: DC

## 2017-01-13 MED ORDER — ALUM & MAG HYDROXIDE-SIMETH 200-200-20 MG/5ML PO SUSP
30.0000 mL | Freq: Once | ORAL | Status: AC
  Administered 2017-01-13: 30 mL via ORAL
  Filled 2017-01-13: qty 30

## 2017-01-13 MED ORDER — FISH OIL ULTRA 1400 MG PO CAPS: 1400.0000 mg | ORAL_CAPSULE | Freq: Every day | ORAL | 0 refills | Status: DC

## 2017-01-13 MED ORDER — CALCITONIN (SALMON) 200 UNIT/ACT NA SOLN: 1.0000 | Freq: Every day | NASAL | 12 refills | Status: DC

## 2017-01-13 NOTE — Discharge Instructions (Signed)
Inpatient Rehab Discharge Instructions  [name] Weber Discharge date and time: No discharge date for patient encounter.   Activities/Precautions/ Functional Status: Activity: as tolerated/foot braces Diet: regular diet Wound Care: none needed Functional status:  ___ No restrictions     ___ Walk up steps independently ___ 24/7 supervision/assistance   ___ Walk up steps with assistance ___ Intermittent supervision/assistance  ___ Bathe/dress independently ___ Walk with walker     _x__ Bathe/dress with assistance ___ Walk Independently    ___ Shower independently ___ Walk with assistance    ___ Shower with assistance ___ No alcohol     ___ Return to work/school ________  COMMUNITY REFERRALS UPON DISCHARGE:   Home Health:   PT     OT      SNA - for bathing    Agency:  [facility] Phone:  [phone] Medical Equipment/Items Ordered:  Bedside commode  Agency/Supplier:  [facility]       Phone:  [phone]  Special Instructions:    My questions have been answered and I understand these instructions. I will adhere to these goals and the provided educational materials after my discharge from the hospital.  Patient/Caregiver Signature _______________________________ Date __________  Clinician Signature _______________________________________ Date __________  Please bring this form and your medication list with you to all your follow-up doctor's appointments.

## 2017-01-13 NOTE — Progress Notes (Signed)
Social Work Discharge Note  The overall goal for the admission was met for:   Discharge location: Yes - home with friend/caregiver support  Length of Stay: Yes - 7 days  Discharge activity level: Yes - intermittent supervision  Home/community participation: Yes  Services provided included: MD, RD, PT, OT, RN, Pharmacy and SW  Financial Services: [place] [employer]  Follow-up services arranged: Home Health: PT/OT/CNA, DME: bedside commode and Patient/Family request agency HH: [facility], DME: [facility]  Comments (or additional information): Pt will go home with [employer], paid caregivers in the morning and evening, and support from her friends/POA.  She will also have [facility] and then transition to outpt rehab, when appropriate.  Patient/Family verbalized understanding of follow-up arrangements: Yes  Individual responsible for coordination of the follow-up plan: pt with assistance from her POA and paid caregivers  Confirmed correct DME delivered: Trey Sailors 01/13/2017    Shaunika Italiano, Silvestre Mesi

## 2017-01-13 NOTE — Progress Notes (Signed)
Patient discussed discharged instructions with Dan,A,PA,before she went home.

## 2017-01-13 NOTE — Plan of Care (Signed)
Problem: RH PAIN MANAGEMENT Goal: RH STG PAIN MANAGED AT OR BELOW PT'S PAIN GOAL <3  Outcome: Progressing No pain at this time.

## 2017-01-13 NOTE — Progress Notes (Signed)
PHYSICAL MEDICINE & REHABILITATION     PROGRESS NOTE    Subjective/Complaints: Having low back pain still. A little better. Feels ready to go home today  ROS: pt denies nausea, vomiting, diarrhea, cough, shortness of breath or chest pain    Objective: Vital Signs: Blood pressure (!) 124/57, pulse 73, temperature 98.5 F (36.9 C), temperature source Oral, resp. rate 18, height 5\' 8"  (1.727 m), weight 47.5 kg (104 lb 11.2 oz), SpO2 98 %. No results found.  Recent Labs  01/11/17 0441  WBC 10.0  HGB 12.0  HCT 36.9  PLT 319    Recent Labs  01/11/17 0441  NA 136  K 4.0  CL 100*  GLUCOSE 97  BUN 16  CREATININE 0.50  CALCIUM 8.7*   CBG (last 3)  No results for input(s): GLUCAP in the last 72 hours.  Wt Readings from Last 3 Encounters:  01/06/17 47.5 kg (104 lb 11.2 oz)  01/06/17 54.9 kg (121 lb 0.5 oz)  12/28/16 54.9 kg (121 lb 0.5 oz)    Physical Exam:  Constitutional: Frail appearing. No distress.  HENT: Normocephalic and atraumatic.  Cardiovascular: IRR IRR Respiratory: cta GI: Soft. Bowel sounds are normal. Some pain in hypogastric region with palpation. Non-distended Musculoskeletal: She exhibits tenderness in the low back/pelvis. She exhibits no edema.  Bilateral heel cords tight Pain with range of motion right greater than left hip. Neurological: She is alert and oriented.  Follows commands Sensation intact to light touch except for distal LE's Speech clear.  Motor: B/l UE 5/5 proximal to distal RLE: HF 2/5, KE 4/5, ADF/PF 2/5 with contracture --stable LLE: HF 3+/5, KE 4+/5, ADF/PF 2/5 with contracture--stable Skin: Skin is warm and dry.  Psychiatric: She has a normal mood and affect. Her behavior is normal. Thought content normal.    Assessment/Plan: 1. Functional and mobility deficits secondary to pelvic fractures after fall which require 3+ hours per day of interdisciplinary therapy in a comprehensive inpatient rehab  setting. Physiatrist is providing close team supervision and 24 hour management of active medical problems listed below. Physiatrist and rehab team continue to assess barriers to discharge/monitor patient progress toward functional and medical goals.  Function:  Bathing Bathing position   Position: Shower  Bathing parts Body parts bathed by patient: Right arm, Left arm, Abdomen, Chest, Front perineal area, Buttocks, Right upper leg, Left upper leg, Right lower leg, Left lower leg, Back Body parts bathed by helper: Back  Bathing assist Assist Level: More than reasonable time      Upper Body Dressing/Undressing Upper body dressing   What is the patient wearing?: Pull over shirt/dress     Pull over shirt/dress - Perfomed by patient: Thread/unthread right sleeve, Thread/unthread left sleeve, Put head through opening, Pull shirt over trunk          Upper body assist Assist Level: More than reasonable time   Set up : To obtain clothing/put away  Lower Body Dressing/Undressing Lower body dressing   What is the patient wearing?: Underwear, Pants, Socks, Shoes, AFO Underwear - Performed by patient: Thread/unthread right underwear leg, Thread/unthread left underwear leg, Pull underwear up/down   Pants- Performed by patient: Thread/unthread right pants leg, Thread/unthread left pants leg, Pull pants up/down   Non-skid slipper socks- Performed by patient: Don/doff right sock, Don/doff left sock   Socks - Performed by patient: Don/doff right sock, Don/doff left sock   Shoes - Performed by patient: Don/doff right shoe, Don/doff left shoe   AFO -  Performed by patient: Don/doff right AFO, Don/doff left AFO        Lower body assist Assist for lower body dressing: More than reasonable time   Set up : To obtain clothing/put away  Toileting Toileting   Toileting steps completed by patient: Adjust clothing prior to toileting, Performs perineal hygiene, Adjust clothing after  toileting Toileting steps completed by helper: Adjust clothing prior to toileting, Adjust clothing after toileting Toileting Assistive Devices: Grab bar or rail  Toileting assist Assist level: More than reasonable time   Transfers Chair/bed transfer   Chair/bed transfer method: Ambulatory Chair/bed transfer assist level: No Help, no cues, assistive device, takes more than a reasonable amount of time Chair/bed transfer assistive device: Walker, Orthosis, Armrests     Locomotion Ambulation     Max distance: 200 Assist level: No help, No cues, assistive device, takes more than a reasonable amount of time   Wheelchair   Type: Manual Max wheelchair distance: 175 Assist Level: No help, No cues, assistive device, takes more than reasonable amount of time  Cognition Comprehension Comprehension assist level: Follows complex conversation/direction with extra time/assistive device  Expression Expression assist level: Expresses complex ideas: With extra time/assistive device  Social Interaction Social Interaction assist level: Interacts appropriately with others - No medications needed.  Problem Solving Problem solving assist level: Solves complex 90% of the time/cues < 10% of the time  Memory Memory assist level: Recognizes or recalls 90% of the time/requires cueing < 10% of the time   Medical Problem List and Plan: 1.  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 Weightbearing as tolerated. AFO braces.             -Cont CIR   -home today  -can see me at office in a month 2.  DVT Prophylaxis/Anticoagulation: right soleal thrombus  -lovenox 30mg  daily--encourage leg movement/mobility  -repeat dopplers negative 3. Pain Management: Hydrocodone as needed--encouraged the use of tylenol.  -kpad,ice 4. Mood: Provide emotional support 5. Neuropsych: This patient is capable of making  decisions on her own behalf.  Appreciate Neuropsych eval 6. Skin/Wound Care: Routine skin checks 7. Fluids/Electrolytes/Nutrition: encourage PO, especially fluids.  -  supp shake between meals  RD consult 8. Atrial fibrillation. Lanoxin 0.125 mg daily. Cardiac rate control. Continue aspirin therapy 9. Constipation. Laxative assistance 10. Hypoalbuminemia:   supps as tolerated  11. Thrombocytopenia: Resolved 12. Severe osteoporosis: Monitor for further pathological fractures  13. Nausea--generally improved  LOS (Days) 7 A FACE TO FACE EVALUATION WAS PERFORMED  Ranelle OysterSWARTZ,ZACHARY T, MD 01/13/2017 8:58 AM

## 2017-01-13 NOTE — Progress Notes (Signed)
Social Work Patient ID: [name] Weber, female   DOB: 08/25/1931, 83 y.o.   MRN: [id number]   CSW met with pt and spoke with her POA via telephone to update them on team conference discussion.  Pt and POA, [place], feel pt is ready to come home and they are prepared at home - [employer] ordered and on the way, caregivers in place for morning and evening, food in the home, etc.  CSW has arranged [facility] for pt for PT/OT/CNA for bathing assistance.  Pt has received [facility] for home use, as well.  CSW remains available to assist as needed.

## 2017-01-13 NOTE — Patient Care Conference (Signed)
Inpatient RehabilitationTeam Conference and Plan of Care Update Date: 01/12/2017   Time: 2:00 PM    Patient Name: [name] Weber      Medical Record Number: [id number]  Date of Birth: [birth year] Sex: Female         Room/Bed: 4M08C/4M08C-01 Payor Info: Payor: [employer]vertising copywriter MEDICARE / Plan: [employer] MEDICARE / Product Type: *No Product type* /    Admitting Diagnosis: Pelvic fXS  Admit Date/Time:  01/06/2017  1:54 PM Admission Comments: No comment available   Primary Diagnosis:  Multiple closed fractures of pelvis with stable disruption of pelvic circle (HCC) Principal Problem: Multiple closed fractures of pelvis with stable disruption of pelvic circle [facility])  Patient Active Problem List   Diagnosis Date Noted  . Other osteoporosis with current pathological fracture   . Chronic midline low back pain without sciatica   . Thrombus   . Closed nondisplaced fracture of pelvis with routine healing   . Pelvic ring fracture (HCC)   . Osteoporosis with current pathological fracture   . Slow transit constipation   . Fractures   . Pain   . Generalized anxiety disorder   . Benign essential HTN   . Hypoalbuminemia due to protein-calorie malnutrition (HCC)   . Thrombocytopenia (HCC)   . Pulmonary hypertension   . Age-related osteoporosis with current pathological fracture   . Pelvic fracture (HCC) 12/26/2016  . Multiple closed fractures of pelvis with stable disruption of pelvic circle (HCC)   . Memory loss   . Lumbar burst fracture (HCC) 06/08/2016  . Lumbar compression fracture (HCC) 06/08/2016  . Bilateral foot-drop   . Midline thoracic back pain   . A-fib (HCC)   . Depression   . Constipation due to pain medication   . Chronic constipation   . Compression fracture of L2 (HCC) 05/30/2016  . Elevated blood pressure 05/30/2016  . L2 vertebral fracture (HCC) 05/30/2016  . Anxiety state 12/10/2014  . Neurogenic bladder 11/30/2014  . Neurogenic bowel 11/30/2014  . Bacterial UTI  11/30/2014  . Thoracic myelopathy 11/29/2014  . Persistent atrial fibrillation (HCC) 11/27/2014  . Traumatic spinal subdural hematoma   . Acute pulmonary embolism (HCC)   . Orthostatic hypotension 11/22/2014  . Near syncope 11/21/2014  . Paraplegia at T9 level (HCC) 11/17/2014  . Weakness of both legs   . Nocturnal leg cramps 07/16/2014  . Encounter for therapeutic drug monitoring 12/25/2013  . Malaise and fatigue 06/01/2011  . Atrial flutter (HCC) 03/10/2011  . Long term (current) use of anticoagulants 03/10/2011  . Benign hypertensive heart disease without heart failure 03/10/2011  . HLD (hyperlipidemia) 03/10/2011  . Paroxysmal atrial flutter (HCC)   . Palpitations   . PAC (premature atrial contraction)   . PVC's (premature ventricular contractions)   . Malaise   . Fatigue   . Myalgia     Expected Discharge Date: Expected Discharge Date: 01/13/17  Team Members Present: Physician leading conference: Dr. Faith Rogue Social Worker Present: Staci Acosta, LCSW Nurse Present: Ronny Bacon, RN PT Present: Alyson Reedy, PT OT Present: Johnsie Cancel, OT PPS Coordinator present : Tora Duck, RN, CRRN     Current Status/Progress Goal Weekly Team Focus  Medical   right pelvic fx's after fall. continued pain but showing improvement. distal right DVT not present on follow up film  improve balance  finalize medical issues for dc home   Bowel/Bladder   continent of bowel and bladder; LBM 01/10/17 with multiple soft stools  pt will have fewer BMs in a  24 hour period  monitor for continued multiple stools; PRN immodium   Swallow/Nutrition/ Hydration             ADL's   Supervision- mod I overall  Mod I with supervision for shower transfers and simple meal prep   d/c planning; planned d/c home tomorrow with intermittent supervision   Mobility   modI bed mobility, transfers, gait in household, S stairs and gait in community  modI bed mobility, transfers, gait in home, S  stairs and community mobility  activity tolerance, balance, LE strength, pain management, d/c planning   Communication             Safety/Cognition/ Behavioral Observations            Pain   pain to R hip; tylenol 650mg  Q4 PRN  <3  assess for pain and medicated as needed   Skin   scattered bruising to arms and hands  pt will be free of skin breakdown  assess for changes in skin integrity    Rehab Goals Patient on target to meet rehab goals: Yes Rehab Goals Revised: none *See Care Plan and progress notes for long and short-term goals.  Barriers to Discharge: prior neuro deficits, decreased safety awareness    Possible Resolutions to Barriers:  continued safety ed. increased support at home. ?long term living plan. ??ALF    Discharge Planning/Teaching Needs:  Pt plans to return to her home with intermittent supervision, paid caregivers a few times a day, and a Lifeline/Life Alert button.  Pt will have f/u therapies and bedside commode for home use.  Pt's friends/POA have been here with pt and know her needs.   Team Discussion:  Pt with 3rd CIR admission after broken pelvis, but doing well.  She has awareness/safety issues and is not able to see her challenges.  Pt is on track for 01-13-17 d/c.  Family requested nutrition consult and CSW called for dietician to come see pt.    Revisions to Treatment Plan:  none   Continued Need for Acute Rehabilitation Level of Care: The patient requires daily medical management by a physician with specialized training in physical medicine and rehabilitation for the following conditions: Daily direction of a multidisciplinary physical rehabilitation program to ensure safe treatment while eliciting the highest outcome that is of practical value to the patient.: Yes Daily medical management of patient stability for increased activity during participation in an intensive rehabilitation regime.: Yes Daily analysis of laboratory values and/or radiology  reports with any subsequent need for medication adjustment of medical intervention for : Neurological problems;Other  Samyak Sackmann, Vista DeckJennifer Capps 01/13/2017, 1:52 PM

## 2017-01-14 ENCOUNTER — Telehealth: Payer: Self-pay

## 2017-01-14 NOTE — Telephone Encounter (Signed)
PT called asking for verbal orders for this patient for PT for 3xwk for 3wks then 2xwk for 2wk, verbal orders given

## 2017-01-15 ENCOUNTER — Other Ambulatory Visit: Payer: Self-pay | Admitting: Internal Medicine

## 2017-01-15 DIAGNOSIS — Z1231 Encounter for screening mammogram for malignant neoplasm of breast: Secondary | ICD-10-CM

## 2017-02-05 ENCOUNTER — Ambulatory Visit
Admission: RE | Admit: 2017-02-05 | Discharge: 2017-02-05 | Disposition: A | Discharge status: Home / Self Care | Payer: Medicare Other | Source: Ambulatory Visit | Attending: Internal Medicine | Admitting: Internal Medicine

## 2017-02-05 DIAGNOSIS — Z1231 Encounter for screening mammogram for malignant neoplasm of breast: Secondary | ICD-10-CM

## 2017-02-10 ENCOUNTER — Encounter: Payer: Self-pay | Admitting: Physical Medicine & Rehabilitation

## 2017-02-10 ENCOUNTER — Encounter: Payer: Medicare Other | Attending: Physical Medicine & Rehabilitation | Admitting: Physical Medicine & Rehabilitation

## 2017-02-10 VITALS — BP 107/68 | HR 76 | Resp 14

## 2017-02-10 DIAGNOSIS — M4714 Other spondylosis with myelopathy, thoracic region: Secondary | ICD-10-CM | POA: Diagnosis present

## 2017-02-10 DIAGNOSIS — I493 Ventricular premature depolarization: Secondary | ICD-10-CM | POA: Insufficient documentation

## 2017-02-10 DIAGNOSIS — Z87891 Personal history of nicotine dependence: Secondary | ICD-10-CM | POA: Insufficient documentation

## 2017-02-10 DIAGNOSIS — I480 Paroxysmal atrial fibrillation: Secondary | ICD-10-CM | POA: Diagnosis not present

## 2017-02-10 DIAGNOSIS — Z9889 Other specified postprocedural states: Secondary | ICD-10-CM | POA: Diagnosis not present

## 2017-02-10 DIAGNOSIS — S32029S Unspecified fracture of second lumbar vertebra, sequela: Secondary | ICD-10-CM

## 2017-02-10 DIAGNOSIS — S32810S Multiple fractures of pelvis with stable disruption of pelvic ring, sequela: Secondary | ICD-10-CM | POA: Insufficient documentation

## 2017-02-10 DIAGNOSIS — I491 Atrial premature depolarization: Secondary | ICD-10-CM | POA: Insufficient documentation

## 2017-02-10 DIAGNOSIS — M2012 Hallux valgus (acquired), left foot: Secondary | ICD-10-CM | POA: Insufficient documentation

## 2017-02-10 DIAGNOSIS — M791 Myalgia: Secondary | ICD-10-CM | POA: Diagnosis not present

## 2017-02-10 NOTE — Patient Instructions (Addendum)
YOU NEED TO PRACTICE GOOD SAFETY JUDGEMENT AND COMMON SENSE EVERY DAY   SLIDING FEET AT HOME IS OK AS LONG SURFACE IS SMOOTH AND REGULAR   FOR LONGER DISTANCES OUT OF THE HOME YOU SHOULD BE USING YOUR BRACES!!!!   LET ME KNOW WHEN HOME HEALTH THERAPY IS READY TO DISCHARGE YOU AND WE CAN REFER YOU TO OUTPATIENT THERAPY
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
## 2017-02-23 ENCOUNTER — Telehealth: Payer: Self-pay | Admitting: *Deleted

## 2017-02-23 NOTE — Telephone Encounter (Signed)
Via [name], patient's caregiver, left a message for Dr. [name]. They would like to get a referral to get patient back into [facility] outpatient neurorehab. Apparently this was discussed at last clinic visit.  They are hoping to have it start April 9th, 2018. Please call when completed or if we have any questions. ([place] [name] [phone])

## 2017-03-01 ENCOUNTER — Telehealth: Payer: Self-pay | Admitting: Physical Medicine & Rehabilitation

## 2017-03-01 DIAGNOSIS — G8220 Paraplegia, unspecified: Secondary | ICD-10-CM

## 2017-03-01 DIAGNOSIS — R29898 Other symptoms and signs involving the musculoskeletal system: Secondary | ICD-10-CM

## 2017-03-01 NOTE — Telephone Encounter (Signed)
Message left by Diane (sp?) Bolton calling for patient - states would like to change to OP PT at [facility] (Amy) thinks the inhome has reached the maximum benefit to patient - states her second call.

## 2017-03-02 NOTE — Telephone Encounter (Signed)
Order written for neuro rehab PT

## 2017-03-05 ENCOUNTER — Telehealth: Payer: Self-pay

## 2017-03-05 NOTE — Telephone Encounter (Signed)
12:39 PM  You routed this conversation to [facility]  Me      12:39 PM  Note    Order written for neuro rehab PT    March 01, 2017        7:55 AM  Jens Som routed this conversation to Me  Jens Som      7:54 AM  Note    Message left by Diane (sp?) Bolton calling for patient - states would like to change to OP PT at [facility] (Amy) thinks the inhome has reached the maximum benefit to patient - states her second call

## 2017-03-05 NOTE — Telephone Encounter (Signed)
Mrs. [name] Weber called and is requesting mrs [name] Weber to be moved back to out-patient therapy as she feels she was progressing better with [name] Weber and this type of treatment, please advise.

## 2017-03-11 ENCOUNTER — Encounter: Payer: Self-pay | Admitting: Physical Therapy

## 2017-03-11 NOTE — Therapy (Signed)
[facility] [address] [place], [place], [address] Phone: [phone]   Fax:  [phone]  Patient Details  Name: [name] Weber MRN: [id number] Date of Birth: [birth year] Referring Provider:  No ref. provider found  Encounter Date: 2017/04/10   Addendum: 01/27/2017 G Codes Functional Assessment Tool: Pt discharged unexpectedly due to fall/pelvic fracture Functional Limitation: Mobility: Walking and Moving Around  Goal Status  [id number]): CJ Discharge STATUS:  CK  PHYSICAL THERAPY DISCHARGE SUMMARY  Visits from Start of Care: 18 (discharged per phone call from caregiver 2017-01-27)  Current functional level related to goals / functional outcomes: See LTGs-pt was due for goal check just after cancellation due to fall   Remaining deficits: Fall risk, decreased balance, safety awareness   Education / Equipment: Educated in [facility]  Plan: Patient agrees to discharge.  Patient goals were not met. Patient is being discharged due to a change in medical status.  ?????Caregiver reported fall, pelvic fracture, hospitalization.  Frazier Butt., PT    Mady Haagensen W. 2017/04/10, 2:56 PM  [facility] [address] [place], [place], [address] Phone: [phone]   Fax:  [phone]

## 2017-03-12 ENCOUNTER — Ambulatory Visit: Payer: Medicare Other | Attending: Physical Medicine & Rehabilitation | Admitting: Physical Therapy

## 2017-03-12 DIAGNOSIS — M6281 Muscle weakness (generalized): Secondary | ICD-10-CM

## 2017-03-12 DIAGNOSIS — R2689 Other abnormalities of gait and mobility: Secondary | ICD-10-CM | POA: Diagnosis not present

## 2017-03-12 DIAGNOSIS — R293 Abnormal posture: Secondary | ICD-10-CM | POA: Diagnosis present

## 2017-03-12 DIAGNOSIS — R2681 Unsteadiness on feet: Secondary | ICD-10-CM | POA: Diagnosis present
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
## 2017-03-19 ENCOUNTER — Ambulatory Visit: Payer: Medicare Other | Admitting: Physical Therapy

## 2017-03-19 DIAGNOSIS — R2689 Other abnormalities of gait and mobility: Secondary | ICD-10-CM | POA: Diagnosis not present

## 2017-03-19 DIAGNOSIS — R293 Abnormal posture: Secondary | ICD-10-CM

## 2017-03-20 NOTE — Therapy (Signed)
[facility] [address] [place], [place], [address] Phone: [phone]   Fax:  [phone]  Physical Therapy Treatment  Patient Details  Name: [name] Weber MRN: [id number] Date of Birth: 04-14-1931 Referring Provider: Riley Kill  Encounter Date: 03/19/2017      PT End of Session - 03/20/17 1923    Visit Number 2  New episode of care 03/12/17   Number of Visits 17   Date for PT Re-Evaluation 05/11/17   Authorization Type UHC Medicare-GCODE every 10th visit   PT Start Time 1021   PT Stop Time 1104   PT Time Calculation (min) 43 min   Equipment Utilized During Treatment Gait belt   Activity Tolerance Patient tolerated treatment well   Behavior During Therapy [facility] for tasks assessed/performed      Past Medical History:  Diagnosis Date  . Fatigue   . Malaise   . Myalgia   . PAC (premature atrial contraction)    ISOLATED  . Palpitations    OCCASSIONAL  . Paroxysmal atrial flutter (HCC)   . PVC's (premature ventricular contractions)    ISOLATED    Past Surgical History:  Procedure Laterality Date  . THORACIC LAMINECTOMY FOR EPIDURAL ABSCESS N/A 11/16/2014   Procedure: THORACIC LAMINECTOMY FOR EPIDURAL ABSCESS;  Surgeon: Karn Cassis, MD;  Location: [facility];  Service: Neurosurgery;  Laterality: N/A;  . VULVAR LESION REMOVAL  01/01/2009   She had a 1-cm area of erythema to the right of the urethral meatus    There were no vitals filed for this visit.      Subjective Assessment - 03/20/17 1921    Subjective Went to beach with Vie-had a good time, good friends and good food.   Patient is accompained by: Family member  friend, POA-Vie   Pertinent History R pelvic fracture 12/25/16, T9 paraplegia, history of compression fractures; unable to wear AFOs currently due to hammer toe deformity L >R foot; blister on R heel (healed), recent UTI   Patient Stated Goals Pt's goal is for guidance for how to  continue to get stronger.   Currently in Pain? No/denies        Orthotic Fit/train: 1) Gait x 120 using RW, wearing bilateral toe-off AFOs.  (PT fully dons and doffs bilateral toe-off AFOs with new sneakers)  During gait, patient noted to have increased R toeing in, steppage gait bilateral, with bilateral knees flexed in stance phase, bilateral foot flat (no heelstrike noted).  Pt tends to push RW too far in front of her, with increased forward lean on walker.  Gait velocity in 10 meter gait:  16.78 sec = 1.95 ft/sec  2)  Gait x 120 ft using RW, wearing bilateral Ossur-light AFOs.  (PT fully dons and doffs bilateral Ossur-light AFOs with new sneakers).  Immediately, pt c/0 6/10 discomfort in L foot/toes due to extreme hammer-toe and foot deformity in toes 3-5.  In shoes, PT notes bulge of toes through top of toe-box area.  Shoe inserts do not fully fit, as they are too large to use with Ossur braces.  During gait, patient noted to achieve bilateral heelstrike and knee extension in stance phase.  Pt appears to be more upright in BOS of walker.  Gait velocity in 20 ft:  10.69 sec = 1.87 ft/sec  3)  Gait x 120 ft using RW, no AFOs wearing Moccasin-like bedroom slippers.  During gait , pt noted to have steppage gait, bilateral knees flexed in stance phase,  foot flat bilaterally, with no heelstrike.  Slippers are slipping up and down on R heel during gait.  Gait velocity in 20 ft:  10.35 sec = 1.93 ft/sec  Assessed skin after second bout of AFO trials, with noted redness in posterior aspect of R heel.  Redness still present at end of session.  Pt and friend aware.  No foot drag or LOB noted with any gait activities.  Following gait, PT discussed benefits and risks of the above: 1) Benefits bilateral toe-off AFOs and sneakers  -This is what patient had been using in therapy sessions and at home prior to most recent hospitalization.  -Best gait velocity of the three trials       Negatives of bilateral  toe-off AFOs and sneakers  -Large, "clunky" feel of new shoes to accomodate AFOs is potential trip hazard due to pt's neuropathy, decreased sensation  -Pushes walker too far in front of her  -Gait pattern-bilateral knees flexed with delayed knee extension in stance (likely somewhat due to tightness in hamstrings and heelcords)  -Difficulty for patient to correctly and independently don and dof toe-off AFOs  2)  Benefits of bilateral Ossur-light and sneakers  -Patient achieved bilateral heelstrike and knee extension during stance phase        Negatives of bilateral Ossur-light and sneakers  -Uncomfortable fit in shoe with shoe's insert  -Reported pain in L toes due to shift of forefoot in toebox of shoe  -History of R heel ulcer (healed) from use of this brace (per patient report), with redness noted in R heel after trial  -difficulty for patient to correctly and independently don and doff   3)  Benefits of typical, supportive, sturdy footwear (such as [place], Skechers, I-cloud-per report of caregiver) with no AFO  -More natural feeling with less bulkiness of footwear as compared to larger size sneakers  -Patient does not have to don/doff AFOs  -When asked-pt immediately replies she wants "neither of the braces"      Negatives of typical supportive, sturdy footwear/no AFO  -Gait pattern of steppage gait, foot flat, knees flexed in stance    With detailed discussion, PT, patient, and caregiver/POA/friend, Vie, are in agreement to try gait without any AFOs at this time, due to risks and negatives outweigh benefits.  Retia Passe is concerned because patient is mostly wearing grip socks at home, not shoes.  Discussed safety concerns of not wearing shoes-neuropathy, possible decreased grip.  Pt agrees to wear Moccasin-like bedroom shoes until patient and Retia Passe can find sturdy, supportive footwear, as discussed.  Discussed that current bedroom shoes not a viable option long term due to them slipping up and  down on pt's heel during gait, posing safety hazard.                          PT Education - 03/20/17 1922    Education provided Yes   Education Details Benefits/risks of pt's 2 current types of AFOs versus no AFOs   Person(s) Educated Patient;Other (comment)  Andreas Blower   Methods Explanation;Demonstration   Comprehension Verbalized understanding          PT Short Term Goals - 03/15/17 0901      PT SHORT TERM GOAL #1   Title Pt will be independent with HEP for imrpoved strength, balance, and gait.  TARGET 04/11/17   Time 4   Period Weeks   Status New     PT SHORT TERM GOAL #2  Title Pt will perform sit<>stand transfers, at least 4 of 5 trials, with minimal UE support, modified independently for improved transfer efficiency and safety.   Time 4   Period Weeks   Status New     PT SHORT TERM GOAL #3   Title Pt will improve TUG score to less than or equal to 19 seconds for decreased fall risk.   Time 4   Period Weeks   Status New     PT SHORT TERM GOAL #4   Title Pt will improve Berg Balance score to at least 27/56 for decreased fall risk.   Time 4   Period Weeks   Status New           PT Long Term Goals - 03/15/17 0903      PT LONG TERM GOAL #1   Title Pt/caregiver Andreas Blower), will verbalize understanding of fall prevention within the home environmen.t  TARGET 05/11/17   Time 8   Period Weeks   Status New     PT LONG TERM GOAL #2   Title Pt will improve TUG score to less than or equal to 16 seconds for decreased fall risk.   Time 8   Period Weeks   Status New     PT LONG TERM GOAL #3   Title Pt will improve Berg BAlance score to at least 32/56 for decreased fall risk.     Time 8   Period Weeks   Status New     PT LONG TERM GOAL #4   Title Pt will improve gait velocity to at least 2.4 ft/sec for improved gait efficiency and safety with household and community gait.     Time 8   Period Weeks   Status New     PT LONG TERM GOAL  #5   Title Pt/caregiver will verbalize plans for continued fitness post D/C from PT.   Time 8   Period Weeks   Status New               Plan - 03/20/17 1924    Clinical Impression Statement Per patient and POA [facility]) request, session focused on trial and discussion of pt's previous toe-off AFOs, Ossur AFOs provided at recent hospitalization, and no AFOs.  Both sets of AFOs require larger than typical sneaker-type shoes for this patient, who has history of neuropathy and risks for AFOs seem to outweight benefits-see full discussion in treatment note.  Patient and POA, Retia Passe, agree to try to get sturdy shoe for patient (versus bedroom shoe Moccasin she is wearing now) and work on gait training and balance with sturdy, supportive footwear.     Rehab Potential Good   PT Frequency 2x / week   PT Duration 8 weeks  plus eval 03/12/17   PT Treatment/Interventions ADLs/Self Care Home Management;Functional mobility training;Gait training;DME Instruction;Therapeutic activities;Therapeutic exercise;Balance training;Neuromuscular re-education;Patient/family education;Orthotic Fit/Training   PT Next Visit Plan Initiate HEP for stretching, strengthening, balance and gait.  Check whether patient has gotten new shoes.   Consulted and Agree with Plan of Care Patient;Other (Comment)   Family Member Consulted Friend, POA-Vie      Patient will benefit from skilled therapeutic intervention in order to improve the following deficits and impairments:  Abnormal gait, Decreased balance, Decreased mobility, Decreased safety awareness, Decreased range of motion, Decreased strength, Impaired flexibility, Postural dysfunction  Visit Diagnosis: Other abnormalities of gait and mobility  Abnormal posture     Problem List Patient Active Problem List   Diagnosis Date  Noted  . Other osteoporosis with current pathological fracture   . Chronic midline low back pain without sciatica   . Thrombus   . Closed  nondisplaced fracture of pelvis with routine healing   . Pelvic ring fracture (HCC)   . Osteoporosis with current pathological fracture   . Slow transit constipation   . Fractures   . Pain   . Generalized anxiety disorder   . Benign essential HTN   . Hypoalbuminemia due to protein-calorie malnutrition (HCC)   . Thrombocytopenia (HCC)   . Pulmonary hypertension (HCC)   . Age-related osteoporosis with current pathological fracture   . Pelvic fracture (HCC) 12/26/2016  . Multiple closed fractures of pelvis with stable disruption of pelvic circle (HCC)   . Memory loss   . Lumbar burst fracture (HCC) 06/08/2016  . Lumbar compression fracture (HCC) 06/08/2016  . Bilateral foot-drop   . Midline thoracic back pain   . A-fib (HCC)   . Depression   . Constipation due to pain medication   . Chronic constipation   . Compression fracture of L2 (HCC) 05/30/2016  . Elevated blood pressure 05/30/2016  . L2 vertebral fracture (HCC) 05/30/2016  . Anxiety state 12/10/2014  . Neurogenic bladder 11/30/2014  . Neurogenic bowel 11/30/2014  . Bacterial UTI 11/30/2014  . Thoracic myelopathy 11/29/2014  . Persistent atrial fibrillation (HCC) 11/27/2014  . Traumatic spinal subdural hematoma   . Acute pulmonary embolism (HCC)   . Orthostatic hypotension 11/22/2014  . Near syncope 11/21/2014  . Paraplegia at T9 level (HCC) 11/17/2014  . Weakness of both legs   . Nocturnal leg cramps 07/16/2014  . Encounter for therapeutic drug monitoring 12/25/2013  . Malaise and fatigue 06/01/2011  . Atrial flutter (HCC) 03/10/2011  . Long term (current) use of anticoagulants 03/10/2011  . Benign hypertensive heart disease without heart failure 03/10/2011  . HLD (hyperlipidemia) 03/10/2011  . Paroxysmal atrial flutter (HCC)   . Palpitations   . PAC (premature atrial contraction)   . PVC's (premature ventricular contractions)   . Malaise   . Fatigue   . Myalgia     MARRIOTT,AMY W. 03/20/2017, 7:30 PM   Gean Maidens., PT  [facility] [address] [place], [place], [address] Phone: [phone]   Fax:  [phone]  Name: EDDITH MENTOR MRN: [id number] Date of Birth: 1931/07/20

## 2017-03-21 ENCOUNTER — Other Ambulatory Visit: Payer: Self-pay | Admitting: Internal Medicine

## 2017-03-23 ENCOUNTER — Ambulatory Visit: Payer: Medicare Other | Admitting: Physical Therapy

## 2017-03-24 ENCOUNTER — Ambulatory Visit: Payer: Medicare Other | Admitting: Physical Therapy

## 2017-03-24 ENCOUNTER — Encounter: Payer: Self-pay | Admitting: Physical Therapy

## 2017-03-24 DIAGNOSIS — R2681 Unsteadiness on feet: Secondary | ICD-10-CM

## 2017-03-24 DIAGNOSIS — R2689 Other abnormalities of gait and mobility: Secondary | ICD-10-CM | POA: Diagnosis not present

## 2017-03-24 DIAGNOSIS — M6281 Muscle weakness (generalized): Secondary | ICD-10-CM

## 2017-03-24 NOTE — Therapy (Signed)
[facility] [address] [place], [place], [address] Phone: [phone]   Fax:  [phone]  Physical Therapy Treatment  Patient Details  Name: [name] Weber MRN: [id number] Date of Birth: 05-08-1931 Referring Provider: Riley Kill  Encounter Date: 03/24/2017      PT End of Session - 03/24/17 1524    Visit Number 3  New episode of care 03/12/17   Number of Visits 17   Date for PT Re-Evaluation 05/11/17   Authorization Type UHC Medicare-GCODE every 10th visit   PT Start Time 0934   PT Stop Time 1016   PT Time Calculation (min) 42 min   Activity Tolerance Patient tolerated treatment well   Behavior During Therapy [facility] for tasks assessed/performed      Past Medical History:  Diagnosis Date  . Fatigue   . Malaise   . Myalgia   . PAC (premature atrial contraction)    ISOLATED  . Palpitations    OCCASSIONAL  . Paroxysmal atrial flutter (HCC)   . PVC's (premature ventricular contractions)    ISOLATED    Past Surgical History:  Procedure Laterality Date  . THORACIC LAMINECTOMY FOR EPIDURAL ABSCESS N/A 11/16/2014   Procedure: THORACIC LAMINECTOMY FOR EPIDURAL ABSCESS;  Surgeon: Karn Cassis, MD;  Location: [facility];  Service: Neurosurgery;  Laterality: N/A;  . VULVAR LESION REMOVAL  01/01/2009   She had a 1-cm area of erythema to the right of the urethral meatus    There were no vitals filed for this visit.      Subjective Assessment - 03/24/17 0937    Subjective no falls, nothing new to report. Retia Passe (friend) reports pt refused to buy new shoes when she offered. She also reports pt has a caregiver/college student coming in each morning from 8-10 and plans to ask her to do the HEP with pt.    Currently in Pain? No/denies                         [facility] Adult PT Treatment/Exercise - 03/24/17 0001      Bed Mobility   Bed Mobility Rolling Right;Rolling Left;Right Sidelying to Sit;Sit to  Supine   Rolling Right 6: Modified independent (Device/Increase time)   Rolling Left 6: Modified independent (Device/Increase time)   Right Sidelying to Sit 5: Supervision;HOB flat  vc for side to sit to protect her back   Sit to Supine 6: Modified independent (Device/Increase time)     Transfers   Transfers Sit to Stand;Stand to Sit   Sit to Stand 5: Supervision;With upper extremity assist;From chair/3-in-1   Stand to Sit 4: Min guard;With upper extremity assist;Uncontrolled descent     Ambulation/Gait   Ambulation/Gait Assistance 4: Min guard   Ambulation/Gait Assistance Details vc for safe use of RW, upright posture   Ambulation Distance (Feet) 120 Feet   Assistive device Rolling walker   Gait Pattern Step-through pattern;Decreased step length - right;Decreased step length - left;Decreased dorsiflexion - right;Decreased dorsiflexion - left;Right flexed knee in stance;Left flexed knee in stance;Wide base of support;Trunk flexed   Ambulation Surface Level;Indoor     Exercises   Exercises Lumbar     Lumbar Exercises: Stretches   Single Knee to Chest Stretch 1 rep;30 seconds   Pelvic Tilt --  10 reps, 5 sec     Knee/Hip Exercises: Seated   Long Arc Quad Strengthening;1 set;10 reps  3 sec hold with pt fatiguing after 8 reps and unable  to hold   Clamshell with TheraBand Red;Green  x 15 reps each; pt denied fatigue of hip abdct     Knee/Hip Exercises: Supine   Other Supine Knee/Hip Exercises clamshell with green x 10 reps     Knee/Hip Exercises: Sidelying   Clams 10 with green band; bil reported back pain and no hip abdct fatigue                PT Education - 03/24/17 1522    Education provided Yes   Education Details Pelvic tilt, knee to chest, sidelying clamshell exercises for HEP; again addressed need for new more supportive shoes    Person(s) Educated Patient;Other (comment)  Andreas Blower   Methods Explanation;Demonstration;Tactile cues;Verbal cues;Handout    Comprehension Verbalized understanding;Returned demonstration;Verbal cues required;Tactile cues required;Need further instruction          PT Short Term Goals - 03/24/17 1533      PT SHORT TERM GOAL #1   Title Pt will be independent with HEP for imrpoved strength, balance, and gait.  TARGET 04/11/17   Time 4   Period Weeks   Status On-going     PT SHORT TERM GOAL #2   Title Pt will perform sit<>stand transfers, at least 4 of 5 trials, with minimal UE support, modified independently for improved transfer efficiency and safety.   Time 4   Period Weeks   Status On-going     PT SHORT TERM GOAL #3   Title Pt will improve TUG score to less than or equal to 19 seconds for decreased fall risk.   Time 4   Period Weeks   Status On-going     PT SHORT TERM GOAL #4   Title Pt will improve Berg Balance score to at least 27/56 for decreased fall risk.   Time 4   Period Weeks   Status On-going           PT Long Term Goals - 03/15/17 0903      PT LONG TERM GOAL #1   Title Pt/caregiver Andreas Blower), will verbalize understanding of fall prevention within the home environmen.t  TARGET 05/11/17   Time 8   Period Weeks   Status New     PT LONG TERM GOAL #2   Title Pt will improve TUG score to less than or equal to 16 seconds for decreased fall risk.   Time 8   Period Weeks   Status New     PT LONG TERM GOAL #3   Title Pt will improve Berg BAlance score to at least 32/56 for decreased fall risk.     Time 8   Period Weeks   Status New     PT LONG TERM GOAL #4   Title Pt will improve gait velocity to at least 2.4 ft/sec for improved gait efficiency and safety with household and community gait.     Time 8   Period Weeks   Status New     PT LONG TERM GOAL #5   Title Pt/caregiver will verbalize plans for continued fitness post D/C from PT.   Time 8   Period Weeks   Status New               Plan - 03/24/17 1526    Clinical Impression Statement Patient did not recall  previously discussing with PT the need for more supportive footwear. POA Retia Passe) recalled conversation for patient and she ultimately agreed to go shopping with [employer] for new shoes. Inspected patient's feet  for any skin breakdown with only slight redness noted where shoe crosses her achilles tendons. Patient presented exercise sheets from HEP given by HHPT. She did not recall having done most of the exercises. Session focused on initiating a new HEP that patient can tolerate (with respect to number of repetitions, appropriate resistance bands, and without increasing her back pain). Patient expressed understanding of why the HEP is important and Retia Passe states she will have the caregiver that is with pt 2 hours each morning do the exercises with the patient.    Rehab Potential Good   PT Frequency 2x / week   PT Duration 8 weeks  plus eval 03/12/17   PT Treatment/Interventions ADLs/Self Care Home Management;Functional mobility training;Gait training;DME Instruction;Therapeutic activities;Therapeutic exercise;Balance training;Neuromuscular re-education;Patient/family education;Orthotic Fit/Training   PT Next Visit Plan Print HEP created 4/24 to give pt (and return the pages from HHPT to her); review HEP and add ex's as approp for stretching, strengthening, balance and gait.  Check whether patient has gotten new shoes.   Consulted and Agree with Plan of Care Patient;Other (Comment)   Family Member Consulted Friend, POA-Vie      Patient will benefit from skilled therapeutic intervention in order to improve the following deficits and impairments:  Abnormal gait, Decreased balance, Decreased mobility, Decreased safety awareness, Decreased range of motion, Decreased strength, Impaired flexibility, Postural dysfunction  Visit Diagnosis: Other abnormalities of gait and mobility  Muscle weakness (generalized)  Unsteadiness on feet     Problem List Patient Active Problem List   Diagnosis Date Noted  . Other  osteoporosis with current pathological fracture   . Chronic midline low back pain without sciatica   . Thrombus   . Closed nondisplaced fracture of pelvis with routine healing   . Pelvic ring fracture (HCC)   . Osteoporosis with current pathological fracture   . Slow transit constipation   . Fractures   . Pain   . Generalized anxiety disorder   . Benign essential HTN   . Hypoalbuminemia due to protein-calorie malnutrition (HCC)   . Thrombocytopenia (HCC)   . Pulmonary hypertension (HCC)   . Age-related osteoporosis with current pathological fracture   . Pelvic fracture (HCC) 12/26/2016  . Multiple closed fractures of pelvis with stable disruption of pelvic circle (HCC)   . Memory loss   . Lumbar burst fracture (HCC) 06/08/2016  . Lumbar compression fracture (HCC) 06/08/2016  . Bilateral foot-drop   . Midline thoracic back pain   . A-fib (HCC)   . Depression   . Constipation due to pain medication   . Chronic constipation   . Compression fracture of L2 (HCC) 05/30/2016  . Elevated blood pressure 05/30/2016  . L2 vertebral fracture (HCC) 05/30/2016  . Anxiety state 12/10/2014  . Neurogenic bladder 11/30/2014  . Neurogenic bowel 11/30/2014  . Bacterial UTI 11/30/2014  . Thoracic myelopathy 11/29/2014  . Persistent atrial fibrillation (HCC) 11/27/2014  . Traumatic spinal subdural hematoma   . Acute pulmonary embolism (HCC)   . Orthostatic hypotension 11/22/2014  . Near syncope 11/21/2014  . Paraplegia at T9 level (HCC) 11/17/2014  . Weakness of both legs   . Nocturnal leg cramps 07/16/2014  . Encounter for therapeutic drug monitoring 12/25/2013  . Malaise and fatigue 06/01/2011  . Atrial flutter (HCC) 03/10/2011  . Long term (current) use of anticoagulants 03/10/2011  . Benign hypertensive heart disease without heart failure 03/10/2011  . HLD (hyperlipidemia) 03/10/2011  . Paroxysmal atrial flutter (HCC)   . Palpitations   . PAC (  premature atrial contraction)   . PVC's  (premature ventricular contractions)   . Malaise   . Fatigue   . Myalgia     Zena Amos, PT 03/24/2017, 3:35 PM  [facility] [address] [place], [place], [address] Phone: [phone]   Fax:  [phone]  Name: [name] Weber MRN: [id number] Date of Birth: [birth year]

## 2017-03-24 NOTE — Patient Instructions (Addendum)
Exercises lying down   (Home) Flexion: Pelvic Tilt    Lie with neck supported, knees bent, feet flat. Tighten your bottom (squeeze your bottom/cheeks together), pushing back down against surface.  Repeat __10__ times per set. Do __2__ sets per session.   Copyright  VHI. All rights reserved.   PELVIC STABILIZATION: Basic Bridge    Tighten stomach and buttocks then lift hips slowly.  Hold for _5 seconds_ then release hips back to floor. Repeat _10__ times. Do _2__ sets per day.  Copyright  VHI. All rights reserved.       Knee to Chest (Flexion)    Pull knee toward chest. Feel stretch in lower back or buttock area. Breathing deeply, Hold __30__ seconds. Repeat with other knee. Repeat __3__ times. Do ___1_ sessions per day.  http://gt2.exer.us/226   Copyright  VHI. All rights reserved.    Abduction: Clam (Eccentric) - Side-Lying   Lie on side with knees bent and green band around your thighs. Lift top knee, keeping feet together. Keep trunk steady. Hold for 3 seconds. 15 reps per set, 2 sets per day, 5 days per week.  Copyright  VHI. All rights reserved.    Sitting Exercises  KNEE: Extension, Long Arc Quads - Sitting    Slowly raise foot until knee is straight. Hold for 3 seconds. Slowly lower foot to floor. __15_ reps per set, _2__ sets per day, __5_ days per week  Copyright  VHI. All rights reserved.     Ankle Bend: Dorsiflexion / Plantar Flexion, Sitting    Sit with feet on floor. Pull toes up, keeping both heels on floor. Then press toes to floor raising heels. Hold each position __3_ seconds. Repeat _15__ times per session. Do __2_ sessions per day.  Copyright  VHI. All rights reserved.      HIP / KNEE: Extension - Sit to Stand    ** WITH WALKER IN FRONT OF YOU**  Sitting, lean chest forward, and come to stand. Backs of legs should not push off surface. Place hands on the handles of the walker. Hold upright posture for 3 seconds.  Release walker and reach back for chair/bed and return to sitting. _5__ reps per set, __2_ sets per day, __5_ days per week   Copyright  VHI. All rights reserved.

## 2017-03-25 ENCOUNTER — Ambulatory Visit: Payer: Medicare Other | Admitting: Physical Therapy

## 2017-03-25 DIAGNOSIS — M6281 Muscle weakness (generalized): Secondary | ICD-10-CM

## 2017-03-25 DIAGNOSIS — R2689 Other abnormalities of gait and mobility: Secondary | ICD-10-CM | POA: Diagnosis not present

## 2017-03-25 NOTE — Therapy (Signed)
[facility] [address] [place], [place], [address] Phone: [phone]   Fax:  [phone]  Physical Therapy Treatment  Patient Details  Name: [name] Weber MRN: [id number] Date of Birth: 04-12-1931 Referring Provider: Riley Kill  Encounter Date: 03/25/2017      PT End of Session - 03/25/17 2029    Visit Number 4  New episode of care 03/12/17   Number of Visits 17   Date for PT Re-Evaluation 05/11/17   Authorization Type UHC Medicare-GCODE every 10th visit   PT Start Time 1020   PT Stop Time 1100   PT Time Calculation (min) 40 min   Activity Tolerance Patient tolerated treatment well   Behavior During Therapy [facility] for tasks assessed/performed      Past Medical History:  Diagnosis Date  . Fatigue   . Malaise   . Myalgia   . PAC (premature atrial contraction)    ISOLATED  . Palpitations    OCCASSIONAL  . Paroxysmal atrial flutter (HCC)   . PVC's (premature ventricular contractions)    ISOLATED    Past Surgical History:  Procedure Laterality Date  . THORACIC LAMINECTOMY FOR EPIDURAL ABSCESS N/A 11/16/2014   Procedure: THORACIC LAMINECTOMY FOR EPIDURAL ABSCESS;  Surgeon: Karn Cassis, MD;  Location: [facility];  Service: Neurosurgery;  Laterality: N/A;  . VULVAR LESION REMOVAL  01/01/2009   She had a 1-cm area of erythema to the right of the urethral meatus    There were no vitals filed for this visit.      Subjective Assessment - 03/25/17 1025    Subjective Nothing new since yesterday.  Tri (am caregiver is present today for session)   Patient Stated Goals Pt's goal is for guidance for how to continue to get stronger.   Currently in Pain? No/denies                         [facility] Adult PT Treatment/Exercise - 03/25/17 0001      Transfers   Transfers Sit to Stand;Stand to Sit   Sit to Stand 5: Supervision;With upper extremity assist;From bed   Stand to Sit 5: Supervision;With  upper extremity assist;To bed   Number of Reps 10 reps  for functional strengthening     Exercises   Exercises Other Exercises;Ankle   Other Exercises  Previous session-therapist reviewed/assessed pt's current HEP and updated and made changes.  Initiated updated HEP with patient and provided handout this visit.     Lumbar Exercises: Stretches   Active Hamstring Stretch 3 reps;30 seconds  Seated foot propped on floor-added to HEP today   Single Knee to Chest Stretch 3 reps;30 seconds  each leg   Pelvic Tilt --  10 reps, 3-5 second hold     Knee/Hip Exercises: Seated   Long Arc Quad Strengthening;1 set;10 reps   Long [employer] Limitations Cues for upright posture throughout     Knee/Hip Exercises: Supine   Other Supine Knee/Hip Exercises Bridging x 10 reps     Knee/Hip Exercises: Sidelying   Clams 10 with green band; bil reported back pain and no hip abdct fatigue     Ankle Exercises: Standing   Heel Raises 10 reps   Toe Raise 10 reps      Performed seated postural exercises with red theraband (from old/previous exercise pictures).  Had patient perform to check on safety and correct performance of UE scapular rows, shoulder flexion, and diagonal shoulder lifts.  Pt demo understanding with picture and with verbal cues.  Cues to slow pace.          PT Education - 03/25/17 2028    Education provided Yes   Education Details Provided handout and reviewed updated HEP   Person(s) Educated Patient;Other (comment)  Tri-morning caregiver who may help with HEP   Methods Explanation;Demonstration;Tactile cues;Verbal cues;Handout   Comprehension Verbalized understanding;Returned demonstration;Verbal cues required          PT Short Term Goals - 03/24/17 1533      PT SHORT TERM GOAL #1   Title Pt will be independent with HEP for imrpoved strength, balance, and gait.  TARGET 04/11/17   Time 4   Period Weeks   Status On-going     PT SHORT TERM GOAL #2   Title Pt will perform  sit<>stand transfers, at least 4 of 5 trials, with minimal UE support, modified independently for improved transfer efficiency and safety.   Time 4   Period Weeks   Status On-going     PT SHORT TERM GOAL #3   Title Pt will improve TUG score to less than or equal to 19 seconds for decreased fall risk.   Time 4   Period Weeks   Status On-going     PT SHORT TERM GOAL #4   Title Pt will improve Berg Balance score to at least 27/56 for decreased fall risk.   Time 4   Period Weeks   Status On-going           PT Long Term Goals - 03/15/17 0903      PT LONG TERM GOAL #1   Title Pt/caregiver Andreas Blower), will verbalize understanding of fall prevention within the home environmen.t  TARGET 05/11/17   Time 8   Period Weeks   Status New     PT LONG TERM GOAL #2   Title Pt will improve TUG score to less than or equal to 16 seconds for decreased fall risk.   Time 8   Period Weeks   Status New     PT LONG TERM GOAL #3   Title Pt will improve Berg BAlance score to at least 32/56 for decreased fall risk.     Time 8   Period Weeks   Status New     PT LONG TERM GOAL #4   Title Pt will improve gait velocity to at least 2.4 ft/sec for improved gait efficiency and safety with household and community gait.     Time 8   Period Weeks   Status New     PT LONG TERM GOAL #5   Title Pt/caregiver will verbalize plans for continued fitness post D/C from PT.   Time 8   Period Weeks   Status New               Plan - 03/25/17 2030    Clinical Impression Statement Focused treatment session today on providing concise, updated HEP (replacing previous HEP).  Pt able to perform HEP with therapist cues for instructions without c/o back pain or fatigue.  Educated caregiver who was present today, regarding quality of movement, technique of exercise and avoiding pain.  Pt will continue to benefit from skilled PT to address strengthening and balance, gait.   Rehab Potential Good   PT Frequency  2x / week   PT Duration 8 weeks  plus eval 03/12/17   PT Treatment/Interventions ADLs/Self Care Home Management;Functional mobility training;Gait training;DME Instruction;Therapeutic activities;Therapeutic exercise;Balance training;Neuromuscular re-education;Patient/family education;Orthotic  Fit/Training   PT Next Visit Plan Review HEP given out 4/26 as needed.  Continue to work on functional strengthening, balance and gait.  Check on shoes-   Consulted and Agree with Plan of Care Patient;Other (Comment)   Family Member Consulted Tri-morning caregiver      Patient will benefit from skilled therapeutic intervention in order to improve the following deficits and impairments:  Abnormal gait, Decreased balance, Decreased mobility, Decreased safety awareness, Decreased range of motion, Decreased strength, Impaired flexibility, Postural dysfunction  Visit Diagnosis: Muscle weakness (generalized)     Problem List Patient Active Problem List   Diagnosis Date Noted  . Other osteoporosis with current pathological fracture   . Chronic midline low back pain without sciatica   . Thrombus   . Closed nondisplaced fracture of pelvis with routine healing   . Pelvic ring fracture (HCC)   . Osteoporosis with current pathological fracture   . Slow transit constipation   . Fractures   . Pain   . Generalized anxiety disorder   . Benign essential HTN   . Hypoalbuminemia due to protein-calorie malnutrition (HCC)   . Thrombocytopenia (HCC)   . Pulmonary hypertension (HCC)   . Age-related osteoporosis with current pathological fracture   . Pelvic fracture (HCC) 12/26/2016  . Multiple closed fractures of pelvis with stable disruption of pelvic circle (HCC)   . Memory loss   . Lumbar burst fracture (HCC) 06/08/2016  . Lumbar compression fracture (HCC) 06/08/2016  . Bilateral foot-drop   . Midline thoracic back pain   . A-fib (HCC)   . Depression   . Constipation due to pain medication   . Chronic  constipation   . Compression fracture of L2 (HCC) 05/30/2016  . Elevated blood pressure 05/30/2016  . L2 vertebral fracture (HCC) 05/30/2016  . Anxiety state 12/10/2014  . Neurogenic bladder 11/30/2014  . Neurogenic bowel 11/30/2014  . Bacterial UTI 11/30/2014  . Thoracic myelopathy 11/29/2014  . Persistent atrial fibrillation (HCC) 11/27/2014  . Traumatic spinal subdural hematoma   . Acute pulmonary embolism (HCC)   . Orthostatic hypotension 11/22/2014  . Near syncope 11/21/2014  . Paraplegia at T9 level (HCC) 11/17/2014  . Weakness of both legs   . Nocturnal leg cramps 07/16/2014  . Encounter for therapeutic drug monitoring 12/25/2013  . Malaise and fatigue 06/01/2011  . Atrial flutter (HCC) 03/10/2011  . Long term (current) use of anticoagulants 03/10/2011  . Benign hypertensive heart disease without heart failure 03/10/2011  . HLD (hyperlipidemia) 03/10/2011  . Paroxysmal atrial flutter (HCC)   . Palpitations   . PAC (premature atrial contraction)   . PVC's (premature ventricular contractions)   . Malaise   . Fatigue   . Myalgia     Quanah Majka W. 03/25/2017, 8:34 PM  Gean Maidens., PT  [facility] [address] [place], [place], [address] Phone: [phone]   Fax:  [phone]  Name: OAKLEE ESTHER MRN: [id number] Date of Birth: May 30, 1931

## 2017-03-25 NOTE — Patient Instructions (Addendum)
HIP: Hamstrings - Short Sitting    Rest leg on raised surface or on the floor Keep knee straight.  Keep back straight. Keep chest tall and lean forward if needed to feel the stretch. Hold _30__ seconds. __3_ reps per set, _2__ sets per day, _5__ days per week  Copyright  VHI. All rights reserved.

## 2017-03-30 ENCOUNTER — Ambulatory Visit: Payer: Medicare Other | Attending: Physical Medicine & Rehabilitation | Admitting: Physical Therapy

## 2017-03-30 DIAGNOSIS — R293 Abnormal posture: Secondary | ICD-10-CM | POA: Diagnosis present

## 2017-03-30 DIAGNOSIS — R2689 Other abnormalities of gait and mobility: Secondary | ICD-10-CM | POA: Insufficient documentation

## 2017-03-30 DIAGNOSIS — R2681 Unsteadiness on feet: Secondary | ICD-10-CM | POA: Insufficient documentation

## 2017-03-30 DIAGNOSIS — M6281 Muscle weakness (generalized): Secondary | ICD-10-CM | POA: Insufficient documentation

## 2017-03-30 NOTE — Therapy (Signed)
[facility] [address] [place], [place], [address] Phone: [phone]   Fax:  [phone]  Physical Therapy Treatment  Patient Details  Name: [name] Weber MRN: [id number] Date of Birth: [birth year] Referring Provider: Riley Kill  Encounter Date: 03/30/2017      PT End of Session - 03/30/17 2207    Visit Number 5  New episode of care 03/12/17   Number of Visits 17   Date for PT Re-Evaluation 05/11/17   Authorization Type UHC Medicare-GCODE every 10th visit   PT Start Time 1149   PT Stop Time 1233   PT Time Calculation (min) 44 min   Equipment Utilized During Treatment Gait belt   Activity Tolerance Patient tolerated treatment well   Behavior During Therapy [facility] for tasks assessed/performed      Past Medical History:  Diagnosis Date  . Fatigue   . Malaise   . Myalgia   . PAC (premature atrial contraction)    ISOLATED  . Palpitations    OCCASSIONAL  . Paroxysmal atrial flutter (HCC)   . PVC's (premature ventricular contractions)    ISOLATED    Past Surgical History:  Procedure Laterality Date  . THORACIC LAMINECTOMY FOR EPIDURAL ABSCESS N/A 11/16/2014   Procedure: THORACIC LAMINECTOMY FOR EPIDURAL ABSCESS;  Surgeon: Karn Cassis, MD;  Location: [facility];  Service: Neurosurgery;  Laterality: N/A;  . VULVAR LESION REMOVAL  01/01/2009   She had a 1-cm area of erythema to the right of the urethral meatus    There were no vitals filed for this visit.      Subjective Assessment - 03/30/17 2151    Subjective Pt presents to OP PT wearing shoes that she is borrowing-slip-ons, good soles, Sketcher type shoes.  No c/o with them, no falls since last visit.  Retia Passe states she will take her to get new shoes.   Patient is accompained by: Family member  POA, Vie   Pertinent History R pelvic fracture 12/25/16, T9 paraplegia, history of compression fractures; unable to wear AFOs currently due to hammer toe  deformity L >R foot; blister on R heel (healed), recent UTI   Patient Stated Goals Pt's goal is for guidance for how to continue to get stronger.   Currently in Pain? No/denies                    Therapeutic Exercise:      [facility] Adult PT Treatment/Exercise - 03/30/17 1208      Exercises   Exercises Ankle   Other Exercises  Reviewed HEP handout provided last visit:  pelvic tilts x 10 reps, bridging x 10 reps, sidelying clamshell x 10 reps, LAQ x10, seated ankle dorsiflexion/plantarflexion, x 10reps, sit<>stand x 5 reps; hamstrings stretch seated x 3 reps 30 seconds.  Reviewed handout and pt return demo understanding.     Knee/Hip Exercises: Stretches   Active Hamstring Stretch Right;Left;30 seconds;2 reps   Active Hamstring Stretch Limitations seated position     Ankle Exercises: Seated   Other Seated Ankle Exercises Ankle eversion x 10 reps, with tactile cues        Neuro Re-education:      Balance Exercises - 03/30/17 2155      Balance Exercises: Standing   Partial Tandem Stance Eyes open;Upper extremity support 1;3 reps;30 secs   Sidestepping Upper extremity support;2 reps  Length of counter   Marching Limitations Marching in place, 10 reps bilateral UE support    Other Standing Exercises  Forward kicks x 10 reps bilateral UE support, forward step and weightshift, side step and weigthshift x 10 reps each with bilateral UE support.  Stagger stance forward/back weigthshifting x 10 reps focus on increased ankle dorsiflexion, then lateral weightshifting x 10 reps with lateral reaches, x 10 reps.     Self Care: Caregiver/friend, Retia Passe, asks about safety with patient going into laundry area and letting go of walker and bending down to replenish water for her pet dog.  Discussed safety concerns on how patient currently performs this activity, and PT suggested filling cup and taking on her walker tray, and using a chair to sit and pour water into the bowl.  They state a  chair is not feasible in this space.  PT suggested using small watering can with long spout so that she doesn't have to let go of walker and bend down so far to reach bowl.  Patient and caregiver report they will work on situation at home.        PT Short Term Goals - 03/24/17 1533      PT SHORT TERM GOAL #1   Title Pt will be independent with HEP for imrpoved strength, balance, and gait.  TARGET 04/11/17   Time 4   Period Weeks   Status On-going     PT SHORT TERM GOAL #2   Title Pt will perform sit<>stand transfers, at least 4 of 5 trials, with minimal UE support, modified independently for improved transfer efficiency and safety.   Time 4   Period Weeks   Status On-going     PT SHORT TERM GOAL #3   Title Pt will improve TUG score to less than or equal to 19 seconds for decreased fall risk.   Time 4   Period Weeks   Status On-going     PT SHORT TERM GOAL #4   Title Pt will improve Berg Balance score to at least 27/56 for decreased fall risk.   Time 4   Period Weeks   Status On-going           PT Long Term Goals - 03/15/17 0903      PT LONG TERM GOAL #1   Title Pt/caregiver Andreas Blower), will verbalize understanding of fall prevention within the home environmen.t  TARGET 05/11/17   Time 8   Period Weeks   Status New     PT LONG TERM GOAL #2   Title Pt will improve TUG score to less than or equal to 16 seconds for decreased fall risk.   Time 8   Period Weeks   Status New     PT LONG TERM GOAL #3   Title Pt will improve Berg BAlance score to at least 32/56 for decreased fall risk.     Time 8   Period Weeks   Status New     PT LONG TERM GOAL #4   Title Pt will improve gait velocity to at least 2.4 ft/sec for improved gait efficiency and safety with household and community gait.     Time 8   Period Weeks   Status New     PT LONG TERM GOAL #5   Title Pt/caregiver will verbalize plans for continued fitness post D/C from PT.   Time 8   Period Weeks   Status  New               Plan - 03/30/17 2208    Clinical Impression Statement Reviewed HEP provided last visit as well, working  on additional ankle exercises and standing balance exercises.  Pt able to perform HEP with written cues of HEP pictures.  Pt requires UE support for standing balance activities.  Pt will continue to benefit from skilled PT to address strengthening, balance, and gait.   Rehab Potential Good   PT Frequency 2x / week   PT Duration 8 weeks  plus eval 03/12/17   PT Treatment/Interventions ADLs/Self Care Home Management;Functional mobility training;Gait training;DME Instruction;Therapeutic activities;Therapeutic exercise;Balance training;Neuromuscular re-education;Patient/family education;Orthotic Fit/Training   PT Next Visit Plan Continue balance, functional strengthening and gait activities.   Consulted and Agree with Plan of Care Patient;Other (Comment)   Family Member [employer]      Patient will benefit from skilled therapeutic intervention in order to improve the following deficits and impairments:  Abnormal gait, Decreased balance, Decreased mobility, Decreased safety awareness, Decreased range of motion, Decreased strength, Impaired flexibility, Postural dysfunction  Visit Diagnosis: Muscle weakness (generalized)  Unsteadiness on feet     Problem List Patient Active Problem List   Diagnosis Date Noted  . Other osteoporosis with current pathological fracture   . Chronic midline low back pain without sciatica   . Thrombus   . Closed nondisplaced fracture of pelvis with routine healing   . Pelvic ring fracture (HCC)   . Osteoporosis with current pathological fracture   . Slow transit constipation   . Fractures   . Pain   . Generalized anxiety disorder   . Benign essential HTN   . Hypoalbuminemia due to protein-calorie malnutrition (HCC)   . Thrombocytopenia (HCC)   . Pulmonary hypertension (HCC)   . Age-related osteoporosis with current  pathological fracture   . Pelvic fracture (HCC) 12/26/2016  . Multiple closed fractures of pelvis with stable disruption of pelvic circle (HCC)   . Memory loss   . Lumbar burst fracture (HCC) 06/08/2016  . Lumbar compression fracture (HCC) 06/08/2016  . Bilateral foot-drop   . Midline thoracic back pain   . A-fib (HCC)   . Depression   . Constipation due to pain medication   . Chronic constipation   . Compression fracture of L2 (HCC) 05/30/2016  . Elevated blood pressure 05/30/2016  . L2 vertebral fracture (HCC) 05/30/2016  . Anxiety state 12/10/2014  . Neurogenic bladder 11/30/2014  . Neurogenic bowel 11/30/2014  . Bacterial UTI 11/30/2014  . Thoracic myelopathy 11/29/2014  . Persistent atrial fibrillation (HCC) 11/27/2014  . Traumatic spinal subdural hematoma   . Acute pulmonary embolism (HCC)   . Orthostatic hypotension 11/22/2014  . Near syncope 11/21/2014  . Paraplegia at T9 level (HCC) 11/17/2014  . Weakness of both legs   . Nocturnal leg cramps 07/16/2014  . Encounter for therapeutic drug monitoring 12/25/2013  . Malaise and fatigue 06/01/2011  . Atrial flutter (HCC) 03/10/2011  . Long term (current) use of anticoagulants 03/10/2011  . Benign hypertensive heart disease without heart failure 03/10/2011  . HLD (hyperlipidemia) 03/10/2011  . Paroxysmal atrial flutter (HCC)   . Palpitations   . PAC (premature atrial contraction)   . PVC's (premature ventricular contractions)   . Malaise   . Fatigue   . Myalgia     Muath Hallam W. 03/30/2017, 10:12 PM Gean Maidens., PT [facility] [address] [place], [place], [address] Phone: [phone]   Fax:  [phone]  Name: JAGGER BEAHM MRN: [id number] Date of Birth: 02-Dec-1930

## 2017-04-01 ENCOUNTER — Ambulatory Visit: Payer: Medicare Other | Admitting: Physical Therapy

## 2017-04-01 DIAGNOSIS — M6281 Muscle weakness (generalized): Secondary | ICD-10-CM

## 2017-04-01 DIAGNOSIS — R2689 Other abnormalities of gait and mobility: Secondary | ICD-10-CM

## 2017-04-01 DIAGNOSIS — R2681 Unsteadiness on feet: Secondary | ICD-10-CM

## 2017-04-01 NOTE — Therapy (Signed)
[facility] [address] [place], [place], [address] Phone: [phone]   Fax:  [phone]  Physical Therapy Treatment  Patient Details  Name: [name] Weber MRN: [id number] Date of Birth: 05-22-1931 Referring Provider: Riley Kill  Encounter Date: 04/01/2017      PT End of Session - 04/01/17 1430    Visit Number 6  New episode of care 03/12/17   Number of Visits 17   Date for PT Re-Evaluation 05/11/17   Authorization Type [employer] Medicare-GCODE every 10th visit   PT Start Time 1018   PT Stop Time 1100   PT Time Calculation (min) 42 min   Equipment Utilized During Treatment Gait belt   Activity Tolerance Patient tolerated treatment well   Behavior During Therapy [facility] for tasks assessed/performed      Past Medical History:  Diagnosis Date  . Fatigue   . Malaise   . Myalgia   . PAC (premature atrial contraction)    ISOLATED  . Palpitations    OCCASSIONAL  . Paroxysmal atrial flutter (HCC)   . PVC's (premature ventricular contractions)    ISOLATED    Past Surgical History:  Procedure Laterality Date  . THORACIC LAMINECTOMY FOR EPIDURAL ABSCESS N/A 11/16/2014   Procedure: THORACIC LAMINECTOMY FOR EPIDURAL ABSCESS;  Surgeon: Karn Cassis, MD;  Location: [facility];  Service: Neurosurgery;  Laterality: N/A;  . VULVAR LESION REMOVAL  01/01/2009   She had a 1-cm area of erythema to the right of the urethral meatus    There were no vitals filed for this visit.      Subjective Assessment - 04/01/17 1020    Subjective Just dragging a little bit today.  Nothing unusual, just tired and didn't want to have to go out today.   Patient is accompained by: Family member  Friend, Tri   Pertinent History R pelvic fracture 12/25/16, T9 paraplegia, history of compression fractures; unable to wear AFOs currently due to hammer toe deformity L >R foot; blister on R heel (healed), recent UTI   Patient Stated Goals Pt's goal  is for guidance for how to continue to get stronger.   Currently in Pain? No/denies                         [facility] Adult PT Treatment/Exercise - 04/01/17 1024      Ambulation/Gait   Ambulation/Gait Yes   Ambulation/Gait Assistance 4: Min guard   Ambulation/Gait Assistance Details Verbal cues for heelstrike, knee extension in stance phase and push off for improved gait pattern during short distances in gym area between activities.  Pt's tendency is to start with knees flexed, feet flat pattern.   Ambulation Distance (Feet) 60 Feet  x 4, then 40 ft x 2   Assistive device Rolling walker   Gait Pattern Step-through pattern;Decreased step length - right;Decreased step length - left;Decreased dorsiflexion - right;Decreased dorsiflexion - left;Right flexed knee in stance;Left flexed knee in stance;Wide base of support;Trunk flexed  Able to briefly improve gait pattern with cues   Ambulation Surface Level;Indoor   Gait Comments Pt initiates gait pattern with bilateral knees flexed, decreased dorsiflexion with gait.  Able to improve with cues fro 40-60 ft distances.     Knee/Hip Exercises: Stretches   Gastroc Stretch Right;Left;3 reps;20 seconds  Standing propped on 2" step   Other Knee/Hip Stretches Standing runner's stretch for gastroc stretch, 3 reps x 20 seconds, UE support     Knee/Hip  Exercises: Aerobic   Other Aerobic SciFit, Level 1.5, lower extremities only, x 5 minutes for leg strengthening     Knee/Hip Exercises: Standing   Forward Step Up Right;Left;1 set;10 reps;Hand Hold: 2;Step Height: 6"   Forward Step Up Limitations Cues to lessen UE support and not to "pull" with UEs             Balance Exercises - 04/01/17 1427      Balance Exercises: Standing   SLS Eyes open;Solid surface;Upper extremity support 1;3 reps;10 secs   SLS with Vectors Upper extremity assist 2;Solid surface  Alternating step taps at 6", 12" step x 15 reps   Stepping Strategy  Anterior;Lateral;UE support;Other reps (comment)  12 reps each, cues for foot clearance, weightshifting   Partial Tandem Stance Eyes open;Upper extremity support 1;3 reps;30 secs   Other Standing Exercises Stagger stance forward and back weigthshfiting with focus on increased ankle dorsiflexion     Marching in place x 10 reps at counter, then sidestepping along counter, 2 reps x 10 ft each direction.  Pt prefers bilateral UE support for standing balance activities.        PT Short Term Goals - 03/24/17 1533      PT SHORT TERM GOAL #1   Title Pt will be independent with HEP for imrpoved strength, balance, and gait.  TARGET 04/11/17   Time 4   Period Weeks   Status On-going     PT SHORT TERM GOAL #2   Title Pt will perform sit<>stand transfers, at least 4 of 5 trials, with minimal UE support, modified independently for improved transfer efficiency and safety.   Time 4   Period Weeks   Status On-going     PT SHORT TERM GOAL #3   Title Pt will improve TUG score to less than or equal to 19 seconds for decreased fall risk.   Time 4   Period Weeks   Status On-going     PT SHORT TERM GOAL #4   Title Pt will improve Berg Balance score to at least 27/56 for decreased fall risk.   Time 4   Period Weeks   Status On-going           PT Long Term Goals - 03/15/17 0903      PT LONG TERM GOAL #1   Title Pt/caregiver Andreas Blower), will verbalize understanding of fall prevention within the home environmen.t  TARGET 05/11/17   Time 8   Period Weeks   Status New     PT LONG TERM GOAL #2   Title Pt will improve TUG score to less than or equal to 16 seconds for decreased fall risk.   Time 8   Period Weeks   Status New     PT LONG TERM GOAL #3   Title Pt will improve Berg BAlance score to at least 32/56 for decreased fall risk.     Time 8   Period Weeks   Status New     PT LONG TERM GOAL #4   Title Pt will improve gait velocity to at least 2.4 ft/sec for improved gait efficiency  and safety with household and community gait.     Time 8   Period Weeks   Status New     PT LONG TERM GOAL #5   Title Pt/caregiver will verbalize plans for continued fitness post D/C from PT.   Time 8   Period Weeks   Status New  Plan - 04/01/17 1431    Clinical Impression Statement Session focused today on strengthening/stretching of lower extremities, balance and gait training, focusing on mechanics of gait pattern to improve heelstrike, knee extension in stance and improved push off during gait.  Pt able to demo improved gait technique for the 40-60 ft distances in therapy; however, pt's tendency is to initiate gait with flexed knees and foot flat until cues are provided.  Pt will continue to benefit from skilled PT to address strength, balance, and gait.   Rehab Potential Good   PT Frequency 2x / week   PT Duration 8 weeks  plus eval 03/12/17   PT Treatment/Interventions ADLs/Self Care Home Management;Functional mobility training;Gait training;DME Instruction;Therapeutic activities;Therapeutic exercise;Balance training;Neuromuscular re-education;Patient/family education;Orthotic Fit/Training   PT Next Visit Plan Gastroc stretching (try to add to HEP); balance and functional strengthening; gait training focus on heelstrike, step length, knee extension in stance phase  Check STGs week of 04/05/17   Consulted and Agree with Plan of Care Patient;Other (Comment)   Family Member Consulted Tri-morning caregiver      Patient will benefit from skilled therapeutic intervention in order to improve the following deficits and impairments:  Abnormal gait, Decreased balance, Decreased mobility, Decreased safety awareness, Decreased range of motion, Decreased strength, Impaired flexibility, Postural dysfunction  Visit Diagnosis: Muscle weakness (generalized)  Unsteadiness on feet  Other abnormalities of gait and mobility     Problem List Patient Active Problem List    Diagnosis Date Noted  . Other osteoporosis with current pathological fracture   . Chronic midline low back pain without sciatica   . Thrombus   . Closed nondisplaced fracture of pelvis with routine healing   . Pelvic ring fracture (HCC)   . Osteoporosis with current pathological fracture   . Slow transit constipation   . Fractures   . Pain   . Generalized anxiety disorder   . Benign essential HTN   . Hypoalbuminemia due to protein-calorie malnutrition (HCC)   . Thrombocytopenia (HCC)   . Pulmonary hypertension (HCC)   . Age-related osteoporosis with current pathological fracture   . Pelvic fracture (HCC) 12/26/2016  . Multiple closed fractures of pelvis with stable disruption of pelvic circle (HCC)   . Memory loss   . Lumbar burst fracture (HCC) 06/08/2016  . Lumbar compression fracture (HCC) 06/08/2016  . Bilateral foot-drop   . Midline thoracic back pain   . A-fib (HCC)   . Depression   . Constipation due to pain medication   . Chronic constipation   . Compression fracture of L2 (HCC) 05/30/2016  . Elevated blood pressure 05/30/2016  . L2 vertebral fracture (HCC) 05/30/2016  . Anxiety state 12/10/2014  . Neurogenic bladder 11/30/2014  . Neurogenic bowel 11/30/2014  . Bacterial UTI 11/30/2014  . Thoracic myelopathy 11/29/2014  . Persistent atrial fibrillation (HCC) 11/27/2014  . Traumatic spinal subdural hematoma   . Acute pulmonary embolism (HCC)   . Orthostatic hypotension 11/22/2014  . Near syncope 11/21/2014  . Paraplegia at T9 level (HCC) 11/17/2014  . Weakness of both legs   . Nocturnal leg cramps 07/16/2014  . Encounter for therapeutic drug monitoring 12/25/2013  . Malaise and fatigue 06/01/2011  . Atrial flutter (HCC) 03/10/2011  . Long term (current) use of anticoagulants 03/10/2011  . Benign hypertensive heart disease without heart failure 03/10/2011  . HLD (hyperlipidemia) 03/10/2011  . Paroxysmal atrial flutter (HCC)   . Palpitations   . PAC (premature  atrial contraction)   . PVC's (premature ventricular contractions)   .  Malaise   . Fatigue   . Myalgia     Ronniesha Seibold W. 04/01/2017, 2:36 PM  Gean MaidensMARRIOTT,Elinda Bunten W., PT  [facility] [address] [place], [place], [address] Phone: [phone]   Fax:  [phone]  Name: [name] Weber MRN: [id number] Date of Birth: 12/03/1930

## 2017-04-06 ENCOUNTER — Ambulatory Visit: Payer: Medicare Other | Admitting: Neurology

## 2017-04-07 ENCOUNTER — Ambulatory Visit: Payer: Medicare Other | Admitting: Physical Therapy

## 2017-04-09 ENCOUNTER — Ambulatory Visit: Payer: Medicare Other | Admitting: Physical Therapy

## 2017-04-13 ENCOUNTER — Ambulatory Visit: Payer: Medicare Other | Admitting: Physical Therapy

## 2017-04-13 ENCOUNTER — Telehealth: Payer: Self-pay | Admitting: Physical Therapy

## 2017-04-13 NOTE — Telephone Encounter (Signed)
Called and spoke with Via, to ask about pt missing 1:15 appointment this afternoon.  She reports she called earlier today and requested to cancel PT appt due to patient having brochitis and Via having pneumonia.  Reminded of upcoming appointment on Thursday, which she is aware.

## 2017-04-15 ENCOUNTER — Ambulatory Visit: Payer: Medicare Other | Admitting: Physical Therapy

## 2017-04-20 ENCOUNTER — Encounter: Payer: Self-pay | Admitting: Physical Therapy

## 2017-04-20 ENCOUNTER — Ambulatory Visit: Payer: Medicare Other | Admitting: Physical Therapy

## 2017-04-20 DIAGNOSIS — R2681 Unsteadiness on feet: Secondary | ICD-10-CM

## 2017-04-20 DIAGNOSIS — M6281 Muscle weakness (generalized): Secondary | ICD-10-CM

## 2017-04-20 DIAGNOSIS — R2689 Other abnormalities of gait and mobility: Secondary | ICD-10-CM

## 2017-04-20 NOTE — Therapy (Signed)
[facility] [address] [place], [place], [address] Phone: [phone]   Fax:  [phone]  Physical Therapy Treatment  Patient Details  Name: [name] Weber MRN: [id number] Date of Birth: [birth year] Referring Provider: Riley Kill  Encounter Date: 04/20/2017      PT End of Session - 04/20/17 2016    Visit Number 7  New episode of care 03/12/17   Number of Visits 17   Date for PT Re-Evaluation 05/11/17   Authorization Type [employer]HC Medicare-GCODE every 10th visit   PT Start Time 1325  session started late   PT Stop Time 1402   PT Time Calculation (min) 37 min   Equipment Utilized During Treatment Gait belt   Activity Tolerance Patient tolerated treatment well   Behavior During Therapy [facility] for tasks assessed/performed      Past Medical History:  Diagnosis Date  . Fatigue   . Malaise   . Myalgia   . PAC (premature atrial contraction)    ISOLATED  . Palpitations    OCCASSIONAL  . Paroxysmal atrial flutter (HCC)   . PVC's (premature ventricular contractions)    ISOLATED    Past Surgical History:  Procedure Laterality Date  . THORACIC LAMINECTOMY FOR EPIDURAL ABSCESS N/A 11/16/2014   Procedure: THORACIC LAMINECTOMY FOR EPIDURAL ABSCESS;  Surgeon: Karn Cassis, MD;  Location: [facility];  Service: Neurosurgery;  Laterality: N/A;  . VULVAR LESION REMOVAL  01/01/2009   She had a 1-cm area of erythema to the right of the urethral meatus    There were no vitals filed for this visit.      Subjective Assessment - 04/20/17 1337    Subjective Has been out sick with bronchitis. Denies falls while away.   Patient is accompained by: Family member  Friend, Tri   Pertinent History R pelvic fracture 12/25/16, T9 paraplegia, history of compression fractures; unable to wear AFOs currently due to hammer toe deformity L >R foot; blister on R heel (healed), recent UTI   Patient Stated Goals Pt's goal is for guidance for  how to continue to get stronger.   Currently in Pain? No/denies                         [facility] Adult PT Treatment/Exercise - 04/20/17 2007      Bed Mobility   Bed Mobility Supine to Sit;Sit to Supine   Supine to Sit 6: Modified independent (Device/Increase time)   Sit to Supine 6: Modified independent (Device/Increase time)     Transfers   Transfers Sit to Stand;Stand to Sit   Sit to Stand 4: Min guard;With upper extremity assist;From bed   Stand to Sit 4: Min assist;Without upper extremity assist   Stand to Sit Details assist for ant wt-xhift and control descent without UEs   Number of Reps 10 reps  left hand on thigh, rt on mat table to stand     Ambulation/Gait   Ambulation/Gait Yes   Ambulation/Gait Assistance 4: Min guard   Ambulation/Gait Assistance Details vc for heel strike and proximity to Rolator   Ambulation Distance (Feet) 120 Feet  40 x 2   Assistive device Rolling walker   Gait Pattern Step-through pattern;Decreased step length - right;Decreased step length - left;Decreased dorsiflexion - right;Decreased dorsiflexion - left;Right flexed knee in stance;Left flexed knee in stance;Wide base of support;Trunk flexed   Ambulation Surface Level;Indoor     Knee/Hip Exercises: Community education officer  Both;1 rep;60 seconds   Passive Hamstring Stretch Limitations supine   Gastroc Stretch Both;2 reps;20 seconds   Gastroc Stretch Limitations at counter     Knee/Hip Exercises: Standing   Hip Flexion Stengthening;Both;1 set;10 reps;Knee bent  UE support; tapping cones with ffeet   Hip Abduction Stengthening;Both;2 sets;10 reps;Knee straight  no band; yellow band     Knee/Hip Exercises: Seated   Marching AROM;Both;1 set  touching top of cones with feet     Ankle Exercises: Seated   Ankle Circles/Pumps AROM;Both;5 reps   Toe Raise 15 reps   BAPS Sitting;10 reps  touch edge to floor ant-post; rt-lt                  PT Short Term  Goals - 04/20/17 2026      PT SHORT TERM GOAL #1   Title Pt will be independent with HEP for imrpoved strength, balance, and gait.  TARGET 04/11/17; TARGET updated to 04/30/17 due to missed sessions/illness   Time 4   Period Weeks   Status On-going     PT SHORT TERM GOAL #2   Title Pt will perform sit<>stand transfers, at least 4 of 5 trials, with minimal UE support, modified independently for improved transfer efficiency and safety.   Time 4   Period Weeks   Status On-going     PT SHORT TERM GOAL #3   Title Pt will improve TUG score to less than or equal to 19 seconds for decreased fall risk.   Time 4   Period Weeks   Status On-going     PT SHORT TERM GOAL #4   Title Pt will improve Berg Balance score to at least 27/56 for decreased fall risk.   Time 4   Period Weeks   Status On-going           PT Long Term Goals - 03/15/17 0903      PT LONG TERM GOAL #1   Title Pt/caregiver Andreas Blower), will verbalize understanding of fall prevention within the home environmen.t  TARGET 05/11/17   Time 8   Period Weeks   Status New     PT LONG TERM GOAL #2   Title Pt will improve TUG score to less than or equal to 16 seconds for decreased fall risk.   Time 8   Period Weeks   Status New     PT LONG TERM GOAL #3   Title Pt will improve Berg BAlance score to at least 32/56 for decreased fall risk.     Time 8   Period Weeks   Status New     PT LONG TERM GOAL #4   Title Pt will improve gait velocity to at least 2.4 ft/sec for improved gait efficiency and safety with household and community gait.     Time 8   Period Weeks   Status New     PT LONG TERM GOAL #5   Title Pt/caregiver will verbalize plans for continued fitness post D/C from PT.   Time 8   Period Weeks   Status New               Plan - 04/20/17 2017    Clinical Impression Statement Session focused on gait training, strengthening LEs, and stretching LEs. Patient quickly fatigued due to recent illness and has  been gone from PT for two weeks. Will push check of STGs to 04/30/17 due to missed sessions.    Rehab Potential Good   PT Frequency  2x / week   PT Duration 8 weeks  plus eval 03/12/17   PT Treatment/Interventions ADLs/Self Care Home Management;Functional mobility training;Gait training;DME Instruction;Therapeutic activities;Therapeutic exercise;Balance training;Neuromuscular re-education;Patient/family education;Orthotic Fit/Training   PT Next Visit Plan  balance and functional strengthening; gait training focus on heelstrike, step length, knee extension in stance phase   Consulted and Agree with Plan of Care Patient;Other (Comment)   Family Member Consulted Tri-morning caregiver      Patient will benefit from skilled therapeutic intervention in order to improve the following deficits and impairments:  Abnormal gait, Decreased balance, Decreased mobility, Decreased safety awareness, Decreased range of motion, Decreased strength, Impaired flexibility, Postural dysfunction  Visit Diagnosis: Muscle weakness (generalized)  Unsteadiness on feet  Other abnormalities of gait and mobility     Problem List Patient Active Problem List   Diagnosis Date Noted  . Other osteoporosis with current pathological fracture   . Chronic midline low back pain without sciatica   . Thrombus   . Closed nondisplaced fracture of pelvis with routine healing   . Pelvic ring fracture (HCC)   . Osteoporosis with current pathological fracture   . Slow transit constipation   . Fractures   . Pain   . Generalized anxiety disorder   . Benign essential HTN   . Hypoalbuminemia due to protein-calorie malnutrition (HCC)   . Thrombocytopenia (HCC)   . Pulmonary hypertension (HCC)   . Age-related osteoporosis with current pathological fracture   . Pelvic fracture (HCC) 12/26/2016  . Multiple closed fractures of pelvis with stable disruption of pelvic circle (HCC)   . Memory loss   . Lumbar burst fracture (HCC)  06/08/2016  . Lumbar compression fracture (HCC) 06/08/2016  . Bilateral foot-drop   . Midline thoracic back pain   . A-fib (HCC)   . Depression   . Constipation due to pain medication   . Chronic constipation   . Compression fracture of L2 (HCC) 05/30/2016  . Elevated blood pressure 05/30/2016  . L2 vertebral fracture (HCC) 05/30/2016  . Anxiety state 12/10/2014  . Neurogenic bladder 11/30/2014  . Neurogenic bowel 11/30/2014  . Bacterial UTI 11/30/2014  . Thoracic myelopathy 11/29/2014  . Persistent atrial fibrillation (HCC) 11/27/2014  . Traumatic spinal subdural hematoma   . Acute pulmonary embolism (HCC)   . Orthostatic hypotension 11/22/2014  . Near syncope 11/21/2014  . Paraplegia at T9 level (HCC) 11/17/2014  . Weakness of both legs   . Nocturnal leg cramps 07/16/2014  . Encounter for therapeutic drug monitoring 12/25/2013  . Malaise and fatigue 06/01/2011  . Atrial flutter (HCC) 03/10/2011  . Long term (current) use of anticoagulants 03/10/2011  . Benign hypertensive heart disease without heart failure 03/10/2011  . HLD (hyperlipidemia) 03/10/2011  . Paroxysmal atrial flutter (HCC)   . Palpitations   . PAC (premature atrial contraction)   . PVC's (premature ventricular contractions)   . Malaise   . Fatigue   . Myalgia     Zena AmosLynn P Shannel Zahm. PT 04/20/2017, 8:28 PM  [facility] [address] [place], [place], [address] Phone: [phone]   Fax:  [phone]  Name: Stephani Policevelyn C Charon MRN: [id number] Date of Birth: 09/29/1931

## 2017-04-21 ENCOUNTER — Ambulatory Visit: Payer: Medicare Other | Admitting: Physical Therapy

## 2017-04-22 ENCOUNTER — Ambulatory Visit: Payer: Medicare Other | Admitting: Physical Therapy

## 2017-04-22 DIAGNOSIS — R2689 Other abnormalities of gait and mobility: Secondary | ICD-10-CM

## 2017-04-22 DIAGNOSIS — R2681 Unsteadiness on feet: Secondary | ICD-10-CM

## 2017-04-22 DIAGNOSIS — M6281 Muscle weakness (generalized): Secondary | ICD-10-CM | POA: Diagnosis not present

## 2017-04-22 NOTE — Therapy (Signed)
[facility] [address] [place], [place], [address] Phone: [phone]   Fax:  [phone]  Physical Therapy Treatment  Patient Details  Name: [name] Weber MRN: [id number] Date of Birth: 23-Nov-1931 Referring Provider: Riley Kill  Encounter Date: 04/22/2017      PT End of Session - 04/22/17 2152    Visit Number 8  New episode of care 03/12/17   Number of Visits 17   Date for PT Re-Evaluation 05/11/17   Authorization Type UHC Medicare-GCODE every 10th visit   PT Start Time 1102   PT Stop Time 1145   PT Time Calculation (min) 43 min   Equipment Utilized During Treatment Gait belt   Activity Tolerance Patient tolerated treatment well   Behavior During Therapy [facility] for tasks assessed/performed      Past Medical History:  Diagnosis Date  . Fatigue   . Malaise   . Myalgia   . PAC (premature atrial contraction)    ISOLATED  . Palpitations    OCCASSIONAL  . Paroxysmal atrial flutter (HCC)   . PVC's (premature ventricular contractions)    ISOLATED    Past Surgical History:  Procedure Laterality Date  . THORACIC LAMINECTOMY FOR EPIDURAL ABSCESS N/A 11/16/2014   Procedure: THORACIC LAMINECTOMY FOR EPIDURAL ABSCESS;  Surgeon: Karn Cassis, MD;  Location: [facility];  Service: Neurosurgery;  Laterality: N/A;  . VULVAR LESION REMOVAL  01/01/2009   She had a 1-cm area of erythema to the right of the urethral meatus    There were no vitals filed for this visit.      Subjective Assessment - 04/22/17 1106    Subjective I had that virus that's going around.  I feel better now.  Denies any falls.   Patient is accompained by: Family member  Andreas Blower   Pertinent History R pelvic fracture 12/25/16, T9 paraplegia, history of compression fractures; unable to wear AFOs currently due to hammer toe deformity L >R foot; blister on R heel (healed), recent UTI   Patient Stated Goals Pt's goal is for guidance for how to  continue to get stronger.   Currently in Pain? No/denies                         [facility] Adult PT Treatment/Exercise - 04/22/17 0001      Transfers   Transfers Sit to Stand;Stand to Sit   Sit to Stand 4: Min guard;5: Supervision   Sit to Stand Details (indicate cue type and reason) Pt demo improved equal push-off to stand through bilateral UEs   Stand to Sit 4: Min guard;5: Supervision   Number of Reps 10 reps  from 20" mat surface     Ambulation/Gait   Ambulation/Gait Yes   Ambulation/Gait Assistance 4: Min guard   Ambulation/Gait Assistance Details Verbal cues for increased step length, for imrpoved heelstrike with gait   Ambulation Distance (Feet) 120 Feet  x 4   Assistive device Rolling walker   Gait Pattern Step-through pattern;Decreased step length - right;Decreased step length - left;Decreased dorsiflexion - right;Decreased dorsiflexion - left;Right flexed knee in stance;Left flexed knee in stance;Wide base of support;Trunk flexed   Ambulation Surface Level;Indoor   Pre-Gait Activities Pt's current RW has tennis balls and plastic stoppers that have worn through-replaced with new tennis balls for improved ease of movement.     Knee/Hip Exercises: Stretches   Active Hamstring Stretch Right;Left;3 reps;30 seconds  Balance Exercises - 04/22/17 2147      Balance Exercises: Standing   Standing Eyes Opened Wide (BOA);Narrow base of support (BOS);Head turns;5 reps  Head nods, at counter, min guard assistance   Standing Eyes Closed Wide (BOA);Narrow base of support (BOS);Solid surface;1 rep;10 secs  At counter 1-2 UE support   Standing, One Foot on a Step Eyes open;4 inch  UE alternating lifts x 10, then head turns/nods x 5   Rockerboard Anterior/posterior;UE support  Hip/ankle strategy work x 10 reps    Balance Beam marching in place x 10 reps, then forward kicks x 10 reps, then forward step taps x 10 reps with bilateral UE support    Sidestepping Upper extremity support;Other reps (comment)  Sidestep-together R and L 20 reps at counter   Heel Raises Limitations x 10 reps on foam   Toe Raise Limitations x 10 reps on foam-limited due to decr strength   Other Standing Exercises Stagger stance forward and back weigthshfiting with focus on increased ankle dorsiflexion x 15 reps each position, then lateral weightshifting x 10 reps, then minisquats x 10 reps with UE support at counter             PT Short Term Goals - 04/20/17 2026      PT SHORT TERM GOAL #1   Title Pt will be independent with HEP for imrpoved strength, balance, and gait.  TARGET 04/11/17; TARGET updated to 04/30/17 due to missed sessions/illness   Time 4   Period Weeks   Status On-going     PT SHORT TERM GOAL #2   Title Pt will perform sit<>stand transfers, at least 4 of 5 trials, with minimal UE support, modified independently for improved transfer efficiency and safety.   Time 4   Period Weeks   Status On-going     PT SHORT TERM GOAL #3   Title Pt will improve TUG score to less than or equal to 19 seconds for decreased fall risk.   Time 4   Period Weeks   Status On-going     PT SHORT TERM GOAL #4   Title Pt will improve Berg Balance score to at least 27/56 for decreased fall risk.   Time 4   Period Weeks   Status On-going           PT Long Term Goals - 03/15/17 0903      PT LONG TERM GOAL #1   Title Pt/caregiver Andreas Blower), will verbalize understanding of fall prevention within the home environmen.t  TARGET 05/11/17   Time 8   Period Weeks   Status New     PT LONG TERM GOAL #2   Title Pt will improve TUG score to less than or equal to 16 seconds for decreased fall risk.   Time 8   Period Weeks   Status New     PT LONG TERM GOAL #3   Title Pt will improve Berg BAlance score to at least 32/56 for decreased fall risk.     Time 8   Period Weeks   Status New     PT LONG TERM GOAL #4   Title Pt will improve gait velocity to at  least 2.4 ft/sec for improved gait efficiency and safety with household and community gait.     Time 8   Period Weeks   Status New     PT LONG TERM GOAL #5   Title Pt/caregiver will verbalize plans for continued fitness post D/C from PT.  Time 8   Period Weeks   Status New               Plan - 04/22/17 2152    Clinical Impression Statement Pt tolerates solid surface and compliant surface balance activities today with only 1-2 brief seated rest breaks.  She continues to need bilateral UE support for most balance activities.  Will plan to check goals next week, and likely extend POC.  Pt will continue to benefit from skilled PT to address balance, strength and overall safety/independence with gait.   Rehab Potential Good   PT Frequency 2x / week   PT Duration 8 weeks  plus eval 03/12/17   PT Treatment/Interventions ADLs/Self Care Home Management;Functional mobility training;Gait training;DME Instruction;Therapeutic activities;Therapeutic exercise;Balance training;Neuromuscular re-education;Patient/family education;Orthotic Fit/Training   PT Next Visit Plan  balance and functional strengthening; gait training focus on heelstrike, step length, knee extension in stance phase; lower extremity stretching   Consulted and Agree with Plan of Care Patient;Other (Comment)   Family Member Consulted Vie-friend      Patient will benefit from skilled therapeutic intervention in order to improve the following deficits and impairments:  Abnormal gait, Decreased balance, Decreased mobility, Decreased safety awareness, Decreased range of motion, Decreased strength, Impaired flexibility, Postural dysfunction  Visit Diagnosis: Unsteadiness on feet  Other abnormalities of gait and mobility     Problem List Patient Active Problem List   Diagnosis Date Noted  . Other osteoporosis with current pathological fracture   . Chronic midline low back pain without sciatica   . Thrombus   . Closed  nondisplaced fracture of pelvis with routine healing   . Pelvic ring fracture (HCC)   . Osteoporosis with current pathological fracture   . Slow transit constipation   . Fractures   . Pain   . Generalized anxiety disorder   . Benign essential HTN   . Hypoalbuminemia due to protein-calorie malnutrition (HCC)   . Thrombocytopenia (HCC)   . Pulmonary hypertension (HCC)   . Age-related osteoporosis with current pathological fracture   . Pelvic fracture (HCC) 12/26/2016  . Multiple closed fractures of pelvis with stable disruption of pelvic circle (HCC)   . Memory loss   . Lumbar burst fracture (HCC) 06/08/2016  . Lumbar compression fracture (HCC) 06/08/2016  . Bilateral foot-drop   . Midline thoracic back pain   . A-fib (HCC)   . Depression   . Constipation due to pain medication   . Chronic constipation   . Compression fracture of L2 (HCC) 05/30/2016  . Elevated blood pressure 05/30/2016  . L2 vertebral fracture (HCC) 05/30/2016  . Anxiety state 12/10/2014  . Neurogenic bladder 11/30/2014  . Neurogenic bowel 11/30/2014  . Bacterial UTI 11/30/2014  . Thoracic myelopathy 11/29/2014  . Persistent atrial fibrillation (HCC) 11/27/2014  . Traumatic spinal subdural hematoma   . Acute pulmonary embolism (HCC)   . Orthostatic hypotension 11/22/2014  . Near syncope 11/21/2014  . Paraplegia at T9 level (HCC) 11/17/2014  . Weakness of both legs   . Nocturnal leg cramps 07/16/2014  . Encounter for therapeutic drug monitoring 12/25/2013  . Malaise and fatigue 06/01/2011  . Atrial flutter (HCC) 03/10/2011  . Long term (current) use of anticoagulants 03/10/2011  . Benign hypertensive heart disease without heart failure 03/10/2011  . HLD (hyperlipidemia) 03/10/2011  . Paroxysmal atrial flutter (HCC)   . Palpitations   . PAC (premature atrial contraction)   . PVC's (premature ventricular contractions)   . Malaise   . Fatigue   .  Myalgia     MARRIOTT,AMY W. 04/22/2017, 9:57  PM Gean MaidensMARRIOTT,AMY W., PT  [facility] [address] [place], [place], [address] Phone: [phone]   Fax:  [phone]  Name: [name] Weber MRN: [id number] Date of Birth: 05/05/1931

## 2017-04-27 ENCOUNTER — Ambulatory Visit: Payer: Medicare Other | Admitting: Physical Therapy

## 2017-04-29 ENCOUNTER — Encounter: Payer: Self-pay | Admitting: Physical Therapy

## 2017-04-29 ENCOUNTER — Ambulatory Visit: Payer: Medicare Other | Admitting: Physical Therapy

## 2017-04-29 DIAGNOSIS — R293 Abnormal posture: Secondary | ICD-10-CM

## 2017-04-29 DIAGNOSIS — M6281 Muscle weakness (generalized): Secondary | ICD-10-CM

## 2017-04-29 DIAGNOSIS — R2681 Unsteadiness on feet: Secondary | ICD-10-CM

## 2017-04-29 DIAGNOSIS — R2689 Other abnormalities of gait and mobility: Secondary | ICD-10-CM

## 2017-04-29 NOTE — Therapy (Signed)
[facility] [address] [place], [place], [address] Phone: [phone]   Fax:  [phone]  Physical Therapy Treatment  Patient Details  Name: [name] Weber MRN: [id number] Date of Birth: 01/16/1931 Referring Provider: Riley Kill  Encounter Date: 04/29/2017      PT End of Session - 04/29/17 1159    Visit Number 9  New episode of care 03/12/17   Number of Visits 17   Date for PT Re-Evaluation 05/11/17   Authorization Type UHC Medicare-GCODE every 10th visit   PT Start Time 1100   PT Stop Time 1147   PT Time Calculation (min) 47 min   Activity Tolerance Patient tolerated treatment well   Behavior During Therapy [facility] for tasks assessed/performed      Past Medical History:  Diagnosis Date  . Fatigue   . Malaise   . Myalgia   . PAC (premature atrial contraction)    ISOLATED  . Palpitations    OCCASSIONAL  . Paroxysmal atrial flutter (HCC)   . PVC's (premature ventricular contractions)    ISOLATED    Past Surgical History:  Procedure Laterality Date  . THORACIC LAMINECTOMY FOR EPIDURAL ABSCESS N/A 11/16/2014   Procedure: THORACIC LAMINECTOMY FOR EPIDURAL ABSCESS;  Surgeon: Karn Cassis, MD;  Location: [facility];  Service: Neurosurgery;  Laterality: N/A;  . VULVAR LESION REMOVAL  01/01/2009   She had a 1-cm area of erythema to the right of the urethral meatus    There were no vitals filed for this visit.      Subjective Assessment - 04/29/17 1105    Subjective Pt feels better, no issues from virus.  No falls or LOB at home.   Patient is accompained by: Family member   Pertinent History R pelvic fracture 12/25/16, T9 paraplegia, history of compression fractures; unable to wear AFOs currently due to hammer toe deformity L >R foot; blister on R heel (healed), recent UTI   Patient Stated Goals Pt's goal is for guidance for how to continue to get stronger.   Currently in Pain? No/denies                          [facility] Adult PT Treatment/Exercise - 04/29/17 1128      Knee/Hip Exercises: Aerobic   Tread Mill 1.2 mph x 6:30 minutes with verbal, visual and tactile cues for full stance time, step length bilaterally with cues for knee extension during swing phase and heel strike to increase step length.  Also performed 1:30 retro gait on treadmill at 0.4 mph to focus on hip extension with min A     Knee/Hip Exercises: Standing   Forward Step Up Right;Left;Hand Hold: 2;Step Height: 4"   Forward Step Up Limitations forwards/retro over balance beam x 12 reps each LE; then placed balance beam parallel to contralateral foot and performed stepping forwards and back for increased lateral weight shifting             Balance Exercises - 04/29/17 1143      Balance Exercises: Standing   SLS Eyes open;Solid surface;Upper extremity support 1;5 reps  on balance beam, contralateral LE tapping forwards, back x 5   Tandem Gait Forward;Upper extremity support;4 reps             PT Short Term Goals - 04/20/17 2026      PT SHORT TERM GOAL #1   Title Pt will be independent with HEP for imrpoved strength, balance,  and gait.  TARGET 04/11/17; TARGET updated to 04/30/17 due to missed sessions/illness   Time 4   Period Weeks   Status On-going     PT SHORT TERM GOAL #2   Title Pt will perform sit<>stand transfers, at least 4 of 5 trials, with minimal UE support, modified independently for improved transfer efficiency and safety.   Time 4   Period Weeks   Status On-going     PT SHORT TERM GOAL #3   Title Pt will improve TUG score to less than or equal to 19 seconds for decreased fall risk.   Time 4   Period Weeks   Status On-going     PT SHORT TERM GOAL #4   Title Pt will improve Berg Balance score to at least 27/56 for decreased fall risk.   Time 4   Period Weeks   Status On-going           PT Long Term Goals - 03/15/17 0903      PT LONG TERM GOAL #1    Title Pt/caregiver Andreas Blower([name]), will verbalize understanding of fall prevention within the home environmen.t  TARGET 05/11/17   Time 8   Period Weeks   Status New     PT LONG TERM GOAL #2   Title Pt will improve TUG score to less than or equal to 16 seconds for decreased fall risk.   Time 8   Period Weeks   Status New     PT LONG TERM GOAL #3   Title Pt will improve Berg BAlance score to at least 32/56 for decreased fall risk.     Time 8   Period Weeks   Status New     PT LONG TERM GOAL #4   Title Pt will improve gait velocity to at least 2.4 ft/sec for improved gait efficiency and safety with household and community gait.     Time 8   Period Weeks   Status New     PT LONG TERM GOAL #5   Title Pt/caregiver will verbalize plans for continued fitness post D/C from PT.   Time 8   Period Weeks   Status New               Plan - 04/29/17 1159    Clinical Impression Statement Continued higher level gait training on treadmill for more continuous stepping sequence forwards and backwards gait, step ups and step overs to facilitate increased knee extension and full step length and stance time and tandem gait to continue to facilitate full step length and heel strike.  Pt required 2-3 seated rest breaks in standing and sitting due to UE and LE weakness.   Rehab Potential Good   PT Frequency 2x / week   PT Duration 8 weeks   PT Treatment/Interventions ADLs/Self Care Home Management;Functional mobility training;Gait training;DME Instruction;Therapeutic activities;Therapeutic exercise;Balance training;Neuromuscular re-education;Patient/family education;Orthotic Fit/Training   PT Next Visit Plan 10th visit G code and progress note due-re-assess BERG; continue to focus on gait training with knee extension in swing and stance phase, full hip extension-retro gait, tandem gait, hip flexor stretching, decreasing UE dependence   Consulted and Agree with Plan of Care Patient      Patient  will benefit from skilled therapeutic intervention in order to improve the following deficits and impairments:  Abnormal gait, Decreased balance, Decreased mobility, Decreased safety awareness, Decreased range of motion, Decreased strength, Impaired flexibility, Postural dysfunction  Visit Diagnosis: Other abnormalities of gait and mobility  Muscle weakness (  generalized)  Abnormal posture  Unsteadiness on feet     Problem List Patient Active Problem List   Diagnosis Date Noted  . Other osteoporosis with current pathological fracture   . Chronic midline low back pain without sciatica   . Thrombus   . Closed nondisplaced fracture of pelvis with routine healing   . Pelvic ring fracture (HCC)   . Osteoporosis with current pathological fracture   . Slow transit constipation   . Fractures   . Pain   . Generalized anxiety disorder   . Benign essential HTN   . Hypoalbuminemia due to protein-calorie malnutrition (HCC)   . Thrombocytopenia (HCC)   . Pulmonary hypertension (HCC)   . Age-related osteoporosis with current pathological fracture   . Pelvic fracture (HCC) 12/26/2016  . Multiple closed fractures of pelvis with stable disruption of pelvic circle (HCC)   . Memory loss   . Lumbar burst fracture (HCC) 06/08/2016  . Lumbar compression fracture (HCC) 06/08/2016  . Bilateral foot-drop   . Midline thoracic back pain   . A-fib (HCC)   . Depression   . Constipation due to pain medication   . Chronic constipation   . Compression fracture of L2 (HCC) 05/30/2016  . Elevated blood pressure 05/30/2016  . L2 vertebral fracture (HCC) 05/30/2016  . Anxiety state 12/10/2014  . Neurogenic bladder 11/30/2014  . Neurogenic bowel 11/30/2014  . Bacterial UTI 11/30/2014  . Thoracic myelopathy 11/29/2014  . Persistent atrial fibrillation (HCC) 11/27/2014  . Traumatic spinal subdural hematoma   . Acute pulmonary embolism (HCC)   . Orthostatic hypotension 11/22/2014  . Near syncope  11/21/2014  . Paraplegia at T9 level (HCC) 11/17/2014  . Weakness of both legs   . Nocturnal leg cramps 07/16/2014  . Encounter for therapeutic drug monitoring 12/25/2013  . Malaise and fatigue 06/01/2011  . Atrial flutter (HCC) 03/10/2011  . Long term (current) use of anticoagulants 03/10/2011  . Benign hypertensive heart disease without heart failure 03/10/2011  . HLD (hyperlipidemia) 03/10/2011  . Paroxysmal atrial flutter (HCC)   . Palpitations   . PAC (premature atrial contraction)   . PVC's (premature ventricular contractions)   . Malaise   . Fatigue   . Myalgia     Edman Circle, PT, DPT 04/29/17    12:09 PM    [facility] [address] [place], [place], [address] Phone: [phone]   Fax:  [phone]  Name: [name] Weber MRN: [id number] Date of Birth: 07-15-1931

## 2017-05-10 ENCOUNTER — Ambulatory Visit: Payer: Medicare Other | Admitting: Physical Medicine & Rehabilitation

## 2017-05-13 ENCOUNTER — Ambulatory Visit: Payer: Medicare Other | Admitting: Physical Therapy

## 2017-05-14 ENCOUNTER — Encounter: Payer: Self-pay | Admitting: Physical Therapy

## 2017-05-14 ENCOUNTER — Ambulatory Visit: Payer: Medicare Other | Attending: Physical Medicine & Rehabilitation | Admitting: Physical Therapy

## 2017-05-14 DIAGNOSIS — R2681 Unsteadiness on feet: Secondary | ICD-10-CM | POA: Insufficient documentation

## 2017-05-14 DIAGNOSIS — R293 Abnormal posture: Secondary | ICD-10-CM | POA: Diagnosis present

## 2017-05-14 DIAGNOSIS — M6281 Muscle weakness (generalized): Secondary | ICD-10-CM

## 2017-05-14 DIAGNOSIS — R269 Unspecified abnormalities of gait and mobility: Secondary | ICD-10-CM

## 2017-05-14 DIAGNOSIS — R2689 Other abnormalities of gait and mobility: Secondary | ICD-10-CM | POA: Diagnosis not present

## 2017-05-14 NOTE — Therapy (Signed)
[facility] [address] [place], [place], [address] Phone: [phone]   Fax:  [phone]  Physical Therapy Treatment  Patient Details  Name: [name] Weber MRN: [id number] Date of Birth: 08-May-1931 Referring Provider: Riley Kill  Encounter Date: 05/14/2017      PT End of Session - 05/14/17 2228    Visit Number 10  New episode of care 03/12/17   Number of Visits 33  recert 6/15   Date for PT Re-Evaluation 05/11/17   Authorization Type UHC Medicare-GCODE every 10th visit   PT Start Time 1447   PT Stop Time 1530   PT Time Calculation (min) 43 min   Activity Tolerance Patient tolerated treatment well   Behavior During Therapy [facility] for tasks assessed/performed      Past Medical History:  Diagnosis Date  . Fatigue   . Malaise   . Myalgia   . PAC (premature atrial contraction)    ISOLATED  . Palpitations    OCCASSIONAL  . Paroxysmal atrial flutter (HCC)   . PVC's (premature ventricular contractions)    ISOLATED    Past Surgical History:  Procedure Laterality Date  . THORACIC LAMINECTOMY FOR EPIDURAL ABSCESS N/A 11/16/2014   Procedure: THORACIC LAMINECTOMY FOR EPIDURAL ABSCESS;  Surgeon: Karn Cassis, MD;  Location: [facility];  Service: Neurosurgery;  Laterality: N/A;  . VULVAR LESION REMOVAL  01/01/2009   She had a 1-cm area of erythema to the right of the urethral meatus    There were no vitals filed for this visit.      Subjective Assessment - 05/14/17 1449    Subjective Couldn't get scheduled and had conflicts with her schedule also. No falls.    Patient is accompained by: Family member   Pertinent History R pelvic fracture 12/25/16, T9 paraplegia, history of compression fractures; unable to wear AFOs currently due to hammer toe deformity L >R foot; blister on R heel (healed), recent UTI   Patient Stated Goals Pt's goal is for guidance for how to continue to get stronger.   Currently in Pain?  No/denies                         [facility] Adult PT Treatment/Exercise - 05/14/17 0001      Bed Mobility   Bed Mobility Rolling Right;Rolling Left;Sit to Supine;Supine to Sit   Rolling Right 6: Modified independent (Device/Increase time)   Rolling Left 6: Modified independent (Device/Increase time)   Supine to Sit 6: Modified independent (Device/Increase time)   Sit to Supine 6: Modified independent (Device/Increase time)     Transfers   Transfers Sit to Stand;Stand to Sit   Sit to Stand 6: Modified independent (Device/Increase time);4: Min guard   Sit to Stand Details (indicate cue type and reason) heavy reliance on UEs is modified independent; lighter use of UEs (including one hand on knee and one on armrest) minguard assist   Stand to Sit 6: Modified independent (Device/Increase time);4: Min guard   Stand to Sit Details heavy reliance on UEs is modified independent; lighter use of UEs (including one hand on knee and one on armrest) minguard assist   Number of Reps 2 sets  5 reps   Transfer Cueing forward wt-shift--pt fearful and leans back on her hands     Ambulation/Gait   Ambulation/Gait Assistance 4: Min guard   Ambulation/Gait Assistance Details vc for incr heelstrike (to assist with foot clearance) and proximity to RW  Ambulation Distance (Feet) 120 Feet  120, 50   Assistive device Rollator   Gait Pattern Step-through pattern;Decreased step length - right;Decreased step length - left;Decreased dorsiflexion - right;Decreased dorsiflexion - left;Right flexed knee in stance;Left flexed knee in stance;Wide base of support;Trunk flexed   Ambulation Surface Level;Indoor     Berg Balance Test   Sit to Stand Able to stand  independently using hands   Standing Unsupported Able to stand safely 2 minutes   Sitting with Back Unsupported but Feet Supported on Floor or Stool Able to sit safely and securely 2 minutes   Stand to Sit Controls descent by using hands    Transfers Needs one person to assist   Standing Unsupported with Eyes Closed Able to stand 10 seconds safely   Standing Ubsupported with Feet Together Needs help to attain position and unable to hold for 15 seconds   From Standing, Reach Forward with Outstretched Arm Reaches forward but needs supervision   From Standing Position, Pick up Object from Floor Unable to try/needs assist to keep balance   From Standing Position, Turn to Look Behind Over each Shoulder Looks behind one side only/other side shows less weight shift   Turn 360 Degrees Needs assistance while turning   Standing Unsupported, Alternately Place Feet on Step/Stool Needs assistance to keep from falling or unable to try   Standing Unsupported, One Foot in [employer]gate PalmoliveFront Loses balance while stepping or standing   Standing on One Leg Unable to try or needs assist to prevent fall   Total Score 23     Timed Up and Go Test   Normal TUG (seconds) 16.68             Balance Exercises - 05/14/17 2225      Balance Exercises: Standing   Rockerboard Anterior/posterior;Lateral;Head turns;EO;10 reps   Balance Beam black beam horizontal; step off/on bil LEs ant/posterior           PT Education - 05/14/17 2228    Education provided Yes   Education Details results of STG check: PT POC   Person(s) Educated Patient;Caregiver(s)   Methods Explanation;Demonstration;Tactile cues;Verbal cues;Handout   Comprehension Verbalized understanding;Need further instruction          PT Short Term Goals - 05/14/17 1500      PT SHORT TERM GOAL #1   Title Pt will be independent with HEP for imrpoved strength, balance, and gait.  TARGET 04/11/17; TARGET updated to 04/30/17 due to missed sessions/illness: TARGET for all STGs 06/11/17   Time 4   Period Weeks   Status On-going     PT SHORT TERM GOAL #2   Title Pt will perform sit<>stand transfers, at least 4 of 5 trials, with minimal UE support, modified independently for improved transfer efficiency  and safety.   Baseline 6/15 required mod assist of UEs   Time 4   Period Weeks   Status On-going     PT SHORT TERM GOAL #3   Title Pt will improve TUG score to less than or equal to 19 seconds for decreased fall risk.   Baseline 6/15 16.68   Time 4   Period Weeks   Status Achieved     PT SHORT TERM GOAL #4   Title Pt will improve Berg Balance score to at least 27/56 for decreased fall risk.   Baseline 6/15 23/56   Time 4   Period Weeks   Status On-going     PT SHORT TERM GOAL #5   Title ---  Baseline ---           PT Long Term Goals - 05/21/2017 2249      PT LONG TERM GOAL #1   Title Pt/caregiver ([name]), will verbalize understanding of fall prevention within the home environmen.t  TARGET 05/11/17; TARGET for all LTGs 07/09/17   Time 8   Period Weeks   Status On-going     PT LONG TERM GOAL #2   Title Pt will improve TUG score to less than or equal to 16 seconds for decreased fall risk. 05/22/23 updated to <=14 sec.    Time 8   Period Weeks   Status On-going     PT LONG TERM GOAL #3   Title Pt will improve Berg BAlance score to at least 32/56 for decreased fall risk.  05/21/17 updated to 30/56   Time 8   Period Weeks   Status On-going     PT LONG TERM GOAL #4   Title Pt will improve gait velocity to at least 2.4 ft/sec for improved gait efficiency and safety with household and community gait.     Time 8   Period Weeks   Status On-going     PT LONG TERM GOAL #5   Title Pt/caregiver will verbalize plans for continued fitness post D/C from PT.   Time 8   Period Weeks   Status On-going               Plan - 05/21/17 2231    Clinical Impression Statement Patient returns to PT after ~2 week gap in appointments. Session focused on checking STGs and discussion of PT POC moving forward. Patient is motivated to improve her balance and safety as she plans to go to [place] once her balance has improved enough.    Rehab Potential Good   PT Frequency 2x / week    PT Duration 8 weeks   PT Treatment/Interventions ADLs/Self Care Home Management;Functional mobility training;Gait training;DME Instruction;Therapeutic activities;Therapeutic exercise;Balance training;Neuromuscular re-education;Patient/family education;Orthotic Fit/Training   PT Next Visit Plan continue to focus on gait training with knee extension in swing and stance phase, full hip extension-retro gait, tandem gait, hip flexor stretching, decreasing UE dependence with transfers   Consulted and Agree with Plan of Care Patient      Patient will benefit from skilled therapeutic intervention in order to improve the following deficits and impairments:  Abnormal gait, Decreased balance, Decreased mobility, Decreased safety awareness, Decreased range of motion, Decreased strength, Impaired flexibility, Postural dysfunction  Visit Diagnosis: Other abnormalities of gait and mobility - Plan: PT plan of care cert/re-cert  Muscle weakness (generalized) - Plan: PT plan of care cert/re-cert  Unsteadiness on feet - Plan: PT plan of care cert/re-cert  Abnormality of gait - Plan: PT plan of care cert/re-cert       G-Codes - 05/21/17 2240    Functional Assessment Tool Used (Outpatient Only) TUG 16.68 sec, Berg 23/56,    Functional Limitation Mobility: Walking and moving around   Mobility: Walking and Moving Around Current Status [phone]) At least 40 percent but less than 60 percent impaired, limited or restricted   Mobility: Walking and Moving Around Goal Status [phone]) At least 20 percent but less than 40 percent impaired, limited or restricted      Problem List Patient Active Problem List   Diagnosis Date Noted  . Other osteoporosis with current pathological fracture   . Chronic midline low back pain without sciatica   . Thrombus   . Closed nondisplaced fracture  of pelvis with routine healing   . Pelvic ring fracture (HCC)   . Osteoporosis with current pathological fracture   . Slow transit  constipation   . Fractures   . Pain   . Generalized anxiety disorder   . Benign essential HTN   . Hypoalbuminemia due to protein-calorie malnutrition (HCC)   . Thrombocytopenia (HCC)   . Pulmonary hypertension (HCC)   . Age-related osteoporosis with current pathological fracture   . Pelvic fracture (HCC) 12/26/2016  . Multiple closed fractures of pelvis with stable disruption of pelvic circle (HCC)   . Memory loss   . Lumbar burst fracture (HCC) 06/08/2016  . Lumbar compression fracture (HCC) 06/08/2016  . Bilateral foot-drop   . Midline thoracic back pain   . A-fib (HCC)   . Depression   . Constipation due to pain medication   . Chronic constipation   . Compression fracture of L2 (HCC) 05/30/2016  . Elevated blood pressure 05/30/2016  . L2 vertebral fracture (HCC) 05/30/2016  . Anxiety state 12/10/2014  . Neurogenic bladder 11/30/2014  . Neurogenic bowel 11/30/2014  . Bacterial UTI 11/30/2014  . Thoracic myelopathy 11/29/2014  . Persistent atrial fibrillation (HCC) 11/27/2014  . Traumatic spinal subdural hematoma   . Acute pulmonary embolism (HCC)   . Orthostatic hypotension 11/22/2014  . Near syncope 11/21/2014  . Paraplegia at T9 level (HCC) 11/17/2014  . Weakness of both legs   . Nocturnal leg cramps 07/16/2014  . Encounter for therapeutic drug monitoring 12/25/2013  . Malaise and fatigue 06/01/2011  . Atrial flutter (HCC) 03/10/2011  . Long term (current) use of anticoagulants 03/10/2011  . Benign hypertensive heart disease without heart failure 03/10/2011  . HLD (hyperlipidemia) 03/10/2011  . Paroxysmal atrial flutter (HCC)   . Palpitations   . PAC (premature atrial contraction)   . PVC's (premature ventricular contractions)   . Malaise   . Fatigue   . Myalgia    Physical Therapy Progress Note  Dates of Reporting Period: 03/12/17 to 05/14/17  Objective Reports of Subjective Statement:   Objective Measurements: TUG 16.68 sec; Berg 23/56  Goal Update: see  above  Plan: recertify for additional 8 weeks  Reason Skilled Services are Required: Continued higher risk for a fall.   [employer]. PT 05/14/2017, 10:58 PM  [facility] [address] [place], [place], [address] Phone: [phone]   Fax:  [phone]  Name: [name] Weber MRN: [id number] Date of Birth: [birth year]

## 2017-05-17 ENCOUNTER — Encounter: Payer: Self-pay | Admitting: Physical Medicine & Rehabilitation

## 2017-05-17 ENCOUNTER — Ambulatory Visit: Payer: Medicare Other | Admitting: Physical Therapy

## 2017-05-17 ENCOUNTER — Encounter: Payer: Self-pay | Admitting: Physical Therapy

## 2017-05-17 ENCOUNTER — Encounter: Payer: Medicare Other | Attending: Physical Medicine & Rehabilitation | Admitting: Physical Medicine & Rehabilitation

## 2017-05-17 VITALS — BP 124/73 | HR 79 | Resp 14

## 2017-05-17 DIAGNOSIS — I493 Ventricular premature depolarization: Secondary | ICD-10-CM | POA: Diagnosis not present

## 2017-05-17 DIAGNOSIS — R2681 Unsteadiness on feet: Secondary | ICD-10-CM

## 2017-05-17 DIAGNOSIS — I491 Atrial premature depolarization: Secondary | ICD-10-CM | POA: Insufficient documentation

## 2017-05-17 DIAGNOSIS — S32810S Multiple fractures of pelvis with stable disruption of pelvic ring, sequela: Secondary | ICD-10-CM | POA: Diagnosis not present

## 2017-05-17 DIAGNOSIS — R2689 Other abnormalities of gait and mobility: Secondary | ICD-10-CM | POA: Diagnosis not present

## 2017-05-17 DIAGNOSIS — M2012 Hallux valgus (acquired), left foot: Secondary | ICD-10-CM | POA: Diagnosis not present

## 2017-05-17 DIAGNOSIS — M4714 Other spondylosis with myelopathy, thoracic region: Secondary | ICD-10-CM | POA: Diagnosis not present

## 2017-05-17 DIAGNOSIS — S32029S Unspecified fracture of second lumbar vertebra, sequela: Secondary | ICD-10-CM | POA: Diagnosis not present

## 2017-05-17 DIAGNOSIS — R293 Abnormal posture: Secondary | ICD-10-CM

## 2017-05-17 DIAGNOSIS — M791 Myalgia: Secondary | ICD-10-CM | POA: Insufficient documentation

## 2017-05-17 DIAGNOSIS — Z87891 Personal history of nicotine dependence: Secondary | ICD-10-CM | POA: Diagnosis not present

## 2017-05-17 DIAGNOSIS — I480 Paroxysmal atrial fibrillation: Secondary | ICD-10-CM | POA: Diagnosis not present

## 2017-05-17 DIAGNOSIS — Z9889 Other specified postprocedural states: Secondary | ICD-10-CM | POA: Insufficient documentation

## 2017-05-17 DIAGNOSIS — G8220 Paraplegia, unspecified: Secondary | ICD-10-CM

## 2017-05-17 DIAGNOSIS — M6281 Muscle weakness (generalized): Secondary | ICD-10-CM

## 2017-05-17 NOTE — Therapy (Signed)
[facility] [address] [place], [place], [address] Phone: [phone]   Fax:  [phone]  Physical Therapy Treatment  Patient Details  Name: [name] Weber MRN: [id number] Date of Birth: 1931/03/14 Referring Provider: Riley Kill  Encounter Date: 05/17/2017      PT End of Session - 05/17/17 1929    Visit Number 11  G1   Number of Visits 33  recert 6/15   Date for PT Re-Evaluation 05/11/17   Authorization Type UHC Medicare-GCODE every 10th visit   PT Start Time 1315   PT Stop Time 1400   PT Time Calculation (min) 45 min   Activity Tolerance Patient tolerated treatment well   Behavior During Therapy [facility] for tasks assessed/performed      Past Medical History:  Diagnosis Date  . Fatigue   . Malaise   . Myalgia   . PAC (premature atrial contraction)    ISOLATED  . Palpitations    OCCASSIONAL  . Paroxysmal atrial flutter (HCC)   . PVC's (premature ventricular contractions)    ISOLATED    Past Surgical History:  Procedure Laterality Date  . THORACIC LAMINECTOMY FOR EPIDURAL ABSCESS N/A 11/16/2014   Procedure: THORACIC LAMINECTOMY FOR EPIDURAL ABSCESS;  Surgeon: Karn Cassis, MD;  Location: [facility];  Service: Neurosurgery;  Laterality: N/A;  . VULVAR LESION REMOVAL  01/01/2009   She had a 1-cm area of erythema to the right of the urethral meatus    There were no vitals filed for this visit.      Subjective Assessment - 05/17/17 1317    Subjective Good with exercises at home.    Patient is accompained by: Family member   Pertinent History R pelvic fracture 12/25/16, T9 paraplegia, history of compression fractures; unable to wear AFOs currently due to hammer toe deformity L >R foot; blister on R heel (healed), recent UTI   Patient Stated Goals Pt's goal is for guidance for how to continue to get stronger.   Currently in Pain? No/denies                         [facility] Adult PT  Treatment/Exercise - 05/17/17 1903      Bed Mobility   Bed Mobility Supine to Sit;Sit to Supine   Supine to Sit 6: Modified independent (Device/Increase time)   Sit to Supine 6: Modified independent (Device/Increase time)     Transfers   Transfers Sit to Stand;Stand to Sit   Sit to Stand 4: Min guard;With upper extremity assist  one hand on thigh, one hand pushig off furniture.    Sit to Stand Details (indicate cue type and reason) throughout session required vc to scoot out to edge of seat prior to attempt to stand (without pushing backs of legs against furniture). When pt seated in lighter chair on tile, allowed "planned failure" by allowing chair to scoot back (~4 inches) when she pressed her knees against; pt with quick descent back into chair; despite this effort, pt continued to reqire max cues   Stand to Sit 4: Min guard;4: Min assist;Without upper extremity assist;With armrests;Uncontrolled descent   Stand to Sit Details assist for anterior wt-shift and controlled descent; hands on thighs   Number of Reps 10 reps     Ambulation/Gait   Ambulation/Gait Assistance 4: Min guard   Ambulation/Gait Assistance Details vc for proximity to RW, heel strike, slowing to allow time for heelstrike and advancing "tall" over stance leg  Ambulation Distance (Feet) 120 Feet  120, 50, 50, 50   Assistive device Rollator   Gait Pattern Step-through pattern;Decreased step length - right;Decreased step length - left;Decreased dorsiflexion - right;Decreased dorsiflexion - left;Right flexed knee in stance;Left flexed knee in stance;Wide base of support;Trunk flexed   Ambulation Surface Level   Pre-Gait Activities weight shifting fwd and back over staggered stance emphasizing hip and knee extension in mid-stance   Gait Comments Adjusted her current RW to proper height     Lumbar Exercises: Stretches   Passive Hamstring Stretch 1 rep;30 seconds   Quad Stretch 1 rep;30 seconds   Quad Stretch Limitations  supine, knee to chest with other leg extended (LLE could not fully extend to lie flat on mat table; RLE more flexible with Maisie Fus test/stretch tolerated             Balance Exercises - 05/17/17 1920      Balance Exercises: Standing   Standing Eyes Opened Wide (BOA);Head turns;Foam/compliant surface;2 reps   Standing Eyes Closed Wide (BOA);3 reps   Tandem Stance Eyes open;Eyes closed;Foam/compliant surface;Intermittent upper extremity support  partial tandem   Retro Gait Upper extremity support;3 reps  at counter   Heel Raises Limitations x 10   Toe Raise Limitations x 10             PT Short Term Goals - 05/14/17 1500      PT SHORT TERM GOAL #1   Title Pt will be independent with HEP for imrpoved strength, balance, and gait.  TARGET 04/11/17; TARGET updated to 04/30/17 due to missed sessions/illness: TARGET for all STGs 06/11/17   Time 4   Period Weeks   Status On-going     PT SHORT TERM GOAL #2   Title Pt will perform sit<>stand transfers, at least 4 of 5 trials, with minimal UE support, modified independently for improved transfer efficiency and safety.   Baseline 6/15 required mod assist of UEs   Time 4   Period Weeks   Status On-going     PT SHORT TERM GOAL #3   Title Pt will improve TUG score to less than or equal to 19 seconds for decreased fall risk.   Baseline 6/15 16.68   Time 4   Period Weeks   Status Achieved     PT SHORT TERM GOAL #4   Title Pt will improve Berg Balance score to at least 27/56 for decreased fall risk.   Baseline 6/15 23/56   Time 4   Period Weeks   Status On-going     PT SHORT TERM GOAL #5   Title ---   Baseline ---           PT Long Term Goals - 05/14/17 2249      PT LONG TERM GOAL #1   Title Pt/caregiver ([name]), will verbalize understanding of fall prevention within the home environmen.t  TARGET 05/11/17; TARGET for all LTGs 07/09/17   Time 8   Period Weeks   Status On-going     PT LONG TERM GOAL #2   Title Pt  will improve TUG score to less than or equal to 16 seconds for decreased fall risk. 6/15 updated to <=14 sec.    Time 8   Period Weeks   Status On-going     PT LONG TERM GOAL #3   Title Pt will improve Berg BAlance score to at least 32/56 for decreased fall risk.  05/14/17 updated to 30/56   Time 8   Period  Weeks   Status On-going     PT LONG TERM GOAL #4   Title Pt will improve gait velocity to at least 2.4 ft/sec for improved gait efficiency and safety with household and community gait.     Time 8   Period Weeks   Status On-going     PT LONG TERM GOAL #5   Title Pt/caregiver will verbalize plans for continued fitness post D/C from PT.   Time 8   Period Weeks   Status On-going               Plan - 05/17/17 1933    Clinical Impression Statement Session focused on LE strengthening, stretching, gait and balance training. Pt is very motivated, however progress may be impacted by decr memory. Will continue to work towards [employer]   Rehab Potential Good   PT Frequency 2x / week   PT Duration 8 weeks   PT Treatment/Interventions ADLs/Self Care Home Management;Functional mobility training;Gait training;DME Instruction;Therapeutic activities;Therapeutic exercise;Balance training;Neuromuscular re-education;Patient/family education;Orthotic Fit/Training   PT Next Visit Plan continue to focus on gait training with knee extension in stance phase, full hip extension-retro gait, tandem gait, Lt hip flexor stretching, decreasing UE dependence with transfers   Consulted and Agree with Plan of Care Patient      Patient will benefit from skilled therapeutic intervention in order to improve the following deficits and impairments:  Abnormal gait, Decreased balance, Decreased mobility, Decreased safety awareness, Decreased range of motion, Decreased strength, Impaired flexibility, Postural dysfunction  Visit Diagnosis: Other abnormalities of gait and mobility  Muscle weakness  (generalized)  Unsteadiness on feet  Abnormal posture     Problem List Patient Active Problem List   Diagnosis Date Noted  . Other osteoporosis with current pathological fracture   . Chronic midline low back pain without sciatica   . Thrombus   . Closed nondisplaced fracture of pelvis with routine healing   . Pelvic ring fracture (HCC)   . Osteoporosis with current pathological fracture   . Slow transit constipation   . Fractures   . Pain   . Generalized anxiety disorder   . Benign essential HTN   . Hypoalbuminemia due to protein-calorie malnutrition (HCC)   . Thrombocytopenia (HCC)   . Pulmonary hypertension (HCC)   . Age-related osteoporosis with current pathological fracture   . Pelvic fracture (HCC) 12/26/2016  . Multiple closed fractures of pelvis with stable disruption of pelvic circle (HCC)   . Memory loss   . Lumbar burst fracture (HCC) 06/08/2016  . Lumbar compression fracture (HCC) 06/08/2016  . Bilateral foot-drop   . Midline thoracic back pain   . A-fib (HCC)   . Depression   . Constipation due to pain medication   . Chronic constipation   . Compression fracture of L2 (HCC) 05/30/2016  . Elevated blood pressure 05/30/2016  . L2 vertebral fracture (HCC) 05/30/2016  . Anxiety state 12/10/2014  . Neurogenic bladder 11/30/2014  . Neurogenic bowel 11/30/2014  . Bacterial UTI 11/30/2014  . Thoracic myelopathy 11/29/2014  . Persistent atrial fibrillation (HCC) 11/27/2014  . Traumatic spinal subdural hematoma   . Acute pulmonary embolism (HCC)   . Orthostatic hypotension 11/22/2014  . Near syncope 11/21/2014  . Paraplegia at T9 level (HCC) 11/17/2014  . Weakness of both legs   . Nocturnal leg cramps 07/16/2014  . Encounter for therapeutic drug monitoring 12/25/2013  . Malaise and fatigue 06/01/2011  . Atrial flutter (HCC) 03/10/2011  . Long term (current) use of anticoagulants 03/10/2011  .  Benign hypertensive heart disease without heart failure  03/10/2011  . HLD (hyperlipidemia) 03/10/2011  . Paroxysmal atrial flutter (HCC)   . Palpitations   . PAC (premature atrial contraction)   . PVC's (premature ventricular contractions)   . Malaise   . Fatigue   . Myalgia     Zena Amos, PT 05/17/2017, 7:36 PM  [facility] [address] [place], [place], [address] Phone: [phone]   Fax:  [phone]  Name: [name] Weber MRN: [id number] Date of Birth: 04-17-1931

## 2017-05-17 NOTE — Patient Instructions (Signed)
PLEASE FEEL FREE TO CALL OUR OFFICE WITH ANY PROBLEMS OR QUESTIONS ([phone])      
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
## 2017-05-20 ENCOUNTER — Ambulatory Visit: Payer: Medicare Other | Admitting: Physical Therapy

## 2017-05-20 DIAGNOSIS — R2689 Other abnormalities of gait and mobility: Secondary | ICD-10-CM | POA: Diagnosis not present

## 2017-05-20 DIAGNOSIS — R2681 Unsteadiness on feet: Secondary | ICD-10-CM
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
## 2017-05-24 ENCOUNTER — Ambulatory Visit: Payer: Medicare Other | Admitting: Physical Therapy

## 2017-05-27 ENCOUNTER — Ambulatory Visit: Payer: Medicare Other | Admitting: Physical Therapy

## 2017-05-27 DIAGNOSIS — R2689 Other abnormalities of gait and mobility: Secondary | ICD-10-CM

## 2017-05-27 DIAGNOSIS — M6281 Muscle weakness (generalized): Secondary | ICD-10-CM

## 2017-05-27 NOTE — Patient Instructions (Signed)
(  Exercise) Monday Tuesday Wednesday Thursday Friday Saturday Sunday   Bed exercises           Seated exercises           Standing exercises           Walking [place]  -morning  -mid-day  -afternoon

## 2017-05-27 NOTE — Therapy (Signed)
[facility] [address] [place], [place], [address] Phone: [phone]   Fax:  [phone]  Physical Therapy Treatment  Patient Details  Name: [name] Weber MRN: [id number] Date of Birth: [birth year] Referring Provider: Riley Kill  Encounter Date: 05/27/2017      PT End of Session - 05/27/17 1600    Visit Number 13  G3   Number of Visits 33  recert 6/15   Date for PT Re-Evaluation 05/11/17   Authorization Type [employer] Medicare-GCODE every 10th visit   PT Start Time 1318   PT Stop Time 1400   PT Time Calculation (min) 42 min   Equipment Utilized During Treatment Gait belt   Activity Tolerance Patient tolerated treatment well   Behavior During Therapy WFL for tasks assessed/performed      Past Medical History:  Diagnosis Date  . Fatigue   . Malaise   . Myalgia   . PAC (premature atrial contraction)    ISOLATED  . Palpitations    OCCASSIONAL  . Paroxysmal atrial flutter (HCC)   . PVC's (premature ventricular contractions)    ISOLATED    Past Surgical History:  Procedure Laterality Date  . THORACIC LAMINECTOMY FOR EPIDURAL ABSCESS N/A 11/16/2014   Procedure: THORACIC LAMINECTOMY FOR EPIDURAL ABSCESS;  Surgeon: Karn Cassis, MD;  Location: [facility];  Service: Neurosurgery;  Laterality: N/A;  . VULVAR LESION REMOVAL  01/01/2009   She had a 1-cm area of erythema to the right of the urethral meatus    There were no vitals filed for this visit.      Subjective Assessment - 05/27/17 1319    Subjective No changes since last PT visit.   Patient is accompained by: Family member   Pertinent History R pelvic fracture 12/25/16, T9 paraplegia, history of compression fractures; unable to wear AFOs currently due to hammer toe deformity L >R foot; blister on R heel (healed), recent UTI   Patient Stated Goals Pt's goal is for guidance for how to continue to get stronger.   Currently in Pain? No/denies                          [facility] Adult PT Treatment/Exercise - 05/27/17 1321      Transfers   Transfers Sit to Stand;Stand to Sit   Sit to Stand 4: Min guard;5: Supervision;With upper extremity assist;From bed;From chair/3-in-1   Stand to Sit 4: Min guard;5: Supervision;With upper extremity assist;To chair/3-in-1   Number of Reps 10 reps  for functional lower extremity strengthening     Ambulation/Gait   Ambulation/Gait Yes   Ambulation/Gait Assistance 4: Min guard   Ambulation/Gait Assistance Details verbal cues for slowed pace, full knee extension in stance   Ambulation Distance (Feet) 400 Feet  then 100 ft   Assistive device Rolling walker   Gait Pattern Step-through pattern;Decreased step length - right;Decreased step length - left;Decreased dorsiflexion - right;Decreased dorsiflexion - left;Right flexed knee in stance;Left flexed knee in stance;Wide base of support;Trunk flexed   Ambulation Surface Level;Indoor     Exercises   Exercises Other Exercises   Other Exercises  Reviewed supine exercises as part of HEP (per request of Vie, pt's friend, who is concerned patient is not performing exercises regularly)-pt performs supine pelvic tilts x 10, supine bridging x 10 reps, single knee to chest 2 reps, sidelying clamshell abduction with red theraband, seated LAQ x 5 reps.  Reviewed frequency of HEP performance, and provided  handout on exercise chart as reminder for consistent performance of varied exercises of HEP.             Balance Exercises - 05/27/17 1552      Balance Exercises: Standing   Other Standing Exercises Alternating step taps to 6" and 12" steps 15 reps, then consecutive step taps x 15 reps to 6" steps; forward step taps to 12" step>then back step and weigthshift x 15 reps, with 1 UE support      Pt needs cues for full extension of knee in stance phase; needs cues for smooth control of transition of knee flexion/extension with forward step tap and  back weightshift activities.       PT Education - 05/27/17 1558    Education provided Yes   Education Details Review of HEP (from pictures from 03/24/17 visit)-per request of Retia PasseVie; discussed importance of trying to walk several times per day in home with RW as part of HEP/initiated exercise chart to try to organize exercises   Person(s) Educated Patient;Caregiver(s)   Methods Explanation;Demonstration;Tactile cues;Handout   Comprehension Verbalized understanding;Returned demonstration          PT Short Term Goals - 05/14/17 1500      PT SHORT TERM GOAL #1   Title Pt will be independent with HEP for imrpoved strength, balance, and gait.  TARGET 04/11/17; TARGET updated to 04/30/17 due to missed sessions/illness: TARGET for all STGs 06/11/17   Time 4   Period Weeks   Status On-going     PT SHORT TERM GOAL #2   Title Pt will perform sit<>stand transfers, at least 4 of 5 trials, with minimal UE support, modified independently for improved transfer efficiency and safety.   Baseline 6/15 required mod assist of UEs   Time 4   Period Weeks   Status On-going     PT SHORT TERM GOAL #3   Title Pt will improve TUG score to less than or equal to 19 seconds for decreased fall risk.   Baseline 6/15 16.68   Time 4   Period Weeks   Status Achieved     PT SHORT TERM GOAL #4   Title Pt will improve Berg Balance score to at least 27/56 for decreased fall risk.   Baseline 6/15 23/56   Time 4   Period Weeks   Status On-going     PT SHORT TERM GOAL #5   Title ---   Baseline ---           PT Long Term Goals - 05/14/17 2249      PT LONG TERM GOAL #1   Title Pt/caregiver ([name]), will verbalize understanding of fall prevention within the home environmen.t  TARGET 05/11/17; TARGET for all LTGs 07/09/17   Time 8   Period Weeks   Status On-going     PT LONG TERM GOAL #2   Title Pt will improve TUG score to less than or equal to 16 seconds for decreased fall risk. 6/15 updated to <=14  sec.    Time 8   Period Weeks   Status On-going     PT LONG TERM GOAL #3   Title Pt will improve Berg BAlance score to at least 32/56 for decreased fall risk.  05/14/17 updated to 30/56   Time 8   Period Weeks   Status On-going     PT LONG TERM GOAL #4   Title Pt will improve gait velocity to at least 2.4 ft/sec for improved gait efficiency and  safety with household and community gait.     Time 8   Period Weeks   Status On-going     PT LONG TERM GOAL #5   Title Pt/caregiver will verbalize plans for continued fitness post D/C from PT.   Time 8   Period Weeks   Status On-going               Plan - 05/27/17 1601    Clinical Impression Statement Continued to work on exercises for functional strengthening with sit<>stand, standing exercises and supine exercises.  Pt's friend/caregiver, Retia Passe, is concerned that patient is not performing HEP at home consistently enough or walking enough at home.  Pt reports she is doing exercises and walking.  PT initiated exercise chart to try to group exercises and walking to make sure she is performing consistently.  Pt will continue to benefit from skilled PT for strenghtening, balance, and gait.   Rehab Potential Good   PT Frequency 2x / week   PT Duration 8 weeks   PT Treatment/Interventions ADLs/Self Care Home Management;Functional mobility training;Gait training;DME Instruction;Therapeutic activities;Therapeutic exercise;Balance training;Neuromuscular re-education;Patient/family education;Orthotic Fit/Training   PT Next Visit Plan Continue to focus on gait training with knee extension in stance phase, full hip extension-retro gait, tandem gait, Lt hip flexor stretching, decreasing UE dependence with transfers   Consulted and Agree with Plan of Care Patient;Family member/caregiver   Family Member Consulted Vie-friend      Patient will benefit from skilled therapeutic intervention in order to improve the following deficits and impairments:   Abnormal gait, Decreased balance, Decreased mobility, Decreased safety awareness, Decreased range of motion, Decreased strength, Impaired flexibility, Postural dysfunction  Visit Diagnosis: Muscle weakness (generalized)  Other abnormalities of gait and mobility     Problem List Patient Active Problem List   Diagnosis Date Noted  . Other osteoporosis with current pathological fracture   . Chronic midline low back pain without sciatica   . Thrombus   . Closed nondisplaced fracture of pelvis with routine healing   . Pelvic ring fracture (HCC)   . Osteoporosis with current pathological fracture   . Slow transit constipation   . Fractures   . Pain   . Generalized anxiety disorder   . Benign essential HTN   . Hypoalbuminemia due to protein-calorie malnutrition (HCC)   . Thrombocytopenia (HCC)   . Pulmonary hypertension (HCC)   . Age-related osteoporosis with current pathological fracture   . Pelvic fracture (HCC) 12/26/2016  . Multiple closed fractures of pelvis with stable disruption of pelvic circle (HCC)   . Memory loss   . Lumbar burst fracture (HCC) 06/08/2016  . Lumbar compression fracture (HCC) 06/08/2016  . Bilateral foot-drop   . Midline thoracic back pain   . A-fib (HCC)   . Depression   . Constipation due to pain medication   . Chronic constipation   . Compression fracture of L2 (HCC) 05/30/2016  . Elevated blood pressure 05/30/2016  . L2 vertebral fracture (HCC) 05/30/2016  . Anxiety state 12/10/2014  . Neurogenic bladder 11/30/2014  . Neurogenic bowel 11/30/2014  . Bacterial UTI 11/30/2014  . Thoracic myelopathy 11/29/2014  . Persistent atrial fibrillation (HCC) 11/27/2014  . Traumatic spinal subdural hematoma   . Acute pulmonary embolism (HCC)   . Orthostatic hypotension 11/22/2014  . Near syncope 11/21/2014  . Paraplegia at T9 level (HCC) 11/17/2014  . Weakness of both legs   . Nocturnal leg cramps 07/16/2014  . Encounter for therapeutic drug monitoring  12/25/2013  .  Malaise and fatigue 06/01/2011  . Atrial flutter (HCC) 03/10/2011  . Long term (current) use of anticoagulants 03/10/2011  . Benign hypertensive heart disease without heart failure 03/10/2011  . HLD (hyperlipidemia) 03/10/2011  . Paroxysmal atrial flutter (HCC)   . Palpitations   . PAC (premature atrial contraction)   . PVC's (premature ventricular contractions)   . Malaise   . Fatigue   . Myalgia     MARRIOTT,AMY W. 05/27/2017, 4:05 PM Gean Maidens., PT [place] [facility] [address] [place], [place], [address] Phone: [phone]   Fax:  [phone]  Name: MAELI SPACEK MRN: [id number] Date of Birth: Aug 29, 1931

## 2017-05-28 ENCOUNTER — Ambulatory Visit: Payer: Medicare Other | Admitting: Physical Therapy

## 2017-05-28 ENCOUNTER — Encounter: Payer: Self-pay | Admitting: Physical Therapy

## 2017-05-28 DIAGNOSIS — R2681 Unsteadiness on feet: Secondary | ICD-10-CM

## 2017-05-28 DIAGNOSIS — R2689 Other abnormalities of gait and mobility: Secondary | ICD-10-CM

## 2017-05-28 DIAGNOSIS — M6281 Muscle weakness (generalized): Secondary | ICD-10-CM

## 2017-05-28 NOTE — Therapy (Signed)
[facility] [address] [place], [place], [address] Phone: [phone]   Fax:  [phone]  Physical Therapy Treatment  Patient Details  Name: [name] Weber MRN: [id number] Date of Birth: Mar 19, 1931 Referring Provider: Riley Kill  Encounter Date: 05/28/2017      PT End of Session - 05/28/17 1923    Visit Number 14  G4   Number of Visits 33  recert 6/15   Date for PT Re-Evaluation 07/13/17   Authorization Type [employer]HC Medicare-GCODE every 10th visit   PT Start Time 0934   PT Stop Time 1016   PT Time Calculation (min) 42 min   Equipment Utilized During Treatment Gait belt   Activity Tolerance Patient tolerated treatment well   Behavior During Therapy Impulsive;Agitated      Past Medical History:  Diagnosis Date  . Fatigue   . Malaise   . Myalgia   . PAC (premature atrial contraction)    ISOLATED  . Palpitations    OCCASSIONAL  . Paroxysmal atrial flutter (HCC)   . PVC's (premature ventricular contractions)    ISOLATED    Past Surgical History:  Procedure Laterality Date  . THORACIC LAMINECTOMY FOR EPIDURAL ABSCESS N/A 11/16/2014   Procedure: THORACIC LAMINECTOMY FOR EPIDURAL ABSCESS;  Surgeon: Karn Cassis, MD;  Location: [facility];  Service: Neurosurgery;  Laterality: N/A;  . VULVAR LESION REMOVAL  01/01/2009   She had a 1-cm area of erythema to the right of the urethral meatus    There were no vitals filed for this visit.      Subjective Assessment - 05/28/17 1908    Subjective No changes since last PT visit.    Patient is accompained by: Family member   Pertinent History R pelvic fracture 12/25/16, T9 paraplegia, history of compression fractures; unable to wear AFOs currently due to hammer toe deformity L >R foot; blister on R heel (healed), recent UTI   Patient Stated Goals Pt's goal is for guidance for how to continue to get stronger.   Currently in Pain? No/denies                          [facility] Adult PT Treatment/Exercise - 05/28/17 0001      Transfers   Transfers Sit to Stand;Stand to Sit   Sit to Stand 5: Supervision;4: Min guard;With upper extremity assist;Without upper extremity assist;From bed;With armrests;From chair/3-in-1   Stand to Sit 4: Min guard;5: Supervision   Number of Reps 10 reps  5 x2 sets     Ambulation/Gait   Ambulation/Gait Yes   Ambulation/Gait Assistance 5: Supervision   Ambulation/Gait Assistance Details vc for heelstrike and proximity to RW; maintains for at most 20 ft and then requires repeat cues   Ambulation Distance (Feet) 120 Feet  x3   Assistive device Rolling walker   Gait Pattern Step-through pattern;Decreased step length - right;Decreased step length - left;Decreased dorsiflexion - right;Decreased dorsiflexion - left;Right flexed knee in stance;Left flexed knee in stance;Wide base of support;Trunk flexed   Ambulation Surface Level     Knee/Hip Exercises: Machines for Strengthening   Cybex Leg Press 40# bil LEs 10 reps x 3 sets     Knee/Hip Exercises: Standing   Hip Flexion Stengthening;Both;1 set   Hip Flexion Limitations UE support   Hip Abduction Stengthening;Both;1 set   Abduction Limitations UE support   SLS bil UE support   Other Standing Knee Exercises sideways walk length of counter x  4             Balance Exercises - 05/28/17 1916      Balance Exercises: Standing   Standing Eyes Opened Wide (BOA);Foam/compliant surface;1 rep;30 secs  bil UE support on red mat   Standing Eyes Closed Wide (BOA);Foam/compliant surface;1 rep;10 secs  bil UE support on red mat   SLS with Vectors Foam/compliant surface;Upper extremity assist 2  10 reps each leg   Other Standing Exercises standing with RW, reaching ~10" anterolateral to pick up cones (at counter height) and place on mat table (using same UE); 10 cones, repeated each side; pt required assist to weight shift toward the cones as she  was trying to reach forward           PT Education - 05/28/17 1921    Education provided Yes   Education Details reviewed use of HEP chart provided 6/28 with pt verbalizing understanding of it's use; reviewed standing hip exercises she has been performing from prior episode of PT   Person(s) Educated Patient   Methods Explanation;Demonstration   Comprehension Verbalized understanding          PT Short Term Goals - 05/14/17 1500      PT SHORT TERM GOAL #1   Title Pt will be independent with HEP for imrpoved strength, balance, and gait.  TARGET 04/11/17; TARGET updated to 04/30/17 due to missed sessions/illness: TARGET for all STGs 06/11/17   Time 4   Period Weeks   Status On-going     PT SHORT TERM GOAL #2   Title Pt will perform sit<>stand transfers, at least 4 of 5 trials, with minimal UE support, modified independently for improved transfer efficiency and safety.   Baseline 6/15 required mod assist of UEs   Time 4   Period Weeks   Status On-going     PT SHORT TERM GOAL #3   Title Pt will improve TUG score to less than or equal to 19 seconds for decreased fall risk.   Baseline 6/15 16.68   Time 4   Period Weeks   Status Achieved     PT SHORT TERM GOAL #4   Title Pt will improve Berg Balance score to at least 27/56 for decreased fall risk.   Baseline 6/15 23/56   Time 4   Period Weeks   Status On-going     PT SHORT TERM GOAL #5   Title ---   Baseline ---           PT Long Term Goals - 05/14/17 2249      PT LONG TERM GOAL #1   Title Pt/caregiver ([name]), will verbalize understanding of fall prevention within the home environmen.t  TARGET 05/11/17; TARGET for all LTGs 07/09/17   Time 8   Period Weeks   Status On-going     PT LONG TERM GOAL #2   Title Pt will improve TUG score to less than or equal to 16 seconds for decreased fall risk. 6/15 updated to <=14 sec.    Time 8   Period Weeks   Status On-going     PT LONG TERM GOAL #3   Title Pt will  improve Berg BAlance score to at least 32/56 for decreased fall risk.  05/14/17 updated to 30/56   Time 8   Period Weeks   Status On-going     PT LONG TERM GOAL #4   Title Pt will improve gait velocity to at least 2.4 ft/sec for improved gait efficiency and  safety with household and community gait.     Time 8   Period Weeks   Status On-going     PT LONG TERM GOAL #5   Title Pt/caregiver will verbalize plans for continued fitness post D/C from PT.   Time 8   Period Weeks   Status On-going               Plan - 05/28/17 1927    Clinical Impression Statement Session focused on gait training, safe use of RW, and bil LE strengthening. Also reviewed use of exercise chart created to help patient with completing HEP daily. During reaching activity while using RW, pt noted to be anxious and fearful of reaching outside her BOS. Vie, caregiver, asked that we continue to incorporate reaching activities to improve patient's ability to retrieve items for simple meal prep.    Rehab Potential Good   PT Frequency 2x / week   PT Duration 8 weeks   PT Treatment/Interventions ADLs/Self Care Home Management;Functional mobility training;Gait training;DME Instruction;Therapeutic activities;Therapeutic exercise;Balance training;Neuromuscular re-education;Patient/family education;Orthotic Fit/Training   PT Next Visit Plan Per [facility] request, reaching activities while using RW to simulate reaching tasks she does in the kitchen for simple meal prep; gait training with knee extension in stance phase, full hip extension-retro gait, tandem gait, Lt hip flexor stretching, decreasing UE dependence with transfers   Consulted and Agree with Plan of Care Patient;Family member/caregiver   Family Member Consulted Vie-friend      Patient will benefit from skilled therapeutic intervention in order to improve the following deficits and impairments:  Abnormal gait, Decreased balance, Decreased mobility, Decreased safety  awareness, Decreased range of motion, Decreased strength, Impaired flexibility, Postural dysfunction  Visit Diagnosis: Muscle weakness (generalized)  Other abnormalities of gait and mobility  Unsteadiness on feet     Problem List Patient Active Problem List   Diagnosis Date Noted  . Other osteoporosis with current pathological fracture   . Chronic midline low back pain without sciatica   . Thrombus   . Closed nondisplaced fracture of pelvis with routine healing   . Pelvic ring fracture (HCC)   . Osteoporosis with current pathological fracture   . Slow transit constipation   . Fractures   . Pain   . Generalized anxiety disorder   . Benign essential HTN   . Hypoalbuminemia due to protein-calorie malnutrition (HCC)   . Thrombocytopenia (HCC)   . Pulmonary hypertension (HCC)   . Age-related osteoporosis with current pathological fracture   . Pelvic fracture (HCC) 12/26/2016  . Multiple closed fractures of pelvis with stable disruption of pelvic circle (HCC)   . Memory loss   . Lumbar burst fracture (HCC) 06/08/2016  . Lumbar compression fracture (HCC) 06/08/2016  . Bilateral foot-drop   . Midline thoracic back pain   . A-fib (HCC)   . Depression   . Constipation due to pain medication   . Chronic constipation   . Compression fracture of L2 (HCC) 05/30/2016  . Elevated blood pressure 05/30/2016  . L2 vertebral fracture (HCC) 05/30/2016  . Anxiety state 12/10/2014  . Neurogenic bladder 11/30/2014  . Neurogenic bowel 11/30/2014  . Bacterial UTI 11/30/2014  . Thoracic myelopathy 11/29/2014  . Persistent atrial fibrillation (HCC) 11/27/2014  . Traumatic spinal subdural hematoma   . Acute pulmonary embolism (HCC)   . Orthostatic hypotension 11/22/2014  . Near syncope 11/21/2014  . Paraplegia at T9 level (HCC) 11/17/2014  . Weakness of both legs   . Nocturnal leg cramps 07/16/2014  .  Encounter for therapeutic drug monitoring 12/25/2013  . Malaise and fatigue 06/01/2011   . Atrial flutter (HCC) 03/10/2011  . Long term (current) use of anticoagulants 03/10/2011  . Benign hypertensive heart disease without heart failure 03/10/2011  . HLD (hyperlipidemia) 03/10/2011  . Paroxysmal atrial flutter (HCC)   . Palpitations   . PAC (premature atrial contraction)   . PVC's (premature ventricular contractions)   . Malaise   . Fatigue   . Myalgia     Zena Amos, PT 05/28/2017, 7:33 PM  [id number] [facility] [address] [place], [place], [address] Phone: [phone]   Fax:  [phone]  Name: [name] Weber MRN: [id number] Date of Birth: 10/25/1931

## 2017-06-01 ENCOUNTER — Ambulatory Visit: Payer: Medicare Other | Admitting: Physical Therapy

## 2017-06-03 ENCOUNTER — Ambulatory Visit: Payer: Medicare Other | Attending: Physical Medicine & Rehabilitation | Admitting: Physical Therapy

## 2017-06-03 DIAGNOSIS — R2689 Other abnormalities of gait and mobility: Secondary | ICD-10-CM | POA: Insufficient documentation

## 2017-06-03 DIAGNOSIS — R293 Abnormal posture: Secondary | ICD-10-CM | POA: Insufficient documentation

## 2017-06-03 DIAGNOSIS — R2681 Unsteadiness on feet: Secondary | ICD-10-CM | POA: Diagnosis not present

## 2017-06-03 DIAGNOSIS — M6281 Muscle weakness (generalized): Secondary | ICD-10-CM | POA: Diagnosis present

## 2017-06-03 NOTE — Therapy (Signed)
[facility] [address] [place], [place], [address] Phone: [phone]   Fax:  [phone]  Physical Therapy Treatment  Patient Details  Name: [name] Weber MRN: [id number] Date of Birth: 08/30/1931 Referring Provider: [name]  Encounter Date: 06/03/2017      PT End of Session - 06/03/17 1516    Visit Number 15  G5   Number of Visits 33  recert 6/15   Date for PT Re-Evaluation 07/13/17   Authorization Type UHC Medicare-GCODE every 10th visit   PT Start Time 1318   PT Stop Time 1402   PT Time Calculation (min) 44 min   Equipment Utilized During Treatment Gait belt   Activity Tolerance Patient tolerated treatment well   Behavior During Therapy Impulsive;Agitated      Past Medical History:  Diagnosis Date  . Fatigue   . Malaise   . Myalgia   . PAC (premature atrial contraction)    ISOLATED  . Palpitations    OCCASSIONAL  . Paroxysmal atrial flutter (HCC)   . PVC's (premature ventricular contractions)    ISOLATED    Past Surgical History:  Procedure Laterality Date  . THORACIC LAMINECTOMY FOR EPIDURAL ABSCESS N/A 11/16/2014   Procedure: THORACIC LAMINECTOMY FOR EPIDURAL ABSCESS;  Surgeon: Karn CassisErnesto M Botero, MD;  Location: [facility];  Service: Neurosurgery;  Laterality: N/A;  . VULVAR LESION REMOVAL  01/01/2009   She had a 1-cm area of erythema to the right of the urethral meatus    There were no vitals filed for this visit.      Subjective Assessment - 06/03/17 1318    Subjective Twisted ankle a little yesterday, trying to step down onto porch at friend's house yesterday.   Patient is accompained by: Family member   Pertinent History R pelvic fracture 12/25/16, T9 paraplegia, history of compression fractures; unable to wear AFOs currently due to hammer toe deformity L >R foot; blister on R heel (healed), recent UTI   Patient Stated Goals Pt's goal is for guidance for how to continue to get  stronger.   Currently in Pain? Yes   Pain Score 3    Pain Location Ankle   Pain Orientation Right   Pain Descriptors / Indicators Aching   Pain Type Acute pain   Pain Onset Yesterday   Pain Frequency Intermittent   Aggravating Factors  First standing up and walking   Pain Relieving Factors after getting started with walking, it's better                         [facility] Adult PT Treatment/Exercise - 06/03/17 1325      Transfers   Transfers Sit to Stand;Stand to Sit   Sit to Stand 5: Supervision;With upper extremity assist;From bed   Stand to Sit 5: Supervision;With upper extremity assist;To bed   Number of Reps 10 reps     Ambulation/Gait   Ambulation/Gait Yes   Ambulation/Gait Assistance 5: Supervision   Ambulation/Gait Assistance Details Verbal cues for heelstrike and staying close to RW   Ambulation Distance (Feet) 230 Feet  60 ft x 2; 350 ft   Assistive device Rolling walker   Gait Pattern Step-through pattern;Decreased step length - right;Decreased step length - left;Decreased dorsiflexion - right;Decreased dorsiflexion - left;Right flexed knee in stance;Left flexed knee in stance;Wide base of support;Trunk flexed   Ambulation Surface Level;Indoor             Balance Exercises - 06/03/17  1510      Balance Exercises: Standing   Other Standing Exercises Standing activities at counter, at table with reaching activities to simulate activities at home:  sidestepping at counter to pick up cones (anterolateral), then switch hands and sidestep to place cones to other side 4 sets of 6 reps; then across body reaching for cone at counter, to place on table with 1 UE support at all times.  Standing in front of elevated mat table with locked rolling table in front, with reaching anterolateral and placing object on table in front of her (x at least 10 reps each side); with 1 UE support .  Standing at counter, minisquat to reach down to rollign stool to pick up cone and  place on counter (simulating tasks of feeding and watering her dog).  Tried to simulate household activities, and recommended pt continue to use RW and/or counter support of 1 hand when reaching and performing more dynamic balance activities.  Advised pt to make sure walker is secure if she is holding onto walker and reaching.  Widened BOS lateral weightshifting and reaching x 10 reps each side.             PT Short Term Goals - 05/14/17 1500      PT SHORT TERM GOAL #1   Title Pt will be independent with HEP for imrpoved strength, balance, and gait.  TARGET 04/11/17; TARGET updated to 04/30/17 due to missed sessions/illness: TARGET for all STGs 06/11/17   Time 4   Period Weeks   Status On-going     PT SHORT TERM GOAL #2   Title Pt will perform sit<>stand transfers, at least 4 of 5 trials, with minimal UE support, modified independently for improved transfer efficiency and safety.   Baseline 6/15 required mod assist of UEs   Time 4   Period Weeks   Status On-going     PT SHORT TERM GOAL #3   Title Pt will improve TUG score to less than or equal to 19 seconds for decreased fall risk.   Baseline 6/15 16.68   Time 4   Period Weeks   Status Achieved     PT SHORT TERM GOAL #4   Title Pt will improve Berg Balance score to at least 27/56 for decreased fall risk.   Baseline 6/15 23/56   Time 4   Period Weeks   Status On-going     PT SHORT TERM GOAL #5   Title ---   Baseline ---           PT Long Term Goals - 05/14/17 2249      PT LONG TERM GOAL #1   Title Pt/caregiver ([name]), will verbalize understanding of fall prevention within the home environmen.t  TARGET 05/11/17; TARGET for all LTGs 07/09/17   Time 8   Period Weeks   Status On-going     PT LONG TERM GOAL #2   Title Pt will improve TUG score to less than or equal to 16 seconds for decreased fall risk. 6/15 updated to <=14 sec.    Time 8   Period Weeks   Status On-going     PT LONG TERM GOAL #3   Title Pt  will improve Berg BAlance score to at least 32/56 for decreased fall risk.  05/14/17 updated to 30/56   Time 8   Period Weeks   Status On-going     PT LONG TERM GOAL #4   Title Pt will improve gait velocity to at  least 2.4 ft/sec for improved gait efficiency and safety with household and community gait.     Time 8   Period Weeks   Status On-going     PT LONG TERM GOAL #5   Title Pt/caregiver will verbalize plans for continued fitness post D/C from PT.   Time 8   Period Weeks   Status On-going               Plan - 06/03/17 1516    Clinical Impression Statement Pt continues to need occasional tactile and verbal cues for weightshifting when performing dynamic standing balance activities.  In addition, PT provides verbal cues to slow pace of activities.  Pt reports having perhaps twisted her ankle yesterday.  No c/o increase in pain during PT session, but did advise pt to elevate, ice for 10-15 minutes 1-2 times per day and monitor if pain increases.   Rehab Potential Good   PT Frequency 2x / week   PT Duration 8 weeks   PT Treatment/Interventions ADLs/Self Care Home Management;Functional mobility training;Gait training;DME Instruction;Therapeutic activities;Therapeutic exercise;Balance training;Neuromuscular re-education;Patient/family education;Orthotic Fit/Training   PT Next Visit Plan Per [facility] request, continue reaching/dynamic activities while using RW to simulate reaching tasks she does in the kitchen for simple meal prep; gait training with knee extension in stance phase, full hip extension-retro gait, tandem gait, Lt hip flexor stretching, decreasing UE dependence with transfers-CHECK STGs next week, I believe   Consulted and Agree with Plan of Care Patient;Family member/caregiver   Family Member Consulted Vie-friend      Patient will benefit from skilled therapeutic intervention in order to improve the following deficits and impairments:  Abnormal gait, Decreased balance,  Decreased mobility, Decreased safety awareness, Decreased range of motion, Decreased strength, Impaired flexibility, Postural dysfunction  Visit Diagnosis: Unsteadiness on feet  Other abnormalities of gait and mobility     Problem List Patient Active Problem List   Diagnosis Date Noted  . Other osteoporosis with current pathological fracture   . Chronic midline low back pain without sciatica   . Thrombus   . Closed nondisplaced fracture of pelvis with routine healing   . Pelvic ring fracture (HCC)   . Osteoporosis with current pathological fracture   . Slow transit constipation   . Fractures   . Pain   . Generalized anxiety disorder   . Benign essential HTN   . Hypoalbuminemia due to protein-calorie malnutrition (HCC)   . Thrombocytopenia (HCC)   . Pulmonary hypertension (HCC)   . Age-related osteoporosis with current pathological fracture   . Pelvic fracture (HCC) 12/26/2016  . Multiple closed fractures of pelvis with stable disruption of pelvic circle (HCC)   . Memory loss   . Lumbar burst fracture (HCC) 06/08/2016  . Lumbar compression fracture (HCC) 06/08/2016  . Bilateral foot-drop   . Midline thoracic back pain   . A-fib (HCC)   . Depression   . Constipation due to pain medication   . Chronic constipation   . Compression fracture of L2 (HCC) 05/30/2016  . Elevated blood pressure 05/30/2016  . L2 vertebral fracture (HCC) 05/30/2016  . Anxiety state 12/10/2014  . Neurogenic bladder 11/30/2014  . Neurogenic bowel 11/30/2014  . Bacterial UTI 11/30/2014  . Thoracic myelopathy 11/29/2014  . Persistent atrial fibrillation (HCC) 11/27/2014  . Traumatic spinal subdural hematoma   . Acute pulmonary embolism (HCC)   . Orthostatic hypotension 11/22/2014  . Near syncope 11/21/2014  . Paraplegia at T9 level (HCC) 11/17/2014  . Weakness of both  legs   . Nocturnal leg cramps 07/16/2014  . Encounter for therapeutic drug monitoring 12/25/2013  . Malaise and fatigue  06/01/2011  . Atrial flutter (HCC) 03/10/2011  . Long term (current) use of anticoagulants 03/10/2011  . Benign hypertensive heart disease without heart failure 03/10/2011  . HLD (hyperlipidemia) 03/10/2011  . Paroxysmal atrial flutter (HCC)   . Palpitations   . PAC (premature atrial contraction)   . PVC's (premature ventricular contractions)   . Malaise   . Fatigue   . Myalgia     MARRIOTT,AMY W. 06/03/2017, 3:21 PM Gean Maidens., PT [facility] [address] [place], [place], [address] Phone: [phone]   Fax:  [phone]  Name: DASHANTI BURR MRN: [id number] Date of Birth: 1930-12-06

## 2017-06-08 ENCOUNTER — Encounter: Payer: Self-pay | Admitting: Physical Therapy

## 2017-06-08 ENCOUNTER — Ambulatory Visit: Payer: Medicare Other | Admitting: Physical Therapy

## 2017-06-08 DIAGNOSIS — R2689 Other abnormalities of gait and mobility: Secondary | ICD-10-CM

## 2017-06-08 DIAGNOSIS — R2681 Unsteadiness on feet: Secondary | ICD-10-CM

## 2017-06-08 DIAGNOSIS — R293 Abnormal posture: Secondary | ICD-10-CM

## 2017-06-08 DIAGNOSIS — M6281 Muscle weakness (generalized): Secondary | ICD-10-CM

## 2017-06-08 NOTE — Therapy (Signed)
[facility] [address] [place], [place], [address] Phone: [phone]   Fax:  [phone]  Physical Therapy Treatment  Patient Details  Name: [name] Weber MRN: [id number] Date of Birth: 06/11/1931 Referring Provider: Riley Kill  Encounter Date: 06/08/2017      PT End of Session - 06/08/17 1455    Visit Number 16  G6   Number of Visits 33  recert 6/15   Date for PT Re-Evaluation 07/13/17   Authorization Type UHC Medicare-GCODE every 10th visit   PT Start Time 1405   PT Stop Time 1447   PT Time Calculation (min) 42 min   Equipment Utilized During Treatment Gait belt   Activity Tolerance Patient limited by pain  during balance activities, pt reported ankle and rt knee pain   Behavior During Therapy Restless      Past Medical History:  Diagnosis Date  . Fatigue   . Malaise   . Myalgia   . PAC (premature atrial contraction)    ISOLATED  . Palpitations    OCCASSIONAL  . Paroxysmal atrial flutter (HCC)   . PVC's (premature ventricular contractions)    ISOLATED    Past Surgical History:  Procedure Laterality Date  . THORACIC LAMINECTOMY FOR EPIDURAL ABSCESS N/A 11/16/2014   Procedure: THORACIC LAMINECTOMY FOR EPIDURAL ABSCESS;  Surgeon: Karn Cassis, MD;  Location: [facility];  Service: Neurosurgery;  Laterality: N/A;  . VULVAR LESION REMOVAL  01/01/2009   She had a 1-cm area of erythema to the right of the urethral meatus    There were no vitals filed for this visit.      Subjective Assessment - 06/08/17 1410    Subjective Right ankle still sore from twisting it the other day.    Patient is accompained by: Family member   Pertinent History R pelvic fracture 12/25/16, T9 paraplegia, history of compression fractures; unable to wear AFOs currently due to hammer toe deformity L >R foot; blister on R heel (healed), recent UTI   Patient Stated Goals Pt's goal is for guidance for how to continue to  get stronger.   Currently in Pain? Yes   Pain Score 2    Pain Location Ankle   Pain Orientation Right   Pain Descriptors / Indicators Aching   Pain Type Acute pain   Pain Onset Yesterday   Pain Frequency Intermittent                         [facility] Adult PT Treatment/Exercise - 06/08/17 0001      Transfers   Transfers Sit to Stand;Stand to Sit   Sit to Stand 5: Supervision;4: Min assist   Sit to Stand Details (indicate cue type and reason) from 19" height supervision for vc for safe use of RW; from 17" height pt required min assist to control descent and initiate standing (without use of UE support)   Stand to Sit Details required progressively less assist to control descent as she progressed (to 17" ht)   Number of Reps 10 reps     Ambulation/Gait   Ambulation/Gait Assistance 5: Supervision   Ambulation/Gait Assistance Details vc for proximity to RW initially, however pt began to self correct her position during second walk   Ambulation Distance (Feet) 120 Feet  240   Assistive device Rolling walker   Gait Pattern Step-through pattern;Decreased step length - right;Decreased step length - left;Decreased dorsiflexion - right;Decreased dorsiflexion - left;Right flexed knee in  stance;Left flexed knee in stance;Wide base of support;Trunk flexed   Ambulation Surface Level;Indoor     Balance   Balance Assessed Yes     Therapeutic Activites    Therapeutic Activities Other Therapeutic Activities   Other Therapeutic Activities seated on green air disc on squishy mat table with reaching bil UEs to target hi, lo, overhead, and crossing midline     Lumbar Exercises: Supine   Bridge Compliant;10 reps;2 seconds   Bridge Limitations legs on blue peanut, arms across her chest     Knee/Hip Exercises: Supine   Short Arc Quad Sets Strengthening;Both;1 set;10 reps  3# with 2 sec hold; over blue peanut                  PT Short Term Goals - 05/14/17 1500      PT  SHORT TERM GOAL #1   Title Pt will be independent with HEP for imrpoved strength, balance, and gait.  TARGET 04/11/17; TARGET updated to 04/30/17 due to missed sessions/illness: TARGET for all STGs 06/11/17   Time 4   Period Weeks   Status On-going     PT SHORT TERM GOAL #2   Title Pt will perform sit<>stand transfers, at least 4 of 5 trials, with minimal UE support, modified independently for improved transfer efficiency and safety.   Baseline 6/15 required mod assist of UEs   Time 4   Period Weeks   Status On-going     PT SHORT TERM GOAL #3   Title Pt will improve TUG score to less than or equal to 19 seconds for decreased fall risk.   Baseline 6/15 16.68   Time 4   Period Weeks   Status Achieved     PT SHORT TERM GOAL #4   Title Pt will improve Berg Balance score to at least 27/56 for decreased fall risk.   Baseline 6/15 23/56   Time 4   Period Weeks   Status On-going     PT SHORT TERM GOAL #5   Title ---   Baseline ---           PT Long Term Goals - 05/14/17 2249      PT LONG TERM GOAL #1   Title Pt/caregiver ([name]), will verbalize understanding of fall prevention within the home environmen.t  TARGET 05/11/17; TARGET for all LTGs 07/09/17   Time 8   Period Weeks   Status On-going     PT LONG TERM GOAL #2   Title Pt will improve TUG score to less than or equal to 16 seconds for decreased fall risk. 6/15 updated to <=14 sec.    Time 8   Period Weeks   Status On-going     PT LONG TERM GOAL #3   Title Pt will improve Berg BAlance score to at least 32/56 for decreased fall risk.  05/14/17 updated to 30/56   Time 8   Period Weeks   Status On-going     PT LONG TERM GOAL #4   Title Pt will improve gait velocity to at least 2.4 ft/sec for improved gait efficiency and safety with household and community gait.     Time 8   Period Weeks   Status On-going     PT LONG TERM GOAL #5   Title Pt/caregiver will verbalize plans for continued fitness post D/C from PT.    Time 8   Period Weeks   Status On-going  Plan - 06/08/17 1456    Clinical Impression Statement Session focused initially on balance and reaching activities, however pt reporting Rt ankle and knee pain. Remainder of session focused on strengthening (LEs and torso) and sitting balance. Patient did surprisingly well with trunk control. Will continue to work towards goals.    Rehab Potential Good   PT Frequency 2x / week   PT Duration 8 weeks   PT Treatment/Interventions ADLs/Self Care Home Management;Functional mobility training;Gait training;DME Instruction;Therapeutic activities;Therapeutic exercise;Balance training;Neuromuscular re-education;Patient/family education;Orthotic Fit/Training   PT Next Visit Plan CHECK STGs,: Per [facility] request, continue reaching/dynamic activities while using RW to simulate reaching tasks she does in the kitchen for simple meal prep; gait training with knee extension in stance phase, full hip extension-retro gait, tandem gait, Lt hip flexor stretching, decreasing UE dependence with transfers-   Consulted and Agree with Plan of Care Patient;Family member/caregiver   Family Member Consulted Vie-friend      Patient will benefit from skilled therapeutic intervention in order to improve the following deficits and impairments:  Abnormal gait, Decreased balance, Decreased mobility, Decreased safety awareness, Decreased range of motion, Decreased strength, Impaired flexibility, Postural dysfunction  Visit Diagnosis: Abnormal posture  Muscle weakness (generalized)  Unsteadiness on feet  Other abnormalities of gait and mobility     Problem List Patient Active Problem List   Diagnosis Date Noted  . Other osteoporosis with current pathological fracture   . Chronic midline low back pain without sciatica   . Thrombus   . Closed nondisplaced fracture of pelvis with routine healing   . Pelvic ring fracture (HCC)   . Osteoporosis with current  pathological fracture   . Slow transit constipation   . Fractures   . Pain   . Generalized anxiety disorder   . Benign essential HTN   . Hypoalbuminemia due to protein-calorie malnutrition (HCC)   . Thrombocytopenia (HCC)   . Pulmonary hypertension (HCC)   . Age-related osteoporosis with current pathological fracture   . Pelvic fracture (HCC) 12/26/2016  . Multiple closed fractures of pelvis with stable disruption of pelvic circle (HCC)   . Memory loss   . Lumbar burst fracture (HCC) 06/08/2016  . Lumbar compression fracture (HCC) 06/08/2016  . Bilateral foot-drop   . Midline thoracic back pain   . A-fib (HCC)   . Depression   . Constipation due to pain medication   . Chronic constipation   . Compression fracture of L2 (HCC) 05/30/2016  . Elevated blood pressure 05/30/2016  . L2 vertebral fracture (HCC) 05/30/2016  . Anxiety state 12/10/2014  . Neurogenic bladder 11/30/2014  . Neurogenic bowel 11/30/2014  . Bacterial UTI 11/30/2014  . Thoracic myelopathy 11/29/2014  . Persistent atrial fibrillation (HCC) 11/27/2014  . Traumatic spinal subdural hematoma   . Acute pulmonary embolism (HCC)   . Orthostatic hypotension 11/22/2014  . Near syncope 11/21/2014  . Paraplegia at T9 level (HCC) 11/17/2014  . Weakness of both legs   . Nocturnal leg cramps 07/16/2014  . Encounter for therapeutic drug monitoring 12/25/2013  . Malaise and fatigue 06/01/2011  . Atrial flutter (HCC) 03/10/2011  . Long term (current) use of anticoagulants 03/10/2011  . Benign hypertensive heart disease without heart failure 03/10/2011  . HLD (hyperlipidemia) 03/10/2011  . Paroxysmal atrial flutter (HCC)   . Palpitations   . PAC (premature atrial contraction)   . PVC's (premature ventricular contractions)   . Malaise   . Fatigue   . Myalgia     Zena Amos, PT 06/08/2017,  3:10 PM  [facility] [address] [place], [place],  [address] Phone: [phone]   Fax:  [phone]  Name: [name] Weber MRN: [id number] Date of Birth: Aug 23, 1931

## 2017-06-09 ENCOUNTER — Ambulatory Visit: Payer: Medicare Other | Admitting: Physical Therapy

## 2017-06-09 ENCOUNTER — Encounter: Payer: Self-pay | Admitting: Physical Therapy

## 2017-06-09 DIAGNOSIS — R2681 Unsteadiness on feet: Secondary | ICD-10-CM

## 2017-06-09 DIAGNOSIS — R2689 Other abnormalities of gait and mobility: Secondary | ICD-10-CM

## 2017-06-09 NOTE — Patient Instructions (Addendum)
Butterfly, Supine    Lie on back, feet flat on the bed. Green band around your thighs. Lower knees toward floor. Hold __3_ seconds. Repeat __10_ times per session. Do __2_ sessions per day.  Copyright  VHI. All rights reserved.

## 2017-06-09 NOTE — Therapy (Signed)
[facility] [address] [place], [place], [address] Phone: [phone]   Fax:  [phone]  Physical Therapy Treatment  Patient Details  Name: [name] Weber MRN: [id number] Date of Birth: 1931/11/10 Referring Provider: Naaman Plummer  Encounter Date: 06/09/2017      PT End of Session - 06/09/17 1315    Visit Number 43  [place]   Number of Visits 33  recert 4/49   Date for PT Re-Evaluation 07/13/17   Authorization Type UHC Medicare-GCODE every 10th visit   PT Start Time 1020   PT Stop Time 1100   PT Time Calculation (min) 40 min   Equipment Utilized During Treatment Gait belt   Activity Tolerance Patient tolerated treatment well   Behavior During Therapy [facility] for tasks assessed/performed      Past Medical History:  Diagnosis Date  . Fatigue   . Malaise   . Myalgia   . PAC (premature atrial contraction)    ISOLATED  . Palpitations    OCCASSIONAL  . Paroxysmal atrial flutter ([place])   . PVC's (premature ventricular contractions)    ISOLATED    Past Surgical History:  Procedure Laterality Date  . THORACIC LAMINECTOMY FOR EPIDURAL ABSCESS N/A 11/16/2014   Procedure: THORACIC LAMINECTOMY FOR EPIDURAL ABSCESS;  Surgeon: Floyce Stakes, MD;  Location: [facility];  Service: Neurosurgery;  Laterality: N/A;  . VULVAR LESION REMOVAL  01/01/2009   She had a 1-cm area of erythema to the right of the urethral meatus    There were no vitals filed for this visit.      Subjective Assessment - 06/09/17 1023    Subjective Ankle still a bit sore, about the same. Wants to work on what will help her be able to walk well enough to go on vacation.    Patient is accompained by: Family member   Pertinent History R pelvic fracture 12/25/16, T9 paraplegia, history of compression fractures; unable to wear AFOs currently due to hammer toe deformity L >R foot; blister on R heel (healed), recent UTI   Patient Stated Goals Pt's goal is  for guidance for how to continue to get stronger.   Currently in Pain? Yes   Pain Score 2    Pain Location Ankle   Pain Orientation Right   Pain Descriptors / Indicators Aching   Pain Type Acute pain   Pain Onset Yesterday   Pain Frequency Intermittent                         [facility] Adult PT Treatment/Exercise - 06/09/17 1035      Bed Mobility   Bed Mobility Supine to Sit;Sit to Supine   Rolling Right 7: Independent   Right Sidelying to Sit 6: Modified independent (Device/Increase time)   Sit to Supine 6: Modified independent (Device/Increase time)     Transfers   Transfers Sit to Stand;Stand to Sit   Sit to Stand 6: Modified independent (Device/Increase time)   Sit to Stand Details (indicate cue type and reason) no cues needed for safe use of RW, no imbalance   Stand to Sit 6: Modified independent (Device/Increase time);With upper extremity assist   Number of Reps 10 reps  5 consecutive, others during session     Ambulation/Gait   Ambulation/Gait Assistance 5: Supervision   Ambulation/Gait Assistance Details on initial walk from lobby, pt self-corrected her proximity to RW without cues, however during session required frequent cues; continues with steppage gait  due to bil foot drop   Ambulation Distance (Feet) 120 Feet  120,    Assistive device Rolling walker   Gait Pattern Step-through pattern;Decreased step length - right;Decreased step length - left;Decreased dorsiflexion - right;Decreased dorsiflexion - left;Right flexed knee in stance;Left flexed knee in stance;Wide base of support;Trunk flexed;Right steppage;Left steppage;Right foot flat;Left foot flat   Ambulation Surface Level;Indoor     Berg Balance Test   Sit to Stand Able to stand  independently using hands   Standing Unsupported Able to stand 2 minutes with supervision   Sitting with Back Unsupported but Feet Supported on Floor or Stool Able to sit safely and securely 2 minutes   Stand to Sit Sits  safely with minimal use of hands   Transfers Able to transfer safely, definite need of hands   Standing Unsupported with Eyes Closed Able to stand 10 seconds with supervision   Standing Ubsupported with Feet Together Able to place feet together independently and stand for 1 minute with supervision   From Standing, Reach Forward with Outstretched Arm Reaches forward but needs supervision   From Standing Position, Pick up Object from Floor Unable to try/needs assist to keep balance   From Standing Position, Turn to Look Behind Over each Shoulder Needs supervision when turning   Turn 360 Degrees Needs assistance while turning   Standing Unsupported, Alternately Place Feet on Step/Stool Needs assistance to keep from falling or unable to try   Standing Unsupported, One Foot in Front Able to take small step independently and hold 30 seconds   Standing on One Leg Unable to try or needs assist to prevent fall   Total Score 27     Lumbar Exercises: Stretches   Single Knee to Chest Stretch 1 rep   Single Knee to Chest Stretch Limitations pt independent with use of handout only   Pelvic Tilt 5 reps   Pelvic Tilt Limitations pt initially lying supine with arms across abdomen (as in picture on exercise handout) and thought that was all there was to the exercise; educated on pelvic tilts and pt remembered quickly how to do them     Lumbar Exercises: Supine   Bridge 10 reps   Bridge Limitations no cues needed     Lumbar Exercises: Sidelying   Clam 5 reps   Clam Limitations pt reporting back strain and denies feeling muscles in hip fatigue; changed to doing in supine and pt reported no further back pain     Knee/Hip Exercises: Seated   Long Arc Quad Both   Long Arc Quad Limitations demonstrated correct technique   Sit to [employer] 5 reps  correct technique, still has to lightly use UEs     Ankle Exercises: Seated   Heel Raises 10 reps   Toe Raise 10 reps  no movement on Lt, slight toe extension on Rt                 PT Education - 06/09/17 1314    Education provided Yes   Education Details results of STG assessment   Person(s) Educated Patient   Methods Explanation   Comprehension Verbalized understanding          PT Short Term Goals - 06/09/17 1317      PT SHORT TERM GOAL #1   Title Pt will be independent with HEP for imrpoved strength, balance, and gait.  TARGET 04/11/17; TARGET updated to 04/30/17 due to missed sessions/illness: TARGET for all STGs 06/11/17   Baseline 7/11 pt  required assist/cues with one out of 6 exercises   Time 4   Period Weeks   Status Partially Met     PT SHORT TERM GOAL #2   Title Pt will perform sit<>stand transfers, at least 4 of 5 trials, with minimal UE support, modified independently for improved transfer efficiency and safety.   Baseline 6/15 required mod assist of UEs   Time 4   Period Weeks   Status Achieved     PT SHORT TERM GOAL #3   Title Pt will improve TUG score to less than or equal to 19 seconds for decreased fall risk.   Baseline 6/15 16.68   Time 4   Period Weeks   Status Achieved     PT SHORT TERM GOAL #4   Title Pt will improve Berg Balance score to at least 27/56 for decreased fall risk.   Baseline 6/15 23/56; 7/11 27/56   Time 4   Period Weeks   Status Achieved     PT SHORT TERM GOAL #5   Title ---   Baseline ---           PT Long Term Goals - 05/14/17 2249      PT LONG TERM GOAL #1   Title Pt/caregiver ([name]), will verbalize understanding of fall prevention within the home environmen.t  TARGET 05/11/17; TARGET for all LTGs 07/09/17   Time 8   Period Weeks   Status On-going     PT LONG TERM GOAL #2   Title Pt will improve TUG score to less than or equal to 16 seconds for decreased fall risk. 6/15 updated to <=14 sec.    Time 8   Period Weeks   Status On-going     PT LONG TERM GOAL #3   Title Pt will improve Berg BAlance score to at least 32/56 for decreased fall risk.  05/14/17 updated to  30/56   Time 8   Period Weeks   Status On-going     PT LONG TERM GOAL #4   Title Pt will improve gait velocity to at least 2.4 ft/sec for improved gait efficiency and safety with household and community gait.     Time 8   Period Weeks   Status On-going     PT LONG TERM GOAL #5   Title Pt/caregiver will verbalize plans for continued fitness post D/C from PT.   Time 8   Period Weeks   Status On-going               Plan - 06/09/17 1316    Clinical Impression Statement STGs assessed with pt meeting 3 of 4 goals (4th goal was partially met, she needed cues/assist with one of her home exercises). Patient very reluctant to attempt several items on the [facility] assessment, which in part showed her safety awareness. Will continue PT towards LTGs and overall goal of reducing her risk of falling.    Rehab Potential Good   PT Frequency 2x / week   PT Duration 8 weeks   PT Treatment/Interventions ADLs/Self Care Home Management;Functional mobility training;Gait training;DME Instruction;Therapeutic activities;Therapeutic exercise;Balance training;Neuromuscular re-education;Patient/family education;Orthotic Fit/Training   PT Next Visit Plan Per [facility] request, continue reaching/dynamic activities while using RW to simulate reaching tasks she does in the kitchen for simple meal prep; gait training with knee extension in stance phase, full hip extension-retro gait, tandem gait, Lt hip flexor stretching, decreasing UE dependence with transfers-   Consulted and Agree with Plan of Care Patient;Family member/caregiver  Family Member Consulted Vie-friend      Patient will benefit from skilled therapeutic intervention in order to improve the following deficits and impairments:  Abnormal gait, Decreased balance, Decreased mobility, Decreased safety awareness, Decreased range of motion, Decreased strength, Impaired flexibility, Postural dysfunction  Visit Diagnosis: Other abnormalities of gait and  mobility  Unsteadiness on feet     Problem List Patient Active Problem List   Diagnosis Date Noted  . Other osteoporosis with current pathological fracture   . Chronic midline low back pain without sciatica   . Thrombus   . Closed nondisplaced fracture of pelvis with routine healing   . Pelvic ring fracture ([place])   . Osteoporosis with current pathological fracture   . Slow transit constipation   . Fractures   . Pain   . Generalized anxiety disorder   . Benign essential HTN   . Hypoalbuminemia due to protein-calorie malnutrition ([place])   . Thrombocytopenia ([place])   . Pulmonary hypertension ([place])   . Age-related osteoporosis with current pathological fracture   . Pelvic fracture ([place]) 12/26/2016  . Multiple closed fractures of pelvis with stable disruption of pelvic circle (HCC)   . Memory loss   . Lumbar burst fracture ([place]) 06/08/2016  . Lumbar compression fracture ([place]) 06/08/2016  . Bilateral foot-drop   . Midline thoracic back pain   . A-fib ([place])   . Depression   . Constipation due to pain medication   . Chronic constipation   . Compression fracture of L2 ([place]) 05/30/2016  . Elevated blood pressure 05/30/2016  . L2 vertebral fracture ([place]) 05/30/2016  . Anxiety state 12/10/2014  . Neurogenic bladder 11/30/2014  . Neurogenic bowel 11/30/2014  . Bacterial UTI 11/30/2014  . Thoracic myelopathy 11/29/2014  . Persistent atrial fibrillation ([place]) 11/27/2014  . Traumatic spinal subdural hematoma   . Acute pulmonary embolism ([place])   . Orthostatic hypotension 11/22/2014  . Near syncope 11/21/2014  . Paraplegia at T9 level ([place]) 11/17/2014  . Weakness of both legs   . Nocturnal leg cramps 07/16/2014  . Encounter for therapeutic drug monitoring 12/25/2013  . Malaise and fatigue 06/01/2011  . Atrial flutter ([place]) 03/10/2011  . Long term (current) use of anticoagulants 03/10/2011  . Benign hypertensive heart disease without heart failure 03/10/2011  . HLD  (hyperlipidemia) 03/10/2011  . Paroxysmal atrial flutter ([place])   . Palpitations   . PAC (premature atrial contraction)   . PVC's (premature ventricular contractions)   . Malaise   . Fatigue   . Myalgia     Rexanne Mano, PT 06/09/2017, 1:29 PM  [facility] [address] [place], [place], [address] Phone: [phone]   Fax:  [phone]  Name: DEANETTE TULLIUS MRN: [id number] Date of Birth: Apr 19, 1931

## 2017-06-10 ENCOUNTER — Ambulatory Visit: Payer: Medicare Other | Admitting: Physical Therapy

## 2017-06-15 ENCOUNTER — Ambulatory Visit: Payer: Medicare Other | Admitting: Physical Therapy

## 2017-06-16 ENCOUNTER — Ambulatory Visit: Payer: Medicare Other | Admitting: Physical Therapy

## 2017-06-16 ENCOUNTER — Encounter: Payer: Self-pay | Admitting: Physical Therapy

## 2017-06-16 DIAGNOSIS — R293 Abnormal posture: Secondary | ICD-10-CM

## 2017-06-16 DIAGNOSIS — R2689 Other abnormalities of gait and mobility: Secondary | ICD-10-CM

## 2017-06-16 DIAGNOSIS — M6281 Muscle weakness (generalized): Secondary | ICD-10-CM

## 2017-06-16 DIAGNOSIS — R2681 Unsteadiness on feet: Secondary | ICD-10-CM | POA: Diagnosis not present

## 2017-06-16 NOTE — Therapy (Signed)
[facility] [address] [place], [place], [address] Phone: [phone]   Fax:  [phone]  Physical Therapy Treatment  Patient Details  Name: [name] Weber MRN: [id number] Date of Birth: 10-Jun-1931 Referring Provider: Naaman Plummer  Encounter Date: 06/16/2017      PT End of Session - 06/16/17 1412    Visit Number 15  [place]   Number of Visits 33  recert 8/46   Date for PT Re-Evaluation 07/13/17   Authorization Type UHC Medicare-GCODE every 10th visit   PT Start Time 0935  pt arrived late   PT Stop Time 1015   PT Time Calculation (min) 40 min   Equipment Utilized During Treatment Gait belt   Activity Tolerance Patient tolerated treatment well   Behavior During Therapy [facility] for tasks assessed/performed      Past Medical History:  Diagnosis Date  . Fatigue   . Malaise   . Myalgia   . PAC (premature atrial contraction)    ISOLATED  . Palpitations    OCCASSIONAL  . Paroxysmal atrial flutter ([place])   . PVC's (premature ventricular contractions)    ISOLATED    Past Surgical History:  Procedure Laterality Date  . THORACIC LAMINECTOMY FOR EPIDURAL ABSCESS N/A 11/16/2014   Procedure: THORACIC LAMINECTOMY FOR EPIDURAL ABSCESS;  Surgeon: Floyce Stakes, MD;  Location: [facility];  Service: Neurosurgery;  Laterality: N/A;  . VULVAR LESION REMOVAL  01/01/2009   She had a 1-cm area of erythema to the right of the urethral meatus    There were no vitals filed for this visit.      Subjective Assessment - 06/16/17 0937    Subjective Ankle is better. no questions or concerns   Patient is accompained by: Family member   Pertinent History R pelvic fracture 12/25/16, T9 paraplegia, history of compression fractures; unable to wear AFOs currently due to hammer toe deformity L >R foot; blister on R heel (healed), recent UTI   Patient Stated Goals Pt's goal is for guidance for how to continue to get stronger.   Currently in  Pain? No/denies   Pain Onset Yesterday                         [facility] Adult PT Treatment/Exercise - 06/16/17 1403      Bed Mobility   Bed Mobility Supine to Sit;Sit to Supine   Supine to Sit 6: Modified independent (Device/Increase time)   Sit to Supine 6: Modified independent (Device/Increase time)     Transfers   Transfers Sit to Stand;Stand to Sit   Sit to Stand 6: Modified independent (Device/Increase time);With upper extremity assist   Sit to Stand Details (indicate cue type and reason) no cues needed for safe use of RW   Stand to Sit 6: Modified independent (Device/Increase time);4: Min assist;Uncontrolled descent   Stand to Sit Details very light min assist at times to wt-shift forward to allow pt to control descent   Number of Reps 2 sets  15, 10   Comments from elevated mat table to allow pt to maximally use legs and minimally use 1 UE to assist; second set with 2" block under one foot x 5,  then switch block to other foot x 5     Ambulation/Gait   Ambulation/Gait Assistance 5: Supervision   Ambulation/Gait Assistance Details walking into clinic with pt maintinaing more upright posture and closer to her RW; after stepping exercises, able to demonstrate incr  stride length   Ambulation Distance (Feet) 120 Feet  120, 120   Assistive device Rolling walker   Gait Pattern Step-through pattern;Decreased step length - right;Decreased step length - left;Decreased dorsiflexion - right;Decreased dorsiflexion - left;Right flexed knee in stance;Left flexed knee in stance;Wide base of support;Trunk flexed;Right steppage;Left steppage;Right foot flat;Left foot flat   Ambulation Surface Level;Indoor     Lumbar Exercises: Stretches   Passive Hamstring Stretch 1 rep;30 seconds  supine;      Lumbar Exercises: Supine   Bridge 10 reps   Bridge Limitations 2 sets with towel roll between knees for pt to squeeze while lifting     Knee/Hip Exercises: Standing   Step Down Both;1  set;10 reps   Step Down Limitations step down off 6" step and immediately step back up (stepping backwards)             Balance Exercises - 06/16/17 1411      Balance Exercises: Standing   Standing Eyes Opened Narrow base of support (BOS);Wide (BOA);Head turns;Foam/compliant surface  head turns x 10; standing on 2 pieces black foam beams (hori   Stepping Strategy Anterior;Lateral;UE support;10 reps  refused to attempt without holding onto RW             PT Short Term Goals - 06/09/17 1317      PT SHORT TERM GOAL #1   Title Pt will be independent with HEP for imrpoved strength, balance, and gait.  TARGET 04/11/17; TARGET updated to 04/30/17 due to missed sessions/illness: TARGET for all STGs 06/11/17   Baseline 7/11 pt required assist/cues with one out of 6 exercises   Time 4   Period Weeks   Status Partially Met     PT SHORT TERM GOAL #2   Title Pt will perform sit<>stand transfers, at least 4 of 5 trials, with minimal UE support, modified independently for improved transfer efficiency and safety.   Baseline 6/15 required mod assist of UEs   Time 4   Period Weeks   Status Achieved     PT SHORT TERM GOAL #3   Title Pt will improve TUG score to less than or equal to 19 seconds for decreased fall risk.   Baseline 6/15 16.68   Time 4   Period Weeks   Status Achieved     PT SHORT TERM GOAL #4   Title Pt will improve Berg Balance score to at least 27/56 for decreased fall risk.   Baseline 6/15 23/56; 7/11 27/56   Time 4   Period Weeks   Status Achieved     PT SHORT TERM GOAL #5   Title ---   Baseline ---           PT Long Term Goals - 05/14/17 2249      PT LONG TERM GOAL #1   Title Pt/caregiver ([name]), will verbalize understanding of fall prevention within the home environmen.t  TARGET 05/11/17; TARGET for all LTGs 07/09/17   Time 8   Period Weeks   Status On-going     PT LONG TERM GOAL #2   Title Pt will improve TUG score to less than or equal to  16 seconds for decreased fall risk. 6/15 updated to <=14 sec.    Time 8   Period Weeks   Status On-going     PT LONG TERM GOAL #3   Title Pt will improve Berg BAlance score to at least 32/56 for decreased fall risk.  05/14/17 updated to 30/56   Time  8   Period Weeks   Status On-going     PT LONG TERM GOAL #4   Title Pt will improve gait velocity to at least 2.4 ft/sec for improved gait efficiency and safety with household and community gait.     Time 8   Period Weeks   Status On-going     PT LONG TERM GOAL #5   Title Pt/caregiver will verbalize plans for continued fitness post D/C from PT.   Time 8   Period Weeks   Status On-going               Plan - 06/16/17 1415    Clinical Impression Statement Session focused on gait training for increased step length, upright posture, and LE strengthening. Patient tolerated increased activity today more than previous visits. Will continue to benefit from PT.    Rehab Potential Good   PT Frequency 2x / week   PT Duration 8 weeks   PT Treatment/Interventions ADLs/Self Care Home Management;Functional mobility training;Gait training;DME Instruction;Therapeutic activities;Therapeutic exercise;Balance training;Neuromuscular re-education;Patient/family education;Orthotic Fit/Training   PT Next Visit Plan Per [facility] request, continue reaching/dynamic activities while using RW to simulate reaching tasks she does in the kitchen for simple meal prep; gait training with knee extension in stance phase, full hip extension-retro gait, tandem gait, Lt hip flexor stretching, decreasing UE dependence with transfers-   Consulted and Agree with Plan of Care Patient;Family member/caregiver   Family Member Consulted Vie-friend      Patient will benefit from skilled therapeutic intervention in order to improve the following deficits and impairments:  Abnormal gait, Decreased balance, Decreased mobility, Decreased safety awareness, Decreased range of motion,  Decreased strength, Impaired flexibility, Postural dysfunction  Visit Diagnosis: Other abnormalities of gait and mobility  Muscle weakness (generalized)  Abnormal posture  Unsteadiness on feet     Problem List Patient Active Problem List   Diagnosis Date Noted  . Other osteoporosis with current pathological fracture   . Chronic midline low back pain without sciatica   . Thrombus   . Closed nondisplaced fracture of pelvis with routine healing   . Pelvic ring fracture ([place])   . Osteoporosis with current pathological fracture   . Slow transit constipation   . Fractures   . Pain   . Generalized anxiety disorder   . Benign essential HTN   . Hypoalbuminemia due to protein-calorie malnutrition ([place])   . Thrombocytopenia ([place])   . Pulmonary hypertension ([place])   . Age-related osteoporosis with current pathological fracture   . Pelvic fracture ([place]) 12/26/2016  . Multiple closed fractures of pelvis with stable disruption of pelvic circle (HCC)   . Memory loss   . Lumbar burst fracture ([place]) 06/08/2016  . Lumbar compression fracture ([place]) 06/08/2016  . Bilateral foot-drop   . Midline thoracic back pain   . A-fib ([place])   . Depression   . Constipation due to pain medication   . Chronic constipation   . Compression fracture of L2 ([place]) 05/30/2016  . Elevated blood pressure 05/30/2016  . L2 vertebral fracture ([place]) 05/30/2016  . Anxiety state 12/10/2014  . Neurogenic bladder 11/30/2014  . Neurogenic bowel 11/30/2014  . Bacterial UTI 11/30/2014  . Thoracic myelopathy 11/29/2014  . Persistent atrial fibrillation ([place]) 11/27/2014  . Traumatic spinal subdural hematoma   . Acute pulmonary embolism ([place])   . Orthostatic hypotension 11/22/2014  . Near syncope 11/21/2014  . Paraplegia at T9 level ([place]) 11/17/2014  . Weakness of both legs   . Nocturnal leg  cramps 07/16/2014  . Encounter for therapeutic drug monitoring 12/25/2013  . Malaise and fatigue 06/01/2011  . Atrial flutter  ([place]) 03/10/2011  . Long term (current) use of anticoagulants 03/10/2011  . Benign hypertensive heart disease without heart failure 03/10/2011  . HLD (hyperlipidemia) 03/10/2011  . Paroxysmal atrial flutter ([place])   . Palpitations   . PAC (premature atrial contraction)   . PVC's (premature ventricular contractions)   . Malaise   . Fatigue   . Myalgia     Rexanne Mano, PT 06/16/2017, 2:19 PM  [facility] [address] [place], [place], [address] Phone: [phone]   Fax:  [phone]  Name: [name] Weber MRN: [id number] Date of Birth: October 06, 1931

## 2017-06-17 ENCOUNTER — Ambulatory Visit: Payer: Medicare Other | Admitting: Physical Therapy

## 2017-06-17 DIAGNOSIS — R2681 Unsteadiness on feet: Secondary | ICD-10-CM

## 2017-06-17 DIAGNOSIS — M6281 Muscle weakness (generalized): Secondary | ICD-10-CM

## 2017-06-17 DIAGNOSIS — R2689 Other abnormalities of gait and mobility: Secondary | ICD-10-CM

## 2017-06-19 ENCOUNTER — Encounter: Payer: Self-pay | Admitting: Physical Therapy

## 2017-06-19 NOTE — Therapy (Signed)
[facility] [address] [place], [place], [address] Phone: [phone]   Fax:  [phone]  Physical Therapy Treatment  Patient Details  Name: [name] Weber MRN: [id number] Date of Birth: 02-03-1931 Referring Provider: Naaman Plummer  Encounter Date: 06/17/2017      PT End of Session - 06/19/17 0844    Visit Number 19  G9   Number of Visits 33  recert 7/90   Date for PT Re-Evaluation 07/13/17   Authorization Type UHC Medicare-GCODE every 10th visit   PT Start Time 1315   PT Stop Time 1358   PT Time Calculation (min) 43 min   Equipment Utilized During Treatment Gait belt   Activity Tolerance Patient tolerated treatment well   Behavior During Therapy WFL for tasks assessed/performed      Past Medical History:  Diagnosis Date  . Fatigue   . Malaise   . Myalgia   . PAC (premature atrial contraction)    ISOLATED  . Palpitations    OCCASSIONAL  . Paroxysmal atrial flutter ([place])   . PVC's (premature ventricular contractions)    ISOLATED    Past Surgical History:  Procedure Laterality Date  . THORACIC LAMINECTOMY FOR EPIDURAL ABSCESS N/A 11/16/2014   Procedure: THORACIC LAMINECTOMY FOR EPIDURAL ABSCESS;  Surgeon: Floyce Stakes, MD;  Location: [facility];  Service: Neurosurgery;  Laterality: N/A;  . VULVAR LESION REMOVAL  01/01/2009   She had a 1-cm area of erythema to the right of the urethral meatus    There were no vitals filed for this visit.      Subjective Assessment - 06/19/17 0830    Subjective Ankle is better. no questions or concerns. Her goal remains to walk without a device.    Patient is accompained by: Family member   Pertinent History R pelvic fracture 12/25/16, T9 paraplegia, history of compression fractures; unable to wear AFOs currently due to hammer toe deformity L >R foot; blister on R heel (healed), recent UTI   Patient Stated Goals Pt's goal is for guidance for how to continue to  get stronger.   Currently in Pain? No/denies   Pain Onset Yesterday                         [facility] Adult PT Treatment/Exercise - 06/19/17 0001      Transfers   Transfers Sit to Stand;Stand to Sit   Sit to Stand 6: Modified independent (Device/Increase time)   Stand to Sit 6: Modified independent (Device/Increase time)     Ambulation/Gait   Ambulation/Gait Assistance 5: Supervision   Ambulation/Gait Assistance Details proximity to RW not as good as yesterday and required vc to correct ~75% of the time she was walking; when cued she can demonstrate excellent upright posture and incr ease advancing her feet, however with RW too far ahead, she flexes her trunk and increases the height of her steppage gait   Ambulation Distance (Feet) 120 Feet  x 3   Assistive device Rolling walker   Gait Pattern Step-through pattern;Decreased step length - right;Decreased step length - left;Decreased dorsiflexion - right;Decreased dorsiflexion - left;Right flexed knee in stance;Left flexed knee in stance;Wide base of support;Trunk flexed;Right steppage;Left steppage;Right foot flat;Left foot flat   Ambulation Surface Level;Indoor             Balance Exercises - 06/19/17 0837      Balance Exercises: Standing   SLS Eyes open;Upper extremity support 1;3 reps  each  leg; pt fearful to attempt without at least 1 finger    Rockerboard Anterior/posterior;Lateral;Head turns;EO;EC;Intermittent UE support  limited ability with EC   Step Ups Forward;UE support 2  10 each leg   Other Standing Exercises in //bars on blue mat; reaching down to stool for cones, shifting weight laterally (and progressing to sidestep) as if at kitchen counter to place cone on counter height table; repeated x 20 lt and then rt             PT Short Term Goals - 06/09/17 1317      PT SHORT TERM GOAL #1   Title Pt will be independent with HEP for imrpoved strength, balance, and gait.  TARGET 04/11/17; TARGET  updated to 04/30/17 due to missed sessions/illness: TARGET for all STGs 06/11/17   Baseline 7/11 pt required assist/cues with one out of 6 exercises   Time 4   Period Weeks   Status Partially Met     PT SHORT TERM GOAL #2   Title Pt will perform sit<>stand transfers, at least 4 of 5 trials, with minimal UE support, modified independently for improved transfer efficiency and safety.   Baseline 6/15 required mod assist of UEs   Time 4   Period Weeks   Status Achieved     PT SHORT TERM GOAL #3   Title Pt will improve TUG score to less than or equal to 19 seconds for decreased fall risk.   Baseline 6/15 16.68   Time 4   Period Weeks   Status Achieved     PT SHORT TERM GOAL #4   Title Pt will improve Berg Balance score to at least 27/56 for decreased fall risk.   Baseline 6/15 23/56; 7/11 27/56   Time 4   Period Weeks   Status Achieved     PT SHORT TERM GOAL #5   Title ---   Baseline ---           PT Long Term Goals - 05/14/17 2249      PT LONG TERM GOAL #1   Title Pt/caregiver ([name]), will verbalize understanding of fall prevention within the home environmen.t  TARGET 05/11/17; TARGET for all LTGs 07/09/17   Time 8   Period Weeks   Status On-going     PT LONG TERM GOAL #2   Title Pt will improve TUG score to less than or equal to 16 seconds for decreased fall risk. 6/15 updated to <=14 sec.    Time 8   Period Weeks   Status On-going     PT LONG TERM GOAL #3   Title Pt will improve Berg BAlance score to at least 32/56 for decreased fall risk.  05/14/17 updated to 30/56   Time 8   Period Weeks   Status On-going     PT LONG TERM GOAL #4   Title Pt will improve gait velocity to at least 2.4 ft/sec for improved gait efficiency and safety with household and community gait.     Time 8   Period Weeks   Status On-going     PT LONG TERM GOAL #5   Title Pt/caregiver will verbalize plans for continued fitness post D/C from PT.   Time 8   Period Weeks   Status  On-going               Plan - 06/19/17 3500    Clinical Impression Statement Session focused on balance and gait training. Pt remains very fearful of falling and  requires encouragement to try to attempt tasks she is uncomfortable with while in safe setting of PT in order to challenge and improve her balance. Began discussion of plan for post-discharge continued activity. Patient reports she likes to walk in the pool (does not really like water aerobics) and her friend notes [facility] because [facility]. Informed of [facility] and pool available  and provided handout with information. Her friend, Noni Saupe, plans to look into it.    Rehab Potential Good   PT Frequency 2x / week   PT Duration 8 weeks   PT Treatment/Interventions ADLs/Self Care Home Management;Functional mobility training;Gait training;DME Instruction;Therapeutic activities;Therapeutic exercise;Balance training;Neuromuscular re-education;Patient/family education;Orthotic Fit/Training   PT Next Visit Plan do G-CODE; ?try Nu-step as they may go to [employer]; Per [employer] request, continue reaching/dynamic activities while using RW to simulate reaching tasks she does in the kitchen for simple meal prep; gait training with knee extension in stance phase, full hip extension-retro gait, tandem gait, Lt hip flexor stretching, decreasing UE dependence with transfers-   Consulted and Agree with Plan of Care Patient;Family member/caregiver   Family Member Consulted Vie-friend      Patient will benefit from skilled therapeutic intervention in order to improve the following deficits and impairments:  Abnormal gait, Decreased balance, Decreased mobility, Decreased safety awareness, Decreased range of motion, Decreased strength, Impaired flexibility, Postural dysfunction  Visit Diagnosis: Other abnormalities of gait and mobility  Muscle weakness (generalized)  Unsteadiness  on feet     Problem List Patient Active Problem List   Diagnosis Date Noted  . Other osteoporosis with current pathological fracture   . Chronic midline low back pain without sciatica   . Thrombus   . Closed nondisplaced fracture of pelvis with routine healing   . Pelvic ring fracture ([place])   . Osteoporosis with current pathological fracture   . Slow transit constipation   . Fractures   . Pain   . Generalized anxiety disorder   . Benign essential HTN   . Hypoalbuminemia due to protein-calorie malnutrition ([place])   . Thrombocytopenia ([place])   . Pulmonary hypertension ([place])   . Age-related osteoporosis with current pathological fracture   . Pelvic fracture ([place]) 12/26/2016  . Multiple closed fractures of pelvis with stable disruption of pelvic circle (HCC)   . Memory loss   . Lumbar burst fracture ([place]) 06/08/2016  . Lumbar compression fracture ([place]) 06/08/2016  . Bilateral foot-drop   . Midline thoracic back pain   . A-fib ([place])   . Depression   . Constipation due to pain medication   . Chronic constipation   . Compression fracture of L2 ([place]) 05/30/2016  . Elevated blood pressure 05/30/2016  . L2 vertebral fracture ([place]) 05/30/2016  . Anxiety state 12/10/2014  . Neurogenic bladder 11/30/2014  . Neurogenic bowel 11/30/2014  . Bacterial UTI 11/30/2014  . Thoracic myelopathy 11/29/2014  . Persistent atrial fibrillation ([place]) 11/27/2014  . Traumatic spinal subdural hematoma   . Acute pulmonary embolism ([place])   . Orthostatic hypotension 11/22/2014  . Near syncope 11/21/2014  . Paraplegia at T9 level ([place]) 11/17/2014  . Weakness of both legs   . Nocturnal leg cramps 07/16/2014  . Encounter for therapeutic drug monitoring 12/25/2013  . Malaise and fatigue 06/01/2011  . Atrial flutter ([place]) 03/10/2011  . Long term (current) use of anticoagulants 03/10/2011  . Benign hypertensive heart disease without heart failure 03/10/2011  .  HLD (hyperlipidemia) 03/10/2011  .  Paroxysmal atrial flutter ([place])   . Palpitations   . PAC (premature atrial contraction)   . PVC's (premature ventricular contractions)   . Malaise   . Fatigue   . Myalgia     Rexanne Mano. PT 06/19/2017, 8:54 AM  [facility] [address] [place], [place], [address] Phone: [phone]   Fax:  [phone]  Name: [name] Weber MRN: [id number] Date of Birth: [birth year]

## 2017-06-22 ENCOUNTER — Ambulatory Visit: Payer: Medicare Other | Admitting: Physical Therapy

## 2017-06-23 ENCOUNTER — Encounter: Payer: Self-pay | Admitting: Physical Therapy

## 2017-06-23 ENCOUNTER — Ambulatory Visit: Payer: Medicare Other | Admitting: Physical Therapy

## 2017-06-23 DIAGNOSIS — M6281 Muscle weakness (generalized): Secondary | ICD-10-CM

## 2017-06-23 DIAGNOSIS — R2681 Unsteadiness on feet: Secondary | ICD-10-CM

## 2017-06-23 NOTE — Therapy (Signed)
[facility] [address] [place], [place], [address] Phone: [phone]   Fax:  [phone]  Physical Therapy Treatment  Patient Details  Name: [name] Weber MRN: [id number] Date of Birth: 1931/01/18 Referring Provider: Naaman Plummer  Encounter Date: 06/23/2017      PT End of Session - 06/23/17 1209    Visit Number 20  G10 (done 7/25)   Number of Visits 33  recert 1/02   Date for PT Re-Evaluation 07/13/17   Authorization Type UHC Medicare-GCODE every 10th visit   PT Start Time 1104   PT Stop Time 1144   PT Time Calculation (min) 40 min   Equipment Utilized During Treatment Gait belt   Activity Tolerance Patient tolerated treatment well   Behavior During Therapy [facility] for tasks assessed/performed      Past Medical History:  Diagnosis Date  . Fatigue   . Malaise   . Myalgia   . PAC (premature atrial contraction)    ISOLATED  . Palpitations    OCCASSIONAL  . Paroxysmal atrial flutter ([place])   . PVC's (premature ventricular contractions)    ISOLATED    Past Surgical History:  Procedure Laterality Date  . THORACIC LAMINECTOMY FOR EPIDURAL ABSCESS N/A 11/16/2014   Procedure: THORACIC LAMINECTOMY FOR EPIDURAL ABSCESS;  Surgeon: Floyce Stakes, MD;  Location: [facility];  Service: Neurosurgery;  Laterality: N/A;  . VULVAR LESION REMOVAL  01/01/2009   She had a 1-cm area of erythema to the right of the urethral meatus    There were no vitals filed for this visit.      Subjective Assessment - 06/23/17 1103    Subjective I just wanted to stay at home today.    Patient is accompained by: Family member   Pertinent History R pelvic fracture 12/25/16, T9 paraplegia, history of compression fractures; unable to wear AFOs currently due to hammer toe deformity L >R foot; blister on R heel (healed), recent UTI   Patient Stated Goals Pt's goal is for guidance for how to continue to get stronger.   Currently in Pain?  Yes   Pain Score 1    Pain Location Back   Pain Orientation Lower   Pain Descriptors / Indicators Aching   Pain Type Chronic pain   Pain Onset Yesterday   Pain Frequency Intermittent                         [facility] Adult PT Treatment/Exercise - 06/23/17 0001      Transfers   Transfers Sit to Stand   Sit to Stand 6: Modified independent (Device/Increase time);With upper extremity assist  armed chair sitting at dining table   Stand to Sit 6: Modified independent (Device/Increase time)  armed chair standing from dining table     Ambulation/Gait   Ambulation/Gait Assistance 5: Supervision   Ambulation/Gait Assistance Details better proximity to RW; vc for upright posyture and to look ahead   Ambulation Distance (Feet) 100 Feet  120, 40, 100, 100   Assistive device Rolling walker   Gait Pattern Step-through pattern;Decreased step length - right;Decreased step length - left;Decreased dorsiflexion - right;Decreased dorsiflexion - left;Right flexed knee in stance;Left flexed knee in stance;Wide base of support;Trunk flexed;Right steppage;Left steppage;Right foot flat;Left foot flat   Ambulation Surface Level;Indoor   Ramp 5: Supervision   Ramp Details (indicate cue type and reason) appropriate wt-shift for ascent and descent; very small steps descending   Curb 5: Supervision  Curb Details (indicate cue type and reason) tends to put RW too far ahead when transitioning it up/down     Dynamic Standing Balance   Dynamic Standing - Balance Support Right upper extremity supported;Left upper extremity supported;During functional activity   Dynamic Standing - Level of Assistance 5: Stand by assistance   Dynamic Standing - Balance Activities Lateral lean/weight shifting;Reaching for objects;Compliant surfaces   Reaching for objects comments: removing/placing dishes in dishwasher with reaching low; reaching high to bring down aluminum pans and lateraly shift with exchange of object  to other hand and place in sink (and reverse); opening low drawers putting objects away   Compliant surfaces comments: blue mat     Knee/Hip Exercises: Aerobic   Nustep L5 x      Knee/Hip Exercises: Machines for Strengthening   Cybex Leg Press 40# bil legs x 10 reps             Balance Exercises - 06/23/17 1207      Balance Exercises: Standing   Standing Eyes Opened Wide (BOA);Foam/compliant surface; single hand or finger support with low marching   Standing Eyes Closed Wide (BOA);Foam/compliant surface;20 secs  max   Wall Bumps Hip   Wall Bumps-Hips Eyes opened;Anterior/posterior;Foam/compliant surface;10 reps   Retro Gait Upper extremity support;4 reps  blue mat at counter   Sidestepping Foam/compliant support;Upper extremity support;4 reps  blue mat at counter   Marching Limitations forward with 2 sec hold on blue mat with single UE support on counter             PT Short Term Goals - 06/09/17 1317      PT SHORT TERM GOAL #1   Title Pt will be independent with HEP for imrpoved strength, balance, and gait.  TARGET 04/11/17; TARGET updated to 04/30/17 due to missed sessions/illness: TARGET for all STGs 06/11/17   Baseline 7/11 pt required assist/cues with one out of 6 exercises   Time 4   Period Weeks   Status Partially Met     PT SHORT TERM GOAL #2   Title Pt will perform sit<>stand transfers, at least 4 of 5 trials, with minimal UE support, modified independently for improved transfer efficiency and safety.   Baseline 6/15 required mod assist of UEs   Time 4   Period Weeks   Status Achieved     PT SHORT TERM GOAL #3   Title Pt will improve TUG score to less than or equal to 19 seconds for decreased fall risk.   Baseline 6/15 16.68   Time 4   Period Weeks   Status Achieved     PT SHORT TERM GOAL #4   Title Pt will improve Berg Balance score to at least 27/56 for decreased fall risk.   Baseline 6/15 23/56; 7/11 27/56   Time 4   Period Weeks   Status  Achieved     PT SHORT TERM GOAL #5   Title ---   Baseline ---           PT Long Term Goals - 05/14/17 2249      PT LONG TERM GOAL #1   Title Pt/caregiver ([name]), will verbalize understanding of fall prevention within the home environmen.t  TARGET 05/11/17; TARGET for all LTGs 07/09/17   Time 8   Period Weeks   Status On-going     PT LONG TERM GOAL #2   Title Pt will improve TUG score to less than or equal to 16 seconds for decreased fall risk.  6/15 updated to <=14 sec.    Time 8   Period Weeks   Status On-going     PT LONG TERM GOAL #3   Title Pt will improve Berg BAlance score to at least 32/56 for decreased fall risk.  05/14/17 updated to 30/56   Time 8   Period Weeks   Status On-going     PT LONG TERM GOAL #4   Title Pt will improve gait velocity to at least 2.4 ft/sec for improved gait efficiency and safety with household and community gait.     Time 8   Period Weeks   Status On-going     PT LONG TERM GOAL #5   Title Pt/caregiver will verbalize plans for continued fitness post D/C from PT.   Time 8   Period Weeks   Status On-going               Plan - 30-Jun-2017 1216    Clinical Impression Statement Session focused on balance on compliant surfaces and LE strengthening. Patient did very well with reaching activities in clinic kitchen and maintains one hand in contact with counter at all times. Vie not present to discuss post-discharge plan (?looked into [facility] pool). Will continue to benefit from balance training and strengthening.    Rehab Potential Good   PT Frequency 2x / week   PT Duration 8 weeks   PT Treatment/Interventions ADLs/Self Care Home Management;Functional mobility training;Gait training;DME Instruction;Therapeutic activities;Therapeutic exercise;Balance training;Neuromuscular re-education;Patient/family education;Orthotic Fit/Training   PT Next Visit Plan continue reaching/dynamic activities while using RW to simulate reaching tasks  she does at home; gait training with knee extension in stance phase, full hip extension-retro gait, tandem gait, Lt hip flexor stretching, decreasing UE dependence with transfers; Nu-step (L5) as they may go to [employer]; suggest ACT for post-discharge    Consulted and Agree with Plan of Care Patient;Family member/caregiver   Family Member Consulted caregiver      Patient will benefit from skilled therapeutic intervention in order to improve the following deficits and impairments:  Abnormal gait, Decreased balance, Decreased mobility, Decreased safety awareness, Decreased range of motion, Decreased strength, Impaired flexibility, Postural dysfunction  Visit Diagnosis: Unsteadiness on feet  Muscle weakness (generalized)       G-Codes - 06-30-17 02-03-27    Functional Assessment Tool Used (Outpatient Only) TUG 22.75; Berg 27/56   Functional Limitation Mobility: Walking and moving around   Mobility: Walking and Moving Around Current Status [phone]) At least 20 percent but less than 40 percent impaired, limited or restricted   Mobility: Walking and Moving Around Goal Status [phone]) At least 20 percent but less than 40 percent impaired, limited or restricted      Problem List Patient Active Problem List   Diagnosis Date Noted  . Other osteoporosis with current pathological fracture   . Chronic midline low back pain without sciatica   . Thrombus   . Closed nondisplaced fracture of pelvis with routine healing   . Pelvic ring fracture ([place])   . Osteoporosis with current pathological fracture   . Slow transit constipation   . Fractures   . Pain   . Generalized anxiety disorder   . Benign essential HTN   . Hypoalbuminemia due to protein-calorie malnutrition ([place])   . Thrombocytopenia ([place])   . Pulmonary hypertension ([place])   . Age-related osteoporosis with current pathological fracture   . Pelvic fracture ([place]) 12/26/2016  . Multiple closed fractures of pelvis with stable disruption of  pelvic  circle ([place])   . Memory loss   . Lumbar burst fracture ([place]) 06/08/2016  . Lumbar compression fracture ([place]) 06/08/2016  . Bilateral foot-drop   . Midline thoracic back pain   . A-fib ([place])   . Depression   . Constipation due to pain medication   . Chronic constipation   . Compression fracture of L2 ([place]) 05/30/2016  . Elevated blood pressure 05/30/2016  . L2 vertebral fracture ([place]) 05/30/2016  . Anxiety state 12/10/2014  . Neurogenic bladder 11/30/2014  . Neurogenic bowel 11/30/2014  . Bacterial UTI 11/30/2014  . Thoracic myelopathy 11/29/2014  . Persistent atrial fibrillation ([place]) 11/27/2014  . Traumatic spinal subdural hematoma   . Acute pulmonary embolism ([place])   . Orthostatic hypotension 11/22/2014  . Near syncope 11/21/2014  . Paraplegia at T9 level ([name]) 11/17/2014  . Weakness of both legs   . Nocturnal leg cramps 07/16/2014  . Encounter for therapeutic drug monitoring 12/25/2013  . Malaise and fatigue 06/01/2011  . Atrial flutter ([place]) 03/10/2011  . Long term (current) use of anticoagulants 03/10/2011  . Benign hypertensive heart disease without heart failure 03/10/2011  . HLD (hyperlipidemia) 03/10/2011  . Paroxysmal atrial flutter ([place])   . Palpitations   . PAC (premature atrial contraction)   . PVC's (premature ventricular contractions)   . Malaise   . Fatigue   . Myalgia    Physical Therapy Progress Note  Dates of Reporting Period: 05/14/2017 to 06/23/2017  Objective Reports of Subjective Statement: Patient reports she feels more comfortable doing simple cooking in her kitchen compared to several weeks ago.   Objective Measurements: Merrilee Jansky 27/56   Goal Update: NA  Plan: Continue to work towards [place]; have already begun discussions re: post-discharge activity plans  Reason Skilled Services are Required: Patient remains a high fall risk with Merrilee Jansky 27/56   Rexanne Mano, PT 06/23/2017, 12:29 PM  [facility] [address] [place], [place], [address] Phone: [phone]   Fax:  [phone]  Name: [name] Weber MRN: [id number] Date of Birth: [birth year]

## 2017-06-24 ENCOUNTER — Encounter: Payer: Self-pay | Admitting: Physical Therapy

## 2017-06-24 ENCOUNTER — Ambulatory Visit: Payer: Medicare Other | Admitting: Physical Therapy

## 2017-06-24 DIAGNOSIS — R2689 Other abnormalities of gait and mobility: Secondary | ICD-10-CM

## 2017-06-24 DIAGNOSIS — R2681 Unsteadiness on feet: Secondary | ICD-10-CM

## 2017-06-24 DIAGNOSIS — M6281 Muscle weakness (generalized): Secondary | ICD-10-CM

## 2017-06-24 NOTE — Therapy (Signed)
[name] Weber [address] [place], [place], [address] Phone: [phone]   Fax:  [phone]  Physical Therapy Treatment  Patient Details  Name: [name] Weber MRN: [id number] Date of Birth: [birth year] Referring Provider: Naaman Plummer  Encounter Date: 06/24/2017      PT End of Session - 06/24/17 2036    Visit Number 21  G1   Number of Visits 33  recert 0/17   Date for PT Re-Evaluation 07/13/17   Authorization Type UHC Medicare-GCODE every 10th visit   PT Start Time 1316   PT Stop Time 1358   PT Time Calculation (min) 42 min   Equipment Utilized During Treatment Gait belt   Activity Tolerance Patient tolerated treatment well   Behavior During Therapy WFL for tasks assessed/performed      Past Medical History:  Diagnosis Date  . Fatigue   . Malaise   . Myalgia   . PAC (premature atrial contraction)    ISOLATED  . Palpitations    OCCASSIONAL  . Paroxysmal atrial flutter ([place])   . PVC's (premature ventricular contractions)    ISOLATED    Past Surgical History:  Procedure Laterality Date  . THORACIC LAMINECTOMY FOR EPIDURAL ABSCESS N/A 11/16/2014   Procedure: THORACIC LAMINECTOMY FOR EPIDURAL ABSCESS;  Surgeon: Floyce Stakes, MD;  Location: [facility];  Service: Neurosurgery;  Laterality: N/A;  . VULVAR LESION REMOVAL  01/01/2009   She had a 1-cm area of erythema to the right of the urethral meatus    There were no vitals filed for this visit.      Subjective Assessment - 06/24/17 1315    Subjective Ready for another workout.    Patient is accompained by: Family member   Pertinent History R pelvic fracture 12/25/16, T9 paraplegia, history of compression fractures; unable to wear AFOs currently due to hammer toe deformity L >R foot; blister on R heel (healed), recent UTI   Patient Stated Goals Pt's goal is for guidance for how to continue to get stronger.   Currently in Pain? No/denies   Pain Onset  [facility] Adult PT Treatment/Exercise - 06/24/17 1325      Ambulation/Gait   Ambulation/Gait Assistance 5: Supervision   Ambulation/Gait Assistance Details continues to need cues to stay within frame of RW--however realized she uses a different RW in her home that has a tray across her handles which then causes her to walk behind her RW   Ambulation Distance (Feet) 100 Feet  120, 100   Assistive device Rolling walker   Gait Pattern Step-through pattern;Decreased step length - right;Decreased step length - left;Decreased dorsiflexion - right;Decreased dorsiflexion - left;Right flexed knee in stance;Left flexed knee in stance;Wide base of support;Trunk flexed;Right steppage;Left steppage;Right foot flat;Left foot flat   Ambulation Surface Level   Stairs Yes   Stairs Assistance 4: Min guard   Stairs Assistance Details (indicate cue type and reason) close guarding for safety   Stair Management Technique One rail Left;Two rails;Step to pattern;Sideways;Forwards   Number of Stairs 8   Height of Stairs 6   Ramp 5: Supervision   Ramp Details (indicate cue type and reason) for safety and because having pt stop on the ramp for balance activities   Curb 5: Supervision   Curb Details (indicate cue type and reason) better placement of RW   Gait Comments  Also performed gait in tight spaces and with tight turns with pt initially requiring vc's to prevent her feet/legs from becoming entangled with back legs of RW as she turns; by third pass through "obstacle course" she was able to maintain proximity to RW without cues     Knee/Hip Exercises: Aerobic   Nustep L5 x 5 minutes             Balance Exercises - 06/24/17 1400      Balance Exercises: Standing   Standing Eyes Opened Wide (BOA);Solid surface  no UE support   Standing Eyes Closed Wide (BOA);Solid surface  no UE support x 20 sec   Stepping Strategy Posterior;Lateral;UE support;10 reps    Step Ups Forward;Lateral;6 inch;UE support 1;UE support 2   Step Over Hurdles / Cones 6" hurdle with RW x 4 without difficulty   Heel Raises Limitations 1 finger support x 10   Other Standing Exercises in // bars with 1 finger support marching, then low kicks             PT Short Term Goals - 06/09/17 1317      PT SHORT TERM GOAL #1   Title Pt will be independent with HEP for imrpoved strength, balance, and gait.  TARGET 04/11/17; TARGET updated to 04/30/17 due to missed sessions/illness: TARGET for all STGs 06/11/17   Baseline 7/11 pt required assist/cues with one out of 6 exercises   Time 4   Period Weeks   Status Partially Met     PT SHORT TERM GOAL #2   Title Pt will perform sit<>stand transfers, at least 4 of 5 trials, with minimal UE support, modified independently for improved transfer efficiency and safety.   Baseline 6/15 required mod assist of UEs   Time 4   Period Weeks   Status Achieved     PT SHORT TERM GOAL #3   Title Pt will improve TUG score to less than or equal to 19 seconds for decreased fall risk.   Baseline 6/15 16.68   Time 4   Period Weeks   Status Achieved     PT SHORT TERM GOAL #4   Title Pt will improve Berg Balance score to at least 27/56 for decreased fall risk.   Baseline 6/15 23/56; 7/11 27/56   Time 4   Period Weeks   Status Achieved     PT SHORT TERM GOAL #5   Title ---   Baseline ---           PT Long Term Goals - 05/14/17 2249      PT LONG TERM GOAL #1   Title Pt/caregiver ([name]), will verbalize understanding of fall prevention within the home environmen.t  TARGET 05/11/17; TARGET for all LTGs 07/09/17   Time 8   Period Weeks   Status On-going     PT LONG TERM GOAL #2   Title Pt will improve TUG score to less than or equal to 16 seconds for decreased fall risk. 6/15 updated to <=14 sec.    Time 8   Period Weeks   Status On-going     PT LONG TERM GOAL #3   Title Pt will improve Berg BAlance score to at least 32/56 for  decreased fall risk.  05/14/17 updated to 30/56   Time 8   Period Weeks   Status On-going     PT LONG TERM GOAL #4   Title Pt will improve gait velocity to at least 2.4 ft/sec for improved gait efficiency  and safety with household and community gait.     Time 8   Period Weeks   Status On-going     PT LONG TERM GOAL #5   Title Pt/caregiver will verbalize plans for continued fitness post D/C from PT.   Time 8   Period Weeks   Status On-going               Plan - 06/24/17 2037    Clinical Impression Statement Session focused on gait training in tighter spaces (to simulate home environment), LE strengthening, and balance training. Her friend, Noni Saupe, present and plans to look into [facility] when she returns from vacation. Patient more willing to attempt balance tasks with less or no UE support and did very well.    Rehab Potential Good   PT Frequency 2x / week   PT Duration 8 weeks   PT Treatment/Interventions ADLs/Self Care Home Management;Functional mobility training;Gait training;DME Instruction;Therapeutic activities;Therapeutic exercise;Balance training;Neuromuscular re-education;Patient/family education;Orthotic Fit/Training   PT Next Visit Plan ? suggest ACT for post-discharge; Nu-step (L5) as they may go to [employer]; continue reaching/dynamic activities while using RW to simulate reaching tasks she does at home; gait training with knee extension in stance phase, full hip extension-retro gait, tandem gait, Lt hip flexor stretching, decreasing UE dependence with transfers    Consulted and Agree with Plan of Care Patient;Family member/caregiver   Family Member Consulted caregiver      Patient will benefit from skilled therapeutic intervention in order to improve the following deficits and impairments:  Abnormal gait, Decreased balance, Decreased mobility, Decreased safety awareness, Decreased range of motion, Decreased strength, Impaired flexibility, Postural  dysfunction  Visit Diagnosis: Unsteadiness on feet  Muscle weakness (generalized)  Other abnormalities of gait and mobility     Problem List Patient Active Problem List   Diagnosis Date Noted  . Other osteoporosis with current pathological fracture   . Chronic midline low back pain without sciatica   . Thrombus   . Closed nondisplaced fracture of pelvis with routine healing   . Pelvic ring fracture ([place])   . Osteoporosis with current pathological fracture   . Slow transit constipation   . Fractures   . Pain   . Generalized anxiety disorder   . Benign essential HTN   . Hypoalbuminemia due to protein-calorie malnutrition ([place])   . Thrombocytopenia ([place])   . Pulmonary hypertension ([place])   . Age-related osteoporosis with current pathological fracture   . Pelvic fracture ([place]) 12/26/2016  . Multiple closed fractures of pelvis with stable disruption of pelvic circle (HCC)   . Memory loss   . Lumbar burst fracture ([place]) 06/08/2016  . Lumbar compression fracture ([place]) 06/08/2016  . Bilateral foot-drop   . Midline thoracic back pain   . A-fib ([place])   . Depression   . Constipation due to pain medication   . Chronic constipation   . Compression fracture of L2 ([place]) 05/30/2016  . Elevated blood pressure 05/30/2016  . L2 vertebral fracture ([place]) 05/30/2016  . Anxiety state 12/10/2014  . Neurogenic bladder 11/30/2014  . Neurogenic bowel 11/30/2014  . Bacterial UTI 11/30/2014  . Thoracic myelopathy 11/29/2014  . Persistent atrial fibrillation ([place]) 11/27/2014  . Traumatic spinal subdural hematoma   . Acute pulmonary embolism ([place])   . Orthostatic hypotension 11/22/2014  . Near syncope 11/21/2014  . Paraplegia at T9 level ([place]) 11/17/2014  . Weakness of both legs   . Nocturnal leg cramps 07/16/2014  . Encounter for therapeutic drug  monitoring 12/25/2013  . Malaise and fatigue 06/01/2011  . Atrial flutter ([place]) 03/10/2011  . Long term (current) use of anticoagulants  03/10/2011  . Benign hypertensive heart disease without heart failure 03/10/2011  . HLD (hyperlipidemia) 03/10/2011  . Paroxysmal atrial flutter ([place])   . Palpitations   . PAC (premature atrial contraction)   . PVC's (premature ventricular contractions)   . Malaise   . Fatigue   . Myalgia     Rexanne Mano, PT 06/24/2017, 8:41 PM  [facility] [address] [place], [place], [address] Phone: [phone]   Fax:  [phone]  Name: ASHIMA SHRAKE MRN: [id number] Date of Birth: 1931-10-30

## 2017-06-29 ENCOUNTER — Ambulatory Visit: Payer: Medicare Other | Admitting: Physical Therapy

## 2017-06-30 ENCOUNTER — Ambulatory Visit: Payer: Medicare Other | Attending: Physical Medicine & Rehabilitation | Admitting: Physical Therapy

## 2017-06-30 DIAGNOSIS — R293 Abnormal posture: Secondary | ICD-10-CM | POA: Insufficient documentation

## 2017-06-30 DIAGNOSIS — R2689 Other abnormalities of gait and mobility: Secondary | ICD-10-CM | POA: Insufficient documentation

## 2017-06-30 DIAGNOSIS — M6281 Muscle weakness (generalized): Secondary | ICD-10-CM | POA: Insufficient documentation

## 2017-06-30 DIAGNOSIS — R2681 Unsteadiness on feet: Secondary | ICD-10-CM | POA: Insufficient documentation

## 2017-07-01 ENCOUNTER — Ambulatory Visit: Payer: Medicare Other | Admitting: Physical Therapy

## 2017-07-01 DIAGNOSIS — R2689 Other abnormalities of gait and mobility: Secondary | ICD-10-CM | POA: Diagnosis present

## 2017-07-01 DIAGNOSIS — R2681 Unsteadiness on feet: Secondary | ICD-10-CM

## 2017-07-01 DIAGNOSIS — M6281 Muscle weakness (generalized): Secondary | ICD-10-CM | POA: Diagnosis present

## 2017-07-01 DIAGNOSIS — R293 Abnormal posture: Secondary | ICD-10-CM | POA: Diagnosis present

## 2017-07-02 NOTE — Therapy (Signed)
[facility] [address] [place], [place], [address] Phone: [phone]   Fax:  [phone]  Physical Therapy Treatment  Patient Details  Name: [name] Weber MRN: [id number] Date of Birth: 30-Mar-1931 Referring Provider: Naaman Plummer  Encounter Date: 07/01/2017      PT End of Session - 07/02/17 0646    Visit Number 79  G1   Number of Visits 33  recert 7/86   Date for PT Re-Evaluation 07/13/17   Authorization Type UHC Medicare-GCODE every 10th visit   PT Start Time 1316   PT Stop Time 1402   PT Time Calculation (min) 46 min   Equipment Utilized During Treatment Gait belt   Activity Tolerance Patient tolerated treatment well   Behavior During Therapy [facility] for tasks assessed/performed      Past Medical History:  Diagnosis Date  . Fatigue   . Malaise   . Myalgia   . PAC (premature atrial contraction)    ISOLATED  . Palpitations    OCCASSIONAL  . Paroxysmal atrial flutter ([place])   . PVC's (premature ventricular contractions)    ISOLATED    Past Surgical History:  Procedure Laterality Date  . THORACIC LAMINECTOMY FOR EPIDURAL ABSCESS N/A 11/16/2014   Procedure: THORACIC LAMINECTOMY FOR EPIDURAL ABSCESS;  Surgeon: Floyce Stakes, MD;  Location: [facility];  Service: Neurosurgery;  Laterality: N/A;  . VULVAR LESION REMOVAL  01/01/2009   She had a 1-cm area of erythema to the right of the urethral meatus    There were no vitals filed for this visit.      Subjective Assessment - 07/01/17 1319    Subjective No pain, no changes since last visit.   Patient is accompained by: Family member   Pertinent History R pelvic fracture 12/25/16, T9 paraplegia, history of compression fractures; unable to wear AFOs currently due to hammer toe deformity L >R foot; blister on R heel (healed), recent UTI   Patient Stated Goals Pt's goal is for guidance for how to continue to get stronger.   Currently in Pain? No/denies    Pain Onset --                         [facility] Adult PT Treatment/Exercise - 07/02/17 0640      Knee/Hip Exercises: Aerobic   Nustep 4 extremities, Level 5 x 5 minutes     Knee/Hip Exercises: Machines for Strengthening   Cybex Leg Press 40# bil legs x 10 reps  cues for eccentric control             Balance Exercises - 07/01/17 1340      Balance Exercises: Standing   Standing Eyes Opened Wide (BOA);Narrow base of support (BOS);Foam/compliant surface;Head turns;5 reps  Head nods, 1-2 UE support at parallell bars   Standing Eyes Closed Wide (BOA);Narrow base of support (BOS);Foam/compliant surface;1 rep;10 secs  UE support   Tandem Stance Eyes open;Upper extremity support 1;Upper extremity support 2;2 reps;10 secs   Stepping Strategy Anterior;Lateral;Foam/compliant surface;10 reps;UE support   Step Ups Forward;Lateral;6 inch;UE support 2  15 reps forward; 10 reps lateral   Balance Beam Standing on blue balance beam:  marching in place x 20, alternating forward kicks x 20, alternating forward step taps x 20 reps with UE support   Tandem Gait Forward;Upper extremity support;4 reps   Retro Gait Upper extremity support;4 reps  parallel bars 1 UE support (forward/back)   Other Standing Exercises At 12" step:  foward step tap and hold, then back stretch into runner's stretch position x 10 reps each leg.             PT Short Term Goals - 06/09/17 1317      PT SHORT TERM GOAL #1   Title Pt will be independent with HEP for imrpoved strength, balance, and gait.  TARGET 04/11/17; TARGET updated to 04/30/17 due to missed sessions/illness: TARGET for all STGs 06/11/17   Baseline 7/11 pt required assist/cues with one out of 6 exercises   Time 4   Period Weeks   Status Partially Met     PT SHORT TERM GOAL #2   Title Pt will perform sit<>stand transfers, at least 4 of 5 trials, with minimal UE support, modified independently for improved transfer efficiency and safety.    Baseline 6/15 required mod assist of UEs   Time 4   Period Weeks   Status Achieved     PT SHORT TERM GOAL #3   Title Pt will improve TUG score to less than or equal to 19 seconds for decreased fall risk.   Baseline 6/15 16.68   Time 4   Period Weeks   Status Achieved     PT SHORT TERM GOAL #4   Title Pt will improve Berg Balance score to at least 27/56 for decreased fall risk.   Baseline 6/15 23/56; 7/11 27/56   Time 4   Period Weeks   Status Achieved     PT SHORT TERM GOAL #5   Title ---   Baseline ---           PT Long Term Goals - 05/14/17 2249      PT LONG TERM GOAL #1   Title Pt/caregiver ([name]), will verbalize understanding of fall prevention within the home environmen.t  TARGET 05/11/17; TARGET for all LTGs 07/09/17   Time 8   Period Weeks   Status On-going     PT LONG TERM GOAL #2   Title Pt will improve TUG score to less than or equal to 16 seconds for decreased fall risk. 6/15 updated to <=14 sec.    Time 8   Period Weeks   Status On-going     PT LONG TERM GOAL #3   Title Pt will improve Berg BAlance score to at least 32/56 for decreased fall risk.  05/14/17 updated to 30/56   Time 8   Period Weeks   Status On-going     PT LONG TERM GOAL #4   Title Pt will improve gait velocity to at least 2.4 ft/sec for improved gait efficiency and safety with household and community gait.     Time 8   Period Weeks   Status On-going     PT LONG TERM GOAL #5   Title Pt/caregiver will verbalize plans for continued fitness post D/C from PT.   Time 8   Period Weeks   Status On-going               Plan - 07/02/17 0646    Clinical Impression Statement Continued balance work today with focus on lessening UE support, which pt is able to do more so on solid surfaces than compliant.  Charlesetta Ivory is present and asks at end of session about patient doing more standing exercises at home, including stepups.  She tolerated strengthening machine activities well  today.  Will continue to benefit from skilled PT to address balance, strength, and gait, with plans for transition to community fitness.  Rehab Potential Good   PT Frequency 2x / week   PT Duration 8 weeks   PT Treatment/Interventions ADLs/Self Care Home Management;Functional mobility training;Gait training;DME Instruction;Therapeutic activities;Therapeutic exercise;Balance training;Neuromuscular re-education;Patient/family education;Orthotic Fit/Training   PT Next Visit Plan ? suggest ACT for post-discharge; Nu-step (L5) as they may go to [employer]; continue reaching/dynamic activities while using RW to simulate reaching tasks she does at home; gait training with knee extension in stance phase, full hip extension-retro gait, tandem gait, Lt hip flexor stretching, decreasing UE dependence with transfers   ?goals due next week?; also, check about stepups/step taps as part of HEP (when supervision is available)-AWM 8/2   Consulted and Agree with Plan of Care Patient;Family member/caregiver   Family Member Consulted caregiver      Patient will benefit from skilled therapeutic intervention in order to improve the following deficits and impairments:  Abnormal gait, Decreased balance, Decreased mobility, Decreased safety awareness, Decreased range of motion, Decreased strength, Impaired flexibility, Postural dysfunction  Visit Diagnosis: Unsteadiness on feet  Muscle weakness (generalized)     Problem List Patient Active Problem List   Diagnosis Date Noted  . Other osteoporosis with current pathological fracture   . Chronic midline low back pain without sciatica   . Thrombus   . Closed nondisplaced fracture of pelvis with routine healing   . Pelvic ring fracture ([place])   . Osteoporosis with current pathological fracture   . Slow transit constipation   . Fractures   . Pain   . Generalized anxiety disorder   . Benign essential HTN   . Hypoalbuminemia due to protein-calorie malnutrition  ([place])   . Thrombocytopenia ([place])   . Pulmonary hypertension ([place])   . Age-related osteoporosis with current pathological fracture   . Pelvic fracture ([place]) 12/26/2016  . Multiple closed fractures of pelvis with stable disruption of pelvic circle (HCC)   . Memory loss   . Lumbar burst fracture ([place]) 06/08/2016  . Lumbar compression fracture ([place]) 06/08/2016  . Bilateral foot-drop   . Midline thoracic back pain   . A-fib ([place])   . Depression   . Constipation due to pain medication   . Chronic constipation   . Compression fracture of L2 ([place]) 05/30/2016  . Elevated blood pressure 05/30/2016  . L2 vertebral fracture ([place]) 05/30/2016  . Anxiety state 12/10/2014  . Neurogenic bladder 11/30/2014  . Neurogenic bowel 11/30/2014  . Bacterial UTI 11/30/2014  . Thoracic myelopathy 11/29/2014  . Persistent atrial fibrillation ([place]) 11/27/2014  . Traumatic spinal subdural hematoma   . Acute pulmonary embolism ([place])   . Orthostatic hypotension 11/22/2014  . Near syncope 11/21/2014  . Paraplegia at T9 level ([place]) 11/17/2014  . Weakness of both legs   . Nocturnal leg cramps 07/16/2014  . Encounter for therapeutic drug monitoring 12/25/2013  . Malaise and fatigue 06/01/2011  . Atrial flutter ([place]) 03/10/2011  . Long term (current) use of anticoagulants 03/10/2011  . Benign hypertensive heart disease without heart failure 03/10/2011  . HLD (hyperlipidemia) 03/10/2011  . Paroxysmal atrial flutter ([place])   . Palpitations   . PAC (premature atrial contraction)   . PVC's (premature ventricular contractions)   . Malaise   . Fatigue   . Myalgia     MARRIOTT,AMY W. 07/02/2017, 6:51 AM Frazier Butt., PT  [facility] [address] [place], [place], [address] Phone: [phone]   Fax:  [phone]  Name: MYLENA SEDBERRY MRN: [id number] Date of Birth: 1931-08-10

## 2017-07-06 ENCOUNTER — Ambulatory Visit: Payer: Medicare Other | Admitting: Physical Therapy

## 2017-07-08 ENCOUNTER — Ambulatory Visit: Payer: Medicare Other | Admitting: Physical Therapy

## 2017-07-08 ENCOUNTER — Encounter: Payer: Self-pay | Admitting: Physical Therapy

## 2017-07-08 DIAGNOSIS — M6281 Muscle weakness (generalized): Secondary | ICD-10-CM

## 2017-07-08 DIAGNOSIS — R2689 Other abnormalities of gait and mobility: Secondary | ICD-10-CM

## 2017-07-08 DIAGNOSIS — R2681 Unsteadiness on feet: Secondary | ICD-10-CM

## 2017-07-08 NOTE — Therapy (Signed)
[facility] [address] [place], [place], [address] Phone: [phone]   Fax:  [phone]  Physical Therapy Treatment  Patient Details  Name: [name] Weber MRN: [id number] Date of Birth: [birth year] Referring Provider: Naaman Plummer  Encounter Date: 07/08/2017      PT End of Session - 07/08/17 1440    Visit Number 23  Gcode due 20th visit   Number of Visits 33  recert 0/17   Date for PT Re-Evaluation 07/13/17   Authorization Type UHC Medicare-GCODE every 10th visit   PT Start Time 1315   PT Stop Time 1405   PT Time Calculation (min) 50 min   Equipment Utilized During Treatment Gait belt   Activity Tolerance Patient tolerated treatment well   Behavior During Therapy [facility] for tasks assessed/performed      Past Medical History:  Diagnosis Date  . Fatigue   . Malaise   . Myalgia   . PAC (premature atrial contraction)    ISOLATED  . Palpitations    OCCASSIONAL  . Paroxysmal atrial flutter ([place])   . PVC's (premature ventricular contractions)    ISOLATED    Past Surgical History:  Procedure Laterality Date  . THORACIC LAMINECTOMY FOR EPIDURAL ABSCESS N/A 11/16/2014   Procedure: THORACIC LAMINECTOMY FOR EPIDURAL ABSCESS;  Surgeon: Floyce Stakes, MD;  Location: [facility];  Service: Neurosurgery;  Laterality: N/A;  . VULVAR LESION REMOVAL  01/01/2009   She had a 1-cm area of erythema to the right of the urethral meatus    There were no vitals filed for this visit.      Subjective Assessment - 07/08/17 1423    Subjective No pain, no changes since last visit. Vie, caregiver, just returned from 10 day vacation and reported she was unaware of discharge plan for this week. Noted pt had appts scheduled through the end of the month.   Patient is accompained by: Family member   Pertinent History R pelvic fracture 12/25/16, T9 paraplegia, history of compression fractures; unable to wear AFOs currently due to hammer  toe deformity L >R foot; blister on R heel (healed), recent UTI   Patient Stated Goals Pt's goal is for guidance for how to continue to get stronger.   Currently in Pain? No/denies                         [facility] Adult PT Treatment/Exercise - 07/08/17 1424      Transfers   Sit to Stand 6: Modified independent (Device/Increase time)   Stand to Sit 6: Modified independent (Device/Increase time)   Comments no cues for sequencing required     Ambulation/Gait   Ambulation/Gait Yes   Ambulation/Gait Assistance 6: Modified independent (Device/Increase time)   Ambulation/Gait Assistance Details pt self-correcting her distance to RW when it gets too far ahead (~50% of the time)   Ambulation Distance (Feet) 120 Feet  150, 120   Assistive device Rolling walker   Gait Pattern Step-through pattern;Decreased step length - right;Decreased step length - left;Decreased dorsiflexion - right;Decreased dorsiflexion - left;Wide base of support;Trunk flexed;Right steppage;Left steppage;Right foot flat;Left foot flat   Ambulation Surface Level;Indoor   Stairs Yes   Stairs Assistance 4: Min guard   Stairs Assistance Details (indicate cue type and reason) initial 4 steps up/down with supervision; last 8 steps up/down with minguard as legs fatigued and incr use of UEs   Stair Management Technique One rail Right;Two rails;Alternating pattern;Step to pattern;Forwards  Number of Stairs 12  4x 3 reps   Height of Stairs 6   Ramp 5: Supervision   Ramp Details (indicate cue type and reason) x2   Curb 5: Supervision   Curb Details (indicate cue type and reason) vc to not place RW so far ahead when lifted up onto curb; x 2     Knee/Hip Exercises: Machines for Strengthening   Cybex Leg Press 30# bil 10 reps x 3 (pt requested less wt than previously as she had some back pain after last time)             Balance Exercises - 07/08/17 1435      Balance Exercises: Standing   Standing Eyes Opened  Wide (BOA);Foam/compliant surface;20 secs;2 reps   Standing Eyes Closed Wide (BOA);Foam/compliant surface;3 reps;10 secs;20 secs   Step Ups Forward;Lateral;4 inch;UE support 1   Tandem Gait Forward;Intermittent upper extremity support;4 reps   Retro Gait 3 reps   Sidestepping Upper extremity support;2 reps   Marching Limitations light UE support           PT Education - 07/08/17 1439    Education provided Yes   Education Details Plan for discharge 8/14 [facility] [employer] [facility]discussed offerings at each)   [employer]) Educated Patient;Caregiver(s)  Noni Saupe   Methods Explanation   Comprehension Verbalized understanding          PT Short Term Goals - 06/09/17 1317      PT SHORT TERM GOAL #1   Title Pt will be independent with HEP for imrpoved strength, balance, and gait.  TARGET 04/11/17; TARGET updated to 04/30/17 due to missed sessions/illness: TARGET for all STGs 06/11/17   Baseline 7/11 pt required assist/cues with one out of 6 exercises   Time 4   Period Weeks   Status Partially Met     PT SHORT TERM GOAL #2   Title Pt will perform sit<>stand transfers, at least 4 of 5 trials, with minimal UE support, modified independently for improved transfer efficiency and safety.   Baseline 6/15 required mod assist of UEs   Time 4   Period Weeks   Status Achieved     PT SHORT TERM GOAL #3   Title Pt will improve TUG score to less than or equal to 19 seconds for decreased fall risk.   Baseline 6/15 16.68   Time 4   Period Weeks   Status Achieved     PT SHORT TERM GOAL #4   Title Pt will improve Berg Balance score to at least 27/56 for decreased fall risk.   Baseline 6/15 23/56; 7/11 27/56   Time 4   Period Weeks   Status Achieved     PT SHORT TERM GOAL #5   Title ---   Baseline ---           PT Long Term Goals - 07/08/17 1938      PT LONG TERM GOAL #1   Title Pt/caregiver Malachy Mood), will verbalize understanding of fall  prevention within the home environmen.t  TARGET 05/11/17; TARGET for all LTGs 07/09/17   Time 8   Period Weeks   Status On-going     PT LONG TERM GOAL #2   Title Pt will improve TUG score to less than or equal to 16 seconds for decreased fall risk. 6/15 updated to <=14 sec.    Time 8   Period Weeks   Status On-going  PT LONG TERM GOAL #3   Title Pt will improve Berg BAlance score to at least 32/56 for decreased fall risk.  05/14/17 updated to 30/56   Time 8   Period Weeks   Status On-going     PT LONG TERM GOAL #4   Title Pt will improve gait velocity to at least 2.4 ft/sec for improved gait efficiency and safety with household and community gait.     Time 8   Period Weeks   Status On-going     PT LONG TERM GOAL #5   Title Pt/caregiver will verbalize plans for continued fitness post D/C from PT.   Baseline 07/08/17 met   Time 8   Period Weeks   Status Achieved               Plan - 07/08/17 1932    Clinical Impression Statement Session focused on gait, balance, and strength training. Patient is now self-correcting her proximity to her RW ~50% of the time. She agrees her balance has improved with PT and states she does not feel anxious making simple meals in her kitchen. Additionally discussed discharge plan with pt and caregiver and they are in agreement with discharge from PT 07/13/17. Vie able to verbalize several options for continued community activity upon discharge.    Rehab Potential Good   PT Frequency 2x / week   PT Duration 8 weeks   PT Treatment/Interventions ADLs/Self Care Home Management;Functional mobility training;Gait training;DME Instruction;Therapeutic activities;Therapeutic exercise;Balance training;Neuromuscular re-education;Patient/family education;Orthotic Fit/Training   PT Next Visit Plan Begin checking LTGs; Please give fall prevention handout (it's a goal & doesn't appear it's been given!); ?Nu-step (L5) as they may go to [employer]; continue  reaching/dynamic activities while using RW to simulate reaching tasks she does at home; gait training with knee extension in stance phase, full hip extension-retro gait, tandem gait, Lt hip flexor stretching, decreasing UE dependence with transfers     Consulted and Agree with Plan of Care Patient;Family member/caregiver   Family Member Consulted caregiver      Patient will benefit from skilled therapeutic intervention in order to improve the following deficits and impairments:  Abnormal gait, Decreased balance, Decreased mobility, Decreased safety awareness, Decreased range of motion, Decreased strength, Impaired flexibility, Postural dysfunction  Visit Diagnosis: Unsteadiness on feet  Muscle weakness (generalized)  Other abnormalities of gait and mobility     Problem List Patient Active Problem List   Diagnosis Date Noted  . Other osteoporosis with current pathological fracture   . Chronic midline low back pain without sciatica   . Thrombus   . Closed nondisplaced fracture of pelvis with routine healing   . Pelvic ring fracture ([place])   . Osteoporosis with current pathological fracture   . Slow transit constipation   . Fractures   . Pain   . Generalized anxiety disorder   . Benign essential HTN   . Hypoalbuminemia due to protein-calorie malnutrition ([place])   . Thrombocytopenia ([place])   . Pulmonary hypertension ([place])   . Age-related osteoporosis with current pathological fracture   . Pelvic fracture ([place]) 12/26/2016  . Multiple closed fractures of pelvis with stable disruption of pelvic circle (HCC)   . Memory loss   . Lumbar burst fracture ([place]) 06/08/2016  . Lumbar compression fracture ([place]) 06/08/2016  . Bilateral foot-drop   . Midline thoracic back pain   . A-fib ([place])   . Depression   . Constipation due to pain medication   . Chronic constipation   .  Compression fracture of L2 ([place]) 05/30/2016  . Elevated blood pressure 05/30/2016  . L2 vertebral fracture ([place])  05/30/2016  . Anxiety state 12/10/2014  . Neurogenic bladder 11/30/2014  . Neurogenic bowel 11/30/2014  . Bacterial UTI 11/30/2014  . Thoracic myelopathy 11/29/2014  . Persistent atrial fibrillation ([place]) 11/27/2014  . Traumatic spinal subdural hematoma   . Acute pulmonary embolism ([place])   . Orthostatic hypotension 11/22/2014  . Near syncope 11/21/2014  . Paraplegia at T9 level ([place]) 11/17/2014  . Weakness of both legs   . Nocturnal leg cramps 07/16/2014  . Encounter for therapeutic drug monitoring 12/25/2013  . Malaise and fatigue 06/01/2011  . Atrial flutter ([place]) 03/10/2011  . Long term (current) use of anticoagulants 03/10/2011  . Benign hypertensive heart disease without heart failure 03/10/2011  . HLD (hyperlipidemia) 03/10/2011  . Paroxysmal atrial flutter ([place])   . Palpitations   . PAC (premature atrial contraction)   . PVC's (premature ventricular contractions)   . Malaise   . Fatigue   . Myalgia     Rexanne Mano , PT 07/08/2017, 7:42 PM  [facility] [address] [place], [place], [address] Phone: [phone]   Fax:  [phone]  Name: NAJWA SPILLANE MRN: [id number] Date of Birth: 06-23-1931

## 2017-07-09 ENCOUNTER — Encounter: Payer: Self-pay | Admitting: Rehabilitation

## 2017-07-09 ENCOUNTER — Ambulatory Visit: Payer: Medicare Other | Admitting: Rehabilitation

## 2017-07-09 DIAGNOSIS — R2681 Unsteadiness on feet: Secondary | ICD-10-CM

## 2017-07-09 DIAGNOSIS — M6281 Muscle weakness (generalized): Secondary | ICD-10-CM

## 2017-07-09 DIAGNOSIS — R293 Abnormal posture: Secondary | ICD-10-CM

## 2017-07-09 DIAGNOSIS — R2689 Other abnormalities of gait and mobility: Secondary | ICD-10-CM

## 2017-07-09 NOTE — Therapy (Signed)
[facility] [address] [place], [place], [address] Phone: [phone]   Fax:  [phone]  Physical Therapy Treatment  Patient Details  Name: [name] Weber MRN: [id number] Date of Birth: 05-30-1931 Referring Provider: Naaman Plummer  Encounter Date: 07/09/2017      PT End of Session - 07/09/17 1407    Visit Number 24  Gcode due 20th visit   Number of Visits 33  recert 2/11   Date for PT Re-Evaluation 07/13/17   Authorization Type UHC Medicare-GCODE every 10th visit   PT Start Time 1402   PT Stop Time 1445   PT Time Calculation (min) 43 min   Equipment Utilized During Treatment Gait belt   Activity Tolerance Patient tolerated treatment well   Behavior During Therapy [facility] for tasks assessed/performed      Past Medical History:  Diagnosis Date  . Fatigue   . Malaise   . Myalgia   . PAC (premature atrial contraction)    ISOLATED  . Palpitations    OCCASSIONAL  . Paroxysmal atrial flutter ([place])   . PVC's (premature ventricular contractions)    ISOLATED    Past Surgical History:  Procedure Laterality Date  . THORACIC LAMINECTOMY FOR EPIDURAL ABSCESS N/A 11/16/2014   Procedure: THORACIC LAMINECTOMY FOR EPIDURAL ABSCESS;  Surgeon: Floyce Stakes, MD;  Location: [facility];  Service: Neurosurgery;  Laterality: N/A;  . VULVAR LESION REMOVAL  01/01/2009   She had a 1-cm area of erythema to the right of the urethral meatus    There were no vitals filed for this visit.      Subjective Assessment - 07/09/17 1405    Subjective Pt reports no changes, no pain today.  She reports things are going well.     Patient is accompained by: Family member   Pertinent History R pelvic fracture 12/25/16, T9 paraplegia, history of compression fractures; unable to wear AFOs currently due to hammer toe deformity L >R foot; blister on R heel (healed), recent UTI   Patient Stated Goals Pt's goal is for guidance for how to  continue to get stronger.   Currently in Pain? No/denies                         [facility] Adult PT Treatment/Exercise - 07/09/17 1407      Ambulation/Gait   Gait velocity 2.34 ft/sec with RW   Stairs Yes   Stairs Assistance 5: Supervision;4: Min guard   Stairs Assistance Details (indicate cue type and reason) Pt able to perform stairs with B rails in alternating pattern at S level, however when using only single rail pt attempting to ascend forwards in which she needs min A to complete.  PT cued for asending and descending sideways with single rail.  Note marked improvement in safety and ability to complete at S level.  Also went over how she could complete 1-2 stairs with use of RW ascending backwards and descending forwards, just to provide options for stairs they may encounter in community.     Stair Management Technique Two rails;One rail Right;Step to pattern;Alternating pattern;Forwards;Sideways  see comments above   Number of Stairs 12   Height of Stairs 6     Standardized Balance Assessment   Standardized Balance Assessment Berg Balance Test     Berg Balance Test   Sit to Stand Able to stand  independently using hands   Standing Unsupported Able to stand 2 minutes with supervision  Sitting with Back Unsupported but Feet Supported on Floor or Stool Able to sit safely and securely 2 minutes   Stand to Sit Sits safely with minimal use of hands   Transfers Able to transfer safely, definite need of hands   Standing Unsupported with Eyes Closed Able to stand 10 seconds with supervision   Standing Ubsupported with Feet Together Needs help to attain position but able to stand for 30 seconds with feet together   From Standing, Reach Forward with Outstretched Arm Reaches forward but needs supervision   From Standing Position, Pick up Object from Floor Unable to pick up and needs supervision   From Standing Position, Turn to Look Behind Over each Shoulder Needs supervision  when turning   Turn 360 Degrees Needs assistance while turning   Standing Unsupported, Alternately Place Feet on Step/Stool Able to complete >2 steps/needs minimal assist   Standing Unsupported, One Foot in Front Needs help to step but can hold 15 seconds   Standing on One Leg Unable to try or needs assist to prevent fall   Total Score 26                PT Education - 07/09/17 1725    Education provided Yes   Education Details education regarding her likely always needing RW for support during gait unless she is willing to use AFOs.     Person(s) Educated Patient;Caregiver(s)   Methods Explanation   Comprehension Verbalized understanding          PT Short Term Goals - 06/09/17 1317      PT SHORT TERM GOAL #1   Title Pt will be independent with HEP for imrpoved strength, balance, and gait.  TARGET 04/11/17; TARGET updated to 04/30/17 due to missed sessions/illness: TARGET for all STGs 06/11/17   Baseline 7/11 pt required assist/cues with one out of 6 exercises   Time 4   Period Weeks   Status Partially Met     PT SHORT TERM GOAL #2   Title Pt will perform sit<>stand transfers, at least 4 of 5 trials, with minimal UE support, modified independently for improved transfer efficiency and safety.   Baseline 6/15 required mod assist of UEs   Time 4   Period Weeks   Status Achieved     PT SHORT TERM GOAL #3   Title Pt will improve TUG score to less than or equal to 19 seconds for decreased fall risk.   Baseline 6/15 16.68   Time 4   Period Weeks   Status Achieved     PT SHORT TERM GOAL #4   Title Pt will improve Berg Balance score to at least 27/56 for decreased fall risk.   Baseline 6/15 23/56; 7/11 27/56   Time 4   Period Weeks   Status Achieved     PT SHORT TERM GOAL #5   Title ---   Baseline ---           PT Long Term Goals - 07/09/17 1426      PT LONG TERM GOAL #1   Title Pt/caregiver ([name]), will verbalize understanding of fall prevention within  the home environmen.t  TARGET 05/11/17; TARGET for all LTGs 07/09/17   Time 8   Period Weeks   Status On-going     PT LONG TERM GOAL #2   Title Pt will improve TUG score to less than or equal to 16 seconds for decreased fall risk. 6/15 updated to <=14 sec.    Time  8   Period Weeks   Status On-going     PT LONG TERM GOAL #3   Title Pt will improve Berg BAlance score to at least 32/56 for decreased fall risk.  05/14/17 updated to 30/56   Baseline 26/56 on 07/09/17   Time 8   Period Weeks   Status Not Met     PT LONG TERM GOAL #4   Title Pt will improve gait velocity to at least 2.4 ft/sec for improved gait efficiency and safety with household and community gait.     Baseline 2.34 ft/sec with RW on 07/09/17   Time 8   Period Weeks   Status Partially Met     PT LONG TERM GOAL #5   Title Pt/caregiver will verbalize plans for continued fitness post D/C from PT.   Baseline 07/08/17 met   Time 8   Period Weeks   Status Achieved               Plan - 07/09/17 1719    Clinical Impression Statement Skilled session focused on beginning to address LTGs for D/C next week.  Performed BERG balance test with score of 26/56, one point lower than previous testing, therefore did not meet this goal.  She did make progress towards gait speed goal, but did not quite meet goal.  Discussed safety concerns with her increasing gait speed due to no AFOs.  Pt and caregiver state that braces caused increased pain and that she is not willing to wear them.  Therefore PT discussed that she will likely always need use of RW (she was asking about progressing to cane during session) to compensate for BLE weakness.  Both verbalized understanding.  Will check remaining goals next week with plan to D/C.     Rehab Potential Good   PT Frequency 2x / week   PT Duration 8 weeks   PT Treatment/Interventions ADLs/Self Care Home Management;Functional mobility training;Gait training;DME Instruction;Therapeutic  activities;Therapeutic exercise;Balance training;Neuromuscular re-education;Patient/family education;Orthotic Fit/Training   PT Next Visit Plan Finish checking LTGs and D/C; Please give fall prevention handout (it's a goal & doesn't appear it's been given!); ?Nu-step (L5) as they may go to [employer]; continue reaching/dynamic activities while using RW to simulate reaching tasks she does at home; gait training with knee extension in stance phase, full hip extension-retro gait, tandem gait, Lt hip flexor stretching, decreasing UE dependence with transfers    Consulted and Agree with Plan of Care Patient;Family member/caregiver   Family Member Consulted caregiver      Patient will benefit from skilled therapeutic intervention in order to improve the following deficits and impairments:  Abnormal gait, Decreased balance, Decreased mobility, Decreased safety awareness, Decreased range of motion, Decreased strength, Impaired flexibility, Postural dysfunction  Visit Diagnosis: Unsteadiness on feet  Muscle weakness (generalized)  Other abnormalities of gait and mobility  Abnormal posture     Problem List Patient Active Problem List   Diagnosis Date Noted  . Other osteoporosis with current pathological fracture   . Chronic midline low back pain without sciatica   . Thrombus   . Closed nondisplaced fracture of pelvis with routine healing   . Pelvic ring fracture ([place])   . Osteoporosis with current pathological fracture   . Slow transit constipation   . Fractures   . Pain   . Generalized anxiety disorder   . Benign essential HTN   . Hypoalbuminemia due to protein-calorie malnutrition ([place])   . Thrombocytopenia ([place])   . Pulmonary hypertension (  [place])   . Age-related osteoporosis with current pathological fracture   . Pelvic fracture ([place]) 12/26/2016  . Multiple closed fractures of pelvis with stable disruption of pelvic circle (HCC)   . Memory loss   . Lumbar burst fracture ([place])  06/08/2016  . Lumbar compression fracture ([place]) 06/08/2016  . Bilateral foot-drop   . Midline thoracic back pain   . A-fib ([place])   . Depression   . Constipation due to pain medication   . Chronic constipation   . Compression fracture of L2 ([place]) 05/30/2016  . Elevated blood pressure 05/30/2016  . L2 vertebral fracture ([place]) 05/30/2016  . Anxiety state 12/10/2014  . Neurogenic bladder 11/30/2014  . Neurogenic bowel 11/30/2014  . Bacterial UTI 11/30/2014  . Thoracic myelopathy 11/29/2014  . Persistent atrial fibrillation ([place]) 11/27/2014  . Traumatic spinal subdural hematoma   . Acute pulmonary embolism ([place])   . Orthostatic hypotension 11/22/2014  . Near syncope 11/21/2014  . Paraplegia at T9 level ([place]) 11/17/2014  . Weakness of both legs   . Nocturnal leg cramps 07/16/2014  . Encounter for therapeutic drug monitoring 12/25/2013  . Malaise and fatigue 06/01/2011  . Atrial flutter ([place]) 03/10/2011  . Long term (current) use of anticoagulants 03/10/2011  . Benign hypertensive heart disease without heart failure 03/10/2011  . HLD (hyperlipidemia) 03/10/2011  . Paroxysmal atrial flutter ([place])   . Palpitations   . PAC (premature atrial contraction)   . PVC's (premature ventricular contractions)   . Malaise   . Fatigue   . Myalgia     Cameron Sprang, PT, MPT [facility] [address] [place], [place], [address] Phone: [phone]   Fax:  [phone] 07/09/17, 5:28 PM  Name: DANNETTA LEKAS MRN: [id number] Date of Birth: [birth year]

## 2017-07-13 ENCOUNTER — Encounter: Payer: Self-pay | Admitting: Physical Therapy

## 2017-07-13 ENCOUNTER — Ambulatory Visit: Payer: Medicare Other | Admitting: Physical Therapy

## 2017-07-13 DIAGNOSIS — R2681 Unsteadiness on feet: Secondary | ICD-10-CM

## 2017-07-13 DIAGNOSIS — R2689 Other abnormalities of gait and mobility: Secondary | ICD-10-CM

## 2017-07-13 NOTE — Therapy (Signed)
[facility] [address] [place], [place], [address] Phone: [phone]   Fax:  [phone]  Physical Therapy Treatment and Discharge Summary  Patient Details  Name: [name] Weber MRN: [id number] Date of Birth: [birth year] Referring Provider: Naaman Plummer  Encounter Date: 07/13/2017      PT End of Session - 07/13/17 1453    Visit Number 25  Gcode due 20th visit   Number of Visits 33  recert 7/03   Date for PT Re-Evaluation 07/13/17   Authorization Type [employer] Medicare-GCODE every 10th visit   PT Start Time 1402   PT Stop Time 1440   PT Time Calculation (min) 38 min   Equipment Utilized During Treatment Gait belt   Activity Tolerance Patient tolerated treatment well   Behavior During Therapy WFL for tasks assessed/performed      Past Medical History:  Diagnosis Date  . Fatigue   . Malaise   . Myalgia   . PAC (premature atrial contraction)    ISOLATED  . Palpitations    OCCASSIONAL  . Paroxysmal atrial flutter ([place])   . PVC's (premature ventricular contractions)    ISOLATED    Past Surgical History:  Procedure Laterality Date  . THORACIC LAMINECTOMY FOR EPIDURAL ABSCESS N/A 11/16/2014   Procedure: THORACIC LAMINECTOMY FOR EPIDURAL ABSCESS;  Surgeon: Floyce Stakes, MD;  Location: [facility];  Service: Neurosurgery;  Laterality: N/A;  . VULVAR LESION REMOVAL  01/01/2009   She had a 1-cm area of erythema to the right of the urethral meatus    There were no vitals filed for this visit.      Subjective Assessment - 07/13/17 1405    Subjective Reports they are going to go by the [facility] on her way home from PT today.    Patient is accompained by: Family member   Pertinent History R pelvic fracture 12/25/16, T9 paraplegia, history of compression fractures; unable to wear AFOs currently due to hammer toe deformity L >R foot; blister on R heel (healed), recent UTI   Patient Stated Goals Pt's goal  is for guidance for how to continue to get stronger.   Currently in Pain? No/denies                         [facility] Adult PT Treatment/Exercise - 07/13/17 1411      Transfers   Sit to Stand 6: Modified independent (Device/Increase time)   Stand to Sit 6: Modified independent (Device/Increase time)     Ambulation/Gait   Ambulation/Gait Assistance 6: Modified independent (Device/Increase time)   Ambulation Distance (Feet) 120 Feet  x3   Assistive device Rolling walker   Gait Pattern Step-through pattern;Decreased dorsiflexion - right;Decreased dorsiflexion - left;Right steppage;Left steppage;Right foot flat;Left foot flat;Trunk flexed   Ambulation Surface Level;Indoor     Berg Balance Test   Sit to Stand Able to stand  independently using hands   Standing Unsupported Able to stand 2 minutes with supervision   Sitting with Back Unsupported but Feet Supported on Floor or Stool Able to sit safely and securely 2 minutes   Stand to Sit Sits safely with minimal use of hands   Transfers Able to transfer safely, minor use of hands   Standing Unsupported with Eyes Closed Able to stand 10 seconds with supervision   Standing Ubsupported with Feet Together Needs help to attain position but able to stand for 30 seconds with feet together   From Standing,  Reach Forward with Outstretched Arm Can reach forward >5 cm safely (2")   From Standing Position, Pick up Object from Floor Unable to pick up and needs supervision   From Standing Position, Turn to Look Behind Over each Shoulder Needs supervision when turning   Turn 360 Degrees Needs assistance while turning   Standing Unsupported, Alternately Place Feet on Step/Stool Able to complete >2 steps/needs minimal assist   Standing Unsupported, One Foot in [place] to take small step independently and hold 30 seconds   Standing on One Leg Unable to try or needs assist to prevent fall   Total Score 29     Timed Up and Go Test   Normal  TUG (seconds) 20.1             Balance Exercises - 07/13/17 1448      Balance Exercises: Standing   Tandem Stance Eyes open;Foam/compliant surface;Upper extremity support 1   SLS Eyes open;Upper extremity support 1   Tandem Gait Forward;Upper extremity support;Foam/compliant surface   Retro Gait Upper extremity support;3 reps   Sidestepping Upper extremity support   Other Standing Exercises alternating hands on //bar with other hand stretching and reaching for a ball PT was moving in all planes causing 4-6" reach outside her BOS           PT Education - 07/13/17 1452    Education provided Yes   Education Details importance of continued exercise/activity; fall prevention tips   Person(s) Educated Theatre stage manager);Patient   Methods Explanation;Handout   Comprehension Verbalized understanding  reports pt's home has already met fall prevention tips          PT Short Term Goals - 06/09/17 1317      PT SHORT TERM GOAL #1   Title Pt will be independent with HEP for imrpoved strength, balance, and gait.  TARGET 04/11/17; TARGET updated to 04/30/17 due to missed sessions/illness: TARGET for all STGs 06/11/17   Baseline 7/11 pt required assist/cues with one out of 6 exercises   Time 4   Period Weeks   Status Partially Met     PT SHORT TERM GOAL #2   Title Pt will perform sit<>stand transfers, at least 4 of 5 trials, with minimal UE support, modified independently for improved transfer efficiency and safety.   Baseline 6/15 required mod assist of UEs   Time 4   Period Weeks   Status Achieved     PT SHORT TERM GOAL #3   Title Pt will improve TUG score to less than or equal to 19 seconds for decreased fall risk.   Baseline 6/15 16.68   Time 4   Period Weeks   Status Achieved     PT SHORT TERM GOAL #4   Title Pt will improve Berg Balance score to at least 27/56 for decreased fall risk.   Baseline 6/15 23/56; 7/11 27/56   Time 4   Period Weeks   Status Achieved     PT SHORT  TERM GOAL #5   Title ---   Baseline ---           PT Long Term Goals - 07/13/17 1454      PT LONG TERM GOAL #1   Title Pt/caregiver ([name]), will verbalize understanding of fall prevention within the home environmen.t  TARGET 05/11/17; TARGET for all LTGs 07/09/17   Baseline 07/13/17 met   Time 8   Period Weeks   Status Achieved     PT LONG TERM GOAL #2  Title Pt will improve TUG score to less than or equal to 16 seconds for decreased fall risk. 6/15 updated to <=14 sec.    Baseline 08/04/17 20.10   Time 8   Period Weeks   Status Not Met     PT LONG TERM GOAL #3   Title Pt will improve Berg BAlance score to at least 32/56 for decreased fall risk.  05/14/17 updated to 30/56   Baseline 26/56 on 07/09/17; 08-04-2017 29/56 when pt's walker within arm's reach (due to high anxiety re: falling)   Time 8   Period Weeks   Status Partially Met     PT LONG TERM GOAL #4   Title Pt will improve gait velocity to at least 2.4 ft/sec for improved gait efficiency and safety with household and community gait.     Baseline 2.34 ft/sec with RW on 07/09/17   Time 8   Period Weeks   Status Partially Met     PT LONG TERM GOAL #5   Title Pt/caregiver will verbalize plans for continued fitness post D/C from PT.   Baseline 07/08/17 met   Time 8   Period Weeks   Status Achieved               Plan - 2017/08/04 1457    Clinical Impression Statement Skilled session focused on assessing remaining LTGs. Overall, pt met 2 of 5 goals, partially met 2 of 5 (showed improvedment but not to goal level), and did not meet one goal (Berg score). Patient verbalized understanding that she has made progress, however remains a high fall risk. Caregiver, Noni Saupe, present and reported no further questions. Remainder of session focused on balance and gait training. Patient is discharged from PT.    Rehab Potential Good   Consulted and Agree with Plan of Care Patient;Family member/caregiver   Family Member Consulted  caregiver      Patient will benefit from skilled therapeutic intervention in order to improve the following deficits and impairments:     Visit Diagnosis: Unsteadiness on feet  Other abnormalities of gait and mobility       G-Codes - 08/04/17 1501    Functional Assessment Tool Used (Outpatient Only) TUG 20.10; Merrilee Jansky 29/56   Functional Limitation Mobility: Walking and moving around   Mobility: Walking and Moving Around Goal Status [phone]) At least 20 percent but less than 40 percent impaired, limited or restricted   Mobility: Walking and Moving Around Discharge Status [phone]) At least 20 percent but less than 40 percent impaired, limited or restricted      Problem List Patient Active Problem List   Diagnosis Date Noted  . Other osteoporosis with current pathological fracture   . Chronic midline low back pain without sciatica   . Thrombus   . Closed nondisplaced fracture of pelvis with routine healing   . Pelvic ring fracture ([place])   . Osteoporosis with current pathological fracture   . Slow transit constipation   . Fractures   . Pain   . Generalized anxiety disorder   . Benign essential HTN   . Hypoalbuminemia due to protein-calorie malnutrition ([place])   . Thrombocytopenia ([place])   . Pulmonary hypertension ([place])   . Age-related osteoporosis with current pathological fracture   . Pelvic fracture ([place]) 12/26/2016  . Multiple closed fractures of pelvis with stable disruption of pelvic circle (HCC)   . Memory loss   . Lumbar burst fracture ([place]) 06/08/2016  . Lumbar compression fracture ([place]) 06/08/2016  . Bilateral foot-drop   .  Midline thoracic back pain   . A-fib ([place])   . Depression   . Constipation due to pain medication   . Chronic constipation   . Compression fracture of L2 ([place]) 05/30/2016  . Elevated blood pressure 05/30/2016  . L2 vertebral fracture ([place]) 05/30/2016  . Anxiety state 12/10/2014  . Neurogenic bladder 11/30/2014  . Neurogenic bowel 11/30/2014   . Bacterial UTI 11/30/2014  . Thoracic myelopathy 11/29/2014  . Persistent atrial fibrillation ([place]) 11/27/2014  . Traumatic spinal subdural hematoma   . Acute pulmonary embolism ([place])   . Orthostatic hypotension 11/22/2014  . Near syncope 11/21/2014  . Paraplegia at T9 level ([place]) 11/17/2014  . Weakness of both legs   . Nocturnal leg cramps 07/16/2014  . Encounter for therapeutic drug monitoring 12/25/2013  . Malaise and fatigue 06/01/2011  . Atrial flutter ([place]) 03/10/2011  . Long term (current) use of anticoagulants 03/10/2011  . Benign hypertensive heart disease without heart failure 03/10/2011  . HLD (hyperlipidemia) 03/10/2011  . Paroxysmal atrial flutter ([place])   . Palpitations   . PAC (premature atrial contraction)   . PVC's (premature ventricular contractions)   . Malaise   . Fatigue   . Myalgia    PHYSICAL THERAPY DISCHARGE SUMMARY  Visits from Start of Care: 25  Current functional level related to goals / functional outcomes: Modified independent with Rolling walker for gait   Remaining deficits: bil foot drop; bil neuropathy with decreased balance   Education / Equipment: HEP; [employer] for continued activity  Plan: Patient agrees to discharge.  Patient goals were partially met. Patient is being discharged due to lack of progress.  ?????        Rexanne Mano, PT 07/13/2017, 3:06 PM  [facility] [address] [place], [place], [address] Phone: [phone]   Fax:  [phone]  Name: [name] Weber MRN: [id number] Date of Birth: Apr 08, 1931

## 2017-07-13 NOTE — Patient Instructions (Addendum)
Fall Prevention in the Home Falls can cause injuries and can affect people from all age groups. There are many simple things that you can do to make your home safe and to help prevent falls. What can I do on the outside of my home?  Regularly repair the edges of walkways and driveways and fix any cracks.  Remove high doorway thresholds.  Trim any shrubbery on the main path into your home.  Use bright outdoor lighting.  Clear walkways of debris and clutter, including tools and rocks.  Regularly check that handrails are securely fastened and in good repair. Both sides of any steps should have handrails.  Install guardrails along the edges of any raised decks or porches.  Have leaves, snow, and ice cleared regularly.  Use sand or salt on walkways during winter months.  In the garage, clean up any spills right away, including grease or oil spills. What can I do in the bathroom?  Use night lights.  Install grab bars by the toilet and in the tub and shower. Do not use towel bars as grab bars.  Use non-skid mats or decals on the floor of the tub or shower.  If you need to sit down while you are in the shower, use a plastic, non-slip stool.  Keep the floor dry. Immediately clean up any water that spills on the floor.  Remove soap buildup in the tub or shower on a regular basis.  Attach bath mats securely with double-sided non-slip rug tape.  Remove throw rugs and other tripping hazards from the floor. What can I do in the bedroom?  Use night lights.  Make sure that a bedside light is easy to reach.  Do not use oversized bedding that drapes onto the floor.  Have a firm chair that has side arms to use for getting dressed.  Remove throw rugs and other tripping hazards from the floor. What can I do in the kitchen?  Clean up any spills right away.  Avoid walking on wet floors.  Place frequently used items in easy-to-reach places.  If you need to reach for something above  you, use a sturdy step stool that has a grab bar.  Keep electrical cables out of the way.  Do not use floor polish or wax that makes floors slippery. If you have to use wax, make sure that it is non-skid floor wax.  Remove throw rugs and other tripping hazards from the floor. What can I do in the stairways?  Do not leave any items on the stairs.  Make sure that there are handrails on both sides of the stairs. Fix handrails that are broken or loose. Make sure that handrails are as long as the stairways.  Check any carpeting to make sure that it is firmly attached to the stairs. Fix any carpet that is loose or worn.  Avoid having throw rugs at the top or bottom of stairways, or secure the rugs with carpet tape to prevent them from moving.  Make sure that you have a light switch at the top of the stairs and the bottom of the stairs. If you do not have them, have them installed. What are some other fall prevention tips?  Wear closed-toe shoes that fit well and support your feet. Wear shoes that have rubber soles or low heels.  When you use a stepladder, make sure that it is completely opened and that the sides are firmly locked. Have someone hold the ladder while you are using   it. Do not climb a closed stepladder.  Add color or contrast paint or tape to grab bars and handrails in your home. Place contrasting color strips on the first and last steps.  Use mobility aids as needed, such as canes, walkers, scooters, and crutches.  Turn on lights if it is dark. Replace any light bulbs that burn out.  Set up furniture so that there are clear paths. Keep the furniture in the same spot.  Fix any uneven floor surfaces.  Choose a carpet design that does not hide the edge of steps of a stairway.  Be aware of any and all pets.  Review your medicines with your healthcare provider. Some medicines can cause dizziness or changes in blood pressure, which increase your risk of falling. Talk with  your health care provider about other ways that you can decrease your risk of falls. This may include working with a physical therapist or trainer to improve your strength, balance, and endurance. This information is not intended to replace advice given to you by your health care provider. Make sure you discuss any questions you have with your health care provider. Document Released: 11/06/2002 Document Revised: 04/14/2016 Document Reviewed: 12/21/2014 Elsevier Interactive Patient Education  2017 Elsevier Inc.  

## 2017-07-15 ENCOUNTER — Ambulatory Visit: Payer: Medicare Other | Admitting: Physical Therapy

## 2017-07-20 ENCOUNTER — Ambulatory Visit: Payer: Medicare Other | Admitting: Physical Therapy

## 2017-07-22 ENCOUNTER — Ambulatory Visit: Payer: Medicare Other | Admitting: Physical Therapy

## 2017-07-27 ENCOUNTER — Ambulatory Visit: Payer: Medicare Other | Admitting: Physical Therapy

## 2017-07-29 ENCOUNTER — Ambulatory Visit: Payer: Medicare Other | Admitting: Physical Therapy

## 2017-11-15 ENCOUNTER — Encounter: Payer: Self-pay | Admitting: Physical Medicine & Rehabilitation

## 2017-11-15 ENCOUNTER — Encounter: Payer: Medicare Other | Attending: Physical Medicine & Rehabilitation | Admitting: Physical Medicine & Rehabilitation

## 2017-11-15 VITALS — BP 155/89 | HR 88

## 2017-11-15 DIAGNOSIS — Z87891 Personal history of nicotine dependence: Secondary | ICD-10-CM | POA: Insufficient documentation

## 2017-11-15 DIAGNOSIS — S32020S Wedge compression fracture of second lumbar vertebra, sequela: Secondary | ICD-10-CM | POA: Diagnosis not present

## 2017-11-15 DIAGNOSIS — R29898 Other symptoms and signs involving the musculoskeletal system: Secondary | ICD-10-CM | POA: Diagnosis present

## 2017-11-15 DIAGNOSIS — G8220 Paraplegia, unspecified: Secondary | ICD-10-CM | POA: Insufficient documentation

## 2017-11-15 DIAGNOSIS — I480 Paroxysmal atrial fibrillation: Secondary | ICD-10-CM | POA: Insufficient documentation

## 2017-11-15 DIAGNOSIS — M4714 Other spondylosis with myelopathy, thoracic region: Secondary | ICD-10-CM | POA: Diagnosis not present

## 2017-11-15 DIAGNOSIS — S32021S Stable burst fracture of second lumbar vertebra, sequela: Secondary | ICD-10-CM | POA: Diagnosis not present
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
## 2017-11-15 NOTE — Patient Instructions (Signed)
PLEASE FEEL FREE TO CALL OUR OFFICE WITH ANY PROBLEMS OR QUESTIONS ([phone])  HAVE A HAPPY HOLIDAYS!                     ^                  ^^                ^ ^ ^             ^ ^ ^ ^ ^           ^ ^ ^ ^ ^ ^ ^        ^ ^ ^ ^ ^ ^ ^ ^ ^      ^ ^ ^ ^ ^ ^ ^ ^ ^ ^ ^                ^^^^                ^^^^                ^^^^     

## 2017-12-12 ENCOUNTER — Other Ambulatory Visit: Payer: Self-pay | Admitting: Internal Medicine

## 2017-12-20 ENCOUNTER — Encounter: Payer: Self-pay | Admitting: *Deleted

## 2018-01-03 ENCOUNTER — Encounter: Payer: Self-pay | Admitting: Internal Medicine

## 2018-01-03 ENCOUNTER — Ambulatory Visit: Payer: Medicare Other | Admitting: Internal Medicine

## 2018-01-03 VITALS — BP 118/78 | HR 78 | Ht 68.0 in | Wt 105.4 lb

## 2018-01-03 DIAGNOSIS — I4891 Unspecified atrial fibrillation: Secondary | ICD-10-CM | POA: Diagnosis not present

## 2018-01-03 DIAGNOSIS — E782 Mixed hyperlipidemia: Secondary | ICD-10-CM

## 2018-01-03 MED ORDER — DIGOXIN 125 MCG PO TABS: 125.0000 ug | ORAL_TABLET | Freq: Every day | ORAL | 3 refills | Status: DC

## 2018-01-03 NOTE — Patient Instructions (Addendum)
Your physician recommends that you continue on your current medications as directed. Please refer to the Current Medication list given to you today. Your physician wants you to follow-up in: 1 year with Dr. Tenny Crawoss.  You will receive a reminder letter in the mail two months in advance. If you don't receive a letter, please call our office to schedule the follow-up appointment.  [facility]  Pt was seen in June 2018 and then Dec 2018-only creatinine and potassium checked at that time per caregiver.

## 2018-01-03 NOTE — Progress Notes (Signed)
Cardiology Office Note   Date:  01/03/2018   ID:  [name] Weber, DOB 04/27/1931, MRN [id number]  PCP:  Marden NobleGates, Robert, MD  Cardiologist:   Dietrich PatesPaula Sander Speckman, MD   F/U of atrial ifb      History of Present Illness: [name] Weber is a 84 y.o. female with a history of myelopathy, PAF, HL and spinal bleed   Seen by J allred in past for consideration of  Watchman as well [facility]  Pt does not want Rx   Does not want anticoagulation  Have reviewed risks in past   I saw her in Jan 2018  She continues to use walker to get around, though does not use all the time at home   Weak  No dizziness Denies palpitations  Denies SOB  No CP          Current Meds  Medication Sig  . aspirin EC 81 MG EC tablet Take 1 tablet (81 mg total) by mouth daily.  . Calcium Carb-Cholecalciferol (CALCIUM 600 + D PO) Take 1 tablet by mouth daily.   . digoxin (LANOXIN) 0.125 MG tablet Take 1 tablet (125 mcg total) by mouth daily. Please keep upcoming appt for future refills. Thank you  . latanoprost (XALATAN) 0.005 % ophthalmic solution Place 1 drop into both eyes at bedtime.  . Magnesium 400 MG CAPS Take 400 mg by mouth daily.  . Omega-3 Fatty Acids (FISH OIL ULTRA) 1400 MG CAPS Take 1,400 mg by mouth daily.     Allergies:   Crestor [rosuvastatin calcium] and Rosuvastatin   Past Medical History:  Diagnosis Date  . Fatigue   . Malaise   . Myalgia   . PAC (premature atrial contraction)    ISOLATED  . Palpitations    OCCASSIONAL  . Paroxysmal atrial flutter (HCC)   . PVC's (premature ventricular contractions)    ISOLATED    Past Surgical History:  Procedure Laterality Date  . THORACIC LAMINECTOMY FOR EPIDURAL ABSCESS N/A 11/16/2014   Procedure: THORACIC LAMINECTOMY FOR EPIDURAL ABSCESS;  Surgeon: Karn CassisErnesto M Botero, MD;  Location: [facility];  Service: Neurosurgery;  Laterality: N/A;  . VULVAR LESION REMOVAL  01/01/2009   She had a 1-cm area of erythema to the right of the urethral meatus      Social History:  The patient  reports that she quit smoking about 26 years ago. She has quit using smokeless tobacco. She reports that she does not drink alcohol or use drugs.   Family History:  The patient's family history includes Hyperlipidemia in her mother.    ROS:  Please see the history of present illness. All other systems are reviewed and  Negative to the above problem except as noted.    PHYSICAL EXAM: VS:  BP 118/78   Pulse 78   Ht 5\' 8"  (1.727 m)   Wt 105 lb 6.4 oz (47.8 kg)   BMI 16.03 kg/m   GEN: Thin 84 yo in no acute distress  HEENT: normal  Neck: JVP normal  , carotid bruits, or masses Cardiac: Irreg irreg  ; no murmurs, rubs, or gallops,no edema  Respiratory:  clear to auscultation bilaterally, normal work of breathing GI: soft, nontender, nondistended, + BS  No hepatomegaly  MS: Wearing braces on legs   Skin: warm and dry, no rash Neuro:  Strength and sensation are intact Psych: euthymic mood, full affect   EKG:  EKG is ordered today.  Atrial fibrillation  78 bpm  rsR' v1  Occsional PVC     Lipid Panel    Component Value Date/Time   CHOL 230 (H) 02/18/2012 0937   TRIG 50.0 02/18/2012 0937   HDL 87.90 02/18/2012 0937   CHOLHDL 3 02/18/2012 0937   VLDL 10.0 02/18/2012 0937   LDLDIRECT 126.0 02/18/2012 0937      Wt Readings from Last 3 Encounters:  01/03/18 105 lb 6.4 oz (47.8 kg)  01/06/17 104 lb 11.2 oz (47.5 kg)  01/06/17 121 lb 0.5 oz (54.9 kg)      ASSESSMENT AND PLAN:  1  Chronic atrial fib   CHADSVASc 3 Remains in atrial fibrillation  Rates OK   She is asymptomatic   Reviewed stroke risks  She does not, with hsitory of bleed, want to take anticoagulant     2  HL  Get labs from Dr Beverly Gust here   Encouraged her to stay hydrated F/U in 1 year  Sooner if symptoms change       Current medicines are reviewed at length with the patient today.  The patient does not have concerns regarding medicines.  Signed, Dietrich Pates,  MD  01/03/2018 3:20 PM    [facility] [address] [place], [place], [place]  [address] Phone: [phone]; Fax: [phone]

## 2018-06-07 ENCOUNTER — Telehealth: Payer: Self-pay

## 2018-06-07 NOTE — Telephone Encounter (Signed)
Via [name] called states she has POA, pt needing a prescription to get a lift chair to stand so able to use walker so insurance will pay for some of it. Will be using [facility].

## 2018-06-07 NOTE — Telephone Encounter (Signed)
rx written

## 2018-06-07 NOTE — Telephone Encounter (Signed)
Via notified and asked to fax to [facility] fax# [phone]. It has been faxed.

## 2018-11-05 ENCOUNTER — Inpatient Hospital Stay (HOSPITAL_COMMUNITY)
Admission: EM | Admit: 2018-11-05 | Discharge: 2018-11-19 | DRG: 184 | Disposition: A | Discharge status: Skilled Nursing Facility | Payer: Medicare Other | Attending: Internal Medicine | Admitting: Internal Medicine

## 2018-11-05 ENCOUNTER — Emergency Department (HOSPITAL_COMMUNITY): Payer: Medicare Other

## 2018-11-05 ENCOUNTER — Encounter (HOSPITAL_COMMUNITY): Payer: Self-pay | Admitting: Emergency Medicine

## 2018-11-05 DIAGNOSIS — W1811XA Fall from or off toilet without subsequent striking against object, initial encounter: Secondary | ICD-10-CM | POA: Diagnosis present

## 2018-11-05 DIAGNOSIS — N3090 Cystitis, unspecified without hematuria: Secondary | ICD-10-CM

## 2018-11-05 DIAGNOSIS — R102 Pelvic and perineal pain: Secondary | ICD-10-CM

## 2018-11-05 DIAGNOSIS — G8929 Other chronic pain: Secondary | ICD-10-CM | POA: Diagnosis present

## 2018-11-05 DIAGNOSIS — E440 Moderate protein-calorie malnutrition: Secondary | ICD-10-CM

## 2018-11-05 DIAGNOSIS — R7989 Other specified abnormal findings of blood chemistry: Secondary | ICD-10-CM | POA: Clinically undetermined

## 2018-11-05 DIAGNOSIS — S8001XA Contusion of right knee, initial encounter: Secondary | ICD-10-CM

## 2018-11-05 DIAGNOSIS — S81811A Laceration without foreign body, right lower leg, initial encounter: Secondary | ICD-10-CM

## 2018-11-05 DIAGNOSIS — Z681 Body mass index (BMI) 19 or less, adult: Secondary | ICD-10-CM

## 2018-11-05 DIAGNOSIS — I517 Cardiomegaly: Secondary | ICD-10-CM | POA: Diagnosis present

## 2018-11-05 DIAGNOSIS — M8000XA Age-related osteoporosis with current pathological fracture, unspecified site, initial encounter for fracture: Secondary | ICD-10-CM | POA: Diagnosis present

## 2018-11-05 DIAGNOSIS — I4892 Unspecified atrial flutter: Secondary | ICD-10-CM | POA: Diagnosis present

## 2018-11-05 DIAGNOSIS — Z7982 Long term (current) use of aspirin: Secondary | ICD-10-CM

## 2018-11-05 DIAGNOSIS — I951 Orthostatic hypotension: Secondary | ICD-10-CM | POA: Diagnosis present

## 2018-11-05 DIAGNOSIS — S2242XA Multiple fractures of ribs, left side, initial encounter for closed fracture: Secondary | ICD-10-CM | POA: Diagnosis not present

## 2018-11-05 DIAGNOSIS — J90 Pleural effusion, not elsewhere classified: Secondary | ICD-10-CM | POA: Clinically undetermined

## 2018-11-05 DIAGNOSIS — M25559 Pain in unspecified hip: Secondary | ICD-10-CM

## 2018-11-05 DIAGNOSIS — S81812A Laceration without foreign body, left lower leg, initial encounter: Secondary | ICD-10-CM

## 2018-11-05 DIAGNOSIS — H9190 Unspecified hearing loss, unspecified ear: Secondary | ICD-10-CM | POA: Diagnosis present

## 2018-11-05 DIAGNOSIS — I10 Essential (primary) hypertension: Secondary | ICD-10-CM | POA: Diagnosis present

## 2018-11-05 DIAGNOSIS — M47816 Spondylosis without myelopathy or radiculopathy, lumbar region: Secondary | ICD-10-CM | POA: Diagnosis present

## 2018-11-05 DIAGNOSIS — I272 Pulmonary hypertension, unspecified: Secondary | ICD-10-CM | POA: Diagnosis present

## 2018-11-05 DIAGNOSIS — R110 Nausea: Secondary | ICD-10-CM

## 2018-11-05 DIAGNOSIS — M545 Low back pain: Secondary | ICD-10-CM

## 2018-11-05 DIAGNOSIS — I480 Paroxysmal atrial fibrillation: Secondary | ICD-10-CM | POA: Diagnosis present

## 2018-11-05 DIAGNOSIS — S40011A Contusion of right shoulder, initial encounter: Secondary | ICD-10-CM

## 2018-11-05 DIAGNOSIS — Z23 Encounter for immunization: Secondary | ICD-10-CM

## 2018-11-05 DIAGNOSIS — R29898 Other symptoms and signs involving the musculoskeletal system: Secondary | ICD-10-CM | POA: Diagnosis present

## 2018-11-05 DIAGNOSIS — S2249XA Multiple fractures of ribs, unspecified side, initial encounter for closed fracture: Secondary | ICD-10-CM | POA: Diagnosis present

## 2018-11-05 DIAGNOSIS — Z87891 Personal history of nicotine dependence: Secondary | ICD-10-CM

## 2018-11-05 DIAGNOSIS — R5381 Other malaise: Secondary | ICD-10-CM | POA: Diagnosis present

## 2018-11-05 DIAGNOSIS — W010XXA Fall on same level from slipping, tripping and stumbling without subsequent striking against object, initial encounter: Secondary | ICD-10-CM

## 2018-11-05 DIAGNOSIS — K5909 Other constipation: Secondary | ICD-10-CM | POA: Diagnosis present

## 2018-11-05 DIAGNOSIS — R109 Unspecified abdominal pain: Secondary | ICD-10-CM

## 2018-11-05 DIAGNOSIS — K629 Disease of anus and rectum, unspecified: Secondary | ICD-10-CM | POA: Clinically undetermined

## 2018-11-05 DIAGNOSIS — N390 Urinary tract infection, site not specified: Secondary | ICD-10-CM | POA: Diagnosis present

## 2018-11-05 DIAGNOSIS — I4891 Unspecified atrial fibrillation: Secondary | ICD-10-CM | POA: Diagnosis present

## 2018-11-05 DIAGNOSIS — S2239XA Fracture of one rib, unspecified side, initial encounter for closed fracture: Secondary | ICD-10-CM | POA: Diagnosis present

## 2018-11-05 DIAGNOSIS — Y92002 Bathroom of unspecified non-institutional (private) residence single-family (private) house as the place of occurrence of the external cause: Secondary | ICD-10-CM

## 2018-11-05 DIAGNOSIS — E785 Hyperlipidemia, unspecified: Secondary | ICD-10-CM | POA: Diagnosis present

## 2018-11-05 DIAGNOSIS — I119 Hypertensive heart disease without heart failure: Secondary | ICD-10-CM | POA: Diagnosis present

## 2018-11-05 DIAGNOSIS — A499 Bacterial infection, unspecified: Secondary | ICD-10-CM | POA: Diagnosis present

## 2018-11-05 DIAGNOSIS — D72829 Elevated white blood cell count, unspecified: Secondary | ICD-10-CM | POA: Diagnosis present

## 2018-11-05 DIAGNOSIS — B9620 Unspecified Escherichia coli [E. coli] as the cause of diseases classified elsewhere: Secondary | ICD-10-CM | POA: Diagnosis present

## 2018-11-05 DIAGNOSIS — Z888 Allergy status to other drugs, medicaments and biological substances status: Secondary | ICD-10-CM

## 2018-11-05 DIAGNOSIS — G629 Polyneuropathy, unspecified: Secondary | ICD-10-CM | POA: Diagnosis present

## 2018-11-05 DIAGNOSIS — Z79899 Other long term (current) drug therapy: Secondary | ICD-10-CM

## 2018-11-05 DIAGNOSIS — K8020 Calculus of gallbladder without cholecystitis without obstruction: Secondary | ICD-10-CM

## 2018-11-05 DIAGNOSIS — E538 Deficiency of other specified B group vitamins: Secondary | ICD-10-CM

## 2018-11-05 DIAGNOSIS — R55 Syncope and collapse: Secondary | ICD-10-CM | POA: Diagnosis present

## 2018-11-05 HISTORY — DX: Multiple fractures of ribs, unspecified side, initial encounter for closed fracture: S22.49XA

## 2018-11-05 HISTORY — DX: Cystitis, unspecified without hematuria: N30.90

## 2018-11-05 LAB — CBC
HCT: 45 % (ref 36.0–46.0)
Hemoglobin: 14 g/dL (ref 12.0–15.0)
MCH: 31.7 pg (ref 26.0–34.0)
MCHC: 31.1 g/dL (ref 30.0–36.0)
MCV: 101.8 fL — ABNORMAL HIGH (ref 80.0–100.0)
Platelets: 166 10*3/uL (ref 150–400)
RBC: 4.42 MIL/uL (ref 3.87–5.11)
RDW: 12.1 % (ref 11.5–15.5)
WBC: 9.8 10*3/uL (ref 4.0–10.5)
nRBC: 0 % (ref 0.0–0.2)

## 2018-11-05 LAB — URINALYSIS, ROUTINE W REFLEX MICROSCOPIC
Bilirubin Urine: NEGATIVE
Glucose, UA: NEGATIVE mg/dL
Ketones, ur: 5 mg/dL — AB
Nitrite: POSITIVE — AB
Protein, ur: 30 mg/dL — AB
Specific Gravity, Urine: 1.018 (ref 1.005–1.030)
pH: 5 (ref 5.0–8.0)

## 2018-11-05 LAB — CREATININE, SERUM
Creatinine, Ser: 0.72 mg/dL (ref 0.44–1.00)
GFR calc Af Amer: >60 mL/min (ref >60)
GFR calc non Af Amer: >60 mL/min (ref >60)

## 2018-11-05 LAB — CBC WITH DIFFERENTIAL/PLATELET
Abs Immature Granulocytes: 0.09 10*3/uL — ABNORMAL HIGH (ref 0.00–0.07)
Basophils Absolute: 0 10*3/uL (ref 0.0–0.1)
Basophils Relative: 0 %
Eosinophils Absolute: 0 10*3/uL (ref 0.0–0.5)
Eosinophils Relative: 0 %
HCT: 48.6 % — ABNORMAL HIGH (ref 36.0–46.0)
Hemoglobin: 15.3 g/dL — ABNORMAL HIGH (ref 12.0–15.0)
Immature Granulocytes: 1 %
Lymphocytes Relative: 5 %
Lymphs Abs: 0.7 10*3/uL (ref 0.7–4.0)
MCH: 31.7 pg (ref 26.0–34.0)
MCHC: 31.5 g/dL (ref 30.0–36.0)
MCV: 100.8 fL — ABNORMAL HIGH (ref 80.0–100.0)
Monocytes Absolute: 0.8 10*3/uL (ref 0.1–1.0)
Monocytes Relative: 7 %
Neutro Abs: 10.8 10*3/uL — ABNORMAL HIGH (ref 1.7–7.7)
Neutrophils Relative %: 87 %
Platelets: 168 10*3/uL (ref 150–400)
RBC: 4.82 MIL/uL (ref 3.87–5.11)
RDW: 12 % (ref 11.5–15.5)
WBC: 12.4 10*3/uL — ABNORMAL HIGH (ref 4.0–10.5)
nRBC: 0 % (ref 0.0–0.2)

## 2018-11-05 LAB — BASIC METABOLIC PANEL
Anion gap: 17 — ABNORMAL HIGH (ref 5–15)
BUN: 17 mg/dL (ref 8–23)
CO2: 24 mmol/L (ref 22–32)
Calcium: 9.4 mg/dL (ref 8.9–10.3)
Chloride: 101 mmol/L (ref 98–111)
Creatinine, Ser: 0.88 mg/dL (ref 0.44–1.00)
GFR calc Af Amer: >60 mL/min (ref >60)
GFR calc non Af Amer: 60 mL/min — ABNORMAL LOW (ref >60)
Glucose, Bld: 117 mg/dL — ABNORMAL HIGH (ref 70–99)
Potassium: 4.2 mmol/L (ref 3.5–5.1)
Sodium: 142 mmol/L (ref 135–145)

## 2018-11-05 MED ORDER — ACETAMINOPHEN 325 MG PO TABS: 650.0000 mg | ORAL_TABLET | Freq: Four times a day (QID) | ORAL | Status: DC | PRN

## 2018-11-05 MED ORDER — ACETAMINOPHEN 325 MG PO TABS
650.0000 mg | ORAL_TABLET | Freq: Once | ORAL | Status: AC
  Administered 2018-11-05: 650 mg via ORAL
  Filled 2018-11-05: qty 2

## 2018-11-05 MED ORDER — POLYETHYLENE GLYCOL 3350 17 G PO PACK
17.0000 g | PACK | Freq: Every day | ORAL | Status: DC | PRN
  Administered 2018-11-09: 17 g via ORAL
  Filled 2018-11-05: qty 1

## 2018-11-05 MED ORDER — ONDANSETRON HCL 4 MG PO TABS
4.0000 mg | ORAL_TABLET | Freq: Four times a day (QID) | ORAL | Status: DC | PRN
  Administered 2018-11-16 – 2018-11-19 (×2): 4 mg via ORAL
  Filled 2018-11-05 (×2): qty 1

## 2018-11-05 MED ORDER — TETANUS-DIPHTH-ACELL PERTUSSIS 5-2.5-18.5 LF-MCG/0.5 IM SUSP
0.5000 mL | Freq: Once | INTRAMUSCULAR | Status: AC
  Administered 2018-11-05: 0.5 mL via INTRAMUSCULAR
  Filled 2018-11-05: qty 0.5

## 2018-11-05 MED ORDER — ONDANSETRON HCL 4 MG/2ML IJ SOLN
4.0000 mg | Freq: Four times a day (QID) | INTRAMUSCULAR | Status: DC | PRN
  Administered 2018-11-09 – 2018-11-19 (×7): 4 mg via INTRAVENOUS
  Filled 2018-11-05 (×7): qty 2

## 2018-11-05 MED ORDER — ACETAMINOPHEN 325 MG PO TABS
650.0000 mg | ORAL_TABLET | Freq: Four times a day (QID) | ORAL | Status: DC
  Administered 2018-11-05 – 2018-11-07 (×7): 650 mg via ORAL
  Filled 2018-11-05 (×7): qty 2

## 2018-11-05 MED ORDER — OXYCODONE HCL 5 MG PO TABS
5.0000 mg | ORAL_TABLET | ORAL | Status: DC | PRN
  Administered 2018-11-05 – 2018-11-08 (×2): 5 mg via ORAL
  Filled 2018-11-05 (×2): qty 1

## 2018-11-05 MED ORDER — CEFTRIAXONE SODIUM 1 G IJ SOLR
1.0000 g | Freq: Once | INTRAMUSCULAR | Status: AC
  Administered 2018-11-05: 1 g via INTRAVENOUS
  Filled 2018-11-05: qty 10

## 2018-11-05 MED ORDER — DIGOXIN 125 MCG PO TABS
125.0000 ug | ORAL_TABLET | Freq: Every day | ORAL | Status: DC
  Administered 2018-11-06 – 2018-11-19 (×14): 125 ug via ORAL
  Filled 2018-11-05 (×14): qty 1

## 2018-11-05 MED ORDER — RISAQUAD PO CAPS
1.0000 | ORAL_CAPSULE | Freq: Every day | ORAL | Status: DC
  Administered 2018-11-06 – 2018-11-19 (×14): 1 via ORAL
  Filled 2018-11-05 (×14): qty 1

## 2018-11-05 MED ORDER — LATANOPROST 0.005 % OP SOLN
1.0000 [drp] | Freq: Every day | OPHTHALMIC | Status: DC
  Administered 2018-11-05 – 2018-11-18 (×14): 1 [drp] via OPHTHALMIC
  Filled 2018-11-05 (×2): qty 2.5

## 2018-11-05 MED ORDER — ASPIRIN EC 81 MG PO TBEC
81.0000 mg | DELAYED_RELEASE_TABLET | Freq: Every day | ORAL | Status: DC
  Administered 2018-11-06 – 2018-11-19 (×14): 81 mg via ORAL
  Filled 2018-11-05 (×14): qty 1

## 2018-11-05 MED ORDER — POTASSIUM CHLORIDE IN NACL 20-0.9 MEQ/L-% IV SOLN
INTRAVENOUS | Status: DC
  Administered 2018-11-05 – 2018-11-06 (×2): via INTRAVENOUS
  Filled 2018-11-05 (×4): qty 1000

## 2018-11-05 MED ORDER — CEPHALEXIN 500 MG PO CAPS
500.0000 mg | ORAL_CAPSULE | Freq: Three times a day (TID) | ORAL | Status: DC
  Administered 2018-11-06: 500 mg via ORAL
  Filled 2018-11-05: qty 1

## 2018-11-05 MED ORDER — ENOXAPARIN SODIUM 40 MG/0.4ML ~~LOC~~ SOLN
40.0000 mg | SUBCUTANEOUS | Status: DC
  Administered 2018-11-05 – 2018-11-18 (×14): 40 mg via SUBCUTANEOUS
  Filled 2018-11-05 (×14): qty 0.4

## 2018-11-05 MED ORDER — TRAZODONE HCL 50 MG PO TABS
50.0000 mg | ORAL_TABLET | Freq: Every evening | ORAL | Status: DC | PRN
  Administered 2018-11-11 – 2018-11-17 (×3): 50 mg via ORAL
  Filled 2018-11-05 (×3): qty 1

## 2018-11-05 MED ORDER — ACETAMINOPHEN 650 MG RE SUPP: 650.0000 mg | Freq: Four times a day (QID) | RECTAL | Status: DC | PRN

## 2018-11-05 NOTE — ED Notes (Addendum)
RN offers toileting, pt states that she is unable to urinate at this time and declines. Water and gingerale at bedside

## 2018-11-05 NOTE — ED Notes (Addendum)
During orthostatics pt unable to stand on her own, needing maximum assist from this RN and pt's visitor. Pt complains of dizziness and weakness. Assisted back to bed. Pt also complained of left sided rib pain when standing and when laying down flat, worse with palpation.

## 2018-11-05 NOTE — ED Provider Notes (Signed)
Patient signed out by Dr Preston FleetingGlick - s/p fall at home, and when tried to get up and ambulate in ED, became faint/dizzy - unable to go home.  On labs, pt appears to have uti. u cx sent. Iv abx given.   On imaging, pt has multiple left rib fxs. No ptx. Acetaminophen po.  Given gen weakness, physical deconditioning, fall, faintness when stands, rib fx and uti - will admit.  hospitalists consulted for admission.  Anticipate relatively short hospital stay - pt may benefit from Case Management referral to set up home health services when ready for hospital d/c.      Cathren LaineSteinl, Krishika Bugge, MD 11/05/18 1049

## 2018-11-05 NOTE — ED Notes (Signed)
Dr Denton LankSteinl at bedside to assess ribs. Pt is drinking fluids without difficulty.

## 2018-11-05 NOTE — ED Notes (Signed)
Water and gingerale provided to pt

## 2018-11-05 NOTE — ED Notes (Signed)
This RN got the pt dressed, while the pt was standing with maximum assist by this RN and the pt's daughter, the pt became dizzy and weak. Sat back down on the bed and laid back down. Dr Preston FleetingGlick informed and back in the room. Pt will now need blood work and orthostatic vital signs.

## 2018-11-05 NOTE — ED Triage Notes (Signed)
BIB EMS from home. Pt fell off toilet and got stuck in between toilet and bathtub for approx 45-60 min. Pt C/O L rib rain, skin tear to LLE. VSS.

## 2018-11-05 NOTE — Discharge Instructions (Addendum)
Clean the wounds once a day, then apply antibiotic ointment (like Bacitracin, Neosporin) and a non-adherent dressing (like Telfa).  Talk with Dr. Kevan NyGates about whether home health visits would be helpful, and whether referral to the wound care clinic would be helpful.  You got a tetanus shot today. You should get another one in ten years.

## 2018-11-05 NOTE — H&P (Signed)
History and Physical    [name]:[id number] DOB: 1931-06-25 DOA: 11/05/2018  PCP: Marden Noble, MD   Patient coming from: Home  I have personally briefly reviewed patient's old medical records in [facility]  Chief Complaint: Larey Seat at home and got wedged between the tub and the toilet  HPI: [name] Weber is a 84 y.o. female with medical history significant of fatigue, malaise, paroxysmal atrial fibrillation (not on anticoagulation) occasional palpitations osteoporosis, lysed anxiety disorder, number cytopenia, pulmonary hypertension, and remote pelvic fracture who presents to the emergency department brought in by EMS after being wedged between the toilet and the tub when she left the toilet.  She suffered some skin tears to both lower legs and was noted to be in an awkward position with pressure applied to her right shoulder she had no other obvious head injury or loss of consciousness.  Does not take any antiplatelet agents or any anticoagulation as noted above.  Initial plan was to discharge her back home but when she got up and tried to ambulate in the emergency department she became faint and dizzy.  She was not orthostatic but she was unable to go home and ambulate safely.  Imaging revealed multiple left rib fractures of the lower ribs.  No pneumothorax.  Given her generalized weakness and physical deconditioning as well as fall and near syncopal episode in association with an abnormal urinalysis consistent with a UTI and the rib fractures it was decided she would be best served observed in the hospital overnight tuned up and have physical therapy see her.  Therefore she is being admitted to the hospital in observation for further evaluation.   Review of Systems: As per HPI otherwise all other systems reviewed and  negative.    Past Medical History:  Diagnosis Date  . Fatigue   . Malaise   . Myalgia   . PAC (premature atrial contraction)    ISOLATED  .  Palpitations    OCCASSIONAL  . Paroxysmal atrial flutter (HCC)   . PVC's (premature ventricular contractions)    ISOLATED    Past Surgical History:  Procedure Laterality Date  . THORACIC LAMINECTOMY FOR EPIDURAL ABSCESS N/A 11/16/2014   Procedure: THORACIC LAMINECTOMY FOR EPIDURAL ABSCESS;  Surgeon: Karn Cassis, MD;  Location: [facility];  Service: Neurosurgery;  Laterality: N/A;  . VULVAR LESION REMOVAL  01/01/2009   She had a 1-cm area of erythema to the right of the urethral meatus    Social History   Social History Narrative  . Not on file     reports that she quit smoking about 27 years ago. She has quit using smokeless tobacco. She reports that she does not drink alcohol or use drugs.  Allergies  Allergen Reactions  . Crestor [Rosuvastatin Calcium] Other (See Comments)    "bones ache all over"  . Rosuvastatin Other (See Comments)     Bone pain all over body    Family History  Problem Relation Age of Onset  . Hyperlipidemia Mother   . Breast cancer Neg Hx      Prior to Admission medications   Medication Sig Start Date End Date Taking? Authorizing Provider  acetaminophen (TYLENOL) 500 MG tablet Take 500 mg by mouth as needed for mild pain.   Yes [provider]  aspirin EC 81 MG EC tablet Take 1 tablet (81 mg total) by mouth daily. 12/17/14  Yes Love, Evlyn Kanner, PA-C  Calcium Carb-Cholecalciferol (CALCIUM 600 + D PO)  Take 1 tablet by mouth daily.    Yes [provider]  digoxin (LANOXIN) 0.125 MG tablet Take 1 tablet (125 mcg total) by mouth daily. 01/03/18  Yes Pricilla Riffleoss, Paula V, MD  LUMIGAN 0.01 % SOLN Place 1 drop into both eyes at bedtime. 11/04/18  Yes [provider]  Probiotic Product (PROBIOTIC ACIDOPHILUS BEADS) CAPS Take 1 capsule by mouth daily.   Yes [provider]  Magnesium 400 MG CAPS Take 400 mg by mouth daily. Patient not taking: Reported on 11/05/2018 01/13/17   Angiulli, Mcarthur Rossettianiel J, PA-C  Omega-3 Fatty Acids (FISH  OIL ULTRA) 1400 MG CAPS Take 1,400 mg by mouth daily. Patient not taking: Reported on 11/05/2018 01/13/17   Charlton AmorAngiulli, Daniel J, PA-C    Physical Exam:  Constitutional: NAD, calm, comfortable Vitals:   11/05/18 0800 11/05/18 0901 11/05/18 0915 11/05/18 1024  BP: 129/84 (!) 148/73  (!) 146/70  Pulse: 80 89 83 82  Resp: (!) 23 16  16   Temp:      TempSrc:      SpO2: 97% 97% 99% 99%   Eyes: PERRL, lids and conjunctivae normal ENMT: Mucous membranes are moist. Posterior pharynx clear of any exudate or lesions.Normal dentition.  Neck: normal, supple, no masses, no thyromegaly Respiratory: clear to auscultation bilaterally, no wheezing, no crackles. Normal respiratory effort. No accessory muscle use.  Cardiovascular: Regular rate and rhythm, no murmurs / rubs / gallops. No extremity edema. 2+ pedal pulses. No carotid bruits.  Abdomen: no tenderness, no masses palpated. No hepatosplenomegaly. Bowel sounds positive.  Musculoskeletal: no clubbing / cyanosis. No joint deformity upper and lower extremities. Good ROM, no contractures. Normal muscle tone.  Pain in the chest when sitting up and moving due to rib fractures Skin: no rashes, lesions, ulcers. No induration, bruises and skin tears on her knees noted Neurologic: CN 2-12 grossly intact. Sensation intact, DTR normal. Strength 5/5 in all 4.  Psychiatric: Normal judgment and insight. Alert and oriented x 3. Normal mood.    Labs on Admission: I have personally reviewed following labs and imaging studies  CBC: Recent Labs  Lab 11/05/18 0748  WBC 12.4*  NEUTROABS 10.8*  HGB 15.3*  HCT 48.6*  MCV 100.8*  PLT 168   Basic Metabolic Panel: Recent Labs  Lab 11/05/18 0748  NA 142  K 4.2  CL 101  CO2 24  GLUCOSE 117*  BUN 17  CREATININE 0.88  CALCIUM 9.4   Urine analysis:    Component Value Date/Time   COLORURINE YELLOW 11/05/2018 0939   APPEARANCEUR CLOUDY (A) 11/05/2018 0939   LABSPEC 1.018 11/05/2018 0939   PHURINE 5.0  11/05/2018 0939   GLUCOSEU NEGATIVE 11/05/2018 0939   HGBUR MODERATE (A) 11/05/2018 0939   BILIRUBINUR NEGATIVE 11/05/2018 0939   KETONESUR 5 (A) 11/05/2018 0939   PROTEINUR 30 (A) 11/05/2018 0939   UROBILINOGEN 0.2 01/23/2015 0959   NITRITE POSITIVE (A) 11/05/2018 0939   LEUKOCYTESUR TRACE (A) 11/05/2018 0939    Radiological Exams on Admission: Dg Ribs Unilateral W/chest Left  Result Date: 11/05/2018 CLINICAL DATA:  Pain after fall. EXAM: LEFT RIBS AND CHEST - 3+ VIEW COMPARISON:  Chest radiograph 12/26/2016 FINDINGS: Frontal view the chest and four views of left-sided ribs. Frontal view of the chest demonstrates midline trachea. Mild cardiomegaly. Atherosclerosis in the transverse aorta. No pleural effusion or pneumothorax. Left rib radiographs demonstrate subtle osseous irregularity involving anterior aspect of lower left ribs, most apparent on the final image. IMPRESSION: Suspect minimally displaced fractures of anterior  lower left ribs. Correlate with point tenderness. No evidence of pleural fluid or pneumothorax. Cardiomegaly without congestive failure. Aortic Atherosclerosis (ICD10-I70.0). Electronically Signed   By: Jeronimo Greaves M.D.   On: 11/05/2018 10:32   Dg Pelvis 1-2 Views  Result Date: 11/05/2018 CLINICAL DATA:  84 year old female with fall. EXAM: PELVIS - 1-2 VIEW COMPARISON:  Pelvic radiograph dated 12/30/2016 FINDINGS: There is no acute fracture or dislocation. Old right pubic rami fractures. The bones are osteopenic. The soft tissues are unremarkable. Partially visualized IVC filter. IMPRESSION: 1. No acute fracture or dislocation. 2. Old right pubic rami fractures. Electronically Signed   By: Elgie Collard M.D.   On: 11/05/2018 06:31   Dg Tibia/fibula Left  Result Date: 11/05/2018 CLINICAL DATA:  Initial evaluation for acute trauma, fall. EXAM: LEFT TIBIA AND FIBULA - 2 VIEW COMPARISON:  None. FINDINGS: No acute fracture or dislocation. Diffuse osteopenia noted. Mild  degenerative changes noted about the knee and ankle. No acute soft tissue abnormality. Vascular calcifications noted. IMPRESSION: No acute osseous abnormality about the left tibia/fibula. Electronically Signed   By: Rise Mu M.D.   On: 11/05/2018 06:32   Dg Knee Complete 4 Views Right  Result Date: 11/05/2018 CLINICAL DATA:  84 year old female with fall and right knee pain. EXAM: RIGHT KNEE - COMPLETE 4+ VIEW COMPARISON:  None. FINDINGS: There is no acute fracture or dislocation. Osteopenia. No arthritic changes. No joint effusion. The soft tissues appear unremarkable. IMPRESSION: No acute fracture or dislocation. Electronically Signed   By: Elgie Collard M.D.   On: 11/05/2018 06:32    EKG: Independently reviewed.  Atrial fibrillation Left anterior fascicular block Probable anteroseptal infarct, old Borderline T abnormalities, inferior leads    Assessment/Plan Principal Problem:   Multiple rib fractures Active Problems:   Weakness of both legs   Near syncope   A-fib (HCC)   Osteoporosis with current pathological fracture   Cystitis   Fatigue   Benign hypertensive heart disease without heart failure   HLD (hyperlipidemia)   Chronic midline low back pain without sciatica  1.  Multiple rib fractures: Patient will be admitted into the hospital we will give her an incentive spirometer and start her on Tylenol around-the-clock.  She will need to get out of bed and sit up to try to improve her pulmonary dynamics and prevent a pneumonia.  2.  Weakness of both legs associated with near syncopal event: Patient has history of weakness in her legs and spinal disorders she also had a bleed a long time ago in her spinal cord.  She has probably very weak legs neurologically but ambulates well at home with a walker.  Likely benefit from a physical therapy evaluation as currently she is unsafe to return home.  3.  Osteoporosis with history of pathological fracture: She now has rib  fractures likely related to being wedged in between the tub and the toilet as well as the possible impact on the fall.  Will provide around-the-clock Tylenol.  4.  Cystitis: Patient with abnormal urinalysis although this is asymptomatic given her current situation and difficulty with ambulating it is possible that a low-grade infection is preventing her from ambulating well.  She got ceftriaxone in the emergency department.  I will start Keflex 500 p.o. every 8 hours she is to get 7 days of treatment.  5.  Fatigue: Likely related to age and weakness will have PT evaluate.  6.  Benign hypertensive heart disease without heart failure: Continue home medication regimen.  7.  Hyperlipidemia: Not on any medication I suspect this is by choice.  8.  Atrial fibrillation not on anticoagulation.  Notes from cardiology reflect that she has been offered therapy including anticoagulation but the patient is not interested in taking a blood thinner.  9.  Chronic midline low back pain without sciatica: Continue pain medications and Tylenol as above    DVT prophylaxis: Lovenox Code Status: Full code Family Communication: Patient alert and retains capacity, no family present at the time of admission Disposition Plan: Will likely need skilled facility for rehab prior to discharge back home Consults called: PT OT Admission status: Observation   Lahoma Crocker MD FACP [facility] Pager [phone]  If 7PM-7AM, please contact night-coverage [email or web address] Password [email or web address]  11/05/2018, 12:20 PM

## 2018-11-05 NOTE — ED Notes (Signed)
Pt pulled into trauma room and assisted to use bed pan to give urine specimen

## 2018-11-05 NOTE — ED Notes (Signed)
Pt is eating breakfast tray and drinking fluids.

## 2018-11-05 NOTE — ED Notes (Signed)
Family reports that pt PCP is Dr. Marden Nobleobert [name]

## 2018-11-05 NOTE — ED Provider Notes (Addendum)
[facility] EMERGENCY DEPARTMENT Provider Note   CSN: [id number] Arrival date & time: 11/05/18  0448     History   Chief Complaint Chief Complaint  Patient presents with  . Fall    HPI [name] Weber is a 84 y.o. female.  The history is provided by the patient and a relative. The history is limited by the condition of the patient (Dementia).  Fall   She has history of hypertension and atrial fibrillation and comes in after getting wedged between her toilet and tub.  She suffered skin tears to both lower legs and was noted to be in an awkward position with some pressure seemed to be applied to her right shoulder.  There was no obvious head injury or loss of consciousness.  She seemed to have slid into that position rather than have fallen.  Last tetanus immunization is unknown.  She does have history of pelvis fracture from a previous fall.  Of note, she is not on a systemic anticoagulant in spite of the history of atrial fibrillation.  She is not on any antiplatelet agents either.  Past Medical History:  Diagnosis Date  . Fatigue   . Malaise   . Myalgia   . PAC (premature atrial contraction)    ISOLATED  . Palpitations    OCCASSIONAL  . Paroxysmal atrial flutter (HCC)   . PVC's (premature ventricular contractions)    ISOLATED    Patient Active Problem List   Diagnosis Date Noted  . Other osteoporosis with current pathological fracture   . Chronic midline low back pain without sciatica   . Thrombus   . Closed nondisplaced fracture of pelvis with routine healing   . Pelvic ring fracture (HCC)   . Osteoporosis with current pathological fracture   . Slow transit constipation   . Fractures   . Pain   . Generalized anxiety disorder   . Benign essential HTN   . Hypoalbuminemia due to protein-calorie malnutrition (HCC)   . Thrombocytopenia (HCC)   . Pulmonary hypertension (HCC)   . Age-related osteoporosis with current pathological fracture   .  Pelvic fracture (HCC) 12/26/2016  . Multiple closed fractures of pelvis with stable disruption of pelvic circle (HCC)   . Memory loss   . Lumbar burst fracture (HCC) 06/08/2016  . Lumbar compression fracture (HCC) 06/08/2016  . Bilateral foot-drop   . Midline thoracic back pain   . A-fib (HCC)   . Depression   . Constipation due to pain medication   . Chronic constipation   . Compression fracture of L2 (HCC) 05/30/2016  . Elevated blood pressure 05/30/2016  . L2 vertebral fracture (HCC) 05/30/2016  . Anxiety state 12/10/2014  . Neurogenic bladder 11/30/2014  . Neurogenic bowel 11/30/2014  . Bacterial UTI 11/30/2014  . Thoracic myelopathy 11/29/2014  . Persistent atrial fibrillation (HCC) 11/27/2014  . Traumatic spinal subdural hematoma   . Acute pulmonary embolism (HCC)   . Orthostatic hypotension 11/22/2014  . Near syncope 11/21/2014  . Paraplegia at T9 level (HCC) 11/17/2014  . Weakness of both legs   . Nocturnal leg cramps 07/16/2014  . Encounter for therapeutic drug monitoring 12/25/2013  . Malaise and fatigue 06/01/2011  . Atrial flutter (HCC) 03/10/2011  . Long term (current) use of anticoagulants 03/10/2011  . Benign hypertensive heart disease without heart failure 03/10/2011  . HLD (hyperlipidemia) 03/10/2011  . Paroxysmal atrial flutter (HCC)   . Palpitations   . PAC (premature atrial contraction)   . PVC's (premature  ventricular contractions)   . Malaise   . Fatigue   . Myalgia     Past Surgical History:  Procedure Laterality Date  . THORACIC LAMINECTOMY FOR EPIDURAL ABSCESS N/A 11/16/2014   Procedure: THORACIC LAMINECTOMY FOR EPIDURAL ABSCESS;  Surgeon: Karn Cassis, MD;  Location: [facility];  Service: Neurosurgery;  Laterality: N/A;  . VULVAR LESION REMOVAL  01/01/2009   She had a 1-cm area of erythema to the right of the urethral meatus     OB History   None      Home Medications    Prior to Admission medications   Medication Sig Start  Date End Date Taking? Authorizing Provider  aspirin EC 81 MG EC tablet Take 1 tablet (81 mg total) by mouth daily. 12/17/14   Love, Evlyn Kanner, PA-C  Calcium Carb-Cholecalciferol (CALCIUM 600 + D PO) Take 1 tablet by mouth daily.     [provider]  digoxin (LANOXIN) 0.125 MG tablet Take 1 tablet (125 mcg total) by mouth daily. 01/03/18   Pricilla Riffle, MD  latanoprost (XALATAN) 0.005 % ophthalmic solution Place 1 drop into both eyes at bedtime. 12/22/16   [provider]  Magnesium 400 MG CAPS Take 400 mg by mouth daily. 01/13/17   Angiulli, Mcarthur Rossetti, PA-C  Omega-3 Fatty Acids (FISH OIL ULTRA) 1400 MG CAPS Take 1,400 mg by mouth daily. 01/13/17   Angiulli, Mcarthur Rossetti, PA-C    Family History Family History  Problem Relation Age of Onset  . Hyperlipidemia Mother   . Breast cancer Neg Hx     Social History Social History   Tobacco Use  . Smoking status: Former Smoker    Last attempt to quit: 03/09/1991    Years since quitting: 27.6  . Smokeless tobacco: Former Engineer, water Use Topics  . Alcohol use: No  . Drug use: No     Allergies   Crestor [rosuvastatin calcium] and Rosuvastatin   Review of Systems Review of Systems  Unable to perform ROS: Dementia     Physical Exam Updated Vital Signs BP (!) 144/84 (BP Location: Right Arm)   Pulse 83   Temp 99 F (37.2 C) (Oral)   Resp 16   SpO2 100%   Physical Exam  Nursing note and vitals reviewed.  84 year old female, resting comfortably and in no acute distress. Vital signs are significant for borderline elevated blood pressure. Oxygen saturation is 100%, which is normal. Head is normocephalic and atraumatic. PERRLA, EOMI. Oropharynx is clear. Neck is nontender and supple without adenopathy or JVD. Back is nontender and there is no CVA tenderness. Lungs are clear without rales, wheezes, or rhonchi. Chest is nontender. Heart has regular rate and rhythm without murmur. Abdomen is soft, flat, nontender without  masses or hepatosplenomegaly and peristalsis is normoactive.  Pelvis is stable and nontender. Extremities: Skin tears are present over the anterior aspect of both lower legs just superior to the ankle-left worse than right.  Mild erythema noted right knee and posterior aspect of right shoulder without swelling or deformity.  There is full passive range of motion of all joints without apparent pain.  There is no swelling or deformity anywhere. Skin is warm and dry without rash. Neurologic: Mental status is normal, cranial nerves are intact, there are no motor or sensory deficits.  ED Treatments / Results   Radiology Dg Pelvis 1-2 Views  Result Date: 11/05/2018 CLINICAL DATA:  84 year old female with fall. EXAM: PELVIS - 1-2 VIEW COMPARISON:  Pelvic radiograph dated 12/30/2016 FINDINGS: There is no acute fracture or dislocation. Old right pubic rami fractures. The bones are osteopenic. The soft tissues are unremarkable. Partially visualized IVC filter. IMPRESSION: 1. No acute fracture or dislocation. 2. Old right pubic rami fractures. Electronically Signed   By: Elgie CollardArash  Radparvar M.D.   On: 11/05/2018 06:31   Dg Tibia/fibula Left  Result Date: 11/05/2018 CLINICAL DATA:  Initial evaluation for acute trauma, fall. EXAM: LEFT TIBIA AND FIBULA - 2 VIEW COMPARISON:  None. FINDINGS: No acute fracture or dislocation. Diffuse osteopenia noted. Mild degenerative changes noted about the knee and ankle. No acute soft tissue abnormality. Vascular calcifications noted. IMPRESSION: No acute osseous abnormality about the left tibia/fibula. Electronically Signed   By: Rise MuBenjamin  McClintock M.D.   On: 11/05/2018 06:32   Dg Knee Complete 4 Views Right  Result Date: 11/05/2018 CLINICAL DATA:  84 year old female with fall and right knee pain. EXAM: RIGHT KNEE - COMPLETE 4+ VIEW COMPARISON:  None. FINDINGS: There is no acute fracture or dislocation. Osteopenia. No arthritic changes. No joint effusion. The soft tissues  appear unremarkable. IMPRESSION: No acute fracture or dislocation. Electronically Signed   By: Elgie CollardArash  Radparvar M.D.   On: 11/05/2018 06:32    Procedures Procedures  Medications Ordered in ED Medications  Tdap (BOOSTRIX) injection 0.5 mL (0.5 mLs Intramuscular Given 11/05/18 0520)     Initial Impression / Assessment and Plan / ED Course  I have reviewed the triage vital signs and the nursing notes.  Pertinent imaging results that were available during my care of the patient were reviewed by me and considered in my medical decision making (see chart for details).  Fall with skin tears to both legs, minor contusion to right knee and right shoulder.  Because of history of pelvis fracture, will check pelvis x-ray and will get x-rays of both knees.  With no evidence of significant impact, and no evidence of head or neck trauma, will not get CT of head or cervical spine.  Tdap booster is given.  X-rays show no fracture.  I have discussed appropriate wound care with her daughter.  I have also discussed the fact that referral to wound care clinic may be beneficial.  Daughter was concerned about whether home health visits would be necessary-recommended this be discussed with her primary care provider.  Final Clinical Impressions(s) / ED Diagnoses   Final diagnoses:  Fall at home, initial encounter  Skin tear of lower leg without complication, left, initial encounter  Skin tear of lower leg without complication, right, initial encounter  Contusion of right knee, initial encounter  Contusion of right shoulder, initial encounter    ED Discharge Orders    None       Dione BoozeGlick, Shakti Fleer, MD 11/05/18 [phone]  As patient was getting ready for discharge, attempt to get her to stand up showed that she was weak and dizzy on standing.  We will check orthostatic vital signs and will need to check screening labs.  She lives by herself.  If not able to stand and ambulate, may need to hold for social service  consult.  Case is signed out to Dr. Denton LankSteinl.   Dione BoozeGlick, Sereen Schaff, MD 11/05/18 [phone]

## 2018-11-05 NOTE — ED Notes (Signed)
Pt pulled into trauma room to perform EKG

## 2018-11-06 ENCOUNTER — Observation Stay (HOSPITAL_COMMUNITY): Payer: Medicare Other

## 2018-11-06 DIAGNOSIS — N390 Urinary tract infection, site not specified: Secondary | ICD-10-CM

## 2018-11-06 DIAGNOSIS — I119 Hypertensive heart disease without heart failure: Secondary | ICD-10-CM

## 2018-11-06 DIAGNOSIS — S2242XA Multiple fractures of ribs, left side, initial encounter for closed fracture: Secondary | ICD-10-CM | POA: Diagnosis not present

## 2018-11-06 MED ORDER — CEPHALEXIN 500 MG PO CAPS
500.0000 mg | ORAL_CAPSULE | Freq: Two times a day (BID) | ORAL | Status: DC
  Administered 2018-11-06 – 2018-11-12 (×12): 500 mg via ORAL
  Filled 2018-11-06 (×12): qty 1

## 2018-11-06 NOTE — Progress Notes (Signed)
Returns from radiology (lumbar spine)

## 2018-11-06 NOTE — Evaluation (Signed)
Physical Therapy Evaluation Patient Details Name: [name] Weber MRN: [id number] DOB: 06/08/1931 Today's Date: 11/06/2018   History of Present Illness  Pt is an 84 y/o female with a past medical history of Fatigue, Malaise, Myalgia, PAC (premature atrial contraction), Palpitations, Paroxysmal atrial flutter, and PVC's (premature ventricular contractions). Pt sustained a fall at home and was wedged between tub and toilet. Pt became faint and dizzy with initial attempts to ambulate in ED, admitted for further workup. Imaging revealed multiple left rib fractures of the lower ribs.  No pneumothorax.     Clinical Impression  [name] Weber is a very pleasant 84 y/o female admitted with the above listed diagnosis. Patient reports that prior to admission she was Mod I with mobility and ADLs with RW. Patient today requiting light Min A for initial transfers and mobility due to pain at L ribs, however able to progress to Min guard with safe use of RW. Patient feels as if she is ambulating at near her baseline level with no episodes of LOB or overt instability. Will recommend HHPT at discharge to ensure safe and independent functional mobility in the home environment. PT to follow acutely.     Follow Up Recommendations Home health PT;Supervision for mobility/OOB    Equipment Recommendations  None recommended by PT    Recommendations for Other Services OT consult(as ordered)     Precautions / Restrictions Precautions Precautions: Fall Restrictions Weight Bearing Restrictions: No      Mobility  Bed Mobility Overal bed mobility: Needs Assistance Bed Mobility: Supine to Sit     Supine to sit: Min assist     General bed mobility comments: minA for trunk elevation; increased time and effort; cues to assist with scooting hips towards EOB  Transfers Overall transfer level: Needs assistance Equipment used: Rolling walker (2 wheeled) Transfers: Sit to/from Stand Sit to Stand: Min guard         General transfer comment: close minguard for safety though no physical assist required, VCs hand placement  Ambulation/Gait Ambulation/Gait assistance: Min assist;Min guard(progressed to MotorolaMin guard ) Gait Distance (Feet): 100 Feet Assistive device: Rolling walker (2 wheeled) Gait Pattern/deviations: Step-through pattern;Decreased stride length;Decreased dorsiflexion - right;Decreased dorsiflexion - left;Trunk flexed Gait velocity: decreased   General Gait Details: noted poor heel strike; tends to walk flat footed, of which she reports at baseline. no overt instability or LOB  Stairs            Wheelchair Mobility    Modified Rankin (Stroke Patients Only)       Balance Overall balance assessment: Needs assistance Sitting-balance support: Feet supported Sitting balance-Leahy Scale: Good     Standing balance support: Single extremity supported;Bilateral upper extremity supported;During functional activity Standing balance-Leahy Scale: Poor Standing balance comment: reliant on UE support for dynamic mobility; pt leaning against sink for increased stability during grooming task                             Pertinent Vitals/Pain Pain Assessment: Faces Faces Pain Scale: Hurts little more Pain Location: L ribcage Pain Descriptors / Indicators: Aching;Discomfort;Sharp;Sore Pain Intervention(s): Limited activity within patient's tolerance;Monitored during session;Repositioned    Home Living Family/patient expects to be discharged to:: Private residence Living Arrangements: Alone Available Help at Discharge: Friend(s);Neighbor;Available PRN/intermittently Type of Home: Other(Comment)(condo) Home Access: Stairs to enter Entrance Stairs-Rails: Right Entrance Stairs-Number of Steps: 5, landing, 5 more Home Layout: One level Home Equipment: Walker - 2 wheels;Shower seat;Grab bars -  tub/shower;Bedside commode Additional Comments: has a life alert, reports she was  wearing at the time of her fall     Prior Function Level of Independence: Independent with assistive device(s)         Comments: uses RW for ambulation; sponge bathes typically due to living alone     Hand Dominance   Dominant Hand: Right    Extremity/Trunk Assessment   Upper Extremity Assessment Upper Extremity Assessment: Defer to OT evaluation    Lower Extremity Assessment Lower Extremity Assessment: Generalized weakness    Cervical / Trunk Assessment Cervical / Trunk Assessment: Normal  Communication   Communication: HOH(has hearing aides, but does not have with her )  Cognition Arousal/Alertness: Awake/alert Behavior During Therapy: WFL for tasks assessed/performed Overall Cognitive Status: Within Functional Limits for tasks assessed                                 General Comments: requires repetition due to [facility]       General Comments General comments (skin integrity, edema, etc.): VSS throughout with patient denying dizziness/lightheadedness with mobility; good motivation to participate    Exercises     Assessment/Plan    PT Assessment Patient needs continued PT services  PT Problem List Decreased strength;Decreased activity tolerance;Decreased balance;Decreased mobility;Decreased safety awareness       PT Treatment Interventions DME instruction;Gait training;Stair training;Functional mobility training;Therapeutic activities;Therapeutic exercise;Balance training;Patient/family education    PT Goals (Current goals can be found in the Care Plan section)  Acute Rehab PT Goals Patient Stated Goal: to go home to her dog PT Goal Formulation: With patient Time For Goal Achievement: 11/20/18 Potential to Achieve Goals: Good    Frequency Min 3X/week   Barriers to discharge        Co-evaluation               AM-PAC PT "6 Clicks" Mobility  Outcome Measure Help needed turning from your back to your side while in a flat bed without  using bedrails?: A Little Help needed moving from lying on your back to sitting on the side of a flat bed without using bedrails?: A Little Help needed moving to and from a bed to a chair (including a wheelchair)?: A Little Help needed standing up from a chair using your arms (e.g., wheelchair or bedside chair)?: A Little Help needed to walk in hospital room?: A Little Help needed climbing 3-5 steps with a railing? : A Lot 6 Click Score: 17    End of Session Equipment Utilized During Treatment: Gait belt Activity Tolerance: Patient tolerated treatment well Patient left: in chair;with call bell/phone within reach;with chair alarm set Nurse Communication: Mobility status PT Visit Diagnosis: Unsteadiness on feet (R26.81);Other abnormalities of gait and mobility (R26.89);Muscle weakness (generalized) (M62.81);History of falling (Z91.81)    Time: 1610-9604 PT Time Calculation (min) (ACUTE ONLY): 13 min   Charges:   PT Evaluation $PT Eval Moderate Complexity: 1 Mod          Kipp Laurence, PT, DPT Supplemental Physical Therapist 11/06/18 10:20 AM Pager: [phone] Office: [phone]

## 2018-11-06 NOTE — Evaluation (Signed)
Occupational Therapy Evaluation Patient Details Name: [name] Weber MRN: [id number] DOB: September 23, 1931 Today's Date: 11/06/2018    History of Present Illness Pt is an 84 y/o female with a past medical history of Fatigue, Malaise, Myalgia, PAC (premature atrial contraction), Palpitations, Paroxysmal atrial flutter, and PVC's (premature ventricular contractions). Pt sustained a fall at home and was wedged between tub and toilet. Pt became faint and dizzy with initial attempts to ambulate in ED, admitted for further workup. Imaging revealed multiple left rib fractures of the lower ribs.  No pneumothorax.    Clinical Impression   This 84 y/o female presents with the above. At baseline pt reports she is mod independent with ADLs and functional mobility using RW, lives alone. Pt completing functional mobility this session using RW initially with minA progressed to close minguard assist. Pt currently requires setup assist for UB ADL, min-modA for LB ADL due to increased pain/discomfort in L ribcage with reaching towards LEs. Pt is motivated to work with therapy and very much wishing to return home at time of discharge. Pt will benefit from continued OT services while in acute setting to maximize her overall safety and independence with ADLs and mobility. Will follow.      Follow Up Recommendations  No OT follow up;Supervision/Assistance - 24 hour(recommend close to 24hr initially)    Equipment Recommendations  None recommended by OT(pt's DME needs are met)           Precautions / Restrictions Precautions Precautions: Fall Restrictions Weight Bearing Restrictions: No      Mobility Bed Mobility Overal bed mobility: Needs Assistance Bed Mobility: Supine to Sit     Supine to sit: Min assist     General bed mobility comments: minA for trunk elevation; increased time and effort; cues to assist with scooting hips towards EOB  Transfers Overall transfer level: Needs assistance Equipment  used: Rolling walker (2 wheeled) Transfers: Sit to/from Stand Sit to Stand: Min guard         General transfer comment: close minguard for safety though no physical assist required, VCs hand placement    Balance Overall balance assessment: Needs assistance Sitting-balance support: Feet supported Sitting balance-Leahy Scale: Good     Standing balance support: Single extremity supported;Bilateral upper extremity supported;During functional activity Standing balance-Leahy Scale: Poor Standing balance comment: reliant on UE support for dynamic mobility; pt leaning against sink for increased stability during grooming task                           ADL either performed or assessed with clinical judgement   ADL Overall ADL's : Needs assistance/impaired Eating/Feeding: Modified independent;Sitting   Grooming: Min guard;Minimal assistance;Wash/dry hands;Standing Grooming Details (indicate cue type and reason): minguard-minA for standing balance Upper Body Bathing: Sitting;Set up   Lower Body Bathing: Minimal assistance;Sit to/from stand   Upper Body Dressing : Set up;Sitting   Lower Body Dressing: Minimal assistance;Sit to/from stand Lower Body Dressing Details (indicate cue type and reason): pt with increased difficulty accessing  Toilet Transfer: Min guard;Minimal assistance;Ambulation;Regular Toilet;Grab bars;RW Armed forces technical officer Details (indicate cue type and reason): simulated in transfer to [place] and Hygiene: Minimal assistance;Sit to/from stand       Functional mobility during ADLs: Minimal assistance;Min guard;Rolling walker General ADL Comments: minA initially progressed to close minguard with mobility; pt performing mobility within room and into hallway this session     Vision  Perception     Praxis      Pertinent Vitals/Pain Pain Assessment: Faces Faces Pain Scale: Hurts little more Pain Location: L  ribcage Pain Descriptors / Indicators: Aching;Discomfort;Sharp;Sore Pain Intervention(s): Limited activity within patient's tolerance;Monitored during session;Repositioned     Hand Dominance Right   Extremity/Trunk Assessment Upper Extremity Assessment Upper Extremity Assessment: Overall WFL for tasks assessed   Lower Extremity Assessment Lower Extremity Assessment: Defer to PT evaluation   Cervical / Trunk Assessment Cervical / Trunk Assessment: Normal   Communication Communication Communication: HOH(has hearing aides, but does not have with her )   Cognition Arousal/Alertness: Awake/alert Behavior During Therapy: WFL for tasks assessed/performed Overall Cognitive Status: Within Functional Limits for tasks assessed                                 General Comments: requires repetition due to [place] Comments  pt slightly SOB after mobility, overall VSS, denies dizziness/lightheadedness with activity     Exercises     Shoulder Instructions      Home Living Family/patient expects to be discharged to:: Private residence Living Arrangements: Alone Available Help at Discharge: Friend(s);Neighbor;Available PRN/intermittently Type of Home: Other(Comment)(condo) Home Access: Stairs to enter Entrance Stairs-Number of Steps: 5, landing, 5 more Entrance Stairs-Rails: Right Home Layout: One level     Bathroom Shower/Tub: Occupational psychologist: Standard     Home Equipment: Environmental consultant - 2 wheels;Shower seat;Grab bars - tub/shower;Bedside commode   Additional Comments: has a life alert, reports she was wearing at the time of her fall       Prior Functioning/Environment Level of Independence: Independent with assistive device(s)        Comments: uses RW for ambulation; sponge bathes typically due to living alone        OT Problem List: Decreased strength;Decreased range of motion;Decreased activity tolerance;Impaired balance (sitting and/or  standing);Decreased knowledge of use of DME or AE;Pain      OT Treatment/Interventions: Self-care/ADL training;Therapeutic exercise;DME and/or AE instruction;Therapeutic activities;Patient/family education;Balance training    OT Goals(Current goals can be found in the care plan section) Acute Rehab OT Goals Patient Stated Goal: to go home to her dog OT Goal Formulation: With patient Time For Goal Achievement: 11/20/18 Potential to Achieve Goals: Good  OT Frequency: Min 2X/week   Barriers to D/C:            Co-evaluation              AM-PAC OT "6 Clicks" Daily Activity     Outcome Measure Help from another person eating meals?: None Help from another person taking care of personal grooming?: None Help from another person toileting, which includes using toliet, bedpan, or urinal?: A Little Help from another person bathing (including washing, rinsing, drying)?: A Little Help from another person to put on and taking off regular upper body clothing?: None Help from another person to put on and taking off regular lower body clothing?: A Little 6 Click Score: 21   End of Session Equipment Utilized During Treatment: Gait belt;Rolling walker Nurse Communication: Mobility status  Activity Tolerance: Patient tolerated treatment well Patient left: in chair;with call bell/phone within reach;with chair alarm set  OT Visit Diagnosis: Unsteadiness on feet (R26.81);Pain Pain - Right/Left: Left Pain - part of body: (ribcage)                Time: 6213-0865 OT Time Calculation (  min): 14 min Charges:  OT General Charges $OT Visit: 1 Visit OT Evaluation $OT Eval Low Complexity: [address], OT Supplemental Rehabilitation Services Pager [phone] Office [phone]   Raymondo Band 11/06/2018, 10:04 AM

## 2018-11-06 NOTE — Progress Notes (Addendum)
Progress Note    IZZABELL Weber  ONG:[id number] DOB: 07-Apr-1931  DOA: 11/05/2018 PCP: Marden Noble, MD    Brief Narrative:    Medical records reviewed and are as summarized below:  [name] Weber is an 84 y.o. female with medical history significant of fatigue, malaise, paroxysmal atrial fibrillation (not on anticoagulation) occasional palpitations osteoporosis, anxiety disorder, pulmonary hypertension, and remote pelvic fracture who presents to the emergency department brought in by EMS after being wedged between the toilet and the tub when she left the toilet.   Assessment/Plan:   Principal Problem:   Multiple rib fractures Active Problems:   Fatigue   Benign hypertensive heart disease without heart failure   HLD (hyperlipidemia)   Weakness of both legs   Near syncope   A-fib (HCC)   Osteoporosis with current pathological fracture   Chronic midline low back pain without sciatica   Cystitis  Multiple rib fractures:  - incentive spirometer  -Tylenol around-the-clock   Weakness/neuropathy of both legs associated with near syncopal event:  -Patient has history of weakness in her legs and spinal disorders she also had a bleed a long time ago in her spinal cord -dg lumbar spine  E coli UTI -ceftriaxone in the emergency department.   -Keflex 500 p.o. X 2 days for total of 3 days treatment   Benign hypertensive heart disease  -? Elevated related to pain?   Atrial fibrillation not on anticoagulation.  Notes from cardiology reflect that she has been offered therapy including anticoagulation but the patient is not interested in taking a blood thinner which in light of her fall is approriate    Family Communication/Anticipated D/C date and plan/Code Status   DVT prophylaxis: Lovenox ordered. Code Status: Full Code.  Family Communication:  Disposition Plan:    Medical Consultants:     Subjective:   C/o numbness in her LE  Objective:    Vitals:   11/05/18 1951 11/06/18 0538 11/06/18 0900 11/06/18 1045  BP: 125/74  (!) 183/91   Pulse: 77   77  Resp:      Temp: 98.3 F (36.8 C) 97.6 F (36.4 C)    TempSrc: Oral Oral    SpO2: 96% 97%     No intake or output data in the 24 hours ending 11/06/18 1433 There were no vitals filed for this visit.  Exam: In chair, hard of hearing irr but not fast Clear +BS, soft, NT No LE edema  Data Reviewed:   I have personally reviewed following labs and imaging studies:  Labs: Labs show the following:   Basic Metabolic Panel: Recent Labs  Lab 11/05/18 0748 11/05/18 1229  NA 142  --   K 4.2  --   CL 101  --   CO2 24  --   GLUCOSE 117*  --   BUN 17  --   CREATININE 0.88 0.72  CALCIUM 9.4  --    GFR CrCl cannot be calculated (Unknown ideal weight.). Liver Function Tests: No results for input(s): AST, ALT, ALKPHOS, BILITOT, PROT, ALBUMIN in the last 168 hours. No results for input(s): LIPASE, AMYLASE in the last 168 hours. No results for input(s): AMMONIA in the last 168 hours. Coagulation profile No results for input(s): INR, PROTIME in the last 168 hours.  CBC: Recent Labs  Lab 11/05/18 0748 11/05/18 1229  WBC 12.4* 9.8  NEUTROABS 10.8*  --   HGB 15.3* 14.0  HCT 48.6* 45.0  MCV 100.8* 101.8*  PLT 168 166  Cardiac Enzymes: No results for input(s): CKTOTAL, CKMB, CKMBINDEX, TROPONINI in the last 168 hours. BNP (last 3 results) No results for input(s): PROBNP in the last 8760 hours. CBG: No results for input(s): GLUCAP in the last 168 hours. D-Dimer: No results for input(s): DDIMER in the last 72 hours. Hgb A1c: No results for input(s): HGBA1C in the last 72 hours. Lipid Profile: No results for input(s): CHOL, HDL, LDLCALC, TRIG, CHOLHDL, LDLDIRECT in the last 72 hours. Thyroid function studies: No results for input(s): TSH, T4TOTAL, T3FREE, THYROIDAB in the last 72 hours.  Invalid input(s): FREET3 Anemia work up: No results for input(s): VITAMINB12,  FOLATE, FERRITIN, TIBC, IRON, RETICCTPCT in the last 72 hours. Sepsis Labs: Recent Labs  Lab 11/05/18 0748 11/05/18 1229  WBC 12.4* 9.8    Microbiology Recent Results (from the past 240 hour(s))  Urine Culture     Status: Abnormal (Preliminary result)   Collection Time: 11/05/18 10:50 AM  Result Value Ref Range Status   Specimen Description URINE, RANDOM  Final   Special Requests   Final    NONE Performed at [facility], [address] [place], [place] [address]    Culture >=100,000 COLONIES/mL ESCHERICHIA COLI (A)  Final   Report Status PENDING  Incomplete    Procedures and diagnostic studies:  Dg Ribs Unilateral W/chest Left  Result Date: 11/05/2018 CLINICAL DATA:  Pain after fall. EXAM: LEFT RIBS AND CHEST - 3+ VIEW COMPARISON:  Chest radiograph 12/26/2016 FINDINGS: Frontal view the chest and four views of left-sided ribs. Frontal view of the chest demonstrates midline trachea. Mild cardiomegaly. Atherosclerosis in the transverse aorta. No pleural effusion or pneumothorax. Left rib radiographs demonstrate subtle osseous irregularity involving anterior aspect of lower left ribs, most apparent on the final image. IMPRESSION: Suspect minimally displaced fractures of anterior lower left ribs. Correlate with point tenderness. No evidence of pleural fluid or pneumothorax. Cardiomegaly without congestive failure. Aortic Atherosclerosis (ICD10-I70.0). Electronically Signed   By: Jeronimo GreavesKyle  Talbot M.D.   On: 11/05/2018 10:32   Dg Pelvis 1-2 Views  Result Date: 11/05/2018 CLINICAL DATA:  84 year old female with fall. EXAM: PELVIS - 1-2 VIEW COMPARISON:  Pelvic radiograph dated 12/30/2016 FINDINGS: There is no acute fracture or dislocation. Old right pubic rami fractures. The bones are osteopenic. The soft tissues are unremarkable. Partially visualized IVC filter. IMPRESSION: 1. No acute fracture or dislocation. 2. Old right pubic rami fractures. Electronically Signed   By: Elgie CollardArash  Radparvar  M.D.   On: 11/05/2018 06:31   Dg Tibia/fibula Left  Result Date: 11/05/2018 CLINICAL DATA:  Initial evaluation for acute trauma, fall. EXAM: LEFT TIBIA AND FIBULA - 2 VIEW COMPARISON:  None. FINDINGS: No acute fracture or dislocation. Diffuse osteopenia noted. Mild degenerative changes noted about the knee and ankle. No acute soft tissue abnormality. Vascular calcifications noted. IMPRESSION: No acute osseous abnormality about the left tibia/fibula. Electronically Signed   By: Rise MuBenjamin  McClintock M.D.   On: 11/05/2018 06:32   Dg Knee Complete 4 Views Right  Result Date: 11/05/2018 CLINICAL DATA:  84 year old female with fall and right knee pain. EXAM: RIGHT KNEE - COMPLETE 4+ VIEW COMPARISON:  None. FINDINGS: There is no acute fracture or dislocation. Osteopenia. No arthritic changes. No joint effusion. The soft tissues appear unremarkable. IMPRESSION: No acute fracture or dislocation. Electronically Signed   By: Elgie CollardArash  Radparvar M.D.   On: 11/05/2018 06:32    Medications:   . acetaminophen  650 mg Oral Q6H  . acidophilus  1 capsule Oral  Daily  . aspirin EC  81 mg Oral Daily  . cephALEXin  500 mg Oral Q12H  . digoxin  125 mcg Oral Daily  . enoxaparin (LOVENOX) injection  40 mg Subcutaneous Q24H  . latanoprost  1 drop Both Eyes QHS   Continuous Infusions:   LOS: 0 days   Joseph Art  [facility]   *Please refer to [email or web address], password [email or web address] to get updated schedule on who will round on this patient, as hospitalists switch teams weekly. If 7PM-7AM, please contact night-coverage at [email or web address], password [email or web address] for any overnight needs.  11/06/2018, 2:33 PM

## 2018-11-07 DIAGNOSIS — N390 Urinary tract infection, site not specified: Secondary | ICD-10-CM | POA: Diagnosis not present

## 2018-11-07 DIAGNOSIS — I119 Hypertensive heart disease without heart failure: Secondary | ICD-10-CM | POA: Diagnosis not present

## 2018-11-07 DIAGNOSIS — S2242XA Multiple fractures of ribs, left side, initial encounter for closed fracture: Secondary | ICD-10-CM | POA: Diagnosis not present

## 2018-11-07 LAB — BASIC METABOLIC PANEL
Anion gap: 8 (ref 5–15)
BUN: 10 mg/dL (ref 8–23)
CO2: 25 mmol/L (ref 22–32)
Calcium: 8.5 mg/dL — ABNORMAL LOW (ref 8.9–10.3)
Chloride: 108 mmol/L (ref 98–111)
Creatinine, Ser: 0.65 mg/dL (ref 0.44–1.00)
GFR calc Af Amer: >60 mL/min (ref >60)
GFR calc non Af Amer: >60 mL/min (ref >60)
Glucose, Bld: 98 mg/dL (ref 70–99)
Potassium: 3.9 mmol/L (ref 3.5–5.1)
Sodium: 141 mmol/L (ref 135–145)

## 2018-11-07 LAB — CBC
HCT: 41.7 % (ref 36.0–46.0)
Hemoglobin: 13.4 g/dL (ref 12.0–15.0)
MCH: 31.9 pg (ref 26.0–34.0)
MCHC: 32.1 g/dL (ref 30.0–36.0)
MCV: 99.3 fL (ref 80.0–100.0)
Platelets: 141 10*3/uL — ABNORMAL LOW (ref 150–400)
RBC: 4.2 MIL/uL (ref 3.87–5.11)
RDW: 12.1 % (ref 11.5–15.5)
WBC: 7.8 10*3/uL (ref 4.0–10.5)
nRBC: 0 % (ref 0.0–0.2)

## 2018-11-07 LAB — URINE CULTURE: Culture: >=100000 — AB

## 2018-11-07 LAB — VITAMIN B12: Vitamin B-12: 193 pg/mL (ref 180–914)

## 2018-11-07 MED ORDER — DICLOFENAC SODIUM 1 % TD GEL
4.0000 g | Freq: Four times a day (QID) | TRANSDERMAL | Status: DC
  Administered 2018-11-07 (×2): 4 g via TOPICAL
  Filled 2018-11-07: qty 100

## 2018-11-07 MED ORDER — ACETAMINOPHEN 500 MG PO TABS
1000.0000 mg | ORAL_TABLET | Freq: Three times a day (TID) | ORAL | Status: DC
  Administered 2018-11-07 – 2018-11-19 (×35): 1000 mg via ORAL
  Filled 2018-11-07 (×35): qty 2

## 2018-11-07 MED ORDER — SODIUM CHLORIDE 0.9 % IV SOLN
INTRAVENOUS | Status: DC
  Administered 2018-11-07 – 2018-11-08 (×2): via INTRAVENOUS

## 2018-11-07 MED ORDER — VITAMIN B-12 1000 MCG PO TABS
500.0000 ug | ORAL_TABLET | Freq: Every day | ORAL | Status: DC
  Administered 2018-11-07 – 2018-11-19 (×13): 500 ug via ORAL
  Filled 2018-11-07 (×13): qty 1

## 2018-11-07 NOTE — NC FL2 (Signed)
[place] MEDICAID FL2 LEVEL OF CARE SCREENING TOOL     IDENTIFICATION  Patient Name: [name] Weber Birthdate: 05/20/1931 Sex: female Admission Date (Current Location): 11/05/2018  [facility] [place]   Facility and Address:  [facility] [place], [address], [place], [place] [address]      Provider Number: [id number]  Attending Physician Name and Address:  Joseph ArtVann, Jessica U, DO  Relative Name and Phone Number:  Via [phone]    Current Level of Care: Hospital Recommended Level of Care: Skilled Nursing Facility Prior Approval Number:    Date Approved/Denied:   PASRR Number: [id number]  Discharge Plan: SNF    Current Diagnoses: Patient Active Problem List   Diagnosis Date Noted  . Multiple rib fractures 11/05/2018  . Cystitis 11/05/2018  . Other osteoporosis with current pathological fracture   . Chronic midline low back pain without sciatica   . Thrombus   . Closed nondisplaced fracture of pelvis with routine healing   . Pelvic ring fracture (HCC)   . Osteoporosis with current pathological fracture   . Slow transit constipation   . Fractures   . Pain   . Generalized anxiety disorder   . Benign essential HTN   . Hypoalbuminemia due to protein-calorie malnutrition (HCC)   . Thrombocytopenia (HCC)   . Pulmonary hypertension (HCC)   . Age-related osteoporosis with current pathological fracture   . Pelvic fracture (HCC) 12/26/2016  . Multiple closed fractures of pelvis with stable disruption of pelvic circle (HCC)   . Memory loss   . Lumbar burst fracture (HCC) 06/08/2016  . Lumbar compression fracture (HCC) 06/08/2016  . Bilateral foot-drop   . Midline thoracic back pain   . A-fib (HCC)   . Depression   . Constipation due to pain medication   . Chronic constipation   . Compression fracture of L2 (HCC) 05/30/2016  . Elevated blood pressure 05/30/2016  . L2 vertebral fracture (HCC) 05/30/2016  . Anxiety state  12/10/2014  . Neurogenic bladder 11/30/2014  . Neurogenic bowel 11/30/2014  . Bacterial UTI 11/30/2014  . Thoracic myelopathy 11/29/2014  . Persistent atrial fibrillation (HCC) 11/27/2014  . Traumatic spinal subdural hematoma   . Acute pulmonary embolism (HCC)   . Orthostatic hypotension 11/22/2014  . Near syncope 11/21/2014  . Weakness of both legs   . Nocturnal leg cramps 07/16/2014  . Encounter for therapeutic drug monitoring 12/25/2013  . Malaise and fatigue 06/01/2011  . Atrial flutter (HCC) 03/10/2011  . Benign hypertensive heart disease without heart failure 03/10/2011  . HLD (hyperlipidemia) 03/10/2011  . Paroxysmal atrial flutter (HCC)   . Palpitations   . PAC (premature atrial contraction)   . PVC's (premature ventricular contractions)   . Malaise   . Fatigue   . Myalgia     Orientation RESPIRATION BLADDER Height & Weight     Self, Time, Situation, Place  Normal Incontinent, External catheter Weight:   Height:     BEHAVIORAL SYMPTOMS/MOOD NEUROLOGICAL BOWEL NUTRITION STATUS      Continent Diet(see discharge summary)  AMBULATORY STATUS COMMUNICATION OF NEEDS Skin   Limited Assist Verbally Other (Comment)(bilateral and anterior skin tears on legs)                       Personal Care Assistance Level of Assistance  Bathing, Feeding, Dressing, Total care Bathing Assistance: Limited assistance Feeding assistance: Independent Dressing Assistance: Limited assistance Total Care Assistance: Limited assistance   Functional Limitations Info  Sight, Hearing, Speech Sight Info: Adequate Hearing Info: Impaired(slightly hard of hearing) Speech Info: Adequate    SPECIAL CARE FACTORS FREQUENCY  PT (By licensed PT), OT (By licensed OT)     PT Frequency: min 5x weekly OT Frequency: min 5x weekly            Contractures Contractures Info: Not present    Additional Factors Info  Code Status, Allergies Code Status Info: full Allergies Info: Allergies:   Crestor Rosuvastatin Calcium, Rosuvastatin           Current Medications (11/07/2018):  This is the current hospital active medication list Current Facility-Administered Medications  Medication Dose Route Frequency Provider Last Rate Last Dose  . 0.9 %  sodium chloride infusion   Intravenous Continuous Marlin Canary U, DO 50 mL/hr at 11/07/18 1227    . acetaminophen (TYLENOL) tablet 1,000 mg  1,000 mg Oral TID Marlin Canary U, DO      . acidophilus (RISAQUAD) capsule 1 capsule  1 capsule Oral Daily Lahoma Crocker, MD   1 capsule at 11/07/18 0840  . aspirin EC tablet 81 mg  81 mg Oral Daily Lahoma Crocker, MD   81 mg at 11/07/18 0835  . cephALEXin (KEFLEX) capsule 500 mg  500 mg Oral Q12H Vann, Jessica U, DO   500 mg at 11/07/18 0835  . diclofenac sodium (VOLTAREN) 1 % transdermal gel 4 g  4 g Topical QID Vann, Jessica U, DO      . digoxin (LANOXIN) tablet 125 mcg  125 mcg Oral Daily Lahoma Crocker, MD   125 mcg at 11/07/18 0836  . enoxaparin (LOVENOX) injection 40 mg  40 mg Subcutaneous Q24H Lahoma Crocker, MD   40 mg at 11/07/18 1423  . latanoprost (XALATAN) 0.005 % ophthalmic solution 1 drop  1 drop Both Eyes QHS Lahoma Crocker, MD   1 drop at 11/06/18 2318  . ondansetron (ZOFRAN) tablet 4 mg  4 mg Oral Q6H PRN Lahoma Crocker, MD       Or  . ondansetron [facility]) injection 4 mg  4 mg Intravenous Q6H PRN Lahoma Crocker, MD      . oxyCODONE (Oxy IR/ROXICODONE) immediate release tablet 5 mg  5 mg Oral Q4H PRN Lahoma Crocker, MD   5 mg at 11/05/18 1827  . polyethylene glycol (MIRALAX / GLYCOLAX) packet 17 g  17 g Oral Daily PRN Lahoma Crocker, MD      . traZODone (DESYREL) tablet 50 mg  50 mg Oral QHS PRN Lahoma Crocker, MD      . vitamin B-12 (CYANOCOBALAMIN) tablet 500 mcg  500 mcg Oral Daily Marlin Canary U, DO   500 mcg at 11/07/18 3664     Discharge Medications: Please see discharge summary for a list of discharge medications.  Relevant Imaging  Results:  Relevant Lab Results:   Additional Information SSN: [id number]  Gildardo Griffes, LCSW

## 2018-11-07 NOTE — Clinical Social Work Note (Signed)
**Note De-Identified [name] Weber Obfuscation** Clinical Social Work Assessment  Patient Details  Name: [name] Weber MRN: [id number] Date of Birth: 12/15/1930  Date of referral:  11/07/18               Reason for consult:  Discharge Planning                Permission sought to share information with:  Case Manager, Facility Medical sales representativeContact Representative, Family Supports Permission granted to share information::  Yes, Verbal Permission Granted  Name::     [name] Weber  Agency::  SNFs  Relationship::  friend  Contact Information:  [phone]  Housing/Transportation Living arrangements for the past 2 months:  Single Family Home Source of Information:  Patient Patient Interpreter Needed:  None Criminal Activity/Legal Involvement Pertinent to Current Situation/Hospitalization:  No - Comment as needed Significant Relationships:  Merchandiser, retailCommunity Support, Other Family Members Lives with:  Self Do you feel safe going back to the place where you live?  No Need for family participation in patient care:  Yes (Comment)  Care giving concerns:  CSW received referral for possible SNF placement at time of discharge. Spoke with patient and friend at bedside [name] Weber regarding possibility of SNF placement . Patient's family and friends  are currently unable to care for her at their home given patient's current needs and fall risk.  Patient and friend [name] Weber at bedside expressed understanding of PT recommendation and are agreeable to SNF placement at time of discharge. CSW to continue to follow and assist with discharge planning needs.     Social Worker assessment / plan:  Spoke with patient and [name] Weber  concerning possibility of rehab at SNF before returning home.    Employment status:  Retired Database administratornsurance information:  Managed Medicare PT Recommendations:  Skilled Nursing Facility Information / Referral to community resources:  Skilled Nursing Facility  Patient/Family's Response to care:  Patient recognizes need for rehab before returning home and are [place]. [facility] . CSW explained insurance authorization process. CSW provided patient with medicare.gov rating of [name] Weber including quality measures, paper copy put in chart. Patient's family reported that they want patient to get stronger to be able to come back home.    Patient/Family's Understanding of and Emotional Response to Diagnosis, Current Treatment, and Prognosis:  Patient/family is realistic regarding therapy needs and expressed being hopeful for SNF placement. Patient expressed understanding of CSW role and discharge process as well as medical condition. No questions/concerns about plan or treatment.    Emotional Assessment Appearance:  Appears stated age Attitude/Demeanor/Rapport:  Self-Confident, Gracious Affect (typically observed):  Accepting, Adaptable Orientation:  Oriented to Self, Oriented to Place, Oriented to Situation, Oriented to  Time Alcohol / Substance use:  Not Applicable Psych involvement (Current and /or in the community):  No (Comment)  Discharge Needs  Concerns to be addressed:  Discharge Planning Concerns Readmission within the last 30 days:  No Current discharge risk:  Dependent with Mobility Barriers to Discharge:  Continued Medical Work up   Dynegyshley M Jozef Eisenbeis, LCSW 11/07/2018, 3:11 PM

## 2018-11-07 NOTE — Progress Notes (Signed)
Occupational Therapy Treatment Patient Details Name: [name] Weber MRN: [id number] DOB: July 28, 1931 Today's Date: 11/07/2018    History of present illness Pt is an 84 y/o female with a past medical history of Fatigue, Malaise, Myalgia, PAC (premature atrial contraction), Palpitations, Paroxysmal atrial flutter, and PVC's (premature ventricular contractions). Pt sustained a fall at home and was wedged between tub and toilet. Pt became faint and dizzy with initial attempts to ambulate in ED, admitted for further workup. Imaging revealed multiple left rib fractures of the lower ribs.  No pneumothorax.    OT comments  Pt very HOH, able to perform toilet transfer at min guard assist, and manage underwear/pericare min guard. Pt able to perform short sink level grooming. Pt remains a close min guard assist overall and does not have 24 hour care - due to decreased balance, activity tolerance, and access to LB for ADL Pt will benefit/require a short term stay at SNF to maximize safety and independence in ADL and functional transfers.  Follow Up Recommendations  SNF;Supervision/Assistance - 24 hour    Equipment Recommendations  Other (comment)(defer to next venue of care)    Recommendations for Other Services      Precautions / Restrictions Precautions Precautions: Fall Restrictions Weight Bearing Restrictions: No       Mobility Bed Mobility Overal bed mobility: Needs Assistance Bed Mobility: Supine to Sit     Supine to sit: Min assist     General bed mobility comments: Pt receieved in bathroom and returned to recliner  Transfers Overall transfer level: Needs assistance Equipment used: Rolling walker (2 wheeled) Transfers: Sit to/from Stand Sit to Stand: Min guard;Min assist         General transfer comment: light Min A fro safety to rise from toilet    Balance Overall balance assessment: Needs assistance Sitting-balance support: Feet supported Sitting balance-Leahy Scale:  Good     Standing balance support: Single extremity supported;Bilateral upper extremity supported;During functional activity Standing balance-Leahy Scale: Poor Standing balance comment: reliant on UE support for dynamic mobility; pt leaning against sink for increased stability during grooming task                           ADL either performed or assessed with clinical judgement   ADL Overall ADL's : Needs assistance/impaired     Grooming: Min guard;Minimal assistance;Wash/dry hands;Standing Grooming Details (indicate cue type and reason): sink level Upper Body Bathing: Sitting;Set up   Lower Body Bathing: Minimal assistance;Sit to/from stand Lower Body Bathing Details (indicate cue type and reason): from commode Upper Body Dressing : Set up;Sitting Upper Body Dressing Details (indicate cue type and reason): to don hospital gown backwards like robe     Toilet Transfer: Min guard;Minimal assistance;Ambulation;Regular Toilet;Grab bars;RW Statistician Details (indicate cue type and reason): vc for safe hand placement Toileting- Clothing Manipulation and Hygiene: Supervision/safety;Sitting/lateral lean       Functional mobility during ADLs: Min guard;Cueing for sequencing;Rolling walker(hands on for safety - no assist needed)       Vision       Perception     Praxis      Cognition Arousal/Alertness: Awake/alert Behavior During Therapy: WFL for tasks assessed/performed Overall Cognitive Status: Within Functional Limits for tasks assessed                                 General Comments: [facility]  [facility]         Exercises     Shoulder Instructions       General Comments      Pertinent Vitals/ Pain       Pain Assessment: Faces Faces Pain Scale: Hurts little more Pain Location: L ribcage Pain Descriptors / Indicators: Aching;Discomfort;Sharp;Sore Pain Intervention(s): Monitored during session;Repositioned  Home Living                                           Prior Functioning/Environment              Frequency  Min 2X/week        Progress Toward Goals  OT Goals(current goals can now be found in the care plan section)  Progress towards OT goals: Progressing toward goals  Acute Rehab OT Goals Patient Stated Goal: to go home to her dog OT Goal Formulation: With patient Time For Goal Achievement: 11/20/18 Potential to Achieve Goals: Good  Plan Discharge plan needs to be updated;Frequency remains appropriate    Co-evaluation                 AM-PAC OT "6 Clicks" Daily Activity     Outcome Measure   Help from another person eating meals?: None Help from another person taking care of personal grooming?: A Little Help from another person toileting, which includes using toliet, bedpan, or urinal?: A Little Help from another person bathing (including washing, rinsing, drying)?: A Little Help from another person to put on and taking off regular upper body clothing?: None Help from another person to put on and taking off regular lower body clothing?: A Little 6 Click Score: 20    End of Session Equipment Utilized During Treatment: Gait belt;Rolling walker  OT Visit Diagnosis: Unsteadiness on feet (R26.81);Pain Pain - Right/Left: Left Pain - part of body: (ribs)   Activity Tolerance Patient tolerated treatment well   Patient Left in chair;with call bell/phone within reach;with chair alarm set   Nurse Communication Mobility status        Time: 1110-1134 OT Time Calculation (min): 24 min  Charges: OT General Charges $OT Visit: 1 Visit OT Treatments $Self Care/Home Management : 23-37 mins  Sherryl MangesLaura Nykole Matos OTR/L Acute Rehabilitation Services Pager: [phone] Office: [phone]   Evern BioLaura J Reyann Troop 11/07/2018, 1:05 PM

## 2018-11-07 NOTE — Progress Notes (Signed)
Physical Therapy Treatment Patient Details Name: [name] Weber MRN: [id number] DOB: 07/11/1931 Today's Date: 11/07/2018    History of Present Illness Pt is an 84 y/o female with a past medical history of Fatigue, Malaise, Myalgia, PAC (premature atrial contraction), Palpitations, Paroxysmal atrial flutter, and PVC's (premature ventricular contractions). Pt sustained a fall at home and was wedged between tub and toilet. Pt became faint and dizzy with initial attempts to ambulate in ED, admitted for further workup. Imaging revealed multiple left rib fractures of the lower ribs.  No pneumothorax.     PT Comments    Patient seen for mobility progression. Does continue to require Min A/min guard for transfers and mobility. Some increased unsteadiness today with gait requiring Min A for balance with RW. Patient with poor safety/surrounding awareness with at 1 time patient getting sock stuck on leg of RW and unable to fix independently increasing her fall risk. Heavy use of bedrails and grab bars for support during functional transfers. Due to current functional status with need for physical assist for mobility and balance PT to recommend short term SNF at this time to progress safe and independent functional mobility prior to return home. Pt to continue to follow.     Follow Up Recommendations  SNF;Supervision/Assistance - 24 hour     Equipment Recommendations  None recommended by PT    Recommendations for Other Services OT consult(as ordered)     Precautions / Restrictions Precautions Precautions: Fall Restrictions Weight Bearing Restrictions: No    Mobility  Bed Mobility Overal bed mobility: Needs Assistance Bed Mobility: Supine to Sit     Supine to sit: Min assist     General bed mobility comments: minA for trunk elevation; increased time and effort; cues to assist with scooting hips towards EOB  Transfers Overall transfer level: Needs assistance Equipment used: Rolling  walker (2 wheeled) Transfers: Sit to/from Stand Sit to Stand: Min guard;Min assist         General transfer comment: light MIn A to rise from bed and toilet; patient reporting dizziness with mobility-  VSS  Ambulation/Gait Ambulation/Gait assistance: Min assist;Min guard(progressed to MotorolaMin guard ) Gait Distance (Feet): 75 Feet Assistive device: Rolling walker (2 wheeled) Gait Pattern/deviations: Step-through pattern;Decreased stride length;Decreased dorsiflexion - right;Decreased dorsiflexion - left;Trunk flexed Gait velocity: decreased   General Gait Details: poor awareness of self in space - got sock caught under RW and could not figure out how to undo it without assist from PT; continued poor heel strike with foot flat   Stairs             Wheelchair Mobility    Modified Rankin (Stroke Patients Only)       Balance Overall balance assessment: Needs assistance Sitting-balance support: Feet supported Sitting balance-Leahy Scale: Good     Standing balance support: Single extremity supported;Bilateral upper extremity supported;During functional activity Standing balance-Leahy Scale: Poor Standing balance comment: reliant on UE support for dynamic mobility; pt leaning against sink for increased stability during grooming task                            Cognition Arousal/Alertness: Awake/alert Behavior During Therapy: WFL for tasks assessed/performed Overall Cognitive Status: Within Functional Limits for tasks assessed                                 General Comments: requires repetition due to  HOH       Exercises      General Comments        Pertinent Vitals/Pain Pain Assessment: Faces Faces Pain Scale: Hurts even more Pain Location: L ribcage Pain Descriptors / Indicators: Aching;Discomfort;Sharp;Sore Pain Intervention(s): Limited activity within patient's tolerance;Monitored during session;Repositioned    Home Living                       Prior Function            PT Goals (current goals can now be found in the care plan section) Acute Rehab PT Goals Patient Stated Goal: to go home to her dog PT Goal Formulation: With patient Time For Goal Achievement: 11/20/18 Potential to Achieve Goals: Good Progress towards PT goals: Progressing toward goals    Frequency    Min 3X/week      PT Plan Current plan remains appropriate;Discharge plan needs to be updated    Co-evaluation              AM-PAC PT "6 Clicks" Mobility   Outcome Measure  Help needed turning from your back to your side while in a flat bed without using bedrails?: A Little Help needed moving from lying on your back to sitting on the side of a flat bed without using bedrails?: A Little Help needed moving to and from a bed to a chair (including a wheelchair)?: A Little Help needed standing up from a chair using your arms (e.g., wheelchair or bedside chair)?: A Little Help needed to walk in hospital room?: A Little Help needed climbing 3-5 steps with a railing? : A Lot 6 Click Score: 17    End of Session Equipment Utilized During Treatment: Gait belt Activity Tolerance: Patient tolerated treatment well Patient left: in chair;with call bell/phone within reach;with chair alarm set Nurse Communication: Mobility status PT Visit Diagnosis: Unsteadiness on feet (R26.81);Other abnormalities of gait and mobility (R26.89);Muscle weakness (generalized) (M62.81);History of falling (Z91.81)     Time: 1610-9604 PT Time Calculation (min) (ACUTE ONLY): 30 min  Charges:  $Gait Training: 8-22 mins $Therapeutic Activity: 8-22 mins                     Kipp Laurence, PT, DPT Supplemental Physical Therapist 11/07/18 11:36 AM Pager: [phone] Office: [phone]

## 2018-11-07 NOTE — Progress Notes (Signed)
Progress Note    POLETTE NOFSINGER  ZOX:[id number] DOB: 11-28-1931  DOA: 11/05/2018 PCP: Marden Noble, MD    Brief Narrative:    Medical records reviewed and are as summarized below:  [name] Weber is an 84 y.o. female with medical history significant of fatigue, malaise, paroxysmal atrial fibrillation (not on anticoagulation) occasional palpitations osteoporosis, anxiety disorder, pulmonary hypertension, and remote pelvic fracture who presents to the emergency department brought in by EMS after being wedged between the toilet and the tub when she left the toilet.   Assessment/Plan:   Principal Problem:   Multiple rib fractures Active Problems:   Fatigue   Benign hypertensive heart disease without heart failure   HLD (hyperlipidemia)   Weakness of both legs   Near syncope   A-fib (HCC)   Osteoporosis with current pathological fracture   Chronic midline low back pain without sciatica   Cystitis  Multiple rib fractures:  - incentive spirometer  -Tylenol around-the-clock -PRN pain medications   Weakness/neuropathy of both legs associated with near syncopal event:  -Patient has history of weakness in her legs and spinal disorders she also had a bleed a long time ago in her spinal cord -dg lumbar spine w/o acute issues-- patient's story is different today than yesterday B12: lower end of normal-- replace PO  E coli UTI -ceftriaxone in the emergency department.x1   -Keflex 500 p.o. X 2 days for total of 3 days treatment  Labile BP charted with positive orthostatics -suspect high is from pain -restart continue IVF and recheck orthos in AM -unable to use TED hose with skin tears   Atrial fibrillation not on anticoagulation.  Notes from cardiology reflect that she has been offered therapy including anticoagulation but the patient is not interested in taking a blood thinner which in light of her fall is approriate  B/L lower leg skin tears -seen by WOC -xeroform  gauze to minimize adherence and discomfort with dressing changes, foam dressing to protect from further injury.     Family Communication/Anticipated D/C date and plan/Code Status   DVT prophylaxis: Lovenox ordered. Code Status: Full Code.  Family Communication: neighbor/MPOA at bedside Disposition Plan: needs SNF    Medical Consultants:     Subjective:   Numbness in not in thighs but now b/l lower legs  Objective:    Vitals:   11/06/18 1510 11/06/18 2133 11/07/18 0445 11/07/18 1315  BP: (!) 145/91 (!) 156/103 (!) 177/89 107/71  Pulse: 80 79 (!) 59 83  Resp: 17 18 20 18   Temp: 98.1 F (36.7 C) 98.9 F (37.2 C) 98.6 F (37 C) 97.6 F (36.4 C)  TempSrc: Rectal Oral Oral Oral  SpO2: 97% 97% 96% 98%    Intake/Output Summary (Last 24 hours) at 11/07/2018 1416 Last data filed at 11/07/2018 0900 Gross per 24 hour  Intake 240 ml  Output 601 ml  Net -361 ml   There were no vitals filed for this visit.  Exam: Very hard of hearing  Data Reviewed:   I have personally reviewed following labs and imaging studies:  Labs: Labs show the following:   Basic Metabolic Panel: Recent Labs  Lab 11/05/18 0748 11/05/18 1229 11/07/18 0311  NA 142  --  141  K 4.2  --  3.9  CL 101  --  108  CO2 24  --  25  GLUCOSE 117*  --  98  BUN 17  --  10  CREATININE 0.88 0.72 0.65  CALCIUM 9.4  --  8.5*   GFR CrCl cannot be calculated (Unknown ideal weight.). Liver Function Tests: No results for input(s): AST, ALT, ALKPHOS, BILITOT, PROT, ALBUMIN in the last 168 hours. No results for input(s): LIPASE, AMYLASE in the last 168 hours. No results for input(s): AMMONIA in the last 168 hours. Coagulation profile No results for input(s): INR, PROTIME in the last 168 hours.  CBC: Recent Labs  Lab 11/05/18 0748 11/05/18 1229 11/07/18 0311  WBC 12.4* 9.8 7.8  NEUTROABS 10.8*  --   --   HGB 15.3* 14.0 13.4  HCT 48.6* 45.0 41.7  MCV 100.8* 101.8* 99.3  PLT 168 166 141*   Cardiac  Enzymes: No results for input(s): CKTOTAL, CKMB, CKMBINDEX, TROPONINI in the last 168 hours. BNP (last 3 results) No results for input(s): PROBNP in the last 8760 hours. CBG: No results for input(s): GLUCAP in the last 168 hours. D-Dimer: No results for input(s): DDIMER in the last 72 hours. Hgb A1c: No results for input(s): HGBA1C in the last 72 hours. Lipid Profile: No results for input(s): CHOL, HDL, LDLCALC, TRIG, CHOLHDL, LDLDIRECT in the last 72 hours. Thyroid function studies: No results for input(s): TSH, T4TOTAL, T3FREE, THYROIDAB in the last 72 hours.  Invalid input(s): FREET3 Anemia work up: Recent Labs    11/07/18 0311  VITAMINB12 193   Sepsis Labs: Recent Labs  Lab 11/05/18 0748 11/05/18 1229 11/07/18 0311  WBC 12.4* 9.8 7.8    Microbiology Recent Results (from the past 240 hour(s))  Urine Culture     Status: Abnormal   Collection Time: 11/05/18 10:50 AM  Result Value Ref Range Status   Specimen Description URINE, RANDOM  Final   Special Requests   Final    NONE Performed at [facility], [address] [place], [place] [address]    Culture >=100,000 COLONIES/mL ESCHERICHIA COLI (A)  Final   Report Status 11/07/2018 FINAL  Final   Organism ID, Bacteria ESCHERICHIA COLI (A)  Final      Susceptibility   Escherichia coli - MIC*    AMPICILLIN >=32 RESISTANT Resistant     CEFAZOLIN <=4 SENSITIVE Sensitive     CEFTRIAXONE <=1 SENSITIVE Sensitive     CIPROFLOXACIN <=0.25 SENSITIVE Sensitive     GENTAMICIN <=1 SENSITIVE Sensitive     IMIPENEM <=0.25 SENSITIVE Sensitive     NITROFURANTOIN <=16 SENSITIVE Sensitive     TRIMETH/SULFA <=20 SENSITIVE Sensitive     AMPICILLIN/SULBACTAM 16 INTERMEDIATE Intermediate     PIP/TAZO <=4 SENSITIVE Sensitive     Extended ESBL NEGATIVE Sensitive     * >=100,000 COLONIES/mL ESCHERICHIA COLI    Procedures and diagnostic studies:  Dg Lumbar Spine 2-3 Views  Result Date: 11/06/2018 CLINICAL DATA:  Low back  pain. Multiple recent falls. EXAM: LUMBAR SPINE - 2-3 VIEW COMPARISON:  08/10/2016. FINDINGS: Non-rib-bearing lumbar vertebrae. Stable T12 through L2 laminectomy defects. Stable 25% L2 superior endplate compression deformity with no significant bony retropulsion and no acute fracture lines. Stable facet degenerative changes in the mid and lower lumbar spine with associated stable grade 1 anterolisthesis at the L5-S1 level. No pars defects or acute fractures. And inferior vena cava filter is again demonstrated with its tip at the inferior L2 level. IMPRESSION: No acute abnormality. Stable degenerative and postoperative changes. Electronically Signed   By: Beckie Salts M.D.   On: 11/06/2018 18:54    Medications:   . acetaminophen  1,000 mg Oral TID  . acidophilus  1 capsule Oral Daily  . aspirin EC  81 mg Oral Daily  . cephALEXin  500 mg Oral Q12H  . diclofenac sodium  4 g Topical QID  . digoxin  125 mcg Oral Daily  . enoxaparin (LOVENOX) injection  40 mg Subcutaneous Q24H  . latanoprost  1 drop Both Eyes QHS  . vitamin B-12  500 mcg Oral Daily   Continuous Infusions: . sodium chloride 50 mL/hr at 11/07/18 1227     LOS: 0 days   Joseph ArtJessica U Jomaira Darr  [facility]   *Please refer to [email or web address], password [email or web address] to get updated schedule on who will round on this patient, as hospitalists switch teams weekly. If 7PM-7AM, please contact night-coverage at [email or web address], password [email or web address] for any overnight needs.  11/07/2018, 2:16 PM

## 2018-11-07 NOTE — Consult Note (Addendum)
WOC Nurse wound consult note Reason for Consult: Consult requested for bilat legs.  Pt had a fall prior to admission and has full thickness skin tears to bilat anterior shins. Measurement: Left leg with skin approximated over 50% of skin, old dark red dried blood and scabs to wound edges, 50% red and moist, small amt bleeding, no odor or fluctuance.  Approx 9X6X.2cm Right leg 2X.5X.2cm, small amt bleeding, red moist wound bed, no odor or fluctuance. Previous dressing was dry and adhered to the wound bed.  Dressing procedure/placement/frequency: Removed and replaced xeroform gauze to minimize adherence and discomfort with dressing changes, foam dressing to protect from further injury.  Discussed plan of care with patient, no family available at the bedside. Please re-consult if further assistance is needed.  Thank-you,  Cammie Mcgeeawn Abdulrahim Siddiqi MSN, RN, CWOCN, FredericaWCN-AP, CNS [phone]

## 2018-11-08 DIAGNOSIS — E538 Deficiency of other specified B group vitamins: Secondary | ICD-10-CM

## 2018-11-08 DIAGNOSIS — I951 Orthostatic hypotension: Secondary | ICD-10-CM

## 2018-11-08 DIAGNOSIS — S2242XA Multiple fractures of ribs, left side, initial encounter for closed fracture: Secondary | ICD-10-CM | POA: Diagnosis not present

## 2018-11-08 DIAGNOSIS — N390 Urinary tract infection, site not specified: Secondary | ICD-10-CM | POA: Diagnosis not present

## 2018-11-08 HISTORY — DX: Deficiency of other specified B group vitamins: E53.8

## 2018-11-08 MED ORDER — DOCUSATE SODIUM 100 MG PO CAPS
100.0000 mg | ORAL_CAPSULE | Freq: Every day | ORAL | Status: DC
  Administered 2018-11-08 – 2018-11-12 (×5): 100 mg via ORAL
  Filled 2018-11-08 (×5): qty 1

## 2018-11-08 MED ORDER — TRAMADOL HCL 50 MG PO TABS
50.0000 mg | ORAL_TABLET | Freq: Four times a day (QID) | ORAL | Status: DC | PRN
  Administered 2018-11-09 – 2018-11-11 (×3): 50 mg via ORAL
  Filled 2018-11-08 (×4): qty 1

## 2018-11-08 MED ORDER — BISACODYL 10 MG RE SUPP: 10.0000 mg | Freq: Every day | RECTAL | Status: DC | PRN

## 2018-11-08 MED ORDER — LIDOCAINE 5 % EX PTCH
1.0000 | MEDICATED_PATCH | CUTANEOUS | Status: DC
  Administered 2018-11-08 – 2018-11-19 (×12): 1 via TRANSDERMAL
  Filled 2018-11-08 (×12): qty 1

## 2018-11-08 NOTE — Plan of Care (Signed)
  Problem: Education: Goal: Knowledge of General Education information will improve Description Including pain rating scale, medication(s)/side effects and non-pharmacologic comfort measures Outcome: Progressing   Problem: Clinical Measurements: Goal: Ability to maintain clinical measurements within normal limits will improve Outcome: Progressing   Problem: Activity: Goal: Risk for activity intolerance will decrease Outcome: Progressing   Problem: Elimination: Goal: Will not experience complications related to bowel motility Outcome: Progressing   Problem: Safety: Goal: Ability to remain free from injury will improve Outcome: Progressing   Problem: Skin Integrity: Goal: Risk for impaired skin integrity will decrease Outcome: Progressing   

## 2018-11-08 NOTE — Plan of Care (Signed)
  Problem: Clinical Measurements: Goal: Will remain free from infection Outcome: Progressing   Problem: Education: Goal: Knowledge of General Education information will improve Description Including pain rating scale, medication(s)/side effects and non-pharmacologic comfort measures Outcome: Progressing   Problem: Coping: Goal: Level of anxiety will decrease Outcome: Progressing   Problem: Safety: Goal: Ability to remain free from injury will improve Outcome: Progressing   Problem: Skin Integrity: Goal: Risk for impaired skin integrity will decrease Outcome: Progressing

## 2018-11-08 NOTE — Progress Notes (Signed)
Progress Note    [name] Policevelyn C Kneece  ZOX:[id number] DOB: 10/10/1931  DOA: 11/05/2018 PCP: Marden NobleGates, Robert, MD    Brief Narrative:    Medical records reviewed and are as summarized below:  [name] Weber is an 84 y.o. female with medical history significant of fatigue, malaise, paroxysmal atrial fibrillation (not on anticoagulation) occasional palpitations osteoporosis, anxiety disorder, pulmonary hypertension, and remote pelvic fracture who presents to the emergency department brought in by EMS after being wedged between the toilet and the tub when she left the toilet.  Found to have a UTI as well as rib fractures and b/l LE skin tears  Assessment/Plan:   Principal Problem:   Multiple rib fractures Active Problems:   Fatigue   Benign hypertensive heart disease without heart failure   HLD (hyperlipidemia)   Weakness of both legs   Near syncope   A-fib (HCC)   Osteoporosis with current pathological fracture   Chronic midline low back pain without sciatica   Cystitis  Multiple rib fractures:  - incentive spirometer  -Tylenol around-the-clock -PRN pain medications-- unfortunately the oxycodone seems to be making her nauseous-- will try ultram and topical lidocaine in place of Voltaren gel which did not seem to help -bowel regimen while getting pain medications   Weakness/neuropathy of both legs associated with near syncopal event:  -Patient has history of weakness in her legs and spinal disorders she also had a bleed a long time ago in her spinal cord -dg lumbar spine w/o acute issues-- patient's story is different today than yesterday- lower part of lower extremities numb vs thigh the day prior B12: lower end of normal-- replace PO  E coli UTI -ceftriaxone in the emergency department.x1   -Keflex 500 p.o. X 2 days for total of 3 days treatment  Labile BP charted with positive orthostatics -suspect highs are from pain -restart continue IVF and recheck orthos in AM--  continue to be positive -unable to use TED hose with skin tears   Atrial fibrillation not on anticoagulation.  Notes from cardiology reflect that she has been offered therapy including anticoagulation but the patient is not interested in taking a blood thinner which in light of her fall is approriate  B/L lower leg skin tears -seen by WOC -xeroform gauze to minimize adherence and discomfort with dressing changes, foam dressing to protect from further injury.     Family Communication/Anticipated D/C date and plan/Code Status   DVT prophylaxis: Lovenox ordered. Code Status: Full Code.  Family Communication: neighbor/MPOA at bedside 12/09 Disposition Plan: needs SNF    Medical Consultants:     Subjective:   C/o nausea this AM, not able to eat, much  Objective:    Vitals:   11/07/18 0445 11/07/18 1315 11/07/18 1954 11/08/18 0426  BP: (!) 177/89 107/71 125/74 (!) 161/79  Pulse: (!) 59 83 69 (!) 59  Resp: 20 18 18 14   Temp: 98.6 F (37 C) 97.6 F (36.4 C) 98.4 F (36.9 C) 98.4 F (36.9 C)  TempSrc: Oral Oral Oral Oral  SpO2: 96% 98% 98% 97%    Intake/Output Summary (Last 24 hours) at 11/08/2018 0825 Last data filed at 11/07/2018 1808 Gross per 24 hour  Intake 595.49 ml  Output 601 ml  Net -5.51 ml   There were no vitals filed for this visit.  Exam: Skin warm and dry Appears uncomfortable Hard of hearing B/l lower ext with foam covering-- no redness around +Bs, soft NT  Data Reviewed:   I have  personally reviewed following labs and imaging studies:  Labs: Labs show the following:   Basic Metabolic Panel: Recent Labs  Lab 11/05/18 0748 11/05/18 1229 11/07/18 0311  NA 142  --  141  K 4.2  --  3.9  CL 101  --  108  CO2 24  --  25  GLUCOSE 117*  --  98  BUN 17  --  10  CREATININE 0.88 0.72 0.65  CALCIUM 9.4  --  8.5*   GFR CrCl cannot be calculated (Unknown ideal weight.). Liver Function Tests: No results for input(s): AST, ALT, ALKPHOS,  BILITOT, PROT, ALBUMIN in the last 168 hours. No results for input(s): LIPASE, AMYLASE in the last 168 hours. No results for input(s): AMMONIA in the last 168 hours. Coagulation profile No results for input(s): INR, PROTIME in the last 168 hours.  CBC: Recent Labs  Lab 11/05/18 0748 11/05/18 1229 11/07/18 0311  WBC 12.4* 9.8 7.8  NEUTROABS 10.8*  --   --   HGB 15.3* 14.0 13.4  HCT 48.6* 45.0 41.7  MCV 100.8* 101.8* 99.3  PLT 168 166 141*   Cardiac Enzymes: No results for input(s): CKTOTAL, CKMB, CKMBINDEX, TROPONINI in the last 168 hours. BNP (last 3 results) No results for input(s): PROBNP in the last 8760 hours. CBG: No results for input(s): GLUCAP in the last 168 hours. D-Dimer: No results for input(s): DDIMER in the last 72 hours. Hgb A1c: No results for input(s): HGBA1C in the last 72 hours. Lipid Profile: No results for input(s): CHOL, HDL, LDLCALC, TRIG, CHOLHDL, LDLDIRECT in the last 72 hours. Thyroid function studies: No results for input(s): TSH, T4TOTAL, T3FREE, THYROIDAB in the last 72 hours.  Invalid input(s): FREET3 Anemia work up: Recent Labs    11/07/18 0311  VITAMINB12 193   Sepsis Labs: Recent Labs  Lab 11/05/18 0748 11/05/18 1229 11/07/18 0311  WBC 12.4* 9.8 7.8    Microbiology Recent Results (from the past 240 hour(s))  Urine Culture     Status: Abnormal   Collection Time: 11/05/18 10:50 AM  Result Value Ref Range Status   Specimen Description URINE, RANDOM  Final   Special Requests   Final    NONE Performed at [facility], [address] [place], [place] [address]    Culture >=100,000 COLONIES/mL ESCHERICHIA COLI (A)  Final   Report Status 11/07/2018 FINAL  Final   Organism ID, Bacteria ESCHERICHIA COLI (A)  Final      Susceptibility   Escherichia coli - MIC*    AMPICILLIN >=32 RESISTANT Resistant     CEFAZOLIN <=4 SENSITIVE Sensitive     CEFTRIAXONE <=1 SENSITIVE Sensitive     CIPROFLOXACIN <=0.25 SENSITIVE Sensitive       GENTAMICIN <=1 SENSITIVE Sensitive     IMIPENEM <=0.25 SENSITIVE Sensitive     NITROFURANTOIN <=16 SENSITIVE Sensitive     TRIMETH/SULFA <=20 SENSITIVE Sensitive     AMPICILLIN/SULBACTAM 16 INTERMEDIATE Intermediate     PIP/TAZO <=4 SENSITIVE Sensitive     Extended ESBL NEGATIVE Sensitive     * >=100,000 COLONIES/mL ESCHERICHIA COLI    Procedures and diagnostic studies:  Dg Lumbar Spine 2-3 Views  Result Date: 11/06/2018 CLINICAL DATA:  Low back pain. Multiple recent falls. EXAM: LUMBAR SPINE - 2-3 VIEW COMPARISON:  08/10/2016. FINDINGS: Non-rib-bearing lumbar vertebrae. Stable T12 through L2 laminectomy defects. Stable 25% L2 superior endplate compression deformity with no significant bony retropulsion and no acute fracture lines. Stable facet degenerative changes in the mid and lower  lumbar spine with associated stable grade 1 anterolisthesis at the L5-S1 level. No pars defects or acute fractures. And inferior vena cava filter is again demonstrated with its tip at the inferior L2 level. IMPRESSION: No acute abnormality. Stable degenerative and postoperative changes. Electronically Signed   By: Beckie Salts M.D.   On: 11/06/2018 18:54    Medications:   . acetaminophen  1,000 mg Oral TID  . acidophilus  1 capsule Oral Daily  . aspirin EC  81 mg Oral Daily  . cephALEXin  500 mg Oral Q12H  . diclofenac sodium  4 g Topical QID  . digoxin  125 mcg Oral Daily  . docusate sodium  100 mg Oral Daily  . enoxaparin (LOVENOX) injection  40 mg Subcutaneous Q24H  . latanoprost  1 drop Both Eyes QHS  . vitamin B-12  500 mcg Oral Daily   Continuous Infusions: . sodium chloride 50 mL/hr at 11/07/18 1227     LOS: 0 days   Joseph Art  [facility]   *Please refer to [email or web address], password [email or web address] to get updated schedule on who will round on this patient, as hospitalists switch teams weekly. If 7PM-7AM, please contact night-coverage at [email or web address], password [email or web address] for any overnight  needs.  11/08/2018, 8:25 AM

## 2018-11-09 ENCOUNTER — Other Ambulatory Visit: Payer: Self-pay

## 2018-11-09 DIAGNOSIS — R29898 Other symptoms and signs involving the musculoskeletal system: Secondary | ICD-10-CM | POA: Diagnosis not present

## 2018-11-09 DIAGNOSIS — S81812A Laceration without foreign body, left lower leg, initial encounter: Secondary | ICD-10-CM | POA: Diagnosis not present

## 2018-11-09 DIAGNOSIS — N390 Urinary tract infection, site not specified: Secondary | ICD-10-CM | POA: Diagnosis not present

## 2018-11-09 DIAGNOSIS — S2242XA Multiple fractures of ribs, left side, initial encounter for closed fracture: Secondary | ICD-10-CM | POA: Diagnosis not present

## 2018-11-09 DIAGNOSIS — R55 Syncope and collapse: Secondary | ICD-10-CM

## 2018-11-09 LAB — CBC
HCT: 40.7 % (ref 36.0–46.0)
Hemoglobin: 12.7 g/dL (ref 12.0–15.0)
MCH: 31.4 pg (ref 26.0–34.0)
MCHC: 31.2 g/dL (ref 30.0–36.0)
MCV: 100.5 fL — ABNORMAL HIGH (ref 80.0–100.0)
Platelets: 165 10*3/uL (ref 150–400)
RBC: 4.05 MIL/uL (ref 3.87–5.11)
RDW: 12 % (ref 11.5–15.5)
WBC: 5.8 10*3/uL (ref 4.0–10.5)
nRBC: 0 % (ref 0.0–0.2)

## 2018-11-09 LAB — BASIC METABOLIC PANEL
Anion gap: 8 (ref 5–15)
BUN: 20 mg/dL (ref 8–23)
CO2: 28 mmol/L (ref 22–32)
Calcium: 8.8 mg/dL — ABNORMAL LOW (ref 8.9–10.3)
Chloride: 105 mmol/L (ref 98–111)
Creatinine, Ser: 0.77 mg/dL (ref 0.44–1.00)
GFR calc Af Amer: >60 mL/min (ref >60)
GFR calc non Af Amer: >60 mL/min (ref >60)
Glucose, Bld: 94 mg/dL (ref 70–99)
Potassium: 4 mmol/L (ref 3.5–5.1)
Sodium: 141 mmol/L (ref 135–145)

## 2018-11-09 MED ORDER — AMLODIPINE BESYLATE 5 MG PO TABS
5.0000 mg | ORAL_TABLET | Freq: Every day | ORAL | Status: DC
  Administered 2018-11-09 – 2018-11-12 (×4): 5 mg via ORAL
  Filled 2018-11-09 (×4): qty 1

## 2018-11-09 NOTE — Plan of Care (Signed)
  Problem: Safety: Goal: Ability to remain free from injury will improve Outcome: Progressing   

## 2018-11-09 NOTE — Progress Notes (Signed)
PROGRESS NOTE  [name] Weber ZOX:[id number] DOB: [birth year] DOA: 11/05/2018 PCP: Marden Noble, MD  HPI/Brief Narrative  [name] Weber is a 84 y.o. year old female with medical history significant for fatigue, malaise, paroxysmal atrial fibrillation (not on anticoagulation) occasional palpitations osteoporosis, anxiety disorder, pulmonary hypertension, and remote pelvic fracture  who presented on 11/05/2018 to the ED after being found wedged between the toilet and her tub.  Upon discussion she states somehow she slid off the toilet denied any preceding signs or symptoms.  She became faint and dizzy upon initial attempt to ambulate in the ED prompting admission for further work-up.  She was found to have abnormal urinalysis with E. coli on urine culture, she does not remember any urinary symptoms but here has had urinary frequency concerning for UTI as well as multiple rib fractures on bilateral lower extremity skin tears in the setting of fall.    Subjective Anxious about going home  Assessment/Plan:  #Minimally displaced lower left rib fractures, stable.  Focusing on pain control, seems to be doing well with lidocaine patch (started 12/10), PRN tramadol.  Supportive care with incentive spirometry  #Unwitnessed fall.  Unclear etiology, weird setting of potential mechanical fall.  ED recommends SNF for continued therapy with mobility given balance deficits.  Check orthostatics (has remained positive evaluation)  #Hypertension, not at goal.  Not on any blood pressure medications at home.  SBP 160s, may have some effect due to pain but will start amlodipine 5 mg since she has had a peak SBP of 179  #E. coli UTI, continue Keflex regimen.  Patient unable to report any previous urinary symptoms prior to hospital stay but does report urinary frequency while here  #Bilateral lower extremity skin tears, stable.  Dressings in place.  Continue to monitor.  #Paroxysmal atrial flutter, rate  controlled.  Continue home digoxin.  Not on any anticoagulation as outpatient.  Code Status: Full code  Family Communication: No family at bedside will discuss with her POA per her request  Disposition Plan: Better BP control, resolution of orthostatics especially since patient will be going to skilled nursing facility which will require further physical therapy     Antimicrobials: Anti-infectives (From admission, onward)   Start     Dose/Rate Route Frequency Ordered Stop   11/06/18 2200  cephALEXin (KEFLEX) capsule 500 mg     500 mg Oral Every 12 hours 11/06/18 1224     11/06/18 0600  cephALEXin (KEFLEX) capsule 500 mg  Status:  Discontinued     500 mg Oral Every 8 hours 11/05/18 1140 11/06/18 1224   11/05/18 1015  cefTRIAXone (ROCEPHIN) 1 g in sodium chloride 0.9 % 100 mL IVPB     1 g 200 mL/hr over 30 Minutes Intravenous  Once 11/05/18 1011 11/05/18 1054          DVT prophylaxis: lovenox   Objective: Vitals:   11/09/18 0420 11/09/18 0958 11/09/18 1100 11/09/18 1900  BP: (!) 152/94 (!) 160/118    Pulse: 67 76    Resp: 14 16    Temp: 98.2 F (36.8 C) 97.6 F (36.4 C)    TempSrc: Oral Oral    SpO2: 100% 97%    Weight:    54.6 kg  Height:   5\' 8"  (1.727 m)     Intake/Output Summary (Last 24 hours) at 11/09/2018 2000 Last data filed at 11/09/2018 1920 Gross per 24 hour  Intake 480 ml  Output 1400 ml  Net -920 ml  Filed Weights   11/09/18 1900  Weight: 54.6 kg    Exam:  Constitutional: Elderly female, no distress Eyes: EOMI, anicteric, normal conjunctivae ENMT: Oropharynx with moist mucous membranes, normal dentition Cardiovascular: RRR no MRGs, with no peripheral edema Respiratory: Normal respiratory effort, clear breath sounds  Abdomen: Soft,non-tender, with no HSM Skin: Bruising along lateral side of left rib cage, tenderness to palpation of left rib cage Neurologic: Grossly no focal neuro deficit. Psychiatric:Appropriate affect, and mood. Mental  status AAOx3  Data Reviewed: CBC: Recent Labs  Lab 11/05/18 0748 11/05/18 1229 11/07/18 0311 11/09/18 0229  WBC 12.4* 9.8 7.8 5.8  NEUTROABS 10.8*  --   --   --   HGB 15.3* 14.0 13.4 12.7  HCT 48.6* 45.0 41.7 40.7  MCV 100.8* 101.8* 99.3 100.5*  PLT 168 166 141* 165   Basic Metabolic Panel: Recent Labs  Lab 11/05/18 0748 11/05/18 1229 11/07/18 0311 11/09/18 0229  NA 142  --  141 141  K 4.2  --  3.9 4.0  CL 101  --  108 105  CO2 24  --  25 28  GLUCOSE 117*  --  98 94  BUN 17  --  10 20  CREATININE 0.88 0.72 0.65 0.77  CALCIUM 9.4  --  8.5* 8.8*   GFR: Estimated Creatinine Clearance: 44.3 mL/min (by C-G formula based on SCr of 0.77 mg/dL). Liver Function Tests: No results for input(s): AST, ALT, ALKPHOS, BILITOT, PROT, ALBUMIN in the last 168 hours. No results for input(s): LIPASE, AMYLASE in the last 168 hours. No results for input(s): AMMONIA in the last 168 hours. Coagulation Profile: No results for input(s): INR, PROTIME in the last 168 hours. Cardiac Enzymes: No results for input(s): CKTOTAL, CKMB, CKMBINDEX, TROPONINI in the last 168 hours. BNP (last 3 results) No results for input(s): PROBNP in the last 8760 hours. HbA1C: No results for input(s): HGBA1C in the last 72 hours. CBG: No results for input(s): GLUCAP in the last 168 hours. Lipid Profile: No results for input(s): CHOL, HDL, LDLCALC, TRIG, CHOLHDL, LDLDIRECT in the last 72 hours. Thyroid Function Tests: No results for input(s): TSH, T4TOTAL, FREET4, T3FREE, THYROIDAB in the last 72 hours. Anemia Panel: Recent Labs    11/07/18 0311  VITAMINB12 193   Urine analysis:    Component Value Date/Time   COLORURINE YELLOW 11/05/2018 0939   APPEARANCEUR CLOUDY (A) 11/05/2018 0939   LABSPEC 1.018 11/05/2018 0939   PHURINE 5.0 11/05/2018 0939   GLUCOSEU NEGATIVE 11/05/2018 0939   HGBUR MODERATE (A) 11/05/2018 0939   BILIRUBINUR NEGATIVE 11/05/2018 0939   KETONESUR 5 (A) 11/05/2018 0939    PROTEINUR 30 (A) 11/05/2018 0939   UROBILINOGEN 0.2 01/23/2015 0959   NITRITE POSITIVE (A) 11/05/2018 0939   LEUKOCYTESUR TRACE (A) 11/05/2018 0939   Sepsis Labs: @LABRCNTIP (procalcitonin:4,lacticidven:4)  ) Recent Results (from the past 240 hour(s))  Urine Culture     Status: Abnormal   Collection Time: 11/05/18 10:50 AM  Result Value Ref Range Status   Specimen Description URINE, RANDOM  Final   Special Requests   Final    NONE Performed at [facility], [address] [place], [place] [address]    Culture >=100,000 COLONIES/mL ESCHERICHIA COLI (A)  Final   Report Status 11/07/2018 FINAL  Final   Organism ID, Bacteria ESCHERICHIA COLI (A)  Final      Susceptibility   Escherichia coli - MIC*    AMPICILLIN >=32 RESISTANT Resistant     CEFAZOLIN <=4 SENSITIVE Sensitive  CEFTRIAXONE <=1 SENSITIVE Sensitive     CIPROFLOXACIN <=0.25 SENSITIVE Sensitive     GENTAMICIN <=1 SENSITIVE Sensitive     IMIPENEM <=0.25 SENSITIVE Sensitive     NITROFURANTOIN <=16 SENSITIVE Sensitive     TRIMETH/SULFA <=20 SENSITIVE Sensitive     AMPICILLIN/SULBACTAM 16 INTERMEDIATE Intermediate     PIP/TAZO <=4 SENSITIVE Sensitive     Extended ESBL NEGATIVE Sensitive     * >=100,000 COLONIES/mL ESCHERICHIA COLI      Studies: No results found.  Scheduled Meds: . acetaminophen  1,000 mg Oral TID  . acidophilus  1 capsule Oral Daily  . aspirin EC  81 mg Oral Daily  . cephALEXin  500 mg Oral Q12H  . digoxin  125 mcg Oral Daily  . docusate sodium  100 mg Oral Daily  . enoxaparin (LOVENOX) injection  40 mg Subcutaneous Q24H  . latanoprost  1 drop Both Eyes QHS  . lidocaine  1 patch Transdermal Q24H  . vitamin B-12  500 mcg Oral Daily    Continuous Infusions:   LOS: 1 day     Laverna Peace, MD [facility] Pager [phone]  If 7PM-7AM, please contact night-coverage [email or web address] Password [email or web address] 11/09/2018, 8:00 PM

## 2018-11-09 NOTE — Progress Notes (Signed)
Physical Therapy Treatment Note  Patient seen for mobility progression. Pt is pleasant and eager to participate in therapy and return to PLOF. Pt continues to demonstrate balance deficits and requires min guard/min A for functional transfers and gait training. Pt tolerated increased activity but does continue to c/o painful R side ribs and R hip/groin area. Current plan remains appropriate.    11/09/18 1112  PT Visit Information  Last PT Received On 11/09/18  Assistance Needed +1  History of Present Illness Pt is an 84 y/o female with a past medical history of Fatigue, Malaise, Myalgia, PAC (premature atrial contraction), Palpitations, Paroxysmal atrial flutter, and PVC's (premature ventricular contractions). Pt sustained a fall at home and was wedged between tub and toilet. Pt became faint and dizzy with initial attempts to ambulate in ED, admitted for further workup. Imaging revealed multiple left rib fractures of the lower ribs.  No pneumothorax.   Subjective Data  Patient Stated Goal to go home to her dog  Precautions  Precautions Fall  Pain Assessment  Pain Assessment Faces  Faces Pain Scale 4  Pain Location R side ribs and R hip/groin area  Pain Descriptors / Indicators Discomfort;Sore;Guarding  Pain Intervention(s) Limited activity within patient's tolerance;Monitored during session;Premedicated before session;Repositioned  Cognition  Arousal/Alertness Awake/alert  Behavior During Therapy WFL for tasks assessed/performed  Overall Cognitive Status Within Functional Limits for tasks assessed  General Comments HOH  Bed Mobility  Overal bed mobility Needs Assistance  Bed Mobility Supine to Sit  Supine to sit Min assist  General bed mobility comments assistance required to bring Rhip to EOB; use of rail and HOB elevated  Transfers  Overall transfer level Needs assistance  Equipment used Rolling walker (2 wheeled)  Transfers Sit to/from Stand  Sit to Stand Min assist  General  transfer comment cues for safe hand placement; assist to steady when standing and sitting; poor eccentric loading   Ambulation/Gait  Ambulation/Gait assistance Min guard;Min assist  Gait Distance (Feet) 100 Feet  Assistive device Rolling walker (2 wheeled)  Gait Pattern/deviations Step-through pattern;Decreased stride length;Decreased dorsiflexion - right;Decreased dorsiflexion - left;Trunk flexed  General Gait Details pt with poor proprioception and assist needed to steady; no LOB   Gait velocity decreased  Balance  Overall balance assessment Needs assistance  Sitting-balance support Feet supported  Sitting balance-Leahy Scale Good  Standing balance support Single extremity supported;Bilateral upper extremity supported;During functional activity  Standing balance-Leahy Scale Poor  Exercises  Exercises General Lower Extremity  General Exercises - Lower Extremity  Ankle Circles/Pumps Both;20 reps  Long Arc Quad Both;Strengthening;10 reps;Seated  Hip ABduction/ADduction Strengthening;Both;10 reps;Seated  Hip Flexion/Marching Strengthening;Both;10 reps;Seated  PT - End of Session  Equipment Utilized During Treatment Gait belt  Activity Tolerance Patient tolerated treatment well  Patient left in chair;with call bell/phone within reach;with chair alarm set  Nurse Communication Mobility status   PT - Assessment/Plan  PT Plan Current plan remains appropriate;Discharge plan needs to be updated  PT Visit Diagnosis Unsteadiness on feet (R26.81);Other abnormalities of gait and mobility (R26.89);Muscle weakness (generalized) (M62.81);History of falling (Z91.81)  PT Frequency (ACUTE ONLY) Min 3X/week  Recommendations for Other Services OT consult (as ordered)  Follow Up Recommendations SNF;Supervision/Assistance - 24 hour  PT equipment None recommended by PT  AM-PAC PT "6 Clicks" Mobility Outcome Measure (Version 2)  Help needed turning from your back to your side while in a flat bed without  using bedrails? 3  Help needed moving from lying on your back to sitting on the side  of a flat bed without using bedrails? 3  Help needed moving to and from a bed to a chair (including a wheelchair)? 3  Help needed standing up from a chair using your arms (e.g., wheelchair or bedside chair)? 3  Help needed to walk in hospital room? 3  Help needed climbing 3-5 steps with a railing?  2  6 Click Score 17  Consider Recommendation of Discharge To: Home with HH  PT Goal Progression  Progress towards PT goals Progressing toward goals  PT Time Calculation  PT Start Time (ACUTE ONLY) 1030  PT Stop Time (ACUTE ONLY) 1100  PT Time Calculation (min) (ACUTE ONLY) 30 min  PT General Charges  $$ ACUTE PT VISIT 1 Visit  PT Treatments  $Gait Training 8-22 mins  $Therapeutic Exercise 8-22 mins   Erline Levine, PTA Acute Rehabilitation Services Pager: [phone] Office: [phone]

## 2018-11-10 DIAGNOSIS — S2239XA Fracture of one rib, unspecified side, initial encounter for closed fracture: Secondary | ICD-10-CM | POA: Diagnosis present

## 2018-11-10 DIAGNOSIS — N390 Urinary tract infection, site not specified: Secondary | ICD-10-CM | POA: Diagnosis not present

## 2018-11-10 DIAGNOSIS — S81812A Laceration without foreign body, left lower leg, initial encounter: Secondary | ICD-10-CM | POA: Diagnosis not present

## 2018-11-10 DIAGNOSIS — S2242XA Multiple fractures of ribs, left side, initial encounter for closed fracture: Secondary | ICD-10-CM | POA: Diagnosis not present

## 2018-11-10 DIAGNOSIS — W010XXA Fall on same level from slipping, tripping and stumbling without subsequent striking against object, initial encounter: Secondary | ICD-10-CM | POA: Diagnosis not present

## 2018-11-10 HISTORY — DX: Fracture of one rib, unspecified side, initial encounter for closed fracture: S22.39XA

## 2018-11-10 NOTE — Progress Notes (Signed)
Occupational Therapy Treatment Patient Details Name: KRYSTALLE PILKINGTON MRN: [id number] DOB: 1931/10/15 Today's Date: 11/10/2018    History of present illness Pt is an 84 y/o female with a past medical history of Fatigue, Malaise, Myalgia, PAC (premature atrial contraction), Palpitations, Paroxysmal atrial flutter, and PVC's (premature ventricular contractions). Pt sustained a fall at home and was wedged between tub and toilet. Pt became faint and dizzy with initial attempts to ambulate in ED, admitted for further workup. Imaging revealed multiple left rib fractures of the lower ribs.  No pneumothorax.    OT comments  Pt progressing towards acute OT goals. Focus of session was bed mobility, pericare in sit<>stand, and simulated toilet transfer. D/c plan remains appropriate.     Follow Up Recommendations  SNF;Supervision/Assistance - 24 hour    Equipment Recommendations  None recommended by OT    Recommendations for Other Services      Precautions / Restrictions Precautions Precautions: Fall Precaution Comments: L rib fxs Restrictions Weight Bearing Restrictions: No       Mobility Bed Mobility Overal bed mobility: Needs Assistance Bed Mobility: Supine to Sit     Supine to sit: Min assist;HOB elevated     General bed mobility comments: assistance required to bring Rhip to EOB; use of rail and HOB elevated  Transfers Overall transfer level: Needs assistance Equipment used: Rolling walker (2 wheeled) Transfers: Sit to/from Stand Sit to Stand: Min assist         General transfer comment: cues for safe hand placement; assist to steady when standing and sitting; poor eccentric loading     Balance Overall balance assessment: Needs assistance Sitting-balance support: Feet supported Sitting balance-Leahy Scale: Good     Standing balance support: Bilateral upper extremity supported;During functional activity Standing balance-Leahy Scale: Poor                             ADL either performed or assessed with clinical judgement   ADL Overall ADL's : Needs assistance/impaired                             Toileting- Clothing Manipulation and Hygiene: Sit to/from stand;Maximal assistance Toileting - Clothing Manipulation Details (indicate cue type and reason): pt stood with rw and steadying assist, therapist completed pericare     Functional mobility during ADLs: Min guard;Minimal assistance;Rolling walker General ADL Comments: Pt completed bed mobility then found to be incontenient of bowel. Pericare completed in sit<>stand with max A and 2 seated rest breaks. Once pericare completed pt sat EOB about a minute to rest then completed pivotol steps to sit up in recliner.     Vision       Perception     Praxis      Cognition Arousal/Alertness: Awake/alert Behavior During Therapy: WFL for tasks assessed/performed Overall Cognitive Status: Within Functional Limits for tasks assessed                                 General Comments: HOH        Exercises     Shoulder Instructions       General Comments      Pertinent Vitals/ Pain       Pain Assessment: Faces Faces Pain Scale: Hurts even more Pain Location: abdomen and R hip/groin Pain Descriptors / Indicators: Discomfort;Sore;Guarding Pain Intervention(s): Limited activity  within patient's tolerance;Monitored during session;Repositioned  Home Living                                          Prior Functioning/Environment              Frequency  Min 2X/week        Progress Toward Goals  OT Goals(current goals can now be found in the care plan section)  Progress towards OT goals: Progressing toward goals  Acute Rehab OT Goals Patient Stated Goal: to go home to her dog OT Goal Formulation: With patient Time For Goal Achievement: 11/20/18 Potential to Achieve Goals: Good ADL Goals Pt Will Perform Grooming: with modified  independence;standing Pt Will Perform Lower Body Bathing: with modified independence;sit to/from stand Pt Will Perform Upper Body Dressing: with modified independence;sitting Pt Will Perform Lower Body Dressing: with modified independence;sit to/from stand Pt Will Transfer to Toilet: with modified independence;ambulating;regular height toilet Pt Will Perform Toileting - Clothing Manipulation and hygiene: with modified independence;sit to/from stand Additional ADL Goal #1: Pt will perform bed mobility at mod independent level as precursor to EOB/OOB ADL.  Plan Discharge plan remains appropriate    Co-evaluation                 AM-PAC OT "6 Clicks" Daily Activity     Outcome Measure   Help from another person eating meals?: None Help from another person taking care of personal grooming?: A Little Help from another person toileting, which includes using toliet, bedpan, or urinal?: A Lot Help from another person bathing (including washing, rinsing, drying)?: A Little Help from another person to put on and taking off regular upper body clothing?: None Help from another person to put on and taking off regular lower body clothing?: A Little 6 Click Score: 19    End of Session Equipment Utilized During Treatment: Rolling walker  OT Visit Diagnosis: Unsteadiness on feet (R26.81);Pain   Activity Tolerance Patient tolerated treatment well;Patient limited by pain;Patient limited by fatigue   Patient Left in chair;with call bell/phone within reach   Nurse Communication Other (comment);Mobility status(bowel incontenince)        Time: [id number] OT Time Calculation (min): 24 min  Charges: OT General Charges $OT Visit: 1 Visit OT Treatments $Self Care/Home Management : 23-37 mins  Raynald KempKathryn Keontae Levingston, OT Acute Rehabilitation Services Pager: [phone] Office: [phone]    Pilar GrammesMathews, Talley Kreiser H 11/10/2018, 1:49 PM

## 2018-11-10 NOTE — Progress Notes (Signed)
PROGRESS NOTE  [name] Weber:[id number] DOB: 02-12-1931 DOA: 11/05/2018 PCP: Marden Noble, MD  HPI/Brief Narrative  [name] Weber is a 84 y.o. year old female with medical history significant for fatigue, malaise, paroxysmal atrial fibrillation (not on anticoagulation) occasional palpitations osteoporosis, anxiety disorder, pulmonary hypertension, and remote pelvic fracture  who presented on 11/05/2018 to the ED after being found wedged between the toilet and her tub.  Upon discussion she states somehow she slid off the toilet denied any preceding signs or symptoms.  She became faint and dizzy upon initial attempt to ambulate in the ED prompting admission for further work-up.  She was found to have abnormal urinalysis with E. coli on urine culture, she does not remember any urinary symptoms but here has had urinary frequency concerning for UTI as well as multiple rib fractures on bilateral lower extremity skin tears in the setting of fall.    Subjective Complaining of bilateral hip pain Assessment/Plan:  #Minimally displaced lower left rib fractures, stable.  Focusing on pain control, seems to be doing well with lidocaine patch (started 12/10), PRN tramadol.  Supportive care with incentive spirometry  #Bilateral hip pain. Still persistent pain despite pretty good regimen. XR on admission were negative obtain CT pelvis/hip to ensure no fracture  #Unwitnessed fall.  Unclear etiology, weird setting of potential mechanical fall.  ED recommends SNF for continued therapy with mobility given balance deficits.  Check orthostatics (has remained positive last evaluation on 12/11)  #Hypertension, not at goal.  Not on any blood pressure medications at home.  SBP 160s, may have some effect due to pain but will start amlodipine 5 mg since she has had a peak SBP of 179  #E. coli UTI, continue Keflex regimen.  Patient unable to report any previous urinary symptoms prior to hospital stay but does  report urinary frequency while here  #Bilateral lower extremity skin tears, stable.  Dressings in place.  Continue to monitor.  #Paroxysmal atrial flutter, rate controlled.  Continue home digoxin.  Not on any anticoagulation as outpatient.  Code Status: Full code  Family Communication: No family at bedside discussed with her family friend [name] Weber by phone on 12/12 at [phone]  Disposition Plan: Better BP control, resolution of orthostatics especially since patient will be going to skilled nursing facility which will require further physical therapy.  Will downgrade to observation status given no positive orthostatics today (however none obtained)     Antimicrobials: Anti-infectives (From admission, onward)   Start     Dose/Rate Route Frequency Ordered Stop   11/06/18 2200  cephALEXin (KEFLEX) capsule 500 mg     500 mg Oral Every 12 hours 11/06/18 1224     11/06/18 0600  cephALEXin (KEFLEX) capsule 500 mg  Status:  Discontinued     500 mg Oral Every 8 hours 11/05/18 1140 11/06/18 1224   11/05/18 1015  cefTRIAXone (ROCEPHIN) 1 g in sodium chloride 0.9 % 100 mL IVPB     1 g 200 mL/hr over 30 Minutes Intravenous  Once 11/05/18 1011 11/05/18 1054         DVT prophylaxis: lovenox   Objective: Vitals:   11/09/18 1900 11/09/18 2019 11/10/18 0412 11/10/18 1500  BP:  (!) 117/56 (!) 158/78 (!) 142/59  Pulse:  76 60 65  Resp:  15 18 20   Temp:  98.2 F (36.8 C) 98.5 F (36.9 C) 98 F (36.7 C)  TempSrc:  Oral Oral Oral  SpO2:  98% 96% 98%  Weight: 54.6  kg     Height:        Intake/Output Summary (Last 24 hours) at 11/10/2018 1621 Last data filed at 11/10/2018 1100 Gross per 24 hour  Intake 600 ml  Output 1301 ml  Net -701 ml   Filed Weights   11/09/18 1900  Weight: 54.6 kg    Exam:  Constitutional: Elderly female, no distress Eyes: EOMI, anicteric, normal conjunctivae ENMT: Oropharynx with moist mucous membranes, normal dentition Cardiovascular: RRR no MRGs,  with no peripheral edema Respiratory: Normal respiratory effort, clear breath sounds  Abdomen: Soft,non-tender, with no HSM Skin: Bruising along lateral side of left rib cage, tenderness to palpation of left rib cage  Neurologic: Grossly no focal neuro deficit.  Decreased strength in bilateral legs proximally most likely due to pain Psychiatric:Appropriate affect, and mood. Mental status AAOx3  Data Reviewed: CBC: Recent Labs  Lab 11/05/18 0748 11/05/18 1229 11/07/18 0311 11/09/18 0229  WBC 12.4* 9.8 7.8 5.8  NEUTROABS 10.8*  --   --   --   HGB 15.3* 14.0 13.4 12.7  HCT 48.6* 45.0 41.7 40.7  MCV 100.8* 101.8* 99.3 100.5*  PLT 168 166 141* 165   Basic Metabolic Panel: Recent Labs  Lab 11/05/18 0748 11/05/18 1229 11/07/18 0311 11/09/18 0229  NA 142  --  141 141  K 4.2  --  3.9 4.0  CL 101  --  108 105  CO2 24  --  25 28  GLUCOSE 117*  --  98 94  BUN 17  --  10 20  CREATININE 0.88 0.72 0.65 0.77  CALCIUM 9.4  --  8.5* 8.8*   GFR: Estimated Creatinine Clearance: 44.3 mL/min (by C-G formula based on SCr of 0.77 mg/dL). Liver Function Tests: No results for input(s): AST, ALT, ALKPHOS, BILITOT, PROT, ALBUMIN in the last 168 hours. No results for input(s): LIPASE, AMYLASE in the last 168 hours. No results for input(s): AMMONIA in the last 168 hours. Coagulation Profile: No results for input(s): INR, PROTIME in the last 168 hours. Cardiac Enzymes: No results for input(s): CKTOTAL, CKMB, CKMBINDEX, TROPONINI in the last 168 hours. BNP (last 3 results) No results for input(s): PROBNP in the last 8760 hours. HbA1C: No results for input(s): HGBA1C in the last 72 hours. CBG: No results for input(s): GLUCAP in the last 168 hours. Lipid Profile: No results for input(s): CHOL, HDL, LDLCALC, TRIG, CHOLHDL, LDLDIRECT in the last 72 hours. Thyroid Function Tests: No results for input(s): TSH, T4TOTAL, FREET4, T3FREE, THYROIDAB in the last 72 hours. Anemia Panel: No results for  input(s): VITAMINB12, FOLATE, FERRITIN, TIBC, IRON, RETICCTPCT in the last 72 hours. Urine analysis:    Component Value Date/Time   COLORURINE YELLOW 11/05/2018 0939   APPEARANCEUR CLOUDY (A) 11/05/2018 0939   LABSPEC 1.018 11/05/2018 0939   PHURINE 5.0 11/05/2018 0939   GLUCOSEU NEGATIVE 11/05/2018 0939   HGBUR MODERATE (A) 11/05/2018 0939   BILIRUBINUR NEGATIVE 11/05/2018 0939   KETONESUR 5 (A) 11/05/2018 0939   PROTEINUR 30 (A) 11/05/2018 0939   UROBILINOGEN 0.2 01/23/2015 0959   NITRITE POSITIVE (A) 11/05/2018 0939   LEUKOCYTESUR TRACE (A) 11/05/2018 0939   Sepsis Labs: @LABRCNTIP (procalcitonin:4,lacticidven:4)  ) Recent Results (from the past 240 hour(s))  Urine Culture     Status: Abnormal   Collection Time: 11/05/18 10:50 AM  Result Value Ref Range Status   Specimen Description URINE, RANDOM  Final   Special Requests   Final    NONE Performed at [facility], [address] [place], [place] [address]    Culture >=100,000 COLONIES/mL ESCHERICHIA COLI (A)  Final   Report Status 11/07/2018 FINAL  Final   Organism ID, Bacteria ESCHERICHIA COLI (A)  Final      Susceptibility   Escherichia coli - MIC*    AMPICILLIN >=32 RESISTANT Resistant     CEFAZOLIN <=4 SENSITIVE Sensitive     CEFTRIAXONE <=1 SENSITIVE Sensitive     CIPROFLOXACIN <=0.25 SENSITIVE Sensitive     GENTAMICIN <=1 SENSITIVE Sensitive     IMIPENEM <=0.25 SENSITIVE Sensitive     NITROFURANTOIN <=16 SENSITIVE Sensitive     TRIMETH/SULFA <=20 SENSITIVE Sensitive     AMPICILLIN/SULBACTAM 16 INTERMEDIATE Intermediate     PIP/TAZO <=4 SENSITIVE Sensitive     Extended ESBL NEGATIVE Sensitive     * >=100,000 COLONIES/mL ESCHERICHIA COLI      Studies: No results found.  Scheduled Meds: . acetaminophen  1,000 mg Oral TID  . acidophilus  1 capsule Oral Daily  . amLODipine  5 mg Oral Daily  . aspirin EC  81 mg Oral Daily  . cephALEXin  500 mg Oral Q12H  . digoxin  125 mcg Oral Daily  . docusate  sodium  100 mg Oral Daily  . enoxaparin (LOVENOX) injection  40 mg Subcutaneous Q24H  . latanoprost  1 drop Both Eyes QHS  . lidocaine  1 patch Transdermal Q24H  . vitamin B-12  500 mcg Oral Daily    Continuous Infusions:   LOS: 2 days     Laverna PeaceShayla D Candi Profit, MD [facility] Pager [phone]  If 7PM-7AM, please contact night-coverage [email or web address] Password [email or web address] 11/10/2018, 4:21 PM

## 2018-11-11 ENCOUNTER — Observation Stay (HOSPITAL_COMMUNITY): Payer: Medicare Other

## 2018-11-11 DIAGNOSIS — E440 Moderate protein-calorie malnutrition: Secondary | ICD-10-CM | POA: Diagnosis present

## 2018-11-11 DIAGNOSIS — M25551 Pain in right hip: Secondary | ICD-10-CM | POA: Diagnosis not present

## 2018-11-11 DIAGNOSIS — M47816 Spondylosis without myelopathy or radiculopathy, lumbar region: Secondary | ICD-10-CM | POA: Diagnosis present

## 2018-11-11 DIAGNOSIS — Z7982 Long term (current) use of aspirin: Secondary | ICD-10-CM | POA: Diagnosis not present

## 2018-11-11 DIAGNOSIS — E538 Deficiency of other specified B group vitamins: Secondary | ICD-10-CM | POA: Diagnosis present

## 2018-11-11 DIAGNOSIS — M8000XA Age-related osteoporosis with current pathological fracture, unspecified site, initial encounter for fracture: Secondary | ICD-10-CM | POA: Diagnosis present

## 2018-11-11 DIAGNOSIS — S2242XA Multiple fractures of ribs, left side, initial encounter for closed fracture: Secondary | ICD-10-CM | POA: Diagnosis present

## 2018-11-11 DIAGNOSIS — M25552 Pain in left hip: Secondary | ICD-10-CM

## 2018-11-11 DIAGNOSIS — G8929 Other chronic pain: Secondary | ICD-10-CM | POA: Diagnosis present

## 2018-11-11 DIAGNOSIS — I370 Nonrheumatic pulmonary valve stenosis: Secondary | ICD-10-CM | POA: Diagnosis not present

## 2018-11-11 DIAGNOSIS — Z23 Encounter for immunization: Secondary | ICD-10-CM | POA: Diagnosis not present

## 2018-11-11 DIAGNOSIS — Y92002 Bathroom of unspecified non-institutional (private) residence single-family (private) house as the place of occurrence of the external cause: Secondary | ICD-10-CM | POA: Diagnosis not present

## 2018-11-11 DIAGNOSIS — S81812A Laceration without foreign body, left lower leg, initial encounter: Secondary | ICD-10-CM | POA: Diagnosis present

## 2018-11-11 DIAGNOSIS — Z888 Allergy status to other drugs, medicaments and biological substances status: Secondary | ICD-10-CM | POA: Diagnosis not present

## 2018-11-11 DIAGNOSIS — I10 Essential (primary) hypertension: Secondary | ICD-10-CM | POA: Diagnosis present

## 2018-11-11 DIAGNOSIS — I951 Orthostatic hypotension: Secondary | ICD-10-CM | POA: Diagnosis present

## 2018-11-11 DIAGNOSIS — R1031 Right lower quadrant pain: Secondary | ICD-10-CM | POA: Diagnosis not present

## 2018-11-11 DIAGNOSIS — B9620 Unspecified Escherichia coli [E. coli] as the cause of diseases classified elsewhere: Secondary | ICD-10-CM | POA: Diagnosis present

## 2018-11-11 DIAGNOSIS — I119 Hypertensive heart disease without heart failure: Secondary | ICD-10-CM | POA: Diagnosis not present

## 2018-11-11 DIAGNOSIS — N390 Urinary tract infection, site not specified: Secondary | ICD-10-CM | POA: Diagnosis not present

## 2018-11-11 DIAGNOSIS — I4891 Unspecified atrial fibrillation: Secondary | ICD-10-CM

## 2018-11-11 DIAGNOSIS — K629 Disease of anus and rectum, unspecified: Secondary | ICD-10-CM | POA: Clinically undetermined

## 2018-11-11 DIAGNOSIS — Z87891 Personal history of nicotine dependence: Secondary | ICD-10-CM | POA: Diagnosis not present

## 2018-11-11 DIAGNOSIS — S81811A Laceration without foreign body, right lower leg, initial encounter: Secondary | ICD-10-CM | POA: Diagnosis present

## 2018-11-11 DIAGNOSIS — I272 Pulmonary hypertension, unspecified: Secondary | ICD-10-CM | POA: Diagnosis present

## 2018-11-11 DIAGNOSIS — W010XXA Fall on same level from slipping, tripping and stumbling without subsequent striking against object, initial encounter: Secondary | ICD-10-CM | POA: Diagnosis not present

## 2018-11-11 DIAGNOSIS — R102 Pelvic and perineal pain: Secondary | ICD-10-CM | POA: Diagnosis not present

## 2018-11-11 DIAGNOSIS — J90 Pleural effusion, not elsewhere classified: Secondary | ICD-10-CM | POA: Diagnosis present

## 2018-11-11 DIAGNOSIS — K5909 Other constipation: Secondary | ICD-10-CM | POA: Diagnosis present

## 2018-11-11 DIAGNOSIS — Z681 Body mass index (BMI) 19 or less, adult: Secondary | ICD-10-CM | POA: Diagnosis not present

## 2018-11-11 DIAGNOSIS — W1811XA Fall from or off toilet without subsequent striking against object, initial encounter: Secondary | ICD-10-CM | POA: Diagnosis not present

## 2018-11-11 DIAGNOSIS — N3090 Cystitis, unspecified without hematuria: Secondary | ICD-10-CM | POA: Diagnosis present

## 2018-11-11 DIAGNOSIS — Z79899 Other long term (current) drug therapy: Secondary | ICD-10-CM | POA: Diagnosis not present

## 2018-11-11 DIAGNOSIS — G629 Polyneuropathy, unspecified: Secondary | ICD-10-CM | POA: Diagnosis present

## 2018-11-11 DIAGNOSIS — I517 Cardiomegaly: Secondary | ICD-10-CM | POA: Diagnosis not present

## 2018-11-11 DIAGNOSIS — E785 Hyperlipidemia, unspecified: Secondary | ICD-10-CM | POA: Diagnosis present

## 2018-11-11 DIAGNOSIS — I480 Paroxysmal atrial fibrillation: Secondary | ICD-10-CM | POA: Diagnosis present

## 2018-11-11 HISTORY — DX: Disease of anus and rectum, unspecified: K62.9

## 2018-11-11 HISTORY — DX: Orthostatic hypotension: I95.1

## 2018-11-11 MED ORDER — AMLODIPINE BESYLATE 5 MG PO TABS: 5.0000 mg | ORAL_TABLET | Freq: Every day | ORAL | 0 refills | Status: DC

## 2018-11-11 MED ORDER — ASPIRIN 81 MG PO TBEC: 81.0000 mg | DELAYED_RELEASE_TABLET | Freq: Every day | ORAL | 12 refills | Status: DC

## 2018-11-11 MED ORDER — LIDOCAINE 5 % EX PTCH: 1.0000 | MEDICATED_PATCH | CUTANEOUS | 0 refills | Status: DC

## 2018-11-11 MED ORDER — ACETAMINOPHEN 500 MG PO TABS: 1000.0000 mg | ORAL_TABLET | Freq: Three times a day (TID) | ORAL | 0 refills | Status: AC

## 2018-11-11 MED ORDER — HYDRALAZINE HCL 20 MG/ML IJ SOLN: 5.0000 mg | Freq: Four times a day (QID) | INTRAMUSCULAR | Status: DC | PRN

## 2018-11-11 MED ORDER — POLYETHYLENE GLYCOL 3350 17 G PO PACK: 17.0000 g | PACK | Freq: Every day | ORAL | 0 refills | Status: DC | PRN

## 2018-11-11 MED ORDER — PROBIOTIC ACIDOPHILUS BEADS PO CAPS: 1.0000 | ORAL_CAPSULE | Freq: Every day | ORAL | Status: DC

## 2018-11-11 MED ORDER — LUMIGAN 0.01 % OP SOLN: 1.0000 [drp] | Freq: Every day | OPHTHALMIC | Status: DC

## 2018-11-11 MED ORDER — CYANOCOBALAMIN 500 MCG PO TABS: 500.0000 ug | ORAL_TABLET | Freq: Every day | ORAL | 0 refills | Status: DC

## 2018-11-11 MED ORDER — DOCUSATE SODIUM 100 MG PO CAPS: 100.0000 mg | ORAL_CAPSULE | Freq: Every day | ORAL | 0 refills | Status: DC

## 2018-11-11 MED ORDER — SODIUM CHLORIDE 0.9 % IV SOLN
INTRAVENOUS | Status: AC
  Administered 2018-11-11: 14:00:00 via INTRAVENOUS

## 2018-11-11 MED ORDER — DIGOXIN 125 MCG PO TABS: 125.0000 ug | ORAL_TABLET | Freq: Every day | ORAL | 3 refills | Status: DC

## 2018-11-11 MED ORDER — TRAMADOL HCL 50 MG PO TABS: 50.0000 mg | ORAL_TABLET | Freq: Four times a day (QID) | ORAL | 0 refills | Status: DC | PRN

## 2018-11-11 NOTE — Progress Notes (Signed)
Pt family member was upset about pt being discharged to SNF today stated that her BP is not stable and that her skin tears were open. Pt family members wanted to speak to MD, RN paged MD awaiting call back. Family wants dressing changed pt and family notified that there are no active dressing change orders but I would change the mepilex and clean the dressing. Darl PikesSusan CM and Jasmine DecemberSharon Go, Director at bedside when family was voicing concerns.

## 2018-11-11 NOTE — Progress Notes (Signed)
Patient will DC to: [facility] Anticipated DC date: 11/11/18 Family notified: Guinevere FerrariViola Transport by: Sharin MonsPTAR  Per MD [facility] RN, patient, patient's family, and facility notified of DC. Discharge Summary sent to facility. RN given number for report [phone]. DC packet on chart. Ambulance transport requested for patient.  CSW signing off.  LimaAshley Jared Cahn, [place] [phone]

## 2018-11-11 NOTE — Progress Notes (Signed)
MD called and is speaking to Pt POA

## 2018-11-11 NOTE — Plan of Care (Signed)
  Problem: Safety: Goal: Ability to remain free from injury will improve Outcome: Progressing   

## 2018-11-11 NOTE — Care Management CC44 (Signed)
Condition Code 44 Documentation Completed  Patient Details  Name: Stephani Policevelyn C Jelinski MRN: [id number] Date of Birth: 05/26/1931   Condition Code 44 given:  Yes Patient signature on Condition Code 44 notice:  Yes Documentation of 2 MD's agreement:  Yes Code 44 added to claim:  Yes    Durenda GuthrieBrady, Kathy Wahid Naomi, RN 11/11/2018, 9:58 AM

## 2018-11-11 NOTE — Discharge Summary (Addendum)
Discharge Summary  [name] Weber UJW:[id number] DOB: Mar 19, 1931  PCP: Marden Noble, MD  Admit date: 11/05/2018 Discharge date: 11/11/2018   Time spent: < 25 minutes  Admitted From: Home  Disposition:  SNF  Recommendations for Outpatient Follow-up:  1. Follow up with PCP in 1-2 weeks 2. Tylenol scheduled 3 x times daily for 10 days for rib fracture 3. Printed script for tramadol PRN use for breakthrough pain 4. Printed script for lidocaine patch for rib fracture pain 5. Continue B12, new medication given low normal (193)B12 levels.  6. Amlodipine 5 mg, BP check and titration as needed by PCP  7. Compression stockings for orthostatic hypotension    Discharge Diagnoses:  Active Hospital Problems   Diagnosis Date Noted  . Multiple rib fractures 11/05/2018  . Rib fracture 11/10/2018  . B12 deficiency 11/08/2018  . Cystitis 11/05/2018  . Chronic midline low back pain without sciatica   . Osteoporosis with current pathological fracture   . A-fib (HCC)   . Chronic constipation   . Near syncope 11/21/2014  . Weakness of both legs   . Benign hypertensive heart disease without heart failure 03/10/2011  . HLD (hyperlipidemia) 03/10/2011    Resolved Hospital Problems   Diagnosis Date Noted Date Resolved  . Bacterial UTI 11/30/2014 11/11/2018  . Orthostatic hypotension 11/22/2014 11/11/2018    Discharge Condition: STABLE   CODE STATUS:FULL   Diet recommendation:     History of present illness:   [name] Weber is a 84 y.o. year old female with medical history significant for fatigue, malaise, paroxysmal atrial fibrillation (not on anticoagulation) occasional palpitations osteoporosis, anxiety disorder, pulmonary hypertension, and remote pelvic fracture(11/2016)  who presented on 11/05/2018 to the ED after being found wedged between the toilet and her tub.  Upon discussion she states somehow she slid off the toilet denied any preceding signs or symptoms.  She became faint  and dizzy upon initial attempt to ambulate in the ED prompting admission for further work-up.  Remaining hospital course addressed in problem based format below:   Hospital Course:   Minimally displaced lower left Rib Fractures,stable. Demonstrated on dedicated XR. On scheduled tylenol for pain control to continue for 10 days. Patient has some cognitive/memory deficits so will need much assistance in getting her PRN tramadol as she often forgets speaking to providers or does not voice her pain. This will be critical to help with the therapy/rehab she needs while at her SNF  Unwitnessed Fall. Unclear if mechanical since reportedly happened while sliding off bed. Workup only significant for B12 low normal, orthostatic hypotension, and UTI which likely are both contributing factors. Also was found to be orthostatic but that resolved with IVF and supportive care. Underwent significant imaging including CT pelvis/hips given she complained of persistent hip pain but that was negative for any fractures no 12/13. Like above pain control will be essential as patient does not volunteer or specifically ask for PRNs and must be prompted on assessments.   E.coli UTI. Completed 5 day course of kefelx. No longer with urinary frequency or other symptoms  B12 deficiency. B12 at lower end of normal ( 193) in setting of fall with no clear etiology some concern for beginning of symptoms. So started supplementation in hospital and continue on discharge to her SNF  HTN, Has history but not previously on bp meds SBP elevated in 170s before initiation of amlodipine 5 mg with significant improvement with SBP to 140s prior to discharge. Will need monitoring and up-titration as  needed. Closely monitor given admitted with orthostatic hypotension.   Bilateral full thickness skin tears. Evaluated by wound care nurse on admission, no signs of infection with no odor or fluctuance. Continue Xeroform guaze to minimize adherence and  discomfort , foam dressing to protect from further injury.   Orthostatic hypotension. Was initially positive on hospital stay but normalized with supportive care. ON day of discharge vitals were taken. Lying BP 155/96, initially with sitting BP of 136/117 but with Standing BP initially 149/129 and stayed there with no drop to 156/133. Her HR did begin to drop to 55 ( from 102 ) at the end of the three minute period. Advise compression stockings to assist. May need to discontinue amlodipine if this persists with physical therapy.   History of atrial fibrillation. Remained rate controlled during stay.   Consultations:  none  Procedures/Studies: none  Discharge Exam: BP (!) 149/85 (BP Location: Left Arm)   Pulse (!) 55   Temp 97.9 F (36.6 C) (Oral)   Resp 18   Ht 5\' 8"  (1.727 m)   Wt 54.6 kg   SpO2 98%   BMI 18.31 kg/m   General: Lying in bed, no apparent distress Eyes: EOMI, anicteric, very hard of hearing ENT: Oral Mucosa clear and moist Cardiovascular: irregularly irregular rhythm, normal rate, no murmurs, rubs or gallops, no edema, Respiratory: Normal respiratory effort, lungs clear to auscultation bilaterally Abdomen: soft, non-distended, non-tender, normal bowel sounds MSK: able to move bilateral lower extremities but limited due to pain.  Skin: No Rash Neurologic: Grossly no focal neuro deficit. Alert to person, place. Very forgetfulPsychiatric:Appropriate affect, and mood   Discharge Instructions You were cared for by a hospitalist during your hospital stay. If you have any questions about your discharge medications or the care you received while you were in the hospital after you are discharged, you can call the unit and asked to speak with the hospitalist on call if the hospitalist that took care of you is not available. Once you are discharged, your primary care physician will handle any further medical issues. Please note that NO REFILLS for any discharge medications  will be authorized once you are discharged, as it is imperative that you return to your primary care physician (or establish a relationship with a primary care physician if you do not have one) for your aftercare needs so that they can reassess your need for medications and monitor your lab values.  Discharge Instructions    Compression stockings   Complete by:  As directed    Diet - low sodium heart healthy   Complete by:  As directed    Increase activity slowly   Complete by:  As directed      Allergies as of 11/11/2018      Reactions   Crestor [rosuvastatin Calcium] Other (See Comments)   "bones ache all over"   Rosuvastatin Other (See Comments)   Bone pain all over body      Medication List    TAKE these medications   acetaminophen 500 MG tablet Commonly known as:  TYLENOL Take 2 tablets (1,000 mg total) by mouth 3 (three) times daily for 10 days. What changed:    how much to take  when to take this  reasons to take this   amLODipine 5 MG tablet Commonly known as:  NORVASC Take 1 tablet (5 mg total) by mouth daily. Start taking on:  November 12, 2018   aspirin 81 MG EC tablet Take 1 tablet (81  mg total) by mouth daily.   CALCIUM 600 + D PO Take 1 tablet by mouth daily.   digoxin 0.125 MG tablet Commonly known as:  LANOXIN Take 1 tablet (125 mcg total) by mouth daily.   docusate sodium 100 MG capsule Commonly known as:  COLACE Take 1 capsule (100 mg total) by mouth daily. Start taking on:  November 12, 2018   lidocaine 5 % Commonly known as:  LIDODERM Place 1 patch onto the skin daily. Remove & Discard patch within 12 hours or as directed by MD Start taking on:  November 12, 2018   LUMIGAN 0.01 % Soln Generic drug:  bimatoprost Place 1 drop into both eyes at bedtime.   polyethylene glycol packet Commonly known as:  MIRALAX / GLYCOLAX Take 17 g by mouth daily as needed for mild constipation.   PROBIOTIC ACIDOPHILUS BEADS Caps Take 1 capsule by  mouth daily.   traMADol 50 MG tablet Commonly known as:  ULTRAM Take 1 tablet (50 mg total) by mouth every 6 (six) hours as needed for moderate pain.   vitamin B-12 500 MCG tablet Commonly known as:  CYANOCOBALAMIN Take 1 tablet (500 mcg total) by mouth daily. Start taking on:  November 12, 2018      Allergies  Allergen Reactions  . Crestor [Rosuvastatin Calcium] Other (See Comments)    "bones ache all over"  . Rosuvastatin Other (See Comments)     Bone pain all over body   Follow-up Information    Marden NobleGates, Robert, MD.   Specialty:  Internal Medicine Contact information: [address] [place] [address] [phone]            The results of significant diagnostics from this hospitalization (including imaging, microbiology, ancillary and laboratory) are listed below for reference.    Significant Diagnostic Studies: Dg Ribs Unilateral W/chest Left  Result Date: 11/05/2018 CLINICAL DATA:  Pain after fall. EXAM: LEFT RIBS AND CHEST - 3+ VIEW COMPARISON:  Chest radiograph 12/26/2016 FINDINGS: Frontal view the chest and four views of left-sided ribs. Frontal view of the chest demonstrates midline trachea. Mild cardiomegaly. Atherosclerosis in the transverse aorta. No pleural effusion or pneumothorax. Left rib radiographs demonstrate subtle osseous irregularity involving anterior aspect of lower left ribs, most apparent on the final image. IMPRESSION: Suspect minimally displaced fractures of anterior lower left ribs. Correlate with point tenderness. No evidence of pleural fluid or pneumothorax. Cardiomegaly without congestive failure. Aortic Atherosclerosis (ICD10-I70.0). Electronically Signed   By: Jeronimo GreavesKyle  Talbot M.D.   On: 11/05/2018 10:32   Dg Lumbar Spine 2-3 Views  Result Date: 11/06/2018 CLINICAL DATA:  Low back pain. Multiple recent falls. EXAM: LUMBAR SPINE - 2-3 VIEW COMPARISON:  08/10/2016. FINDINGS: Non-rib-bearing lumbar vertebrae. Stable T12 through  L2 laminectomy defects. Stable 25% L2 superior endplate compression deformity with no significant bony retropulsion and no acute fracture lines. Stable facet degenerative changes in the mid and lower lumbar spine with associated stable grade 1 anterolisthesis at the L5-S1 level. No pars defects or acute fractures. And inferior vena cava filter is again demonstrated with its tip at the inferior L2 level. IMPRESSION: No acute abnormality. Stable degenerative and postoperative changes. Electronically Signed   By: Beckie SaltsSteven  Reid M.D.   On: 11/06/2018 18:54   Dg Pelvis 1-2 Views  Result Date: 11/05/2018 CLINICAL DATA:  84 year old female with fall. EXAM: PELVIS - 1-2 VIEW COMPARISON:  Pelvic radiograph dated 12/30/2016 FINDINGS: There is no acute fracture or dislocation. Old right pubic rami fractures.  The bones are osteopenic. The soft tissues are unremarkable. Partially visualized IVC filter. IMPRESSION: 1. No acute fracture or dislocation. 2. Old right pubic rami fractures. Electronically Signed   By: Elgie Collard M.D.   On: 11/05/2018 06:31   Dg Tibia/fibula Left  Result Date: 11/05/2018 CLINICAL DATA:  Initial evaluation for acute trauma, fall. EXAM: LEFT TIBIA AND FIBULA - 2 VIEW COMPARISON:  None. FINDINGS: No acute fracture or dislocation. Diffuse osteopenia noted. Mild degenerative changes noted about the knee and ankle. No acute soft tissue abnormality. Vascular calcifications noted. IMPRESSION: No acute osseous abnormality about the left tibia/fibula. Electronically Signed   By: Rise Mu M.D.   On: 11/05/2018 06:32   Ct Pelvis Wo Contrast  Result Date: 11/11/2018 CLINICAL DATA:  Pain after fall. EXAM: CT PELVIS WITHOUT CONTRAST TECHNIQUE: Multidetector CT imaging of the pelvis was performed following the standard protocol without intravenous contrast. COMPARISON:  X-ray November 05, 2018 FINDINGS: Urinary Tract:  No abnormality visualized. Bowel: Colonic diverticulosis without  diverticulitis. Apparent rectal wall thickening with mild increased attenuation in the surrounding fat. Vascular/Lymphatic: Atherosclerotic change in the aorta and branching vessels. No aneurysm noted. Reproductive:  No mass or other significant abnormality Other:  None. Musculoskeletal: Healed right pubic rami fractures. No hip fractures noted. Degenerative changes in the lower lumbar spine. IMPRESSION: 1. No acute fracture identified. Osteopenia. If concern persists, an MRI would be more sensitive in the evaluation for nondisplaced subtle fractures in the setting of osteopenia. 2. Apparent rectal wall thickening with possible mild increased attenuation in the adjacent fat. This could represent inflammation of the rectum in the appropriate clinical setting. Recommend clinical correlation. Electronically Signed   By: Gerome Sam III M.D   On: 11/11/2018 09:17   Dg Knee Complete 4 Views Right  Result Date: 11/05/2018 CLINICAL DATA:  84 year old female with fall and right knee pain. EXAM: RIGHT KNEE - COMPLETE 4+ VIEW COMPARISON:  None. FINDINGS: There is no acute fracture or dislocation. Osteopenia. No arthritic changes. No joint effusion. The soft tissues appear unremarkable. IMPRESSION: No acute fracture or dislocation. Electronically Signed   By: Elgie Collard M.D.   On: 11/05/2018 06:32    Microbiology: Recent Results (from the past 240 hour(s))  Urine Culture     Status: Abnormal   Collection Time: 11/05/18 10:50 AM  Result Value Ref Range Status   Specimen Description URINE, RANDOM  Final   Special Requests   Final    NONE Performed at [facility], [address] [place], [place] [address]    Culture >=100,000 COLONIES/mL ESCHERICHIA COLI (A)  Final   Report Status 11/07/2018 FINAL  Final   Organism ID, Bacteria ESCHERICHIA COLI (A)  Final      Susceptibility   Escherichia coli - MIC*    AMPICILLIN >=32 RESISTANT Resistant     CEFAZOLIN <=4 SENSITIVE Sensitive      CEFTRIAXONE <=1 SENSITIVE Sensitive     CIPROFLOXACIN <=0.25 SENSITIVE Sensitive     GENTAMICIN <=1 SENSITIVE Sensitive     IMIPENEM <=0.25 SENSITIVE Sensitive     NITROFURANTOIN <=16 SENSITIVE Sensitive     TRIMETH/SULFA <=20 SENSITIVE Sensitive     AMPICILLIN/SULBACTAM 16 INTERMEDIATE Intermediate     PIP/TAZO <=4 SENSITIVE Sensitive     Extended ESBL NEGATIVE Sensitive     * >=100,000 COLONIES/mL ESCHERICHIA COLI     Labs: Basic Metabolic Panel: Recent Labs  Lab 11/05/18 0748 11/05/18 1229 11/07/18 0311 11/09/18 0229  NA 142  --  141 141  K 4.2  --  3.9 4.0  CL 101  --  108 105  CO2 24  --  25 28  GLUCOSE 117*  --  98 94  BUN 17  --  10 20  CREATININE 0.88 0.72 0.65 0.77  CALCIUM 9.4  --  8.5* 8.8*   Liver Function Tests: No results for input(s): AST, ALT, ALKPHOS, BILITOT, PROT, ALBUMIN in the last 168 hours. No results for input(s): LIPASE, AMYLASE in the last 168 hours. No results for input(s): AMMONIA in the last 168 hours. CBC: Recent Labs  Lab 11/05/18 0748 11/05/18 1229 11/07/18 0311 11/09/18 0229  WBC 12.4* 9.8 7.8 5.8  NEUTROABS 10.8*  --   --   --   HGB 15.3* 14.0 13.4 12.7  HCT 48.6* 45.0 41.7 40.7  MCV 100.8* 101.8* 99.3 100.5*  PLT 168 166 141* 165   Cardiac Enzymes: No results for input(s): CKTOTAL, CKMB, CKMBINDEX, TROPONINI in the last 168 hours. BNP: BNP (last 3 results) No results for input(s): BNP in the last 8760 hours.  ProBNP (last 3 results) No results for input(s): PROBNP in the last 8760 hours.  CBG: No results for input(s): GLUCAP in the last 168 hours.     Signed:  Laverna Peace, MD [facility] 11/11/2018, 11:47 AM

## 2018-11-11 NOTE — Plan of Care (Signed)

## 2018-11-11 NOTE — Progress Notes (Signed)
[facility] Nurse Supervisor

## 2018-11-11 NOTE — Clinical Social Work Placement (Signed)
   CLINICAL SOCIAL WORK PLACEMENT  NOTE  Date:  11/11/2018  Patient Details  Name: Stephani Policevelyn C Ellerman MRN: [id number] Date of Birth: 11/27/1931  Clinical Social Work is seeking post-discharge placement for this patient at the Skilled  Nursing Facility level of care (*CSW will initial, date and re-position this form in  chart as items are completed):      Patient/family provided with [facility] Clinical Social Work Department's list of facilities offering this level of care within the geographic area requested by the patient (or if unable, by the patient's family).  Yes   Patient/family informed of their freedom to choose among providers that offer the needed level of care, that participate in Medicare, Medicaid or managed care program needed by the patient, have an available bed and are willing to accept the patient.      Patient/family informed of [facility]'s ownership interest in [facility] and [facility], as well as of the fact that they are under no obligation to receive care at these facilities.  PASRR submitted to EDS on       PASRR number received on 11/07/18     Existing PASRR number confirmed on       FL2 transmitted to all facilities in geographic area requested by pt/family on 11/07/18     FL2 transmitted to all facilities within larger geographic area on       Patient informed that his/her managed care company has contracts with or will negotiate with certain facilities, including the following:        Yes   Patient/family informed of bed offers received.  [facility]     Physician recommends and patient chooses bed at      [facility]18.  Patient to be transferred to facility by [facility]     Patient family notified on 11/11/18 of transfer.  Name of family member notified:  Viola     PHYSICIAN       Additional Comment:    _______________________________________________ Gildardo GriffesAshley M Jeno Calleros,  LCSW 11/11/2018, 12:01 PM

## 2018-11-11 NOTE — Progress Notes (Signed)
RN called and gave report to [facility] no answer will try again.

## 2018-11-11 NOTE — Care Management Important Message (Signed)
Important Message  Patient Details  Name: [name] Weber MRN: [id number] Date of Birth: 12/31/1930   Medicare Important Message Given:       Durenda GuthrieBrady, Tallie Dodds Naomi, RN 11/11/2018, 9:57 AM

## 2018-11-11 NOTE — Progress Notes (Addendum)
PROGRESS NOTE  [name] Weber UJW:[id number] DOB: 04/21/1931 DOA: 11/05/2018 PCP: Marden Noble, MD  HPI/Brief Narrative  [name] Weber is a 84 y.o. year old female with medical history significant for fatigue, malaise, paroxysmal atrial fibrillation (not on anticoagulation) occasional palpitations osteoporosis, anxiety disorder, pulmonary hypertension, and remote pelvic fracture  who presented on 11/05/2018 to the ED after being found wedged between the toilet and her tub.  Upon discussion she states somehow she slid off the toilet denied any preceding signs or symptoms.  She became faint and dizzy upon initial attempt to ambulate in the ED prompting admission for further work-up.  She was found to have abnormal urinalysis with E. coli on urine culture, she does not remember any urinary symptoms but here has had urinary frequency concerning for UTI as well as multiple rib fractures on bilateral lower extremity skin tears in the setting of fall.    Subjective Still having some hip pain but not calling out for PRN Assessment/Plan:  #Minimally displaced lower left rib fractures, stable. S Lidocaine patch (started 12/10), PRN tramadol.  Supportive care with incentive spirometry  #Orthostatic hypotension, unfortunately still positive today and quite symptomatic with dizziness and lightheadedness. 155/96 lying-->136/117 sitting, 156/133 standing.  Will re-initiate IVF, get measurements for compression stockings to assist since she will be ambulatory for therapy. Holding off on pharmacologic therapy with midodrine given she was just started on amlodipine for HTN, if this continues to worsen can discontinue amlodipine. Hold off on discharge and transition to inpatient given above.   #Bilateral hip pain. Still persistent pain despite pretty good regimen unfortunately she is not asking for PRNs which I think is related to her cognitive decline (MCI or dementia?). CT scan of pelvis/hips negative for  acute fracture (XR was the same on admission). Neurologically intact, normal rectal tone (checked 12/13)Continue scheduled tylenol  ADDENDUM#Rectal wall thickening. Mentioned on CT pelvis/hips. She did have episode of fecal incontinence per family today while standing. On exam she has normal rectal tone and otherwise normal rectal exam. No current diarrhea or constipaton, abdominal pain. Will optimize bowel regimen while on opioids and monitor closely  #Unwitnessed fall.  Unclear etiology, weird setting of potential mechanical fall.  ED recommends SNF for continued therapy with mobility given balance deficits.  Likely related to orthostatic hypotension  #Hypertension, not at goal but improved.  Not on any blood pressure medications at home.  SBP 160s early in stay then improved but back up to 150s on orthostatic exam,Started amlodipine 5 mg during hospital stay. Continue to monitor.   #E. coli UTI, completed 5 day course of keflex.   Patient unable to report any previous urinary symptoms prior to hospital stay but does report urinary frequency while here  #Bilateral lower extremity skin tears, stable.  Dressings in place.  Continue to monitor.  #Paroxysmal atrial flutter, rate controlled.  Continue home digoxin.  Not on any anticoagulation as outpatient. Add telemetry monitoring  Code Status: Full code  Family Communication: No family at bedside discussed with her family friend Mrs. Vy by phone on 12/12 at [phone]  Disposition Plan: Still orthostatic, starting IVF, compression stockings, monitor on telemetry, monitor BP On amlodipine. Not ready for discharge given still orthostatic on exam and quite symptomatic. Hold off on discharge and transition to inpatient status.      Antimicrobials: Anti-infectives (From admission, onward)   Start     Dose/Rate Route Frequency Ordered Stop   11/06/18 2200  cephALEXin (KEFLEX) capsule 500 mg  500 mg Oral Every 12 hours 11/06/18 1224      11/06/18 0600  cephALEXin (KEFLEX) capsule 500 mg  Status:  Discontinued     500 mg Oral Every 8 hours 11/05/18 1140 11/06/18 1224   11/05/18 1015  cefTRIAXone (ROCEPHIN) 1 g in sodium chloride 0.9 % 100 mL IVPB     1 g 200 mL/hr over 30 Minutes Intravenous  Once 11/05/18 1011 11/05/18 1054         DVT prophylaxis: lovenox   Objective: Vitals:   11/10/18 0412 11/10/18 1500 11/10/18 1914 11/11/18 0335  BP: (!) 158/78 (!) 142/59 112/60 (!) 149/85  Pulse: 60 65 64 (!) 55  Resp: 18 20 16 18   Temp: 98.5 F (36.9 C) 98 F (36.7 C) 98.9 F (37.2 C) 97.9 F (36.6 C)  TempSrc: Oral Oral Oral Oral  SpO2: 96% 98% 96% 98%  Weight:      Height:        Intake/Output Summary (Last 24 hours) at 11/11/2018 1336 Last data filed at 11/11/2018 0421 Gross per 24 hour  Intake 360 ml  Output 751 ml  Net -391 ml   Filed Weights   11/09/18 1900  Weight: 54.6 kg    Exam:  Constitutional: Elderly female, no distress Eyes: EOMI, anicteric, normal conjunctivae ENMT: Oropharynx with moist mucous membranes, normal dentition Cardiovascular: RRR no MRGs, with no peripheral edema Respiratory: Normal respiratory effort, clear breath sounds  Abdomen: Soft,non-tender, normal bowel sounds Skin: Bruising along lateral side of left rib cage, tenderness to palpation of left rib cage  Neurologic: Grossly no focal neuro deficit.  Decreased movement in bilateral legs proximally most likely due to pain ADDENDMUM Rectal: normal rectal tone, some external anal tags but no fissures and otherwise normal external exam Psychiatric:Appropriate affect, and mood. Mental status AAOx3  Data Reviewed: CBC: Recent Labs  Lab 11/05/18 0748 11/05/18 1229 11/07/18 0311 11/09/18 0229  WBC 12.4* 9.8 7.8 5.8  NEUTROABS 10.8*  --   --   --   HGB 15.3* 14.0 13.4 12.7  HCT 48.6* 45.0 41.7 40.7  MCV 100.8* 101.8* 99.3 100.5*  PLT 168 166 141* 165   Basic Metabolic Panel: Recent Labs  Lab 11/05/18 0748  11/05/18 1229 11/07/18 0311 11/09/18 0229  NA 142  --  141 141  K 4.2  --  3.9 4.0  CL 101  --  108 105  CO2 24  --  25 28  GLUCOSE 117*  --  98 94  BUN 17  --  10 20  CREATININE 0.88 0.72 0.65 0.77  CALCIUM 9.4  --  8.5* 8.8*   GFR: Estimated Creatinine Clearance: 44.3 mL/min (by C-G formula based on SCr of 0.77 mg/dL). Liver Function Tests: No results for input(s): AST, ALT, ALKPHOS, BILITOT, PROT, ALBUMIN in the last 168 hours. No results for input(s): LIPASE, AMYLASE in the last 168 hours. No results for input(s): AMMONIA in the last 168 hours. Coagulation Profile: No results for input(s): INR, PROTIME in the last 168 hours. Cardiac Enzymes: No results for input(s): CKTOTAL, CKMB, CKMBINDEX, TROPONINI in the last 168 hours. BNP (last 3 results) No results for input(s): PROBNP in the last 8760 hours. HbA1C: No results for input(s): HGBA1C in the last 72 hours. CBG: No results for input(s): GLUCAP in the last 168 hours. Lipid Profile: No results for input(s): CHOL, HDL, LDLCALC, TRIG, CHOLHDL, LDLDIRECT in the last 72 hours. Thyroid Function Tests: No results for input(s): TSH, T4TOTAL, FREET4, T3FREE, THYROIDAB in the  last 72 hours. Anemia Panel: No results for input(s): VITAMINB12, FOLATE, FERRITIN, TIBC, IRON, RETICCTPCT in the last 72 hours. Urine analysis:    Component Value Date/Time   COLORURINE YELLOW 11/05/2018 0939   APPEARANCEUR CLOUDY (A) 11/05/2018 0939   LABSPEC 1.018 11/05/2018 0939   PHURINE 5.0 11/05/2018 0939   GLUCOSEU NEGATIVE 11/05/2018 0939   HGBUR MODERATE (A) 11/05/2018 0939   BILIRUBINUR NEGATIVE 11/05/2018 0939   KETONESUR 5 (A) 11/05/2018 0939   PROTEINUR 30 (A) 11/05/2018 0939   UROBILINOGEN 0.2 01/23/2015 0959   NITRITE POSITIVE (A) 11/05/2018 0939   LEUKOCYTESUR TRACE (A) 11/05/2018 0939   Sepsis Labs: @LABRCNTIP (procalcitonin:4,lacticidven:4)  ) Recent Results (from the past 240 hour(s))  Urine Culture     Status: Abnormal    Collection Time: 11/05/18 10:50 AM  Result Value Ref Range Status   Specimen Description URINE, RANDOM  Final   Special Requests   Final    NONE Performed at [facility], [address], [place], [place] [address]    Culture >=100,000 COLONIES/mL ESCHERICHIA COLI (A)  Final   Report Status 11/07/2018 FINAL  Final   Organism ID, Bacteria ESCHERICHIA COLI (A)  Final      Susceptibility   Escherichia coli - MIC*    AMPICILLIN >=32 RESISTANT Resistant     CEFAZOLIN <=4 SENSITIVE Sensitive     CEFTRIAXONE <=1 SENSITIVE Sensitive     CIPROFLOXACIN <=0.25 SENSITIVE Sensitive     GENTAMICIN <=1 SENSITIVE Sensitive     IMIPENEM <=0.25 SENSITIVE Sensitive     NITROFURANTOIN <=16 SENSITIVE Sensitive     TRIMETH/SULFA <=20 SENSITIVE Sensitive     AMPICILLIN/SULBACTAM 16 INTERMEDIATE Intermediate     PIP/TAZO <=4 SENSITIVE Sensitive     Extended ESBL NEGATIVE Sensitive     * >=100,000 COLONIES/mL ESCHERICHIA COLI      Studies: Ct Pelvis Wo Contrast  Result Date: 11/11/2018 CLINICAL DATA:  Pain after fall. EXAM: CT PELVIS WITHOUT CONTRAST TECHNIQUE: Multidetector CT imaging of the pelvis was performed following the standard protocol without intravenous contrast. COMPARISON:  X-ray November 05, 2018 FINDINGS: Urinary Tract:  No abnormality visualized. Bowel: Colonic diverticulosis without diverticulitis. Apparent rectal wall thickening with mild increased attenuation in the surrounding fat. Vascular/Lymphatic: Atherosclerotic change in the aorta and branching vessels. No aneurysm noted. Reproductive:  No mass or other significant abnormality Other:  None. Musculoskeletal: Healed right pubic rami fractures. No hip fractures noted. Degenerative changes in the lower lumbar spine. IMPRESSION: 1. No acute fracture identified. Osteopenia. If concern persists, an MRI would be more sensitive in the evaluation for nondisplaced subtle fractures in the setting of osteopenia. 2. Apparent rectal wall  thickening with possible mild increased attenuation in the adjacent fat. This could represent inflammation of the rectum in the appropriate clinical setting. Recommend clinical correlation. Electronically Signed   By: Gerome Sam III M.D   On: 11/11/2018 09:17    Scheduled Meds: . acetaminophen  1,000 mg Oral TID  . acidophilus  1 capsule Oral Daily  . amLODipine  5 mg Oral Daily  . aspirin EC  81 mg Oral Daily  . cephALEXin  500 mg Oral Q12H  . digoxin  125 mcg Oral Daily  . docusate sodium  100 mg Oral Daily  . enoxaparin (LOVENOX) injection  40 mg Subcutaneous Q24H  . latanoprost  1 drop Both Eyes QHS  . lidocaine  1 patch Transdermal Q24H  . vitamin B-12  500 mcg Oral Daily    Continuous Infusions:  LOS: 2 days     Laverna Peace, MD [facility] Pager [phone]  If 7PM-7AM, please contact night-coverage [email or web address] Password [email or web address] 11/11/2018, 1:36 PM

## 2018-11-11 NOTE — Progress Notes (Signed)
RN called and spoke to [place] at [facility] who gave me the nursing supervisors number [place].

## 2018-11-12 ENCOUNTER — Inpatient Hospital Stay (HOSPITAL_COMMUNITY): Payer: Medicare Other

## 2018-11-12 DIAGNOSIS — D72829 Elevated white blood cell count, unspecified: Secondary | ICD-10-CM

## 2018-11-12 DIAGNOSIS — S81811A Laceration without foreign body, right lower leg, initial encounter: Secondary | ICD-10-CM

## 2018-11-12 DIAGNOSIS — R109 Unspecified abdominal pain: Secondary | ICD-10-CM

## 2018-11-12 DIAGNOSIS — K629 Disease of anus and rectum, unspecified: Secondary | ICD-10-CM

## 2018-11-12 DIAGNOSIS — S81812A Laceration without foreign body, left lower leg, initial encounter: Secondary | ICD-10-CM

## 2018-11-12 DIAGNOSIS — S2249XA Multiple fractures of ribs, unspecified side, initial encounter for closed fracture: Secondary | ICD-10-CM

## 2018-11-12 DIAGNOSIS — R1031 Right lower quadrant pain: Secondary | ICD-10-CM

## 2018-11-12 HISTORY — DX: Elevated white blood cell count, unspecified: D72.829

## 2018-11-12 LAB — BASIC METABOLIC PANEL
Anion gap: 9 (ref 5–15)
BUN: 12 mg/dL (ref 8–23)
CO2: 27 mmol/L (ref 22–32)
Calcium: 8.1 mg/dL — ABNORMAL LOW (ref 8.9–10.3)
Chloride: 103 mmol/L (ref 98–111)
Creatinine, Ser: 0.64 mg/dL (ref 0.44–1.00)
GFR calc Af Amer: >60 mL/min (ref >60)
GFR calc non Af Amer: >60 mL/min (ref >60)
Glucose, Bld: 106 mg/dL — ABNORMAL HIGH (ref 70–99)
Potassium: 3.8 mmol/L (ref 3.5–5.1)
Sodium: 139 mmol/L (ref 135–145)

## 2018-11-12 LAB — CREATININE, SERUM
Creatinine, Ser: 0.6 mg/dL (ref 0.44–1.00)
GFR calc Af Amer: >60 mL/min (ref >60)
GFR calc non Af Amer: >60 mL/min (ref >60)

## 2018-11-12 LAB — CBC
HCT: 39.5 % (ref 36.0–46.0)
Hemoglobin: 12.7 g/dL (ref 12.0–15.0)
MCH: 32.5 pg (ref 26.0–34.0)
MCHC: 32.2 g/dL (ref 30.0–36.0)
MCV: 101 fL — ABNORMAL HIGH (ref 80.0–100.0)
Platelets: 184 10*3/uL (ref 150–400)
RBC: 3.91 MIL/uL (ref 3.87–5.11)
RDW: 12.2 % (ref 11.5–15.5)
WBC: 13.1 10*3/uL — ABNORMAL HIGH (ref 4.0–10.5)
nRBC: 0 % (ref 0.0–0.2)

## 2018-11-12 MED ORDER — TRAMADOL HCL 50 MG PO TABS
25.0000 mg | ORAL_TABLET | Freq: Four times a day (QID) | ORAL | Status: DC | PRN
  Administered 2018-11-13 (×2): 25 mg via ORAL
  Filled 2018-11-12 (×2): qty 1

## 2018-11-12 MED ORDER — DOCUSATE SODIUM 100 MG PO CAPS: 100.0000 mg | ORAL_CAPSULE | Freq: Every day | ORAL | Status: DC | PRN

## 2018-11-12 MED ORDER — FAMOTIDINE 20 MG PO TABS
10.0000 mg | ORAL_TABLET | Freq: Two times a day (BID) | ORAL | Status: DC
  Administered 2018-11-12 – 2018-11-19 (×15): 10 mg via ORAL
  Filled 2018-11-12 (×15): qty 1

## 2018-11-12 MED ORDER — KETOROLAC TROMETHAMINE 15 MG/ML IJ SOLN
15.0000 mg | Freq: Two times a day (BID) | INTRAMUSCULAR | Status: DC
  Administered 2018-11-12 – 2018-11-15 (×6): 15 mg via INTRAVENOUS
  Filled 2018-11-12 (×6): qty 1

## 2018-11-12 NOTE — Progress Notes (Signed)
PROGRESS NOTE  [name] Weber ZOX:[id number] DOB: 08/01/1931 DOA: 11/05/2018 PCP: Marden NobleGates, Robert, MD  HPI/Brief Narrative  [name] Weber is a 84 y.o. year old female with medical history significant for fatigue, malaise, paroxysmal atrial fibrillation (not on anticoagulation) occasional palpitations osteoporosis, anxiety disorder, pulmonary hypertension, and remote pelvic fracture  who presented on 11/05/2018 to the ED after being found wedged between the toilet and her tub.  Upon discussion she states somehow she slid off the toilet denied any preceding signs or symptoms.  She became faint and dizzy upon initial attempt to ambulate in the ED prompting admission for further work-up.  She was found to have abnormal urinalysis with E. coli on urine culture, she does not remember any urinary symptoms but here has had urinary frequency concerning for UTI as well as multiple rib fractures on bilateral lower extremity skin tears in the setting of fall.    Subjective She is unable to specify if she is having hip pain or abdominal pain Still having intermittent formed stools with some loose stool Denies any fevers, no chest pain, no shortness of breath   Assessment/Plan:  #Minimally displaced lower left rib fractures, stable. S Lidocaine patch (started 12/10), PRN tramadol.  Supportive care with incentive spirometry  #Orthostatic hypotension, improving.  No longer orthostatic blood pressure much lower than before, likely iatrogenic related to recent start amlodipine.  Fortunately received dose this a.m.  Will discontinue we will continue to monitor.  Repeat orthostatic vitals on tomorrow, continue compression stockings to assist  #Bilateral hip pain. Still persistent pain despite pretty good regimen unfortunately she is not asking for PRNs which I think is related to her cognitive decline (MCI or dementia?).  Is difficult to tell if this is hip pain or belly pain as I press on her right lower  quadrant and suprapubic region she reports significant pain so we will evaluate with abdominal x-ray, depending on results may need CT abdomen specially considering recent UTI treatment CT scan of pelvis/hips negative for acute fracture (XR was the same on admission). Neurologically intact, normal rectal tone (checked 12/13)Continue scheduled tylenol  #Rectal wall thickening. Mentioned on CT pelvis/hips.  She has had episodes of not being able to control her bowels/fecal incontinence but this is been ongoing prior to admission per POA.Marland Kitchen.  Normal rectal tone on exam on 12/13.  Will discontinue stool softeners continue to monitor  #Leukocytosis.  Has remained afebrile.  No clear site of infection.  Ruling out any intra-abdominal pathology with x-ray.  #Unwitnessed fall.  Unclear etiology, weird setting of potential mechanical fall.  ED recommends SNF for continued therapy with mobility given balance deficits.  Likely related to orthostatic hypotension  #Hypertension, previously quite high during hospitalization now SBP's in mid 90s.  Likely iatrogenic related to recently start amlodipine.  Discontinued amlodipine.  Continue to monitor.    #E. coli UTI, completed 5 day course of keflex.   Patient unable to report any previous urinary symptoms prior to hospital stay but does report urinary frequency while here  #Bilateral lower extremity skin tears, stable.  Dressings in place.  Continue to monitor.  #Paroxysmal atrial flutter, rate controlled.  Continue home digoxin.  Not on any anticoagulation as outpatient. Add telemetry monitoring  #Poor nutrition, chronic.  Is not been ongoing since prior to admission.  Patient has intermittent appetite.  Consulted dietitian.  In the meantime scheduled Ensure with all 3 meals  Code Status: Full code  Family Communication: POA, Vi Appleton updated at  bedside  Disposition Plan: No longer orthostatic but SBP's now on lower side which is quite different from her  hospitalization which she has been significantly hypertensive we will continue IV fluids, discontinue amlodipine, work-up her abdominal pain with x-ray continue to monitor.     Antimicrobials: Anti-infectives (From admission, onward)   Start     Dose/Rate Route Frequency Ordered Stop   11/06/18 2200  cephALEXin (KEFLEX) capsule 500 mg  Status:  Discontinued     500 mg Oral Every 12 hours 11/06/18 1224 11/12/18 1313   11/06/18 0600  cephALEXin (KEFLEX) capsule 500 mg  Status:  Discontinued     500 mg Oral Every 8 hours 11/05/18 1140 11/06/18 1224   11/05/18 1015  cefTRIAXone (ROCEPHIN) 1 g in sodium chloride 0.9 % 100 mL IVPB     1 g 200 mL/hr over 30 Minutes Intravenous  Once 11/05/18 1011 11/05/18 1054         DVT prophylaxis: lovenox   Objective: Vitals:   11/12/18 0602 11/12/18 1233 11/12/18 1345 11/12/18 1346  BP: (!) 120/55  (!) 102/58 (!) 99/59  Pulse: 81   70  Resp:      Temp: 98.7 F (37.1 C)     TempSrc: Oral     SpO2: 94% 94%  95%  Weight:      Height:       No intake or output data in the 24 hours ending 11/12/18 1609 Filed Weights   11/09/18 1900  Weight: 54.6 kg    Exam:  Constitutional: Elderly female, no distress Eyes: EOMI, anicteric, normal conjunctivae ENMT: Oropharynx with moist mucous membranes, normal dentition Cardiovascular: RRR no MRGs, with no peripheral edema Respiratory: Normal respiratory effort, clear breath sounds  Abdomen: Soft, under with the palpation in suprapubic and right lower quadrant and left lower quadrant, no rebound tenderness no guarding normal bowel sounds Skin: Bruising along lateral side of left rib cage, tenderness to palpation of left rib cage  Neurologic: Grossly no focal neuro deficit.  Decreased movement in bilateral legs proximally most likely due to pain Psychiatric:Appropriate affect, and mood. Mental status AAOx3  Data Reviewed: CBC: Recent Labs  Lab 11/07/18 0311 11/09/18 0229 11/12/18 0756  WBC 7.8  5.8 13.1*  HGB 13.4 12.7 12.7  HCT 41.7 40.7 39.5  MCV 99.3 100.5* 101.0*  PLT 141* 165 184   Basic Metabolic Panel: Recent Labs  Lab 11/07/18 0311 11/09/18 0229 11/12/18 0636 11/12/18 0756  NA 141 141  --  139  K 3.9 4.0  --  3.8  CL 108 105  --  103  CO2 25 28  --  27  GLUCOSE 98 94  --  106*  BUN 10 20  --  12  CREATININE 0.65 0.77 0.60 0.64  CALCIUM 8.5* 8.8*  --  8.1*   GFR: Estimated Creatinine Clearance: 44.3 mL/min (by C-G formula based on SCr of 0.64 mg/dL). Liver Function Tests: No results for input(s): AST, ALT, ALKPHOS, BILITOT, PROT, ALBUMIN in the last 168 hours. No results for input(s): LIPASE, AMYLASE in the last 168 hours. No results for input(s): AMMONIA in the last 168 hours. Coagulation Profile: No results for input(s): INR, PROTIME in the last 168 hours. Cardiac Enzymes: No results for input(s): CKTOTAL, CKMB, CKMBINDEX, TROPONINI in the last 168 hours. BNP (last 3 results) No results for input(s): PROBNP in the last 8760 hours. HbA1C: No results for input(s): HGBA1C in the last 72 hours. CBG: No results for input(s): GLUCAP in the last  168 hours. Lipid Profile: No results for input(s): CHOL, HDL, LDLCALC, TRIG, CHOLHDL, LDLDIRECT in the last 72 hours. Thyroid Function Tests: No results for input(s): TSH, T4TOTAL, FREET4, T3FREE, THYROIDAB in the last 72 hours. Anemia Panel: No results for input(s): VITAMINB12, FOLATE, FERRITIN, TIBC, IRON, RETICCTPCT in the last 72 hours. Urine analysis:    Component Value Date/Time   COLORURINE YELLOW 11/05/2018 0939   APPEARANCEUR CLOUDY (A) 11/05/2018 0939   LABSPEC 1.018 11/05/2018 0939   PHURINE 5.0 11/05/2018 0939   GLUCOSEU NEGATIVE 11/05/2018 0939   HGBUR MODERATE (A) 11/05/2018 0939   BILIRUBINUR NEGATIVE 11/05/2018 0939   KETONESUR 5 (A) 11/05/2018 0939   PROTEINUR 30 (A) 11/05/2018 0939   UROBILINOGEN 0.2 01/23/2015 0959   NITRITE POSITIVE (A) 11/05/2018 0939   LEUKOCYTESUR TRACE (A)  11/05/2018 0939   Sepsis Labs: @LABRCNTIP (procalcitonin:4,lacticidven:4)  ) Recent Results (from the past 240 hour(s))  Urine Culture     Status: Abnormal   Collection Time: 11/05/18 10:50 AM  Result Value Ref Range Status   Specimen Description URINE, RANDOM  Final   Special Requests   Final    NONE Performed at [facility], [address], [place], [place] [address]    Culture >=100,000 COLONIES/mL ESCHERICHIA COLI (A)  Final   Report Status 11/07/2018 FINAL  Final   Organism ID, Bacteria ESCHERICHIA COLI (A)  Final      Susceptibility   Escherichia coli - MIC*    AMPICILLIN >=32 RESISTANT Resistant     CEFAZOLIN <=4 SENSITIVE Sensitive     CEFTRIAXONE <=1 SENSITIVE Sensitive     CIPROFLOXACIN <=0.25 SENSITIVE Sensitive     GENTAMICIN <=1 SENSITIVE Sensitive     IMIPENEM <=0.25 SENSITIVE Sensitive     NITROFURANTOIN <=16 SENSITIVE Sensitive     TRIMETH/SULFA <=20 SENSITIVE Sensitive     AMPICILLIN/SULBACTAM 16 INTERMEDIATE Intermediate     PIP/TAZO <=4 SENSITIVE Sensitive     Extended ESBL NEGATIVE Sensitive     * >=100,000 COLONIES/mL ESCHERICHIA COLI      Studies: No results found.  Scheduled Meds: . acetaminophen  1,000 mg Oral TID  . acidophilus  1 capsule Oral Daily  . aspirin EC  81 mg Oral Daily  . digoxin  125 mcg Oral Daily  . enoxaparin (LOVENOX) injection  40 mg Subcutaneous Q24H  . famotidine  10 mg Oral BID  . ketorolac  15 mg Intravenous BID  . latanoprost  1 drop Both Eyes QHS  . lidocaine  1 patch Transdermal Q24H  . vitamin B-12  500 mcg Oral Daily    Continuous Infusions:   LOS: 3 days     Laverna Peace, MD [facility] Pager [phone]  If 7PM-7AM, please contact night-coverage [email or web address] Password [email or web address] 11/12/2018, 4:09 PM

## 2018-11-12 NOTE — Plan of Care (Signed)

## 2018-11-13 ENCOUNTER — Encounter (HOSPITAL_COMMUNITY): Payer: Self-pay | Admitting: *Deleted

## 2018-11-13 DIAGNOSIS — R102 Pelvic and perineal pain: Secondary | ICD-10-CM

## 2018-11-13 LAB — CBC
HCT: 36.7 % (ref 36.0–46.0)
Hemoglobin: 11.5 g/dL — ABNORMAL LOW (ref 12.0–15.0)
MCH: 32.2 pg (ref 26.0–34.0)
MCHC: 31.3 g/dL (ref 30.0–36.0)
MCV: 102.8 fL — ABNORMAL HIGH (ref 80.0–100.0)
Platelets: 195 10*3/uL (ref 150–400)
RBC: 3.57 MIL/uL — ABNORMAL LOW (ref 3.87–5.11)
RDW: 12.1 % (ref 11.5–15.5)
WBC: 8.5 10*3/uL (ref 4.0–10.5)
nRBC: 0 % (ref 0.0–0.2)

## 2018-11-13 LAB — BASIC METABOLIC PANEL
Anion gap: 8 (ref 5–15)
BUN: 17 mg/dL (ref 8–23)
CO2: 24 mmol/L (ref 22–32)
Calcium: 8 mg/dL — ABNORMAL LOW (ref 8.9–10.3)
Chloride: 106 mmol/L (ref 98–111)
Creatinine, Ser: 0.65 mg/dL (ref 0.44–1.00)
GFR calc Af Amer: >60 mL/min (ref >60)
GFR calc non Af Amer: >60 mL/min (ref >60)
Glucose, Bld: 84 mg/dL (ref 70–99)
Potassium: 3.8 mmol/L (ref 3.5–5.1)
Sodium: 138 mmol/L (ref 135–145)

## 2018-11-13 NOTE — Progress Notes (Signed)
PROGRESS NOTE  [name] Policevelyn C Sifuentes ION:[id number]:[id number] DOB: 11/19/1931 DOA: 11/05/2018 PCP: Marden NobleGates, Robert, MD  HPI/Brief Narrative  [name] Weber is a 84 y.o. year old female with medical history significant for fatigue, malaise, paroxysmal atrial fibrillation (not on anticoagulation) occasional palpitations osteoporosis, anxiety disorder, pulmonary hypertension, and remote pelvic fracture  who presented on 11/05/2018 to the ED after being found wedged between the toilet and her tub.  Upon discussion she states somehow she slid off the toilet denied any preceding signs or symptoms.  She became faint and dizzy upon initial attempt to ambulate in the ED prompting admission for further work-up.  She was found to have abnormal urinalysis with E. coli on urine culture, she does not remember any urinary symptoms but here has had urinary frequency concerning for UTI as well as multiple rib fractures on bilateral lower extremity skin tears in the setting of fall.    Subjective She still having some abdominal discomfort on exam No nausea or vomiting Denies any difficulty urinating Still having BMs Denies any cough, shortness of breath   Assessment/Plan:  #Minimally displaced lower left rib fractures, stable.  Pain control with lidocaine patch (started 12/10), PRN tramadol (#), scheduled Toradol x5 days.  Supportive care with incentive spirometry  #Orthostatic hypotension, improving.  Blood pressure much more improved while off amlodipine (discontinued on 12/15).  Orthostatic vitals today upon standing her blood pressure remained 123/74 which was similar to blood pressure sitting 126/70, and line 117/87.  Continue to monitor.  Continue compression stockings  #Bilateral hip pain/abdominal pain.  Is very difficult to delineate if it is abdomen or hip but with palpation of suprapubic area and right lower quadrant patient in pain.  Abdominal x-ray nondiagnostic will pursue CT of abdomen.  Will be reassured if  no acute abnormalities likely are result of recent fall.  Remains neurologically intact, rectal tone intact on 12/13). Continue scheduled tylenol  #Rectal wall thickening. Mentioned on CT pelvis/hips.  She has had episodes of not being able to control her bowels/fecal incontinence but this hass been ongoing prior to admission per POA.Marland Kitchen.  Normal rectal tone on exam on 12/13.  Discontinued stool softeners, continue to monitor  #Leukocytosis, resolved.  Has remained afebrile.  No clear site of infection.    #Unwitnessed fall.  Unclear etiology, weird setting of potential mechanical fall.  PT recommends SNF for continued therapy with mobility given balance deficits.  Likely related to orthostatic hypotension  #Hypertension, previously quite high during hospitalization now SBP back on 1 teens to 120s while off amlodipine.    #E. coli UTI, completed 5 day course of keflex.   Patient unable to report any previous urinary symptoms prior to hospital stay but does report urinary frequency while here  #Bilateral lower extremity skin tears, stable.  Dressings in place.  Continue to monitor.  Wound care reconsulted to assist with dressing changes instructions for when ready for rehab facility  #Paroxysmal atrial flutter, rate controlled.  Continue home digoxin.  Not on any anticoagulation as outpatient.  Monitor on telemetry .  #Poor nutrition, chronic.  Has  been ongoing since prior to admission.  Patient has intermittent appetite.  Consulted dietitian.  In the meantime scheduled Ensure with all 3 meals  Code Status: Full code  Family Communication: POA, Vi Appleton updated at bedside  Disposition Plan: No longer orthostatic, blood pressure is better, still having some nonspecific abdominal/pelvic pain will assess with CT imaging, if negative anticipate being able to be discharged in 24  hours.       Antimicrobials: Anti-infectives (From admission, onward)   Start     Dose/Rate Route Frequency Ordered  Stop   11/06/18 2200  cephALEXin (KEFLEX) capsule 500 mg  Status:  Discontinued     500 mg Oral Every 12 hours 11/06/18 1224 11/12/18 1313   11/06/18 0600  cephALEXin (KEFLEX) capsule 500 mg  Status:  Discontinued     500 mg Oral Every 8 hours 11/05/18 1140 11/06/18 1224   11/05/18 1015  cefTRIAXone (ROCEPHIN) 1 g in sodium chloride 0.9 % 100 mL IVPB     1 g 200 mL/hr over 30 Minutes Intravenous  Once 11/05/18 1011 11/05/18 1054         DVT prophylaxis: lovenox   Objective: Vitals:   11/13/18 1247 11/13/18 1251 11/13/18 1256 11/13/18 1554  BP: 126/70 (!) 137/106 (!) 147/66 101/81  Pulse: 80 90 (!) 101 67  Resp:  20 (!) 24   Temp:    98.1 F (36.7 C)  TempSrc:    Oral  SpO2: 96% 94% 99% 97%  Weight:      Height:       No intake or output data in the 24 hours ending 11/13/18 1801 Filed Weights   11/09/18 1900  Weight: 54.6 kg    Exam:  Constitutional: Elderly female, no distress Eyes: EOMI, anicteric, normal conjunctivae ENMT: Oropharynx with moist mucous membranes, normal dentition Cardiovascular: RRR no MRGs, with no peripheral edema Respiratory: Normal respiratory effort, clear breath sounds  Abdomen: Soft, tenderness to deep palpation suprapubic area and right lower quadrant no rebound or guarding  Skin: Bruising along lateral side of left rib cage, tenderness to palpation of left rib cage  Neurologic: Grossly no focal neuro deficit.  Able to move lower extremities upon command much improved in comparison to prior exams  Psychiatric:Appropriate affect, and mood. Mental status AAOx3  Data Reviewed: CBC: Recent Labs  Lab 11/07/18 0311 11/09/18 0229 11/12/18 0756 11/13/18 0413  WBC 7.8 5.8 13.1* 8.5  HGB 13.4 12.7 12.7 11.5*  HCT 41.7 40.7 39.5 36.7  MCV 99.3 100.5* 101.0* 102.8*  PLT 141* 165 184 195   Basic Metabolic Panel: Recent Labs  Lab 11/07/18 0311 11/09/18 0229 11/12/18 0636 11/12/18 0756 11/13/18 0413  NA 141 141  --  139 138  K 3.9 4.0   --  3.8 3.8  CL 108 105  --  103 106  CO2 25 28  --  27 24  GLUCOSE 98 94  --  106* 84  BUN 10 20  --  12 17  CREATININE 0.65 0.77 0.60 0.64 0.65  CALCIUM 8.5* 8.8*  --  8.1* 8.0*   GFR: Estimated Creatinine Clearance: 44.3 mL/min (by C-G formula based on SCr of 0.65 mg/dL). Liver Function Tests: No results for input(s): AST, ALT, ALKPHOS, BILITOT, PROT, ALBUMIN in the last 168 hours. No results for input(s): LIPASE, AMYLASE in the last 168 hours. No results for input(s): AMMONIA in the last 168 hours. Coagulation Profile: No results for input(s): INR, PROTIME in the last 168 hours. Cardiac Enzymes: No results for input(s): CKTOTAL, CKMB, CKMBINDEX, TROPONINI in the last 168 hours. BNP (last 3 results) No results for input(s): PROBNP in the last 8760 hours. HbA1C: No results for input(s): HGBA1C in the last 72 hours. CBG: No results for input(s): GLUCAP in the last 168 hours. Lipid Profile: No results for input(s): CHOL, HDL, LDLCALC, TRIG, CHOLHDL, LDLDIRECT in the last 72 hours. Thyroid Function Tests: No results  for input(s): TSH, T4TOTAL, FREET4, T3FREE, THYROIDAB in the last 72 hours. Anemia Panel: No results for input(s): VITAMINB12, FOLATE, FERRITIN, TIBC, IRON, RETICCTPCT in the last 72 hours. Urine analysis:    Component Value Date/Time   COLORURINE YELLOW 11/05/2018 0939   APPEARANCEUR CLOUDY (A) 11/05/2018 0939   LABSPEC 1.018 11/05/2018 0939   PHURINE 5.0 11/05/2018 0939   GLUCOSEU NEGATIVE 11/05/2018 0939   HGBUR MODERATE (A) 11/05/2018 0939   BILIRUBINUR NEGATIVE 11/05/2018 0939   KETONESUR 5 (A) 11/05/2018 0939   PROTEINUR 30 (A) 11/05/2018 0939   UROBILINOGEN 0.2 01/23/2015 0959   NITRITE POSITIVE (A) 11/05/2018 0939   LEUKOCYTESUR TRACE (A) 11/05/2018 0939   Sepsis Labs: @LABRCNTIP (procalcitonin:4,lacticidven:4)  ) Recent Results (from the past 240 hour(s))  Urine Culture     Status: Abnormal   Collection Time: 11/05/18 10:50 AM  Result Value Ref  Range Status   Specimen Description URINE, RANDOM  Final   Special Requests   Final    NONE Performed at [facility], [address], [place], [place] [address]    Culture >=100,000 COLONIES/mL ESCHERICHIA COLI (A)  Final   Report Status 11/07/2018 FINAL  Final   Organism ID, Bacteria ESCHERICHIA COLI (A)  Final      Susceptibility   Escherichia coli - MIC*    AMPICILLIN >=32 RESISTANT Resistant     CEFAZOLIN <=4 SENSITIVE Sensitive     CEFTRIAXONE <=1 SENSITIVE Sensitive     CIPROFLOXACIN <=0.25 SENSITIVE Sensitive     GENTAMICIN <=1 SENSITIVE Sensitive     IMIPENEM <=0.25 SENSITIVE Sensitive     NITROFURANTOIN <=16 SENSITIVE Sensitive     TRIMETH/SULFA <=20 SENSITIVE Sensitive     AMPICILLIN/SULBACTAM 16 INTERMEDIATE Intermediate     PIP/TAZO <=4 SENSITIVE Sensitive     Extended ESBL NEGATIVE Sensitive     * >=100,000 COLONIES/mL ESCHERICHIA COLI      Studies: Dg Abd 2 Views  Result Date: 11/12/2018 CLINICAL DATA:  Abdominal pain, nausea EXAM: ABDOMEN - 2 VIEW COMPARISON:  None. FINDINGS: IVC filter in place. Nonobstructive bowel gas pattern. No organomegaly or free air. No suspicious calcification. Small to moderate left pleural effusion with left lower lobe atelectasis or infiltrate. IMPRESSION: No acute findings in the abdomen. Small to moderate left pleural effusion with left base atelectasis or infiltrate. Electronically Signed   By: Charlett Nose M.D.   On: 11/12/2018 19:35    Scheduled Meds: . acetaminophen  1,000 mg Oral TID  . acidophilus  1 capsule Oral Daily  . aspirin EC  81 mg Oral Daily  . digoxin  125 mcg Oral Daily  . enoxaparin (LOVENOX) injection  40 mg Subcutaneous Q24H  . famotidine  10 mg Oral BID  . ketorolac  15 mg Intravenous BID  . latanoprost  1 drop Both Eyes QHS  . lidocaine  1 patch Transdermal Q24H  . vitamin B-12  500 mcg Oral Daily    Continuous Infusions:   LOS: 4 days     Laverna Peace, MD [facility] Pager  [phone]  If 7PM-7AM, please contact night-coverage [email or web address] Password [email or web address] 11/13/2018, 6:01 PM

## 2018-11-14 DIAGNOSIS — R110 Nausea: Secondary | ICD-10-CM

## 2018-11-14 LAB — BASIC METABOLIC PANEL
Anion gap: 10 (ref 5–15)
BUN: 15 mg/dL (ref 8–23)
CO2: 25 mmol/L (ref 22–32)
Calcium: 8.5 mg/dL — ABNORMAL LOW (ref 8.9–10.3)
Chloride: 106 mmol/L (ref 98–111)
Creatinine, Ser: 0.73 mg/dL (ref 0.44–1.00)
GFR calc Af Amer: >60 mL/min (ref >60)
GFR calc non Af Amer: >60 mL/min (ref >60)
Glucose, Bld: 94 mg/dL (ref 70–99)
Potassium: 3.8 mmol/L (ref 3.5–5.1)
Sodium: 141 mmol/L (ref 135–145)

## 2018-11-14 LAB — CBC
HCT: 38.7 % (ref 36.0–46.0)
Hemoglobin: 12.4 g/dL (ref 12.0–15.0)
MCH: 32.4 pg (ref 26.0–34.0)
MCHC: 32 g/dL (ref 30.0–36.0)
MCV: 101 fL — ABNORMAL HIGH (ref 80.0–100.0)
Platelets: 220 10*3/uL (ref 150–400)
RBC: 3.83 MIL/uL — ABNORMAL LOW (ref 3.87–5.11)
RDW: 11.9 % (ref 11.5–15.5)
WBC: 8.5 10*3/uL (ref 4.0–10.5)
nRBC: 0 % (ref 0.0–0.2)

## 2018-11-14 NOTE — Consult Note (Signed)
WOC Nurse wound consult note [facility]  No family present. Reason for Consult: Skin tears to BLE Wound type: Skin tears on BLE There is a skin tear to the LLE pretibial area that is missing the skin flap.  It had Xeroform gauze and a foam dressing in place.  There is a skin tear to the RLE with the skin flap.  This had dry gauze and a foam dressing.  I was able to remove the dry gauze without tearing off the skin flap. Dressing procedure/placement/frequency: Place Xeroform gauze [place] [phone]) over all skin tears on legs.  Cover with foam pads.  Change the foam pads every 5 days.  Change the Xeroform gauze every day. Monitor the wound area(s) for worsening of condition such as: Signs/symptoms of infection,  Increase in size,  Development of or worsening of odor, Development of pain, or increased pain at the affected locations.  Notify the medical team if any of these develop.  Thank you for the consult.  Discussed plan of care with the patient and bedside nurse.  WOC nurse will not follow at this time.  Please re-consult the WOC team if needed.  Helmut MusterSherry Chestine Belknap, RN, MSN, CWOCN, CNS-BC, pager [phone]

## 2018-11-14 NOTE — Plan of Care (Signed)
  Problem: Education: Goal: Knowledge of General Education information will improve Description Including pain rating scale, medication(s)/side effects and non-pharmacologic comfort measures Outcome: Progressing   Problem: Health Behavior/Discharge Planning: Goal: Ability to manage health-related needs will improve Outcome: Progressing   Problem: Coping: Goal: Level of anxiety will decrease Outcome: Progressing   Problem: Elimination: Goal: Will not experience complications related to urinary retention Outcome: Progressing   Problem: Safety: Goal: Ability to remain free from injury will improve Outcome: Progressing   Problem: Skin Integrity: Goal: Risk for impaired skin integrity will decrease Outcome: Progressing   

## 2018-11-14 NOTE — Progress Notes (Signed)
PROGRESS NOTE  [name] Weber:[id number] DOB: 07-Jul-1931 DOA: 11/05/2018 PCP: Marden Noble, MD  HPI/Brief Narrative  [name] Weber is a 84 y.o. year old female with medical history significant for fatigue, malaise, paroxysmal atrial fibrillation (not on anticoagulation) occasional palpitations osteoporosis, anxiety disorder, pulmonary hypertension, and remote pelvic fracture  who presented on 11/05/2018 to the ED after being found wedged between the toilet and her tub.  Upon discussion she states somehow she slid off the toilet denied any preceding signs or symptoms.  She became faint and dizzy upon initial attempt to ambulate in the ED prompting admission for further work-up.  She was found to have abnormal urinalysis with E. coli on urine culture, she does not remember any urinary symptoms but here has had urinary frequency concerning for UTI as well as multiple rib fractures on bilateral lower extremity skin tears in the setting of fall.    Subjective Reports nausea Still having some lower belly pain, no diarrhea No fevers or chills No acute events overnight   Assessment/Plan:  #Minimally displaced lower left rib fractures, stable.  Pain control with lidocaine patch (started 12/10), PRN tramadol (#), scheduled Toradol x5 days.  Supportive care with incentive spirometry  #Orthostatic hypotension, improving.  Blood pressure much more improved while off amlodipine (discontinued on 12/15).  No longer orthostatic on vitals checked on 12/15.  Continue to monitor.  Continue compression stockings  #Bilateral hip pain/abdominal pain.  Is very difficult to delineate if this is abdomen or hip but with palpation of suprapubic area and right lower quadrant patient in pain and with evident nausea.  Abdominal x-ray nondiagnostic, still awaiting CT abdome, supportive care with IV antiemetics and pain control.  Will be reassured if no acute abnormalities likely are result of recent fall.  Remains  neurologically intact, rectal tone intact on 12/13). Continue scheduled tylenol  #Rectal wall thickening. Mentioned on CT pelvis/hips.  She has had episodes of not being able to control her bowels/fecal incontinence but this hass been ongoing prior to admission per POA.Marland Kitchen  Normal rectal tone on exam on 12/13.  Discontinued stool softeners, continue to monitor  #Leukocytosis, resolved.  Has remained afebrile.  No clear site of infection.    #Unwitnessed fall.  Unclear etiology, weird setting of potential mechanical fall.  PT recommends SNF for continued therapy with mobility given balance deficits.  Likely related to orthostatic hypotension  #Hypertension, previously quite high during hospitalization now SBP back on 1 teens to 120s while off amlodipine.    #E. coli UTI, completed 5 day course of keflex.   Patient unable to report any previous urinary symptoms prior to hospital stay but does report urinary frequency while here  #Bilateral lower extremity skin tears, stable.  Dressings in place.  Continue to monitor.  Wound care reconsulted to assist with dressing changes instructions for when ready for rehab facility  #Paroxysmal atrial flutter, rate controlled.  Continue home digoxin.  Not on any anticoagulation as outpatient.  Monitor on telemetry .  #Poor nutrition, chronic.  Has  been ongoing since prior to admission.  Patient has intermittent appetite.  Consulted dietitian.  In the meantime scheduled Ensure with all 3 meals  Code Status: Full code  Family Communication: POA, Vi Appleton updated at bedside  Disposition Plan: No longer orthostatic, blood pressure is better, still having some nonspecific abdominal/pelvic pain will assess with CT imaging, if negative anticipate being able to be discharged in 24 hours.       Antimicrobials: Anti-infectives (  From admission, onward)   Start     Dose/Rate Route Frequency Ordered Stop   11/06/18 2200  cephALEXin (KEFLEX) capsule 500 mg   Status:  Discontinued     500 mg Oral Every 12 hours 11/06/18 1224 11/12/18 1313   11/06/18 0600  cephALEXin (KEFLEX) capsule 500 mg  Status:  Discontinued     500 mg Oral Every 8 hours 11/05/18 1140 11/06/18 1224   11/05/18 1015  cefTRIAXone (ROCEPHIN) 1 g in sodium chloride 0.9 % 100 mL IVPB     1 g 200 mL/hr over 30 Minutes Intravenous  Once 11/05/18 1011 11/05/18 1054         DVT prophylaxis: lovenox   Objective: Vitals:   11/14/18 0533 11/14/18 1026 11/14/18 1207 11/14/18 1339  BP: (!) 162/81  (!) 149/64 111/74  Pulse: 72 80 78 74  Resp:   17 17  Temp: 98.2 F (36.8 C)   98.3 F (36.8 C)  TempSrc: Oral   Oral  SpO2: 94%  96% 96%  Weight:      Height:        Intake/Output Summary (Last 24 hours) at 11/14/2018 1817 Last data filed at 11/14/2018 1230 Gross per 24 hour  Intake 360 ml  Output 300 ml  Net 60 ml   Filed Weights   11/09/18 1900  Weight: 54.6 kg    Exam:  Constitutional: Elderly female, no distress Eyes: EOMI, anicteric, normal conjunctivae ENMT: Oropharynx with moist mucous membranes, normal dentition Cardiovascular: RRR no MRGs, with no peripheral edema Respiratory: Normal respiratory effort, clear breath sounds  Abdomen: Soft, tenderness to deep palpation suprapubic area and right lower quadrant no rebound or guarding  Skin: Bruising along lateral side of left rib cage, tenderness to palpation of left rib cage  Neurologic: Grossly no focal neuro deficit.  Able to move lower extremities upon command much improved in comparison to prior exams  Psychiatric:Appropriate affect, and mood. Mental status AAOx3  Data Reviewed: CBC: Recent Labs  Lab 11/09/18 0229 11/12/18 0756 11/13/18 0413 11/14/18 0318  WBC 5.8 13.1* 8.5 8.5  HGB 12.7 12.7 11.5* 12.4  HCT 40.7 39.5 36.7 38.7  MCV 100.5* 101.0* 102.8* 101.0*  PLT 165 184 195 220   Basic Metabolic Panel: Recent Labs  Lab 11/09/18 0229 11/12/18 0636 11/12/18 0756 11/13/18 0413  11/14/18 0318  NA 141  --  139 138 141  K 4.0  --  3.8 3.8 3.8  CL 105  --  103 106 106  CO2 28  --  27 24 25   GLUCOSE 94  --  106* 84 94  BUN 20  --  12 17 15   CREATININE 0.77 0.60 0.64 0.65 0.73  CALCIUM 8.8*  --  8.1* 8.0* 8.5*   GFR: Estimated Creatinine Clearance: 44.3 mL/min (by C-G formula based on SCr of 0.73 mg/dL). Liver Function Tests: No results for input(s): AST, ALT, ALKPHOS, BILITOT, PROT, ALBUMIN in the last 168 hours. No results for input(s): LIPASE, AMYLASE in the last 168 hours. No results for input(s): AMMONIA in the last 168 hours. Coagulation Profile: No results for input(s): INR, PROTIME in the last 168 hours. Cardiac Enzymes: No results for input(s): CKTOTAL, CKMB, CKMBINDEX, TROPONINI in the last 168 hours. BNP (last 3 results) No results for input(s): PROBNP in the last 8760 hours. HbA1C: No results for input(s): HGBA1C in the last 72 hours. CBG: No results for input(s): GLUCAP in the last 168 hours. Lipid Profile: No results for input(s): CHOL, HDL, LDLCALC,  TRIG, CHOLHDL, LDLDIRECT in the last 72 hours. Thyroid Function Tests: No results for input(s): TSH, T4TOTAL, FREET4, T3FREE, THYROIDAB in the last 72 hours. Anemia Panel: No results for input(s): VITAMINB12, FOLATE, FERRITIN, TIBC, IRON, RETICCTPCT in the last 72 hours. Urine analysis:    Component Value Date/Time   COLORURINE YELLOW 11/05/2018 0939   APPEARANCEUR CLOUDY (A) 11/05/2018 0939   LABSPEC 1.018 11/05/2018 0939   PHURINE 5.0 11/05/2018 0939   GLUCOSEU NEGATIVE 11/05/2018 0939   HGBUR MODERATE (A) 11/05/2018 0939   BILIRUBINUR NEGATIVE 11/05/2018 0939   KETONESUR 5 (A) 11/05/2018 0939   PROTEINUR 30 (A) 11/05/2018 0939   UROBILINOGEN 0.2 01/23/2015 0959   NITRITE POSITIVE (A) 11/05/2018 0939   LEUKOCYTESUR TRACE (A) 11/05/2018 0939   Sepsis Labs: @LABRCNTIP (procalcitonin:4,lacticidven:4)  ) Recent Results (from the past 240 hour(s))  Urine Culture     Status: Abnormal    Collection Time: 11/05/18 10:50 AM  Result Value Ref Range Status   Specimen Description URINE, RANDOM  Final   Special Requests   Final    NONE Performed at [facility], [address] [place], [place] [address]    Culture >=100,000 COLONIES/mL ESCHERICHIA COLI (A)  Final   Report Status 11/07/2018 FINAL  Final   Organism ID, Bacteria ESCHERICHIA COLI (A)  Final      Susceptibility   Escherichia coli - MIC*    AMPICILLIN >=32 RESISTANT Resistant     CEFAZOLIN <=4 SENSITIVE Sensitive     CEFTRIAXONE <=1 SENSITIVE Sensitive     CIPROFLOXACIN <=0.25 SENSITIVE Sensitive     GENTAMICIN <=1 SENSITIVE Sensitive     IMIPENEM <=0.25 SENSITIVE Sensitive     NITROFURANTOIN <=16 SENSITIVE Sensitive     TRIMETH/SULFA <=20 SENSITIVE Sensitive     AMPICILLIN/SULBACTAM 16 INTERMEDIATE Intermediate     PIP/TAZO <=4 SENSITIVE Sensitive     Extended ESBL NEGATIVE Sensitive     * >=100,000 COLONIES/mL ESCHERICHIA COLI      Studies: No results found.  Scheduled Meds: . acetaminophen  1,000 mg Oral TID  . acidophilus  1 capsule Oral Daily  . aspirin EC  81 mg Oral Daily  . digoxin  125 mcg Oral Daily  . enoxaparin (LOVENOX) injection  40 mg Subcutaneous Q24H  . famotidine  10 mg Oral BID  . ketorolac  15 mg Intravenous BID  . latanoprost  1 drop Both Eyes QHS  . lidocaine  1 patch Transdermal Q24H  . vitamin B-12  500 mcg Oral Daily    Continuous Infusions:   LOS: 3 days     Laverna PeaceShayla D Nettey, MD [facility] Pager [phone]  If 7PM-7AM, please contact night-coverage [email or web address] Password [email or web address] 11/14/2018, 6:17 PM

## 2018-11-14 NOTE — Progress Notes (Signed)
[facility] as facility had to give her bed away due to delays in discharge.   When patient medically stable CSW will assess other SNF options with family.   Beecher FallsAshley Ermina Weber, [place] [phone]

## 2018-11-14 NOTE — Progress Notes (Signed)
Physical Therapy Treatment Patient Details Name: [name] Weber MRN: [id number] DOB: 04/01/1931 Today's Date: 11/14/2018    History of Present Illness Pt is an 84 y/o female with a past medical history of Fatigue, Malaise, Myalgia, PAC (premature atrial contraction), Palpitations, Paroxysmal atrial flutter, and PVC's (premature ventricular contractions). Pt sustained a fall at home and was wedged between tub and toilet. Pt became faint and dizzy with initial attempts to ambulate in ED, admitted for further workup. Imaging revealed multiple left rib fractures of the lower ribs.  No pneumothorax.     PT Comments    Patient seen for mobility progression. Pt presents with generalized weakness and reports not eating dinner or breakfast due to nausea. Pt did report that nausea had subsided prior to session. Pt tolerated short distance gait training with RW and min A. Pt became SOB while ambulating and required standing rest break then seated rest break and ultimately wheeled back to room. Vitals WNL. Pt left in recliner with lunch tray, needs met, and family member present. Continue to progress as tolerated with anticipated d/c to SNF for further skilled PT services.     Follow Up Recommendations  SNF;Supervision/Assistance - 24 hour     Equipment Recommendations  None recommended by PT    Recommendations for Other Services OT consult(as ordered)     Precautions / Restrictions Precautions Precautions: Fall Precaution Comments: L rib fxs    Mobility  Bed Mobility Overal bed mobility: Needs Assistance Bed Mobility: Rolling;Sidelying to Sit Rolling: Min guard Sidelying to sit: Min assist       General bed mobility comments: use of rail and cues for sequencing  Transfers Overall transfer level: Needs assistance Equipment used: Rolling walker (2 wheeled) Transfers: Sit to/from [facility] Sit to Stand: Min assist Stand pivot transfers: Mod assist;Min assist        General transfer comment: assist to guide RW when pivoting; cues for safe hand placement; assist to steady when standing and sitting; poor eccentric loading   Ambulation/Gait Ambulation/Gait assistance: Min assist Gait Distance (Feet): (94f then 162fwith standing rest break due to SOB) Assistive device: Rolling walker (2 wheeled) Gait Pattern/deviations: Step-through pattern;Decreased stride length;Trunk flexed;Decreased dorsiflexion - right;Decreased dorsiflexion - left Gait velocity: decreased   General Gait Details: pt became fatigued quickly and with SOB with mobility; vitals WNL; pt with poor proprioception and assist needed to steady; no LOB    Stairs             Wheelchair Mobility    Modified Rankin (Stroke Patients Only)       Balance Overall balance assessment: Needs assistance Sitting-balance support: Feet supported Sitting balance-Leahy Scale: Good     Standing balance support: Bilateral upper extremity supported;During functional activity Standing balance-Leahy Scale: Poor                              Cognition Arousal/Alertness: Awake/alert Behavior During Therapy: WFL for tasks assessed/performed Overall Cognitive Status: Within Functional Limits for tasks assessed                                 General Comments: HOH; seems a little confused initially but appropriately answers orientation questions and follows commands      Exercises      General Comments General comments (skin integrity, edema, etc.): pt reports not eating dinner yesterday or breakfast this  am-RN aware and pt encouraged to attempt eating as much of lunch tray as possible; pt reported that nausea has subsided prior to PT arrival      Pertinent Vitals/Pain Pain Assessment: No/denies pain Pain Location: bilat LE/feet Pain Descriptors / Indicators: (decreased sensation) Pain Intervention(s): Monitored during session    Home Living                       Prior Function            PT Goals (current goals can now be found in the care plan section) Acute Rehab PT Goals Patient Stated Goal: to go home to her dog Progress towards PT goals: Progressing toward goals    Frequency    Min 3X/week      PT Plan Current plan remains appropriate    Co-evaluation              AM-PAC PT "6 Clicks" Mobility   Outcome Measure  Help needed turning from your back to your side while in a flat bed without using bedrails?: A Lot Help needed moving from lying on your back to sitting on the side of a flat bed without using bedrails?: A Lot Help needed moving to and from a bed to a chair (including a wheelchair)?: A Little Help needed standing up from a chair using your arms (e.g., wheelchair or bedside chair)?: A Little Help needed to walk in hospital room?: A Little Help needed climbing 3-5 steps with a railing? : A Lot 6 Click Score: 15    End of Session Equipment Utilized During Treatment: Gait belt Activity Tolerance: Patient tolerated treatment well Patient left: in chair;with call bell/phone within reach;with chair alarm set;with family/visitor present Nurse Communication: Mobility status PT Visit Diagnosis: Unsteadiness on feet (R26.81);Other abnormalities of gait and mobility (R26.89);Muscle weakness (generalized) (M62.81);History of falling (Z91.81)     Time: 1127-1204 PT Time Calculation (min) (ACUTE ONLY): 37 min  Charges:  $Gait Training: 8-22 mins $Therapeutic Activity: 8-22 mins                     Earney Navy, PTA Acute Rehabilitation Services Pager: [phone] Office: [phone]     Darliss Cheney 11/14/2018, 12:19 PM

## 2018-11-14 NOTE — Progress Notes (Signed)
Initial Nutrition Assessment  DOCUMENTATION CODES:   Non-severe (moderate) malnutrition in context of chronic illness, Underweight  INTERVENTION:    Hormel Shake (Vital Cuisine) TID with meals, each supplement provides 520 kcals and 23 grams of protein  [employer] Breakfast Essentials for HS snack daily  NUTRITION DIAGNOSIS:   Moderate Malnutrition related to chronic illness(CAD) as evidenced by mild fat depletion, moderate fat depletion, moderate muscle depletion, severe muscle depletion.  GOAL:   Patient will meet greater than or equal to 90% of their needs  MONITOR:   PO intake, Supplement acceptance, Labs, I & O's  REASON FOR ASSESSMENT:   Consult Assessment of nutrition requirement/status  ASSESSMENT:   84 yo female with PMH of CAD who was admitted with E. coli UTI and multiple rib fractures s/p fall at home.   Spoke with patient and three friends in her room, two of these friends are neighbors who assist patient with meals. One helps her with breakfast and lunch and the other friend cooks dinner and brings them to her in the evening. Patient eats a variety of foods, but suspect she is not consuming adequate amounts. Friends state that patient does not eat when she is at home by herself. Encouraged patient to have three snacks per day between meals. She agreed to try Vital Cuisine Hormel shakes and [employer] Breakfast Essentials shakes.  Labs reviewed. Medications reviewed and include acidophilus, vitamin B-12.  Per review of weight encounters, patient has gained 15 lbs in the past 10 months.   NUTRITION - FOCUSED PHYSICAL EXAM:    Most Recent Value  Orbital Region  Mild depletion  Upper Arm Region  Mild depletion  Thoracic and Lumbar Region  Mild depletion  Buccal Region  Moderate depletion  Temple Region  Moderate depletion  Clavicle Bone Region  Severe depletion  Clavicle and Acromion Bone Region  Moderate depletion  Scapular Bone Region  Moderate depletion   Dorsal Hand  Severe depletion  Patellar Region  No depletion  Anterior Thigh Region  No depletion  Posterior Calf Region  No depletion  Edema (RD Assessment)  None  Hair  Reviewed  Eyes  Reviewed  Mouth  Reviewed  Skin  Reviewed  Nails  Reviewed       Diet Order:   Diet Order            Diet - low sodium heart healthy        Diet regular Room service appropriate? Yes; Fluid consistency: Thin  Diet effective now              EDUCATION NEEDS:   No education needs have been identified at this time  Skin:  Skin Assessment: Skin Integrity Issues:(skin tear x 2, excoriated area to coccyx)  Last BM:  12/15 (type 7)  Height:   Ht Readings from Last 1 Encounters:  11/09/18 5\' 8"  (1.727 m)    Weight:   Wt Readings from Last 1 Encounters:  11/09/18 54.6 kg    Ideal Body Weight:  63.6 kg  BMI:  Body mass index is 18.31 kg/m.  Estimated Nutritional Needs:   Kcal:  1500-1700  Protein:  75-85 gm  Fluid:  >/= 1.5 L    Joaquin CourtsKimberly Tesa Meadors, RD, LDN, CNSC Pager [phone] After Hours Pager [phone]

## 2018-11-15 ENCOUNTER — Inpatient Hospital Stay (HOSPITAL_COMMUNITY): Payer: Medicare Other

## 2018-11-15 DIAGNOSIS — E440 Moderate protein-calorie malnutrition: Secondary | ICD-10-CM

## 2018-11-15 DIAGNOSIS — E785 Hyperlipidemia, unspecified: Secondary | ICD-10-CM

## 2018-11-15 DIAGNOSIS — K5909 Other constipation: Secondary | ICD-10-CM

## 2018-11-15 DIAGNOSIS — R7989 Other specified abnormal findings of blood chemistry: Secondary | ICD-10-CM

## 2018-11-15 DIAGNOSIS — J90 Pleural effusion, not elsewhere classified: Secondary | ICD-10-CM

## 2018-11-15 DIAGNOSIS — I517 Cardiomegaly: Secondary | ICD-10-CM | POA: Diagnosis present

## 2018-11-15 HISTORY — DX: Pleural effusion, not elsewhere classified: J90

## 2018-11-15 HISTORY — DX: Other specified abnormal findings of blood chemistry: R79.89

## 2018-11-15 HISTORY — DX: Moderate protein-calorie malnutrition: E44.0

## 2018-11-15 LAB — HEPATIC FUNCTION PANEL
ALT: 19 U/L (ref 0–44)
AST: 16 U/L (ref 15–41)
Albumin: 2.4 g/dL — ABNORMAL LOW (ref 3.5–5.0)
Alkaline Phosphatase: 62 U/L (ref 38–126)
Bilirubin, Direct: <0.1 mg/dL (ref 0.0–0.2)
Total Bilirubin: 0.5 mg/dL (ref 0.3–1.2)
Total Protein: 5.3 g/dL — ABNORMAL LOW (ref 6.5–8.1)

## 2018-11-15 LAB — BASIC METABOLIC PANEL
Anion gap: 8 (ref 5–15)
BUN: 14 mg/dL (ref 8–23)
CO2: 26 mmol/L (ref 22–32)
Calcium: 8.2 mg/dL — ABNORMAL LOW (ref 8.9–10.3)
Chloride: 106 mmol/L (ref 98–111)
Creatinine, Ser: 0.76 mg/dL (ref 0.44–1.00)
GFR calc Af Amer: >60 mL/min (ref >60)
GFR calc non Af Amer: >60 mL/min (ref >60)
Glucose, Bld: 96 mg/dL (ref 70–99)
Potassium: 3.8 mmol/L (ref 3.5–5.1)
Sodium: 140 mmol/L (ref 135–145)

## 2018-11-15 LAB — CBC
HCT: 37.7 % (ref 36.0–46.0)
Hemoglobin: 11.9 g/dL — ABNORMAL LOW (ref 12.0–15.0)
MCH: 32.1 pg (ref 26.0–34.0)
MCHC: 31.6 g/dL (ref 30.0–36.0)
MCV: 101.6 fL — ABNORMAL HIGH (ref 80.0–100.0)
Platelets: 256 10*3/uL (ref 150–400)
RBC: 3.71 MIL/uL — ABNORMAL LOW (ref 3.87–5.11)
RDW: 12.1 % (ref 11.5–15.5)
WBC: 9 10*3/uL (ref 4.0–10.5)
nRBC: 0 % (ref 0.0–0.2)

## 2018-11-15 LAB — BRAIN NATRIURETIC PEPTIDE: B Natriuretic Peptide: 332.7 pg/mL — ABNORMAL HIGH (ref 0.0–100.0)

## 2018-11-15 MED ORDER — POLYETHYLENE GLYCOL 3350 17 G PO PACK
17.0000 g | PACK | Freq: Two times a day (BID) | ORAL | Status: DC
  Filled 2018-11-15 (×2): qty 1

## 2018-11-15 MED ORDER — FUROSEMIDE 10 MG/ML IJ SOLN
20.0000 mg | Freq: Once | INTRAMUSCULAR | Status: AC
  Administered 2018-11-15: 20 mg via INTRAVENOUS
  Filled 2018-11-15: qty 2

## 2018-11-15 MED ORDER — SENNOSIDES-DOCUSATE SODIUM 8.6-50 MG PO TABS
2.0000 | ORAL_TABLET | Freq: Two times a day (BID) | ORAL | Status: DC
  Filled 2018-11-15 (×3): qty 2

## 2018-11-15 MED ORDER — IOPAMIDOL (ISOVUE-300) INJECTION 61%
100.0000 mL | Freq: Once | INTRAVENOUS | Status: AC | PRN
  Administered 2018-11-15: 100 mL via INTRAVENOUS

## 2018-11-15 NOTE — Progress Notes (Signed)
PROGRESS NOTE  [name] Weber ZOX:[id number] DOB: 1931/02/15 DOA: 11/05/2018 PCP: Marden Noble, MD  HPI/Brief Narrative  [name] Weber is a 84 y.o. year old female with medical history significant for fatigue, malaise, paroxysmal atrial fibrillation (not on anticoagulation) occasional palpitations osteoporosis, anxiety disorder, pulmonary hypertension, and remote pelvic fracture  who presented on 11/05/2018 to the ED after being found wedged between the toilet and her tub.  Upon discussion she states somehow she slid off the toilet denied any preceding signs or symptoms.  She became faint and dizzy upon initial attempt to ambulate in the ED prompting admission for further work-up.  She was found to have abnormal urinalysis with E. coli on urine culture, she does not remember any urinary symptoms but here has had urinary frequency concerning for UTI as well as multiple rib fractures on bilateral lower extremity skin tears in the setting of fall.    Hospital course complicated by usually plan to discharge to skilled nurse facility however patient continued to have symptomatic orthostatic hypotension that resolved with supportive care including compression stockings discontinuation of amlodipine (started for hypertension noted throughout hospital stay). Patient continued to report nonspecific abdominal/pelvis pain.  CT pelvis imaging on 12/13 showed no fractures of the hip or pelvis.  CT abdomen on 12/17 showed large stool burden for which bowel regimen started.  It additionally noted moderately sized pleural effusions.  BNP was obtained which was greater than 300, in the setting of cardiomegaly on previous imaging patient was started on IV Lasix 20 mg x 1 on 12/17.  TTE was also ordered given last echo was obtained 11/2016.  CT also showed questionable gallbladder stranding with concern for possible cholecystitis and patient currently awaiting right upper quadrant ultrasound.  Nursing also noted  some pink-tinged blood?  On patient's changing the pad after bowel movements.  Of note, CT pelvis imaging on 12/13 showed rectal wall thickening that could represent inflammation of the rectum.  Subjective Reports only mild nausea States her abdominal pain is somewhat stable currently Denies any chest pain, shortness of breath, cough   Assessment/Plan:  #Bilateral pleural effusions, cardiomegaly, elevated BNP.  Concerning for CHF flare, no peripheral edema, denies any dyspnea, no oxygen requirements.  Last echo on 11/2016 showed preserved EF with inability to assess diastolic function given atrial fibrillation.  Will give IV Lasix x20 mg (nave).  Daily weights, strict I's and O's.  TTE pending.  #Bilateral hip pain/abdominal pain.  CT imaging shows large stool burden.  Bowel regimen was advanced.  Patient seems to have chronic fecal incontinence so initially during hospital stay her regimen was decreased.  Additionally CT imaging describes some gallbladder stranding, right upper quadrant ultrasound ordered and pending we will continue to monitor, IV antiemetics and pain control as needed.   #Rectal wall thickening. Mentioned on CT pelvis/hips on 12/13.  She has had episodes of not being able to control her bowels/fecal incontinence but this has been ongoing prior to admission per POA.Marland Kitchen  Normal rectal tone on exam and no melena or bleeding on 12/13.  Discontinued stool softeners on 12/13 but reinitiated given large stool burden noted on most recent CT imaging. Given patient also reported to have some bleeding this evening we will continue to monitor CBC.  May warrant GI consultation depending on persistency of symptoms, continue to monitor   #Minimally displaced lower left rib fractures, stable.  Pain control with lidocaine patch (started 12/10), PRN tramadol (1/2 tab per family request), scheduled Toradol completed, scheduled  tylenol.  Supportive care with incentive spirometry  #Orthostatic  hypotension, resolved.  Blood pressure much more improved while off amlodipine (discontinued on 12/15).  No longer orthostatic on vitals checked on 12/15.  Continue to monitor.  Continue compression stockings  #Leukocytosis, resolved.  Has remained afebrile.  No clear site of infection.    #Unwitnessed fall.  Unclear etiology, weird setting of potential mechanical fall.  PT recommends SNF for continued therapy with mobility given balance deficits.  Likely related to orthostatic hypotension  #Hypertension, previously quite high during hospitalization now resolved while off amlodipine.    #E. coli UTI, completed 5 day course of keflex.   Patient unable to report any previous urinary symptoms prior to hospital stay but does report urinary frequency while here  #Bilateral lower extremity skin tears, stable.  Dressings in place.  Continue to monitor.  Wound care consulted to assist with dressing changes instructions for when ready for rehab facility  #Paroxysmal atrial flutter, rate controlled.  Continue home digoxin.  Not on any anticoagulation as outpatient.  Monitor on telemetry .  #Poor nutrition, chronic.  Has  been ongoing since prior to admission.  Patient has intermittent appetite.  Consulted dietitian.  In the meantime scheduled Ensure with all 3 meals  Code Status: Full code  Family Communication: POA, Vi Appleton updated on phone (11/15/2018) Please update as patient definitely has at minimum some mild cognitive impairment  Disposition Plan: Right upper quadrant ultrasound to evaluate potential gallbladder stranding, IV Lasix x20 given pleural effusions, cardiomegaly and elevated BNP, pending TTE, further evaluation of questionable pink tinged/Bleeding episodes and rectal wall thickening if symptoms persist, bowel regimen optimization due to large stool burden which is likely biggest contributor to abdominal pain    Antimicrobials: Anti-infectives (From admission, onward)   Start      Dose/Rate Route Frequency Ordered Stop   11/06/18 2200  cephALEXin (KEFLEX) capsule 500 mg  Status:  Discontinued     500 mg Oral Every 12 hours 11/06/18 1224 11/12/18 1313   11/06/18 0600  cephALEXin (KEFLEX) capsule 500 mg  Status:  Discontinued     500 mg Oral Every 8 hours 11/05/18 1140 11/06/18 1224   11/05/18 1015  cefTRIAXone (ROCEPHIN) 1 g in sodium chloride 0.9 % 100 mL IVPB     1 g 200 mL/hr over 30 Minutes Intravenous  Once 11/05/18 1011 11/05/18 1054         DVT prophylaxis: lovenox   Objective: Vitals:   11/14/18 2232 11/15/18 0509 11/15/18 1022 11/15/18 1324  BP: 134/81 (!) 149/84  127/82  Pulse: 79 62 82 73  Resp: 17     Temp: 98.8 F (37.1 C) 98.7 F (37.1 C)  98.4 F (36.9 C)  TempSrc: Oral Oral  Oral  SpO2: 96% 91%  96%  Weight:      Height:        Intake/Output Summary (Last 24 hours) at 11/15/2018 1947 Last data filed at 11/15/2018 1700 Gross per 24 hour  Intake 240 ml  Output 350 ml  Net -110 ml   Filed Weights   11/09/18 1900  Weight: 54.6 kg    Exam:  Constitutional: Elderly female, no distress Eyes: EOMI, anicteric, normal conjunctivae ENMT: Very hard of hearing, Oropharynx with moist mucous membranes, normal dentition Cardiovascular: RRR no MRGs, with no peripheral edema Respiratory: Normal respiratory effort, clear breath sounds  Abdomen: Soft, no tenderness even with deep palpation, normal bowel sounds, no rebound tenderness or guarding, negative Murphy's sign Skin: Bruising  along lateral side of left rib cage, tenderness to palpation of left rib cage  Neurologic: Grossly no focal neuro deficit.  Able to move lower extremities upon command much improved in comparison to prior exams  Psychiatric:Appropriate affect, and mood. Mental status AAOx3  Data Reviewed: CBC: Recent Labs  Lab 11/09/18 0229 11/12/18 0756 11/13/18 0413 11/14/18 0318 11/15/18 0347  WBC 5.8 13.1* 8.5 8.5 9.0  HGB 12.7 12.7 11.5* 12.4 11.9*  HCT 40.7 39.5  36.7 38.7 37.7  MCV 100.5* 101.0* 102.8* 101.0* 101.6*  PLT 165 184 195 220 256   Basic Metabolic Panel: Recent Labs  Lab 11/09/18 0229 11/12/18 0636 11/12/18 0756 11/13/18 0413 11/14/18 0318 11/15/18 0347  NA 141  --  139 138 141 140  K 4.0  --  3.8 3.8 3.8 3.8  CL 105  --  103 106 106 106  CO2 28  --  27 24 25 26   GLUCOSE 94  --  106* 84 94 96  BUN 20  --  12 17 15 14   CREATININE 0.77 0.60 0.64 0.65 0.73 0.76  CALCIUM 8.8*  --  8.1* 8.0* 8.5* 8.2*   GFR: Estimated Creatinine Clearance: 44.3 mL/min (by C-G formula based on SCr of 0.76 mg/dL). Liver Function Tests: Recent Labs  Lab 11/15/18 0347  AST 16  ALT 19  ALKPHOS 62  BILITOT 0.5  PROT 5.3*  ALBUMIN 2.4*   No results for input(s): LIPASE, AMYLASE in the last 168 hours. No results for input(s): AMMONIA in the last 168 hours. Coagulation Profile: No results for input(s): INR, PROTIME in the last 168 hours. Cardiac Enzymes: No results for input(s): CKTOTAL, CKMB, CKMBINDEX, TROPONINI in the last 168 hours. BNP (last 3 results) No results for input(s): PROBNP in the last 8760 hours. HbA1C: No results for input(s): HGBA1C in the last 72 hours. CBG: No results for input(s): GLUCAP in the last 168 hours. Lipid Profile: No results for input(s): CHOL, HDL, LDLCALC, TRIG, CHOLHDL, LDLDIRECT in the last 72 hours. Thyroid Function Tests: No results for input(s): TSH, T4TOTAL, FREET4, T3FREE, THYROIDAB in the last 72 hours. Anemia Panel: No results for input(s): VITAMINB12, FOLATE, FERRITIN, TIBC, IRON, RETICCTPCT in the last 72 hours. Urine analysis:    Component Value Date/Time   COLORURINE YELLOW 11/05/2018 0939   APPEARANCEUR CLOUDY (A) 11/05/2018 0939   LABSPEC 1.018 11/05/2018 0939   PHURINE 5.0 11/05/2018 0939   GLUCOSEU NEGATIVE 11/05/2018 0939   HGBUR MODERATE (A) 11/05/2018 0939   BILIRUBINUR NEGATIVE 11/05/2018 0939   KETONESUR 5 (A) 11/05/2018 0939   PROTEINUR 30 (A) 11/05/2018 0939   UROBILINOGEN  0.2 01/23/2015 0959   NITRITE POSITIVE (A) 11/05/2018 0939   LEUKOCYTESUR TRACE (A) 11/05/2018 0939   Sepsis Labs: @LABRCNTIP (procalcitonin:4,lacticidven:4)  ) No results found for this or any previous visit (from the past 240 hour(s)).    Studies: Ct Abdomen Pelvis W Contrast  Result Date: 11/15/2018 CLINICAL DATA:  Persistent abdominal pain EXAM: CT ABDOMEN AND PELVIS WITH CONTRAST TECHNIQUE: Multidetector CT imaging of the abdomen and pelvis was performed using the standard protocol following bolus administration of intravenous contrast. CONTRAST:  ISOVUE-300 IOPAMIDOL (ISOVUE-300) INJECTION 61% COMPARISON:  Plain films 11/12/2018.  CT pelvis 11/11/2018. FINDINGS: Lower chest: Moderate bilateral pleural effusions with compressive atelectasis. Heart is mildly enlarged. Hepatobiliary: No suspicious focal hepatic abnormality or ductal dilatation. There is mild indistinctness of the gallbladder wall with slight surrounding stranding/fluid. Can not exclude cholecystitis. No visible stones. Pancreas: No focal abnormality or ductal dilatation.  Spleen: No focal abnormality.  Normal size. Adrenals/Urinary Tract: Left renal cysts, the largest 3.1 cm. No hydronephrosis. Adrenal glands and urinary bladder unremarkable. Stomach/Bowel: Severe sigmoid diverticulosis. Large stool burden throughout the colon. No active diverticulitis. Stomach and small bowel decompressed, unremarkable. Normal appendix. Vascular/Lymphatic: Aortic atherosclerosis. No enlarged abdominal or pelvic lymph nodes. IVC filter in place. Reproductive: Prior hysterectomy.  No adnexal masses. Other: Trace free fluid in the pelvis.  No free air. Musculoskeletal: No acute bony abnormality. IMPRESSION: Indistinctness/haziness of the gallbladder wall with slight surrounding stranding/fluid. Can not exclude cholecystitis. Consider further evaluation with right upper quadrant ultrasound. Extensive sigmoid diverticulosis. No active  diverticulitis. Large stool burden within the colon. Aortic atherosclerosis. Moderate bilateral pleural effusions with compressive atelectasis in the lower lobes. Mild cardiomegaly. Electronically Signed   By: Charlett NoseKevin  Dover M.D.   On: 11/15/2018 10:11    Scheduled Meds: . acetaminophen  1,000 mg Oral TID  . acidophilus  1 capsule Oral Daily  . aspirin EC  81 mg Oral Daily  . digoxin  125 mcg Oral Daily  . enoxaparin (LOVENOX) injection  40 mg Subcutaneous Q24H  . famotidine  10 mg Oral BID  . furosemide  20 mg Intravenous Once  . ketorolac  15 mg Intravenous BID  . latanoprost  1 drop Both Eyes QHS  . lidocaine  1 patch Transdermal Q24H  . polyethylene glycol  17 g Oral BID  . senna-docusate  2 tablet Oral BID  . vitamin B-12  500 mcg Oral Daily    Continuous Infusions:   LOS: 4 days     Laverna PeaceShayla D Ely Spragg, MD [facility] Pager [phone]  If 7PM-7AM, please contact night-coverage [email or web address] Password [email or web address] 11/15/2018, 7:47 PM

## 2018-11-15 NOTE — Progress Notes (Signed)
[id number]: pt transported to CT.  1006: pt returned to 5N08 from CT. Will continue to monitor.

## 2018-11-15 NOTE — Plan of Care (Signed)
  Problem: Education: Goal: Knowledge of General Education information will improve Description: Including pain rating scale, medication(s)/side effects and non-pharmacologic comfort measures Outcome: Progressing   Problem: Health Behavior/Discharge Planning: Goal: Ability to manage health-related needs will improve Outcome: Progressing   Problem: Nutrition: Goal: Adequate nutrition will be maintained Outcome: Progressing   Problem: Coping: Goal: Level of anxiety will decrease Outcome: Progressing   Problem: Safety: Goal: Ability to remain free from injury will improve Outcome: Progressing   Problem: Skin Integrity: Goal: Risk for impaired skin integrity will decrease Outcome: Progressing   

## 2018-11-15 NOTE — Plan of Care (Signed)
  Problem: Safety: Goal: Ability to remain free from injury will improve Outcome: Progressing   

## 2018-11-15 NOTE — Progress Notes (Signed)
When changing bed pad, pink-tinged spots noted on pad. Roberto ScalesShayla Nettey, MD paged. Awaiting MD response. Will continue to monitor.

## 2018-11-15 NOTE — Care Management Important Message (Signed)
Important Message  Patient Details  Name: [name] Weber MRN: [id number] Birth: 11/25/1931   Medicare Important Message Given:  Yes    [name] Weber 11/15/2018, 4:13 PM

## 2018-11-15 NOTE — Progress Notes (Signed)
Occupational Therapy Treatment Patient Details Name: [name] Weber MRN: [id number] DOB: 1931-04-10 Today's Date: 11/15/2018    History of present illness Pt is an 84 y/o female with a past medical history of Fatigue, Malaise, Myalgia, PAC (premature atrial contraction), Palpitations, Paroxysmal atrial flutter, and PVC's (premature ventricular contractions). Pt sustained a fall at home and was wedged between tub and toilet. Pt became faint and dizzy with initial attempts to ambulate in ED, admitted for further workup. Imaging revealed multiple left rib fractures of the lower ribs.  No pneumothorax.    OT comments  Pt progressing towards established OT goals. Pt performing functional mobility to sink with Min Guard A and RW to perform grooming. Pt performing grooming while seated at sink with set up and supervision. Pt with bowel incontinence and required Max A for toilet hygiene. Pt continues to present with decreased activity tolerance as seen as fatigue and required rest breaks. Continue to recommend dc to SNF and will continue to follow acutely as admitted.    Follow Up Recommendations  SNF;Supervision/Assistance - 24 hour    Equipment Recommendations  None recommended by OT    Recommendations for Other Services      Precautions / Restrictions Precautions Precautions: Fall Precaution Comments: L rib fxs Restrictions Weight Bearing Restrictions: No       Mobility Bed Mobility Overal bed mobility: Needs Assistance Bed Mobility: Rolling;Sit to Sidelying Rolling: Min guard       Sit to sidelying: Min guard General bed mobility comments: Min Guard A for safety.  Transfers Overall transfer level: Needs assistance Equipment used: Rolling walker (2 wheeled) Transfers: Sit to/from Stand Sit to Stand: Min guard         General transfer comment: Min Guard A for safety    Balance Overall balance assessment: Needs assistance Sitting-balance support: Feet  supported Sitting balance-Leahy Scale: Good     Standing balance support: Bilateral upper extremity supported;During functional activity Standing balance-Leahy Scale: Poor Standing balance comment: Requiring on UE for dynamic support                           ADL either performed or assessed with clinical judgement   ADL Overall ADL's : Needs assistance/impaired     Grooming: Set up;Supervision/safety;Sitting;Oral care;Brushing hair;Wash/dry face Grooming Details (indicate cue type and reason): supervision at sink while seated.                  Toilet Transfer: Min guard;Ambulation;BSC;RW Toilet Transfer Details (indicate cue type and reason): Min Guard A for safety and cues for hand placement.  Toileting- Clothing Manipulation and Hygiene: Maximal assistance;Sit to/from stand Toileting - Clothing Manipulation Details (indicate cue type and reason): Pt with stool incontience and required Max A for toilet hygiene.     Functional mobility during ADLs: Min guard;Rolling walker General ADL Comments: Pt performing functional mobility to sink for grooming. Pt with stool incontience and required Max A for toilet hygiene. Pt fatiguing quickly     Vision       Perception     Praxis      Cognition Arousal/Alertness: Awake/alert Behavior During Therapy: WFL for tasks assessed/performed Overall Cognitive Status: Within Functional Limits for tasks assessed                                          Exercises  Shoulder Instructions       General Comments Pt's sister present throughout. BP 140/80 during activity    Pertinent Vitals/ Pain       Pain Assessment: 0-10 Pain Score: 5  Pain Location: bilat LE/feet Pain Descriptors / Indicators: Discomfort;Sore;Guarding Pain Intervention(s): Monitored during session;Limited activity within patient's tolerance;Repositioned  Home Living                                           Prior Functioning/Environment              Frequency  Min 2X/week        Progress Toward Goals  OT Goals(current goals can now be found in the care plan section)  Progress towards OT goals: Progressing toward goals  Acute Rehab OT Goals Patient Stated Goal: to go home to her dog OT Goal Formulation: With patient Time For Goal Achievement: 11/20/18 Potential to Achieve Goals: Good ADL Goals Pt Will Perform Grooming: with modified independence;standing Pt Will Perform Lower Body Bathing: with modified independence;sit to/from stand Pt Will Perform Upper Body Dressing: with modified independence;sitting Pt Will Perform Lower Body Dressing: with modified independence;sit to/from stand Pt Will Transfer to Toilet: with modified independence;ambulating;regular height toilet Pt Will Perform Toileting - Clothing Manipulation and hygiene: with modified independence;sit to/from stand Additional ADL Goal #1: Pt will perform bed mobility at mod independent level as precursor to EOB/OOB ADL.  Plan Discharge plan remains appropriate    Co-evaluation                 AM-PAC OT "6 Clicks" Daily Activity     Outcome Measure   Help from another person eating meals?: None Help from another person taking care of personal grooming?: A Little Help from another person toileting, which includes using toliet, bedpan, or urinal?: A Lot Help from another person bathing (including washing, rinsing, drying)?: A Little Help from another person to put on and taking off regular upper body clothing?: None Help from another person to put on and taking off regular lower body clothing?: A Little 6 Click Score: 19    End of Session Equipment Utilized During Treatment: Rolling walker  OT Visit Diagnosis: Unsteadiness on feet (R26.81);Pain Pain - Right/Left: Left Pain - part of body: (ribs)   Activity Tolerance Patient tolerated treatment well;Patient limited by fatigue   Patient Left with  call bell/phone within reach;in bed;with bed alarm set;with family/visitor present   Nurse Communication Other (comment);Mobility status(bowel incontenince)        Time: [id number] OT Time Calculation (min): 28 min  Charges: OT General Charges $OT Visit: 1 Visit OT Treatments $Self Care/Home Management : 23-37 mins  Raphael Fitzpatrick MSOT, OTR/L Acute Rehab Pager: [phone] Office: [phone]   Theodoro GristCharis M Misti Towle 11/15/2018, 4:33 PM

## 2018-11-16 ENCOUNTER — Inpatient Hospital Stay (HOSPITAL_COMMUNITY): Payer: Medicare Other

## 2018-11-16 DIAGNOSIS — I370 Nonrheumatic pulmonary valve stenosis: Secondary | ICD-10-CM

## 2018-11-16 LAB — CBC
HCT: 36.9 % (ref 36.0–46.0)
Hemoglobin: 11.9 g/dL — ABNORMAL LOW (ref 12.0–15.0)
MCH: 32.3 pg (ref 26.0–34.0)
MCHC: 32.2 g/dL (ref 30.0–36.0)
MCV: 100.3 fL — ABNORMAL HIGH (ref 80.0–100.0)
Platelets: 251 10*3/uL (ref 150–400)
RBC: 3.68 MIL/uL — ABNORMAL LOW (ref 3.87–5.11)
RDW: 12 % (ref 11.5–15.5)
WBC: 8.6 10*3/uL (ref 4.0–10.5)
nRBC: 0 % (ref 0.0–0.2)

## 2018-11-16 LAB — ECHOCARDIOGRAM COMPLETE
Height: 68 in
Weight: 1953 oz

## 2018-11-16 LAB — BASIC METABOLIC PANEL
Anion gap: 7 (ref 5–15)
BUN: 12 mg/dL (ref 8–23)
CO2: 26 mmol/L (ref 22–32)
Calcium: 8.2 mg/dL — ABNORMAL LOW (ref 8.9–10.3)
Chloride: 107 mmol/L (ref 98–111)
Creatinine, Ser: 0.7 mg/dL (ref 0.44–1.00)
GFR calc Af Amer: >60 mL/min (ref >60)
GFR calc non Af Amer: >60 mL/min (ref >60)
Glucose, Bld: 93 mg/dL (ref 70–99)
Potassium: 3.6 mmol/L (ref 3.5–5.1)
Sodium: 140 mmol/L (ref 135–145)

## 2018-11-16 MED ORDER — SENNOSIDES-DOCUSATE SODIUM 8.6-50 MG PO TABS: 2.0000 | ORAL_TABLET | Freq: Every evening | ORAL | Status: DC | PRN

## 2018-11-16 MED ORDER — GADOBUTROL 1 MMOL/ML IV SOLN
5.5000 mL | Freq: Once | INTRAVENOUS | Status: AC | PRN
  Administered 2018-11-16: 5.5 mL via INTRAVENOUS

## 2018-11-16 MED ORDER — POLYETHYLENE GLYCOL 3350 17 G PO PACK: 17.0000 g | PACK | Freq: Every day | ORAL | Status: DC | PRN

## 2018-11-16 NOTE — Progress Notes (Signed)
CSW consulted patient in room who reports POA Vi has left for the day. CSW reported to patient that CSW called POA Vi at 10:45 this morning and left a voicemail, however has not had a return call. Patient asked if CSW was calling right number, CSW and patient confirmed that the right number was called. CSW asked patient to let Vi know she needs to return the call to discuss SNF.   CSW attempted call again this afternoon at 2:30 pm. Phone went to voicemail again, CSW left second message for the day requesting POA Vi return the call to discuss SNF facilities.   CSW will continue to attempt to connect with patient's POA.   ChaseAshley Leotha Westermeyer, [place] [phone]

## 2018-11-16 NOTE — Progress Notes (Signed)
PROGRESS NOTE    Stephani Policevelyn C Caporale  ZOX:[id number] DOB: 06/17/1931 DOA: 11/05/2018 PCP: Marden NobleGates, Robert, MD    Brief Narrative:  84 y.o. year old female with medical history significant for fatigue, malaise, paroxysmal atrial fibrillation (not on anticoagulation) occasional palpitations osteoporosis, anxiety disorder, pulmonary hypertension, and remote pelvic fracture  who presented on 11/05/2018 to the ED after being found wedged between the toilet and her tub.  Upon discussion she states somehow she slid off the toilet denied any preceding signs or symptoms.  She became faint and dizzy upon initial attempt to ambulate in the ED prompting admission for further work-up.  She was found to have abnormal urinalysis with E. coli on urine culture, she does not remember any urinary symptoms but here has had urinary frequency concerning for UTI as well as multiple rib fractures on bilateral lower extremity skin tears in the setting of fall.    Hospital course complicated by usually plan to discharge to skilled nurse facility however patient continued to have symptomatic orthostatic hypotension that resolved with supportive care including compression stockings discontinuation of amlodipine (started for hypertension noted throughout hospital stay). Patient continued to report nonspecific abdominal/pelvis pain.  CT pelvis imaging on 12/13 showed no fractures of the hip or pelvis.  CT abdomen on 12/17 showed large stool burden for which bowel regimen started.  It additionally noted moderately sized pleural effusions.  BNP was obtained which was greater than 300, in the setting of cardiomegaly on previous imaging patient was started on IV Lasix 20 mg x 1 on 12/17.  TTE was also ordered given last echo was obtained 11/2016.  CT also showed questionable gallbladder stranding with concern for possible cholecystitis and patient currently awaiting right upper quadrant ultrasound.  Nursing also noted some pink-tinged  blood?  On patient's changing the pad after bowel movements.  Of note, CT pelvis imaging on 12/13 showed rectal wall thickening that could represent inflammation of the rectum.   Assessment & Plan:   Principal Problem:   Multiple rib fractures Active Problems:   Benign hypertensive heart disease without heart failure   HLD (hyperlipidemia)   Weakness of both legs   Near syncope   Chronic constipation   A-fib (HCC)   Osteoporosis with current pathological fracture   Chronic midline low back pain without sciatica   Cystitis   B12 deficiency   Rib fracture   Orthostatic hypotension   Rectal abnormality   Leukocytosis   Abdominal pain   Skin tear of lower leg without complication, left, initial encounter   Skin tear of lower leg without complication, right, initial encounter   Malnutrition of moderate degree   Bilateral pleural effusion   Elevated brain natriuretic peptide (BNP) level   Cardiomegaly  #Bilateral pleural effusions, cardiomegaly, elevated BNP.  Concerning for CHF flare, no peripheral edema, denies any dyspnea, no oxygen requirements. Most recent echo on 11/2016 showed preserved EF with inability to assess diastolic function given atrial fibrillation.  2d echo repeated, normal LVEF  #Bilateral hip pain/abdominal pain.  CT imaging shows large stool burden.  Bowel regimen was advanced.  Additionally CT imaging describes some gallbladder stranding, right upper quadrant ultrasound with findings of possible CBD dilation and possible cholecystitis. Have ordered follow up MRCP  #Rectal wall thickening. Mentioned on CT pelvis/hips on 12/13.  She has had episodes of not being able to control her bowels/fecal incontinence but this has been ongoing prior to admission per POA..  Noted to have normal rectal tone on exam and  no melena or bleeding on 12/13.  #Minimally displaced lower left rib fractures, stable.  Pain control with lidocaine patch (started 12/10), PRN tramadol (1/2  tab per family request), scheduled Toradol completed, scheduled tylenol.  Supportive care with incentive spirometry. Stable at this time  #Orthostatic hypotension, resolved.  Blood pressure much more improved while off amlodipine (discontinued on 12/15).  No longer orthostatic on vitals checked on 12/15.  Continue to monitor.  Continue compression stockings. Seems stable at present  #Leukocytosis, resolved.  Has remained afebrile.  No clear site of infection.    #Unwitnessed fall.  Unclear etiology, weird setting of potential mechanical fall.  PT recommends SNF for continued therapy with mobility given balance deficits.  Likely related to orthostatic hypotension. Pending SNF placement  #Hypertension, previously quite high during hospitalization now resolved while off amlodipine.    #E. coli UTI, completed 5 day course of keflex.   Patient unable to report any previous urinary symptoms prior to hospital stay but does report urinary frequency while here. Presently stable  #Bilateral lower extremity skin tears, stable.  Dressings in place.  Continue to monitor.  Wound care consulted to assist with dressing changes instructions for when ready for rehab facility  #Paroxysmal atrial flutter, rate controlled.  Continue home digoxin.  Not on any anticoagulation as outpatient. Presently stable  #Poor nutrition, chronic.  Has  been ongoing since prior to admission.  Patient has intermittent appetite, likely resulting in presenting orthostatic hypotension.  Consulted dietitian.  In the meantime scheduled Ensure with all 3 meals    DVT prophylaxis: Lovenox subQ Code Status: Full Family Communication: Pt in room, family at bedside Disposition Plan: SNF possible in 24-48hrs  Consultants:     Procedures:     Antimicrobials: Anti-infectives (From admission, onward)   Start     Dose/Rate Route Frequency Ordered Stop   11/06/18 2200  cephALEXin (KEFLEX) capsule 500 mg  Status:  Discontinued      500 mg Oral Every 12 hours 11/06/18 1224 11/12/18 1313   11/06/18 0600  cephALEXin (KEFLEX) capsule 500 mg  Status:  Discontinued     500 mg Oral Every 8 hours 11/05/18 1140 11/06/18 1224   11/05/18 1015  cefTRIAXone (ROCEPHIN) 1 g in sodium chloride 0.9 % 100 mL IVPB     1 g 200 mL/hr over 30 Minutes Intravenous  Once 11/05/18 1011 11/05/18 1054       Subjective: Complaining of crampy lower abd discomfort  Objective: Vitals:   11/16/18 1100 11/16/18 1200 11/16/18 1203 11/16/18 1320  BP:  (!) 144/80 (!) 157/87 (!) 118/59  Pulse:  76 85 85  Resp:    18  Temp:    98.3 F (36.8 C)  TempSrc:    Oral  SpO2:  96% 97% 96%  Weight: 54.6 kg     Height:        Intake/Output Summary (Last 24 hours) at 11/16/2018 1642 Last data filed at 11/16/2018 1200 Gross per 24 hour  Intake 480 ml  Output 302 ml  Net 178 ml   Filed Weights   11/09/18 1900 11/16/18 0425 11/16/18 1100  Weight: 54.6 kg 55.4 kg 54.6 kg    Examination:  General exam: Appears calm and comfortable  Respiratory system: Clear to auscultation. Respiratory effort normal. Cardiovascular system: S1 & S2 heard, RRR Gastrointestinal system: Abdomen is nondistended, pos BS, generally tender Central nervous system: Alert and oriented. No focal neurological deficits. Extremities: Symmetric 5 x 5 power. Skin: No rashes, lesions Psychiatry: Judgement  and insight appear normal. Mood & affect appropriate.   Data Reviewed: I have personally reviewed following labs and imaging studies  CBC: Recent Labs  Lab 11/12/18 0756 11/13/18 0413 11/14/18 0318 11/15/18 0347 11/16/18 0215  WBC 13.1* 8.5 8.5 9.0 8.6  HGB 12.7 11.5* 12.4 11.9* 11.9*  HCT 39.5 36.7 38.7 37.7 36.9  MCV 101.0* 102.8* 101.0* 101.6* 100.3*  PLT 184 195 220 256 251   Basic Metabolic Panel: Recent Labs  Lab 11/12/18 0756 11/13/18 0413 11/14/18 0318 11/15/18 0347 11/16/18 0215  NA 139 138 141 140 140  K 3.8 3.8 3.8 3.8 3.6  CL 103 106 106  106 107  CO2 27 24 25 26 26   GLUCOSE 106* 84 94 96 93  BUN 12 17 15 14 12   CREATININE 0.64 0.65 0.73 0.76 0.70  CALCIUM 8.1* 8.0* 8.5* 8.2* 8.2*   GFR: Estimated Creatinine Clearance: 44.3 mL/min (by C-G formula based on SCr of 0.7 mg/dL). Liver Function Tests: Recent Labs  Lab 11/15/18 0347  AST 16  ALT 19  ALKPHOS 62  BILITOT 0.5  PROT 5.3*  ALBUMIN 2.4*   No results for input(s): LIPASE, AMYLASE in the last 168 hours. No results for input(s): AMMONIA in the last 168 hours. Coagulation Profile: No results for input(s): INR, PROTIME in the last 168 hours. Cardiac Enzymes: No results for input(s): CKTOTAL, CKMB, CKMBINDEX, TROPONINI in the last 168 hours. BNP (last 3 results) No results for input(s): PROBNP in the last 8760 hours. HbA1C: No results for input(s): HGBA1C in the last 72 hours. CBG: No results for input(s): GLUCAP in the last 168 hours. Lipid Profile: No results for input(s): CHOL, HDL, LDLCALC, TRIG, CHOLHDL, LDLDIRECT in the last 72 hours. Thyroid Function Tests: No results for input(s): TSH, T4TOTAL, FREET4, T3FREE, THYROIDAB in the last 72 hours. Anemia Panel: No results for input(s): VITAMINB12, FOLATE, FERRITIN, TIBC, IRON, RETICCTPCT in the last 72 hours. Sepsis Labs: No results for input(s): PROCALCITON, LATICACIDVEN in the last 168 hours.  No results found for this or any previous visit (from the past 240 hour(s)).   Radiology Studies: Ct Abdomen Pelvis W Contrast  Result Date: 11/15/2018 CLINICAL DATA:  Persistent abdominal pain EXAM: CT ABDOMEN AND PELVIS WITH CONTRAST TECHNIQUE: Multidetector CT imaging of the abdomen and pelvis was performed using the standard protocol following bolus administration of intravenous contrast. CONTRAST:  ISOVUE-300 IOPAMIDOL (ISOVUE-300) INJECTION 61% COMPARISON:  Plain films 11/12/2018.  CT pelvis 11/11/2018. FINDINGS: Lower chest: Moderate bilateral pleural effusions with compressive atelectasis. Heart is  mildly enlarged. Hepatobiliary: No suspicious focal hepatic abnormality or ductal dilatation. There is mild indistinctness of the gallbladder wall with slight surrounding stranding/fluid. Can not exclude cholecystitis. No visible stones. Pancreas: No focal abnormality or ductal dilatation. Spleen: No focal abnormality.  Normal size. Adrenals/Urinary Tract: Left renal cysts, the largest 3.1 cm. No hydronephrosis. Adrenal glands and urinary bladder unremarkable. Stomach/Bowel: Severe sigmoid diverticulosis. Large stool burden throughout the colon. No active diverticulitis. Stomach and small bowel decompressed, unremarkable. Normal appendix. Vascular/Lymphatic: Aortic atherosclerosis. No enlarged abdominal or pelvic lymph nodes. IVC filter in place. Reproductive: Prior hysterectomy.  No adnexal masses. Other: Trace free fluid in the pelvis.  No free air. Musculoskeletal: No acute bony abnormality. IMPRESSION: Indistinctness/haziness of the gallbladder wall with slight surrounding stranding/fluid. Can not exclude cholecystitis. Consider further evaluation with right upper quadrant ultrasound. Extensive sigmoid diverticulosis. No active diverticulitis. Large stool burden within the colon. Aortic atherosclerosis. Moderate bilateral pleural effusions with compressive atelectasis in the lower  lobes. Mild cardiomegaly. Electronically Signed   By: Charlett Nose M.D.   On: 11/15/2018 10:11   US Abdomen Limited Ruq  Result Date: 11/16/2018 CLINICAL DATA:  Abdominal pain. Possible pericholecystic stranding on CT. EXAM: ULTRASOUND ABDOMEN LIMITED RIGHT UPPER QUADRANT COMPARISON:  Abdominopelvic CT earlier this day. FINDINGS: Technically challenging exam due to patient condition and inability to breath hold. Gallbladder: Distended with wall thickening at 6 mm. No evident gallstone. No pericholecystic fluid. A positive sonographic Eulah Pont sign is noted by sonographer. Common bile duct: Diameter: Dilated at 10 mm, no visualized  choledocholithiasis. Liver: No focal lesion identified. Mildly heterogeneous in parenchymal echogenicity. Portal vein is patent on color Doppler imaging with normal direction of blood flow towards the liver. Right pleural effusion is noted. IMPRESSION: 1. Distended gallbladder and common bile duct dilatation. Gallbladder wall thickening without gallstones. Imaging findings are suspicious for common bile duct obstruction, however patient has normal LFTs. While the test of choice for further evaluation would be MRCP, this would be of limited value in this elderly patient who is unable to breath hold. Consider further evaluation with ERCP. 2. Positive sonographic Murphy sign, which in the setting of gallbladder distention is suspicious for acute acalculous cholecystitis. Electronically Signed   By: Narda Rutherford M.D.   On: 11/16/2018 02:07    Scheduled Meds: . acetaminophen  1,000 mg Oral TID  . acidophilus  1 capsule Oral Daily  . aspirin EC  81 mg Oral Daily  . digoxin  125 mcg Oral Daily  . enoxaparin (LOVENOX) injection  40 mg Subcutaneous Q24H  . famotidine  10 mg Oral BID  . latanoprost  1 drop Both Eyes QHS  . lidocaine  1 patch Transdermal Q24H  . vitamin B-12  500 mcg Oral Daily   Continuous Infusions:   LOS: 5 days   Rickey Barbara, MD [facility] Pager On Amion  If 7PM-7AM, please contact night-coverage 11/16/2018, 4:42 PM

## 2018-11-16 NOTE — Progress Notes (Signed)
CSW called POA Vie to discuss SNF placement options [facility] work[facility]. Insurance authorization will need to be re done as well. No answer from POA, CSW left voicemail.   Will continue to attempt to contact POA.   BarnumAshley Darl Kuss, [place] [phone]

## 2018-11-16 NOTE — Progress Notes (Signed)
Physical Therapy Treatment Patient Details Name: [name] Weber MRN: [id number] DOB: 05/28/1931 Today's Date: 11/16/2018    History of Present Illness Pt is an 84 y/o female with a past medical history of Fatigue, Malaise, Myalgia, PAC (premature atrial contraction), Palpitations, Paroxysmal atrial flutter, and PVC's (premature ventricular contractions). Pt sustained a fall at home and was wedged between tub and toilet. Pt became faint and dizzy with initial attempts to ambulate in ED, admitted for further workup. Imaging revealed multiple left rib fractures of the lower ribs.  No pneumothorax.     PT Comments    Patient seen for mobility progression. Pt is eager to participate in therapy and return to PLOF. Pt tolerated activity, including gait and exercises, well however limited due to bowel urgency and frequent stools during session.  Continue to progress as tolerated.   Follow Up Recommendations  SNF;Supervision/Assistance - 24 hour     Equipment Recommendations  None recommended by PT    Recommendations for Other Services OT consult(as ordered)     Precautions / Restrictions Precautions Precautions: Fall Precaution Comments: L rib fxs    Mobility  Bed Mobility               General bed mobility comments: pt OOB in chair upon arrival  Transfers Overall transfer level: Needs assistance Equipment used: Rolling walker (2 wheeled) Transfers: Sit to/from [employer] CorporationStand;Stand Pivot Transfers Sit to Stand: Min assist Stand pivot transfers: Min assist       General transfer comment: assist to power up into standing and for balance in standing and when pivoting; cues for safe hand placement initially; pt stood 5-6 times during session   Ambulation/Gait Ambulation/Gait assistance: Min assist Gait Distance (Feet): 75 Feet Assistive device: Rolling walker (2 wheeled) Gait Pattern/deviations: Step-through pattern;Decreased stride length;Trunk flexed;Decreased dorsiflexion -  right;Decreased dorsiflexion - left(steppage gait on R-denies pain in R LE while ambulating ) Gait velocity: decreased   General Gait Details: assist for balance; cues for proximity to RW; distance limited by bowel urgency   Stairs             Wheelchair Mobility    Modified Rankin (Stroke Patients Only)       Balance Overall balance assessment: Needs assistance Sitting-balance support: Feet supported Sitting balance-Leahy Scale: Good     Standing balance support: Bilateral upper extremity supported;During functional activity Standing balance-Leahy Scale: Poor                              Cognition Arousal/Alertness: Awake/alert Behavior During Therapy: WFL for tasks assessed/performed Overall Cognitive Status: No family/caregiver present to determine baseline cognitive functioning                                 General Comments: HOH; pt unable to recall if she had an ultrasound today or if it is supposed to happen tomorrow      Exercises General Exercises - Lower Extremity Ankle Circles/Pumps: AROM;Both Long Arc Quad: AROM;Strengthening;Both;Seated Hip Flexion/Marching: AROM;Strengthening;Both;Seated    General Comments        Pertinent Vitals/Pain Pain Assessment: Faces Faces Pain Scale: Hurts a little bit Pain Location: R knee  Pain Descriptors / Indicators: Discomfort(decreased sensation) Pain Intervention(s): Limited activity within patient's tolerance;Monitored during session    Home Living  Prior Function            PT Goals (current goals can now be found in the care plan section) Acute Rehab PT Goals Patient Stated Goal: to go home to her dog Progress towards PT goals: Progressing toward goals    Frequency    Min 3X/week      PT Plan Current plan remains appropriate    Co-evaluation              AM-PAC PT "6 Clicks" Mobility   Outcome Measure  Help needed turning  from your back to your side while in a flat bed without using bedrails?: A Little Help needed moving from lying on your back to sitting on the side of a flat bed without using bedrails?: A Lot Help needed moving to and from a bed to a chair (including a wheelchair)?: A Little Help needed standing up from a chair using your arms (e.g., wheelchair or bedside chair)?: A Little Help needed to walk in hospital room?: A Little Help needed climbing 3-5 steps with a railing? : A Lot 6 Click Score: 16    End of Session Equipment Utilized During Treatment: Gait belt Activity Tolerance: Patient tolerated treatment well Patient left: in chair;with call bell/phone within reach Nurse Communication: Mobility status PT Visit Diagnosis: Unsteadiness on feet (R26.81);Other abnormalities of gait and mobility (R26.89);Muscle weakness (generalized) (M62.81);History of falling (Z91.81)     Time: 3086-5784 PT Time Calculation (min) (ACUTE ONLY): 30 min  Charges:  $Gait Training: 8-22 mins $Therapeutic Activity: 8-22 mins                     [name] Weber, PTA Acute Rehabilitation Services Pager: [phone] Office: [phone]     [name] Weber 11/16/2018, 2:30 PM

## 2018-11-17 LAB — COMPREHENSIVE METABOLIC PANEL
ALT: 21 U/L (ref 0–44)
AST: 19 U/L (ref 15–41)
Albumin: 2.5 g/dL — ABNORMAL LOW (ref 3.5–5.0)
Alkaline Phosphatase: 66 U/L (ref 38–126)
Anion gap: 8 (ref 5–15)
BUN: 13 mg/dL (ref 8–23)
CO2: 27 mmol/L (ref 22–32)
Calcium: 8.4 mg/dL — ABNORMAL LOW (ref 8.9–10.3)
Chloride: 105 mmol/L (ref 98–111)
Creatinine, Ser: 0.65 mg/dL (ref 0.44–1.00)
GFR calc Af Amer: >60 mL/min (ref >60)
GFR calc non Af Amer: >60 mL/min (ref >60)
Glucose, Bld: 97 mg/dL (ref 70–99)
Potassium: 4.2 mmol/L (ref 3.5–5.1)
Sodium: 140 mmol/L (ref 135–145)
Total Bilirubin: 0.5 mg/dL (ref 0.3–1.2)
Total Protein: 5.3 g/dL — ABNORMAL LOW (ref 6.5–8.1)

## 2018-11-17 LAB — CBC
HCT: 36.3 % (ref 36.0–46.0)
Hemoglobin: 12 g/dL (ref 12.0–15.0)
MCH: 33 pg (ref 26.0–34.0)
MCHC: 33.1 g/dL (ref 30.0–36.0)
MCV: 99.7 fL (ref 80.0–100.0)
Platelets: 288 10*3/uL (ref 150–400)
RBC: 3.64 MIL/uL — ABNORMAL LOW (ref 3.87–5.11)
RDW: 12 % (ref 11.5–15.5)
WBC: 7.8 10*3/uL (ref 4.0–10.5)
nRBC: 0 % (ref 0.0–0.2)

## 2018-11-17 MED ORDER — HYDROXYZINE HCL 10 MG PO TABS
10.0000 mg | ORAL_TABLET | Freq: Three times a day (TID) | ORAL | Status: DC | PRN
  Filled 2018-11-17: qty 1

## 2018-11-17 MED ORDER — BISMUTH SUBSALICYLATE 262 MG/15ML PO SUSP
30.0000 mL | ORAL | Status: DC | PRN
  Filled 2018-11-17: qty 236

## 2018-11-17 NOTE — Progress Notes (Signed)
Occupational Therapy Treatment Patient Details Name: [name] Weber MRN: [id number] DOB: 03/31/1931 Today's Date: 11/17/2018    History of present illness Pt is an 84 y/o female with a past medical history of Fatigue, Malaise, Myalgia, PAC (premature atrial contraction), Palpitations, Paroxysmal atrial flutter, and PVC's (premature ventricular contractions). Pt sustained a fall at home and was wedged between tub and toilet. Pt became faint and dizzy with initial attempts to ambulate in ED, admitted for further workup. Imaging revealed multiple left rib fractures of the lower ribs, CT pelvis negative, CT abdomen on 12/17 showed large stool burden for which bowel regimen started. CT also showed questionable gallbladder stranding with concern for possible cholecystitis    OT comments  This 84 yo female admitted with above presents to acute OT today making progress with grooming, tranfers, and LBD. She will continue to benefit from acute OT with follow up OT at SNF to work towards a Mod I level.   Follow Up Recommendations  SNF;Supervision/Assistance - 24 hour    Equipment Recommendations  Other (comment)(TBD at next venue)       Precautions / Restrictions Precautions Precautions: Fall Precaution Comments: L rib fxs Restrictions Weight Bearing Restrictions: No       Mobility Bed Mobility Overal bed mobility: Needs Assistance Bed Mobility: Supine to Sit     Supine to sit: Min guard;HOB elevated     General bed mobility comments: coming out of bed on right side; use of rail  Transfers Overall transfer level: Needs assistance Equipment used: Rolling walker (2 wheeled) Transfers: Sit to/from Stand Sit to Stand: Min guard              Balance Overall balance assessment: Needs assistance Sitting-balance support: No upper extremity supported;Feet supported Sitting balance-Leahy Scale: Good     Standing balance support: Single extremity supported;During functional  activity Standing balance-Leahy Scale: Poor                             ADL either performed or assessed with clinical judgement   ADL Overall ADL's : Needs assistance/impaired Eating/Feeding: Independent;Sitting Eating/Feeding Details (indicate cue type and reason): in recliner Grooming: Min guard;Standing;Wash/dry face;Oral care               Lower Body Dressing: Min guard;Sit to/from stand   Toilet Transfer: Min guard;Ambulation;RW Toilet Transfer Details (indicate cue type and reason): bed>around to recliner                 Vision Patient Visual Report: No change from baseline            Cognition Arousal/Alertness: Awake/alert Behavior During Therapy: WFL for tasks assessed/performed Overall Cognitive Status: No family/caregiver present to determine baseline cognitive functioning                                 General Comments: HOH                   Pertinent Vitals/ Pain       Pain Assessment: No/denies pain         Frequency  Min 2X/week        Progress Toward Goals  OT Goals(current goals can now be found in the care plan section)  Progress towards OT goals: Progressing toward goals     Plan Discharge plan remains appropriate  AM-PAC OT "6 Clicks" Daily Activity     Outcome Measure   Help from another person eating meals?: None Help from another person taking care of personal grooming?: A Little Help from another person toileting, which includes using toliet, bedpan, or urinal?: A Little Help from another person bathing (including washing, rinsing, drying)?: A Little Help from another person to put on and taking off regular upper body clothing?: A Little Help from another person to put on and taking off regular lower body clothing?: A Little 6 Click Score: 19    End of Session Equipment Utilized During Treatment: Rolling walker;Gait belt  OT Visit Diagnosis: Unsteadiness on feet  (R26.81);Muscle weakness (generalized) (M62.81)   Activity Tolerance Other (comment)(limited by not feeling well and nausea)   Patient Left in chair;with call bell/phone within reach;with chair alarm set   Nurse Communication (pt reporting she is nauseated)        Time: [id number] OT Time Calculation (min): 19 min  Charges: OT General Charges $OT Visit: 1 Visit OT Treatments $Self Care/Home Management : 8-22 mins  Ignacia Palmaathy Roberta Angell, OTR/L [facility] Pager [phone] Office [phone]      Evette GeorgesLeonard, Taleeya Blondin Eva 11/17/2018, 9:10 AM

## 2018-11-17 NOTE — Progress Notes (Signed)
PROGRESS NOTE    JNYAH BRAZEE  ZOX:[id number] DOB: 06-Apr-1931 DOA: 11/05/2018 PCP: Marden Noble, MD    Brief Narrative:  84 y.o. year old female with medical history significant for fatigue, malaise, paroxysmal atrial fibrillation (not on anticoagulation) occasional palpitations osteoporosis, anxiety disorder, pulmonary hypertension, and remote pelvic fracture  who presented on 11/05/2018 to the ED after being found wedged between the toilet and her tub.  Upon discussion she states somehow she slid off the toilet denied any preceding signs or symptoms.  She became faint and dizzy upon initial attempt to ambulate in the ED prompting admission for further work-up.  She was found to have abnormal urinalysis with E. coli on urine culture, she does not remember any urinary symptoms but here has had urinary frequency concerning for UTI as well as multiple rib fractures on bilateral lower extremity skin tears in the setting of fall.    Hospital course complicated by usually plan to discharge to skilled nurse facility however patient continued to have symptomatic orthostatic hypotension that resolved with supportive care including compression stockings discontinuation of amlodipine (started for hypertension noted throughout hospital stay). Patient continued to report nonspecific abdominal/pelvis pain.  CT pelvis imaging on 12/13 showed no fractures of the hip or pelvis.  CT abdomen on 12/17 showed large stool burden for which bowel regimen started.  It additionally noted moderately sized pleural effusions.  BNP was obtained which was greater than 300, in the setting of cardiomegaly on previous imaging patient was started on IV Lasix 20 mg x 1 on 12/17.  TTE was also ordered given last echo was obtained 11/2016.  CT also showed questionable gallbladder stranding with concern for possible cholecystitis and patient currently awaiting right upper quadrant ultrasound.  Nursing also noted some pink-tinged  blood?  On patient's changing the pad after bowel movements.  Of note, CT pelvis imaging on 12/13 showed rectal wall thickening that could represent inflammation of the rectum.   Assessment & Plan:   Principal Problem:   Multiple rib fractures Active Problems:   Benign hypertensive heart disease without heart failure   HLD (hyperlipidemia)   Weakness of both legs   Near syncope   Chronic constipation   A-fib (HCC)   Osteoporosis with current pathological fracture   Chronic midline low back pain without sciatica   Cystitis   B12 deficiency   Rib fracture   Orthostatic hypotension   Rectal abnormality   Leukocytosis   Abdominal pain   Skin tear of lower leg without complication, left, initial encounter   Skin tear of lower leg without complication, right, initial encounter   Malnutrition of moderate degree   Bilateral pleural effusion   Elevated brain natriuretic peptide (BNP) level   Cardiomegaly  #Bilateral pleural effusions, cardiomegaly, elevated BNP.  Concerning for CHF flare, no peripheral edema, denies any dyspnea, no oxygen requirements. Most recent echo on 11/2016 showed preserved EF with inability to assess diastolic function given atrial fibrillation.  2d echo repeated, normal LVEF  #Bilateral hip pain/abdominal pain.  CT imaging shows large stool burden.  Bowel regimen was advanced.  Additionally CT imaging describes some gallbladder stranding, right upper quadrant ultrasound with findings of possible CBD dilation and possible cholecystitis. Ordered and repeat MRCP. CBD appears enlarged, however likely age related per radiology. LFT's remain normal. Doubt gallbladder disease. Continued bowel movements noted overnight. Abd appears tympanic with good bowel sounds  #Rectal wall thickening. Mentioned on CT pelvis/hips on 12/13.  She has had episodes of not being  able to control her bowels/fecal incontinence but this has been ongoing prior to admission per POA..  Noted to  have normal rectal tone on exam and no melena or bleeding on 12/13. Stable at present  #Minimally displaced lower left rib fractures, stable.  Pain control with lidocaine patch (started 12/10), PRN tramadol (1/2 tab per family request), scheduled Toradol completed, scheduled tylenol.  Supportive care with incentive spirometry. Remains stable at this time  #Orthostatic hypotension, resolved.  Blood pressure much more improved while off amlodipine (discontinued on 12/15).  No longer orthostatic on vitals checked on 12/15.  Continue to monitor.  Continued with compression hoses. Stable  #Leukocytosis, resolved.  Has remained afebrile.  No clear site of infection. Currently stable   #Unwitnessed fall.  Unclear etiology, weird setting of potential mechanical fall.  PT recommends SNF for continued therapy with mobility given balance deficits.  Likely related to orthostatic hypotension. Pending SNF placement pending  #Hypertension, previously quite high during hospitalization now resolved while off amlodipine. Stable  #E. coli UTI, completed 5 day course of keflex.   Patient unable to report any previous urinary symptoms prior to hospital stay but does report urinary frequency while here. Remains stable  #Bilateral lower extremity skin tears, stable.  Dressings in place.  Continue to monitor.  Wound care consulted to assist with dressing changes instructions for when ready for rehab facility  #Paroxysmal atrial flutter, rate controlled.  Continue home digoxin.  Not on any anticoagulation as outpatient. Remains stable  #Poor nutrition, chronic.  Has  been ongoing since prior to admission.  Patient has intermittent appetite, likely resulting in presenting orthostatic hypotension.  dietician consulted. Supplemental protein shakes as tolerated   DVT prophylaxis: Lovenox subQ Code Status: Full Family Communication: Pt in room, family at bedside Disposition Plan: SNF possible in  24-48hrs  Consultants:     Procedures:     Antimicrobials: Anti-infectives (From admission, onward)   Start     Dose/Rate Route Frequency Ordered Stop   11/06/18 2200  cephALEXin (KEFLEX) capsule 500 mg  Status:  Discontinued     500 mg Oral Every 12 hours 11/06/18 1224 11/12/18 1313   11/06/18 0600  cephALEXin (KEFLEX) capsule 500 mg  Status:  Discontinued     500 mg Oral Every 8 hours 11/05/18 1140 11/06/18 1224   11/05/18 1015  cefTRIAXone (ROCEPHIN) 1 g in sodium chloride 0.9 % 100 mL IVPB     1 g 200 mL/hr over 30 Minutes Intravenous  Once 11/05/18 1011 11/05/18 1054      Subjective: Complaining of abd distension and some nausea  Objective: Vitals:   11/16/18 1839 11/16/18 2023 11/17/18 0443 11/17/18 1410  BP: 127/65 126/84 (!) 142/77 110/66  Pulse: 76 78 77 (!) 41  Resp: 16 18 16 17   Temp: 98.5 F (36.9 C) 98.4 F (36.9 C) 98 F (36.7 C) 97.8 F (36.6 C)  TempSrc: Oral Oral Oral Oral  SpO2: 94%  94% 98%  Weight:      Height:        Intake/Output Summary (Last 24 hours) at 11/17/2018 1459 Last data filed at 11/17/2018 1252 Gross per 24 hour  Intake 600 ml  Output -  Net 600 ml   Filed Weights   11/09/18 1900 11/16/18 0425 11/16/18 1100  Weight: 54.6 kg 55.4 kg 54.6 kg    Examination: General exam: Awake, laying in bed, in nad Respiratory system: Normal respiratory effort, no wheezing Cardiovascular system: regular rate, s1, s2 Gastrointestinal system: mildly distended,  hyperactive BS, tympanic Central nervous system: CN2-12 grossly intact, strength intact Extremities: Perfused, no clubbing Skin: Normal skin turgor, no notable skin lesions seen Psychiatry: Mood normal // no visual hallucinations   Data Reviewed: I have personally reviewed following labs and imaging studies  CBC: Recent Labs  Lab 11/13/18 0413 11/14/18 0318 11/15/18 0347 11/16/18 0215 11/17/18 0204  WBC 8.5 8.5 9.0 8.6 7.8  HGB 11.5* 12.4 11.9* 11.9* 12.0  HCT 36.7 38.7  37.7 36.9 36.3  MCV 102.8* 101.0* 101.6* 100.3* 99.7  PLT 195 220 256 251 288   Basic Metabolic Panel: Recent Labs  Lab 11/13/18 0413 11/14/18 0318 11/15/18 0347 11/16/18 0215 11/17/18 0204  NA 138 141 140 140 140  K 3.8 3.8 3.8 3.6 4.2  CL 106 106 106 107 105  CO2 24 25 26 26 27   GLUCOSE 84 94 96 93 97  BUN 17 15 14 12 13   CREATININE 0.65 0.73 0.76 0.70 0.65  CALCIUM 8.0* 8.5* 8.2* 8.2* 8.4*   GFR: Estimated Creatinine Clearance: 44.3 mL/min (by C-G formula based on SCr of 0.65 mg/dL). Liver Function Tests: Recent Labs  Lab 11/15/18 0347 11/17/18 0204  AST 16 19  ALT 19 21  ALKPHOS 62 66  BILITOT 0.5 0.5  PROT 5.3* 5.3*  ALBUMIN 2.4* 2.5*   No results for input(s): LIPASE, AMYLASE in the last 168 hours. No results for input(s): AMMONIA in the last 168 hours. Coagulation Profile: No results for input(s): INR, PROTIME in the last 168 hours. Cardiac Enzymes: No results for input(s): CKTOTAL, CKMB, CKMBINDEX, TROPONINI in the last 168 hours. BNP (last 3 results) No results for input(s): PROBNP in the last 8760 hours. HbA1C: No results for input(s): HGBA1C in the last 72 hours. CBG: No results for input(s): GLUCAP in the last 168 hours. Lipid Profile: No results for input(s): CHOL, HDL, LDLCALC, TRIG, CHOLHDL, LDLDIRECT in the last 72 hours. Thyroid Function Tests: No results for input(s): TSH, T4TOTAL, FREET4, T3FREE, THYROIDAB in the last 72 hours. Anemia Panel: No results for input(s): VITAMINB12, FOLATE, FERRITIN, TIBC, IRON, RETICCTPCT in the last 72 hours. Sepsis Labs: No results for input(s): PROCALCITON, LATICACIDVEN in the last 168 hours.  No results found for this or any previous visit (from the past 240 hour(s)).   Radiology Studies: Mr 3d Recon At Scanner  Result Date: 11/17/2018 CLINICAL DATA:  Common bile duct dilatation. EXAM: MRI ABDOMEN WITHOUT AND WITH CONTRAST (INCLUDING MRCP) TECHNIQUE: Multiplanar multisequence MR imaging of the abdomen  was performed both before and after the administration of intravenous contrast. Heavily T2-weighted images of the biliary and pancreatic ducts were obtained, and three-dimensional MRCP images were rendered by post processing. CONTRAST:  5.5 cc Gadavist COMPARISON:  CT scan 11/15/2018 and ultrasound examination 11/16/2018. FINDINGS: Examination is extremely limited due to respiratory motion. Lower chest: Cardiac enlargement is noted. No pericardial effusion. There are moderate-sized bilateral pleural effusions with overlying atelectasis. Hepatobiliary: No focal hepatic lesions. No intrahepatic biliary dilatation. The gallbladder is unremarkable. No gallstones or pericholecystic fluid. Mild common bile duct dilatation may be age related. No obvious common bile duct stones and no ampullary mass or pancreatic head mass. The portal and hepatic veins are patent. Pancreas:  No mass, inflammation or ductal dilatation. Spleen:  Normal size.  No focal lesions. Adrenals/Urinary Tract: The adrenal glands and kidneys are unremarkable. Left renal cysts are noted. Stomach/Bowel: The stomach, duodenum, visualized small bowel and visualize colon are grossly normal. Vascular/Lymphatic: The aorta and branch vessels are patent. Moderate atherosclerotic  calcifications. An IVC filter is noted. No mesenteric or retroperitoneal mass or adenopathy. Other:  No ascites or abdominal wall hernia. Musculoskeletal: No significant bony findings. IMPRESSION: Very limited examination due to respiratory motion. 1. Cardiac enlargement with moderate-sized bilateral pleural effusions and overlying bibasilar atelectasis. 2. Mild common bile duct dilatation measuring up to 10.5 mm but no evidence of obstruction. This is likely age related. 3. No MR findings to suggest cholelithiasis or acute cholecystitis. 4. No pancreatic mass or inflammation. Electronically Signed   By: Rudie Meyer M.D.   On: 11/17/2018 07:17   Mr Abdomen Mrcp Vivien Rossetti Contast  Result  Date: 11/17/2018 CLINICAL DATA:  Common bile duct dilatation. EXAM: MRI ABDOMEN WITHOUT AND WITH CONTRAST (INCLUDING MRCP) TECHNIQUE: Multiplanar multisequence MR imaging of the abdomen was performed both before and after the administration of intravenous contrast. Heavily T2-weighted images of the biliary and pancreatic ducts were obtained, and three-dimensional MRCP images were rendered by post processing. CONTRAST:  5.5 cc Gadavist COMPARISON:  CT scan 11/15/2018 and ultrasound examination 11/16/2018. FINDINGS: Examination is extremely limited due to respiratory motion. Lower chest: Cardiac enlargement is noted. No pericardial effusion. There are moderate-sized bilateral pleural effusions with overlying atelectasis. Hepatobiliary: No focal hepatic lesions. No intrahepatic biliary dilatation. The gallbladder is unremarkable. No gallstones or pericholecystic fluid. Mild common bile duct dilatation may be age related. No obvious common bile duct stones and no ampullary mass or pancreatic head mass. The portal and hepatic veins are patent. Pancreas:  No mass, inflammation or ductal dilatation. Spleen:  Normal size.  No focal lesions. Adrenals/Urinary Tract: The adrenal glands and kidneys are unremarkable. Left renal cysts are noted. Stomach/Bowel: The stomach, duodenum, visualized small bowel and visualize colon are grossly normal. Vascular/Lymphatic: The aorta and branch vessels are patent. Moderate atherosclerotic calcifications. An IVC filter is noted. No mesenteric or retroperitoneal mass or adenopathy. Other:  No ascites or abdominal wall hernia. Musculoskeletal: No significant bony findings. IMPRESSION: Very limited examination due to respiratory motion. 1. Cardiac enlargement with moderate-sized bilateral pleural effusions and overlying bibasilar atelectasis. 2. Mild common bile duct dilatation measuring up to 10.5 mm but no evidence of obstruction. This is likely age related. 3. No MR findings to suggest  cholelithiasis or acute cholecystitis. 4. No pancreatic mass or inflammation. Electronically Signed   By: Rudie Meyer M.D.   On: 11/17/2018 07:17   US Abdomen Limited Ruq  Result Date: 11/16/2018 CLINICAL DATA:  Abdominal pain. Possible pericholecystic stranding on CT. EXAM: ULTRASOUND ABDOMEN LIMITED RIGHT UPPER QUADRANT COMPARISON:  Abdominopelvic CT earlier this day. FINDINGS: Technically challenging exam due to patient condition and inability to breath hold. Gallbladder: Distended with wall thickening at 6 mm. No evident gallstone. No pericholecystic fluid. A positive sonographic Eulah Pont sign is noted by sonographer. Common bile duct: Diameter: Dilated at 10 mm, no visualized choledocholithiasis. Liver: No focal lesion identified. Mildly heterogeneous in parenchymal echogenicity. Portal vein is patent on color Doppler imaging with normal direction of blood flow towards the liver. Right pleural effusion is noted. IMPRESSION: 1. Distended gallbladder and common bile duct dilatation. Gallbladder wall thickening without gallstones. Imaging findings are suspicious for common bile duct obstruction, however patient has normal LFTs. While the test of choice for further evaluation would be MRCP, this would be of limited value in this elderly patient who is unable to breath hold. Consider further evaluation with ERCP. 2. Positive sonographic Murphy sign, which in the setting of gallbladder distention is suspicious for acute acalculous cholecystitis. Electronically Signed  By: Narda Rutherford M.D.   On: 11/16/2018 02:07    Scheduled Meds: . acetaminophen  1,000 mg Oral TID  . acidophilus  1 capsule Oral Daily  . aspirin EC  81 mg Oral Daily  . digoxin  125 mcg Oral Daily  . enoxaparin (LOVENOX) injection  40 mg Subcutaneous Q24H  . famotidine  10 mg Oral BID  . latanoprost  1 drop Both Eyes QHS  . lidocaine  1 patch Transdermal Q24H  . vitamin B-12  500 mcg Oral Daily   Continuous Infusions:   LOS:  6 days   Rickey Barbara, MD [facility] Pager On Amion  If 7PM-7AM, please contact night-coverage 11/17/2018, 2:59 PM

## 2018-11-17 NOTE — Progress Notes (Addendum)
Spoke with friends of the family, they have concerns about patient's medical status. RN sent text page to doctor asking for the doctor to contact the friends of the family about medical status, tests being done, and discharge status. RN notified family of patients current BP, still waiting for stool. Patient c/o nausea and stomach pains. MD aware of situation.   Educated friends of the family on patient's enteric precautions. They verbalized understanding.   Friends of the family would also like to have protein drinks discontinued. This information was forwarded to the MD.  RN went into room with Morrie SheldonAshley, CSW and stayed with her while she explained the process of SNF placement to family. Friends of the family stated they understood and would like a list of the top 3 places to place family member. Morrie Sheldonshley, CSW repeated back to family exactly what they needed/wanted, friends of the family verbalized understanding.   Will continue to monitor patient at this time.

## 2018-11-17 NOTE — Progress Notes (Signed)
Pt had 6 runs of VTach. Asymptomatic. Back to Afib. HR 56. [facility] is notified. Nursing will continue to monitor.

## 2018-11-17 NOTE — Plan of Care (Signed)
  Problem: Activity: Goal: Risk for activity intolerance will decrease Outcome: Progressing   Problem: Nutrition: Goal: Adequate nutrition will be maintained Outcome: Progressing   Problem: Safety: Goal: Ability to remain free from injury will improve Outcome: Progressing   

## 2018-11-17 NOTE — Progress Notes (Addendum)
CSW met with patient and friends of patient at bedside [name] Weber and [name] Weber. CSW accompanied by nurse [place] as witness. CSW explained that bed at [facility] was no longer avail and that insurance auth would need to be re started once a facility was identified due to patient's continued medical workup that has prolonged previously expected discharge. Family requested a list to review, CSW reported back that list will be provided today however family would like CSW to check with [facility] again before providing list.   CSW ensured right phone number for POA [name] Weber was given, as CSW had called several times yesterday and phone went to voicemail. [name] Weber reported she has had no voicemails, however upon checking her phone it is noted that [facility] had blocked CSW's phone number. She reported this must have been a mistake and unblocked CSW's phone number. CSW reiterated importance of communication regarding patient's discharge planning. Friends of patient at bedside continue to appear reluctant to patient discharging from hospital.   Friend at bedside [name] Weber expressed concerns patient not able to participate in PT and that health is declining. CSW educated friend that patient continues to be appropriate for SNF at this time.   CSW will continue to follow for discharge planning needs.   [place], [place]

## 2018-11-17 NOTE — Progress Notes (Addendum)
MD text paged. Friends of the family (Vi and Lucendia HerrlichFaye) would like patient's protein drink to be d/c'd. Pt and friends of the family state they understand what the drink is for but still do not want it and do not want to be charged for it due to patient not drinking the protein drink.

## 2018-11-18 LAB — CBC
HCT: 35.9 % — ABNORMAL LOW (ref 36.0–46.0)
Hemoglobin: 11.3 g/dL — ABNORMAL LOW (ref 12.0–15.0)
MCH: 31.7 pg (ref 26.0–34.0)
MCHC: 31.5 g/dL (ref 30.0–36.0)
MCV: 100.6 fL — ABNORMAL HIGH (ref 80.0–100.0)
Platelets: 309 10*3/uL (ref 150–400)
RBC: 3.57 MIL/uL — ABNORMAL LOW (ref 3.87–5.11)
RDW: 12 % (ref 11.5–15.5)
WBC: 5.3 10*3/uL (ref 4.0–10.5)
nRBC: 0 % (ref 0.0–0.2)

## 2018-11-18 LAB — COMPREHENSIVE METABOLIC PANEL
ALT: 18 U/L (ref 0–44)
AST: 20 U/L (ref 15–41)
Albumin: 2.4 g/dL — ABNORMAL LOW (ref 3.5–5.0)
Alkaline Phosphatase: 56 U/L (ref 38–126)
Anion gap: 9 (ref 5–15)
BUN: 9 mg/dL (ref 8–23)
CO2: 28 mmol/L (ref 22–32)
Calcium: 8.4 mg/dL — ABNORMAL LOW (ref 8.9–10.3)
Chloride: 106 mmol/L (ref 98–111)
Creatinine, Ser: 0.75 mg/dL (ref 0.44–1.00)
GFR calc Af Amer: >60 mL/min (ref >60)
GFR calc non Af Amer: >60 mL/min (ref >60)
Glucose, Bld: 88 mg/dL (ref 70–99)
Potassium: 3.5 mmol/L (ref 3.5–5.1)
Sodium: 143 mmol/L (ref 135–145)
Total Bilirubin: 0.5 mg/dL (ref 0.3–1.2)
Total Protein: 5.2 g/dL — ABNORMAL LOW (ref 6.5–8.1)

## 2018-11-18 NOTE — Plan of Care (Signed)
°  Problem: Education: °Goal: Knowledge of General Education information will improve °Description: Including pain rating scale, medication(s)/side effects and non-pharmacologic comfort measures °Outcome: Progressing °  °Problem: Clinical Measurements: °Goal: Will remain free from infection °Outcome: Progressing °  °Problem: Activity: °Goal: Risk for activity intolerance will decrease °Outcome: Progressing °  °Problem: Elimination: °Goal: Will not experience complications related to bowel motility °Outcome: Progressing °  °Problem: Safety: °Goal: Ability to remain free from injury will improve °Outcome: Progressing °  °Problem: Skin Integrity: °Goal: Risk for impaired skin integrity will decrease °Outcome: Progressing °  °

## 2018-11-18 NOTE — Progress Notes (Signed)
Physical Therapy Treatment Patient Details Name: Stephani Policevelyn C Florio MRN: [id number] DOB: 12/16/1930 Today's Date: 11/18/2018    History of Present Illness Pt is an 84 y/o female with a past medical history of Fatigue, Malaise, Myalgia, PAC (premature atrial contraction), Palpitations, Paroxysmal atrial flutter, and PVC's (premature ventricular contractions). Pt sustained a fall at home and was wedged between tub and toilet. Pt became faint and dizzy with initial attempts to ambulate in ED, admitted for further workup. Imaging revealed multiple left rib fractures of the lower ribs, CT pelvis negative, CT abdomen on 12/17 showed large stool burden for which bowel regimen started. CT also showed questionable gallbladder stranding with concern for possible cholecystitis     PT Comments    Pt is progressing well with her gait and mobility, however, still requires min assist overall for safe mobility.  She continues to have loose stools.  Visiting friends report her gait pattern is normally like it is currently (abnormal since her pelvic fx).  Pt reports she is 65% back to her normal self.  PT will continue to follow acutely for safe mobility progression   Follow Up Recommendations  SNF;Supervision/Assistance - 24 hour     Equipment Recommendations  None recommended by PT    Recommendations for Other Services   NA     Precautions / Restrictions Precautions Precautions: Fall Precaution Comments: L rib fxs    Mobility  Bed Mobility Overal bed mobility: Needs Assistance Bed Mobility: Supine to Sit   Sidelying to sit: Min assist       General bed mobility comments: Min hand held assist to pull up to sitting and support trunk while she scoots to EOB.   Transfers Overall transfer level: Needs assistance Equipment used: Rolling walker (2 wheeled) Transfers: Sit to/from [employer]GI CorporationStand;Stand Pivot Transfers Sit to Stand: Min assist Stand pivot transfers: Min assist       General transfer  comment: Min assist to stand multiple times from bed and BSC for peri care  Ambulation/Gait Ambulation/Gait assistance: Min assist Gait Distance (Feet): 110 Feet Assistive device: Rolling walker (2 wheeled) Gait Pattern/deviations: Step-through pattern;Shuffle;Steppage;Ataxic Gait velocity: decreased Gait velocity interpretation: 1.31 - 2.62 ft/sec, indicative of limited community ambulator General Gait Details: Pt has an interesting steppage/uncoordinated gait pattern that her visitors (her neighbors) report is normal since she broke her pelvis.         Balance Overall balance assessment: Needs assistance Sitting-balance support: Feet supported;No upper extremity supported Sitting balance-Leahy Scale: Good     Standing balance support: Bilateral upper extremity supported Standing balance-Leahy Scale: Poor Standing balance comment: needs support from RW and therapist                            Cognition Arousal/Alertness: Awake/alert Behavior During Therapy: [facility]FL for tasks assessed/performed                                   General Comments: Not formally assessed.  Conversation was normal.       Exercises General Exercises - Upper Extremity Shoulder Flexion: AROM;Both;10 reps General Exercises - Lower Extremity Ankle Circles/Pumps: AROM;Both;20 reps Long Arc Quad: AROM;Both;10 reps Hip ABduction/ADduction: AROM;Both;10 reps Hip Flexion/Marching: AROM;Both;10 reps Other Exercises Other Exercises: IS x 5 reps 1250 mL max inspired volume, cues for slower speed    General Comments General comments (skin integrity, edema, etc.): Pt reports she is 65%  back to her normal self      Pertinent Vitals/Pain Pain Assessment: Faces Faces Pain Scale: Hurts little more Pain Location: left ribs Pain Descriptors / Indicators: Grimacing;Guarding Pain Intervention(s): Limited activity within patient's tolerance;Monitored during session;Repositioned         PT Goals (current goals can now be found in the care plan section) Acute Rehab PT Goals Patient Stated Goal: to go to rehab so she can get strong enough to go home with her dog Progress towards PT goals: Progressing toward goals    Frequency    Min 3X/week      PT Plan Current plan remains appropriate       AM-PAC PT "6 Clicks" Mobility   Outcome Measure  Help needed turning from your back to your side while in a flat bed without using bedrails?: A Little Help needed moving from lying on your back to sitting on the side of a flat bed without using bedrails?: A Little Help needed moving to and from a bed to a chair (including a wheelchair)?: A Little Help needed standing up from a chair using your arms (e.g., wheelchair or bedside chair)?: A Little Help needed to walk in hospital room?: A Little Help needed climbing 3-5 steps with a railing? : A Little 6 Click Score: 18    End of Session Equipment Utilized During Treatment: Gait belt Activity Tolerance: Patient tolerated treatment well Patient left: in chair;with call bell/phone within reach;with chair alarm set   PT Visit Diagnosis: Unsteadiness on feet (R26.81);Other abnormalities of gait and mobility (R26.89);Muscle weakness (generalized) (M62.81);History of falling (Z91.81)     Time: [id number] PT Time Calculation (min) (ACUTE ONLY): 36 min  Charges:  $Gait Training: 8-22 mins $Therapeutic Activity: 8-22 mins                    Jair Lindblad B. Sacoya Mcgourty, PT, DPT  Acute Rehabilitation [phone] pager #[phone] office    11/18/2018, 6:26 PM

## 2018-11-18 NOTE — Progress Notes (Signed)
[facility] has insurance approval for patient to discharge there on Saturday.   Osborne Cascoadia Chavonne Sforza LCSW [phone]

## 2018-11-18 NOTE — Progress Notes (Signed)
PROGRESS NOTE    [name] Weber  ZOX:[id number] DOB: 02/23/1931 DOA: 11/05/2018 PCP: Marden NobleGates, Robert, MD    Brief Narrative:  84 y.o. year old female with medical history significant for fatigue, malaise, paroxysmal atrial fibrillation (not on anticoagulation) occasional palpitations osteoporosis, anxiety disorder, pulmonary hypertension, and remote pelvic fracture  who presented on 11/05/2018 to the ED after being found wedged between the toilet and her tub.  Upon discussion she states somehow she slid off the toilet denied any preceding signs or symptoms.  She became faint and dizzy upon initial attempt to ambulate in the ED prompting admission for further work-up.  She was found to have abnormal urinalysis with E. coli on urine culture, she does not remember any urinary symptoms but here has had urinary frequency concerning for UTI as well as multiple rib fractures on bilateral lower extremity skin tears in the setting of fall.    Hospital course complicated by usually plan to discharge to skilled nurse facility however patient continued to have symptomatic orthostatic hypotension that resolved with supportive care including compression stockings discontinuation of amlodipine (started for hypertension noted throughout hospital stay). Patient continued to report nonspecific abdominal/pelvis pain.  CT pelvis imaging on 12/13 showed no fractures of the hip or pelvis.  CT abdomen on 12/17 showed large stool burden for which bowel regimen started.  It additionally noted moderately sized pleural effusions.  BNP was obtained which was greater than 300, in the setting of cardiomegaly on previous imaging patient was started on IV Lasix 20 mg x 1 on 12/17.  TTE was also ordered given last echo was obtained 11/2016.  CT also showed questionable gallbladder stranding with concern for possible cholecystitis and patient currently awaiting right upper quadrant ultrasound.  Nursing also noted some pink-tinged  blood?  On patient's changing the pad after bowel movements.  Of note, CT pelvis imaging on 12/13 showed rectal wall thickening that could represent inflammation of the rectum.   Assessment & Plan:   Principal Problem:   Multiple rib fractures Active Problems:   Benign hypertensive heart disease without heart failure   HLD (hyperlipidemia)   Weakness of both legs   Near syncope   Chronic constipation   A-fib (HCC)   Osteoporosis with current pathological fracture   Chronic midline low back pain without sciatica   Cystitis   B12 deficiency   Rib fracture   Orthostatic hypotension   Rectal abnormality   Leukocytosis   Abdominal pain   Skin tear of lower leg without complication, left, initial encounter   Skin tear of lower leg without complication, right, initial encounter   Malnutrition of moderate degree   Bilateral pleural effusion   Elevated brain natriuretic peptide (BNP) level   Cardiomegaly  #Bilateral pleural effusions, cardiomegaly, elevated BNP.  Concerning for CHF flare, no peripheral edema, denies any dyspnea, no oxygen requirements. Most recent echo on 11/2016 showed preserved EF with inability to assess diastolic function given atrial fibrillation.  2d echo repeated, normal LVEF. Appears euvolemic at this time  #Bilateral hip pain/abdominal pain.  CT imaging shows large stool burden.  Bowel regimen was advanced.  Additionally CT imaging describes some gallbladder stranding, right upper quadrant ultrasound with findings of possible CBD dilation and possible cholecystitis. Ordered and repeat MRCP. CBD appears enlarged, however likely age related per radiology. LFT's remain normal. Doubt gallbladder disease. Continued bowel movements noted overnight. Abd initially tympanic, now improved.  #Rectal wall thickening. Mentioned on CT pelvis/hips on 12/13.  She has had episodes  of not being able to control her bowels/fecal incontinence but this has been ongoing prior to  admission per POA..  Noted to have normal rectal tone on exam and no melena or bleeding on 12/13. Stable at present  #Minimally displaced lower left rib fractures, stable.  Pain control with lidocaine patch (started 12/10), PRN tramadol (1/2 tab per family request), scheduled Toradol completed, scheduled tylenol.  Supportive care with incentive spirometry. Remains stable at this time  #Orthostatic hypotension, resolved.  Blood pressure much more improved while off amlodipine (discontinued on 12/15).  No longer orthostatic on vitals checked on 12/15.  Continue to monitor.  Continued with compression hoses. Remains stable at this time  #Leukocytosis, resolved.  Has remained afebrile.  No clear site of infection. Currently stable   #Unwitnessed fall.  Unclear etiology, weird setting of potential mechanical fall.  PT recommends SNF for continued therapy with mobility given balance deficits.  Likely related to orthostatic hypotension. Pending SNF placement pending  #Hypertension, previously quite high during hospitalization now resolved while off amlodipine. Stable  #E. coli UTI, completed 5 day course of keflex.   Patient unable to report any previous urinary symptoms prior to hospital stay but does report urinary frequency while here. Remains stable  #Bilateral lower extremity skin tears, stable.  Dressings in place.  Continue to monitor.  Wound care consulted to assist with dressing changes instructions for when ready for rehab facility  #Paroxysmal atrial flutter, rate controlled.  Continue home digoxin.  Not on any anticoagulation as outpatient. Remains stable  #Poor nutrition, chronic.  Has  been ongoing since prior to admission.  Patient has intermittent appetite, likely resulting in presenting orthostatic hypotension.  dietician consulted. Supplemental protein shakes as tolerated   DVT prophylaxis: Lovenox subQ Code Status: Full Family Communication: Pt in room, family at  bedside Disposition Plan: SNF possible in 24-48hrs  Consultants:     Procedures:     Antimicrobials: Anti-infectives (From admission, onward)   Start     Dose/Rate Route Frequency Ordered Stop   11/06/18 2200  cephALEXin (KEFLEX) capsule 500 mg  Status:  Discontinued     500 mg Oral Every 12 hours 11/06/18 1224 11/12/18 1313   11/06/18 0600  cephALEXin (KEFLEX) capsule 500 mg  Status:  Discontinued     500 mg Oral Every 8 hours 11/05/18 1140 11/06/18 1224   11/05/18 1015  cefTRIAXone (ROCEPHIN) 1 g in sodium chloride 0.9 % 100 mL IVPB     1 g 200 mL/hr over 30 Minutes Intravenous  Once 11/05/18 1011 11/05/18 1054      Subjective: Wanting to take tele off. Loose stool overnight  Objective: Vitals:   11/18/18 0431 11/18/18 0500 11/18/18 0807 11/18/18 0810  BP: 125/74   130/75  Pulse: 61  84 76  Resp: 16     Temp: 98.3 F (36.8 C)     TempSrc: Oral     SpO2: 94%   95%  Weight:  54.7 kg    Height:        Intake/Output Summary (Last 24 hours) at 11/18/2018 1511 Last data filed at 11/18/2018 0755 Gross per 24 hour  Intake 360 ml  Output 250 ml  Net 110 ml   Filed Weights   11/16/18 0425 11/16/18 1100 11/18/18 0500  Weight: 55.4 kg 54.6 kg 54.7 kg    Examination: General exam: Conversant, in no acute distress Respiratory system: normal chest rise, clear, no audible wheezing Cardiovascular system: regular rhythm, s1-s2 Gastrointestinal system: Nondistended, nontender, pos  BS Central nervous system: No seizures, no tremors Extremities: No cyanosis, no joint deformities Skin: No rashes, no pallor, LE dressings Psychiatry: Affect normal // no auditory hallucinations   Data Reviewed: I have personally reviewed following labs and imaging studies  CBC: Recent Labs  Lab 11/14/18 0318 11/15/18 0347 11/16/18 0215 11/17/18 0204 11/18/18 0327  WBC 8.5 9.0 8.6 7.8 5.3  HGB 12.4 11.9* 11.9* 12.0 11.3*  HCT 38.7 37.7 36.9 36.3 35.9*  MCV 101.0* 101.6* 100.3* 99.7  100.6*  PLT 220 256 251 288 309   Basic Metabolic Panel: Recent Labs  Lab 11/14/18 0318 11/15/18 0347 11/16/18 0215 11/17/18 0204 11/18/18 0327  NA 141 140 140 140 143  K 3.8 3.8 3.6 4.2 3.5  CL 106 106 107 105 106  CO2 25 26 26 27 28   GLUCOSE 94 96 93 97 88  BUN 15 14 12 13 9   CREATININE 0.73 0.76 0.70 0.65 0.75  CALCIUM 8.5* 8.2* 8.2* 8.4* 8.4*   GFR: Estimated Creatinine Clearance: 44.4 mL/min (by C-G formula based on SCr of 0.75 mg/dL). Liver Function Tests: Recent Labs  Lab 11/15/18 0347 11/17/18 0204 11/18/18 0327  AST 16 19 20   ALT 19 21 18   ALKPHOS 62 66 56  BILITOT 0.5 0.5 0.5  PROT 5.3* 5.3* 5.2*  ALBUMIN 2.4* 2.5* 2.4*   No results for input(s): LIPASE, AMYLASE in the last 168 hours. No results for input(s): AMMONIA in the last 168 hours. Coagulation Profile: No results for input(s): INR, PROTIME in the last 168 hours. Cardiac Enzymes: No results for input(s): CKTOTAL, CKMB, CKMBINDEX, TROPONINI in the last 168 hours. BNP (last 3 results) No results for input(s): PROBNP in the last 8760 hours. HbA1C: No results for input(s): HGBA1C in the last 72 hours. CBG: No results for input(s): GLUCAP in the last 168 hours. Lipid Profile: No results for input(s): CHOL, HDL, LDLCALC, TRIG, CHOLHDL, LDLDIRECT in the last 72 hours. Thyroid Function Tests: No results for input(s): TSH, T4TOTAL, FREET4, T3FREE, THYROIDAB in the last 72 hours. Anemia Panel: No results for input(s): VITAMINB12, FOLATE, FERRITIN, TIBC, IRON, RETICCTPCT in the last 72 hours. Sepsis Labs: No results for input(s): PROCALCITON, LATICACIDVEN in the last 168 hours.  No results found for this or any previous visit (from the past 240 hour(s)).   Radiology Studies: Mr 3d Recon At Scanner  Result Date: 11/17/2018 CLINICAL DATA:  Common bile duct dilatation. EXAM: MRI ABDOMEN WITHOUT AND WITH CONTRAST (INCLUDING MRCP) TECHNIQUE: Multiplanar multisequence MR imaging of the abdomen was  performed both before and after the administration of intravenous contrast. Heavily T2-weighted images of the biliary and pancreatic ducts were obtained, and three-dimensional MRCP images were rendered by post processing. CONTRAST:  5.5 cc Gadavist COMPARISON:  CT scan 11/15/2018 and ultrasound examination 11/16/2018. FINDINGS: Examination is extremely limited due to respiratory motion. Lower chest: Cardiac enlargement is noted. No pericardial effusion. There are moderate-sized bilateral pleural effusions with overlying atelectasis. Hepatobiliary: No focal hepatic lesions. No intrahepatic biliary dilatation. The gallbladder is unremarkable. No gallstones or pericholecystic fluid. Mild common bile duct dilatation may be age related. No obvious common bile duct stones and no ampullary mass or pancreatic head mass. The portal and hepatic veins are patent. Pancreas:  No mass, inflammation or ductal dilatation. Spleen:  Normal size.  No focal lesions. Adrenals/Urinary Tract: The adrenal glands and kidneys are unremarkable. Left renal cysts are noted. Stomach/Bowel: The stomach, duodenum, visualized small bowel and visualize colon are grossly normal. Vascular/Lymphatic: The aorta and branch  vessels are patent. Moderate atherosclerotic calcifications. An IVC filter is noted. No mesenteric or retroperitoneal mass or adenopathy. Other:  No ascites or abdominal wall hernia. Musculoskeletal: No significant bony findings. IMPRESSION: Very limited examination due to respiratory motion. 1. Cardiac enlargement with moderate-sized bilateral pleural effusions and overlying bibasilar atelectasis. 2. Mild common bile duct dilatation measuring up to 10.5 mm but no evidence of obstruction. This is likely age related. 3. No MR findings to suggest cholelithiasis or acute cholecystitis. 4. No pancreatic mass or inflammation. Electronically Signed   By: Rudie MeyerP.  Gallerani M.D.   On: 11/17/2018 07:17   Mr Abdomen Mrcp Vivien RossettiW Wo Contast  Result  Date: 11/17/2018 CLINICAL DATA:  Common bile duct dilatation. EXAM: MRI ABDOMEN WITHOUT AND WITH CONTRAST (INCLUDING MRCP) TECHNIQUE: Multiplanar multisequence MR imaging of the abdomen was performed both before and after the administration of intravenous contrast. Heavily T2-weighted images of the biliary and pancreatic ducts were obtained, and three-dimensional MRCP images were rendered by post processing. CONTRAST:  5.5 cc Gadavist COMPARISON:  CT scan 11/15/2018 and ultrasound examination 11/16/2018. FINDINGS: Examination is extremely limited due to respiratory motion. Lower chest: Cardiac enlargement is noted. No pericardial effusion. There are moderate-sized bilateral pleural effusions with overlying atelectasis. Hepatobiliary: No focal hepatic lesions. No intrahepatic biliary dilatation. The gallbladder is unremarkable. No gallstones or pericholecystic fluid. Mild common bile duct dilatation may be age related. No obvious common bile duct stones and no ampullary mass or pancreatic head mass. The portal and hepatic veins are patent. Pancreas:  No mass, inflammation or ductal dilatation. Spleen:  Normal size.  No focal lesions. Adrenals/Urinary Tract: The adrenal glands and kidneys are unremarkable. Left renal cysts are noted. Stomach/Bowel: The stomach, duodenum, visualized small bowel and visualize colon are grossly normal. Vascular/Lymphatic: The aorta and branch vessels are patent. Moderate atherosclerotic calcifications. An IVC filter is noted. No mesenteric or retroperitoneal mass or adenopathy. Other:  No ascites or abdominal wall hernia. Musculoskeletal: No significant bony findings. IMPRESSION: Very limited examination due to respiratory motion. 1. Cardiac enlargement with moderate-sized bilateral pleural effusions and overlying bibasilar atelectasis. 2. Mild common bile duct dilatation measuring up to 10.5 mm but no evidence of obstruction. This is likely age related. 3. No MR findings to suggest  cholelithiasis or acute cholecystitis. 4. No pancreatic mass or inflammation. Electronically Signed   By: Rudie MeyerP.  Gallerani M.D.   On: 11/17/2018 07:17    Scheduled Meds: . acetaminophen  1,000 mg Oral TID  . acidophilus  1 capsule Oral Daily  . aspirin EC  81 mg Oral Daily  . digoxin  125 mcg Oral Daily  . enoxaparin (LOVENOX) injection  40 mg Subcutaneous Q24H  . famotidine  10 mg Oral BID  . latanoprost  1 drop Both Eyes QHS  . lidocaine  1 patch Transdermal Q24H  . vitamin B-12  500 mcg Oral Daily   Continuous Infusions:   LOS: 7 days   Rickey BarbaraStephen Anokhi Shannon, MD [facility] Pager On Amion  If 7PM-7AM, please contact night-coverage 11/18/2018, 3:11 PM

## 2018-11-18 NOTE — Plan of Care (Signed)
  Problem: Education: Goal: Knowledge of General Education information will improve Description: Including pain rating scale, medication(s)/side effects and non-pharmacologic comfort measures Outcome: Progressing   Problem: Clinical Measurements: Goal: Ability to maintain clinical measurements within normal limits will improve Outcome: Progressing   Problem: Activity: Goal: Risk for activity intolerance will decrease Outcome: Progressing   Problem: Nutrition: Goal: Adequate nutrition will be maintained Outcome: Progressing   Problem: Elimination: Goal: Will not experience complications related to bowel motility Outcome: Progressing   Problem: Safety: Goal: Ability to remain free from injury will improve Outcome: Progressing   Problem: Skin Integrity: Goal: Risk for impaired skin integrity will decrease Outcome: Progressing   

## 2018-11-19 DIAGNOSIS — A499 Bacterial infection, unspecified: Secondary | ICD-10-CM

## 2018-11-19 LAB — CREATININE, SERUM
Creatinine, Ser: 0.65 mg/dL (ref 0.44–1.00)
GFR calc Af Amer: >60 mL/min (ref >60)
GFR calc non Af Amer: >60 mL/min (ref >60)

## 2018-11-19 MED ORDER — BISMUTH SUBSALICYLATE 262 MG/15ML PO SUSP: 30.0000 mL | ORAL | 0 refills | Status: DC | PRN

## 2018-11-19 MED ORDER — TRAMADOL HCL 50 MG PO TABS: 50.0000 mg | ORAL_TABLET | Freq: Four times a day (QID) | ORAL | 0 refills | Status: DC | PRN

## 2018-11-19 MED ORDER — DOCUSATE SODIUM 100 MG PO CAPS: 100.0000 mg | ORAL_CAPSULE | Freq: Every day | ORAL | 0 refills | Status: DC

## 2018-11-19 MED ORDER — FAMOTIDINE 10 MG PO TABS: 10.0000 mg | ORAL_TABLET | Freq: Every day | ORAL | 0 refills | Status: DC

## 2018-11-19 MED ORDER — POLYETHYLENE GLYCOL 3350 17 G PO PACK: 17.0000 g | PACK | Freq: Every day | ORAL | 0 refills | Status: DC | PRN

## 2018-11-19 MED ORDER — LIDOCAINE 5 % EX PTCH: 1.0000 | MEDICATED_PATCH | CUTANEOUS | 0 refills | Status: DC

## 2018-11-19 MED ORDER — AMLODIPINE BESYLATE 5 MG PO TABS: 5.0000 mg | ORAL_TABLET | Freq: Every day | ORAL | 0 refills | Status: DC

## 2018-11-19 MED ORDER — ASPIRIN 81 MG PO TBEC: 81.0000 mg | DELAYED_RELEASE_TABLET | Freq: Every day | ORAL | 12 refills | Status: DC

## 2018-11-19 MED ORDER — CYANOCOBALAMIN 500 MCG PO TABS: 500.0000 ug | ORAL_TABLET | Freq: Every day | ORAL | 0 refills | Status: DC

## 2018-11-19 MED ORDER — DIGOXIN 125 MCG PO TABS: 125.0000 ug | ORAL_TABLET | Freq: Every day | ORAL | 3 refills | Status: DC

## 2018-11-19 NOTE — Progress Notes (Signed)
Patient is set to discharge to [facility] today. Patient & POA, Vi, aware. Discharge packet given to RN. [facility] called for transport.   Stacy GardnerErin Saurav Crumble, LCSW Clinical Social Worker  [phone]

## 2018-11-19 NOTE — Plan of Care (Signed)
  Problem: Education: Goal: Knowledge of General Education information will improve Description Including pain rating scale, medication(s)/side effects and non-pharmacologic comfort measures Outcome: Progressing   

## 2018-11-19 NOTE — Progress Notes (Signed)
Peripheral IV removed and pt tolerated well. POA (Viola) called and has pt's cell phone. Pt packed and assisted to toilet. Oral and written report given to EMS. [facility]

## 2018-11-19 NOTE — Discharge Summary (Signed)
Physician Discharge Summary  [name] Weber ZOX:[id number] DOB: 04/29/1931 DOA: 11/05/2018  PCP: Marden NobleGates, Robert, MD  Admit date: 11/05/2018 Discharge date: 11/19/2018  Admitted From: Home Disposition:  SNF  Recommendations for Outpatient Follow-up:  1. Follow up with PCP in 1-2 weeks 2. Please ensure patient has regular bowel movements  Discharge Condition:Improved CODE STATUS:Full Diet recommendation: Heart healthy   Brief/Interim Summary: 84 y.o.year old femalewith medical history significant for fatigue, malaise, paroxysmal atrial fibrillation (not on anticoagulation) occasional palpitations osteoporosis, anxiety disorder, pulmonary hypertension, and remote pelvic fracture who presented on 12/7/2019to the ED after being found wedged between the toilet and her tub. Upon discussion she states somehow she slid off the toilet denied any preceding signs or symptoms. She became faint and dizzy upon initial attempt to ambulate in the ED prompting admission for further work-up. She was found to have abnormal urinalysis with E. coli on urine culture, she does not remember any urinary symptoms but here has had urinary frequency concerning for UTI as well as multiple rib fractures on bilateral lower extremity skin tears in the setting of fall.   Hospital course complicated by usually plan to discharge to skilled nurse facility however patient continued to have symptomatic orthostatic hypotension that resolved with supportive care including compression stockings discontinuation of amlodipine (started for hypertension noted throughout hospital stay). Patient continued to report nonspecific abdominal/pelvis pain. CT pelvis imaging on 12/13 showed no fractures of the hip or pelvis. CT abdomen on 12/17 showed large stool burden forwhich bowel regimen started. It additionally noted moderately sized pleural effusions. BNP was obtained which was greater than 300, in the setting of cardiomegaly on  previous imaging patient was started on IV Lasix 20 mg x 1 on 12/17. TTE was also ordered given last echo was obtained 11/2016.  CT also showed questionable gallbladder stranding with concern for possible cholecystitis and patient currently awaiting right upper quadrant ultrasound.  Nursing also noted some pink-tinged blood? On patient's changing the pad after bowel movements. Of note, CT pelvis imaging on 12/13 showed rectal wall thickening that could represent inflammation of the rectum.  Discharge Diagnoses:  Principal Problem:   Multiple rib fractures Active Problems:   Benign hypertensive heart disease without heart failure   HLD (hyperlipidemia)   Weakness of both legs   Near syncope   Chronic constipation   A-fib (HCC)   Osteoporosis with current pathological fracture   Chronic midline low back pain without sciatica   Cystitis   B12 deficiency   Rib fracture   Orthostatic hypotension   Rectal abnormality   Leukocytosis   Abdominal pain   Skin tear of lower leg without complication, left, initial encounter   Skin tear of lower leg without complication, right, initial encounter   Malnutrition of moderate degree   Bilateral pleural effusion   Elevated brain natriuretic peptide (BNP) level   Cardiomegaly  #Bilateral pleural effusions, cardiomegaly, elevated BNP. Concerning for CHF flare, no peripheral edema, denies any dyspnea, no oxygen requirements. Most recent echo on 11/2016 showed preserved EF with inability to assess diastolic function given atrial fibrillation. 2d echo repeated, normal LVEF. Appears euvolemic at this time  #Bilateral hip pain/abdominal pain.CT imaging shows large stool burden. Bowel regimen was advanced. Additionally CT imaging describes some gallbladder stranding, right upper quadrant ultrasound with findings of possible CBD dilation and possible cholecystitis. Ordered and repeat MRCP. CBD appears enlarged, however likely age related per  radiology. LFT's remain normal. Doubt gallbladder disease. Continued bowel movements noted overnight. Abd initially  tympanic, now improved.  #Rectal wall thickening. Mentioned on CT pelvis/hipson 12/13. She has had episodes of not being able to control her bowels/fecal incontinence but this has been ongoing prior to admission per POA.. Noted to have normal rectal tone on exam and no melena or bleedingon 12/13. Stable at present  #Minimally displaced lower left rib fractures, stable. Pain control with lidocaine patch (started 12/10), PRN tramadol (1/2 tab per family request), scheduled Toradolcompleted, scheduled tylenol. Supportive care with incentive spirometry. Remains stable at this time  #Orthostatic hypotension,resolved. Blood pressure much more improved while off amlodipine (discontinued on 12/15). No longer orthostatic on vitals checked on 12/15. Continue to monitor. Continued with compression hoses. Remains stable at this time  #Leukocytosis, resolved. Has remained afebrile. No clear site of infection. Currently stable  #Unwitnessed fall. Unclear etiology, weird setting of potential mechanical fall. PT recommends SNF for continued therapy with mobility given balance deficits. Likely related to orthostatic hypotension. For SNF placement  #Hypertension, previously quite high during hospitalization nowresolvedwhile off amlodipine. Stable  #E. coli UTI, completed 5 day course of keflex. Patient unable to report any previous urinary symptoms prior to hospital stay but does report urinary frequency while here. Remains stable  #Bilateral lower extremity skin tears, stable. Dressings in place. Continue to monitor. Wound care consulted to assist with dressing changes instructions for when ready for rehab facility  #Paroxysmal atrial flutter, rate controlled. Continue home digoxin. Not on any anticoagulation as outpatient. Remains stable  #Poor nutrition,  chronic. Has been ongoing since prior to admission. Patient has intermittent appetite, likely resulting in presenting orthostatic hypotension. dietician consulted. Supplemental protein shakes as tolerated   Discharge Instructions  Discharge Instructions    Compression stockings   Complete by:  As directed    Diet - low sodium heart healthy   Complete by:  As directed    Increase activity slowly   Complete by:  As directed      Allergies as of 11/19/2018      Reactions   Crestor [rosuvastatin Calcium] Other (See Comments)   "bones ache all over"   Rosuvastatin Other (See Comments)   Bone pain all over body      Medication List    TAKE these medications   acetaminophen 500 MG tablet Commonly known as:  TYLENOL Take 2 tablets (1,000 mg total) by mouth 3 (three) times daily for 10 days. What changed:    how much to take  when to take this  reasons to take this   amLODipine 5 MG tablet Commonly known as:  NORVASC Take 1 tablet (5 mg total) by mouth daily.   aspirin 81 MG EC tablet Take 1 tablet (81 mg total) by mouth daily.   bismuth subsalicylate 262 MG/15ML suspension Commonly known as:  PEPTO BISMOL Take 30 mLs by mouth every 4 (four) hours as needed for indigestion or diarrhea or loose stools.   CALCIUM 600 + D PO Take 1 tablet by mouth daily.   digoxin 0.125 MG tablet Commonly known as:  LANOXIN Take 1 tablet (125 mcg total) by mouth daily.   docusate sodium 100 MG capsule Commonly known as:  COLACE Take 1 capsule (100 mg total) by mouth daily.   famotidine 10 MG tablet Commonly known as:  PEPCID Take 1 tablet (10 mg total) by mouth daily.   lidocaine 5 % Commonly known as:  LIDODERM Place 1 patch onto the skin daily. Remove & Discard patch within 12 hours or as directed by MD  LUMIGAN 0.01 % Soln Generic drug:  bimatoprost Place 1 drop into both eyes at bedtime.   polyethylene glycol packet Commonly known as:  MIRALAX / GLYCOLAX Take 17 g  by mouth daily as needed for mild constipation.   PROBIOTIC ACIDOPHILUS BEADS Caps Take 1 capsule by mouth daily.   traMADol 50 MG tablet Commonly known as:  ULTRAM Take 1 tablet (50 mg total) by mouth every 6 (six) hours as needed for moderate pain.   vitamin B-12 500 MCG tablet Commonly known as:  CYANOCOBALAMIN Take 1 tablet (500 mcg total) by mouth daily.      Follow-up Information    Marden Noble, MD.   Specialty:  Internal Medicine Contact information: [address] [place] [address] [phone]          Allergies  Allergen Reactions  . Crestor [Rosuvastatin Calcium] Other (See Comments)    "bones ache all over"  . Rosuvastatin Other (See Comments)     Bone pain all over body   Procedures/Studies: Dg Ribs Unilateral W/chest Left  Result Date: 11/05/2018 CLINICAL DATA:  Pain after fall. EXAM: LEFT RIBS AND CHEST - 3+ VIEW COMPARISON:  Chest radiograph 12/26/2016 FINDINGS: Frontal view the chest and four views of left-sided ribs. Frontal view of the chest demonstrates midline trachea. Mild cardiomegaly. Atherosclerosis in the transverse aorta. No pleural effusion or pneumothorax. Left rib radiographs demonstrate subtle osseous irregularity involving anterior aspect of lower left ribs, most apparent on the final image. IMPRESSION: Suspect minimally displaced fractures of anterior lower left ribs. Correlate with point tenderness. No evidence of pleural fluid or pneumothorax. Cardiomegaly without congestive failure. Aortic Atherosclerosis (ICD10-I70.0). Electronically Signed   By: Jeronimo Greaves M.D.   On: 11/05/2018 10:32   Dg Lumbar Spine 2-3 Views  Result Date: 11/06/2018 CLINICAL DATA:  Low back pain. Multiple recent falls. EXAM: LUMBAR SPINE - 2-3 VIEW COMPARISON:  08/10/2016. FINDINGS: Non-rib-bearing lumbar vertebrae. Stable T12 through L2 laminectomy defects. Stable 25% L2 superior endplate compression deformity with no significant bony  retropulsion and no acute fracture lines. Stable facet degenerative changes in the mid and lower lumbar spine with associated stable grade 1 anterolisthesis at the L5-S1 level. No pars defects or acute fractures. And inferior vena cava filter is again demonstrated with its tip at the inferior L2 level. IMPRESSION: No acute abnormality. Stable degenerative and postoperative changes. Electronically Signed   By: Beckie Salts M.D.   On: 11/06/2018 18:54   Dg Pelvis 1-2 Views  Result Date: 11/05/2018 CLINICAL DATA:  84 year old female with fall. EXAM: PELVIS - 1-2 VIEW COMPARISON:  Pelvic radiograph dated 12/30/2016 FINDINGS: There is no acute fracture or dislocation. Old right pubic rami fractures. The bones are osteopenic. The soft tissues are unremarkable. Partially visualized IVC filter. IMPRESSION: 1. No acute fracture or dislocation. 2. Old right pubic rami fractures. Electronically Signed   By: Elgie Collard M.D.   On: 11/05/2018 06:31   Dg Tibia/fibula Left  Result Date: 11/05/2018 CLINICAL DATA:  Initial evaluation for acute trauma, fall. EXAM: LEFT TIBIA AND FIBULA - 2 VIEW COMPARISON:  None. FINDINGS: No acute fracture or dislocation. Diffuse osteopenia noted. Mild degenerative changes noted about the knee and ankle. No acute soft tissue abnormality. Vascular calcifications noted. IMPRESSION: No acute osseous abnormality about the left tibia/fibula. Electronically Signed   By: Rise Mu M.D.   On: 11/05/2018 06:32   Ct Pelvis Wo Contrast  Result Date: 11/11/2018 CLINICAL DATA:  Pain after fall. EXAM: CT PELVIS  WITHOUT CONTRAST TECHNIQUE: Multidetector CT imaging of the pelvis was performed following the standard protocol without intravenous contrast. COMPARISON:  X-ray November 05, 2018 FINDINGS: Urinary Tract:  No abnormality visualized. Bowel: Colonic diverticulosis without diverticulitis. Apparent rectal wall thickening with mild increased attenuation in the surrounding fat.  Vascular/Lymphatic: Atherosclerotic change in the aorta and branching vessels. No aneurysm noted. Reproductive:  No mass or other significant abnormality Other:  None. Musculoskeletal: Healed right pubic rami fractures. No hip fractures noted. Degenerative changes in the lower lumbar spine. IMPRESSION: 1. No acute fracture identified. Osteopenia. If concern persists, an MRI would be more sensitive in the evaluation for nondisplaced subtle fractures in the setting of osteopenia. 2. Apparent rectal wall thickening with possible mild increased attenuation in the adjacent fat. This could represent inflammation of the rectum in the appropriate clinical setting. Recommend clinical correlation. Electronically Signed   By: Gerome Sam III M.D   On: 11/11/2018 09:17   Ct Abdomen Pelvis W Contrast  Result Date: 11/15/2018 CLINICAL DATA:  Persistent abdominal pain EXAM: CT ABDOMEN AND PELVIS WITH CONTRAST TECHNIQUE: Multidetector CT imaging of the abdomen and pelvis was performed using the standard protocol following bolus administration of intravenous contrast. CONTRAST:  ISOVUE-300 IOPAMIDOL (ISOVUE-300) INJECTION 61% COMPARISON:  Plain films 11/12/2018.  CT pelvis 11/11/2018. FINDINGS: Lower chest: Moderate bilateral pleural effusions with compressive atelectasis. Heart is mildly enlarged. Hepatobiliary: No suspicious focal hepatic abnormality or ductal dilatation. There is mild indistinctness of the gallbladder wall with slight surrounding stranding/fluid. Can not exclude cholecystitis. No visible stones. Pancreas: No focal abnormality or ductal dilatation. Spleen: No focal abnormality.  Normal size. Adrenals/Urinary Tract: Left renal cysts, the largest 3.1 cm. No hydronephrosis. Adrenal glands and urinary bladder unremarkable. Stomach/Bowel: Severe sigmoid diverticulosis. Large stool burden throughout the colon. No active diverticulitis. Stomach and small bowel decompressed, unremarkable. Normal appendix.  Vascular/Lymphatic: Aortic atherosclerosis. No enlarged abdominal or pelvic lymph nodes. IVC filter in place. Reproductive: Prior hysterectomy.  No adnexal masses. Other: Trace free fluid in the pelvis.  No free air. Musculoskeletal: No acute bony abnormality. IMPRESSION: Indistinctness/haziness of the gallbladder wall with slight surrounding stranding/fluid. Can not exclude cholecystitis. Consider further evaluation with right upper quadrant ultrasound. Extensive sigmoid diverticulosis. No active diverticulitis. Large stool burden within the colon. Aortic atherosclerosis. Moderate bilateral pleural effusions with compressive atelectasis in the lower lobes. Mild cardiomegaly. Electronically Signed   By: Charlett Nose M.D.   On: 11/15/2018 10:11   Mr 3d Recon At Scanner  Result Date: 11/17/2018 CLINICAL DATA:  Common bile duct dilatation. EXAM: MRI ABDOMEN WITHOUT AND WITH CONTRAST (INCLUDING MRCP) TECHNIQUE: Multiplanar multisequence MR imaging of the abdomen was performed both before and after the administration of intravenous contrast. Heavily T2-weighted images of the biliary and pancreatic ducts were obtained, and three-dimensional MRCP images were rendered by post processing. CONTRAST:  5.5 cc Gadavist COMPARISON:  CT scan 11/15/2018 and ultrasound examination 11/16/2018. FINDINGS: Examination is extremely limited due to respiratory motion. Lower chest: Cardiac enlargement is noted. No pericardial effusion. There are moderate-sized bilateral pleural effusions with overlying atelectasis. Hepatobiliary: No focal hepatic lesions. No intrahepatic biliary dilatation. The gallbladder is unremarkable. No gallstones or pericholecystic fluid. Mild common bile duct dilatation may be age related. No obvious common bile duct stones and no ampullary mass or pancreatic head mass. The portal and hepatic veins are patent. Pancreas:  No mass, inflammation or ductal dilatation. Spleen:  Normal size.  No focal lesions.  Adrenals/Urinary Tract: The adrenal glands and kidneys are unremarkable. Left  renal cysts are noted. Stomach/Bowel: The stomach, duodenum, visualized small bowel and visualize colon are grossly normal. Vascular/Lymphatic: The aorta and branch vessels are patent. Moderate atherosclerotic calcifications. An IVC filter is noted. No mesenteric or retroperitoneal mass or adenopathy. Other:  No ascites or abdominal wall hernia. Musculoskeletal: No significant bony findings. IMPRESSION: Very limited examination due to respiratory motion. 1. Cardiac enlargement with moderate-sized bilateral pleural effusions and overlying bibasilar atelectasis. 2. Mild common bile duct dilatation measuring up to 10.5 mm but no evidence of obstruction. This is likely age related. 3. No MR findings to suggest cholelithiasis or acute cholecystitis. 4. No pancreatic mass or inflammation. Electronically Signed   By: Rudie MeyerP.  Gallerani M.D.   On: 11/17/2018 07:17   Dg Knee Complete 4 Views Right  Result Date: 11/05/2018 CLINICAL DATA:  84 year old female with fall and right knee pain. EXAM: RIGHT KNEE - COMPLETE 4+ VIEW COMPARISON:  None. FINDINGS: There is no acute fracture or dislocation. Osteopenia. No arthritic changes. No joint effusion. The soft tissues appear unremarkable. IMPRESSION: No acute fracture or dislocation. Electronically Signed   By: Elgie CollardArash  Radparvar M.D.   On: 11/05/2018 06:32   Dg Abd 2 Views  Result Date: 11/12/2018 CLINICAL DATA:  Abdominal pain, nausea EXAM: ABDOMEN - 2 VIEW COMPARISON:  None. FINDINGS: IVC filter in place. Nonobstructive bowel gas pattern. No organomegaly or free air. No suspicious calcification. Small to moderate left pleural effusion with left lower lobe atelectasis or infiltrate. IMPRESSION: No acute findings in the abdomen. Small to moderate left pleural effusion with left base atelectasis or infiltrate. Electronically Signed   By: Charlett NoseKevin  Dover M.D.   On: 11/12/2018 19:35   Mr Abdomen Mrcp Vivien RossettiW Wo  Contast  Result Date: 11/17/2018 CLINICAL DATA:  Common bile duct dilatation. EXAM: MRI ABDOMEN WITHOUT AND WITH CONTRAST (INCLUDING MRCP) TECHNIQUE: Multiplanar multisequence MR imaging of the abdomen was performed both before and after the administration of intravenous contrast. Heavily T2-weighted images of the biliary and pancreatic ducts were obtained, and three-dimensional MRCP images were rendered by post processing. CONTRAST:  5.5 cc Gadavist COMPARISON:  CT scan 11/15/2018 and ultrasound examination 11/16/2018. FINDINGS: Examination is extremely limited due to respiratory motion. Lower chest: Cardiac enlargement is noted. No pericardial effusion. There are moderate-sized bilateral pleural effusions with overlying atelectasis. Hepatobiliary: No focal hepatic lesions. No intrahepatic biliary dilatation. The gallbladder is unremarkable. No gallstones or pericholecystic fluid. Mild common bile duct dilatation may be age related. No obvious common bile duct stones and no ampullary mass or pancreatic head mass. The portal and hepatic veins are patent. Pancreas:  No mass, inflammation or ductal dilatation. Spleen:  Normal size.  No focal lesions. Adrenals/Urinary Tract: The adrenal glands and kidneys are unremarkable. Left renal cysts are noted. Stomach/Bowel: The stomach, duodenum, visualized small bowel and visualize colon are grossly normal. Vascular/Lymphatic: The aorta and branch vessels are patent. Moderate atherosclerotic calcifications. An IVC filter is noted. No mesenteric or retroperitoneal mass or adenopathy. Other:  No ascites or abdominal wall hernia. Musculoskeletal: No significant bony findings. IMPRESSION: Very limited examination due to respiratory motion. 1. Cardiac enlargement with moderate-sized bilateral pleural effusions and overlying bibasilar atelectasis. 2. Mild common bile duct dilatation measuring up to 10.5 mm but no evidence of obstruction. This is likely age related. 3. No MR  findings to suggest cholelithiasis or acute cholecystitis. 4. No pancreatic mass or inflammation. Electronically Signed   By: Rudie MeyerP.  Gallerani M.D.   On: 11/17/2018 07:17   [place] Abdomen Limited Ruq  Result  Date: 11/16/2018 CLINICAL DATA:  Abdominal pain. Possible pericholecystic stranding on CT. EXAM: ULTRASOUND ABDOMEN LIMITED RIGHT UPPER QUADRANT COMPARISON:  Abdominopelvic CT earlier this day. FINDINGS: Technically challenging exam due to patient condition and inability to breath hold. Gallbladder: Distended with wall thickening at 6 mm. No evident gallstone. No pericholecystic fluid. A positive sonographic Eulah Pont sign is noted by sonographer. Common bile duct: Diameter: Dilated at 10 mm, no visualized choledocholithiasis. Liver: No focal lesion identified. Mildly heterogeneous in parenchymal echogenicity. Portal vein is patent on color Doppler imaging with normal direction of blood flow towards the liver. Right pleural effusion is noted. IMPRESSION: 1. Distended gallbladder and common bile duct dilatation. Gallbladder wall thickening without gallstones. Imaging findings are suspicious for common bile duct obstruction, however patient has normal LFTs. While the test of choice for further evaluation would be MRCP, this would be of limited value in this elderly patient who is unable to breath hold. Consider further evaluation with ERCP. 2. Positive sonographic Murphy sign, which in the setting of gallbladder distention is suspicious for acute acalculous cholecystitis. Electronically Signed   By: Narda Rutherford M.D.   On: 11/16/2018 02:07     Subjective: Eager to start therapy  Discharge Exam: Vitals:   11/18/18 1953 11/19/18 0537  BP: 117/75 (!) 169/86  Pulse: (!) 59 60  Resp: 17 18  Temp: 98 F (36.7 C) 97.9 F (36.6 C)  SpO2: 95% 96%   Vitals:   11/18/18 1528 11/18/18 1953 11/19/18 0500 11/19/18 0537  BP: 138/74 117/75  (!) 169/86  Pulse: 60 (!) 59  60  Resp:  17  18  Temp: 98.3 F (36.8  C) 98 F (36.7 C)  97.9 F (36.6 C)  TempSrc: Oral Oral  Oral  SpO2: 96% 95%  96%  Weight:   54.5 kg 54.6 kg  Height:        General: Pt is alert, awake, not in acute distress Cardiovascular: RRR, S1/S2 +, no rubs, no gallops Respiratory: CTA bilaterally, no wheezing, no rhonchi Abdominal: Soft, NT, ND, bowel sounds + Extremities: no edema, no cyanosis   The results of significant diagnostics from this hospitalization (including imaging, microbiology, ancillary and laboratory) are listed below for reference.     Microbiology: No results found for this or any previous visit (from the past 240 hour(s)).   Labs: BNP (last 3 results) Recent Labs    11/15/18 0347  BNP 332.7*   Basic Metabolic Panel: Recent Labs  Lab 11/14/18 0318 11/15/18 0347 11/16/18 0215 11/17/18 0204 11/18/18 0327 11/19/18 0607  NA 141 140 140 140 143  --   K 3.8 3.8 3.6 4.2 3.5  --   CL 106 106 107 105 106  --   CO2 25 26 26 27 28   --   GLUCOSE 94 96 93 97 88  --   BUN 15 14 12 13 9   --   CREATININE 0.73 0.76 0.70 0.65 0.75 0.65  CALCIUM 8.5* 8.2* 8.2* 8.4* 8.4*  --    Liver Function Tests: Recent Labs  Lab 11/15/18 0347 11/17/18 0204 11/18/18 0327  AST 16 19 20   ALT 19 21 18   ALKPHOS 62 66 56  BILITOT 0.5 0.5 0.5  PROT 5.3* 5.3* 5.2*  ALBUMIN 2.4* 2.5* 2.4*   No results for input(s): LIPASE, AMYLASE in the last 168 hours. No results for input(s): AMMONIA in the last 168 hours. CBC: Recent Labs  Lab 11/14/18 0318 11/15/18 0347 11/16/18 0215 11/17/18 0204 11/18/18 0327  WBC 8.5  9.0 8.6 7.8 5.3  HGB 12.4 11.9* 11.9* 12.0 11.3*  HCT 38.7 37.7 36.9 36.3 35.9*  MCV 101.0* 101.6* 100.3* 99.7 100.6*  PLT 220 256 251 288 309   Cardiac Enzymes: No results for input(s): CKTOTAL, CKMB, CKMBINDEX, TROPONINI in the last 168 hours. BNP: Invalid input(s): POCBNP CBG: No results for input(s): GLUCAP in the last 168 hours. D-Dimer No results for input(s): DDIMER in the last 72  hours. Hgb A1c No results for input(s): HGBA1C in the last 72 hours. Lipid Profile No results for input(s): CHOL, HDL, LDLCALC, TRIG, CHOLHDL, LDLDIRECT in the last 72 hours. Thyroid function studies No results for input(s): TSH, T4TOTAL, T3FREE, THYROIDAB in the last 72 hours.  Invalid input(s): FREET3 Anemia work up No results for input(s): VITAMINB12, FOLATE, FERRITIN, TIBC, IRON, RETICCTPCT in the last 72 hours. Urinalysis    Component Value Date/Time   COLORURINE YELLOW 11/05/2018 0939   APPEARANCEUR CLOUDY (A) 11/05/2018 0939   LABSPEC 1.018 11/05/2018 0939   PHURINE 5.0 11/05/2018 0939   GLUCOSEU NEGATIVE 11/05/2018 0939   HGBUR MODERATE (A) 11/05/2018 0939   BILIRUBINUR NEGATIVE 11/05/2018 0939   KETONESUR 5 (A) 11/05/2018 0939   PROTEINUR 30 (A) 11/05/2018 0939   UROBILINOGEN 0.2 01/23/2015 0959   NITRITE POSITIVE (A) 11/05/2018 0939   LEUKOCYTESUR TRACE (A) 11/05/2018 0939   Sepsis Labs Invalid input(s): PROCALCITONIN,  WBC,  LACTICIDVEN Microbiology No results found for this or any previous visit (from the past 240 hour(s)).  Time spent: 30 min  SIGNED:   Rickey Barbara, MD  [facility] 11/19/2018, 10:41 AM  If 7PM-7AM, please contact night-coverage

## 2018-12-21 ENCOUNTER — Ambulatory Visit: Payer: Medicare Other | Admitting: Podiatry

## 2018-12-23 ENCOUNTER — Telehealth: Payer: Self-pay | Admitting: Internal Medicine

## 2018-12-23 NOTE — Telephone Encounter (Signed)
Call was sent straight to Triage. Dana with Home Health calling about patient complaining of dizziness, lightheadedness, and positive for orthostatic hypotension. Annabelle Harman also stated patient is in A. FIB with HR 83. Per Dr. Tenny Craw' note patient has a diagnosis of chronic atrial fib.  Annabelle Harman thinks patient needs a digoxin level checked. Patient is currently only taking digoxin, not amlodipine (will update patient's medication list). Annabelle Harman was hard to hear, connection was muffled. Annabelle Harman insisted that a doctor address this right away. Dr. Tenny Craw is not in the office today, but offered to talk to DOD, Dr. Anne Fu. Dr. Anne Fu stated patient did not need a digoxin level, but to increase patient's fluids and give her a small salty snack. Also made patient an appointment on Monday with Dr. Tenny Craw to be evaluated. Will forward to Dr. Tenny Craw and her nurse.

## 2018-12-23 NOTE — Telephone Encounter (Signed)
New message      Pt c/o BP issue: STAT if pt c/o blurred vision, one-sided weakness or slurred speech  1. What are your last 5 BP readings? 129/80 sitting , 80/59 standing, prior to medications  160/88, lunch 103/90,  2. Are you having any other symptoms (ex. Dizziness, headache, blurred vision, passed out)?dizziness and lightheadedness  3. What is your BP issue? [name] Weber from [facility] stated that she concerned about pt b/p and symptoms

## 2018-12-26 ENCOUNTER — Encounter (INDEPENDENT_AMBULATORY_CARE_PROVIDER_SITE_OTHER): Payer: Self-pay

## 2018-12-26 ENCOUNTER — Other Ambulatory Visit: Payer: Self-pay | Admitting: Internal Medicine

## 2018-12-26 ENCOUNTER — Ambulatory Visit: Payer: Medicare Other | Admitting: Internal Medicine

## 2018-12-26 ENCOUNTER — Encounter: Payer: Self-pay | Admitting: Internal Medicine

## 2018-12-26 VITALS — BP 140/82 | HR 73 | Ht 68.0 in | Wt 108.4 lb

## 2018-12-26 DIAGNOSIS — I951 Orthostatic hypotension: Secondary | ICD-10-CM | POA: Diagnosis not present

## 2018-12-26 DIAGNOSIS — R42 Dizziness and giddiness: Secondary | ICD-10-CM

## 2018-12-26 DIAGNOSIS — I4819 Other persistent atrial fibrillation: Secondary | ICD-10-CM | POA: Diagnosis not present

## 2018-12-26 NOTE — Progress Notes (Signed)
Cardiology Office Note   Date:  12/26/2018   ID:  [name] Weber, DOB 06-20-1931, MRN [id number]  PCP:  Marden Noble, MD  Cardiologist:   Dietrich Pates, MD   F/U of atrial ifb      History of Present Illness: [name] Weber is a 85 y.o. female with a history of myelopathy, PAF, HL and spinal bleed   Seen by J allred in past for consideration of  watchman as well as Dr Andrey Farmer at [facility]  Pt declined procedure  Does not want anticoagulation  Have reviewed risks in past  I saw the pt back in Feb 2019    She was recently hospitalized after fall   Spent time in [facility]    She still is weak at times   BP from home is up and down   At home recordings are 110 to 180   These were checked by [facility]   Couple orthostatic checks: 182/111 laying then 107/81 standing   Alos 166/99   Standing 112/47  The pt denies CP   No palpitations Neighbor is with her today       No outpatient medications have been marked as taking for the 12/26/18 encounter (Office Visit) with Pricilla Riffle, MD.     Allergies:   Crestor [rosuvastatin calcium] and Rosuvastatin   Past Medical History:  Diagnosis Date  . Fatigue   . Malaise   . Myalgia   . PAC (premature atrial contraction)    ISOLATED  . Palpitations    OCCASSIONAL  . Paroxysmal atrial flutter (HCC)   . PVC's (premature ventricular contractions)    ISOLATED    Past Surgical History:  Procedure Laterality Date  . THORACIC LAMINECTOMY FOR EPIDURAL ABSCESS N/A 11/16/2014   Procedure: THORACIC LAMINECTOMY FOR EPIDURAL ABSCESS;  Surgeon: Karn Cassis, MD;  Location: [facility];  Service: Neurosurgery;  Laterality: N/A;  . VULVAR LESION REMOVAL  01/01/2009   She had a 1-cm area of erythema to the right of the urethral meatus     Social History:  The patient  reports that she quit smoking about 27 years ago. She has quit using smokeless tobacco. She reports that she does not drink alcohol or use drugs.   Family History:  The patient's  family history includes Hyperlipidemia in her mother.    ROS:  Please see the history of present illness. All other systems are reviewed and  Negative to the above problem except as noted.    PHYSICAL EXAM: VS:  Ht 5\' 8"  (1.727 m)   Wt 108 lb 6.4 oz (49.2 kg)   BMI 16.48 kg/m   GEN: Thin 85 yo in no acute distress  HEENT: normal  Neck: JVP not elevated No carotid bruits, or masses Cardiac: Irreg irreg  ; no murmurs, rubs, or gallops,no edema  Respiratory:  clear to auscultation bilaterally, normal work of breathing GI: soft, nontender, nondistended, + BS  No hepatomegaly  MS: Wearing braces on legs   Skin: warm and dry, no rash Neuro:  Strength and sensation are intact Psych: euthymic mood, full affect   EKG   Afib  73 bpm   Occasional PVC  Septal MI   LAD   Lipid Panel    Component Value Date/Time   CHOL 230 (H) 02/18/2012 0937   TRIG 50.0 02/18/2012 0937   HDL 87.90 02/18/2012 0937   CHOLHDL 3 02/18/2012 0937   VLDL 10.0 02/18/2012 0937   LDLDIRECT 126.0 02/18/2012 0240  Wt Readings from Last 3 Encounters:  12/26/18 108 lb 6.4 oz (49.2 kg)  11/19/18 120 lb 4.8 oz (54.6 kg)  01/03/18 105 lb 6.4 oz (47.8 kg)      ASSESSMENT AND PLAN:  1  Chronic atrial fib   CHADSVASc 3 Remains in atrial fibrillation  Rates OK   Not on anticoag  2  Orthostasis     Check today is positive with sitting   COuldnt stand I encouraged her to increase fluid intake   Will check cortisol, TSH   Also UA for specific gravity      3   Dispo   I think the pt should either be in an assisted living vs continue with 24 hour care   She is at a very high risk for fall   Current medicines are reviewed at length with the patient today.  The patient does not have concerns regarding medicines.  Signed, Dietrich Pates, MD  12/26/2018 12:02 PM    [facility] [address] [place], [place], [place]  [address] Phone: [phone]; Fax: [phone]

## 2018-12-26 NOTE — Patient Instructions (Signed)
Medication Instructions:  No change  If you need a refill on your cardiac medications before your next appointment, please call your pharmacy.   Lab work: Today: digoxin, bmet, cortisol, tsh, and urinalysis  If you have labs (blood work) drawn today and your tests are completely normal, you will receive your results only by: Marland Kitchen MyChart Message (if you have MyChart) OR . A paper copy in the mail If you have any lab test that is abnormal or we need to change your treatment, we will call you to review the results.  Testing/Procedures: none  Any Other Special Instructions Will Be Listed Below (If Applicable).

## 2018-12-27 ENCOUNTER — Telehealth: Payer: Self-pay | Admitting: *Deleted

## 2018-12-27 DIAGNOSIS — R8290 Unspecified abnormal findings in urine: Secondary | ICD-10-CM

## 2018-12-27 LAB — URINALYSIS
Bilirubin, UA: NEGATIVE
Glucose, UA: NEGATIVE
Ketones, UA: NEGATIVE
Nitrite, UA: POSITIVE — AB
Protein, UA: NEGATIVE
RBC, UA: NEGATIVE
Specific Gravity, UA: 1.009 (ref 1.005–1.030)
Urobilinogen, Ur: 0.2 mg/dL (ref 0.2–1.0)
pH, UA: 5.5 (ref 5.0–7.5)

## 2018-12-27 LAB — BASIC METABOLIC PANEL
BUN/Creatinine Ratio: 18 (ref 12–28)
BUN: 13 mg/dL (ref 8–27)
CO2: 23 mmol/L (ref 20–29)
Calcium: 9.5 mg/dL (ref 8.7–10.3)
Chloride: 99 mmol/L (ref 96–106)
Creatinine, Ser: 0.73 mg/dL (ref 0.57–1.00)
GFR calc Af Amer: 87 mL/min/{1.73_m2} (ref >59)
GFR calc non Af Amer: 75 mL/min/{1.73_m2} (ref >59)
Glucose: 95 mg/dL (ref 65–99)
Potassium: 4.4 mmol/L (ref 3.5–5.2)
Sodium: 138 mmol/L (ref 134–144)

## 2018-12-27 LAB — TSH: TSH: 1.34 u[IU]/mL (ref 0.450–4.500)

## 2018-12-27 LAB — DIGOXIN LEVEL: Digoxin, Serum: 0.6 ng/mL (ref 0.5–0.9)

## 2018-12-27 LAB — CORTISOL: Cortisol: 12.8 ug/dL

## 2018-12-27 NOTE — Telephone Encounter (Signed)
Left detailed message on VM (DPR) Vi Appleton's.  Adv that patient will need to have repeat urine sample so that a culture can be sent, based on ua results.  Adv to call office to let us know what she does - can come to our office or go to the [facility] down stairs of our building or go to PCP office.  Order in Epic for urine C & S.

## 2018-12-28 ENCOUNTER — Telehealth: Payer: Self-pay | Admitting: *Deleted

## 2018-12-28 ENCOUNTER — Other Ambulatory Visit: Payer: Medicare Other | Admitting: *Deleted

## 2018-12-28 DIAGNOSIS — R8290 Unspecified abnormal findings in urine: Secondary | ICD-10-CM

## 2018-12-28 NOTE — Progress Notes (Signed)
12/28/2018 Pt unable to provided specimen. Pt sent home with collection container, instructed to bring back this afternoon or in the morning.

## 2018-12-28 NOTE — Telephone Encounter (Signed)
Follow up   Patient  Is returning call.

## 2018-12-28 NOTE — Telephone Encounter (Signed)
Pt unable to provide urine specimen.  Pt was given urine collection container,  Instructed to return urine this afternoon or in the morning.

## 2018-12-28 NOTE — Telephone Encounter (Signed)
Saw patient in lobby.  She was here to provide urine specimen.  The personal care assistant with her reported she is dizzy.  The patient was in a wheelchair and had her walker with her.  She reported to me the dizziness is the same as when Dr. Tenny Craw saw her on Monday (2 days ago) and she is trying to drink more fluids.

## 2019-01-01 LAB — URINE CULTURE

## 2019-01-02 MED ORDER — SULFAMETHOXAZOLE-TRIMETHOPRIM 800-160 MG PO TABS: 1.0000 | ORAL_TABLET | Freq: Two times a day (BID) | ORAL | 0 refills | Status: AC

## 2019-01-02 NOTE — Telephone Encounter (Signed)
Replied to patient's email and also called pt's guardian (vSedonia Small, [place]) to inform.   Notes recorded by Pricilla Riffle, MD on 01/02/2019 at 1:37 PM EST Urine shows ecoli Would give bactrim DS  1 tab BID for 5 days

## 2019-01-04 ENCOUNTER — Ambulatory Visit: Payer: Medicare Other | Admitting: Podiatry

## 2019-01-30 ENCOUNTER — Ambulatory Visit: Payer: Medicare Other | Admitting: Internal Medicine

## 2019-02-08 ENCOUNTER — Ambulatory Visit: Payer: Medicare Other | Admitting: Neurology

## 2019-03-12 ENCOUNTER — Other Ambulatory Visit: Payer: Self-pay | Admitting: Internal Medicine

## 2019-04-27 ENCOUNTER — Telehealth: Payer: Self-pay | Admitting: Internal Medicine

## 2019-04-27 NOTE — Telephone Encounter (Signed)
    COVID-19 Pre-Screening Questions:  . In the past 7 to 10 days have you had a cough,  shortness of breath, headache, congestion, fever (100 or greater) body aches, chills, sore throat, or sudden loss of taste or sense of smell? no . Have you been around anyone with known Covid 19.-no . Have you been around anyone who is awaiting Covid 19 test results in the past 7 to 10 days? no . Have you been around anyone who has been exposed to Covid 19, or has mentioned symptoms of Covid 19 within the past 7 to 10 days? no    Screening questions answered by pt's guardian.  She is aware pt and anyone coming into office needs to wear a mask.  Guardian will bring pt to visit and feels she needs to be with pt during visit as pt is unsteady and gets confused at times.

## 2019-04-27 NOTE — Telephone Encounter (Signed)
Patient should be seen in clinic   WIll need physical exam and labs   Can she be added in in next seveal days?

## 2019-04-27 NOTE — Telephone Encounter (Signed)
I spoke with pt's guardian. She reports pt told her she has not been feeling good.  Pt was not able to give specific complaints. This has been going on for a couple of weeks.  Guardian reports pt does not have good fluid intake. Trouble keeping legs elevated when sitting. Guardian has noticed swelling in left ankle for a few weeks at times. Swelling is minimal and today she is not having any swelling. Was present 2 nights ago. Some dizziness in the morning but none in last week. No shortness of breath or cough. Guardian is not with pt all the time as pt lives alone. Had UTI about 6 weeks ago which was treated by primary care.  They have not discussed current complaints with primary care.  Guardian is concerned swelling and complaints of not feeling well could be heart related. Will forward to Dr. Tenny Craw for review.

## 2019-04-27 NOTE — Telephone Encounter (Signed)
Appointment is June 1,2020 at 1:40 with Dr. Tenny Craw

## 2019-04-27 NOTE — Telephone Encounter (Signed)
Dr. Tenny Craw you are DOD next Monday June 1.  Would that be ok to add her to your DOD clinic?

## 2019-04-27 NOTE — Telephone Encounter (Signed)
New Message    Pt c/o swelling: STAT is pt has developed SOB within 24 hours  1) How much weight have you gained and in what time span? Not sure   2) If swelling, where is the swelling located? Left Ankle has swelling and pt is not feeling well   3) Are you currently taking a fluid pill? No   4) Are you currently SOB? No   5) Do you have a log of your daily weights (if so, list)? No   6) Have you gained 3 pounds in a day or 5 pounds in a week? Not sure   7) Have you traveled recently? No

## 2019-05-01 ENCOUNTER — Ambulatory Visit (INDEPENDENT_AMBULATORY_CARE_PROVIDER_SITE_OTHER): Payer: Medicare Other | Admitting: Internal Medicine

## 2019-05-01 ENCOUNTER — Encounter: Payer: Self-pay | Admitting: Internal Medicine

## 2019-05-01 ENCOUNTER — Other Ambulatory Visit: Payer: Self-pay

## 2019-05-01 VITALS — BP 129/78 | HR 79 | Ht 68.0 in | Wt 106.2 lb

## 2019-05-01 DIAGNOSIS — I10 Essential (primary) hypertension: Secondary | ICD-10-CM

## 2019-05-01 DIAGNOSIS — E782 Mixed hyperlipidemia: Secondary | ICD-10-CM | POA: Diagnosis not present

## 2019-05-01 DIAGNOSIS — I4891 Unspecified atrial fibrillation: Secondary | ICD-10-CM

## 2019-05-01 DIAGNOSIS — R8290 Unspecified abnormal findings in urine: Secondary | ICD-10-CM | POA: Diagnosis not present

## 2019-05-01 DIAGNOSIS — M549 Dorsalgia, unspecified: Secondary | ICD-10-CM | POA: Diagnosis not present

## 2019-05-01 NOTE — Patient Instructions (Signed)
Medication Instructions:  No changes If you need a refill on your cardiac medications before your next appointment, please call your pharmacy.   LAB: Blood work and urine --bmet, cbc, lipids, tsh, bnp  and ua, c and s (urine) If you have labs (blood work) drawn today and your tests are completely normal, you will receive your results only by: Marland Kitchen MyChart Message (if you have MyChart) OR . A paper copy in the mail If you have any lab test that is abnormal or we need to change your treatment, we will call you to review the results.  Testing/Procedures: none  Any Other Special Instructions Will Be Listed Below (If Applicable).

## 2019-05-01 NOTE — Progress Notes (Signed)
Cardiology Office Note   Date:  05/01/2019   ID:  [name] Weber, DOB 09/25/1931, MRN [id number]  PCP:  Marden NobleGates, Robert, MD  Cardiologist:   Dietrich PatesPaula Khyli Swaim, MD   F/U of atrial ifb      History of Present Illness: [name] Weber is a 85 y.o. female with a history of myelopathy, PAF, HL and spinal bleed   Seen by J Allred in past for consideration of  Watchman as well [facility]  Pt declined procedure  Pt has refused  Anticoagulation as well because of bleed Have reviewed risks in past  The patient also has a history of labile HTN  ing 112/47  I saw the pt in Jan 2020     Since seen she denies dizziness  No CP  No palpitations   Does have some LE edema     Current Meds  Medication Sig  . aspirin 81 MG EC tablet Take 1 tablet (81 mg total) by mouth daily.  Marland Kitchen. bismuth subsalicylate (PEPTO BISMOL) 262 MG/15ML suspension Take 30 mLs by mouth every 4 (four) hours as needed for indigestion or diarrhea or loose stools.  . Calcium Carb-Cholecalciferol (CALCIUM 600 + D PO) Take 1 tablet by mouth daily.   . digoxin (LANOXIN) 0.125 MG tablet TAKE 1 TABLET BY MOUTH EVERY DAY  . docusate sodium (COLACE) 100 MG capsule Take 1 capsule (100 mg total) by mouth daily. (Patient taking differently: Take 100 mg by mouth every other day. )  . LUMIGAN 0.01 % SOLN Place 1 drop into both eyes at bedtime.  . polyethylene glycol (MIRALAX / GLYCOLAX) packet Take 17 g by mouth daily as needed for mild constipation.  . Probiotic Product (PROBIOTIC ACIDOPHILUS BEADS) CAPS Take 1 capsule by mouth daily.  . vitamin B-12 (CYANOCOBALAMIN) 500 MCG tablet Take 1 tablet (500 mcg total) by mouth daily.     Allergies:   Crestor [rosuvastatin calcium] and Rosuvastatin   Past Medical History:  Diagnosis Date  . Fatigue   . Malaise   . Myalgia   . PAC (premature atrial contraction)    ISOLATED  . Palpitations    OCCASSIONAL  . Paroxysmal atrial flutter (HCC)   . PVC's (premature ventricular contractions)     ISOLATED    Past Surgical History:  Procedure Laterality Date  . THORACIC LAMINECTOMY FOR EPIDURAL ABSCESS N/A 11/16/2014   Procedure: THORACIC LAMINECTOMY FOR EPIDURAL ABSCESS;  Surgeon: Karn CassisErnesto M Botero, MD;  Location: [facility];  Service: Neurosurgery;  Laterality: N/A;  . VULVAR LESION REMOVAL  01/01/2009   She had a 1-cm area of erythema to the right of the urethral meatus     Social History:  The patient  reports that she quit smoking about 28 years ago. She has quit using smokeless tobacco. She reports that she does not drink alcohol or use drugs.   Family History:  The patient's family history includes Hyperlipidemia in her mother.    ROS:  Please see the history of present illness. All other systems are reviewed and  Negative to the above problem except as noted.    PHYSICAL EXAM: VS:  BP 129/78   Pulse 79   Ht 5\' 8"  (1.727 m)   Wt 106 lb 3.2 oz (48.2 kg)   BMI 16.15 kg/m   GEN: Thin 85 yo in no acute distress  HEENT: normal  Neck: JVP minimally increased   No carotid bruits, or masses Cardiac: Irreg irreg  ; no  murmurs, rubs, or gallops,no edema  Respiratory:  clear to auscultation bilaterally, normal work of breathing GI: soft, nontender, nondistended, + BS  No hepatomegaly  MS: Prominent veins   Skin: warm and dry, no rash Neuro:  Strength and sensation are intact Psych: euthymic mood, full affect   EKG   Afib  79 bpm   Occasional PVC  Septal MI   LAD   Lipid Panel    Component Value Date/Time   CHOL 230 (H) 02/18/2012 0937   TRIG 50.0 02/18/2012 0937   HDL 87.90 02/18/2012 0937   CHOLHDL 3 02/18/2012 0937   VLDL 10.0 02/18/2012 0937   LDLDIRECT 126.0 02/18/2012 0937      Wt Readings from Last 3 Encounters:  05/01/19 106 lb 3.2 oz (48.2 kg)  12/26/18 108 lb 6.4 oz (49.2 kg)  11/19/18 120 lb 4.8 oz (54.6 kg)      ASSESSMENT AND PLAN:  1  Chronic atrial fib   CHADSVASc 3 Remains in atrial fibrillation  Rates are controlled  Not on  anticoagulation.  Pt has refused due to hx of bleed     2  Edema  Intermitt   I think most likely related to sodium intaje   Will check BNP    3   Lipids   Check  Lipid panel    4   HTN   Follow   Labile in past  5   Back discomfort   PT has had a history of UTIs  WIll check UA and urine culture     Current medicines are reviewed at length with the patient today.  The patient does not have concerns regarding medicines.  Signed, Dietrich Pates, MD  05/01/2019 1:54 PM    [facility] [address] [place], [place], [place]  [address] Phone: [phone]; Fax: [phone]

## 2019-05-02 LAB — LIPID PANEL
Chol/HDL Ratio: 2.5 ratio (ref 0.0–4.4)
Cholesterol, Total: 211 mg/dL — ABNORMAL HIGH (ref 100–199)
HDL: 85 mg/dL (ref >39)
LDL Calculated: 108 mg/dL — ABNORMAL HIGH (ref 0–99)
Triglycerides: 90 mg/dL (ref 0–149)
VLDL Cholesterol Cal: 18 mg/dL (ref 5–40)

## 2019-05-02 LAB — URINALYSIS
Bilirubin, UA: NEGATIVE
Glucose, UA: NEGATIVE
Ketones, UA: NEGATIVE
Nitrite, UA: NEGATIVE
Specific Gravity, UA: 1.022 (ref 1.005–1.030)
Urobilinogen, Ur: 0.2 mg/dL (ref 0.2–1.0)
pH, UA: 6.5 (ref 5.0–7.5)

## 2019-05-02 LAB — CBC
Hematocrit: 45.5 % (ref 34.0–46.6)
Hemoglobin: 15.6 g/dL (ref 11.1–15.9)
MCH: 32.8 pg (ref 26.6–33.0)
MCHC: 34.3 g/dL (ref 31.5–35.7)
MCV: 96 fL (ref 79–97)
Platelets: 208 10*3/uL (ref 150–450)
RBC: 4.75 x10E6/uL (ref 3.77–5.28)
RDW: 11.8 % (ref 11.7–15.4)
WBC: 7 10*3/uL (ref 3.4–10.8)

## 2019-05-02 LAB — BASIC METABOLIC PANEL
BUN/Creatinine Ratio: 23 (ref 12–28)
BUN: 18 mg/dL (ref 8–27)
CO2: 27 mmol/L (ref 20–29)
Calcium: 9.4 mg/dL (ref 8.7–10.3)
Chloride: 99 mmol/L (ref 96–106)
Creatinine, Ser: 0.78 mg/dL (ref 0.57–1.00)
GFR calc Af Amer: 80 mL/min/{1.73_m2} (ref >59)
GFR calc non Af Amer: 70 mL/min/{1.73_m2} (ref >59)
Glucose: 108 mg/dL — ABNORMAL HIGH (ref 65–99)
Potassium: 4.5 mmol/L (ref 3.5–5.2)
Sodium: 141 mmol/L (ref 134–144)

## 2019-05-02 LAB — PRO B NATRIURETIC PEPTIDE: NT-Pro BNP: 1407 pg/mL — ABNORMAL HIGH (ref 0–738)

## 2019-05-02 LAB — TSH: TSH: 2.63 u[IU]/mL (ref 0.450–4.500)

## 2019-05-03 ENCOUNTER — Telehealth (HOSPITAL_COMMUNITY): Payer: Self-pay | Admitting: *Deleted

## 2019-05-03 DIAGNOSIS — Z79899 Other long term (current) drug therapy: Secondary | ICD-10-CM

## 2019-05-03 MED ORDER — SULFAMETHOXAZOLE-TRIMETHOPRIM 800-160 MG PO TABS: 1.0000 | ORAL_TABLET | Freq: Two times a day (BID) | ORAL | 0 refills | Status: DC

## 2019-05-03 MED ORDER — FUROSEMIDE 20 MG PO TABS: 20.0000 mg | ORAL_TABLET | ORAL | 3 refills | Status: DC

## 2019-05-03 NOTE — Telephone Encounter (Signed)
Jessica from [facility] calling re: lasix prescription. Severe potential interaction with digoxin. I adv would review w Dr. Tenny Craw and we would call her back.

## 2019-05-03 NOTE — Telephone Encounter (Signed)
Reviewed with patient's legal guardian (Vi Appleton). Furosemide of 20 mg once a week and Bactrim DS twice a day for 3 days to [facility]. Pt's daughter wonders about freq of UTI's, just had one in March.  Difficult to determine pt is symptomatic.  Question if recheck of urine should be considered.  Will check with Dr. Tenny Craw.

## 2019-05-03 NOTE — Telephone Encounter (Signed)
-----   Message from Pricilla Riffle, MD sent at 05/03/2019 12:43 PM EDT ----- CBC is OK Thyroid function is normal Kidney function is OK Fluid is up   Urine with evid of UTI   Still waiting of full culture resuts  REcom: 1  Watch salt 2  Can try lasix 1x per week 20 mg, esp with edema 3   Bactrim DS  1 tab bid for 3 days  (discussed with M Supple)

## 2019-05-04 NOTE — Telephone Encounter (Signed)
Very small dose of lasix    1x per week   On day take:   Glass OK or tomato  F?U BMET and Dig level in 2 wks

## 2019-05-04 NOTE — Telephone Encounter (Signed)
I spoke to Vi, caregiver for patient with Dr Tenny Craw' recommendations.  We arranged labs for 6/22.

## 2019-05-05 LAB — URINE CULTURE

## 2019-05-09 ENCOUNTER — Telehealth: Payer: Self-pay | Admitting: Internal Medicine

## 2019-05-09 NOTE — Telephone Encounter (Signed)
Patient wrote in and said that pharmacy would not fill Fx for Lasix 20 mg 1x per week   COntraindicated with current meds (? Digoxin)  I think with intermitt use that I directed for the lasix  it should be OK   FLuiid is up when labs checked  Need to check with pharmacy

## 2019-05-09 NOTE — Telephone Encounter (Signed)
Ok to take Lasix - there's a risk of digoxin toxicity if her K or Mg drop too low - 1 week of Lasix should be fine and her K was checked last week and was 4.5.

## 2019-05-11 NOTE — Telephone Encounter (Signed)
Called and spoke with [facility]. Adv it is okay to fill Lasix.   Pt coming for labs, including digoxin level on 05/22/19. Pharmacy will notify pt that medication is ready.

## 2019-05-15 NOTE — Telephone Encounter (Signed)
Error

## 2019-05-22 ENCOUNTER — Other Ambulatory Visit: Payer: Medicare Other

## 2019-05-22 ENCOUNTER — Telehealth: Payer: Self-pay | Admitting: Internal Medicine

## 2019-05-22 NOTE — Telephone Encounter (Signed)
Called and spoke to Vi. Patient has had swelling in her lower extremities. She states that the patient was prescribed lasix 20 mg once a week and had trouble getting it due to potential interaction with digoxin (see prior phone notes). She states that she took one dose last Sunday and again yesterday. She states that there are some dark discolorations on her legs. She denies pain, SOB, chest pain, or any other Sx. She states that she has been elevating her legs. She does not have a scale to weigh herself. She states that patient feels tired all of the time. She states that she was suppose to have labs rechecked but the appt has been cancelled. She is requesting an appointment for patient to be seen in the office since she feels like the patient is getting worse. Appointment made for tomorrow with Ermalinda Barrios, PA. We will determine which labs need to be rechecked at that time. Discussed avoiding salt and elevated legs to help with swelling.      Patient's caretaker Vi, will need to come up with the patient.    COVID-19 Pre-Screening Questions:  . In the past 7 to 10 days have you had a cough,  shortness of breath, headache, congestion, fever (100 or greater) body aches, chills, sore throat, or sudden loss of taste or sense of smell? NO . Have you been around anyone with known Covid 19? NO . Have you been around anyone who is awaiting Covid 19 test results in the past 7 to 10 days? NO . Have you been around anyone who has been exposed to Covid 19, or has mentioned symptoms of Covid 19 within the past 7 to 10 days? NO

## 2019-05-22 NOTE — Telephone Encounter (Signed)
New Message   Pt c/o swelling: STAT is pt has developed SOB within 24 hours  1) How much weight have you gained and in what time span? Unknown patient does not weigh her self daily  2) If swelling, where is the swelling located? Feet, ankles. Also states that the shins are turning black   3) Are you currently taking a fluid pill? yes  4) Are you currently SOB? no  5) Do you have a log of your daily weights (if so, list)? no  6) Have you gained 3 pounds in a day or 5 pounds in a week? unknown  7) Have you traveled recently? no

## 2019-05-22 NOTE — Telephone Encounter (Signed)
New Message  Pt c/o swelling: STAT is pt has developed SOB within 24 hours  1) How much weight have you gained and in what time span? no  2) If swelling, where is the swelling located? Feet, ankles and shins. The shins are turning black looking   3) Are you currently taking a fluid pill? yes  4) Are you currently SOB? no  5) Do you have a log of your daily weights (if so, list)? no  6) Have you gained 3 pounds in a day or 5 pounds in a week? unknown  7) Have you traveled recently? no

## 2019-05-23 ENCOUNTER — Encounter: Payer: Self-pay | Admitting: Physician Assistant

## 2019-05-23 ENCOUNTER — Other Ambulatory Visit: Payer: Self-pay

## 2019-05-23 ENCOUNTER — Ambulatory Visit (INDEPENDENT_AMBULATORY_CARE_PROVIDER_SITE_OTHER): Payer: Medicare Other | Admitting: Physician Assistant

## 2019-05-23 VITALS — BP 110/72 | HR 66 | Ht 68.0 in | Wt 109.1 lb

## 2019-05-23 DIAGNOSIS — I5032 Chronic diastolic (congestive) heart failure: Secondary | ICD-10-CM

## 2019-05-23 DIAGNOSIS — I4891 Unspecified atrial fibrillation: Secondary | ICD-10-CM

## 2019-05-23 DIAGNOSIS — R609 Edema, unspecified: Secondary | ICD-10-CM | POA: Diagnosis not present

## 2019-05-23 DIAGNOSIS — I10 Essential (primary) hypertension: Secondary | ICD-10-CM | POA: Diagnosis not present

## 2019-05-23 DIAGNOSIS — Z87440 Personal history of urinary (tract) infections: Secondary | ICD-10-CM

## 2019-05-23 HISTORY — DX: Chronic diastolic (congestive) heart failure: I50.32

## 2019-05-23 NOTE — Progress Notes (Signed)
Cardiology Office Note    Date:  05/23/2019   ID:  [name] Weber, DOB 27-Apr-1931, MRN [id number]  PCP:  Josetta Huddle, MD  Cardiologist: Dorris Carnes, MD EPS: None  Chief Complaint  Patient presents with  . Leg Swelling    History of Present Illness:  [name] Weber is a 85 y.o. female with history of HTN,  PAF-chadsvasc equals 3, HLD, spinal bleed, myelopathy. She was considered for a Watchman by Dr. Rayann Heman and Denman George at [facility] but declined and has refused anticoagulation because of bleed.orthostatic hypotension 11/2018, high fall risk. Echo 10/2018 normal LVEF 60-65% severely dilated RA & RV  Patient called in complaining of lower extremity edema. She had been to the beach for a few days and legs had been down more than usual.  She may have gotten extra salt in her diet while at the beach-sausage and eating out. More fatigue past 2 days and wondering if UTI back.Denies shortness of breath, dyspnea on exertion, dizziness or presyncope.    Past Medical History:  Diagnosis Date  . Fatigue   . Malaise   . Myalgia   . PAC (premature atrial contraction)    ISOLATED  . Palpitations    OCCASSIONAL  . Paroxysmal atrial flutter ([place])   . PVC's (premature ventricular contractions)    ISOLATED    Past Surgical History:  Procedure Laterality Date  . THORACIC LAMINECTOMY FOR EPIDURAL ABSCESS N/A 11/16/2014   Procedure: THORACIC LAMINECTOMY FOR EPIDURAL ABSCESS;  Surgeon: Floyce Stakes, MD;  Location: [facility];  Service: Neurosurgery;  Laterality: N/A;  . VULVAR LESION REMOVAL  01/01/2009   She had a 1-cm area of erythema to the right of the urethral meatus    Current Medications: Current Meds  Medication Sig  . aspirin 81 MG EC tablet Take 1 tablet (81 mg total) by mouth daily.  Marland Kitchen bismuth subsalicylate (PEPTO BISMOL) 262 MG/15ML suspension Take 30 mLs by mouth every 4 (four) hours as needed for indigestion or diarrhea or loose stools.  . Calcium Carb-Cholecalciferol  (CALCIUM 600 + D PO) Take 1 tablet by mouth daily.   . digoxin (LANOXIN) 0.125 MG tablet TAKE 1 TABLET BY MOUTH EVERY DAY  . docusate sodium (COLACE) 100 MG capsule Take 1 capsule (100 mg total) by mouth daily. (Patient taking differently: Take 100 mg by mouth every other day. )  . famotidine (PEPCID) 10 MG tablet Take 1 tablet (10 mg total) by mouth daily. (Patient taking differently: Take 10 mg by mouth daily as needed. )  . furosemide (LASIX) 20 MG tablet Take 1 tablet (20 mg total) by mouth once a week. For edema  . LUMIGAN 0.01 % SOLN Place 1 drop into both eyes at bedtime.  . polyethylene glycol (MIRALAX / GLYCOLAX) packet Take 17 g by mouth daily as needed for mild constipation.  . Probiotic Product (PROBIOTIC ACIDOPHILUS BEADS) CAPS Take 1 capsule by mouth daily.  . vitamin B-12 (CYANOCOBALAMIN) 500 MCG tablet Take 1 tablet (500 mcg total) by mouth daily.     Allergies:   Crestor [rosuvastatin calcium] and Rosuvastatin   Social History   Socioeconomic History  . Marital status: Divorced    Spouse name: Not on file  . Number of children: Not on file  . Years of education: Not on file  . Highest education level: Not on file  Occupational History  . Not on file  Social Needs  . Financial resource strain: Not on file  . Food insecurity  Worry: Not on file    Inability: Not on file  . Transportation needs    Medical: Not on file    Non-medical: Not on file  Tobacco Use  . Smoking status: Former Smoker    Quit date: 03/09/1991    Years since quitting: 28.2  . Smokeless tobacco: Former Engineer, waterUser  Substance and Sexual Activity  . Alcohol use: No  . Drug use: No  . Sexual activity: Not on file  Lifestyle  . Physical activity    Days per week: Not on file    Minutes per session: Not on file  . Stress: Not on file  Relationships  . Social Musicianconnections    Talks on phone: Not on file    Gets together: Not on file    Attends religious service: Not on file    Active member of  club or organization: Not on file    Attends meetings of clubs or organizations: Not on file    Relationship status: Not on file  Other Topics Concern  . Not on file  Social History Narrative  . Not on file     Family History:  The patient's family history includes Hyperlipidemia in her mother.   ROS:   Please see the history of present illness.    Review of Systems  Constitution: Positive for malaise/fatigue.  Cardiovascular: Positive for leg swelling.   All other systems reviewed and are negative.   PHYSICAL EXAM:   VS:  BP 110/72   Pulse 66   Ht 5\' 8"  (1.727 m)   Wt 109 lb 1.9 oz (49.5 kg)   SpO2 98%   BMI 16.59 kg/m   Physical Exam  GEN: Thin, elderly, in no acute distress  Neck: no JVD, carotid bruits, or masses Cardiac:irreg irreg no murmurs, rubs, or gallops  Respiratory:  clear to auscultation bilaterally, normal work of breathing GI: soft, nontender, nondistended, + BS Ext: without cyanosis, clubbing, or edema, Good distal pulses bilaterally Neuro:  Alert and Oriented x 3 Psych: euthymic mood, full affect  Wt Readings from Last 3 Encounters:  05/23/19 109 lb 1.9 oz (49.5 kg)  05/01/19 106 lb 3.2 oz (48.2 kg)  12/26/18 108 lb 6.4 oz (49.2 kg)      Studies/Labs Reviewed:   EKG:  EKG is not ordered today.    Recent Labs: 11/15/2018: B Natriuretic Peptide 332.7 11/18/2018: ALT 18 05/01/2019: BUN 18; Creatinine, Ser 0.78; Hemoglobin 15.6; NT-Pro BNP 1,407; Platelets 208; Potassium 4.5; Sodium 141; TSH 2.630   Lipid Panel    Component Value Date/Time   CHOL 211 (H) 05/01/2019 1435   TRIG 90 05/01/2019 1435   HDL 85 05/01/2019 1435   CHOLHDL 2.5 05/01/2019 1435   CHOLHDL 3 02/18/2012 0937   VLDL 10.0 02/18/2012 0937   LDLCALC 108 (H) 05/01/2019 1435   LDLDIRECT 126.0 02/18/2012 0937    Additional studies/ records that were reviewed today include:  Echo 10/2018 Study Conclusions   - Left ventricle: The cavity size was normal. Wall thickness was    normal. Systolic function was normal. The estimated ejection   fraction was in the range of 60% to 65%. - Mitral valve: There was mild regurgitation. - Right ventricle: The cavity size was severely dilated. - Right atrium: The atrium was severely dilated. - Atrial septum: No defect or patent foramen ovale was identified. - Pulmonary arteries: PA peak pressure: 42 mm Hg (S). - Pericardium, extracardiac: There was a left pleural effusion.   Left ventricle:  The cavity size was normal. Wall thickness was normal. Systolic function was normal. The estimated ejection fraction was in the range of 60% to 65%.   ------------------------------------------------------------------- Aortic valve:   Trileaflet; mildly thickened leaflets.  Doppler: There was no stenosis.   ------------------------------------------------------------------- Mitral valve:   Mildly thickened leaflets .  Doppler:  There was mild regurgitation.    Peak gradient (D): 5 mm Hg.   ------------------------------------------------------------------- Left atrium:  The atrium was normal in size.   ------------------------------------------------------------------- Atrial septum:  No defect or patent foramen ovale was identified.   ------------------------------------------------------------------- Right ventricle:  The cavity size was severely dilated.   ------------------------------------------------------------------- Pulmonic valve:    Doppler:  There was mild regurgitation.   ------------------------------------------------------------------- Right atrium:  The atrium was severely dilated.   ------------------------------------------------------------------- Pericardium:  The pericardium was normal in appearance.   ------------------------------------------------------------------- Systemic veins: Inferior vena cava: The vessel was dilated. The respirophasic diameter changes were blunted (< 50%), consistent with  elevated central venous pressure.   ------------------------------------------------------------------- Pleura:  There was a left pleural effusion.       ASSESSMENT:    1. Chronic diastolic heart failure (HCC)   2. Atrial fibrillation, unspecified type (HCC)   3. Benign essential HTN   4. Edema, unspecified type   5. History of recurrent UTIs      PLAN:  In order of problems listed above:  Chronic diastolic CHF-recent Lower extremity edema resolved. Suspected secondary to extra salt in her diet and dangling her legs at the beach. Will check bmet, bnp-take extra lasix prn edema, 2 gm sodium diet.  PAF not on anticoagulation-she's refused since bleed in past    HTN controlled  History of recurrent UTIs will recheck urine toda.    Medication Adjustments/Labs and Tests Ordered: Current medicines are reviewed at length with the patient today.  Concerns regarding medicines are outlined above.  Medication changes, Labs and Tests ordered today are listed in the Patient Instructions below. Patient Instructions  Medication Instructions:  Your physician has recommended you make the following change in your medication:   May take 1 extra furosemide (lasix) daily AS NEEDED for swelling  Lab work: TODAY: BMET, BNP, Urinalysis, Urine Culture  If you have labs (blood work) drawn today and your tests are completely normal, you will receive your results only by: Marland Kitchen. MyChart Message (if you have MyChart) OR . A paper copy in the mail If you have any lab test that is abnormal or we need to change your treatment, we will call you to review the results.  Testing/Procedures: None ordered  Follow-Up: . Follow up with Dr. Tenny Crawoss in February 2021  Any Other Special Instructions Will Be Listed Below (If Applicable).  Wear Compression stockings     Signed, Jacolyn ReedyMichele Lenze, PA-C  05/23/2019 3:35 PM    [facility] [address], [place], [place]  [address] Phone:  [phone]; Fax: [phone]

## 2019-05-23 NOTE — Patient Instructions (Signed)
Medication Instructions:  Your physician has recommended you make the following change in your medication:   May take 1 extra furosemide (lasix) daily AS NEEDED for swelling  Lab work: TODAY: BMET, BNP, Urinalysis, Urine Culture  If you have labs (blood work) drawn today and your tests are completely normal, you will receive your results only by: Marland Kitchen MyChart Message (if you have MyChart) OR . A paper copy in the mail If you have any lab test that is abnormal or we need to change your treatment, we will call you to review the results.  Testing/Procedures: None ordered  Follow-Up: . Follow up with Dr. Harrington Challenger in February 2021  Any Other Special Instructions Will Be Listed Below (If Applicable).  Wear Compression stockings

## 2019-05-24 ENCOUNTER — Telehealth: Payer: Self-pay | Admitting: Internal Medicine

## 2019-05-24 DIAGNOSIS — E875 Hyperkalemia: Secondary | ICD-10-CM

## 2019-05-24 LAB — BASIC METABOLIC PANEL
BUN/Creatinine Ratio: 29 — ABNORMAL HIGH (ref 12–28)
BUN: 20 mg/dL (ref 8–27)
CO2: 25 mmol/L (ref 20–29)
Calcium: 9.4 mg/dL (ref 8.7–10.3)
Chloride: 100 mmol/L (ref 96–106)
Creatinine, Ser: 0.7 mg/dL (ref 0.57–1.00)
GFR calc Af Amer: 91 mL/min/{1.73_m2} (ref >59)
GFR calc non Af Amer: 79 mL/min/{1.73_m2} (ref >59)
Glucose: 89 mg/dL (ref 65–99)
Potassium: 5.5 mmol/L — ABNORMAL HIGH (ref 3.5–5.2)
Sodium: 140 mmol/L (ref 134–144)

## 2019-05-24 LAB — PRO B NATRIURETIC PEPTIDE: NT-Pro BNP: 1960 pg/mL — ABNORMAL HIGH (ref 0–738)

## 2019-05-24 NOTE — Telephone Encounter (Signed)
Patient returning call for results 

## 2019-05-24 NOTE — Telephone Encounter (Addendum)
Pt was seen by Gerrianne Scale.  Labs and urine sent.  Spoke with Vi Appleton (DPR). She will give extra dose of lasix now. Would prefer for home health to come and redraw BMET next week.  Adv this would be arranged. Awaiting UA results at this time.

## 2019-05-25 ENCOUNTER — Telehealth: Payer: Self-pay

## 2019-05-25 DIAGNOSIS — E875 Hyperkalemia: Secondary | ICD-10-CM

## 2019-05-25 DIAGNOSIS — I4891 Unspecified atrial fibrillation: Secondary | ICD-10-CM

## 2019-05-25 LAB — URINE CULTURE

## 2019-05-25 LAB — URINALYSIS
Bilirubin, UA: NEGATIVE
Glucose, UA: NEGATIVE
Ketones, UA: NEGATIVE
Nitrite, UA: NEGATIVE
Protein,UA: NEGATIVE
RBC, UA: NEGATIVE
Specific Gravity, UA: 1.013 (ref 1.005–1.030)
Urobilinogen, Ur: 0.2 mg/dL (ref 0.2–1.0)
pH, UA: 7 (ref 5.0–7.5)

## 2019-05-25 NOTE — Telephone Encounter (Signed)
Called and spoke to [name] Weber (DPR on file). Made her aware of lab and urine results. She spoke to Dr. Harrington Challenger RN yesterday and had the patient take an extra lasix (see below). Made patient aware that I will place order for [facility] to come out to repeat BMET and that they will contact her to arrange a time. Made her aware that [place] recommended bartrim ds bid for 3 days last time she will need to follow up with pcp for this. Made her aware that she may need to be on chronic low dose antibiotic for recurrent UTIs. Made her aware that I will forward the results to Dr. Inda Merlin and she should reach out to him for management of her UTI.  She verbalized understanding and thanked me for the call. Orders sent for [facility] Results forwarded to Dr. Inda Merlin.

## 2019-05-25 NOTE — Telephone Encounter (Signed)
[facility] Home Visit Initial Request  Date of Request (DOR):  May 25, 2019  Requesting Provider:  Jacolyn ReedyMichele Lenze, PA    Agency Requested:    [facility] Contact:  Alcide CleverMichelle Schmerge, NP [address] [place], [place] [address] Phone #:  [phone] Fax #:  [phone]  Patient Demographic Information: Name:  [name] Weber Age:  85 y.o.   DOB:  09/08/1931  MRN:  [id number]   Address:   [address] [place] [address]   Phone Numbers:   Home Phone [phone]  Mobile [phone]     Emergency Contact Information on File:   Contact Information    Name Relation Home Work [address]   Lupita Raiderppleton, Viola Mae Friend   [phone]   Angelica RanMohorn,Faye Friend   [phone]      The above family members may be contacted for information on this patient (review DPR on file):  Yes    Patient Clinical Information:  Primary Care Provider:  Marden NobleGates, Robert, MD  Primary Cardiologist:  Dietrich PatesPaula Ross, MD  Primary Electrophysiologist:  None   Past Medical Hx: Ms. [name]  has a past medical history of Fatigue, Malaise, Myalgia, PAC (premature atrial contraction), Palpitations, Paroxysmal atrial flutter (HCC), and PVC's (premature ventricular contractions).   Allergies: She is allergic to crestor [rosuvastatin calcium] and rosuvastatin.   Medications: Current Outpatient Medications on File Prior to Visit  Medication Sig  . aspirin 81 MG EC tablet Take 1 tablet (81 mg total) by mouth daily.  Marland Kitchen. bismuth subsalicylate (PEPTO BISMOL) 262 MG/15ML suspension Take 30 mLs by mouth every 4 (four) hours as needed for indigestion or diarrhea or loose stools.  . Calcium Carb-Cholecalciferol (CALCIUM 600 + D PO) Take 1 tablet by mouth daily.   . digoxin (LANOXIN) 0.125 MG tablet TAKE 1 TABLET BY MOUTH EVERY DAY  . docusate sodium (COLACE) 100 MG capsule Take 1 capsule (100 mg total) by mouth daily. (Patient taking differently: Take 100 mg by mouth every other day. )   . famotidine (PEPCID) 10 MG tablet Take 1 tablet (10 mg total) by mouth daily. (Patient taking differently: Take 10 mg by mouth daily as needed. )  . furosemide (LASIX) 20 MG tablet Take 1 tablet (20 mg total) by mouth once a week. For edema  . LUMIGAN 0.01 % SOLN Place 1 drop into both eyes at bedtime.  . polyethylene glycol (MIRALAX / GLYCOLAX) packet Take 17 g by mouth daily as needed for mild constipation.  . Probiotic Product (PROBIOTIC ACIDOPHILUS BEADS) CAPS Take 1 capsule by mouth daily.  . vitamin B-12 (CYANOCOBALAMIN) 500 MCG tablet Take 1 tablet (500 mcg total) by mouth daily.   No current facility-administered medications on file prior to visit.      Social Hx: She  reports that she quit smoking about 28 years ago. She has quit using smokeless tobacco. She reports that she does not drink alcohol or use drugs.    Diagnosis/Reason for Visit:   CHF, Recurrent UTIs, Afib, HTN, Edema, Hyperkalemia  Comments from results:  Notes recorded by Lattie Hawurrie, Stephanie Mcglone I, RN on 05/25/2019 at 9:38 AM EDT  Called and spoke to Vi (DPR on file). Made her aware of lab and urine results. She spoke to Dr. Tenny Crawoss RN yesterday and had the patient take an extra lasix (see phone note). Made patient aware that I [facility] place [facility] and that they will contact her to arrange a time.  Made her aware that [place] recommended bartrim ds bid for 3 days last time she will need to follow up with pcp for this. Made her aware that she may need to be on chronic low dose antibiotic for recurrent UTIs. Made her aware that I will forward the results to Dr. Inda Merlin and she should reach out to him for management of her UTI. She verbalized understanding and thanked me for the call. Orders sent for [facility]. Results forwarded to Dr. Inda Merlin.  ------   Notes recorded by Imogene Burn, PA-C on 05/25/2019 at 8:28 AM EDT  Looks like she has a UTI. [place] recommend bartrim ds bid for 3 days last time. Please have  her follow up with pcp for this. She may need to be on chronic low dose antibiotic for recurrent utis  ------   Notes recorded by Cleon Gustin, RN on 05/24/2019 at 3:50 PM EDT  Left message for patient to call back.  ------   Notes recorded by Imogene Burn, PA-C on 05/24/2019 at 2:22 PM EDT  Heart failure marker elevated so take an extra lasix today. Potassium also elevated. Could be lab error but lasix should take care of it. Need repeat bmet this week. Can send [place] to draw if needed. UA pending.  Services Requested:  Labs:  BMET  # of Visits Needed/Frequency per Week: Once

## 2019-05-25 NOTE — Telephone Encounter (Signed)
Per Ermalinda Barrios, PA patient will need a Dig level drawn as well. Order placed and released.

## 2019-05-25 NOTE — Telephone Encounter (Signed)
Notes recorded by Imogene Burn, PA-C on 05/25/2019 at 8:28 AM EDT  Looks like she has a UTI. [place] recommend bartrim ds bid for 3 days last time. Please have her follow up with pcp for this. She may need to be on chronic low dose antibiotic for recurrent utis  ------   Notes recorded by Cleon Gustin, RN on 05/24/2019 at 3:50 PM EDT  Left message for patient to call back.  ------   Notes recorded by Imogene Burn, PA-C on 05/24/2019 at 2:22 PM EDT  Heart failure marker elevated so take an extra lasix today. Potassium also elevated. Could be lab error but lasix should take care of it. Need repeat bmet this week. Can send [place] to draw if needed. UA pending.

## 2019-05-25 NOTE — Addendum Note (Signed)
Addended by: Drue Novel I on: 05/25/2019 04:42 PM   Modules accepted: Orders

## 2019-05-26 ENCOUNTER — Telehealth: Payer: Self-pay | Admitting: Internal Medicine

## 2019-05-26 ENCOUNTER — Encounter: Payer: Self-pay | Admitting: Physician Assistant

## 2019-05-26 NOTE — Telephone Encounter (Signed)
New message:     Patient care giver calling concering the patient. Please call.

## 2019-05-26 NOTE — Progress Notes (Signed)
[name]      DOB: March 08, 1931  Purpose of Visit: BMET, Dig Level Cardiac provider: Reece Leader, PA  Medications: Is the patient taking all medications listed on MAR from Epic? Yes  List any medications that are not being taken correctly: no   List any medication refills needed: n/a  Is the patient able to pick up medications? Yes, her neighbor (POA), Vi as well as other neighbors assist  Vitals: BP:  130/84   HR: 69   Oxygen:  97%RA Weight:  109.4lbs        Physical Exam:  Lung sounds: clear  Heart sounds: irregular  Peripheral edema: No, POA (Vi) stated that Ms. Lacewell took an additional Lasix 20mg  on Tues (normally takes 20mg  Q Sunday) and Vi states her ankles/feel look WNL now.  Wounds: no    Any patient concerns? POA, Vi, is concerned about Ms. Kisner UTI's stating she has had several over the last few months.  Vi states Ms. Dioguardi doesn't drink sufficient fluids through the day, but tries to get her to drink water, cranberry juice (to help w/UTIs every few days) as well as Gatorade.  Vi has called Ms. Cumming's PCP's office a few times to try and get her on a longer regime of abx for the UTIs.   BMET and Digoxin Level (placed on ice) were drawn and brought to the [facility] on [address] within 15 minutes.   ReDS Vest/Clip Reading: n/a  Rhythm Strip: n/a  Is Home Health recommended? No If yes, state reason:   Vanita Ingles, RN 05/26/19

## 2019-05-26 NOTE — Telephone Encounter (Signed)
I spoke with pt's caregiver. She was told to contact primary care regarding urinalysis results. She contacted primary care but they have not received results.  I told caregiver I would follow up. I placed call to Dr. Inda Merlin' office and left message on voicemail.

## 2019-05-26 NOTE — Telephone Encounter (Signed)
I spoke with [place] and they have heard from Dr. Inda Merlin office and antibiotic has been started.

## 2019-05-27 LAB — SPECIMEN STATUS REPORT

## 2019-05-27 LAB — BASIC METABOLIC PANEL
BUN/Creatinine Ratio: 25 (ref 12–28)
BUN: 17 mg/dL (ref 8–27)
CO2: 25 mmol/L (ref 20–29)
Calcium: 8.6 mg/dL — ABNORMAL LOW (ref 8.7–10.3)
Chloride: 101 mmol/L (ref 96–106)
Creatinine, Ser: 0.69 mg/dL (ref 0.57–1.00)
GFR calc Af Amer: 92 mL/min/{1.73_m2} (ref >59)
GFR calc non Af Amer: 80 mL/min/{1.73_m2} (ref >59)
Glucose: 104 mg/dL — ABNORMAL HIGH (ref 65–99)
Potassium: 4.6 mmol/L (ref 3.5–5.2)
Sodium: 139 mmol/L (ref 134–144)

## 2019-05-27 LAB — DIGOXIN LEVEL: Digoxin, Serum: 1.7 ng/mL — ABNORMAL HIGH (ref 0.5–0.9)

## 2019-05-29 ENCOUNTER — Telehealth: Payer: Self-pay | Admitting: *Deleted

## 2019-05-29 NOTE — Telephone Encounter (Signed)
Spoke with Vi Appleton (DPR) and adv to stop digoxin.  She will check BP, HR about 3 times per week and keep a list.  Pt seeing Dr. Inda Merlin this week, requests lab results forwarded to him.  In office appointment scheduled for 07/31/19 w Dr. Harrington Challenger.  Will call if feels pt needs to be seen sooner for earlier appointment.

## 2019-05-29 NOTE — Telephone Encounter (Signed)
-----   Message from Fay Records, MD sent at 05/29/2019  8:54 AM EDT ----- I would recomm stopping digoxin  Risk outway benefits Set for f/u televisit later this summer  Patient should keep track of vital signs

## 2019-06-29 ENCOUNTER — Telehealth: Payer: Self-pay

## 2019-06-29 NOTE — Telephone Encounter (Signed)
Request notes from Dr. Inda Merlin office

## 2019-07-28 ENCOUNTER — Telehealth: Payer: Self-pay | Admitting: *Deleted

## 2019-07-28 NOTE — Telephone Encounter (Signed)
Called and spoke with patient's daughter [facility]) Changed appointment to virtual with Dr. Harrington Challenger for 07/31/19.  Has smartphone.  Will do video.    Virtual Visit Pre-Appointment Phone Call  "(Name), I am calling you today to discuss your upcoming appointment. We are currently trying to limit exposure to the virus that causes COVID-19 by seeing patients at home rather than in the office."  1. "What is the BEST phone number to call the day of the visit?" - include this in appointment notes  2. "Do you have or have access to (through a family member/friend) a smartphone with video capability that we can use for your visit?" a. If yes - list this number in appt notes as "cell" (if different from BEST phone #) and list the appointment type as a VIDEO visit in appointment notes b. If no - list the appointment type as a PHONE visit in appointment notes  3. Confirm consent - "In the setting of the current Covid19 crisis, you are scheduled for a (phone or video) visit with your provider on (date) at (time).  Just as we do with many in-office visits, in order for you to participate in this visit, we must obtain consent.  If you'd like, I can send this to your mychart (if signed up) or email for you to review.  Otherwise, I can obtain your verbal consent now.  All virtual visits are billed to your insurance company just like a normal visit would be.  By agreeing to a virtual visit, we'd like you to understand that the technology does not allow for your provider to perform an examination, and thus may limit your provider's ability to fully assess your condition. If your provider identifies any concerns that need to be evaluated in person, we will make arrangements to do so.  Finally, though the technology is pretty good, we cannot assure that it will always work on either your or our end, and in the setting of a video visit, we may have to convert it to a phone-only visit.  In either situation, we cannot ensure that we  have a secure connection.  Are you willing to proceed?" STAFF: Did the patient verbally acknowledge consent to telehealth visit? Document YES/NO here: YET  4. Advise patient to be prepared - "Two hours prior to your appointment, go ahead and check your blood pressure, pulse, oxygen saturation, and your weight (if you have the equipment to check those) and write them all down. When your visit starts, your provider will ask you for this information. If you have an Apple Watch or Kardia device, please plan to have heart rate information ready on the day of your appointment. Please have a pen and paper handy nearby the day of the visit as well."  5. Give patient instructions for MyChart download to smartphone OR Doximity/Doxy.me as below if video visit (depending on what platform provider is using)  6. Inform patient they will receive a phone call 15 minutes prior to their appointment time (may be from unknown caller ID) so they should be prepared to answer    TELEPHONE CALL NOTE  FARIN BUHMAN has been deemed a candidate for a follow-up tele-health visit to limit community exposure during the Covid-19 pandemic. I spoke with the patient via phone to ensure availability of phone/video source, confirm preferred email & phone number, and discuss instructions and expectations.  I reminded MAKARIA POARCH to be prepared with any vital sign and/or heart rhythm information that could  potentially be obtained via home monitoring, at the time of her visit. I reminded Stephani Policevelyn C [place] to expect a phone call prior to her visit.  Lendon KaWilson, Denman Pichardo, RN 07/28/2019 5:14 PM   INSTRUCTIONS FOR DOWNLOADING THE MYCHART APP TO SMARTPHONE  - The patient must first make sure to have activated MyChart and know their login information - If Apple, go to [employer] Store and type in MyChart in the search bar and download the app. If Android, ask patient to go to [employer] Play Store and type in [place] in the search bar and  download the app. The app is free but as with any other app downloads, their phone may require them to verify saved payment information or Apple/Android password.  - The patient will need to then log into the app with their MyChart username and password, and select [facility] as their healthcare provider to link the account. When it is time for your visit, go to the MyChart app, find appointments, and click Begin Video Visit. Be sure to Select Allow for your device to access the Microphone and Camera for your visit. You will then be connected, and your provider will be with you shortly.  **If they have any issues connecting, or need assistance please contact MyChart service desk [phone])**  **If using a computer, in order to ensure the best quality for their visit they will need to use either of the following Internet Browsers: [employer] Edge, or Google Chrome**  IF USING DOXIMITY or DOXY.ME - The patient will receive a link just prior to their visit by text.     FULL LENGTH CONSENT FOR TELE-HEALTH VISIT   I hereby voluntarily request, consent and authorize [facility] and its employed or contracted physicians, physician assistants, nurse practitioners or other licensed health care professionals (the Practitioner), to provide me with telemedicine health care services (the "Services") as deemed necessary by the treating Practitioner. I acknowledge and consent to receive the Services by the Practitioner via telemedicine. I understand that the telemedicine visit will involve communicating with the Practitioner through live audiovisual communication technology and the disclosure of certain medical information by electronic transmission. I acknowledge that I have been given the opportunity to request an in-person assessment or other available alternative prior to the telemedicine visit and am voluntarily participating in the telemedicine visit.  I understand that I have the right to  withhold or withdraw my consent to the use of telemedicine in the course of my care at any time, without affecting my right to future care or treatment, and that the Practitioner or I may terminate the telemedicine visit at any time. I understand that I have the right to inspect all information obtained and/or recorded in the course of the telemedicine visit and may receive copies of available information for a reasonable fee.  I understand that some of the potential risks of receiving the Services via telemedicine include:  Marland Kitchen. Delay or interruption in medical evaluation due to technological equipment failure or disruption; . Information transmitted may not be sufficient (e.g. poor resolution of images) to allow for appropriate medical decision making by the Practitioner; and/or  . In rare instances, security protocols could fail, causing a breach of personal health information.  Furthermore, I acknowledge that it is my responsibility to provide information about my medical history, conditions and care that is complete and accurate to the best of my ability. I acknowledge that Practitioner's advice, recommendations, and/or decision may be based on factors not within their  control, such as incomplete or inaccurate data provided by me or distortions of diagnostic images or specimens that may result from electronic transmissions. I understand that the practice of medicine is not an exact science and that Practitioner makes no warranties or guarantees regarding treatment outcomes. I acknowledge that I will receive a copy of this consent concurrently upon execution via email to the email address I last provided but may also request a printed copy by calling the office of [place].    I understand that my insurance will be billed for this visit.   I have read or had this consent read to me. . I understand the contents of this consent, which adequately explains the benefits and risks of the Services being  provided via telemedicine.  . I have been provided ample opportunity to ask questions regarding this consent and the Services and have had my questions answered to my satisfaction. . I give my informed consent for the services to be provided through the use of telemedicine in my medical care  By participating in this telemedicine visit I agree to the above.

## 2019-07-31 ENCOUNTER — Telehealth (INDEPENDENT_AMBULATORY_CARE_PROVIDER_SITE_OTHER): Payer: Medicare Other | Admitting: Internal Medicine

## 2019-07-31 ENCOUNTER — Encounter: Payer: Self-pay | Admitting: Internal Medicine

## 2019-07-31 ENCOUNTER — Other Ambulatory Visit: Payer: Self-pay

## 2019-07-31 VITALS — BP 129/91 | HR 94 | Temp 97.4°F | Ht 68.0 in

## 2019-07-31 DIAGNOSIS — I480 Paroxysmal atrial fibrillation: Secondary | ICD-10-CM

## 2019-07-31 NOTE — Patient Instructions (Signed)
Medication Instructions:  No changes If you need a refill on your cardiac medications before your next appointment, please call your pharmacy.   Lab work: none If you have labs (blood work) drawn today and your tests are completely normal, you will receive your results only by: Marland Kitchen MyChart Message (if you have MyChart) OR . A paper copy in the mail If you have any lab test that is abnormal or we need to change your treatment, we will call you to review the results.  Testing/Procedures: Your physician has recommended that you wear a heart monitor. Heart monitors are medical devices that record the heart's electrical activity. Doctors most often use these monitors to diagnose arrhythmias. Arrhythmias are problems with the speed or rhythm of the heartbeat. The monitor is a small, portable device. You can wear one while you do your normal daily activities. This is usually used to diagnose what is causing palpitations/syncope (passing out).   Follow-Up: At [facility], you and your health needs are our priority.  As part of our continuing mission to provide you with exceptional heart care, we have created designated Provider Care Teams.  These Care Teams include your primary Cardiologist (physician) and Advanced Practice Providers (APPs -  Physician Assistants and Nurse Practitioners) who all work together to provide you with the care you need, when you need it. You will need a follow up appointment in:  6 months.  Please call our office 2 months in advance to schedule this appointment.  You may see Dorris Carnes, MD or one of the following Advanced Practice Providers on your designated Care Team: Richardson Dopp, PA-C [place], [place] . Daune Perch, NP  Any Other Special Instructions Will Be Listed Below (If Applicable).

## 2019-07-31 NOTE — Progress Notes (Signed)
Televisist due to COVID 19    Date:  07/31/2019   ID:  [name], DOB [birth year], MRN [id number]  Patient Location: Home Provider Location: Home  PCP:  Josetta Huddle, MD  Cardiologist:  Dorris Carnes, MD  Electrophysiologist:  None   Evaluation Performed:  Follow-Up Visit  Chief Complaint:  F/U of atrial fibrillation    History of Present Illness:    [name] Weber is a 85 y.o. female with hx of PAF, HTN, HL, spinal bleed.  Last seen in clinic in June by Gerrianne Scale   Had LE edema at that time   Felt due to salt indiscretion    The pt says  her breathing is OK  No CP  No dizziness  No swelling  The patient does not have symptoms concerning for COVID-19 infection (fever, chills, cough, or new shortness of breath).    Past Medical History:  Diagnosis Date  . A-fib ([place])   . Abdominal pain   . Acute pulmonary embolism ([place])   . Age-related osteoporosis with current pathological fracture   . Anxiety state 12/10/2014  . Atrial flutter ([place]) 03/10/2011  . B12 deficiency 11/08/2018  . Bacterial UTI 11/30/2014  . Benign essential HTN   . Benign hypertensive heart disease without heart failure 03/10/2011  . Bilateral foot-drop   . Bilateral pleural effusion 11/15/2018  . Chronic constipation   . Chronic diastolic heart failure ([place]) 05/23/2019  . Chronic midline low back pain without sciatica   . Closed nondisplaced fracture of pelvis with routine healing   . Compression fracture of L2 ([place]) 05/30/2016  . Constipation due to pain medication   . Cystitis 11/05/2018  . Depression   . Elevated blood pressure 05/30/2016  . Elevated brain natriuretic peptide (BNP) level 11/15/2018  . Encounter for therapeutic drug monitoring 12/25/2013  . Fatigue   . Generalized anxiety disorder   . HLD (hyperlipidemia) 03/10/2011  . Hypoalbuminemia due to protein-calorie malnutrition ([place])   . L2 vertebral fracture ([place]) 05/30/2016  . Leukocytosis 11/12/2018  . Lumbar burst fracture ([place]) 06/08/2016  .  Lumbar compression fracture ([place]) 06/08/2016  . Malaise   . Malaise and fatigue 06/01/2011  . Malnutrition of moderate degree 11/15/2018  . Memory loss   . Midline thoracic back pain   . Multiple closed fractures of pelvis with stable disruption of pelvic circle (HCC)   . Multiple rib fractures 11/05/2018  . Myalgia   . Myelopathy ([place]) 01/06/2017  . Near syncope 11/21/2014  . Neurogenic bladder 11/30/2014  . Neurogenic bowel 11/30/2014  . Nocturnal leg cramps 07/16/2014  . Orthostatic hypotension 11/11/2018  . Osteoporosis with current pathological fracture   . Other osteoporosis with current pathological fracture   . PAC (premature atrial contraction)    ISOLATED  . Pain   . Palpitations    OCCASSIONAL  . Paroxysmal atrial flutter ([place])   . Pelvic fracture ([place]) 12/26/2016  . Pelvic ring fracture ([place])   . Persistent atrial fibrillation ([place]) 11/27/2014  . Pulmonary hypertension ([place])   . PVC's (premature ventricular contractions)    ISOLATED  . Rectal abnormality 11/11/2018  . Rib fracture 11/10/2018  . Skin tear of lower leg without complication, left, initial encounter   . Skin tear of lower leg without complication, right, initial encounter   . Slow transit constipation   . Thoracic myelopathy 11/29/2014  . Thrombocytopenia ([place])   . Thrombus   . Traumatic spinal subdural hematoma   . Weakness of both legs  Past Surgical History:  Procedure Laterality Date  . THORACIC LAMINECTOMY FOR EPIDURAL ABSCESS N/A 11/16/2014   Procedure: THORACIC LAMINECTOMY FOR EPIDURAL ABSCESS;  Surgeon: Karn Cassis, MD;  Location: [facility];  Service: Neurosurgery;  Laterality: N/A;  . VULVAR LESION REMOVAL  01/01/2009   She had a 1-cm area of erythema to the right of the urethral meatus     Current Meds  Medication Sig  . aspirin 81 MG EC tablet Take 1 tablet (81 mg total) by mouth daily.  Marland Kitchen bismuth subsalicylate (PEPTO BISMOL) 262 MG/15ML suspension Take 30 mLs by mouth every 4  (four) hours as needed for indigestion or diarrhea or loose stools.  . Calcium Carb-Cholecalciferol (CALCIUM 600 + D PO) Take 1 tablet by mouth daily.   Marland Kitchen docusate sodium (COLACE) 100 MG capsule Take 1 capsule (100 mg total) by mouth daily. (Patient taking differently: Take 100 mg by mouth every other day. )  . furosemide (LASIX) 20 MG tablet Take 1 tablet (20 mg total) by mouth once a week. For edema  . LUMIGAN 0.01 % SOLN Place 1 drop into both eyes at bedtime.  . polyethylene glycol (MIRALAX / GLYCOLAX) packet Take 17 g by mouth daily as needed for mild constipation.  . Probiotic Product (PROBIOTIC ACIDOPHILUS BEADS) CAPS Take 1 capsule by mouth daily.  Marland Kitchen trimethoprim (TRIMPEX) 100 MG tablet Take 100 mg by mouth 3 (three) times a week.  . vitamin B-12 (CYANOCOBALAMIN) 500 MCG tablet Take 1 tablet (500 mcg total) by mouth daily.     Allergies:   Crestor [rosuvastatin calcium] and Rosuvastatin   Social History   Tobacco Use  . Smoking status: Former Smoker    Quit date: 03/09/1991    Years since quitting: 28.4  . Smokeless tobacco: Former Engineer, water Use Topics  . Alcohol use: No  . Drug use: No     Family Hx: The patient's family history includes Hyperlipidemia in her mother. There is no history of Breast cancer.  ROS:   Please see the history of present illness.     All other systems reviewed and are negative.   Prior CV studies:   The following studies were reviewed today:  Echo 10/2018 Study Conclusions  - Left ventricle: The cavity size was normal. Wall thickness was normal. Systolic function was normal. The estimated ejection fraction was in the range of 60% to 65%. - Mitral valve: There was mild regurgitation. - Right ventricle: The cavity size was severely dilated. - Right atrium: The atrium was severely dilated. - Atrial septum: No defect or patent foramen ovale was identified. - Pulmonary arteries: PA peak pressure: 42 mm Hg (S). - Pericardium,  extracardiac: There was a left pleural effusion.  Left ventricle: The cavity size was normal. Wall thickness was normal. Systolic function was normal. The estimated ejection fraction was in the range of 60% to 65%.  ------------------------------------------------------------------- Aortic valve: Trileaflet; mildly thickened leaflets. Doppler: There was no stenosis.  ------------------------------------------------------------------- Mitral valve: Mildly thickened leaflets . Doppler: There was mild regurgitation. Peak gradient (D): 5 mm Hg.  ------------------------------------------------------------------- Left atrium: The atrium was normal in size.  ------------------------------------------------------------------- Atrial septum: No defect or patent foramen ovale was identified.  ------------------------------------------------------------------- Right ventricle: The cavity size was severely dilated.  ------------------------------------------------------------------- Pulmonic valve: Doppler: There was mild regurgitation.  ------------------------------------------------------------------- Right atrium: The atrium was severely dilated.  ------------------------------------------------------------------- Pericardium: The pericardium was normal in appearance.  ------------------------------------------------------------------- Systemic veins: Inferior vena cava: The vessel was dilated. The respirophasic  diameter changes were blunted (< 50%), consistent with elevated central venous pressure.  -------------------------------------------------------------------  Labs/Other Tests and Data Reviewed:    EKG:  No ECG reviewed.  Recent Labs: 11/15/2018: B Natriuretic Peptide 332.7 11/18/2018: ALT 18 05/01/2019: Hemoglobin 15.6; Platelets 208; TSH 2.630 05/23/2019: NT-Pro BNP 1,960 05/26/2019: BUN 17; Creatinine, Ser 0.69; Potassium 4.6; Sodium 139    Recent Lipid Panel Lab Results  Component Value Date/Time   CHOL 211 (H) 05/01/2019 02:35 PM   TRIG 90 05/01/2019 02:35 PM   HDL 85 05/01/2019 02:35 PM   CHOLHDL 2.5 05/01/2019 02:35 PM   CHOLHDL 3 02/18/2012 09:37 AM   LDLCALC 108 (H) 05/01/2019 02:35 PM   LDLDIRECT 126.0 02/18/2012 09:37 AM    Wt Readings from Last 3 Encounters:  05/26/19 109 lb 6.4 oz (49.6 kg)  05/23/19 109 lb 1.9 oz (49.5 kg)  05/01/19 106 lb 3.2 oz (48.2 kg)     Objective:    Vital Signs:  BP (!) 129/91   Pulse 94   Temp (!) 97.4 F (36.3 C)   Ht 5\' 8"  (1.727 m)   SpO2 97%   BMI 16.63 kg/m    VITAL SIGNS:  reviewed  ASSESSMENT & PLAN:    1. Persistent atrial fib   Plan for rate control  Off of digoxin   May not need anything  Will set up for 24 hour holter      Pt not on anticoagulation, has refused due to bleed 2  HTN  BP is fair  ON lasix   Follow      COVID-19 Education: The signs and symptoms of COVID-19 were discussed with the patient and how to seek care for testing (follow up with PCP or arrange E-visit).  The importance of social distancing was discussed today.  Time:   Today, I have spent 15 minutes with the patient with telehealth technology discussing the above problems.     Medication Adjustments/Labs and Tests Ordered: Current medicines are reviewed at length with the patient today.  Concerns regarding medicines are outlined above.   Tests Ordered: No orders of the defined types were placed in this encounter.   Medication Changes: No orders of the defined types were placed in this encounter.   Follow Up: F/U in   Signed, Dietrich PatesPaula Aydian Dimmick, MD  07/31/2019 2:49 PM    [facility]

## 2019-08-01 ENCOUNTER — Telehealth: Payer: Self-pay | Admitting: Radiology

## 2019-08-01 NOTE — Telephone Encounter (Signed)
Enrolled patient for a 3 day Zio monitor to be mailed. Brief instructions were gone over with Vi (POA) and she knows to expect the monitor to arrive in 3-4 days.

## 2019-08-10 ENCOUNTER — Telehealth: Payer: Self-pay | Admitting: *Deleted

## 2019-08-10 NOTE — Telephone Encounter (Signed)
ZIO patch monitor to be cancelled.  Patient coming in Wednesday, 08/23/2019, 1:00 PM, with [name] Weber. 3 day [employer] long term monitor will be applied to patient using sensitive skin set up.

## 2019-08-23 ENCOUNTER — Other Ambulatory Visit: Payer: Medicare Other

## 2019-08-23 ENCOUNTER — Other Ambulatory Visit: Payer: Self-pay

## 2019-08-23 ENCOUNTER — Ambulatory Visit (INDEPENDENT_AMBULATORY_CARE_PROVIDER_SITE_OTHER): Payer: Medicare Other

## 2019-08-23 DIAGNOSIS — I480 Paroxysmal atrial fibrillation: Secondary | ICD-10-CM | POA: Diagnosis not present

## 2019-09-12 ENCOUNTER — Telehealth: Payer: Self-pay | Admitting: *Deleted

## 2019-09-12 NOTE — Telephone Encounter (Signed)
We had received a call from [employer].  There was no data on the monitor applied 08/23/19. Spoke with POA, Vi Appleton.  She said she was not surprised .She said she does not live with the patient.  When she went to patients home to remove monitor, she found the patient had removed the monitor even though she left a large note not to.  She thought repeating the monitor might be a waste of time because Ms. Engh would probably remove the monitor again.

## 2019-09-13 NOTE — Telephone Encounter (Signed)
Do not order again

## 2019-09-20 ENCOUNTER — Telehealth: Payer: Self-pay | Admitting: *Deleted

## 2019-09-20 NOTE — Telephone Encounter (Signed)
[employer] received 2nd monitor from this patient which did have 5 days of data on it, ( one monitor was applied in office, the monitor with no data was a monitor enrolled to be shipped to patients home).  Monitor results to be imported.

## 2020-02-13 IMAGING — DX DG RIBS W/ CHEST 3+V*L*
5 series · 5 of 5 positions shown · non-contrast
Comparison: Chest radiograph 12/26/2016

CLINICAL DATA: Pain after fall.

EXAM:
LEFT RIBS AND CHEST - 3+ VIEW

[chest ap]
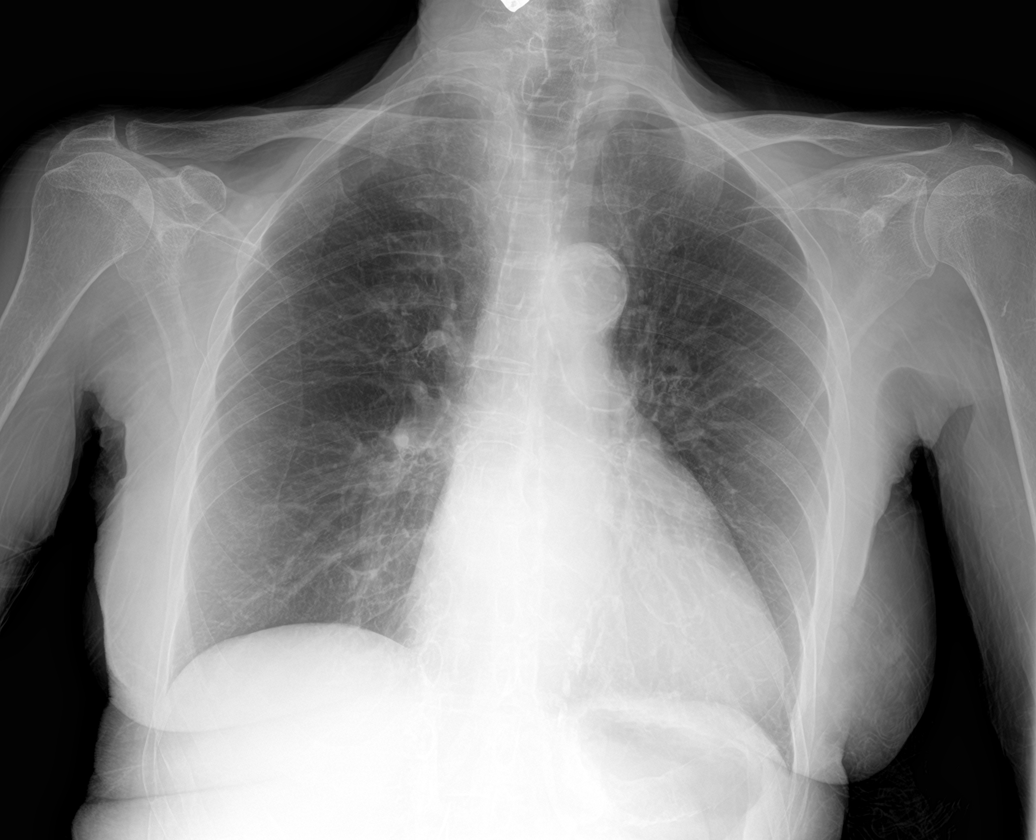

[rib ap obl (1 of 2)]
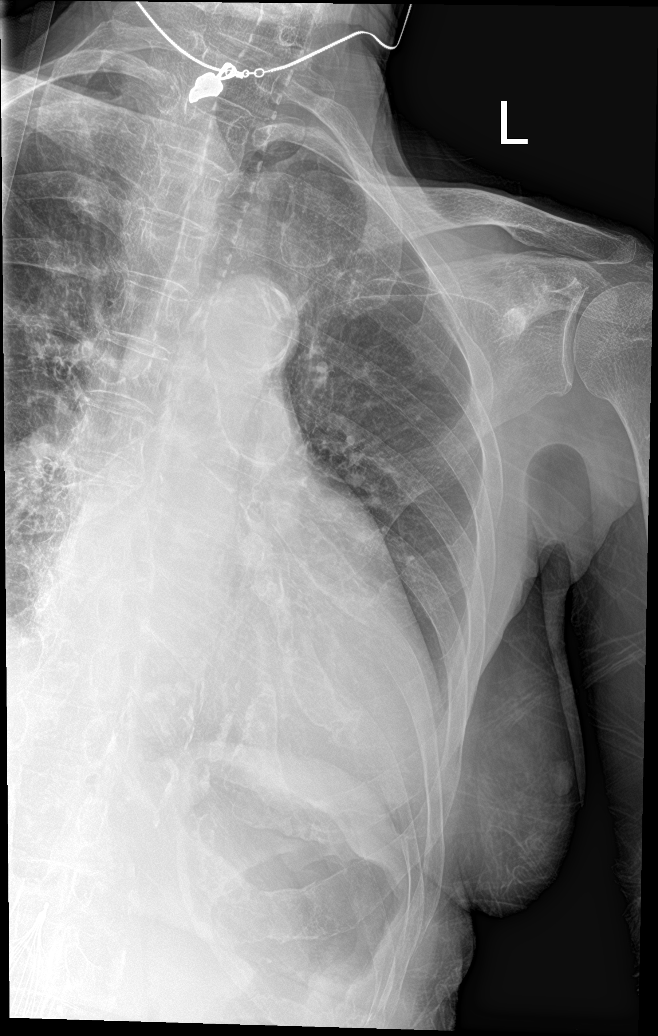

[rib ap (1 of 2)]
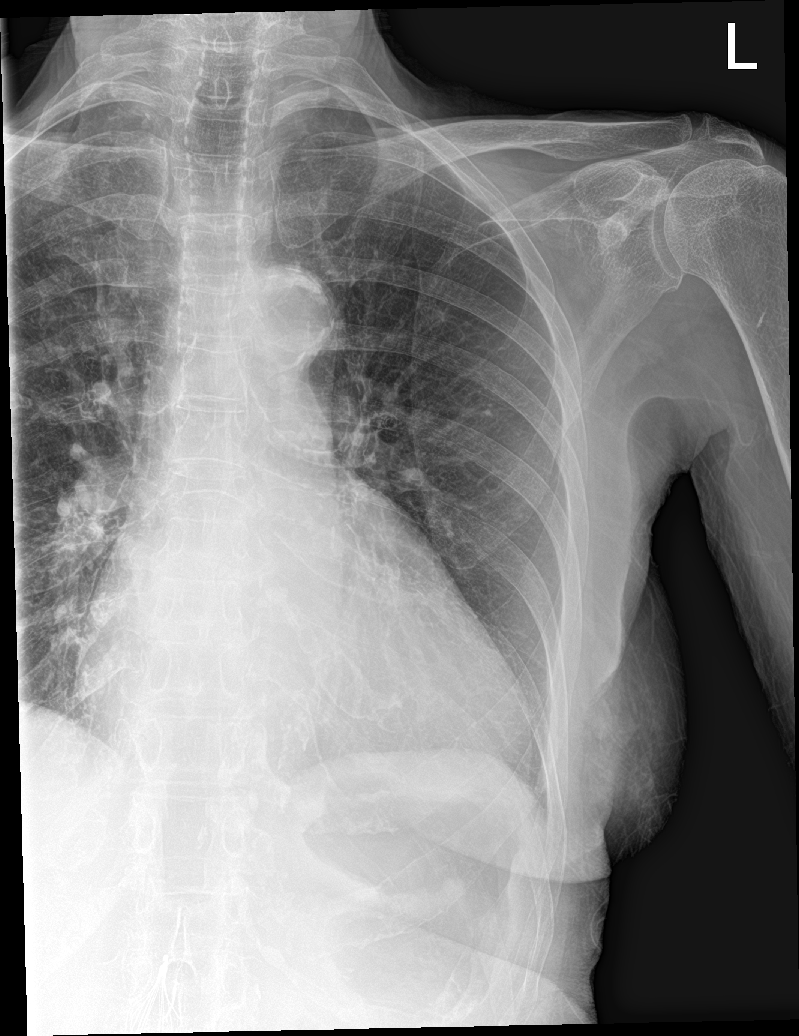

[rib ap (2 of 2)]
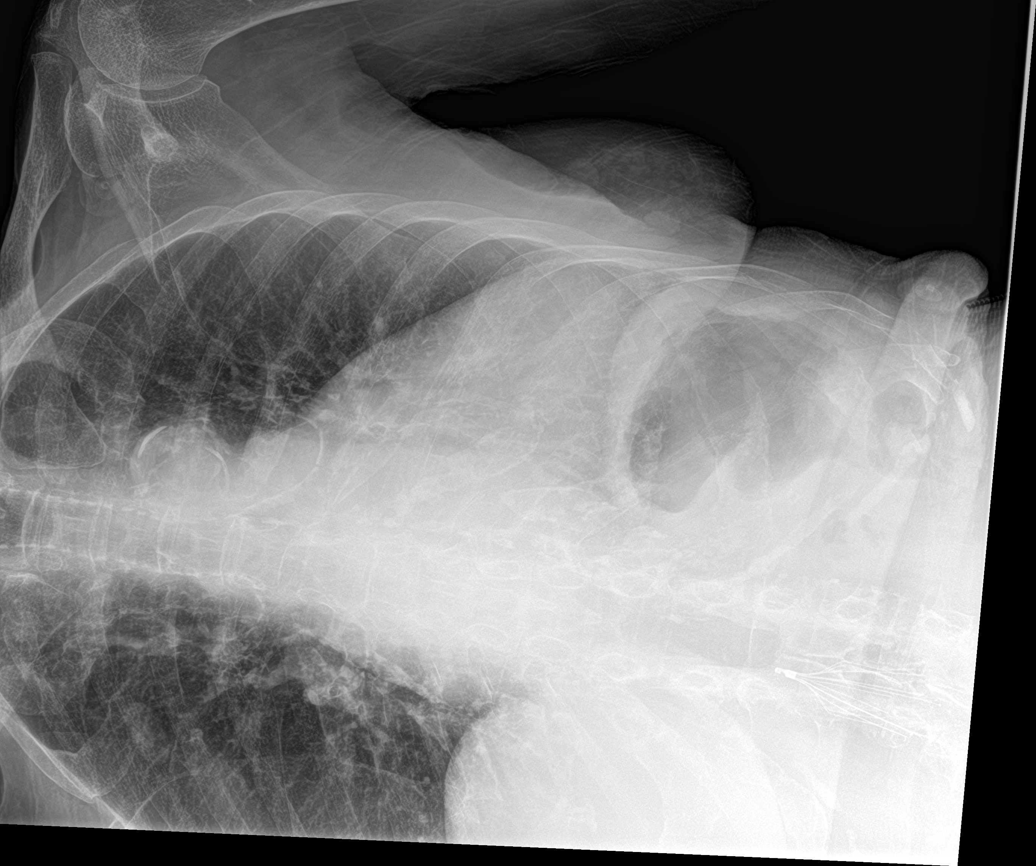

[rib ap obl (2 of 2)]
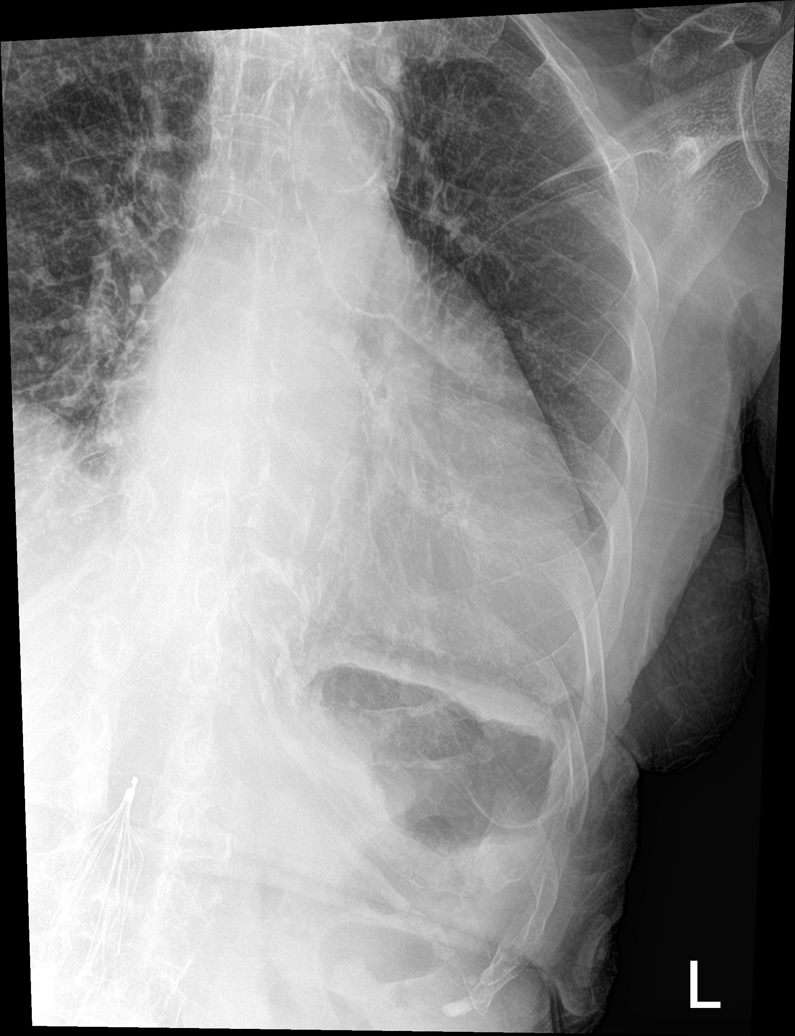

[5 of 5 positions shown; findings below may reference images not displayed]

FINDINGS: Frontal view the chest and four views of left-sided ribs. Frontal
view of the chest demonstrates midline trachea. Mild cardiomegaly.
Atherosclerosis in the transverse aorta. No pleural effusion or
pneumothorax.

Left rib radiographs demonstrate subtle osseous irregularity
involving anterior aspect of lower left ribs, most apparent on the
final image.
IMPRESSION: Suspect minimally displaced fractures of anterior lower left ribs.
Correlate with point tenderness. No evidence of pleural fluid or
pneumothorax.

Cardiomegaly without congestive failure.

Aortic Atherosclerosis ([id number]).

## 2020-03-29 ENCOUNTER — Ambulatory Visit: Payer: Medicare Other | Admitting: Podiatry

## 2020-05-02 ENCOUNTER — Ambulatory Visit: Payer: Medicare Other | Admitting: Podiatry

## 2020-06-03 ENCOUNTER — Emergency Department (HOSPITAL_COMMUNITY): Payer: Medicare Other

## 2020-06-03 ENCOUNTER — Other Ambulatory Visit: Payer: Self-pay

## 2020-06-03 ENCOUNTER — Emergency Department (HOSPITAL_COMMUNITY)
Admission: EM | Admit: 2020-06-03 | Discharge: 2020-06-03 | Disposition: A | Discharge status: Home / Self Care | Payer: Medicare Other | Attending: Emergency Medicine | Admitting: Emergency Medicine

## 2020-06-03 ENCOUNTER — Encounter (HOSPITAL_COMMUNITY): Payer: Self-pay | Admitting: *Deleted

## 2020-06-03 DIAGNOSIS — M25551 Pain in right hip: Secondary | ICD-10-CM | POA: Diagnosis present

## 2020-06-03 DIAGNOSIS — M25561 Pain in right knee: Secondary | ICD-10-CM | POA: Diagnosis not present

## 2020-06-03 DIAGNOSIS — Z7982 Long term (current) use of aspirin: Secondary | ICD-10-CM | POA: Insufficient documentation

## 2020-06-03 DIAGNOSIS — Z79899 Other long term (current) drug therapy: Secondary | ICD-10-CM | POA: Insufficient documentation

## 2020-06-03 DIAGNOSIS — W19XXXA Unspecified fall, initial encounter: Secondary | ICD-10-CM

## 2020-06-03 DIAGNOSIS — I110 Hypertensive heart disease with heart failure: Secondary | ICD-10-CM | POA: Diagnosis not present

## 2020-06-03 DIAGNOSIS — Y92009 Unspecified place in unspecified non-institutional (private) residence as the place of occurrence of the external cause: Secondary | ICD-10-CM

## 2020-06-03 DIAGNOSIS — Z87891 Personal history of nicotine dependence: Secondary | ICD-10-CM | POA: Insufficient documentation

## 2020-06-03 DIAGNOSIS — R519 Headache, unspecified: Secondary | ICD-10-CM | POA: Insufficient documentation

## 2020-06-03 DIAGNOSIS — I5032 Chronic diastolic (congestive) heart failure: Secondary | ICD-10-CM | POA: Insufficient documentation

## 2020-06-03 LAB — URINALYSIS, ROUTINE W REFLEX MICROSCOPIC
Bilirubin Urine: NEGATIVE
Glucose, UA: NEGATIVE mg/dL
Ketones, ur: 5 mg/dL — AB
Leukocytes,Ua: NEGATIVE
Nitrite: POSITIVE — AB
Protein, ur: NEGATIVE mg/dL
Specific Gravity, Urine: 1.011 (ref 1.005–1.030)
pH: 5 (ref 5.0–8.0)

## 2020-06-03 MED ORDER — ACETAMINOPHEN 325 MG PO TABS
650.0000 mg | ORAL_TABLET | Freq: Once | ORAL | Status: AC
  Administered 2020-06-03: 650 mg via ORAL
  Filled 2020-06-03: qty 2

## 2020-06-03 MED ORDER — FOSFOMYCIN TROMETHAMINE 3 G PO PACK
3.0000 g | PACK | Freq: Once | ORAL | Status: AC
  Administered 2020-06-03: 3 g via ORAL
  Filled 2020-06-03: qty 3

## 2020-06-03 NOTE — ED Notes (Signed)
Pt was only able take a couple steps by the bed with walker. Could not continue due to pain/ weakness in right knee.

## 2020-06-03 NOTE — Discharge Instructions (Signed)
Apply ice to areas of pain for 20 minutes at a time. Take Tylenol every 6 hours for pain and swelling. Follow closely with your primary care provider regarding your visit today. You can follow-up with your orthopedic specialist if your knee continues to bother you. Your urine has been sent for culture.  You will receive a call from the hospital if you need any additional therapy.

## 2020-06-03 NOTE — ED Provider Notes (Signed)
[facility]-EMERGENCY DEPT Provider Note   CSN: [id number] Arrival date & time: 06/03/20  1238     History Chief Complaint  Patient presents with  . Fall  . Leg Pain  . Back Pain    [name] Weber is a 86 y.o. female with past medical history of dementia, paroxysmal A. fib, status post lumbar fusion, diastolic heart failure, hypertension, presenting to the emergency department for right hip pain after an unwitnessed fall that occurred yesterday. Patient's daughter is at bedside, reports she had an unwitnessed fall yesterday in the yard. She was found on the ground, initially complained of some posterior headache and some hip pain. She also had some low back pain yesterday. She was able to ambulate afterwards, however when she was checked on this morning at her home (she lives alone [place], she was unable to walk due to pain in her right hip. She states her pain is in her groin and is minimal at rest, only provoked with movement. She denies any new back pain. Denies headache, neck pain, vision changes, n/v, abdominal pain, wounds, or other injuries. She is not on anticoagulation. Patient's daughter reports she is at her baseline dementia.   The history is provided by the patient and a relative.       Past Medical History:  Diagnosis Date  . A-fib (HCC)   . Abdominal pain   . Acute pulmonary embolism (HCC)   . Age-related osteoporosis with current pathological fracture   . Anxiety state 12/10/2014  . Atrial flutter (HCC) 03/10/2011  . B12 deficiency 11/08/2018  . Bacterial UTI 11/30/2014  . Benign essential HTN   . Benign hypertensive heart disease without heart failure 03/10/2011  . Bilateral foot-drop   . Bilateral pleural effusion 11/15/2018  . Chronic constipation   . Chronic diastolic heart failure (HCC) 05/23/2019  . Chronic midline low back pain without sciatica   . Closed nondisplaced fracture of pelvis with routine healing   . Compression  fracture of L2 (HCC) 05/30/2016  . Constipation due to pain medication   . Cystitis 11/05/2018  . Depression   . Elevated blood pressure 05/30/2016  . Elevated brain natriuretic peptide (BNP) level 11/15/2018  . Encounter for therapeutic drug monitoring 12/25/2013  . Fatigue   . Generalized anxiety disorder   . HLD (hyperlipidemia) 03/10/2011  . Hypoalbuminemia due to protein-calorie malnutrition (HCC)   . L2 vertebral fracture (HCC) 05/30/2016  . Leukocytosis 11/12/2018  . Lumbar burst fracture (HCC) 06/08/2016  . Lumbar compression fracture (HCC) 06/08/2016  . Malaise   . Malaise and fatigue 06/01/2011  . Malnutrition of moderate degree 11/15/2018  . Memory loss   . Midline thoracic back pain   . Multiple closed fractures of pelvis with stable disruption of pelvic circle (HCC)   . Multiple rib fractures 11/05/2018  . Myalgia   . Myelopathy (HCC) 01/06/2017  . Near syncope 11/21/2014  . Neurogenic bladder 11/30/2014  . Neurogenic bowel 11/30/2014  . Nocturnal leg cramps 07/16/2014  . Orthostatic hypotension 11/11/2018  . Osteoporosis with current pathological fracture   . Other osteoporosis with current pathological fracture   . PAC (premature atrial contraction)    ISOLATED  . Pain   . Palpitations    OCCASSIONAL  . Paroxysmal atrial flutter (HCC)   . Pelvic fracture (HCC) 12/26/2016  . Pelvic ring fracture (HCC)   . Persistent atrial fibrillation (HCC) 11/27/2014  . Pulmonary hypertension (HCC)   . PVC's (premature ventricular contractions)  ISOLATED  . Rectal abnormality 11/11/2018  . Rib fracture 11/10/2018  . Skin tear of lower leg without complication, left, initial encounter   . Skin tear of lower leg without complication, right, initial encounter   . Slow transit constipation   . Thoracic myelopathy 11/29/2014  . Thrombocytopenia (HCC)   . Thrombus   . Traumatic spinal subdural hematoma   . Weakness of both legs     Patient Active Problem List   Diagnosis Date Noted  .  Edema 05/23/2019  . Chronic diastolic heart failure (HCC) 05/23/2019  . Malnutrition of moderate degree 11/15/2018  . Bilateral pleural effusion 11/15/2018  . Elevated brain natriuretic peptide (BNP) level 11/15/2018  . Cardiomegaly 11/15/2018  . Leukocytosis 11/12/2018  . Abdominal pain   . Skin tear of lower leg without complication, left, initial encounter   . Skin tear of lower leg without complication, right, initial encounter   . Orthostatic hypotension 11/11/2018  . Rectal abnormality 11/11/2018  . Rib fracture 11/10/2018  . B12 deficiency 11/08/2018  . Multiple rib fractures 11/05/2018  . Cystitis 11/05/2018  . Other osteoporosis with current pathological fracture   . Chronic midline low back pain without sciatica   . Thrombus   . Closed nondisplaced fracture of pelvis with routine healing   . Pelvic ring fracture (HCC)   . Osteoporosis with current pathological fracture   . Slow transit constipation   . Fractures   . Pain   . Generalized anxiety disorder   . Benign essential HTN   . Hypoalbuminemia due to protein-calorie malnutrition (HCC)   . Thrombocytopenia (HCC)   . Pulmonary hypertension (HCC)   . Age-related osteoporosis with current pathological fracture   . Pelvic fracture (HCC) 12/26/2016  . Multiple closed fractures of pelvis with stable disruption of pelvic circle (HCC)   . Memory loss   . Lumbar burst fracture (HCC) 06/08/2016  . Lumbar compression fracture (HCC) 06/08/2016  . Bilateral foot-drop   . Midline thoracic back pain   . A-fib (HCC)   . Depression   . Constipation due to pain medication   . Chronic constipation   . Compression fracture of L2 (HCC) 05/30/2016  . Elevated blood pressure 05/30/2016  . L2 vertebral fracture (HCC) 05/30/2016  . Anxiety state 12/10/2014  . Neurogenic bladder 11/30/2014  . Neurogenic bowel 11/30/2014  . Thoracic myelopathy 11/29/2014  . Persistent atrial fibrillation (HCC) 11/27/2014  . Traumatic spinal  subdural hematoma   . Near syncope 11/21/2014  . Weakness of both legs   . Nocturnal leg cramps 07/16/2014  . Encounter for therapeutic drug monitoring 12/25/2013  . Malaise and fatigue 06/01/2011  . Atrial flutter (HCC) 03/10/2011  . Benign hypertensive heart disease without heart failure 03/10/2011  . HLD (hyperlipidemia) 03/10/2011  . Paroxysmal atrial flutter (HCC)   . Palpitations   . PAC (premature atrial contraction)   . PVC's (premature ventricular contractions)   . Malaise   . Fatigue   . Myalgia     Past Surgical History:  Procedure Laterality Date  . THORACIC LAMINECTOMY FOR EPIDURAL ABSCESS N/A 11/16/2014   Procedure: THORACIC LAMINECTOMY FOR EPIDURAL ABSCESS;  Surgeon: Karn Cassis, MD;  Location: [facility];  Service: Neurosurgery;  Laterality: N/A;  . VULVAR LESION REMOVAL  01/01/2009   She had a 1-cm area of erythema to the right of the urethral meatus     OB History   No obstetric history on file.     Family History  Problem Relation  Age of Onset  . Hyperlipidemia Mother   . Breast cancer Neg Hx     Social History   Tobacco Use  . Smoking status: Former Smoker    Quit date: 03/09/1991    Years since quitting: 29.2  . Smokeless tobacco: Former Clinical biochemist  . Vaping Use: Never used  Substance Use Topics  . Alcohol use: No  . Drug use: No    Home Medications Prior to Admission medications   Medication Sig Start Date End Date Taking? Authorizing Provider  aspirin 81 MG EC tablet Take 1 tablet (81 mg total) by mouth daily. 11/19/18  Yes Jerald Kief, MD  Calcium Carb-Cholecalciferol (CALCIUM 600 + D PO) Take 1 tablet by mouth daily.    Yes [provider]  cholecalciferol (VITAMIN D3) 25 MCG (1000 UNIT) tablet Take 1,000 Units by mouth daily.   Yes [provider]  docusate sodium (COLACE) 100 MG capsule Take 1 capsule (100 mg total) by mouth daily. Patient taking differently: Take 100 mg by mouth every other day.   11/19/18  Yes Jerald Kief, MD  furosemide (LASIX) 20 MG tablet Take 1 tablet (20 mg total) by mouth once a week. For edema Patient taking differently: Take 20 mg by mouth daily as needed for fluid or edema.  05/03/19  Yes Pricilla Riffle, MD  LUMIGAN 0.01 % SOLN Place 1 drop into both eyes at bedtime. 11/11/18  Yes Roberto Scales D, MD  Probiotic Product (PROBIOTIC ACIDOPHILUS BEADS) CAPS Take 1 capsule by mouth daily. 11/11/18  Yes Roberto Scales D, MD  trimethoprim (TRIMPEX) 100 MG tablet Take 100 mg by mouth 3 (three) times a week.   Yes [provider]  bismuth subsalicylate (PEPTO BISMOL) 262 MG/15ML suspension Take 30 mLs by mouth every 4 (four) hours as needed for indigestion or diarrhea or loose stools. 11/19/18   Jerald Kief, MD  polyethylene glycol [facility]) packet Take 17 g by mouth daily as needed for mild constipation. Patient not taking: Reported on 06/03/2020 11/19/18   Jerald Kief, MD  vitamin B-12 (CYANOCOBALAMIN) 500 MCG tablet Take 1 tablet (500 mcg total) by mouth daily. Patient not taking: Reported on 06/03/2020 11/19/18   Jerald Kief, MD    Allergies    Crestor [rosuvastatin calcium]  Review of Systems   Review of Systems  Musculoskeletal: Positive for arthralgias.  Neurological: Positive for headaches.  All other systems reviewed and are negative.   Physical Exam Updated Vital Signs BP (!) 158/95   Pulse 94   Temp 98 F (36.7 C) (Oral)   Resp 18   Ht 5' 8.5" (1.74 m)   Wt 53.5 kg   SpO2 97%   BMI 17.68 kg/m   Physical Exam Vitals and nursing note reviewed.  Constitutional:      General: She is not in acute distress.    Appearance: She is well-developed.  HENT:     Head: Normocephalic and atraumatic.     Comments: No scalp hematoma or tenderness Eyes:     Conjunctiva/sclera: Conjunctivae normal.  Cardiovascular:     Rate and Rhythm: Normal rate and regular rhythm.  Pulmonary:     Effort: Pulmonary effort is normal. No  respiratory distress.     Breath sounds: Normal breath sounds.  Abdominal:     General: Bowel sounds are normal.     Palpations: Abdomen is soft.     Tenderness: There is no abdominal tenderness.  Musculoskeletal:  Comments: Patient is able to actively internal and externally rotate her right hip though states this causes pain in her groin. She appears to be comfortably resting her right leg with hip externally rotated and knee slightly bent. Knee and ankle are atraumatic. Pelvis is stable. Spontaneously moving all extremities  Skin:    General: Skin is warm.  Neurological:     Mental Status: She is alert. Mental status is at baseline.     Comments: Patient is alert and oriented to self. She states the month is December and the year is 2020. Speech is fluent without aphasia. Following simple commands without difficulty. Cranial nerves are grossly intact with normal EOM, PERRL. Equal strength to BUE BLE. Sensation to light touch is grossly normal in all 4 extremities. Normal tone. Normal coordination with finger-to-nose.  Psychiatric:        Behavior: Behavior normal.     ED Results / Procedures / Treatments   Labs (all labs ordered are listed, but only abnormal results are displayed) Labs Reviewed  URINALYSIS, ROUTINE W REFLEX MICROSCOPIC - Abnormal; Notable for the following components:      Result Value   Hgb urine dipstick SMALL (*)    Ketones, ur 5 (*)    Nitrite POSITIVE (*)    Bacteria, UA FEW (*)    All other components within normal limits  URINE CULTURE    EKG None  Radiology DG Lumbar Spine Complete  Result Date: 06/03/2020 CLINICAL DATA:  Patient presents after fall at a fourth of July cook out. At this time she fell she had no complaints. Today with standing she experiences RIGHT leg weakness. Difficulty walking. EXAM: LUMBAR SPINE - COMPLETE 4+ VIEW COMPARISON:  11/06/2018 FINDINGS: There is chronic anterior wedge compression fracture of L2, appears status post  laminectomy at L1. Alignment is normal. Moderate facet hypertrophy in the LOWER lumbar levels. There is dense atherosclerotic calcification of the abdominal aorta. Inferior vena cava filter apex overlies L2. Deformity of the RIGHT superior pubic ramus consistent with remote fracture. Moderate stool within the rectosigmoid colon. IMPRESSION: 1. Chronic anterior wedge compression fracture of L2. 2. No acute abnormality. Electronically Signed   By: Norva Pavlov M.D.   On: 06/03/2020 15:04   CT Head Wo Contrast  Result Date: 06/03/2020 CLINICAL DATA:  Status post fall. EXAM: CT HEAD WITHOUT CONTRAST CT CERVICAL SPINE WITHOUT CONTRAST TECHNIQUE: Multidetector CT imaging of the head and cervical spine was performed following the standard protocol without intravenous contrast. Multiplanar CT image reconstructions of the cervical spine were also generated. COMPARISON:  None. October 19, 2019 FINDINGS: CT HEAD FINDINGS Brain: There is mild cerebral atrophy with widening of the extra-axial spaces and ventricular dilatation. There are areas of decreased attenuation within the white matter tracts of the supratentorial brain, consistent with microvascular disease changes. Vascular: No hyperdense vessel or unexpected calcification. Skull: Normal. Negative for fracture or focal lesion. Sinuses/Orbits: No acute finding. Other: None. CT CERVICAL SPINE FINDINGS Alignment: There is approximately 2 mm anterolisthesis of the C6 vertebral body on C7. Skull base and vertebrae: No acute fracture. No primary bone lesion or focal pathologic process. Soft tissues and spinal canal: No prevertebral fluid or swelling. No visible canal hematoma. Disc levels: Mild to moderate severity multilevel endplate sclerosis is seen throughout the cervical spine. Moderate to marked severity multilevel intervertebral disc space narrowing is seen. Mild to moderate severity bilateral multilevel facet joint hypertrophy is noted. Upper chest: Mild  posterior right apical scarring is seen. Other: N/A  IMPRESSION: 1. No acute intracranial abnormality. 2. Mild cerebral atrophy and microvascular disease changes of the supratentorial brain. 3. Moderate to marked severity degenerative changes of the cervical spine without evidence of an acute fracture. Electronically Signed   By: Aram Candela M.D.   On: 06/03/2020 15:08   CT Cervical Spine Wo Contrast  Result Date: 06/03/2020 CLINICAL DATA:  Status post fall. EXAM: CT HEAD WITHOUT CONTRAST CT CERVICAL SPINE WITHOUT CONTRAST TECHNIQUE: Multidetector CT imaging of the head and cervical spine was performed following the standard protocol without intravenous contrast. Multiplanar CT image reconstructions of the cervical spine were also generated. COMPARISON:  None. FINDINGS: CT HEAD FINDINGS Brain: There is mild cerebral atrophy with widening of the extra-axial spaces and ventricular dilatation. There are areas of decreased attenuation within the white matter tracts of the supratentorial brain, consistent with microvascular disease changes. Vascular: No hyperdense vessel or unexpected calcification. Skull: Normal. Negative for fracture or focal lesion. Sinuses/Orbits: No acute finding. Other: None. CT CERVICAL SPINE FINDINGS Alignment: There is approximately 2 mm anterolisthesis of the C6 vertebral body on C7. Skull base and vertebrae: No acute fracture. No primary bone lesion or focal pathologic process. Soft tissues and spinal canal: No prevertebral fluid or swelling. No visible canal hematoma. Disc levels: Mild to moderate severity multilevel endplate sclerosis is seen throughout the cervical spine. Moderate to marked severity multilevel intervertebral disc space narrowing is seen. Mild to moderate severity bilateral multilevel facet joint hypertrophy is noted. Upper chest: Mild posterior right apical scarring is seen. Other: N/A IMPRESSION: 1. No acute intracranial abnormality. 2. Mild cerebral atrophy and  microvascular disease changes of the supratentorial brain. 3. Moderate to marked severity degenerative changes of the cervical spine without evidence of an acute fracture. Electronically Signed   By: Aram Candela M.D.   On: 06/03/2020 15:13   CT Lumbar Spine Wo Contrast  Result Date: 06/03/2020 CLINICAL DATA:  Unwitnessed fall, back pain. Additional provided: Right leg weakness. Right low back pain. EXAM: CT LUMBAR SPINE WITHOUT CONTRAST TECHNIQUE: Multidetector CT imaging of the lumbar spine was performed without intravenous contrast administration. Multiplanar CT image reconstructions were also generated. COMPARISON:  Lumbar spine radiographs 06/03/2020, CT abdomen/pelvis 11/15/2018, lumbar spine MRI 05/31/2016 FINDINGS: Segmentation: 5 lumbar vertebrae. The lowest well-formed intervertebral disc is designated L5-S1. Alignment: Lumbar levocurvature. Mild bony retropulsion at the level of the L2 superior endplate, unchanged. 3 mm L2-L3 grade 1 retrolisthesis. 2 mm L5-S1 grade 1 anterolisthesis. Vertebrae: Redemonstrated chronic L2 compression fracture without progressive height loss as compared to prior CT 11/15/2018. Vertebral body height is otherwise maintained. No evidence of acute fracture to the lumbar spine. Sequela of prior posterior decompression at T12-L1 and L1-L2. Multilevel fusion of the posterior elements. Paraspinal and other soft tissues: Aortoiliac atherosclerosis. IVC filter. Postsurgical changes to the dorsal lumbar soft tissues. Atrophy of the lumbar paraspinal musculature. Disc levels: Multilevel disc space narrowing. Most notably there is moderate/advanced disc space narrowing at L2-L3 and L4-L5 T12-L1: Prior posterior decompression. Minimal disc bulge. No appreciable significant spinal canal stenosis or neural foraminal narrowing. L1-L2: Prior posterior decompression. Unchanged bony retropulsion at the level of the L2 superior endplate. Endplate spurring. Facet hypertrophy. As before,  bony retropulsion contributes to moderate right and mild left subarticular narrowing. Minimal relative narrowing of the central canal. Bilateral neural foraminal narrowing (mild/moderate right, mild left). L2-L3: Grade 1 retrolisthesis. Small disc bulge. Facet arthrosis/ligamentum flavum hypertrophy. No appreciable significant spinal canal stenosis or neural foraminal narrowing. L3-L4: Disc bulge. Facet arthrosis/ligamentum flavum  hypertrophy. Mild relative narrowing of the spinal canal. No significant foraminal stenosis. L4-L5: Disc bulge asymmetric to the left with endplate spurring. Facet/ligamentum flavum hypertrophy. Moderate left subarticular narrowing with potential to affect the descending left L5 nerve root. Mild/moderate central canal stenosis. Moderate right neural foraminal narrowing. L5-S1: Grade 1 anterolisthesis. Disc uncovering. Advanced facet arthrosis with ligamentum flavum hypertrophy. Mild/moderate left with mild right subarticular narrowing. Mild relative narrowing of the central canal. Moderate left neural foraminal narrowing. IMPRESSION: No evidence of acute fracture to the lumbar spine. Redemonstrated chronic L2 compression fracture without progressive height loss. Unchanged mild bony retropulsion at this level. Lumbar levocurvature. Mild L4-L5 left lateral listhesis. 3 mm L2-L3 grade 1 retrolisthesis. 2 mm L5-S1 grade 1 anterolisthesis. Postsurgical changes to the thoracolumbar spine as described. Lumbar spondylosis as outlined. Findings are most notably as follows. At L4-L5, there is multifactorial moderate left subarticular narrowing with potential to affect the descending left L5 nerve root. Mild/moderate central canal stenosis. Moderate right neural foraminal narrowing. No more than mild central canal stenosis at the remaining levels. Additional sites of neural foraminal narrowing as detailed and greatest on the left at L5-S1 (moderate in severity at this site. Please refer to CT pelvis  separately reported. Electronically Signed   By: Jackey Loge DO   On: 06/03/2020 16:44   CT PELVIS WO CONTRAST  Result Date: 06/03/2020 CLINICAL DATA:  Status post fall yesterday. Pelvic pain and difficulty walking. Initial encounter. EXAM: CT OF THE LEFT HIP WITHOUT CONTRAST TECHNIQUE: Multidetector CT imaging of the left hip was performed according to the standard protocol. Multiplanar CT image reconstructions were also generated. COMPARISON:  Plain films right hip today. FINDINGS: Bones/Joint/Cartilage There is no acute bony or joint abnormality. Remote healed right superior and inferior pubic rami fractures are noted. Bones are somewhat osteopenic. No lytic or sclerotic lesion. There is degenerative disease and convex left scoliosis in the lower lumbar spine. Ligaments Suboptimally assessed by CT. Muscles and Tendons Negative. Soft tissues Imaged intrapelvic contents demonstrate extensive atherosclerotic vascular disease. Diverticulosis is also noted. IVC filter is partially imaged. IMPRESSION: No acute abnormality. Remote healed right superior and inferior pubic ramus fractures. Osteopenia. Aortic Atherosclerosis (ICD10-I70.0). Diverticulosis. Electronically Signed   By: Drusilla Kanner M.D.   On: 06/03/2020 16:48   DG Knee Complete 4 Views Right  Result Date: 06/03/2020 CLINICAL DATA:  Fall with right knee pain. EXAM: RIGHT KNEE - COMPLETE 4+ VIEW COMPARISON:  None. FINDINGS: Diffuse osteopenia. Minimal osteoarthritic change. No evidence of acute fracture or dislocation. No significant joint effusion. IMPRESSION: No acute findings. Electronically Signed   By: Elberta Fortis M.D.   On: 06/03/2020 18:19   DG Hip Unilat With Pelvis 2-3 Views Right  Result Date: 06/03/2020 CLINICAL DATA:  Status post fall. EXAM: DG HIP (WITH OR WITHOUT PELVIS) 2-3V RIGHT COMPARISON:  None. FINDINGS: There is no evidence of an acute hip fracture or dislocation. Chronic fracture deformities are seen involving the right  superior and right inferior pubic rami. Mild degenerative changes seen involving both hips there is mild vascular calcification. IMPRESSION: No acute osseous abnormality. Electronically Signed   By: Aram Candela M.D.   On: 06/03/2020 15:10    Procedures Procedures (including critical care time)  Medications Ordered in ED Medications  acetaminophen (TYLENOL) tablet 650 mg (650 mg Oral Given 06/03/20 1521)  fosfomycin (MONUROL) packet 3 g (3 g Oral Given 06/03/20 2016)    ED Course  I have reviewed the triage vital signs and the nursing notes.  Pertinent labs & imaging results that were available during my care of the patient were reviewed by me and considered in my medical decision making (see chart for details).  Clinical Course as of Jun 03 2045  Mon Jun 03, 2020  1407 NT reports patient ambulated and had pain in her knee preventing her from walking very far.  Will add on plain films of the right knee.   [JR]  1831 Pt re-evaluated once more. Family is now reporting pt has been complaining of urinary incontinence. She is on chronic prophylactic medication for recurrent UTI. Pt states the incontinence has been occurring for quite a while now. Family requesting U/A. Will obtain U/A, anticipate discharge.   [JR]    Clinical Course User Index [JR] Choya Tornow, [place] N, PA-C   MDM Rules/Calculators/A&P                          Patient with history of dementia at baseline, presenting with unwitnessed fall that occurred yesterday. It is suspected that she may have hit her head, she was found laying on her back and was complaining of some posterior head pain yesterday. Family member checked her head and found no hematoma. She was able to ambulate after the fall yesterday however today when her family checked on her at home she was unable to put weight on her right leg secondary to right hip pain. On initial evaluation, she states her pain is minimal at rest that was worse when she actively  internally and externally rotates her hip, located in her groin. She has normal sensation and strength. No obvious scalp hematoma. She is overall well-appearing and in no distress, decline pain medication initially though becomes agreeable to tylenol. CT head imaging ordered given unwitnessed fall and initially complaining of head pain. She is not on anticoagulation. Imaging of her L-spine and right hip and pelvis.  Plain films and CT head and c-spine are negative. Pt evaluated by Dr. Madilyn Hook, given extensive hx of back and pelvic fractures, CT of L spine and pelvis ordered to rule out occult injury -- both are negative. Pt was ambulated and states she now has pain is in her right knee, hip is no longer with pain. Plain films of the knee added on and are negative. A different family member is now present at bedside and is asking about pts ongoing urinary incontinence and hx of recurrent UTI, in trimethoprim. Requesting urinalysis. U/A with nitrates and few bacteria, though no leuks and nl 0-5 WBCs. Culture sent, a dose of fosfomycin given, per chart review pt does not chronically have nitrites present.   Knee splint applied for support and comfort, pt ambulating well with a walker (baseline walks with walker) and is repeatedly requesting discharge. Pt is appropriate for discharge with outpt follow up.   Final Clinical Impression(s) / ED Diagnoses Final diagnoses:  Acute pain of right knee  Fall in home, initial encounter  Right hip pain    Rx / DC Orders ED Discharge Orders    None       Isela Stantz, [place] N, PA-C 06/03/20 2048    Tilden Fossa, MD 06/04/20 [phone]

## 2020-06-03 NOTE — ED Notes (Signed)
Pt ambulated with brace and walker without issue.

## 2020-06-03 NOTE — ED Triage Notes (Signed)
Patient is from home. Her neighbors do assist her. She presents post fall at a 4th of July cookout. At the time of the fall she had no complaints, but today with standing she experiences right leg weakness. Walking is difficult. She also complains of right low back pain. The patient does not remember the fall, but the neighbors report that is normal for her. She is A&O x3  HX UTI, confused  EMS vitals: 142/86 BP 86 HR 20 Resp Rate 96% SPO2 Room Air 129 CBG

## 2020-06-06 LAB — URINE CULTURE: Culture: >=100000 — AB

## 2020-06-07 ENCOUNTER — Encounter: Payer: Self-pay | Admitting: Podiatry

## 2020-06-07 ENCOUNTER — Other Ambulatory Visit: Payer: Self-pay

## 2020-06-07 ENCOUNTER — Ambulatory Visit: Payer: Medicare Other | Admitting: Podiatry

## 2020-06-07 ENCOUNTER — Telehealth: Payer: Self-pay

## 2020-06-07 VITALS — Temp 97.0°F

## 2020-06-07 DIAGNOSIS — B351 Tinea unguium: Secondary | ICD-10-CM | POA: Diagnosis not present

## 2020-06-07 DIAGNOSIS — M79674 Pain in right toe(s): Secondary | ICD-10-CM

## 2020-06-07 DIAGNOSIS — I739 Peripheral vascular disease, unspecified: Secondary | ICD-10-CM | POA: Diagnosis not present

## 2020-06-07 DIAGNOSIS — Q828 Other specified congenital malformations of skin: Secondary | ICD-10-CM | POA: Diagnosis not present

## 2020-06-07 DIAGNOSIS — L84 Corns and callosities: Secondary | ICD-10-CM

## 2020-06-07 DIAGNOSIS — L602 Onychogryphosis: Secondary | ICD-10-CM | POA: Diagnosis not present

## 2020-06-07 DIAGNOSIS — M79675 Pain in left toe(s): Secondary | ICD-10-CM | POA: Diagnosis not present

## 2020-06-07 NOTE — Patient Instructions (Signed)
[facility] Patient Line for antifungal solution for toenails ([phone]) [phone]  Shoe recommendation: Arcopedicos  Onychomycosis/Fungal Toenails  WHAT IS IT? An infection that lies within the keratin of your nail plate that is caused by a fungus.  WHY ME? Fungal infections affect all ages, sexes, races, and creeds.  There may be many factors that predispose you to a fungal infection such as age, coexisting medical conditions such as diabetes, or an autoimmune disease; stress, medications, fatigue, genetics, etc.  Bottom line: fungus thrives in a warm, moist environment and your shoes offer such a location.  IS IT CONTAGIOUS? Theoretically, yes.  You do not want to share shoes, nail clippers or files with someone who has fungal toenails.  Walking around barefoot in the same room or sleeping in the same bed is unlikely to transfer the organism.  It is important to realize, however, that fungus can spread easily from one nail to the next on the same foot.  HOW DO WE TREAT THIS?  There are several ways to treat this condition.  Treatment may depend on many factors such as age, medications, pregnancy, liver and kidney conditions, etc.  It is best to ask your doctor which options are available to you.  1. No treatment.   Unlike many other medical concerns, you can live with this condition.  However for many people this can be a painful condition and may lead to ingrown toenails or a bacterial infection.  It is recommended that you keep the nails cut short to help reduce the amount of fungal nail. 2. Topical treatment.  These range from herbal remedies to prescription strength nail lacquers.  About 40-50% effective, topicals require twice daily application for approximately 9 to 12 months or until an entirely new nail has grown out.  The most effective topicals are medical grade medications available through physicians offices. 3. Oral antifungal medications.  With an 80-90% cure rate, the most common  oral medication requires 3 to 4 months of therapy and stays in your system for a year as the new nail grows out.  Oral antifungal medications do require blood work to make sure it is a safe drug for you.  A liver function panel will be performed prior to starting the medication and after the first month of treatment.  It is important to have the blood work performed to avoid any harmful side effects.  In general, this medication safe but blood work is required. 4. Laser Therapy.  This treatment is performed by applying a specialized laser to the affected nail plate.  This therapy is noninvasive, fast, and non-painful.  It is not covered by insurance and is therefore, out of pocket.  The results have been very good with a 80-95% cure rate.  The [facility] is the only practice in the area to offer this therapy. 5. Permanent Nail Avulsion.  Removing the entire nail so that a new nail will not grow back.   Hammer Toe  Hammer toe is a change in the shape (a deformity) of your toe. The deformity causes the middle joint of your toe to stay bent. This causes pain, especially when you are wearing shoes. Hammer toe starts gradually. At first, the toe can be straightened. Gradually over time, the deformity becomes stiff and permanent. Early treatments to keep the toe straight may relieve pain. As the deformity becomes stiff and permanent, surgery may be needed to straighten the toe. What are the causes? Hammer toe is caused by abnormal  bending of the toe joint that is closest to your foot. It happens gradually over time. This pulls on the muscles and connections (tendons) of the toe joint, making them weak and stiff. It is often related to wearing shoes that are too short or narrow and do not let your toes straighten. What increases the risk? You may be at greater risk for hammer toe if you:  Are female.  Are older.  Wear shoes that are too small.  Wear high-heeled shoes that pinch your toes.  Are  a Advertising account planner.  Have a second toe that is longer than your big toe (first toe).  Injure your foot or toe.  Have arthritis.  Have a family history of hammer toe.  Have a nerve or muscle disorder. What are the signs or symptoms? The main symptoms of this condition are pain and deformity of the toe. The pain is worse when wearing shoes, walking, or running. Other symptoms may include:  Corns or calluses over the bent part of the toe or between the toes.  Redness and a burning feeling on the toe.  An open sore that forms on the top of the toe.  Not being able to straighten the toe. How is this diagnosed? This condition is diagnosed based on your symptoms and a physical exam. During the exam, your health care provider will try to straighten your toe to see how stiff the deformity is. You may also have tests, such as:  A blood test to check for rheumatoid arthritis.  An X-ray to show how severe the deformity is. How is this treated? Treatment for this condition will depend on how stiff the deformity is. Surgery is often needed. However, sometimes a hammer toe can be straightened without surgery. Treatments that do not involve surgery include:  Taping the toe into a straightened position.  Using pads and cushions to protect the toe (orthotics).  Wearing shoes that provide enough room for the toes.  Doing toe-stretching exercises at home.  Taking an NSAID to reduce pain and swelling. If these treatments do not help or the toe cannot be straightened, surgery is the next option. The most common surgeries used to straighten a hammer toe include:  Arthroplasty. In this procedure, part of the joint is removed, and that allows the toe to straighten.  Fusion. In this procedure, cartilage between the two bones of the joint is taken out and the bones are fused together into one longer bone.  Implantation. In this procedure, part of the bone is removed and replaced with an implant to let  the toe move again.  Flexor tendon transfer. In this procedure, the tendons that curl the toes down (flexor tendons) are repositioned. Follow these instructions at home:  Take over-the-counter and prescription medicines only as told by your health care provider.  Do toe straightening and stretching exercises as told by your health care provider.  Keep all follow-up visits as told by your health care provider. This is important. How is this prevented?  Wear shoes that give your toes enough room and do not cause pain.  Do not wear high-heeled shoes. Contact a health care provider if:  Your pain gets worse.  Your toe becomes red or swollen.  You develop an open sore on your toe. This information is not intended to replace advice given to you by your health care provider. Make sure you discuss any questions you have with your health care provider. Document Revised: 10/29/2017 Document Reviewed: 03/11/2016 Elsevier  Patient Education  [employer].  Bunion  A bunion is a bump on the base of the big toe that forms when the bones of the big toe joint move out of position. Bunions may be small at first, but they often get larger over time. They can make walking painful. What are the causes? A bunion may be caused by:  Wearing narrow or pointed shoes that force the big toe to press against the other toes.  Abnormal foot development that causes the foot to roll inward (pronate).  Changes in the foot that are caused by certain diseases, such as rheumatoid arthritis or polio.  A foot injury. What increases the risk? The following factors may make you more likely to develop this condition:  Wearing shoes that squeeze the toes together.  Having certain diseases, such as: ? Rheumatoid arthritis. ? Polio. ? Cerebral palsy.  Having family members who have bunions.  Being born with a foot deformity, such as flat feet or low arches.  Doing activities that put a lot of pressure  on the feet, such as ballet dancing. What are the signs or symptoms? The main symptom of a bunion is a noticeable bump on the big toe. Other symptoms may include:  Pain.  Swelling around the big toe.  Redness and inflammation.  Thick or hardened skin on the big toe or between the toes.  Stiffness or loss of motion in the big toe.  Trouble with walking. How is this diagnosed? A bunion may be diagnosed based on your symptoms, medical history, and activities. You may have tests, such as:  X-rays. These allow your health care provider to check the position of the bones in your foot and look for damage to your joint. They also help your health care provider determine the severity of your bunion and the best way to treat it.  Joint aspiration. In this test, a sample of fluid is removed from the toe joint. This test may be done if you are in a lot of pain. It helps rule out diseases that cause painful swelling of the joints, such as arthritis. How is this treated? Treatment depends on the severity of your symptoms. The goal of treatment is to relieve symptoms and prevent the bunion from getting worse. Your health care provider may recommend:  Wearing shoes that have a wide toe box.  Using bunion pads to cushion the affected area.  Taping your toes together to keep them in a normal position.  Placing a device inside your shoe (orthotics) to help reduce pressure on your toe joint.  Taking medicine to ease pain, inflammation, and swelling.  Applying heat or ice to the affected area.  Doing stretching exercises.  Surgery to remove scar tissue and move the toes back into their normal position. This treatment is rare. Follow these instructions at home: Managing pain, stiffness, and swelling   If directed, put ice on the painful area: ? Put ice in a plastic bag. ? Place a towel between your skin and the bag. ? Leave the ice on for 20 minutes, 2-3 times a day. Activity   If  directed, apply heat to the affected area before you exercise. Use the heat source that your health care provider recommends, such as a moist heat pack or a heating pad. ? Place a towel between your skin and the heat source. ? Leave the heat on for 20-30 minutes. ? Remove the heat if your skin turns bright red. This is especially  important if you are unable to feel pain, heat, or cold. You may have a greater risk of getting burned.  Do exercises as told by your health care provider. General instructions  Support your toe joint with proper footwear, shoe padding, or taping as told by your health care provider.  Take over-the-counter and prescription medicines only as told by your health care provider.  Keep all follow-up visits as told by your health care provider. This is important. Contact a health care provider if your symptoms:  Get worse.  Do not improve in 2 weeks. Get help right away if you have:  Severe pain and trouble with walking. Summary  A bunion is a bump on the base of the big toe that forms when the bones of the big toe joint move out of position.  Bunions can make walking painful.  Treatment depends on the severity of your symptoms.  Support your toe joint with proper footwear, shoe padding, or taping as told by your health care provider. This information is not intended to replace advice given to you by your health care provider. Make sure you discuss any questions you have with your health care provider. Document Revised: 05/23/2018 Document Reviewed: 03/29/2018 Elsevier Patient Education  2020 [employer]

## 2020-06-07 NOTE — Telephone Encounter (Signed)
Post ED Visit - Positive Culture Follow-up  Culture report reviewed by antimicrobial stewardship pharmacist: [facility] []  , Pharm.D. []  Enzo Bi, Pharm.D., BCPS AQ-ID []  , Pharm.D., BCPS []  Celedonio Miyamoto, Pharm.D., BCPS []  [place], Garvin Fila.D., BCPS, AAHIVP []  , Pharm.D., BCPS, AAHIVP []  Georgina Pillion, PharmD, BCPS []  , PharmD, BCPS []  [place], PharmD, BCPS []  [place], PharmD []  , PharmD, BCPS []  Estella Husk, PharmD  Pharmacy Team []  Lysle Pearl, PharmD []  , PharmD []  Phillips Climes, PharmD []  , Rph []  Agapito Games) , PharmD []  Verlan Friends, PharmD []  , PharmD []  Mervyn Gay, PharmD []  , PharmD []  Vinnie Level, PharmD []  [facility], PharmD []  , PharmD [x]  Len Childs, PharmD   Positive urine culture Treated with fosfomycin, organism sensitive to the same and no further patient follow-up is required at this time.  06/07/2020, 11:53 AM

## 2020-06-12 NOTE — Progress Notes (Signed)
Subjective: [name] Weber presents today referred by Marden Noble, MD for complaint of severely elongated, painful thick toenails that are difficult to trim.  Daughter is present during today's visit. Daughter states Mom has been getting pedicures, but salon stated they could no longer cut her nails and recommended she see a professional.   Pain interferes with ambulation. Aggravating factors include wearing enclosed shoe gear. Past Medical History:  Diagnosis Date  . A-fib (HCC)   . Abdominal pain   . Acute pulmonary embolism (HCC)   . Age-related osteoporosis with current pathological fracture   . Anxiety state 12/10/2014  . Atrial flutter (HCC) 03/10/2011  . B12 deficiency 11/08/2018  . Bacterial UTI 11/30/2014  . Benign essential HTN   . Benign hypertensive heart disease without heart failure 03/10/2011  . Bilateral foot-drop   . Bilateral pleural effusion 11/15/2018  . Chronic constipation   . Chronic diastolic heart failure (HCC) 05/23/2019  . Chronic midline low back pain without sciatica   . Closed nondisplaced fracture of pelvis with routine healing   . Compression fracture of L2 (HCC) 05/30/2016  . Constipation due to pain medication   . Cystitis 11/05/2018  . Depression   . Elevated blood pressure 05/30/2016  . Elevated brain natriuretic peptide (BNP) level 11/15/2018  . Encounter for therapeutic drug monitoring 12/25/2013  . Fatigue   . Generalized anxiety disorder   . HLD (hyperlipidemia) 03/10/2011  . Hypoalbuminemia due to protein-calorie malnutrition (HCC)   . L2 vertebral fracture (HCC) 05/30/2016  . Leukocytosis 11/12/2018  . Lumbar burst fracture (HCC) 06/08/2016  . Lumbar compression fracture (HCC) 06/08/2016  . Malaise   . Malaise and fatigue 06/01/2011  . Malnutrition of moderate degree 11/15/2018  . Memory loss   . Midline thoracic back pain   . Multiple closed fractures of pelvis with stable disruption of pelvic circle (HCC)   . Multiple rib fractures 11/05/2018   . Myalgia   . Myelopathy (HCC) 01/06/2017  . Near syncope 11/21/2014  . Neurogenic bladder 11/30/2014  . Neurogenic bowel 11/30/2014  . Nocturnal leg cramps 07/16/2014  . Orthostatic hypotension 11/11/2018  . Osteoporosis with current pathological fracture   . Other osteoporosis with current pathological fracture   . PAC (premature atrial contraction)    ISOLATED  . Pain   . Palpitations    OCCASSIONAL  . Paroxysmal atrial flutter (HCC)   . Pelvic fracture (HCC) 12/26/2016  . Pelvic ring fracture (HCC)   . Persistent atrial fibrillation (HCC) 11/27/2014  . Pulmonary hypertension (HCC)   . PVC's (premature ventricular contractions)    ISOLATED  . Rectal abnormality 11/11/2018  . Rib fracture 11/10/2018  . Skin tear of lower leg without complication, left, initial encounter   . Skin tear of lower leg without complication, right, initial encounter   . Slow transit constipation   . Thoracic myelopathy 11/29/2014  . Thrombocytopenia (HCC)   . Thrombus   . Traumatic spinal subdural hematoma   . Weakness of both legs      Patient Active Problem List   Diagnosis Date Noted  . Edema 05/23/2019  . Chronic diastolic heart failure (HCC) 05/23/2019  . Malnutrition of moderate degree 11/15/2018  . Bilateral pleural effusion 11/15/2018  . Elevated brain natriuretic peptide (BNP) level 11/15/2018  . Cardiomegaly 11/15/2018  . Leukocytosis 11/12/2018  . Abdominal pain   . Skin tear of lower leg without complication, left, initial encounter   . Skin tear of lower leg without complication, right, initial encounter   .  Orthostatic hypotension 11/11/2018  . Rectal abnormality 11/11/2018  . Rib fracture 11/10/2018  . B12 deficiency 11/08/2018  . Multiple rib fractures 11/05/2018  . Cystitis 11/05/2018  . Other osteoporosis with current pathological fracture   . Chronic midline low back pain without sciatica   . Thrombus   . Closed nondisplaced fracture of pelvis with routine healing   .  Pelvic ring fracture (HCC)   . Osteoporosis with current pathological fracture   . Slow transit constipation   . Fractures   . Pain   . Generalized anxiety disorder   . Benign essential HTN   . Hypoalbuminemia due to protein-calorie malnutrition (HCC)   . Thrombocytopenia (HCC)   . Pulmonary hypertension (HCC)   . Age-related osteoporosis with current pathological fracture   . Pelvic fracture (HCC) 12/26/2016  . Multiple closed fractures of pelvis with stable disruption of pelvic circle (HCC)   . Memory loss   . Lumbar burst fracture (HCC) 06/08/2016  . Lumbar compression fracture (HCC) 06/08/2016  . Bilateral foot-drop   . Midline thoracic back pain   . A-fib (HCC)   . Depression   . Constipation due to pain medication   . Chronic constipation   . Compression fracture of L2 (HCC) 05/30/2016  . Elevated blood pressure 05/30/2016  . L2 vertebral fracture (HCC) 05/30/2016  . Anxiety state 12/10/2014  . Neurogenic bladder 11/30/2014  . Neurogenic bowel 11/30/2014  . Thoracic myelopathy 11/29/2014  . Persistent atrial fibrillation (HCC) 11/27/2014  . Traumatic spinal subdural hematoma   . Near syncope 11/21/2014  . Weakness of both legs   . Nocturnal leg cramps 07/16/2014  . Encounter for therapeutic drug monitoring 12/25/2013  . Malaise and fatigue 06/01/2011  . Atrial flutter (HCC) 03/10/2011  . Benign hypertensive heart disease without heart failure 03/10/2011  . HLD (hyperlipidemia) 03/10/2011  . Paroxysmal atrial flutter (HCC)   . Palpitations   . PAC (premature atrial contraction)   . PVC's (premature ventricular contractions)   . Malaise   . Fatigue   . Myalgia      Past Surgical History:  Procedure Laterality Date  . THORACIC LAMINECTOMY FOR EPIDURAL ABSCESS N/A 11/16/2014   Procedure: THORACIC LAMINECTOMY FOR EPIDURAL ABSCESS;  Surgeon: Karn Cassis, MD;  Location: [facility];  Service: Neurosurgery;  Laterality: N/A;  . VULVAR LESION REMOVAL  01/01/2009    She had a 1-cm area of erythema to the right of the urethral meatus     Current Outpatient Medications on File Prior to Visit  Medication Sig Dispense Refill  . aspirin 81 MG EC tablet Take 1 tablet (81 mg total) by mouth daily. 30 tablet 12  . bismuth subsalicylate (PEPTO BISMOL) 262 MG/15ML suspension Take 30 mLs by mouth every 4 (four) hours as needed for indigestion or diarrhea or loose stools. 360 mL 0  . Calcium Carb-Cholecalciferol (CALCIUM 600 + D PO) Take 1 tablet by mouth daily.     . cholecalciferol (VITAMIN D3) 25 MCG (1000 UNIT) tablet Take 1,000 Units by mouth daily.    Marland Kitchen docusate sodium (COLACE) 100 MG capsule Take 1 capsule (100 mg total) by mouth daily. (Patient taking differently: Take 100 mg by mouth every other day. ) 10 capsule 0  . furosemide (LASIX) 20 MG tablet Take 1 tablet (20 mg total) by mouth once a week. For edema (Patient taking differently: Take 20 mg by mouth daily as needed for fluid or edema. ) 12 tablet 3  . LUMIGAN 0.01 % SOLN  Place 1 drop into both eyes at bedtime.    . Probiotic Product (PROBIOTIC ACIDOPHILUS BEADS) CAPS Take 1 capsule by mouth daily.    Marland Kitchen trimethoprim (TRIMPEX) 100 MG tablet Take 100 mg by mouth 3 (three) times a week.    . polyethylene glycol (MIRALAX / GLYCOLAX) packet Take 17 g by mouth daily as needed for mild constipation. (Patient not taking: Reported on 06/03/2020) 14 each 0  . vitamin B-12 (CYANOCOBALAMIN) 500 MCG tablet Take 1 tablet (500 mcg total) by mouth daily. (Patient not taking: Reported on 06/03/2020) 30 tablet 0   No current facility-administered medications on file prior to visit.     Allergies  Allergen Reactions  . Crestor [Rosuvastatin Calcium] Other (See Comments)    "bones ache all over"     Social History   Occupational History  . Not on file  Tobacco Use  . Smoking status: Former Smoker    Quit date: 03/09/1991    Years since quitting: 29.2  . Smokeless tobacco: Former Clinical biochemist  . Vaping Use:  Never used  Substance and Sexual Activity  . Alcohol use: Yes    Comment: has a glass of wine occasionally  . Drug use: No  . Sexual activity: Not on file     Family History  Problem Relation Age of Onset  . Hyperlipidemia Mother   . Breast cancer Neg Hx      Immunization History  Administered Date(s) Administered  . Tdap 11/05/2018     Objective: [name] Weber is a/an 87 y.o. female WD, WN in NAD.Marland Kitchen AAO x 3. Vitals:   06/07/20 1552  Temp: (!) 97 F (36.1 C)    Vascular Examination:  Capillary fill time to digits <3 seconds b/l lower extremities. Faintly palpable DP pulse(s) b/l lower extremities. Nonpalpable PT pulse(s) b/l lower extremities. Pedal hair absent. Lower extremity skin temperature gradient within normal limits. No pain with calf compression b/l. No ischemia or gangrene noted b/l lower extremities.  Dermatological Examination: Pedal skin is thin shiny, atrophic b/l lower extremities. No open wounds bilaterally. No interdigital macerations bilaterally. Toenails 1-5 b/l elongated, discolored, dystrophic, thickened, crumbly with subungual debris and tenderness to dorsal palpation. Hyperkeratotic lesion(s) L 3rd toe and submet head 1 right foot.  No erythema, no edema, no drainage, no flocculence. Porokeratotic lesion(s) submet head 2 left foot. No erythema, no edema, no drainage, no flocculence.  Musculoskeletal: Normal muscle strength 5/5 to all lower extremity muscle groups bilaterally. No pain crepitus or joint limitation noted with ROM b/l. Hallux valgus with bunion deformity noted b/l lower extremities. Hammertoes noted to the L 2nd toe and L 3rd toe.  Neurological: Protective sensation intact 5/5 intact bilaterally with 10g monofilament b/l. Vibratory sensation intact b/l. Proprioception intact bilaterally. Deep tendon reflexes normal b/l.  Clonus negative b/l.  Assessment: No diagnosis found.   Plan: -Examined patient. -Discussed topical, laser and oral  medication. Patient opted for topical treatment with compounded medication. Rx written for nonformulary compounding topical antifungal: [facility]: Antifungal cream - Terbinafine 3%, Fluconazole 2%, Tea Tree Oil 5%, Urea 10%, Ibuprofen 2% in DMSO Suspension #65ml. Apply to the affected nail(s) at bedtime. -Toenails 1-5 b/l were debrided in length and girth with sterile nail nippers and dremel without iatrogenic bleeding.  -Corn(s) L 3rd toe and callus(es) submet head 1 right foot were pared utilizing sterile scalpel blade without incident. Total number debrided =2. -Dispensed silicone digital toe cap for left 3rd digit. Apply every morning, remove every  evening.  -Painful porokeratotic lesion(s) submet head 2 left foot pared and enucleated with sterile scalpel blade without incident. -Patient to report any pedal injuries to medical professional immediately. -Patient to continue soft, supportive shoe gear daily. Recommended [employer] for multiple digital deformities. -Patient/POA to call should there be question/concern in the interim.  Return in about 3 months (around 09/07/2020).

## 2020-06-13 ENCOUNTER — Encounter: Payer: Self-pay | Admitting: Podiatry

## 2020-06-13 MED ORDER — NONFORMULARY OR COMPOUNDED ITEM
3 refills | Status: DC
Start: 2020-06-07 — End: 2023-04-09

## 2020-08-13 ENCOUNTER — Ambulatory Visit: Payer: Medicare Other | Admitting: Neurology

## 2020-08-22 ENCOUNTER — Other Ambulatory Visit: Payer: Self-pay

## 2020-08-22 ENCOUNTER — Ambulatory Visit: Payer: Medicare Other | Admitting: Neurology

## 2020-08-22 ENCOUNTER — Encounter: Payer: Self-pay | Admitting: Neurology

## 2020-08-22 VITALS — BP 122/84 | HR 84 | Ht 68.5 in | Wt 107.0 lb

## 2020-08-22 DIAGNOSIS — F0390 Unspecified dementia without behavioral disturbance: Secondary | ICD-10-CM | POA: Diagnosis not present
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
## 2020-08-22 NOTE — Patient Instructions (Signed)
It was nice to meet you today.  You have had memory loss for the past few years.  I would like to proceed with additional testing in the form of brain MRI without contrast as well as some laboratory tests today.  We will call you to schedule your brain scan.  Please try to maintain good nutrition and try to hydrate well with water, about 6 to 8 cups of water per day are recommended generally speaking.    Please use your walker at all times.  You have fallen multiple times.  Please discuss with your family, your POA and your primary care physician options for long-term living situations for you.    We will call you with your test results and consider medication such as Aricept for dementia.  There are several different medications available for memory loss.

## 2020-08-26 ENCOUNTER — Telehealth: Payer: Self-pay | Admitting: Neurology

## 2020-08-26 NOTE — Telephone Encounter (Signed)
[employer] medicare order sent to [facility]. No auth they will reach out to the patient to schedule.  

## 2020-08-29 LAB — COMPREHENSIVE METABOLIC PANEL
ALT: 10 IU/L (ref 0–32)
AST: 19 IU/L (ref 0–40)
Albumin/Globulin Ratio: 1.3 (ref 1.2–2.2)
Albumin: 4.3 g/dL (ref 3.6–4.6)
Alkaline Phosphatase: 85 IU/L (ref 44–121)
BUN/Creatinine Ratio: 29 — ABNORMAL HIGH (ref 12–28)
BUN: 21 mg/dL (ref 8–27)
Bilirubin Total: 0.5 mg/dL (ref 0.0–1.2)
CO2: 25 mmol/L (ref 20–29)
Calcium: 9.7 mg/dL (ref 8.7–10.3)
Chloride: 101 mmol/L (ref 96–106)
Creatinine, Ser: 0.72 mg/dL (ref 0.57–1.00)
GFR calc Af Amer: 88 mL/min/{1.73_m2} (ref >59)
GFR calc non Af Amer: 76 mL/min/{1.73_m2} (ref >59)
Globulin, Total: 3.2 g/dL (ref 1.5–4.5)
Glucose: 109 mg/dL — ABNORMAL HIGH (ref 65–99)
Potassium: 4.4 mmol/L (ref 3.5–5.2)
Sodium: 141 mmol/L (ref 134–144)
Total Protein: 7.5 g/dL (ref 6.0–8.5)

## 2020-08-29 LAB — B12 AND FOLATE PANEL
Folate: 18.2 ng/mL (ref >3.0)
Vitamin B-12: 756 pg/mL (ref 232–1245)

## 2020-08-29 LAB — TSH: TSH: 2.85 u[IU]/mL (ref 0.450–4.500)

## 2020-08-29 LAB — RPR: RPR Ser Ql: NONREACTIVE

## 2020-08-29 LAB — VITAMIN B6: Vitamin B6: 49 ug/L — ABNORMAL HIGH (ref 2.0–32.8)

## 2020-08-29 LAB — VITAMIN B1: Thiamine: 143.8 nmol/L (ref 66.5–200.0)

## 2020-08-29 LAB — HGB A1C W/O EAG: Hgb A1c MFr Bld: 5.6 % (ref 4.8–5.6)

## 2020-08-29 NOTE — Progress Notes (Signed)
Please advise patient or her caretaker that her labs were okay with the exception of increased vitamin B6.  I would recommend, if she is taking a supplement with vitamin B6 that she stop taking additional vitamin B6 as too much of a vitamin can also cause adverse effects.

## 2020-09-02 ENCOUNTER — Telehealth: Payer: Self-pay | Admitting: *Deleted

## 2020-09-02 NOTE — Telephone Encounter (Signed)
I spoke to her caregiver/POA, Ardeth Sportsman (on DPR). She verbalized understanding of the lab findings. She did confirm that the patient takes a multivitamin with B6 and she will stop this supplement.

## 2020-09-02 NOTE — Telephone Encounter (Signed)
-----   Message from Huston Foley, MD sent at 08/29/2020  5:07 PM EDT ----- Please advise patient or her caretaker that her labs were okay with the exception of increased vitamin B6.  I would recommend, if she is taking a supplement with vitamin B6 that she stop taking additional vitamin B6 as too much of a vitamin can also cause adverse effects.

## 2020-09-08 ENCOUNTER — Ambulatory Visit
Admission: RE | Admit: 2020-09-08 | Discharge: 2020-09-08 | Disposition: A | Discharge status: Home / Self Care | Payer: Medicare Other | Source: Ambulatory Visit | Attending: Neurology | Admitting: Neurology

## 2020-09-08 ENCOUNTER — Encounter: Payer: Self-pay | Admitting: Neurology

## 2020-09-08 DIAGNOSIS — F0390 Unspecified dementia without behavioral disturbance: Secondary | ICD-10-CM

## 2020-09-09 ENCOUNTER — Telehealth: Payer: Self-pay | Admitting: Neurology

## 2020-09-09 NOTE — Telephone Encounter (Signed)
When you get a chance can you put a new MRI order in for [facility]. Thank you!!

## 2020-09-09 NOTE — Telephone Encounter (Signed)
No [facility] didn't say anything to me. I only saw this on the cancellation appointment. "pt with ivc filter, unsafe"

## 2020-09-09 NOTE — Telephone Encounter (Signed)
I have reached out to [place] about these questions.

## 2020-09-09 NOTE — Telephone Encounter (Signed)
Please call patient's caretaker for further clarification of her MyChart message.  Is the patient not able to have an MRI done at all?  What kind of implant that she have and can they give [place] the name and ID number, she may have a card for it?  Please let me know.

## 2020-09-10 ENCOUNTER — Telehealth: Payer: Self-pay | Admitting: Neurology

## 2020-09-10 NOTE — Telephone Encounter (Signed)
MRI brain reordered to be done at [facility].  I made a comment regarding her IVC filter placement from 2015.

## 2020-09-10 NOTE — Addendum Note (Signed)
Addended by: Huston Foley on: 09/10/2020 09:24 AM   Modules accepted: Orders

## 2020-09-10 NOTE — Telephone Encounter (Signed)
Noted, faxed information to [facility]. If it is MRI safe they will reach out to the patient to schedule.

## 2020-09-11 ENCOUNTER — Encounter: Payer: Self-pay | Admitting: Podiatry

## 2020-09-11 ENCOUNTER — Other Ambulatory Visit: Payer: Self-pay

## 2020-09-11 ENCOUNTER — Ambulatory Visit: Payer: Medicare Other | Admitting: Podiatry

## 2020-09-11 DIAGNOSIS — M79674 Pain in right toe(s): Secondary | ICD-10-CM

## 2020-09-11 DIAGNOSIS — Q828 Other specified congenital malformations of skin: Secondary | ICD-10-CM | POA: Diagnosis not present

## 2020-09-11 DIAGNOSIS — M79675 Pain in left toe(s): Secondary | ICD-10-CM | POA: Diagnosis not present

## 2020-09-11 DIAGNOSIS — I739 Peripheral vascular disease, unspecified: Secondary | ICD-10-CM | POA: Diagnosis not present

## 2020-09-11 DIAGNOSIS — B351 Tinea unguium: Secondary | ICD-10-CM

## 2020-09-11 DIAGNOSIS — L84 Corns and callosities: Secondary | ICD-10-CM

## 2020-09-15 NOTE — Progress Notes (Signed)
Subjective:  Patient ID: [name] Weber, female    DOB: 1931/03/27,  MRN: [id number]  86 y.o. female presents with for at risk foot care. Patient has h/o PAD, painful corn(s) left 3rd toe , callus(es) right foot and painful mycotic nails.  Pain interferes with ambulation. Aggravating factors include wearing enclosed shoe gear. Painful toenails interfere with ambulation. Aggravating factors include wearing enclosed shoe gear. Pain is relieved with periodic professional debridement. Painful corns and calluses are aggravated when weightbearing with and without shoegear. Pain is relieved with periodic professional debridement. She also has painful porokeratotic lesions left foot.  Pain prevent comfortable ambulation. Aggravating factor is weightbearing with or without shoegear.    Review of Systems: Negative except as noted in the HPI.  Past Medical History:  Diagnosis Date  . A-fib (HCC)   . Abdominal pain   . Acute pulmonary embolism (HCC)   . Age-related osteoporosis with current pathological fracture   . Anxiety state 12/10/2014  . Atrial flutter (HCC) 03/10/2011  . B12 deficiency 11/08/2018  . Bacterial UTI 11/30/2014  . Benign essential HTN   . Benign hypertensive heart disease without heart failure 03/10/2011  . Bilateral foot-drop   . Bilateral pleural effusion 11/15/2018  . Chronic constipation   . Chronic diastolic heart failure (HCC) 05/23/2019  . Chronic midline low back pain without sciatica   . Closed nondisplaced fracture of pelvis with routine healing   . Compression fracture of L2 (HCC) 05/30/2016  . Constipation due to pain medication   . Cystitis 11/05/2018  . Depression   . Elevated blood pressure 05/30/2016  . Elevated brain natriuretic peptide (BNP) level 11/15/2018  . Encounter for therapeutic drug monitoring 12/25/2013  . Fatigue   . Generalized anxiety disorder   . HLD (hyperlipidemia) 03/10/2011  . Hypoalbuminemia due to protein-calorie malnutrition (HCC)   . L2  vertebral fracture (HCC) 05/30/2016  . Leukocytosis 11/12/2018  . Lumbar burst fracture (HCC) 06/08/2016  . Lumbar compression fracture (HCC) 06/08/2016  . Malaise   . Malaise and fatigue 06/01/2011  . Malnutrition of moderate degree 11/15/2018  . Memory loss   . Midline thoracic back pain   . Multiple closed fractures of pelvis with stable disruption of pelvic circle (HCC)   . Multiple rib fractures 11/05/2018  . Myalgia   . Myelopathy (HCC) 01/06/2017  . Near syncope 11/21/2014  . Neurogenic bladder 11/30/2014  . Neurogenic bowel 11/30/2014  . Nocturnal leg cramps 07/16/2014  . Orthostatic hypotension 11/11/2018  . Osteoporosis with current pathological fracture   . Other osteoporosis with current pathological fracture   . PAC (premature atrial contraction)    ISOLATED  . Pain   . Palpitations    OCCASSIONAL  . Paroxysmal atrial flutter (HCC)   . Pelvic fracture (HCC) 12/26/2016  . Pelvic ring fracture (HCC)   . Persistent atrial fibrillation (HCC) 11/27/2014  . Pulmonary hypertension (HCC)   . PVC's (premature ventricular contractions)    ISOLATED  . Rectal abnormality 11/11/2018  . Rib fracture 11/10/2018  . Skin tear of lower leg without complication, left, initial encounter   . Skin tear of lower leg without complication, right, initial encounter   . Slow transit constipation   . Thoracic myelopathy 11/29/2014  . Thrombocytopenia (HCC)   . Thrombus   . Traumatic spinal subdural hematoma   . Weakness of both legs    Past Surgical History:  Procedure Laterality Date  . THORACIC LAMINECTOMY FOR EPIDURAL ABSCESS N/A 11/16/2014   Procedure: THORACIC LAMINECTOMY FOR  EPIDURAL ABSCESS;  Surgeon: Karn Cassis, MD;  Location: [facility];  Service: Neurosurgery;  Laterality: N/A;  . VULVAR LESION REMOVAL  01/01/2009   She had a 1-cm area of erythema to the right of the urethral meatus   Patient Active Problem List   Diagnosis Date Noted  . Edema 05/23/2019  . Chronic diastolic  heart failure (HCC) 81/82/9937  . Malnutrition of moderate degree 11/15/2018  . Bilateral pleural effusion 11/15/2018  . Elevated brain natriuretic peptide (BNP) level 11/15/2018  . Cardiomegaly 11/15/2018  . Leukocytosis 11/12/2018  . Abdominal pain   . Skin tear of lower leg without complication, left, initial encounter   . Skin tear of lower leg without complication, right, initial encounter   . Orthostatic hypotension 11/11/2018  . Rectal abnormality 11/11/2018  . Rib fracture 11/10/2018  . B12 deficiency 11/08/2018  . Multiple rib fractures 11/05/2018  . Cystitis 11/05/2018  . Other osteoporosis with current pathological fracture   . Chronic midline low back pain without sciatica   . Thrombus   . Closed nondisplaced fracture of pelvis with routine healing   . Pelvic ring fracture (HCC)   . Osteoporosis with current pathological fracture   . Slow transit constipation   . Fractures   . Pain   . Generalized anxiety disorder   . Benign essential HTN   . Hypoalbuminemia due to protein-calorie malnutrition (HCC)   . Thrombocytopenia (HCC)   . Pulmonary hypertension (HCC)   . Age-related osteoporosis with current pathological fracture   . Pelvic fracture (HCC) 12/26/2016  . Multiple closed fractures of pelvis with stable disruption of pelvic circle (HCC)   . Memory loss   . Lumbar burst fracture (HCC) 06/08/2016  . Lumbar compression fracture (HCC) 06/08/2016  . Bilateral foot-drop   . Midline thoracic back pain   . A-fib (HCC)   . Depression   . Constipation due to pain medication   . Chronic constipation   . Compression fracture of L2 (HCC) 05/30/2016  . Elevated blood pressure 05/30/2016  . L2 vertebral fracture (HCC) 05/30/2016  . Localized edema 02/11/2015  . Anxiety state 12/10/2014  . Neurogenic bladder 11/30/2014  . Neurogenic bowel 11/30/2014  . Thoracic myelopathy 11/29/2014  . Persistent atrial fibrillation (HCC) 11/27/2014  . Traumatic spinal subdural  hematoma   . Near syncope 11/21/2014  . Spinal paraplegia (HCC) 11/17/2014  . Weakness of both legs   . Nocturnal leg cramps 07/16/2014  . Encounter for therapeutic drug monitoring 12/25/2013  . Malaise and fatigue 06/01/2011  . Atrial flutter (HCC) 03/10/2011  . Benign hypertensive heart disease without heart failure 03/10/2011  . HLD (hyperlipidemia) 03/10/2011  . Paroxysmal atrial flutter (HCC)   . Palpitations   . PAC (premature atrial contraction)   . PVC's (premature ventricular contractions)   . Malaise   . Fatigue   . Myalgia     Current Outpatient Medications:  .  aspirin 81 MG EC tablet, Take 1 tablet (81 mg total) by mouth daily., Disp: 30 tablet, Rfl: 12 .  Calcium Carb-Cholecalciferol (CALCIUM 600 + D PO), Take 1 tablet by mouth daily. , Disp: , Rfl:  .  cholecalciferol (VITAMIN D3) 25 MCG (1000 UNIT) tablet, Take 1,000 Units by mouth daily., Disp: , Rfl:  .  docusate sodium (COLACE) 100 MG capsule, Take 1 capsule (100 mg total) by mouth daily. (Patient taking differently: Take 100 mg by mouth every other day. ), Disp: 10 capsule, Rfl: 0 .  furosemide (LASIX) 20 MG  tablet, Take 1 tablet (20 mg total) by mouth once a week. For edema (Patient taking differently: Take 20 mg by mouth daily as needed for fluid or edema. ), Disp: 12 tablet, Rfl: 3 .  LUMIGAN 0.01 % SOLN, Place 1 drop into both eyes at bedtime., Disp: , Rfl:  .  nitrofurantoin, macrocrystal-monohydrate, (MACROBID) 100 MG capsule, Take 100 mg by mouth 2 (two) times daily., Disp: , Rfl:  .  NONFORMULARY OR COMPOUNDED ITEM, Antifungal solution: Terbinafine 3%, Fluconazole 2%, Tea Tree Oil 5%, Urea 10%, Ibuprofen 2% in DMSO suspension #7930mL, Disp: 1 each, Rfl: 3 .  polyethylene glycol (MIRALAX / GLYCOLAX) packet, Take 17 g by mouth daily as needed for mild constipation., Disp: 14 each, Rfl: 0 .  Probiotic Product (PROBIOTIC ACIDOPHILUS BEADS) CAPS, Take 1 capsule by mouth daily., Disp: , Rfl:  .  trimethoprim  (TRIMPEX) 100 MG tablet, Take 100 mg by mouth 3 (three) times a week., Disp: , Rfl:  .  vitamin B-12 (CYANOCOBALAMIN) 500 MCG tablet, Take 1 tablet (500 mcg total) by mouth daily., Disp: 30 tablet, Rfl: 0 Allergies  Allergen Reactions  . Crestor [Rosuvastatin Calcium] Other (See Comments)    "bones ache all over"   Social History   Occupational History  . Not on file  Tobacco Use  . Smoking status: Former Smoker    Quit date: 03/09/1991    Years since quitting: 29.5  . Smokeless tobacco: Former Clinical biochemistUser  Vaping Use  . Vaping Use: Never used  Substance and Sexual Activity  . Alcohol use: Yes    Comment: has a glass of wine occasionally  . Drug use: No  . Sexual activity: Not on file    Objective:   Constitutional Pt is a pleasant 86 y.o. Caucasian female WD, WN in NAD. AAO x 3.   Vascular Capillary fill time to digits <3 seconds b/l lower extremities. Faintly palpable DP pulse(s) b/l lower extremities. Nonpalpable PT pulse(s) b/l lower extremities. Pedal hair absent. Lower extremity skin temperature gradient within normal limits. No pain with calf compression b/l. No ischemia or gangrene noted b/l lower extremities. No cyanosis or clubbing noted.  Neurologic Normal speech. Protective sensation intact 5/5 intact bilaterally with 10g monofilament b/l. Vibratory sensation intact b/l.  Dermatologic Pedal skin is thin shiny, atrophic b/l lower extremities. No open wounds bilaterally. No interdigital macerations bilaterally. Toenails 1-5 b/l elongated, discolored, dystrophic, thickened, crumbly with subungual debris and tenderness to dorsal palpation. Hyperkeratotic lesion(s) L 3rd toe and submet head 1 right foot.  No erythema, no edema, no drainage, no flocculence. Porokeratotic lesion(s) submet head 2 left foot. No erythema, no edema, no drainage, no flocculence.  Orthopedic: Normal muscle strength 5/5 to all lower extremity muscle groups bilaterally. No pain crepitus or joint limitation noted  with ROM b/l. Hallux valgus with bunion deformity noted b/l lower extremities. Hammertoes noted to the L 2nd toe and L 3rd toe.   Radiographs: None Assessment:   1. Pain due to onychomycosis of toenails of both feet   2. Corns and callosities   3. Porokeratosis   4. PAD (peripheral artery disease) (HCC)    Plan:  Patient was evaluated and treated and all questions answered.  Onychomycosis with pain -Nails palliatively debridement as below. -Educated on self-care  Procedure: Nail Debridement Rationale: Pain Type of Debridement: manual, sharp debridement. Instrumentation: Nail nipper, rotary burr. Number of Nails: 10  -Examined patient. -No new findings. No new orders. -Toenails 1-5 b/l were debrided in length and girth  with sterile nail nippers and dremel without iatrogenic bleeding.  -Corn(s) L 3rd toe and callus(es) submet head 1 right foot were pared utilizing sterile scalpel blade without incident. Total number debrided =2. -Painful porokeratotic lesion(s) submet head 2 left foot pared and enucleated with sterile scalpel blade without incident. -Patient to report any pedal injuries to medical professional immediately. -Patient to continue soft, supportive shoe gear daily. -Patient/POA to call should there be question/concern in the interim.  Return in about 3 months (around 12/12/2020).  Freddie Breech, DPM

## 2020-09-16 NOTE — Telephone Encounter (Signed)
Patient is scheduled at [facility] for 09/21/20.

## 2020-09-21 ENCOUNTER — Other Ambulatory Visit: Payer: Self-pay

## 2020-09-21 ENCOUNTER — Ambulatory Visit (HOSPITAL_COMMUNITY)
Admission: RE | Admit: 2020-09-21 | Discharge: 2020-09-21 | Disposition: A | Discharge status: Home / Self Care | Payer: Medicare Other | Source: Ambulatory Visit | Attending: Neurology | Admitting: Neurology

## 2020-09-21 DIAGNOSIS — F0390 Unspecified dementia without behavioral disturbance: Secondary | ICD-10-CM | POA: Diagnosis present

## 2020-09-23 ENCOUNTER — Telehealth: Payer: Self-pay

## 2020-09-23 NOTE — Telephone Encounter (Signed)
-----   Message from Asa Lente, MD sent at 09/23/2020  8:38 AM EDT ----- Please let her or her family know that the MRI does show more atrophy than typical for age that could be associated with more cognitive issues.  There are also some other mild age-related findings.

## 2020-09-23 NOTE — Telephone Encounter (Signed)
I called pt, spoke with Vi Appleton, per DPR. I discussed pt's  MRI results. Pt will keep the appt as scheduled for 10/23/2020. Vi verbalized understanding of results.

## 2020-10-07 ENCOUNTER — Telehealth: Payer: Self-pay | Admitting: Neurology

## 2020-10-07 NOTE — Telephone Encounter (Signed)
I called pt's daughter, Viola/POA, per DPR and discussed this with her. Pt's second daughter will use a speaker phone during the appt.

## 2020-10-07 NOTE — Telephone Encounter (Signed)
Patient can bring one daughter and we can have the other daughter on speaker phone for the appt.

## 2020-10-07 NOTE — Telephone Encounter (Signed)
I called pt's daughter/POA, Ardeth Sportsman, per [facility]. She would like both her and her sister Lucendia Herrlich, to come back for pt's appt. I reminded her of the one visitor policy in effect right now because of COVID. Pt's daughter reports that it is critical that both daughters be allowed to attend this appt. She is wondering if Dr. Frances Furbish would make an exception or be willing to allow the other daughter to be on speaker phone/conference call during the visit.

## 2020-10-07 NOTE — Telephone Encounter (Signed)
[name] Weber is asking for a call from RN to discuss being able to come back and speak with Dr Frances Furbish on pt's upcoming appointment, please call.

## 2020-10-23 ENCOUNTER — Encounter: Payer: Self-pay | Admitting: Neurology

## 2020-10-23 ENCOUNTER — Ambulatory Visit: Payer: Medicare Other | Admitting: Neurology

## 2020-10-23 VITALS — BP 123/73 | HR 100 | Ht 68.5 in | Wt 105.0 lb

## 2020-10-23 DIAGNOSIS — F0390 Unspecified dementia without behavioral disturbance: Secondary | ICD-10-CM | POA: Diagnosis not present

## 2020-10-23 NOTE — Patient Instructions (Addendum)
It was good to see you both again today.  As discussed, we can consider medication for dementia such as donepezil which is generic for Aricept.  We could do a low dose, 5 mg strength and see if you tolerate the medication.  Side effects may include dry eyes, dry mouth, confusion, low pulse, low blood pressure, also GI related side effects (nausea, vomiting, diarrhea, constipation), headaches; rare side effects may include hallucinations and seizures.   Please let me know if you change your mind and would like to start medication.  I would recommend 24/7 supervision either at the house or in the form of assisted living memory care facility if possible.  Please talk to your primary care physician and have close follow-up with Dr. Kevan Ny about your appetite loss and weight loss.  He may want to do additional testing for this.  Please also discuss referral to palliative care as you mentioned it with Dr. Kevan Ny.    At this juncture, I will see you back as needed.  You can always call to make a follow-up appointment.  You can also call if you would like to start medication for dementia.  For now, I would like for you to follow-up with your primary care physician.
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
## 2020-12-03 DIAGNOSIS — H401111 Primary open-angle glaucoma, right eye, mild stage: Secondary | ICD-10-CM | POA: Diagnosis not present

## 2020-12-18 ENCOUNTER — Ambulatory Visit: Payer: Medicare Other | Admitting: Podiatry

## 2021-03-03 ENCOUNTER — Encounter: Payer: Self-pay | Admitting: Podiatry

## 2021-03-03 ENCOUNTER — Ambulatory Visit: Payer: Medicare Other | Admitting: Podiatry

## 2021-03-03 ENCOUNTER — Other Ambulatory Visit: Payer: Self-pay

## 2021-03-03 DIAGNOSIS — R351 Nocturia: Secondary | ICD-10-CM | POA: Insufficient documentation

## 2021-03-03 DIAGNOSIS — I739 Peripheral vascular disease, unspecified: Secondary | ICD-10-CM | POA: Diagnosis not present

## 2021-03-03 DIAGNOSIS — R410 Disorientation, unspecified: Secondary | ICD-10-CM | POA: Insufficient documentation

## 2021-03-03 DIAGNOSIS — B351 Tinea unguium: Secondary | ICD-10-CM

## 2021-03-03 DIAGNOSIS — M21969 Unspecified acquired deformity of unspecified lower leg: Secondary | ICD-10-CM | POA: Insufficient documentation

## 2021-03-03 DIAGNOSIS — Z87828 Personal history of other (healed) physical injury and trauma: Secondary | ICD-10-CM | POA: Insufficient documentation

## 2021-03-03 DIAGNOSIS — M79672 Pain in left foot: Secondary | ICD-10-CM | POA: Insufficient documentation

## 2021-03-03 DIAGNOSIS — Q828 Other specified congenital malformations of skin: Secondary | ICD-10-CM | POA: Diagnosis not present

## 2021-03-03 DIAGNOSIS — S81802A Unspecified open wound, left lower leg, initial encounter: Secondary | ICD-10-CM | POA: Insufficient documentation

## 2021-03-03 DIAGNOSIS — M79675 Pain in left toe(s): Secondary | ICD-10-CM

## 2021-03-03 DIAGNOSIS — R112 Nausea with vomiting, unspecified: Secondary | ICD-10-CM | POA: Insufficient documentation

## 2021-03-03 DIAGNOSIS — M79674 Pain in right toe(s): Secondary | ICD-10-CM

## 2021-03-03 DIAGNOSIS — M2010 Hallux valgus (acquired), unspecified foot: Secondary | ICD-10-CM | POA: Insufficient documentation

## 2021-03-03 DIAGNOSIS — G9519 Other vascular myelopathies: Secondary | ICD-10-CM | POA: Insufficient documentation

## 2021-03-03 DIAGNOSIS — L820 Inflamed seborrheic keratosis: Secondary | ICD-10-CM | POA: Insufficient documentation

## 2021-03-03 DIAGNOSIS — M79609 Pain in unspecified limb: Secondary | ICD-10-CM | POA: Insufficient documentation

## 2021-03-03 DIAGNOSIS — R630 Anorexia: Secondary | ICD-10-CM | POA: Insufficient documentation

## 2021-03-03 DIAGNOSIS — M25551 Pain in right hip: Secondary | ICD-10-CM | POA: Insufficient documentation

## 2021-03-03 DIAGNOSIS — I110 Hypertensive heart disease with heart failure: Secondary | ICD-10-CM | POA: Insufficient documentation

## 2021-03-03 DIAGNOSIS — K219 Gastro-esophageal reflux disease without esophagitis: Secondary | ICD-10-CM | POA: Insufficient documentation

## 2021-03-03 DIAGNOSIS — L84 Corns and callosities: Secondary | ICD-10-CM

## 2021-03-03 DIAGNOSIS — Z79899 Other long term (current) drug therapy: Secondary | ICD-10-CM | POA: Insufficient documentation

## 2021-03-03 DIAGNOSIS — H6120 Impacted cerumen, unspecified ear: Secondary | ICD-10-CM | POA: Insufficient documentation

## 2021-03-03 DIAGNOSIS — R634 Abnormal weight loss: Secondary | ICD-10-CM | POA: Insufficient documentation

## 2021-03-03 DIAGNOSIS — R32 Unspecified urinary incontinence: Secondary | ICD-10-CM | POA: Insufficient documentation

## 2021-03-03 DIAGNOSIS — R4589 Other symptoms and signs involving emotional state: Secondary | ICD-10-CM | POA: Insufficient documentation

## 2021-03-03 DIAGNOSIS — J309 Allergic rhinitis, unspecified: Secondary | ICD-10-CM | POA: Insufficient documentation

## 2021-03-03 DIAGNOSIS — L989 Disorder of the skin and subcutaneous tissue, unspecified: Secondary | ICD-10-CM | POA: Insufficient documentation

## 2021-03-03 DIAGNOSIS — I809 Phlebitis and thrombophlebitis of unspecified site: Secondary | ICD-10-CM | POA: Insufficient documentation

## 2021-03-09 NOTE — Progress Notes (Signed)
  Subjective:  Patient ID: [name] Weber, female    DOB: Oct 07, 1931,  MRN: [id number]  87 y.o. female presents with for at risk foot care. Patient has h/o PAD, painful corn(s) left 3rd toe , callus(es) right foot and painful mycotic nails.  Pain interferes with ambulation. Aggravating factors include wearing enclosed shoe gear. Painful toenails interfere with ambulation. Aggravating factors include wearing enclosed shoe gear. Pain is relieved with periodic professional debridement. Painful corns and calluses are aggravated when weightbearing with and without shoegear. Pain is relieved with periodic professional debridement. She also has painful porokeratotic lesions left foot.  Pain prevent comfortable ambulation. Aggravating factor is weightbearing with or without shoegear.    PCP is Dr. Marden Noble. Last visit was six months ago.   Review of Systems: Negative except as noted in the HPI.   Allergies  Allergen Reactions  . Crestor [Rosuvastatin Calcium] Other (See Comments)    "bones ache all over"  . Rosuvastatin Other (See Comments)     Bone pain all over body Other reaction(s): body aches  . Ciprofloxacin     Other reaction(s): vomiting   Objective:   Constitutional Pt is a pleasant 87 y.o. Caucasian female WD, WN in NAD. AAO x 3.   Vascular Capillary fill time to digits <3 seconds b/l lower extremities. Faintly palpable DP pulse(s) b/l lower extremities. Nonpalpable PT pulse(s) b/l lower extremities. Pedal hair absent. Lower extremity skin temperature gradient within normal limits. No pain with calf compression b/l. No ischemia or gangrene noted b/l lower extremities. No cyanosis or clubbing noted.  Neurologic Normal speech. Protective sensation intact 5/5 intact bilaterally with 10g monofilament b/l. Vibratory sensation intact b/l.  Dermatologic Pedal skin is thin shiny, atrophic b/l lower extremities. No open wounds bilaterally. No interdigital macerations bilaterally. Toenails 1-5  b/l elongated, discolored, dystrophic, thickened, crumbly with subungual debris and tenderness to dorsal palpation. Hyperkeratotic lesion(s) L 3rd toe and submet head 1 right foot.  No erythema, no edema, no drainage, no flocculence. Porokeratotic lesion(s) submet head 2 left foot. No erythema, no edema, no drainage, no flocculence.  Orthopedic: Normal muscle strength 5/5 to all lower extremity muscle groups bilaterally. No pain crepitus or joint limitation noted with ROM b/l. Hallux valgus with bunion deformity noted b/l lower extremities. Hammertoes noted to the L 2nd toe and L 3rd toe.   Radiographs: None Assessment:   1. Pain due to onychomycosis of toenails of both feet   2. Corns and callosities   3. Porokeratosis   4. PAD (peripheral artery disease) (HCC)    Plan:  Patient was evaluated and treated and all questions answered.  Onychomycosis with pain -Nails palliatively debridement as below. -Educated on self-care  Procedure: Nail Debridement Rationale: Pain Type of Debridement: manual, sharp debridement. Instrumentation: Nail nipper, rotary burr. Number of Nails: 10  -Examined patient. -No new findings. No new orders. -Toenails 1-5 b/l were debrided in length and girth with sterile nail nippers and dremel without iatrogenic bleeding.  -Corn(s) L 3rd toe and callus(es) submet head 1 right foot were pared utilizing sterile scalpel blade without incident. Total number debrided =2. -Painful porokeratotic lesion(s) submet head 2 left foot pared and enucleated with sterile scalpel blade without incident. -Patient to report any pedal injuries to medical professional immediately. -Patient to continue soft, supportive shoe gear daily. -Patient/POA to call should there be question/concern in the interim.  Return in about 3 months (around 06/02/2021).  Freddie Breech, DPM

## 2021-04-30 DIAGNOSIS — R531 Weakness: Secondary | ICD-10-CM | POA: Diagnosis not present

## 2021-04-30 DIAGNOSIS — Z79899 Other long term (current) drug therapy: Secondary | ICD-10-CM | POA: Diagnosis not present

## 2021-04-30 DIAGNOSIS — E43 Unspecified severe protein-calorie malnutrition: Secondary | ICD-10-CM | POA: Diagnosis not present

## 2021-04-30 DIAGNOSIS — I4891 Unspecified atrial fibrillation: Secondary | ICD-10-CM | POA: Diagnosis not present

## 2021-04-30 DIAGNOSIS — E559 Vitamin D deficiency, unspecified: Secondary | ICD-10-CM | POA: Diagnosis not present

## 2021-04-30 DIAGNOSIS — R413 Other amnesia: Secondary | ICD-10-CM | POA: Diagnosis not present

## 2021-04-30 DIAGNOSIS — I110 Hypertensive heart disease with heart failure: Secondary | ICD-10-CM | POA: Diagnosis not present

## 2021-06-11 ENCOUNTER — Encounter: Payer: Self-pay | Admitting: Podiatry

## 2021-06-11 ENCOUNTER — Ambulatory Visit: Payer: Medicare Other | Admitting: Podiatry

## 2021-06-11 ENCOUNTER — Other Ambulatory Visit: Payer: Self-pay

## 2021-06-11 DIAGNOSIS — Q828 Other specified congenital malformations of skin: Secondary | ICD-10-CM | POA: Diagnosis not present

## 2021-06-11 DIAGNOSIS — M79674 Pain in right toe(s): Secondary | ICD-10-CM | POA: Diagnosis not present

## 2021-06-11 DIAGNOSIS — M79675 Pain in left toe(s): Secondary | ICD-10-CM

## 2021-06-11 DIAGNOSIS — B351 Tinea unguium: Secondary | ICD-10-CM | POA: Diagnosis not present

## 2021-06-11 DIAGNOSIS — L84 Corns and callosities: Secondary | ICD-10-CM

## 2021-06-11 DIAGNOSIS — I739 Peripheral vascular disease, unspecified: Secondary | ICD-10-CM | POA: Diagnosis not present

## 2021-06-12 DIAGNOSIS — H401131 Primary open-angle glaucoma, bilateral, mild stage: Secondary | ICD-10-CM | POA: Diagnosis not present

## 2021-06-14 NOTE — Progress Notes (Signed)
Subjective: [name] Weber is a pleasant 87 y.o. female patient seen today with h/o PAD. She has corns, calluses and painful thick toenails that are difficult to trim. Pain interferes with ambulation. Aggravating factors include wearing enclosed shoe gear. Pain is relieved with periodic professional debridement.  PCP is Marden Noble, MD. Last visit was: one month ago.  Allergies  Allergen Reactions   Crestor [Rosuvastatin Calcium] Other (See Comments)    "bones ache all over"   Rosuvastatin Other (See Comments)     Bone pain all over body Other reaction(s): body aches   Ciprofloxacin     Other reaction(s): vomiting    Objective: Physical Exam  General: [name] Weber is a pleasant 87 y.o. Caucasian female, WD, WN in NAD. AAO x 3.   Vascular:  Capillary fill time to digits <3 seconds b/l lower extremities. Faintly palpable DP pulse(s) b/l lower extremities. Nonpalpable PT pulse(s) b/l lower extremities. Pedal hair absent. Lower extremity skin temperature gradient within normal limits.  Dermatological:  Pedal skin is thin shiny, atrophic b/l lower extremities. No interdigital macerations b/l lower extremities. Toenails 1-5 b/l elongated, discolored, dystrophic, thickened, crumbly with subungual debris and tenderness to dorsal palpation. Hyperkeratotic lesion(s) L 3rd toe and submet head 1 right foot.  No erythema, no edema, no drainage, no fluctuance. Porokeratotic lesion(s) submet head 2 left foot. No erythema, no edema, no drainage, no fluctuance.  Musculoskeletal:  Normal muscle strength 5/5 to all lower extremity muscle groups bilaterally. No pain crepitus or joint limitation noted with ROM b/l. Hallux valgus with bunion deformity noted b/l lower extremities. Hammertoe(s) noted to the L 2nd toe and L 3rd toe.  Neurological:  Protective sensation intact 5/5 intact bilaterally with 10g monofilament b/l. Vibratory sensation intact b/l.  Assessment and Plan:  1. Pain due to  onychomycosis of toenails of both feet   2. Corns and callosities   3. Porokeratosis   4. PAD (peripheral artery disease) (HCC)      -Examined patient. -Patient to continue soft, supportive shoe gear daily. -Toenails 1-5 b/l were debrided in length and girth with sterile nail nippers and dremel without iatrogenic bleeding.  -Corn(s) L 3rd toe and callus(es) submet head 1 right foot were pared utilizing sterile scalpel blade without incident. Total number debrided =2. -Painful porokeratotic lesion(s) submet head 2 left foot pared and enucleated with sterile scalpel blade without incident. Total number of lesions debrided=1. -Patient to report any pedal injuries to medical professional immediately. -Patient/POA to call should there be question/concern in the interim.  Return in about 3 months (around 09/11/2021).  Freddie Breech, DPM

## 2021-10-30 DIAGNOSIS — H905 Unspecified sensorineural hearing loss: Secondary | ICD-10-CM | POA: Diagnosis not present

## 2021-12-09 ENCOUNTER — Ambulatory Visit: Payer: Medicare Other | Admitting: Podiatry

## 2021-12-09 ENCOUNTER — Encounter: Payer: Self-pay | Admitting: Podiatry

## 2021-12-09 ENCOUNTER — Other Ambulatory Visit: Payer: Self-pay

## 2021-12-09 DIAGNOSIS — R627 Adult failure to thrive: Secondary | ICD-10-CM | POA: Insufficient documentation

## 2021-12-09 DIAGNOSIS — M79675 Pain in left toe(s): Secondary | ICD-10-CM

## 2021-12-09 DIAGNOSIS — B351 Tinea unguium: Secondary | ICD-10-CM | POA: Diagnosis not present

## 2021-12-09 DIAGNOSIS — Q828 Other specified congenital malformations of skin: Secondary | ICD-10-CM | POA: Diagnosis not present

## 2021-12-09 DIAGNOSIS — I739 Peripheral vascular disease, unspecified: Secondary | ICD-10-CM | POA: Diagnosis not present

## 2021-12-09 DIAGNOSIS — M79674 Pain in right toe(s): Secondary | ICD-10-CM | POA: Diagnosis not present

## 2021-12-09 DIAGNOSIS — L84 Corns and callosities: Secondary | ICD-10-CM

## 2021-12-09 DIAGNOSIS — E559 Vitamin D deficiency, unspecified: Secondary | ICD-10-CM | POA: Insufficient documentation

## 2021-12-09 DIAGNOSIS — R269 Unspecified abnormalities of gait and mobility: Secondary | ICD-10-CM | POA: Insufficient documentation

## 2021-12-14 NOTE — Progress Notes (Signed)
°  Subjective:  Patient ID: [name] Weber, female    DOB: 1931-06-18,  MRN: [id number]  [name] Weber presents to clinic today for for at risk foot care. Patient has h/o PAD, corn(s) left 3rd toe , callus(es) right foot and painful mycotic nails.  Pain interferes with ambulation. Aggravating factors include wearing enclosed shoe gear. Painful toenails interfere with ambulation. Aggravating factors include wearing enclosed shoe gear. Pain is relieved with periodic professional debridement. Painful corns and calluses are aggravated when weightbearing with and without shoegear. Pain is relieved with periodic professional debridement., and painful porokeratotic lesions left foot.  Pain prevent comfortable ambulation. Aggravating factor is weightbearing with or without shoegear.  PCP is Marden Noble, MD , and last visit was 11/12/2021.  Allergies  Allergen Reactions   Crestor [Rosuvastatin Calcium] Other (See Comments)    "bones ache all over"   Rosuvastatin Other (See Comments)     Bone pain all over body Other reaction(s): body aches   Ciprofloxacin     Other reaction(s): vomiting    Review of Systems: Negative except as noted in the HPI. Objective:   Constitutional [name] Weber is a pleasant 88 y.o. Caucasian female, WD, WN in NAD. AAO x 3.   Vascular CFT <3 seconds b/l LE. Faintly palpable DP pulses b/l. Nonpalpable PT pulses b/l. Pedal hair absent b/l. Skin temperature gradient WNL b/l. No pain with calf compression b/l. No edema b/l LE. No cyanosis or clubbing noted b/l LE.  Neurologic Normal speech. Oriented to person, place, and time. Protective sensation intact 5/5 intact bilaterally with 10g monofilament b/l. Vibratory sensation intact b/l.  Dermatologic Pedal skin thin, shiny and atrophic b/l LE. No open wounds b/l. No interdigital macerations. Toenails 1-5 b/l elongated, thickened, discolored with subungual debris. +Tenderness with dorsal palpation of nailplates.  Hyperkeratotic lesion(s) noted L 3rd toe and submet head 1 right foot.  Porokeratotic lesion(s) noted submet head 2 left foot.  Orthopedic: Normal muscle strength 5/5 to all lower extremity muscle groups bilaterally. Hammertoe(s) noted to the L 2nd toe and L 3rd toe.Marland Kitchen No pain, crepitus or joint limitation noted with ROM b/l LE.  Patient ambulates independently without assistive aids.   Radiographs: None  Assessment:   1. Pain due to onychomycosis of toenails of both feet   2. Corns and callosities   3. Porokeratosis   4. PAD (peripheral artery disease) (HCC)    Plan:  Patient was evaluated and treated and all questions answered. Consent given for treatment as described below: -Mycotic toenails 1-5 bilaterally were debrided in length and girth with sterile nail nippers and dremel without incident. -Corn(s) L 3rd toe pared utilizing sterile scalpel blade without complication or incident. Total number debrided=1. -Callus(es) submet head 1 right foot pared utilizing sterile scalpel blade without complication or incident. Total number debrided =1. -Painful porokeratotic lesion(s) submet head 2 left foot pared and enucleated with sterile scalpel blade without incident. Total number of lesions debrided=1. -Patient/POA to call should there be question/concern in the interim.  Return in about 3 months (around 03/09/2022).  Freddie Breech, DPM

## 2022-03-11 ENCOUNTER — Encounter: Payer: Self-pay | Admitting: Podiatry

## 2022-03-11 ENCOUNTER — Ambulatory Visit: Payer: Medicare Other | Admitting: Podiatry

## 2022-03-11 DIAGNOSIS — L84 Corns and callosities: Secondary | ICD-10-CM | POA: Diagnosis not present

## 2022-03-11 DIAGNOSIS — I739 Peripheral vascular disease, unspecified: Secondary | ICD-10-CM | POA: Diagnosis not present

## 2022-03-11 DIAGNOSIS — Q828 Other specified congenital malformations of skin: Secondary | ICD-10-CM | POA: Diagnosis not present

## 2022-03-11 DIAGNOSIS — M79675 Pain in left toe(s): Secondary | ICD-10-CM

## 2022-03-11 DIAGNOSIS — B351 Tinea unguium: Secondary | ICD-10-CM | POA: Diagnosis not present

## 2022-03-11 DIAGNOSIS — M79674 Pain in right toe(s): Secondary | ICD-10-CM

## 2022-03-21 NOTE — Progress Notes (Signed)
?  Subjective:  ?Patient ID: [name] Weber, female    DOB: Aug 30, 1931,  MRN: [id number] ? ?[name] Weber presents to clinic today for for at risk foot care. Patient has h/o PAD and corn(s) left foot and painful thick toenails that are difficult to trim. Painful toenails interfere with ambulation. Aggravating factors include wearing enclosed shoe gear. Pain is relieved with periodic professional debridement. Painful corns are aggravated when weightbearing when wearing enclosed shoe gear. Pain is relieved with periodic professional debridement. ? ?New problem(s): None.  ? ?PCP is Marden Noble, MD , and last visit was February,2023.. ? ?Allergies  ?Allergen Reactions  ? Crestor [Rosuvastatin Calcium] Other (See Comments)  ?  "bones ache all over"  ? Rosuvastatin Other (See Comments)  ?   ?Bone pain all over body ?Other reaction(s): body aches  ? Ciprofloxacin   ?  Other reaction(s): vomiting  ? ? ?Review of Systems: Negative except as noted in the HPI. ? ?Objective: No changes noted in today's physical examination. ? ?Constitutional [name] Weber is a pleasant 88 y.o. Caucasian female, WD, WN in NAD. AAO x 3.   ?Vascular CFT <3 seconds b/l LE. Faintly palpable DP pulses b/l. Nonpalpable PT pulses b/l. Pedal hair absent b/l. Skin temperature gradient WNL b/l. No pain with calf compression b/l. No edema b/l LE. No cyanosis or clubbing noted b/l LE.  ?Neurologic Normal speech. Oriented to person, place, and time. Protective sensation intact 5/5 intact bilaterally with 10g monofilament b/l. Vibratory sensation intact b/l.  ?Dermatologic Pedal skin thin, shiny and atrophic b/l LE. No open wounds b/l. No interdigital macerations. Toenails 1-5 b/l elongated, thickened, discolored with subungual debris. +Tenderness with dorsal palpation of nailplates. Hyperkeratotic lesion(s) noted L 3rd toe and submet head 1 right foot.  Porokeratotic lesion(s) noted submet head 2 left foot and plantar heel left foot .   ?Orthopedic: Normal muscle strength 5/5 to all lower extremity muscle groups bilaterally. Hammertoe(s) noted to the L 2nd toe and L 3rd toe.Marland Kitchen No pain, crepitus or joint limitation noted with ROM b/l LE.  Patient ambulates independently without assistive aids.  ? ?Radiographs: None  ? ?Assessment/Plan: ?1. Pain due to onychomycosis of toenails of both feet   ?2. Corns   ?3. Porokeratosis   ?4. PAD (peripheral artery disease) (HCC)   ?  ?-Patient was evaluated and treated. All patient's and/or POA's questions/concerns answered on today's visit. ?-Toenails 1-5 b/l were debrided in length and girth with sterile nail nippers and dremel without iatrogenic bleeding.  ?-Corn(s) plantar heel pad of left foot and L 3rd toe pared utilizing sterile scalpel blade without complication or incident. Total number debrided=1. ?-Painful porokeratotic lesion(s) plantar heel pad of left foot and submet head 2 left foot pared and enucleated with sterile scalpel blade without incident. Total number of lesions debrided=2. ?-Patient/POA to call should there be question/concern in the interim.  ? ?Return in about 3 months (around 06/10/2022). ? ?Freddie Breech, DPM  ?

## 2022-06-10 DIAGNOSIS — M25571 Pain in right ankle and joints of right foot: Secondary | ICD-10-CM | POA: Diagnosis not present

## 2022-06-17 ENCOUNTER — Ambulatory Visit: Payer: Medicare Other | Admitting: Podiatry

## 2022-06-22 DIAGNOSIS — H401131 Primary open-angle glaucoma, bilateral, mild stage: Secondary | ICD-10-CM | POA: Diagnosis not present

## 2022-09-07 ENCOUNTER — Ambulatory Visit: Payer: Medicare Other | Admitting: Podiatry

## 2022-09-07 ENCOUNTER — Encounter: Payer: Self-pay | Admitting: Podiatry

## 2022-09-07 DIAGNOSIS — L84 Corns and callosities: Secondary | ICD-10-CM | POA: Diagnosis not present

## 2022-09-07 DIAGNOSIS — M79674 Pain in right toe(s): Secondary | ICD-10-CM

## 2022-09-07 DIAGNOSIS — B351 Tinea unguium: Secondary | ICD-10-CM

## 2022-09-07 DIAGNOSIS — I739 Peripheral vascular disease, unspecified: Secondary | ICD-10-CM

## 2022-09-07 DIAGNOSIS — Q828 Other specified congenital malformations of skin: Secondary | ICD-10-CM

## 2022-09-07 DIAGNOSIS — M79675 Pain in left toe(s): Secondary | ICD-10-CM

## 2022-09-10 DIAGNOSIS — R5381 Other malaise: Secondary | ICD-10-CM | POA: Diagnosis not present

## 2022-09-10 DIAGNOSIS — L89151 Pressure ulcer of sacral region, stage 1: Secondary | ICD-10-CM | POA: Diagnosis not present

## 2022-09-10 DIAGNOSIS — Z79899 Other long term (current) drug therapy: Secondary | ICD-10-CM | POA: Diagnosis not present

## 2022-09-10 DIAGNOSIS — R110 Nausea: Secondary | ICD-10-CM | POA: Diagnosis not present

## 2022-09-10 DIAGNOSIS — N3000 Acute cystitis without hematuria: Secondary | ICD-10-CM | POA: Diagnosis not present

## 2022-09-10 DIAGNOSIS — Q181 Preauricular sinus and cyst: Secondary | ICD-10-CM | POA: Diagnosis not present

## 2022-09-13 NOTE — Progress Notes (Addendum)
  Subjective:  Patient ID: [name] Weber, female    DOB: [birth year],  MRN: [id number]  [name] Weber presents to clinic today for:  Chief Complaint  Patient presents with   Nail Problem    Routine foot care PCP-Robert [name] PCP [phone]   Daughter states patient had issue with right ankle and saw Ortho. She was given a boot and has not been wearing it.  Patient is on hospice.  Allergies  Allergen Reactions   Crestor [Rosuvastatin Calcium] Other (See Comments)    "bones ache all over"   Rosuvastatin Other (See Comments)     Bone pain all over body Other reaction(s): body aches   Ciprofloxacin     Other reaction(s): vomiting    Review of Systems: Negative except as noted in the HPI.  Objective: No changes noted in today's physical examination.  [name] Weber is a pleasant 88 y.o. female thin build in NAD. AAO x 3. Vascular CFT <3 seconds b/l LE. Faintly palpable DP pulses b/l. Nonpalpable PT pulses b/l. Pedal hair absent b/l. Skin temperature gradient WNL b/l. No pain with calf compression b/l. No edema b/l LE. No cyanosis or clubbing noted b/l LE.  Neurologic Normal speech. Oriented to person, place, and time. Protective sensation intact 5/5 intact bilaterally with 10g monofilament b/l. Vibratory sensation intact b/l.  Dermatologic Pedal skin thin, shiny and atrophic b/l LE. No open wounds b/l. No interdigital macerations. Toenails 1-5 b/l elongated, thickened, discolored with subungual debris. +Tenderness with dorsal palpation of nailplates. Hyperkeratotic lesion(s) noted L 3rd toe and submet head 1 right foot.  Porokeratotic lesion(s) noted submet head 2 left foot and plantar heel left foot .  Orthopedic: Normal muscle strength 5/5 to all lower extremity muscle groups bilaterally. Hammertoe(s) noted to the L 2nd toe and L 3rd toe.Marland Kitchen No pain, crepitus or joint limitation noted with ROM b/l LE.  Patient ambulates independently without assistive aids.   Radiographs: None   Assessment/Plan: 1. Pain due to onychomycosis of toenails of both feet   2. Corns   3. Porokeratosis   4. PAD (peripheral artery disease) ([place])     No orders of the defined types were placed in this encounter.   -Patient's family member present. All questions/concerns addressed on today's visit. -Mycotic toenails 1-5 bilaterally were debrided in length and girth with sterile nail nippers and dremel without incident. -Corn(s) L 3rd toe pared utilizing sterile scalpel blade without complication or incident. Total number debrided=1. -Porokeratotic lesion(s) plantar heel left foot, submet head 1 right foot, and submet head 2 left foot pared and enucleated with sterile currette without incident. Total number of lesions debrided=3. -Recommended Oofos Oocoozie shoe for shock absorption. -Patient/POA to call should there be question/concern in the interim.   Return in about 3 months (around 12/08/2022).  Marzetta Weber, DPM

## 2022-09-30 DIAGNOSIS — Z974 Presence of external hearing-aid: Secondary | ICD-10-CM | POA: Diagnosis not present

## 2022-09-30 DIAGNOSIS — H903 Sensorineural hearing loss, bilateral: Secondary | ICD-10-CM | POA: Diagnosis not present

## 2022-09-30 DIAGNOSIS — H6123 Impacted cerumen, bilateral: Secondary | ICD-10-CM | POA: Diagnosis not present

## 2022-12-09 ENCOUNTER — Ambulatory Visit: Payer: Medicare Other | Admitting: Podiatry

## 2022-12-09 ENCOUNTER — Encounter: Payer: Self-pay | Admitting: Podiatry

## 2022-12-09 VITALS — BP 122/73

## 2022-12-09 DIAGNOSIS — L84 Corns and callosities: Secondary | ICD-10-CM

## 2022-12-09 DIAGNOSIS — Q828 Other specified congenital malformations of skin: Secondary | ICD-10-CM | POA: Diagnosis not present

## 2022-12-09 DIAGNOSIS — I739 Peripheral vascular disease, unspecified: Secondary | ICD-10-CM

## 2022-12-09 DIAGNOSIS — B351 Tinea unguium: Secondary | ICD-10-CM | POA: Diagnosis not present

## 2022-12-09 DIAGNOSIS — M792 Neuralgia and neuritis, unspecified: Secondary | ICD-10-CM | POA: Diagnosis not present

## 2022-12-09 DIAGNOSIS — M79674 Pain in right toe(s): Secondary | ICD-10-CM

## 2022-12-09 DIAGNOSIS — M79675 Pain in left toe(s): Secondary | ICD-10-CM | POA: Diagnosis not present

## 2022-12-09 NOTE — Progress Notes (Signed)
  Subjective:  Patient ID: [name] Weber, female    DOB: 28-May-1931,  MRN: [id number]  [name] Weber presents to clinic today for at risk foot care. Patient has h/o PAD and painful porokeratotic lesion(s) b/l feet and painful mycotic toenails that limit ambulation. Painful toenails interfere with ambulation. Aggravating factors include wearing enclosed shoe gear. Pain is relieved with periodic professional debridement. Painful porokeratotic lesions are aggravated when weightbearing with and without shoegear. Pain is relieved with periodic professional debridement.   Her daughter is present during today's visit. States [name] Weber continues to complain of foot pain. She has h/o of chronic back pain, lumbar fx, pelvis fx, osteoporosis with pathological fx.  Chief Complaint  Patient presents with   Nail Problem    [facility] PCP-[name] Weber PCP [facility]-2 or 3 months ago   New problem(s): None.   PCP is [name] Huddle, MD.  Allergies  Allergen Reactions   Crestor [Rosuvastatin Calcium] Other (See Comments)    "bones ache all over"   Rosuvastatin Other (See Comments)     Bone pain all over body Other reaction(s): body aches   Ciprofloxacin     Other reaction(s): vomiting    Review of Systems: Negative except as noted in the HPI.  Objective: No changes noted in today's physical examination. Vitals:   12/09/22 1346  BP: 122/73   [name] Weber is a pleasant 89 y.o. female thin build in NAD. AAO x 3. Vascular CFT <3 seconds b/l LE. Faintly palpable DP pulses b/l. Nonpalpable PT pulses b/l. Pedal hair absent b/l. Skin temperature gradient WNL b/l. No pain with calf compression b/l. No edema b/l LE. No cyanosis or clubbing noted b/l LE.  Neurologic Normal speech. Pt has subjective symptoms of neuropathy. Protective sensation intact 5/5 intact bilaterally with 10g monofilament b/l. Vibratory sensation intact b/l.  Dermatologic Pedal skin thin, shiny and atrophic b/l LE. No open wounds b/l. No  interdigital macerations. Toenails 1-5 b/l elongated, thickened, discolored with subungual debris. +Tenderness with dorsal palpation of nailplates.   Porokeratotic lesion(s) distal tip left 2nd toe, submet head 2 left foot and plantar heel left foot .  Orthopedic: Normal muscle strength 5/5 to all lower extremity muscle groups bilaterally. Hammertoe(s) noted to the L 2nd toe and L 3rd toe.[name] Weber Kitchen No pain, crepitus or joint limitation noted with ROM b/l LE.  Patient ambulates independently without assistive aids.   Radiographs: None   Assessment/Plan: 1. Pain due to onychomycosis of toenails of both feet   2. Corns   3. Porokeratosis   4. Neuropathic pain   5. PAD (peripheral artery disease) ([place])     No orders of the defined types were placed in this encounter.  -Patient's family member present. All questions/concerns addressed on today's visit. -For neuropathic pain, recommended Capsaicin or Nervive Cream to be applied per package instructions. -Toenails 1-5 b/l were debrided in length and girth with sterile nail nippers and dremel without iatrogenic bleeding.  -Porokeratotic lesion(s) left heel, L 2nd toe, and submet head 2 left foot pared and enucleated with sterile currette without incident. Total number of lesions debrided=3. -Patient/POA to call should there be question/concern in the interim.   Return in about 3 months (around 03/10/2023).  Marzetta Board, DPM

## 2023-01-29 DEATH — deceased

## 2023-03-22 ENCOUNTER — Ambulatory Visit: Payer: Medicare Other | Admitting: Podiatry
